# Patient Record
Sex: Female | Born: 1940
Health system: Southern US, Community
[De-identification: ages and names within clinical notes are randomized; demographics above are authoritative.]

## PROBLEM LIST (undated history)

## (undated) DIAGNOSIS — T4145XA Adverse effect of unspecified anesthetic, initial encounter: Secondary | ICD-10-CM

## (undated) DIAGNOSIS — E669 Obesity, unspecified: Secondary | ICD-10-CM

## (undated) DIAGNOSIS — G7000 Myasthenia gravis without (acute) exacerbation: Secondary | ICD-10-CM

## (undated) DIAGNOSIS — M179 Osteoarthritis of knee, unspecified: Secondary | ICD-10-CM

## (undated) DIAGNOSIS — Z8701 Personal history of pneumonia (recurrent): Secondary | ICD-10-CM

## (undated) DIAGNOSIS — K589 Irritable bowel syndrome without diarrhea: Secondary | ICD-10-CM

## (undated) DIAGNOSIS — C755 Malignant neoplasm of aortic body and other paraganglia: Secondary | ICD-10-CM

## (undated) DIAGNOSIS — E782 Mixed hyperlipidemia: Secondary | ICD-10-CM

## (undated) DIAGNOSIS — D447 Neoplasm of uncertain behavior of aortic body and other paraganglia: Secondary | ICD-10-CM

## (undated) DIAGNOSIS — K5790 Diverticulosis of intestine, part unspecified, without perforation or abscess without bleeding: Secondary | ICD-10-CM

## (undated) DIAGNOSIS — I10 Essential (primary) hypertension: Secondary | ICD-10-CM

## (undated) DIAGNOSIS — E039 Hypothyroidism, unspecified: Secondary | ICD-10-CM

## (undated) DIAGNOSIS — C4490 Unspecified malignant neoplasm of skin, unspecified: Secondary | ICD-10-CM

## (undated) DIAGNOSIS — I480 Paroxysmal atrial fibrillation: Secondary | ICD-10-CM

## (undated) DIAGNOSIS — G43909 Migraine, unspecified, not intractable, without status migrainosus: Secondary | ICD-10-CM

## (undated) DIAGNOSIS — M549 Dorsalgia, unspecified: Secondary | ICD-10-CM

## (undated) DIAGNOSIS — M1710 Unilateral primary osteoarthritis, unspecified knee: Secondary | ICD-10-CM

## (undated) DIAGNOSIS — N289 Disorder of kidney and ureter, unspecified: Secondary | ICD-10-CM

## (undated) DIAGNOSIS — Z950 Presence of cardiac pacemaker: Secondary | ICD-10-CM

## (undated) DIAGNOSIS — R0600 Dyspnea, unspecified: Secondary | ICD-10-CM

## (undated) DIAGNOSIS — F418 Other specified anxiety disorders: Secondary | ICD-10-CM

## (undated) DIAGNOSIS — G8929 Other chronic pain: Secondary | ICD-10-CM

## (undated) DIAGNOSIS — D649 Anemia, unspecified: Secondary | ICD-10-CM

## (undated) DIAGNOSIS — K219 Gastro-esophageal reflux disease without esophagitis: Secondary | ICD-10-CM

## (undated) DIAGNOSIS — T8859XA Other complications of anesthesia, initial encounter: Secondary | ICD-10-CM

## (undated) DIAGNOSIS — R110 Nausea: Secondary | ICD-10-CM

## (undated) DIAGNOSIS — I4891 Unspecified atrial fibrillation: Secondary | ICD-10-CM

## (undated) DIAGNOSIS — I517 Cardiomegaly: Secondary | ICD-10-CM

## (undated) HISTORY — DX: Myasthenia gravis without (acute) exacerbation: G70.00

## (undated) HISTORY — DX: Cardiomegaly: I51.7

## (undated) HISTORY — DX: Unspecified atrial fibrillation: I48.91

## (undated) HISTORY — PX: KNEE SURGERY: SHX244

## (undated) HISTORY — DX: Osteoarthritis of knee, unspecified: M17.9

## (undated) HISTORY — PX: CHOLECYSTECTOMY: SHX55

## (undated) HISTORY — PX: ABDOMINAL HYSTERECTOMY: SUR658

## (undated) HISTORY — PX: TOTAL KNEE ARTHROPLASTY: SHX125

## (undated) HISTORY — DX: Irritable bowel syndrome, unspecified: K58.9

## (undated) HISTORY — DX: Migraine, unspecified, not intractable, without status migrainosus: G43.909

## (undated) HISTORY — DX: Mixed hyperlipidemia: E78.2

## (undated) HISTORY — PX: NASAL SINUS SURGERY: SHX719

## (undated) HISTORY — PX: ABDOMINAL HYSTERECTOMY: SHX81

## (undated) HISTORY — DX: Malignant neoplasm of aortic body and other paraganglia: C75.5

## (undated) HISTORY — DX: Hypothyroidism, unspecified: E03.9

## (undated) HISTORY — DX: Presence of cardiac pacemaker: Z95.0

## (undated) HISTORY — PX: CARDIAC CATHETERIZATION: SHX172

## (undated) HISTORY — DX: Unilateral primary osteoarthritis, unspecified knee: M17.10

---

## 1998-12-24 ENCOUNTER — Inpatient Hospital Stay (HOSPITAL_COMMUNITY): Admission: EM | Admit: 1998-12-24 | Discharge: 1998-12-25 | Payer: Self-pay | Admitting: *Deleted

## 2000-08-23 ENCOUNTER — Ambulatory Visit (HOSPITAL_COMMUNITY): Admission: RE | Admit: 2000-08-23 | Discharge: 2000-08-23 | Payer: Self-pay | Admitting: Pediatrics

## 2000-08-23 ENCOUNTER — Encounter: Payer: Self-pay | Admitting: Pediatrics

## 2000-10-01 ENCOUNTER — Ambulatory Visit (HOSPITAL_COMMUNITY): Admission: RE | Admit: 2000-10-01 | Discharge: 2000-10-01 | Payer: Self-pay | Admitting: Pediatrics

## 2000-10-18 ENCOUNTER — Encounter: Payer: Self-pay | Admitting: Pediatrics

## 2000-10-18 ENCOUNTER — Ambulatory Visit (HOSPITAL_COMMUNITY): Admission: RE | Admit: 2000-10-18 | Discharge: 2000-10-18 | Payer: Self-pay | Admitting: Pediatrics

## 2000-11-02 ENCOUNTER — Ambulatory Visit (HOSPITAL_COMMUNITY): Admission: RE | Admit: 2000-11-02 | Discharge: 2000-11-02 | Payer: Self-pay | Admitting: Orthopedic Surgery

## 2000-11-13 ENCOUNTER — Encounter (HOSPITAL_COMMUNITY): Admission: RE | Admit: 2000-11-13 | Discharge: 2000-12-13 | Payer: Self-pay | Admitting: Orthopedic Surgery

## 2000-12-14 ENCOUNTER — Encounter (HOSPITAL_COMMUNITY): Admission: RE | Admit: 2000-12-14 | Discharge: 2001-01-13 | Payer: Self-pay | Admitting: Orthopedic Surgery

## 2001-02-13 ENCOUNTER — Encounter: Payer: Self-pay | Admitting: Otolaryngology

## 2001-02-13 ENCOUNTER — Ambulatory Visit (HOSPITAL_COMMUNITY): Admission: RE | Admit: 2001-02-13 | Discharge: 2001-02-13 | Payer: Self-pay | Admitting: Otolaryngology

## 2002-09-19 ENCOUNTER — Ambulatory Visit (HOSPITAL_COMMUNITY): Admission: RE | Admit: 2002-09-19 | Discharge: 2002-09-19 | Payer: Self-pay | Admitting: Internal Medicine

## 2002-12-17 ENCOUNTER — Encounter: Payer: Self-pay | Admitting: Pediatrics

## 2002-12-17 ENCOUNTER — Ambulatory Visit (HOSPITAL_COMMUNITY): Admission: RE | Admit: 2002-12-17 | Discharge: 2002-12-17 | Payer: Self-pay | Admitting: Pediatrics

## 2003-05-29 ENCOUNTER — Ambulatory Visit (HOSPITAL_COMMUNITY): Admission: RE | Admit: 2003-05-29 | Discharge: 2003-05-29 | Payer: Self-pay | Admitting: Pediatrics

## 2003-09-16 ENCOUNTER — Inpatient Hospital Stay (HOSPITAL_COMMUNITY): Admission: RE | Admit: 2003-09-16 | Discharge: 2003-09-18 | Payer: Self-pay | Admitting: Orthopedic Surgery

## 2003-09-18 ENCOUNTER — Inpatient Hospital Stay (HOSPITAL_COMMUNITY)
Admission: RE | Admit: 2003-09-18 | Discharge: 2003-09-26 | Payer: Self-pay | Admitting: Physical Medicine & Rehabilitation

## 2003-10-06 ENCOUNTER — Inpatient Hospital Stay (HOSPITAL_COMMUNITY): Admission: RE | Admit: 2003-10-06 | Discharge: 2003-10-07 | Payer: Self-pay | Admitting: Pediatrics

## 2003-10-30 ENCOUNTER — Ambulatory Visit (HOSPITAL_COMMUNITY): Admission: RE | Admit: 2003-10-30 | Discharge: 2003-10-30 | Payer: Self-pay | Admitting: Pediatrics

## 2004-02-16 ENCOUNTER — Ambulatory Visit (HOSPITAL_COMMUNITY): Admission: RE | Admit: 2004-02-16 | Discharge: 2004-02-16 | Payer: Self-pay | Admitting: Family Medicine

## 2004-07-21 ENCOUNTER — Ambulatory Visit (HOSPITAL_COMMUNITY): Admission: RE | Admit: 2004-07-21 | Discharge: 2004-07-21 | Payer: Self-pay | Admitting: Pediatrics

## 2005-01-10 ENCOUNTER — Ambulatory Visit (HOSPITAL_COMMUNITY): Admission: RE | Admit: 2005-01-10 | Discharge: 2005-01-10 | Payer: Self-pay | Admitting: Family Medicine

## 2007-11-20 ENCOUNTER — Ambulatory Visit (HOSPITAL_COMMUNITY): Admission: RE | Admit: 2007-11-20 | Discharge: 2007-11-20 | Payer: Self-pay | Admitting: Pediatrics

## 2009-07-15 ENCOUNTER — Ambulatory Visit (HOSPITAL_COMMUNITY): Admission: RE | Admit: 2009-07-15 | Discharge: 2009-07-15 | Payer: Self-pay | Admitting: Pediatrics

## 2010-01-17 ENCOUNTER — Inpatient Hospital Stay (HOSPITAL_COMMUNITY): Admission: RE | Admit: 2010-01-17 | Discharge: 2010-01-22 | Payer: Self-pay | Admitting: Orthopedic Surgery

## 2010-05-31 LAB — CBC
HCT: 27.6 % — ABNORMAL LOW (ref 36.0–46.0)
Hemoglobin: 8.7 g/dL — ABNORMAL LOW (ref 12.0–15.0)
Hemoglobin: 9.8 g/dL — ABNORMAL LOW (ref 12.0–15.0)
MCH: 33 pg (ref 26.0–34.0)
MCH: 33 pg (ref 26.0–34.0)
MCHC: 34.3 g/dL (ref 30.0–36.0)
MCHC: 34.4 g/dL (ref 30.0–36.0)
MCHC: 34.6 g/dL (ref 30.0–36.0)
Platelets: 138 10*3/uL — ABNORMAL LOW (ref 150–400)
Platelets: 151 10*3/uL (ref 150–400)
RDW: 13 % (ref 11.5–15.5)
WBC: 11.1 10*3/uL — ABNORMAL HIGH (ref 4.0–10.5)
WBC: 8.2 10*3/uL (ref 4.0–10.5)

## 2010-05-31 LAB — PROTIME-INR
INR: 1.12 (ref 0.00–1.49)
INR: 1.85 — ABNORMAL HIGH (ref 0.00–1.49)
INR: 2.28 — ABNORMAL HIGH (ref 0.00–1.49)
Prothrombin Time: 14.6 seconds (ref 11.6–15.2)
Prothrombin Time: 17.6 seconds — ABNORMAL HIGH (ref 11.6–15.2)
Prothrombin Time: 21.5 seconds — ABNORMAL HIGH (ref 11.6–15.2)
Prothrombin Time: 25.3 seconds — ABNORMAL HIGH (ref 11.6–15.2)
Prothrombin Time: 25.4 seconds — ABNORMAL HIGH (ref 11.6–15.2)

## 2010-05-31 LAB — BASIC METABOLIC PANEL
BUN: 13 mg/dL (ref 6–23)
CO2: 27 mEq/L (ref 19–32)
Chloride: 107 mEq/L (ref 96–112)
Creatinine, Ser: 1.03 mg/dL (ref 0.4–1.2)
GFR calc Af Amer: >60 mL/min (ref >60)
Glucose, Bld: 133 mg/dL — ABNORMAL HIGH (ref 70–99)
Potassium: 4.2 mEq/L (ref 3.5–5.1)
Potassium: 5 mEq/L (ref 3.5–5.1)
Sodium: 138 mEq/L (ref 135–145)

## 2010-06-01 LAB — CBC
MCH: 32.6 pg (ref 26.0–34.0)
Platelets: 169 10*3/uL (ref 150–400)
RBC: 3.73 MIL/uL — ABNORMAL LOW (ref 3.87–5.11)
WBC: 5.7 10*3/uL (ref 4.0–10.5)

## 2010-06-01 LAB — COMPREHENSIVE METABOLIC PANEL
Albumin: 3.5 g/dL (ref 3.5–5.2)
CO2: 25 mEq/L (ref 19–32)
GFR calc Af Amer: 54 mL/min — ABNORMAL LOW (ref >60)
Glucose, Bld: 95 mg/dL (ref 70–99)
Potassium: 5 mEq/L (ref 3.5–5.1)
Sodium: 138 mEq/L (ref 135–145)

## 2010-06-01 LAB — URINALYSIS, ROUTINE W REFLEX MICROSCOPIC
Glucose, UA: NEGATIVE mg/dL
Hgb urine dipstick: NEGATIVE
Ketones, ur: NEGATIVE mg/dL
Protein, ur: NEGATIVE mg/dL

## 2010-06-01 LAB — SURGICAL PCR SCREEN: MRSA, PCR: NEGATIVE

## 2010-06-01 LAB — TYPE AND SCREEN: Antibody Screen: NEGATIVE

## 2010-06-01 LAB — PROTIME-INR: INR: 0.99 (ref 0.00–1.49)

## 2010-07-26 ENCOUNTER — Other Ambulatory Visit: Payer: Self-pay | Admitting: Family Medicine

## 2010-08-05 NOTE — Op Note (Signed)
NAMEPICCOLA, ARICO                           ACCOUNT NO.:  [id number]   MEDICAL RECORD NO.:  [id number]                   PATIENT TYPE:  INP   LOCATION:  0007                                 FACILITY:  [facility]   PHYSICIAN:  Ollen Gross, M.D.                 DATE OF BIRTH:  Aug 01, 1940   DATE OF PROCEDURE:  09/16/2003  DATE OF DISCHARGE:                                 OPERATIVE REPORT   PREOPERATIVE DIAGNOSIS:  Osteoarthritis of left knee.   POSTOPERATIVE DIAGNOSIS:  Osteoarthritis of left knee.   PROCEDURE:  Left total knee arthroplasty.   SURGEON:  Gus Rankin. Aluisio, M.D.   ASSISTANT:  Avel Peace, PA-C   ANESTHESIA:  General with postop Marcaine pain pump.   ESTIMATED BLOOD LOSS:  300.   DRAIN:  Hemovac x 1.   TOURNIQUET TIME:  40 minutes at 350 mmHg.   COMPLICATIONS:  None.   CONDITION:  Stable to recovery.   BRIEF CLINICAL NOTE:  [name] is a 70 year old female with end-stage  arthritis of the left knee with pain refractory to nonoperative management.  She presents now for left total knee arthroplasty.   PROCEDURE IN DETAIL:  After the successful administration of general  anesthetic, a tourniquet is placed high on the left thigh and left lower  extremity prepped and draped in the usual sterile fashion.  Extremity is  wrapped in Esmarch, knee flexed, tourniquet inflated to 350 mmHg.  Standard  midline incision is made with a 10 blade through subcutaneous tissue to the  level of the extensor mechanism.  A fresh blade is used to make a medial  parapatellar arthrotomy.  Then the soft tissue over the proximal and medial  tibia is subperiosteally elevated to the joint line with a knife and to the  semimembranous bursa with a curved osteotome.  Soft tissue over the proximal  and lateral tibia is elevated with attention being paid to avoid the  patellar tendon on tibial tubercle.  Patella is everted, knee flexed to 90  degrees, and ACL and PCL are removed.  Drill is  used to create a starting  hole in the distal femur and canal is irrigated.  A 5-degree left valgus  alignment guide is placed, then referencing off the posterior condyles,  rotation is marked and a block pinned to remove 10 mm off the distal femur.  Distal femoral resection is made with an oscillating saw.  Sizing block is  placed, and size 3 is most appropriate.  Rotation is marked off the  epicondylar axis.  The size 3 cutting block is placed and the anterior and  posterior cuts made.   The tibia is then subluxed forward, and the menisci are removed.  Extramedullary tibial alignment guide is placed referencing proximally at  the medial aspect of the tibial tubercle and distally along the second  metatarsal axis and tibial crest.  The block  is pinned to remove  approximately 6 mm off the more deficient lateral side.  Tibial resection is  made with an oscillating saw.  Size 3 is the most appropriate tibial  component, and then the proximal tibia is prepared with the modular drill  and keel punch.  Femoral preparation is completed with the intercondylar and  chamfer cuts.   The trial size 3 posterior stabilized femur, size 3 mobile bearing tibial  tray, and a 10 mm posterior stabilized rotating platform insert trial are  placed.  With the 10, full extension is achieved with excellent varus and  valgus balance throughout full range of motion.  Patella is then everted,  thickness measured to be 23 mm, free hand resection taken to 13 mm.  The 38  template placed, lug holes drilled, trial patella is placed, and it tracks  normally.  Osteophytes are then removed off the posterior femur with the  trials in place.  The cut bone surface is then prepared with pulsatile  lavage and cement mixed.  Once ready for implantation, the size 3 mobile  bearing tibial tray, size 3 posterior stabilized femur, and 38 patella are  cemented into place.  Patella is held with a clamp.  Trial 10 mm insert is   placed and knee held in full extension.  Once the cement is fully hardened,  then the permanent 10 mm posterior stabilized rotating platform insert is  placed into the tibial tray.  Once again, there is excellent varus and  valgus balance throughout full range of motion.  The wound is copiously  irrigated with antibiotic solution and the tourniquet released for a total  time of 40 minutes.  The extensor mechanism is closed over a Hemovac drain  with interrupted #1 PDS.  Flexion against gravity is 120 degrees.  Subcu is  closed with interrupted 2-0 Vicryl, subcuticular running 4-0 Monocryl.  The  catheter for the Marcaine pain pump is then placed.  The pain pump is  initiated.  Steri-Strips and a bulky sterile dressing are then applied.  Drain is hooked to suction.  She is placed into a knee immobilizer,  awakened, and transported to recovery in stable condition.                                               Ollen Gross, M.D.    FA/MEDQ  D:  09/16/2003  T:  09/16/2003  Job:  [id number]

## 2010-08-05 NOTE — H&P (Signed)
[name], [name]                           ACCOUNT NO.:  [id number]   MEDICAL RECORD NO.:  [id number]                   PATIENT TYPE:  INP   LOCATION:  0455                                 FACILITY:  [facility]   PHYSICIAN:  Ollen Gross, M.D.                 DATE OF BIRTH:  10-Mar-1941   DATE OF ADMISSION:  09/16/2003  DATE OF DISCHARGE:  09/18/2003                                HISTORY & PHYSICAL   CHIEF COMPLAINT:  Left knee pain.   HISTORY OF PRESENT ILLNESS:  The patient is a 70 year old female seen for  bilateral knee pain.  The left is actually more symptomatic than the right.  She had a long history of bilateral knee pain.  It has been ongoing for  about three years now.  Her knees have reached a point where she is hurting  all the time.  She even has pain at night.  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  She  is seen in the office by Dr. Lequita Halt, and found severe end-stage  tricompartmental arthritis of the left knee with moderate to advanced  changes on the right knee.  She has reached a point now where she would like  to have something done about it.  Risks and benefits have been discussed  with the patient, it was felt like she would benefit from undergoing a knee  replacement.  She is subsequently admitted to the hospital.   ALLERGIES:  1. CODEINE causes a rash.  2. TYLOX causes nausea and vomiting.  3. TETRACYCLINE.  4. BIAXIN.  5. AMPICILLIN.  6. MACRODANTIN.  7. TOBRAMYCIN.   PAST MEDICAL HISTORY:  1. History of migraines.  2. Hypertension.  3. Reflux disease.  4. Gastritis.  5. Hypothyroidism.  6. History of skin cancer.  7. Degenerative joint disease with history of ruptured disk at L4-5.  8. Postmenopausal.  9. History of uterine fibroids.   PAST SURGICAL HISTORY:  1. Hysterectomy.  2. Skin cancer surgery at  [facility].  3. Cholecystectomy.  4. Left knee arthroscopy.   SOCIAL HISTORY:  Married, Designer, jewellery, nonsmoker, rare intake of wine,  has four children.   FAMILY HISTORY:  Father with a history of heart disease, hypertension, and  arthritis.  Mother with a history of hypertension and stroke.   REVIEW OF SYSTEMS:  GENERAL:  No fevers, chills, or night sweats.  NEUROLOGIC:  No seizures, syncope, or paralysis.  RESPIRATORY:  No shortness  of breath, productive cough, or hemoptysis.  CARDIOVASCULAR:  A little bit  of shortness of breath with exertion, no shortness of breath at rest, no  chest pain, angina.  GASTROINTESTINAL:  No nausea, vomiting, diarrhea, or  constipation.  GENITOURINARY:  No dysuria, hematuria, or discharge.  MUSCULOSKELETAL:  Pertinent  to that of the knees found in the history of  present illness.   PHYSICAL EXAMINATION:  VITAL SIGNS:  Pulse 60, respirations 14, blood  pressure 124/85.  GENERAL:  The patient is a 70 year old female, well-developed, well-  nourished, overweight.  She is alert and oriented and cooperative.  HEENT:  Normocephalic, atraumatic.  Pupils equal, round, reactive to light.  EOM's intact.  NECK:  Supple.  CHEST:  Clear to auscultation anterior and posterior chest walls.  No  wheezes, rhonchi, or rales.  HEART:  Regular rhythm, distant sounds, S1 and S2 noted.  ABDOMEN:  Round, protuberant abdomen, bowel sounds are present.  RECTAL:  Not done, not pertinent to present illness.  BREASTS:  Not done, not pertinent to present illness.  GENITALIA:  Not done, not pertinent to present illness.  EXTREMITIES:  Left knee shows a slight varus mal-alignment deformity, range  of motion of 5 to 100 degrees.  Marked crepitus on passive range of motion.  Right knee shows similar examination with range of motion of 5 to 120  degrees with marked crepitus noted.   IMPRESSION:  Osteoarthritis, left knee.   PLAN:  The patient will be admitted to [facility] to undergo a  left total knee replacement arthroplasty.  Surgery will be performed by Dr.  Ollen Gross.     Alexzandrew L. Julien Girt, P.A.              Ollen Gross, M.D.    ALP/MEDQ  D:  10/18/2003  T:  10/18/2003  Job:  [id number]

## 2010-08-05 NOTE — Discharge Summary (Signed)
NAMEIDOLINA, MANTELL                           ACCOUNT NO.:  [id number]   MEDICAL RECORD NO.:  [id number]                   PATIENT TYPE:  INP   LOCATION:  0455                                 FACILITY:  [facility]   PHYSICIAN:  Ollen Gross, M.D.                 DATE OF BIRTH:  January 22, 1941   DATE OF ADMISSION:  09/16/2003  DATE OF DISCHARGE:  09/18/2003                                 DISCHARGE SUMMARY   ADMISSION DIAGNOSES:  1. Osteoarthritis, left knee.  2. History of migraines.  3. Hypertension.  4. Reflux disease.  5. Gastritis.  6. Hypothyroidism.  7. History of skin cancer.  8. Degenerative disk disease with a history of ruptured disk at L4-5.  9. Postmenopausal.  10.      History of uterine fibroids.   DISCHARGE DIAGNOSES:  1. Osteoarthritis, left knee, status post left total knee arthroplasty.  2. History of migraines.  3. Hypertension.  4. Reflux disease.  5. Gastritis.  6. Hypothyroidism.  7. History of skin cancer.  8. Degenerative disk disease with a history of ruptured disk at L4-5.  9. Postmenopausal.  10.      History of uterine fibroids.   REASON FOR ADMISSION:  On the day of surgery, September 16, 2003, left total  knee.  Surgeon was Dr. Ollen Gross, assistant Avel Peace, P.A.-C.  Anesthesia general.  Postoperative Marcaine pain pump.  Estimated blood loss  300 cc.  Hemovac drain x1.  Tourniquet time 40 minutes at 350 mmHg.   HOSPITAL COURSE:  [name] is a 70 year old female with end-stage arthritis  of the left knee.  Pain has been refractory to non-operative management, and  she now presents for total knee arthroplasty.   LABORATORY DATA:  CBC on admission:  Hemoglobin of 14.4, hematocrit of 41.4,  white blood cells 5.7, red blood cell count 4.2, differential within normal  limits.  Postoperative H&H 11.7 and 34.5, last noted H&H 10.3 and 29.8.  PT  and PTT preoperatively were 11.7 and 27, respectively, with an INR of 0.8.  serial prothrombin time's were  followed.  Last noted PT and INR were 15.2,  and 1.3.  Chem panel on admission:  Sodium 134, low, otherwise chem panel  within normal limits.  Serial BMET's were followed.  Electrolytes remained  within normal limits.  Glucose went from 87 to 205, back down to 118.  Urinalysis preoperatively was negative.  Blood group type O positive.  Preoperative EKG dated September 09, 2003, normal sinus rhythm, with occasional  premature ectopic complexes, nonspecific T-wave abnormalities.  The anterior  ST-T abnormality is new since previous tracing.  The inferior ST and T  abnormality has improved, confirmed by Dr. Laqueta Carina.  Preoperative  chest x-ray dated September 09, 2003, no evidence of acute cardiopulmonary  disease.   HOSPITAL COURSE:  The patient was admitted to [facility], taken to  the  OR, underwent the above stated procedure without complications.  The  patient tolerated the procedure well, later transferred to the recovery room  then to the orthopaedic floor for continued postoperative care.  Vital signs  were followed.  Twenty-four postoperative IV antibiotics in the form of  Ancef.  Coumadin for three weeks.  Started on PCA and p.o. analgesic for  pain control following surgery.  Started back on home medications.  Placed  weightbearing as tolerated.  PT and OT were consulted to assist with gait  training, ambulation, and ADL's.  On day #1, had a fairly decent night after  surgery.  Hemovac drain was pulled without difficulty.  Fluids were reduced.  Rehab services were consulted.  The patient was seen in consultation by Dr.  Ellwood Dense and felt to be an appropriate candidate for inpatient stay.  As such, she would be transferred at which time a bed became available.  Continued to receive therapy.  By day #2, she was already starting to get up  a little bit with physical therapy.  Hemoglobin had stabilized.  Dressing  was changed.  Incision was healing well.  PCA and IV's and  Foley's were  discontinued.  It was noted that a bed became available on rehab later that  day, on postoperative day #2.  The patient was doing well, hemoglobin  stable, and she was transferred over to [facility] for continued therapy.   PLAN:  The patient is discharged to [facility].   DISCHARGE DIAGNOSES:  Please see above.   DISCHARGE MEDICATIONS:  1. Percocet for pain.  2. Robaxin for spasm.  3. Coumadin as per pharmacy protocol.  4. Continue her home medications.  MAR to be sent over.   DIET:  As tolerated.  Low sodium diet.   ACTIVITY:  Weightbearing as tolerated.  Continue with gait training,  ambulation, and ADL's as per PT and OT on rehab services for total knee  protocol.   WOUND CARE:  Daily dressing changes.  May start showering after four days  postoperatively.   FOLLOWUP:  Two weeks from the date of surgery or following discharge from  the rehab unit.   DISPOSITION:  [facility].   CONDITION ON DISCHARGE:  Improved.     Alexzandrew L. Julien Girt, P.A.              Ollen Gross, M.D.    ALP/MEDQ  D:  10/21/2003  T:  10/21/2003  Job:  [id number]

## 2010-08-05 NOTE — Discharge Summary (Signed)
[name], [name]                           ACCOUNT NO.:  [id number]   MEDICAL RECORD NO.:  [id number]                   PATIENT TYPE:  IPS   LOCATION:  4147                                 FACILITY:  [facility]   PHYSICIAN:  Ellwood Dense, M.D.                DATE OF BIRTH:  02/12/1941   DATE OF ADMISSION:  09/18/2003  DATE OF DISCHARGE:  09/26/2003                                 DISCHARGE SUMMARY   DISCHARGE DIAGNOSES:  1. Left total knee replacement secondary to degenerative joint disease.  2. Pain management.  3. Coumadin for deep venous thrombosis prophylaxis.  4. Hypertension.  5. History of migraine headaches.  6. Hypothyroidism.  7. Gastroesophageal reflux disease.   HISTORY OF PRESENT ILLNESS:  [age]-year-old white female admitted to  [facility], September 16, 2003, with advanced left knee pain, x-rays  with advanced degenerative joint disease, and no relief with conservative  care.  Underwent a left total knee replacement, September 16, 2003, per Dr. Ollen Gross.  Placed on Coumadin for deep venous thrombosis prophylaxis,  weightbearing as tolerated, hospital course uneventful, pain controlled with  Demerol.  She was admitted for a comprehensive rehab program.   PAST MEDICAL HISTORY:  See discharge diagnoses.   SOCIAL HISTORY:  She lives with husband in [place], independent prior to  admission.  She is a Designer, jewellery at [employer].  One-level  home.  Husband and local family work.  Occasional alcohol.  No tobacco.   MEDICATIONS:  Medications prior to admission were Synthroid, lisinopril,  Inderal and Os-Cal.   ALLERGIES:  PENICILLIN, CODEINE, BIAXIN, TETRACYCLINE, TYLOX and  MACRODANTIN.   HOSPITAL COURSE:  Patient with progressive gains while on rehab services  with therapies initiated on a b.i.d. basis.  The following issues were  followed during the patient's rehab course:  Pertaining to [name]'s left  total knee replacement, surgical  site healing nicely, Steri-Strips in place.  She was supervision-ambulation with a walker, range of motion 88 degrees.  Home health therapies per [facility]; home health services have been arranged.  Pain control with the use of OxyContin sustained release 10 mg, which was  tapered at time of discharge, as well as oxycodone for breakthrough pain.  She remained on Coumadin for deep venous thrombosis prophylaxis, venous  Doppler studies negative, latest INR of 1.9.  [facility] will  continue to check blood-thinning times with results to Dr. Francoise Schaumann. [place],  [phone], fax [phone]; message had been left with his nurse Hilda Lias at time  of discharge.  Her blood pressure remained controlled on home regimens of  lisinopril and Inderal.  She had no bowel or bladder disturbances.  She  continued on her hormone supplement for hypothyroidism.  At time of  discharge, she was close-supervision transfers, ambulating with supervision  75 feet, able to do 1 step at modified independence and a range of motion  on  September 24, 2003 was 102 degrees.  Home health therapies, as noted above, would  be arranged.   Latest labs showed an INR of 1.9, hemoglobin 9.3, hematocrit 26; sodium 135,  potassium 4.2, BUN 15, creatinine 1.0.   DISCHARGE MEDICATIONS:  At time of discharge, medications included:  1. Coumadin with latest dose of 7.5 mg, adjusted accordingly, to be     completed on October 16, 2003.  2. Lisinopril 40 mg daily.  3. Protonix 40 mg daily.  4. Inderal 120 mg daily.  5. Synthroid 125 mcg daily.  6. OxyContin sustained release 10 mg every 12 hours x1 week and discontinue.  7. Trinsicon 1 capsule twice daily.  8. Oxycodone as needed -- pain.   ACTIVITY:  Activity as tolerated.   DIET:  Regular.   WOUND CARE:  Cleanse incision daily with warm water and soap.   SPECIAL INSTRUCTIONS:  Home health nurse per [facility] to check INR  on Monday, September 28, 2003, results to Dr. Vivia Ewing,  [phone], fax [phone].      [name], P.A.                     Ellwood Dense, M.D.    DA/MEDQ  D:  09/24/2003  T:  09/25/2003  Job:  [id number]   cc:   Ellwood Dense, M.D.  [address] [place]  [place] [address]  Fax: [phone]   Ollen Gross, M.D.  [facility]  [address]  [address]  [place]  [place] [address]  Fax: [phone]   Francoise Schaumann. Halm, D.O.  [address], [address]  [place]  [place] [address]  Fax: [phone]

## 2010-08-05 NOTE — H&P (Signed)
[name], [name]                           ACCOUNT NO.:  [id number]   MEDICAL RECORD NO.:  [id number]                   PATIENT TYPE:  INP   LOCATION:  A216                                 FACILITY:  [facility]   PHYSICIAN:  Francoise Schaumann. Halm, D.O.                DATE OF BIRTH:  12/17/1940   DATE OF ADMISSION:  10/06/2003  DATE OF DISCHARGE:                                HISTORY & PHYSICAL   CHIEF COMPLAINT:  Shortness of breath.   BRIEF HISTORY:  The patient is a 70 year old female who is three weeks  status post knee replacement surgery that had been uncomplicated.  He  presents to my office with a one-day history of progressive shortness of  breath and dyspnea on exertion.  She was seen by her orthopedist earlier  today and was advised to see her primary care doctor within the next day.  We saw her early in the afternoon and she appeared to be in moderate  distress with tachypnea, tachycardia, and generalized malaise.  She denies  any significant fever, but has had a couple of episodes of chills and some  very mild nonproductive cough.   I was initially concerned about a pulmonary embolus given her postoperative  status and the immobility of her left knee.  We obtained this stat spiral CT  this afternoon, which did not show any evidence of a pulmonary embolus.  She  is admitted to the hospital for further workup of her tachypnea and  difficulties, as well as oxygen therapy and IV fluids.   PAST MEDICAL HISTORY:  Significant for:  1. Hypothyroidism.  2. Gastrointestinal reflux.  3. Migraine headaches.  4. Hypertension.  5. Fibromyalgia.   ALLERGIES:  1. AMPICILLIN.  2. TETRACYCLINE.  3. MACRODANTIN.  4. BIAXIN.   MEDICATIONS:  1. Inderal LA 120 mg (brand name only) for migraine prevention.  2. Monopril either 20 or 40 mg p.o. daily.  3. Prevacid 30 mg daily.  4. Synthroid 125 mcg daily.  5. Darvocet p.r.n.  6. Flexeril p.r.n.  7. Coumadin 7.5 mg alternating with 5 mg  over the last three weeks     postoperatively.   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  She denies any alcohol or tobacco  use.   FAMILY HISTORY:  Noncontributory.   REVIEW OF SYSTEMS:  The patient denies any significant joint pain.  She had  chronic muscle pains and aches.  She has had a good recovery from her knee  replacement surgery and making good progress in physical therapy.  She has  had no complications from her surgery.  She has a history of degenerative  arthritis, which has progressed especially in the knees.  She has been  fatigued over the last 24 hours and is very easily winded.  Her migraines  have been  well controlled with brand name Inderal.  Her GI reflux symptoms  have been stable.  She has been constipated while she was previously on  oxycodone, but this has improved since she has stopped.   PHYSICAL EXAMINATION:  VITAL SIGNS:  Vital signs were obtained.  The patient  is tachycardic and mildly tachypneic.  GENERAL APPEARANCE:  She appears to be a little bit more comfortable in the  bed at the hospital.  She has oxygen running via nasal cannula.  NECK:  Significant for a normal thyroid and no evidence of JVD.  HEART:  Regular with no ectopy and no murmur.  LUNGS:  Clear anteriorly.  She has bibasilar crackles, which are rather  fine, a little more on her right side.  ABDOMEN:  She has no flank pain.  Her abdomen is soft and nontender.  EXTREMITIES:  No significant edema.  Moderate tenderness to deep palpation  of the muscles and subcutaneous tissues.  She has a very long anterior scar  across her knee, which appears to be healing nicely with no signs of  infection.  GYNECOLOGICAL:  Exam is deferred at this time.  BREASTS:  Exam is deferred at this time.   IMPRESSION AND PLAN:  1. Postoperative dyspnea.  Concern initially was with pulmonary embolus, but     this has been ruled out.   Remaining in the differential includes     pneumonia, atelectasis, or myocardial infarction.  Our plan will be to     admit to the hospital, provide IV fluids, obtain a chest x-ray, start her     on empiric antibiotics, and follow serial cardiac enzymes.  2. History of arthritis, which is stable.  3. History of gastrointestinal reflux, which is stable.  4. History of hypertension, which is stable.   The overall care plan has been reviewed with [name] and she is in  agreement.     ___________________________________________                                         Francoise Schaumann. Halm, D.O.   [facility]/MEDQ  D:  10/06/2003  T:  10/06/2003  Job:  [id number]

## 2010-08-05 NOTE — Op Note (Signed)
[name], [name]                           ACCOUNT NO.:  [id number]   MEDICAL RECORD NO.:  [id number]                   PATIENT TYPE:  AMB   LOCATION:  DAY                                  FACILITY:  [facility]   PHYSICIAN:  Lionel December, M.D.                 DATE OF BIRTH:  02/19/1941   DATE OF PROCEDURE:  09/19/2002  DATE OF DISCHARGE:                                 OPERATIVE REPORT   PROCEDURE:  Total colonoscopy.   INDICATIONS:  [name] is a 70 year old Caucasian female who had rectal  bleeding about 12 days ago which lasted two days.  She had half cupful of  bleeding on at least two different occasions associated with cramps and self-  limiting diarrhea.  She was on Celebrex which was discontinued.  She is  undergoing diagnostic colonoscopy.  Her last colonoscopy was in 1996 and she  removal of a small polyp which is hyperplastic.  Procedure was reviewed and  informed consent was obtained.   PREOPERATIVE MEDICATIONS:  Demerol 50 mg IV, Versed 6 mg IV in divided  doses.   FINDINGS:  The procedure was performed in the endoscopy suite.  The  patient's vital signs and O2 saturation were monitored during the procedure  and remained normal.  The patient was placed in the left lateral recumbent  position.  Rectal examination was performed.  No abnormalities were noted on  the external or digital exam.  Scope was placed in the rectum and advanced  under vision into the sigmoid colon and beyond.  Sigmoid colon was somewhat  redundant.  Preparation was satisfactory.  The scope was passed into the  cecum which was identified by the ileocecal valve and appendiceal orifice.  As the scope was withdrawn, colonic mucosa was once again carefully  examined.  There were a few small diverticula at the left sigmoid colon.  The mucosa was normal throughout.  Rectal mucosa was normal.  Scope was  retroflexed and examined the anorectal junction which was unremarkable.  Endoscope was then withdrawn.   The patient tolerated the procedure well.   FINAL DIAGNOSES:  A few small diverticula at the sigmoid colon; otherwise  normal colonoscopy.  I suspect she could have mild self-limiting colitis to  explain her symptoms which have resolved by now.   RECOMMENDATIONS:  1. High fiber diet.  2. She can resume her Celebrex but at a lower dose if possible.  If she has     another episode of bleeding, will consider small bowel follow-through.                                               Lionel December, M.D.    NR/MEDQ  D:  09/19/2002  T:  09/19/2002  Job:  [id number]

## 2010-10-19 ENCOUNTER — Encounter (INDEPENDENT_AMBULATORY_CARE_PROVIDER_SITE_OTHER): Payer: Self-pay

## 2010-11-16 ENCOUNTER — Encounter (INDEPENDENT_AMBULATORY_CARE_PROVIDER_SITE_OTHER): Payer: Self-pay | Admitting: Internal Medicine

## 2010-11-16 ENCOUNTER — Ambulatory Visit (INDEPENDENT_AMBULATORY_CARE_PROVIDER_SITE_OTHER): Payer: Medicare Other | Admitting: Internal Medicine

## 2010-11-16 VITALS — BP 140/80 | HR 82 | Temp 98.2°F | Ht 65.5 in | Wt 221.3 lb

## 2010-11-16 DIAGNOSIS — R1310 Dysphagia, unspecified: Secondary | ICD-10-CM

## 2010-11-16 DIAGNOSIS — R1314 Dysphagia, pharyngoesophageal phase: Secondary | ICD-10-CM

## 2010-11-16 NOTE — Patient Instructions (Signed)
Soft foods. Will schedule a Modified Barium Swallow

## 2010-11-16 NOTE — Progress Notes (Signed)
Subjective:     Patient ID: [name], female   DOB: January 23, 1941, 70 y.o.   MRN: [id number]  HPI  [name] is a referral from Dr. Webb Laws for liquid dysphagia.  She is not having sold food dysphagia.  She is choking on liquids about twice a day.  She has to cough when this occurs.  Symptoms for 2-3 yrs. Appetite good.  She does not have any trouble eating chicken or meats.  Acid reflux is for the most part controlled with omeprazole.  Weight loss of 50 lbs intentionally. Lower abdominal pain/cramps when she has IBS.  She has a BM about one every other day.   She does tell me she has a hiatal hernia.  Her last colonoscopy was in July of 2004 which revealed a few small diverticula at the sigmoid colon; otherwise normal colonoscopy.  Knee replacement in 2005 and one in 2011 (Both knees)  Past Medical History  Diagnosis Date  . Unspecified essential hypertension   . Mixed hyperlipidemia   . Generalized anxiety disorder   . Obesity, unspecified   . Esophageal reflux   . Hypertension     over 10 yrs  . Hypothyroidism   . Migraines   . Osteoarthritis of knee     both knees  . IBS (irritable colon syndrome)    Past Surgical History  Procedure Date  . Knee surgery   . Total knee arthroplasty     both knee. 2005 1st, 2011 the last one  . Abdominal hysterectomy   . Cholecystectomy   . Nasal sinus surgery     30 yrs ago      Past Surgical History  Procedure Date  . Knee surgery   . Total knee arthroplasty     both knee. 2005 1st, 2011 the last one  . Abdominal hysterectomy   . Cholecystectomy   . Nasal sinus surgery     30 yrs ago   Allergies as of 11/16/2010 - Review Complete 11/16/2010  Allergen Reaction Noted  . Ampicillin  11/16/2010  . Biaxin  11/16/2010  . Clarithromycin  10/19/2010  . Nitrofurantoin  10/19/2010  . Tetracyclines & related  10/19/2010  . Penicillins Rash 10/19/2010   Allergies  Allergen Reactions  . Ampicillin   . Biaxin   . Clarithromycin   .  Nitrofurantoin   . Tetracyclines & Related   . Penicillins Rash    Family History  Problem Relation Age of Onset  . Inflammatory bowel disease Maternal Grandmother    History   Social History  . Marital Status: Divorced    Spouse Name: N/A    Number of Children: N/A  . Years of Education: N/A   Occupational History  . Not on file.   Social History Main Topics  . Smoking status: Never Smoker   . Smokeless tobacco: Not on file  . Alcohol Use: Yes     one glass wine two to three times a year  . Drug Use: No  . Sexually Active: Not on file   Other Topics Concern  . Not on file   Social History Narrative  . No narrative on file   History   Social History Narrative  . No narrative on file   Family Status  Relation Status Death Age  . Mother Deceased     CVA  . Father Deceased     MI  . Sister Alive     good health  . Child Alive  Two have DM, One good health, One has had 2 strokes and an MI.  One has had 3 strokes.     Review of Systems  See hpi.     Objective:   Physical ExamBlood pressure 140/80, pulse 82, temperature 98.2 F (36.8 C), height 5' 5.5" (1.664 m), weight 221 lb 4.8 oz (100.381 kg).  Alert and oriented. Skin warm and dry. Oral mucosa is moist. Natural teeth in good condition. Sclera anicteric, conjunctivae is pink. Thyroid not enlarged. No cervical lymphadenopathy. Lungs clear. Heart regular rate and rhythm.  Abdomen is soft. Bowel sounds are positive. No hepatomegaly. Abdomen obese. No abdominal masses felt. No tenderness.  No edema to lower extremities. Patient is alert and oriented.     Assessment:    Dysphagia to liquids.  Esophgeal motily disorder needs to be ruled out.    Plan:    Modified barium swallow. Further recommendations once we have the test back I talk with Dr. Karilyn Cota.

## 2010-12-20 ENCOUNTER — Other Ambulatory Visit (INDEPENDENT_AMBULATORY_CARE_PROVIDER_SITE_OTHER): Payer: Self-pay | Admitting: Internal Medicine

## 2010-12-22 ENCOUNTER — Other Ambulatory Visit (INDEPENDENT_AMBULATORY_CARE_PROVIDER_SITE_OTHER): Payer: Self-pay | Admitting: *Deleted

## 2010-12-22 ENCOUNTER — Ambulatory Visit (HOSPITAL_COMMUNITY): Payer: Medicare Other

## 2010-12-22 MED ORDER — OMEPRAZOLE 40 MG PO CPDR: 40.0000 mg | DELAYED_RELEASE_CAPSULE | Freq: Two times a day (BID) | ORAL | Status: DC

## 2010-12-22 NOTE — Telephone Encounter (Signed)
Raul Del called in reference to her Mom.  She had gotten a call earlier and her mom does not have voice mail.  You can try to call her at [phone] or [name]

## 2010-12-27 ENCOUNTER — Ambulatory Visit (HOSPITAL_COMMUNITY): Payer: Medicare Other

## 2010-12-27 ENCOUNTER — Ambulatory Visit (HOSPITAL_COMMUNITY): Payer: Medicare Other | Admitting: Speech Pathology

## 2011-01-03 ENCOUNTER — Other Ambulatory Visit (INDEPENDENT_AMBULATORY_CARE_PROVIDER_SITE_OTHER): Payer: Self-pay | Admitting: Internal Medicine

## 2011-01-09 ENCOUNTER — Telehealth (INDEPENDENT_AMBULATORY_CARE_PROVIDER_SITE_OTHER): Payer: Self-pay | Admitting: *Deleted

## 2011-01-09 NOTE — Telephone Encounter (Signed)
She called Friday at 3:06 pm  She has a question about the number on her bottle for the amount on her RX

## 2011-02-14 ENCOUNTER — Other Ambulatory Visit (HOSPITAL_COMMUNITY): Payer: Self-pay | Admitting: Pediatrics

## 2011-02-14 DIAGNOSIS — Z139 Encounter for screening, unspecified: Secondary | ICD-10-CM

## 2011-02-16 ENCOUNTER — Ambulatory Visit (HOSPITAL_COMMUNITY): Payer: Medicare Other

## 2011-02-17 ENCOUNTER — Ambulatory Visit (HOSPITAL_COMMUNITY)
Admission: RE | Admit: 2011-02-17 | Discharge: 2011-02-17 | Disposition: A | Discharge status: Home / Self Care | Payer: Medicare Other | Source: Ambulatory Visit | Attending: Pediatrics | Admitting: Pediatrics

## 2011-02-17 DIAGNOSIS — Z139 Encounter for screening, unspecified: Secondary | ICD-10-CM

## 2011-02-17 DIAGNOSIS — Z1231 Encounter for screening mammogram for malignant neoplasm of breast: Secondary | ICD-10-CM | POA: Insufficient documentation

## 2011-03-24 ENCOUNTER — Other Ambulatory Visit (HOSPITAL_COMMUNITY): Payer: Self-pay | Admitting: Pediatrics

## 2011-03-24 DIAGNOSIS — E559 Vitamin D deficiency, unspecified: Secondary | ICD-10-CM | POA: Diagnosis not present

## 2011-03-24 DIAGNOSIS — I10 Essential (primary) hypertension: Secondary | ICD-10-CM | POA: Diagnosis not present

## 2011-03-24 DIAGNOSIS — M810 Age-related osteoporosis without current pathological fracture: Secondary | ICD-10-CM | POA: Diagnosis not present

## 2011-03-24 DIAGNOSIS — E785 Hyperlipidemia, unspecified: Secondary | ICD-10-CM | POA: Diagnosis not present

## 2011-03-24 DIAGNOSIS — Z23 Encounter for immunization: Secondary | ICD-10-CM | POA: Diagnosis not present

## 2011-03-29 ENCOUNTER — Ambulatory Visit (HOSPITAL_COMMUNITY)
Admission: RE | Admit: 2011-03-29 | Discharge: 2011-03-29 | Disposition: A | Discharge status: Home / Self Care | Payer: Medicare Other | Source: Ambulatory Visit | Attending: Pediatrics | Admitting: Pediatrics

## 2011-03-29 DIAGNOSIS — Z780 Asymptomatic menopausal state: Secondary | ICD-10-CM | POA: Diagnosis not present

## 2011-03-29 DIAGNOSIS — M810 Age-related osteoporosis without current pathological fracture: Secondary | ICD-10-CM | POA: Diagnosis not present

## 2011-03-31 DIAGNOSIS — Z23 Encounter for immunization: Secondary | ICD-10-CM | POA: Diagnosis not present

## 2011-07-05 DIAGNOSIS — H538 Other visual disturbances: Secondary | ICD-10-CM | POA: Diagnosis not present

## 2011-07-05 DIAGNOSIS — H532 Diplopia: Secondary | ICD-10-CM | POA: Diagnosis not present

## 2011-07-05 DIAGNOSIS — H2510 Age-related nuclear cataract, unspecified eye: Secondary | ICD-10-CM | POA: Diagnosis not present

## 2011-07-21 DIAGNOSIS — R634 Abnormal weight loss: Secondary | ICD-10-CM | POA: Diagnosis not present

## 2011-07-21 DIAGNOSIS — G8929 Other chronic pain: Secondary | ICD-10-CM | POA: Diagnosis not present

## 2011-07-26 ENCOUNTER — Encounter (INDEPENDENT_AMBULATORY_CARE_PROVIDER_SITE_OTHER): Payer: Self-pay | Admitting: Internal Medicine

## 2011-07-26 ENCOUNTER — Telehealth (INDEPENDENT_AMBULATORY_CARE_PROVIDER_SITE_OTHER): Payer: Self-pay | Admitting: *Deleted

## 2011-07-26 ENCOUNTER — Ambulatory Visit (INDEPENDENT_AMBULATORY_CARE_PROVIDER_SITE_OTHER): Payer: Medicare Other | Admitting: Internal Medicine

## 2011-07-26 ENCOUNTER — Other Ambulatory Visit (INDEPENDENT_AMBULATORY_CARE_PROVIDER_SITE_OTHER): Payer: Self-pay | Admitting: *Deleted

## 2011-07-26 VITALS — BP 148/98 | HR 64 | Temp 98.4°F | Ht 64.0 in | Wt 206.9 lb

## 2011-07-26 DIAGNOSIS — K625 Hemorrhage of anus and rectum: Secondary | ICD-10-CM

## 2011-07-26 DIAGNOSIS — E7800 Pure hypercholesterolemia, unspecified: Secondary | ICD-10-CM

## 2011-07-26 DIAGNOSIS — M1990 Unspecified osteoarthritis, unspecified site: Secondary | ICD-10-CM

## 2011-07-26 DIAGNOSIS — M129 Arthropathy, unspecified: Secondary | ICD-10-CM

## 2011-07-26 DIAGNOSIS — K219 Gastro-esophageal reflux disease without esophagitis: Secondary | ICD-10-CM

## 2011-07-26 DIAGNOSIS — E039 Hypothyroidism, unspecified: Secondary | ICD-10-CM | POA: Diagnosis not present

## 2011-07-26 DIAGNOSIS — I10 Essential (primary) hypertension: Secondary | ICD-10-CM | POA: Diagnosis not present

## 2011-07-26 DIAGNOSIS — K589 Irritable bowel syndrome without diarrhea: Secondary | ICD-10-CM

## 2011-07-26 DIAGNOSIS — Z1211 Encounter for screening for malignant neoplasm of colon: Secondary | ICD-10-CM

## 2011-07-26 NOTE — Progress Notes (Signed)
Subjective:     Patient ID: [name], female   DOB: Oct 05, 1940, 71 y.o.   MRN: [id number]  HPI Louis is a 71 yr old female presenting today with c/o rectal bleeding. She tells me she has had abdominal pain for a while. She says she has had really bad abdominal cramps.  She started having diarrhea Saturday. She says the diarrhea was explosive.  She also said she passed mucous, gas and blood. She took Imodium and the diarrhea stopped. The diarrhea lasted about 12 hrs. She continues to have abdominal cramps. Her stools now are orange in color now and has seen red streaks on the toilet tissue. She has had intentional weight loss. Her appetite is good. She is trying to diet.  She has lower abdominal cramps. Now she is having 2 a day, formed. Stools are smaller than usual. She also c/o abdominal distention  Her colonoscopy was greater than 10 yrs ago.    Review of Systems see hpi Current Outpatient Prescriptions  Medication Sig Dispense Refill  . ALPRAZolam (XANAX) 1 MG tablet Take 1 mg by mouth as needed.        . beta carotene w/minerals (OCUVITE) tablet Take 1 tablet by mouth daily.        . bisacodyl (DULCOLAX) 5 MG EC tablet Take 5 mg by mouth daily as needed.        . calcium carbonate (OS-CAL) 600 MG TABS Take 600 mg by mouth 2 (two) times daily with a meal.        . cholecalciferol (VITAMIN D) 1000 UNITS tablet Take 1,000 Units by mouth daily.       . cyclobenzaprine (FLEXERIL) 10 MG tablet Take 10 mg by mouth as needed.       . diclofenac (VOLTAREN) 50 MG EC tablet Take 50 mg by mouth 2 (two) times daily.        Marland Kitchen dicyclomine (BENTYL) 10 MG capsule Take 10 mg by mouth as needed.        . fexofenadine (ALLEGRA) 180 MG tablet Take 180 mg by mouth as needed.       Marland Kitchen glucosamine-chondroitin 500-400 MG tablet Take 1 tablet by mouth 3 (three) times daily.        Marland Kitchen HYDROcodone-acetaminophen (NORCO) 10-325 MG per tablet Take 1 tablet by mouth every 6 (six) hours as needed.        .  lansoprazole (PREVACID) 30 MG capsule Take 30 mg by mouth 2 (two) times daily before a meal.      . levothyroxine (SYNTHROID, LEVOTHROID) 100 MCG tablet Take 100 mcg by mouth daily.        Marland Kitchen lubiprostone (AMITIZA) 24 MCG capsule Take 24 mcg by mouth 2 (two) times daily with a meal.      . Omega-3 Fatty Acids (FISH OIL) 1200 MG CAPS Take by mouth 2 (two) times daily before a meal.       . pravastatin (PRAVACHOL) 80 MG tablet Take 40 mg by mouth daily.       . propranolol (INDERAL) 40 MG tablet Take 40 mg by mouth 3 (three) times daily.        . SUMAtriptan (IMITREX) 50 MG tablet Take 50 mg by mouth every 2 (two) hours as needed.        . vitamin B-12 (CYANOCOBALAMIN) 500 MCG tablet Take 500 mcg by mouth daily.       Marland Kitchen aspirin 81 MG tablet Take 81 mg by mouth daily.        Marland Kitchen  diphenhydrAMINE (SOMINEX) 25 MG tablet Take 25 mg by mouth as needed.        . Ginkgo Biloba 120 MG CAPS Take by mouth.        Marland Kitchen omeprazole (PRILOSEC) 40 MG capsule TAKE 1 CAPSULE TWICE DAILY BEFORE MEALS  30 capsule  11   Past Medical History  Diagnosis Date  . Unspecified essential hypertension   . Mixed hyperlipidemia   . Generalized anxiety disorder   . Obesity, unspecified   . Esophageal reflux   . Hypertension     over 10 yrs  . Hypothyroidism   . Migraines   . Osteoarthritis of knee     both knees  . IBS (irritable colon syndrome)    Past Surgical History  Procedure Date  . Knee surgery   . Total knee arthroplasty     both knee. 2005 1st, 2011 the last one  . Abdominal hysterectomy   . Cholecystectomy   . Nasal sinus surgery     30 yrs ago   History   Social History  . Marital Status: Divorced    Spouse Name: N/A    Number of Children: N/A  . Years of Education: N/A   Occupational History  . Not on file.   Social History Main Topics  . Smoking status: Never Smoker   . Smokeless tobacco: Not on file  . Alcohol Use: Yes     one glass wine two to three times a year  . Drug Use: No  .  Sexually Active: Not on file   Other Topics Concern  . Not on file   Social History Narrative  . No narrative on file   Family Status  Relation Status Death Age  . Mother Deceased     CVA  . Father Deceased     MI  . Sister Alive     good health  . Child Alive     Two have DM, One good health, One has had 2 strokes and an MI.  One has had 3 strokes.   Allergies  Allergen Reactions  . Ampicillin   . Clarithromycin   . Clarithromycin   . Nitrofurantoin   . Tetracyclines & Related   . Penicillins Rash        Objective:   Physical Exam Filed Vitals:   07/26/11 1442  Height: 5\' 4"  (1.626 m)  Weight: 206 lb 14.4 oz (93.849 kg)   Alert and oriented. Skin warm and dry. Oral mucosa is moist.   . Sclera anicteric, conjunctivae is pink. Thyroid not enlarged. No cervical lymphadenopathy. Lungs clear. Heart regular rate and rhythm.  Abdomen is soft. Bowel sounds are positive. No hepatomegaly. No abdominal masses felt. No tenderness.  No edema to lower extremities. Patient is alert and oriented.      Assessment:    Rectal bleeding: which has now resolved. Colonic neoplasm needs to be ruled out as well as a colonic ulcer given her hx of daily ASA and Diclofenac.     Plan:    Colonoscopy with Dr Karilyn Cota.

## 2011-07-26 NOTE — Patient Instructions (Signed)
Reduce the Diclofenac to once a day. Colonoscopy with Dr Karilyn Cota.

## 2011-07-26 NOTE — Telephone Encounter (Signed)
Patient needs movi prep 

## 2011-07-28 MED ORDER — PEG-KCL-NACL-NASULF-NA ASC-C 100 G PO SOLR: 1.0000 | Freq: Once | ORAL | Status: DC

## 2011-08-01 ENCOUNTER — Encounter (INDEPENDENT_AMBULATORY_CARE_PROVIDER_SITE_OTHER): Payer: Self-pay | Admitting: *Deleted

## 2011-08-03 DIAGNOSIS — R109 Unspecified abdominal pain: Secondary | ICD-10-CM | POA: Diagnosis not present

## 2011-08-03 DIAGNOSIS — R197 Diarrhea, unspecified: Secondary | ICD-10-CM | POA: Diagnosis not present

## 2011-08-18 ENCOUNTER — Encounter (HOSPITAL_COMMUNITY): Payer: Self-pay | Admitting: Pharmacy Technician

## 2011-08-24 ENCOUNTER — Ambulatory Visit (HOSPITAL_COMMUNITY)
Admission: RE | Admit: 2011-08-24 | Discharge: 2011-08-24 | Disposition: A | Discharge status: Home / Self Care | Payer: Medicare Other | Source: Ambulatory Visit | Attending: Internal Medicine | Admitting: Internal Medicine

## 2011-08-24 ENCOUNTER — Encounter (HOSPITAL_COMMUNITY)
Admission: RE | Disposition: A | Discharge status: Home / Self Care | Payer: Self-pay | Source: Ambulatory Visit | Attending: Internal Medicine

## 2011-08-24 ENCOUNTER — Encounter (HOSPITAL_COMMUNITY): Payer: Self-pay | Admitting: *Deleted

## 2011-08-24 DIAGNOSIS — D126 Benign neoplasm of colon, unspecified: Secondary | ICD-10-CM | POA: Diagnosis not present

## 2011-08-24 DIAGNOSIS — E669 Obesity, unspecified: Secondary | ICD-10-CM | POA: Insufficient documentation

## 2011-08-24 DIAGNOSIS — K625 Hemorrhage of anus and rectum: Secondary | ICD-10-CM | POA: Insufficient documentation

## 2011-08-24 DIAGNOSIS — K5730 Diverticulosis of large intestine without perforation or abscess without bleeding: Secondary | ICD-10-CM

## 2011-08-24 DIAGNOSIS — E785 Hyperlipidemia, unspecified: Secondary | ICD-10-CM | POA: Diagnosis not present

## 2011-08-24 DIAGNOSIS — I10 Essential (primary) hypertension: Secondary | ICD-10-CM | POA: Insufficient documentation

## 2011-08-24 HISTORY — DX: Other complications of anesthesia, initial encounter: T88.59XA

## 2011-08-24 HISTORY — DX: Adverse effect of unspecified anesthetic, initial encounter: T41.45XA

## 2011-08-24 HISTORY — PX: COLONOSCOPY: SHX5424

## 2011-08-24 SURGERY — COLONOSCOPY
Anesthesia: Moderate Sedation

## 2011-08-24 MED ORDER — MIDAZOLAM HCL 5 MG/5ML IJ SOLN
INTRAMUSCULAR | Status: AC
  Filled 2011-08-24: qty 10

## 2011-08-24 MED ORDER — MEPERIDINE HCL 50 MG/ML IJ SOLN
INTRAMUSCULAR | Status: DC | PRN
Start: 2011-08-24 — End: 2011-08-24
  Administered 2011-08-24 (×3): 25 mg via INTRAVENOUS

## 2011-08-24 MED ORDER — MEPERIDINE HCL 50 MG/ML IJ SOLN
INTRAMUSCULAR | Status: AC
  Filled 2011-08-24: qty 1

## 2011-08-24 MED ORDER — SODIUM CHLORIDE 0.45 % IV SOLN
Freq: Once | INTRAVENOUS | Status: AC
  Administered 2011-08-24: 1000 mL via INTRAVENOUS

## 2011-08-24 MED ORDER — MIDAZOLAM HCL 5 MG/5ML IJ SOLN
INTRAMUSCULAR | Status: DC
Start: 2011-08-24 — End: 2011-08-24
  Filled 2011-08-24: qty 5

## 2011-08-24 MED ORDER — STERILE WATER FOR IRRIGATION IR SOLN
Status: DC | PRN
  Administered 2011-08-24: 14:00:00

## 2011-08-24 MED ORDER — MIDAZOLAM HCL 5 MG/5ML IJ SOLN
INTRAMUSCULAR | Status: DC | PRN
  Administered 2011-08-24 (×6): 2 mg via INTRAVENOUS

## 2011-08-24 NOTE — H&P (Signed)
[name] is an 71 y.o. female.   Chief Complaint: Patient is here for colonoscopy. HPI: Patient is 71 year old Caucasian female who had one day of rectal bleeding associated with diarrhea bloating and cramps. She has history of IBS. Lately she's been constipated and has good results with Amitiza. She she has cut back on diclofenac to once a day she cannot move around the morning and that she takes second dose in the evening. Patient's last colonoscopy was in July 2004 revealing few diverticula at sigmoid colon. Family  history is negative for colorectal carcinoma.  Past Medical History  Diagnosis Date  . Unspecified essential hypertension   . Mixed hyperlipidemia   . Obesity, unspecified   . Esophageal reflux   . Hypertension     over 10 yrs  . Hypothyroidism   . Migraines   . Osteoarthritis of knee     both knees  . IBS (irritable colon syndrome)   . Complication of anesthesia     Low blood pressure, heart rate, O2 sat    Past Surgical History  Procedure Date  . Knee surgery   . Total knee arthroplasty     both knee. 2005 1st, 2011 the last one  . Abdominal hysterectomy   . Cholecystectomy   . Nasal sinus surgery     30 yrs ago    Family History  Problem Relation Age of Onset  . Inflammatory bowel disease Maternal Grandmother    Social History:  reports that she has never smoked. She does not have any smokeless tobacco history on file. She reports that she drinks alcohol. She reports that she does not use illicit drugs.  Allergies:  Allergies  Allergen Reactions  . Clarithromycin Nausea And Vomiting  . Clarithromycin   . Cod (Fish Allergy)   . Codeine     Can not take Codeine unless in cough syrup  . Nitrofurantoin Nausea And Vomiting  . Tetracyclines & Related Nausea And Vomiting  . Ampicillin Rash  . Penicillins Rash    Medications Prior to Admission  Medication Sig Dispense Refill  . ALPRAZolam (XANAX) 1 MG tablet Take 1 mg by mouth 2 (two) times  daily.       Marland Kitchen aspirin 81 MG tablet Take 81 mg by mouth daily.        . beta carotene w/minerals (OCUVITE) tablet Take 1 tablet by mouth daily.        . calcium carbonate (OS-CAL) 600 MG TABS Take 600 mg by mouth 2 (two) times daily with a meal.        . Cholecalciferol (VITAMIN D3) 3000 UNITS TABS Take 3,000 Units by mouth daily.      . cyclobenzaprine (FLEXERIL) 10 MG tablet Take 10 mg by mouth at bedtime as needed. Muscle spasm      . diclofenac (VOLTAREN) 50 MG EC tablet Take 50 mg by mouth 2 (two) times daily.        Marland Kitchen dicyclomine (BENTYL) 10 MG capsule Take 10 mg by mouth as needed. IBS      . glucosamine-chondroitin 500-400 MG tablet Take 1 tablet by mouth 3 (three) times daily.        Marland Kitchen HYDROcodone-acetaminophen (NORCO) 10-325 MG per tablet Take 1 tablet by mouth every 6 (six) hours as needed. pain      . lansoprazole (PREVACID) 30 MG capsule Take 30 mg by mouth 2 (two) times daily before a meal.      . levothyroxine (SYNTHROID, LEVOTHROID) 100 MCG tablet  Take 100 mcg by mouth daily.        Marland Kitchen lubiprostone (AMITIZA) 8 MCG capsule Take 8 mcg by mouth 2 (two) times daily.      . peg 3350 powder (MOVIPREP) 100 G SOLR Take 1 kit (100 g total) by mouth once.  1 kit  0  . pravastatin (PRAVACHOL) 80 MG tablet Take 40 mg by mouth daily.       . propranolol (INDERAL) 40 MG tablet Take 40 mg by mouth 2 (two) times daily.       Marland Kitchen pyridostigmine (MESTINON) 60 MG tablet Take 60 mg by mouth 3 (three) times daily.      . SUMAtriptan (IMITREX) 50 MG tablet Take 50 mg by mouth every 2 (two) hours as needed. headaches      . vitamin B-12 (CYANOCOBALAMIN) 500 MCG tablet Take 500 mcg by mouth daily.       . bisacodyl (DULCOLAX) 5 MG EC tablet Take 5 mg by mouth daily as needed. constipation      . Ginkgo Biloba 120 MG CAPS Take 120 mg by mouth daily.       . Omega-3 Fatty Acids (FISH OIL) 1200 MG CAPS Take by mouth 2 (two) times daily before a meal.       . omeprazole (PRILOSEC) 40 MG capsule TAKE 1 CAPSULE  TWICE DAILY BEFORE MEALS  30 capsule  11    No results found for this or any previous visit (from the past 48 hour(s)). No results found.  ROS  Blood pressure 137/74, pulse 57, temperature 97.7 F (36.5 C), temperature source Oral, resp. rate 20, SpO2 100.00%. Physical Exam  Constitutional: She appears well-developed and well-nourished.  HENT:  Mouth/Throat: Oropharynx is clear and moist.  Eyes: Conjunctivae are normal. No scleral icterus.  Neck: No thyromegaly present.  Cardiovascular: Normal rate, regular rhythm and normal heart sounds.   No murmur heard. Respiratory: Effort normal and breath sounds normal.  GI: Soft. She exhibits no distension and no mass. There is no tenderness.  Musculoskeletal: She exhibits no edema.  Lymphadenopathy:    She has no cervical adenopathy.  Neurological: She is alert.  Skin: Skin is warm.     Assessment/Plan Rectal bleeding. Diagnostic colonoscopy.  Berkleigh Beckles U 08/24/2011, 1:50 PM

## 2011-08-24 NOTE — Discharge Instructions (Signed)
Resume usual medications. High fiber diet. No driving for 24 hours. Physician will contact you with biopsy results.   Colonoscopy Care After Read the instructions outlined below and refer to this sheet in the next few weeks. These discharge instructions provide you with general information on caring for yourself after you leave the hospital. Your doctor may also give you specific instructions. While your treatment has been planned according to the most current medical practices available, unavoidable complications occasionally occur. If you have any problems or questions after discharge, call your doctor. HOME CARE INSTRUCTIONS ACTIVITY:  You may resume your regular activity, but move at a slower pace for the next 24 hours.   Take frequent rest periods for the next 24 hours.   Walking will help get rid of the air and reduce the bloated feeling in your belly (abdomen).   No driving for 24 hours (because of the medicine (anesthesia) used during the test).   You may shower.   Do not sign any important legal documents or operate any machinery for 24 hours (because of the anesthesia used during the test).  NUTRITION:  Drink plenty of fluids.   You may resume your normal diet as instructed by your doctor.   Begin with a light meal and progress to your normal diet. Heavy or fried foods are harder to digest and may make you feel sick to your stomach (nauseated).   Avoid alcoholic beverages for 24 hours or as instructed.  MEDICATIONS:  You may resume your normal medications unless your doctor tells you otherwise.  WHAT TO EXPECT TODAY:  Some feelings of bloating in the abdomen.   Passage of more gas than usual.   Spotting of blood in your stool or on the toilet paper.  IF YOU HAD POLYPS REMOVED DURING THE COLONOSCOPY:  No aspirin products for 7 days or as instructed.   No alcohol for 7 days or as instructed.   Eat a soft diet for the next 24 hours.  FINDING OUT THE RESULTS OF  YOUR TEST Not all test results are available during your visit. If your test results are not back during the visit, make an appointment with your caregiver to find out the results. Do not assume everything is normal if you have not heard from your caregiver or the medical facility. It is important for you to follow up on all of your test results.  SEEK IMMEDIATE MEDICAL CARE IF:  You have more than a spotting of blood in your stool.   Your belly is swollen (abdominal distention).   You are nauseated or vomiting.   You have a fever.   You have abdominal pain or discomfort that is severe or gets worse throughout the day.  Document Released: 10/19/2003 Document Revised: 02/23/2011 Document Reviewed: 10/17/2007 [facility] Patient Information 2012 [place], [place].    PATIENT INSTRUCTIONS POST-ANESTHESIA  IMMEDIATELY FOLLOWING SURGERY:  Do not drive or operate machinery for the first twenty four hours after surgery.  Do not make any important decisions for twenty four hours after surgery or while taking narcotic pain medications or sedatives.  If you develop intractable nausea and vomiting or a severe headache please notify your doctor immediately.  FOLLOW-UP:  Please make an appointment with your surgeon as instructed. You do not need to follow up with anesthesia unless specifically instructed to do so.  WOUND CARE INSTRUCTIONS (if applicable):  Keep a dry clean dressing on the anesthesia/puncture wound site if there is drainage.  Once the wound has  quit draining you may leave it open to air.  Generally you should leave the bandage intact for twenty four hours unless there is drainage.  If the epidural site drains for more than 36-48 hours please call the anesthesia department.  QUESTIONS?:  Please feel free to call your physician or the hospital operator if you have any questions, and they will be happy to assist you.

## 2011-08-24 NOTE — Op Note (Signed)
COLONOSCOPY PROCEDURE REPORT  PATIENT:  [name]  MR#:  [id number] Birthdate:  [birth year], 71 y.o., female Endoscopist:  Dr. Malissa Hippo, MD Referred By:  Dr. Vivia Ewing, MD Procedure Date: 08/24/2011  Procedure:   Colonoscopy  Indications: Patient is 71 year old Caucasian female with multiple medical problems who is on chronic NSAID therapy experienced single day of rectal bleeding which has now resolved. Patient's last colonoscopy was in July 2004.  Informed Consent:  The procedure and risks were reviewed with the patient and informed consent was obtained.  Medications:  Demerol 75 mg IV Versed 12 mg IV  Description of procedure:  After a digital rectal exam was performed, that colonoscope was advanced from the anus through the rectum and colon to the area of the cecum, ileocecal valve and appendiceal orifice. The cecum was deeply intubated. These structures were well-seen and photographed for the record. From the level of the cecum and ileocecal valve, the scope was slowly and cautiously withdrawn. The mucosal surfaces were carefully surveyed utilizing scope tip to flexion to facilitate fold flattening as needed. The scope was pulled down into the rectum where a thorough exam including retroflexion was performed.  Findings:   Prep excellent. Moderate number of diverticula at sigmoid colon. Two small polyps ablated via cold biopsy. These are submitted together. One was located at splenic flexure and the second one at transverse colon. Normal rectal mucosa and anorectal junction  Therapeutic/Diagnostic Maneuvers Performed:  See  Complications:  None  Cecal Withdrawal Time:  8 minutes  Impression:  Examination performed to cecum. Sigmoid colon diverticulosis. Two small polyps ablated via cold biopsy and submitted together(splenic flexure and transverse colon).  Recommendations:  Standard instructions given. I will contact patient with biopsy results.  Debora Stockdale U   08/24/2011 2:43 PM  CC: Dr. Vivia Ewing, MD, MD & Dr. Bonnetta Barry ref. provider found

## 2011-08-29 ENCOUNTER — Ambulatory Visit (INDEPENDENT_AMBULATORY_CARE_PROVIDER_SITE_OTHER): Payer: Medicare Other | Admitting: Internal Medicine

## 2011-08-29 ENCOUNTER — Encounter (HOSPITAL_COMMUNITY): Payer: Self-pay | Admitting: Internal Medicine

## 2011-08-30 DIAGNOSIS — G7000 Myasthenia gravis without (acute) exacerbation: Secondary | ICD-10-CM | POA: Diagnosis not present

## 2011-08-30 DIAGNOSIS — H532 Diplopia: Secondary | ICD-10-CM | POA: Diagnosis not present

## 2011-09-04 ENCOUNTER — Encounter (INDEPENDENT_AMBULATORY_CARE_PROVIDER_SITE_OTHER): Payer: Self-pay | Admitting: *Deleted

## 2011-09-26 ENCOUNTER — Encounter (INDEPENDENT_AMBULATORY_CARE_PROVIDER_SITE_OTHER): Payer: Self-pay | Admitting: Internal Medicine

## 2011-09-26 ENCOUNTER — Ambulatory Visit (INDEPENDENT_AMBULATORY_CARE_PROVIDER_SITE_OTHER): Payer: Medicare Other | Admitting: Internal Medicine

## 2011-09-26 VITALS — BP 120/80 | HR 68 | Temp 97.7°F | Resp 20 | Ht 64.0 in | Wt 207.3 lb

## 2011-09-26 DIAGNOSIS — R141 Gas pain: Secondary | ICD-10-CM | POA: Diagnosis not present

## 2011-09-26 DIAGNOSIS — R1031 Right lower quadrant pain: Secondary | ICD-10-CM

## 2011-09-26 DIAGNOSIS — R140 Abdominal distension (gaseous): Secondary | ICD-10-CM

## 2011-09-26 DIAGNOSIS — R143 Flatulence: Secondary | ICD-10-CM

## 2011-09-26 DIAGNOSIS — Z0189 Encounter for other specified special examinations: Secondary | ICD-10-CM | POA: Diagnosis not present

## 2011-09-26 DIAGNOSIS — R1032 Left lower quadrant pain: Secondary | ICD-10-CM | POA: Insufficient documentation

## 2011-09-26 MED ORDER — ALIGN PO CAPS: 1.0000 | ORAL_CAPSULE | Freq: Every day | ORAL | Status: AC

## 2011-09-26 MED ORDER — DOCUSATE SODIUM 100 MG PO CAPS: 100.0000 mg | ORAL_CAPSULE | Freq: Two times a day (BID) | ORAL | Status: AC

## 2011-09-26 MED ORDER — DICYCLOMINE HCL 10 MG PO CAPS: 10.0000 mg | ORAL_CAPSULE | Freq: Two times a day (BID) | ORAL | Status: DC

## 2011-09-26 NOTE — Patient Instructions (Addendum)
Try to take 1 dose of diclofenac daily if possible. Discontinue Amitiza. Colace(stool softener) 1 capsule by mouth twice daily. Continue fiber supplement 3-4 g daily. Align 1 capsule by mouth daily Take dicyclomine 10 mg by mouth before breakfast and lunch. Can use glycerin or Dulcolax suppository on as-needed basis. Physician will contact you with results of CT when completed.

## 2011-09-26 NOTE — Progress Notes (Signed)
Presenting complaint;  Lower abdominal pain, bloating and urge to have a bowel movement.  Subjective:  Patient is 71 year old Caucasian female who was evaluated last month for a single episode of rectal bleeding. She underwent colonoscopy on 08/24/2011 and noted to have moderate number of diverticula and sigmoid colon. She had 2 small polyps ablated via cold biopsy and these were non-adenomatous. Now she presents with 4 week history of intermittent lower abdominal pain, bloating and constant urge to have a bowel movement. She usually has formed stool in the morning followed by loose stool associated with cramping. The rest of the day she has an urge to have a bowel movement. He also complains of flatulence intermittent cramping across the lower abdomen more on the left size. She denies nausea vomiting or anorexia. Her appetite is fair. She has lost 70 pounds over the last 2 years. She believes all of this weight loss is voluntary. She exercises in the pool for 2 hours 5 days each week and she also does stationary bike for 5 hours per week. She says she eats a lot of fresh fruits and vegetables and now also taking Metamucil. She has an appointment with her gynecologist in near future.  Current Medications: Current Outpatient Prescriptions  Medication Sig Dispense Refill  . ALPRAZolam (XANAX) 1 MG tablet Take 1 mg by mouth 2 (two) times daily.       Marland Kitchen aspirin 81 MG tablet Take 81 mg by mouth daily.        . beta carotene w/minerals (OCUVITE) tablet Take 1 tablet by mouth daily.        . calcium carbonate (OS-CAL) 600 MG TABS Take 600 mg by mouth 2 (two) times daily with a meal.        . Cholecalciferol (VITAMIN D3) 3000 UNITS TABS Take 3,000 Units by mouth daily.      . diclofenac (VOLTAREN) 50 MG EC tablet Take 50 mg by mouth 2 (two) times daily.        Marland Kitchen dicyclomine (BENTYL) 10 MG capsule Take 10 mg by mouth as needed. IBS      . glucosamine-chondroitin 500-400 MG tablet Take 1 tablet by mouth 2  (two) times daily.       Marland Kitchen HYDROcodone-acetaminophen (NORCO) 10-325 MG per tablet Take 1 tablet by mouth every 6 (six) hours as needed. pain      . lansoprazole (PREVACID) 30 MG capsule Take 30 mg by mouth 2 (two) times daily before a meal.      . levothyroxine (SYNTHROID, LEVOTHROID) 100 MCG tablet Take 100 mcg by mouth daily.        Marland Kitchen lubiprostone (AMITIZA) 8 MCG capsule Take 8 mcg by mouth 2 (two) times daily.      . Omega-3 Fatty Acids (FISH OIL) 1200 MG CAPS Take by mouth 2 (two) times daily before a meal.       . pravastatin (PRAVACHOL) 80 MG tablet Take 40 mg by mouth daily.       . propranolol (INDERAL) 40 MG tablet Take 40 mg by mouth 2 (two) times daily.       Marland Kitchen pyridostigmine (MESTINON) 60 MG tablet Take 60 mg by mouth 3 (three) times daily.      . SUMAtriptan (IMITREX) 50 MG tablet Take 50 mg by mouth every 2 (two) hours as needed. headaches      . vitamin B-12 (CYANOCOBALAMIN) 500 MCG tablet Take 500 mcg by mouth daily.  Objective: Blood pressure 120/80, pulse 68, temperature 97.7 F (36.5 C), temperature source Oral, resp. rate 20, height 5\' 4"  (1.626 m), weight 207 lb 4.8 oz (94.031 kg). Patient is alert and in no acute distress Conjunctiva is pink. Sclera is nonicteric Oropharyngeal mucosa is normal. No neck masses or thyromegaly noted. Cardiac exam with regular rhythm normal S1 and S2. No murmur or gallop noted. Lungs are clear to auscultation. Abdomen is full. Bowel sounds are normal. Abdomen is soft with mild tenderness in LLQ and hypogastric area. No organomegaly or masses noted. No LE edema or clubbing noted.   Assessment:  Patient has multiple GI symptoms including lower abdominal pain bloating constant urge to have a bowel movement. Her weight loss possibly is voluntary. Some of her abdominal pain may be related to her exercises. Constant urge to have a bowel movement maybe secondary  to Amitiza. She may also have low-grade diverticulitis secondary to  NSAID therapy. Pelvic exam needs to be done to make sure she does not have gynecologic problems and she does not have gynecologic disease she already has an appointment with her gynecologist.   Plan:  Discontinue Amitiza. Take dicyclomine 10 mg by mouth twice a day rather than on when necessary basis. Colace 100 mg by mouth twice a day. Continue fiber supplementation 4 g by mouth daily. Align and one capsule by mouth daily. Can use glycerin or Dulcolax suppository on when necessary basis. Take one dose of Diclofenac for the next two weeks or so. Abdomino-pelvic CT with contrast. Office visit in 2 months.

## 2011-09-27 LAB — CREATININE, SERUM: Creat: 1.18 mg/dL — ABNORMAL HIGH (ref 0.50–1.10)

## 2011-09-29 ENCOUNTER — Ambulatory Visit (HOSPITAL_COMMUNITY)
Admission: RE | Admit: 2011-09-29 | Discharge: 2011-09-29 | Disposition: A | Discharge status: Home / Self Care | Payer: Medicare Other | Source: Ambulatory Visit | Attending: Internal Medicine | Admitting: Internal Medicine

## 2011-09-29 DIAGNOSIS — R599 Enlarged lymph nodes, unspecified: Secondary | ICD-10-CM | POA: Diagnosis not present

## 2011-09-29 DIAGNOSIS — R140 Abdominal distension (gaseous): Secondary | ICD-10-CM

## 2011-09-29 DIAGNOSIS — R1032 Left lower quadrant pain: Secondary | ICD-10-CM | POA: Diagnosis not present

## 2011-09-29 DIAGNOSIS — K5730 Diverticulosis of large intestine without perforation or abscess without bleeding: Secondary | ICD-10-CM | POA: Insufficient documentation

## 2011-09-29 DIAGNOSIS — R1031 Right lower quadrant pain: Secondary | ICD-10-CM | POA: Insufficient documentation

## 2011-09-29 MED ORDER — IOHEXOL 300 MG/ML  SOLN
100.0000 mL | Freq: Once | INTRAMUSCULAR | Status: AC | PRN
  Administered 2011-09-29: 100 mL via INTRAVENOUS

## 2011-10-06 DIAGNOSIS — R143 Flatulence: Secondary | ICD-10-CM | POA: Diagnosis not present

## 2011-10-06 DIAGNOSIS — K668 Other specified disorders of peritoneum: Secondary | ICD-10-CM | POA: Diagnosis not present

## 2011-10-06 DIAGNOSIS — R109 Unspecified abdominal pain: Secondary | ICD-10-CM | POA: Diagnosis not present

## 2011-11-03 ENCOUNTER — Ambulatory Visit (HOSPITAL_COMMUNITY): Payer: Medicare Other

## 2011-11-14 DIAGNOSIS — M129 Arthropathy, unspecified: Secondary | ICD-10-CM | POA: Diagnosis not present

## 2011-11-14 DIAGNOSIS — G43909 Migraine, unspecified, not intractable, without status migrainosus: Secondary | ICD-10-CM | POA: Diagnosis not present

## 2011-11-27 ENCOUNTER — Ambulatory Visit (INDEPENDENT_AMBULATORY_CARE_PROVIDER_SITE_OTHER): Payer: Medicare Other | Admitting: Internal Medicine

## 2011-11-28 DIAGNOSIS — K589 Irritable bowel syndrome without diarrhea: Secondary | ICD-10-CM | POA: Diagnosis not present

## 2011-11-28 DIAGNOSIS — M810 Age-related osteoporosis without current pathological fracture: Secondary | ICD-10-CM | POA: Diagnosis not present

## 2011-11-28 DIAGNOSIS — Z23 Encounter for immunization: Secondary | ICD-10-CM | POA: Diagnosis not present

## 2011-11-28 DIAGNOSIS — K219 Gastro-esophageal reflux disease without esophagitis: Secondary | ICD-10-CM | POA: Diagnosis not present

## 2011-11-28 DIAGNOSIS — E785 Hyperlipidemia, unspecified: Secondary | ICD-10-CM | POA: Diagnosis not present

## 2011-11-28 DIAGNOSIS — E039 Hypothyroidism, unspecified: Secondary | ICD-10-CM | POA: Diagnosis not present

## 2011-12-05 ENCOUNTER — Ambulatory Visit (INDEPENDENT_AMBULATORY_CARE_PROVIDER_SITE_OTHER): Payer: Medicare Other | Admitting: Internal Medicine

## 2011-12-05 ENCOUNTER — Ambulatory Visit (HOSPITAL_COMMUNITY): Payer: Medicare Other

## 2011-12-06 ENCOUNTER — Encounter (HOSPITAL_COMMUNITY): Payer: Medicare Other | Attending: Oncology | Admitting: Oncology

## 2011-12-06 VITALS — BP 176/81 | HR 47 | Temp 97.4°F | Resp 18 | Ht 64.0 in | Wt 204.0 lb

## 2011-12-06 DIAGNOSIS — E7800 Pure hypercholesterolemia, unspecified: Secondary | ICD-10-CM | POA: Insufficient documentation

## 2011-12-06 DIAGNOSIS — G7000 Myasthenia gravis without (acute) exacerbation: Secondary | ICD-10-CM | POA: Diagnosis not present

## 2011-12-06 DIAGNOSIS — E039 Hypothyroidism, unspecified: Secondary | ICD-10-CM | POA: Insufficient documentation

## 2011-12-06 DIAGNOSIS — K589 Irritable bowel syndrome without diarrhea: Secondary | ICD-10-CM | POA: Insufficient documentation

## 2011-12-06 DIAGNOSIS — R599 Enlarged lymph nodes, unspecified: Secondary | ICD-10-CM | POA: Diagnosis not present

## 2011-12-06 LAB — CBC WITH DIFFERENTIAL/PLATELET
Basophils Absolute: 0 10*3/uL (ref 0.0–0.1)
Basophils Relative: 1 % (ref 0–1)
HCT: 38 % (ref 36.0–46.0)
Hemoglobin: 12.7 g/dL (ref 12.0–15.0)
Lymphocytes Relative: 43 % (ref 12–46)
Monocytes Absolute: 0.4 10*3/uL (ref 0.1–1.0)
Neutro Abs: 2.8 10*3/uL (ref 1.7–7.7)
Neutrophils Relative %: 47 % (ref 43–77)
RDW: 13.5 % (ref 11.5–15.5)
WBC: 5.8 10*3/uL (ref 4.0–10.5)

## 2011-12-06 LAB — COMPREHENSIVE METABOLIC PANEL
ALT: 13 U/L (ref 0–35)
AST: 19 U/L (ref 0–37)
Albumin: 3.8 g/dL (ref 3.5–5.2)
Alkaline Phosphatase: 66 U/L (ref 39–117)
CO2: 26 mEq/L (ref 19–32)
Chloride: 104 mEq/L (ref 96–112)
Creatinine, Ser: 0.94 mg/dL (ref 0.50–1.10)
GFR calc non Af Amer: 59 mL/min — ABNORMAL LOW (ref >90)
Potassium: 3.5 mEq/L (ref 3.5–5.1)
Total Bilirubin: 0.4 mg/dL (ref 0.3–1.2)

## 2011-12-06 LAB — LACTATE DEHYDROGENASE: LDH: 181 U/L (ref 94–250)

## 2011-12-06 NOTE — Progress Notes (Signed)
Problem #1 enlarged lymph node in the para-aortic area on the left 1.3 cm but appears to be hypervascularity. This is felt to be atypical lymph node. Problem #2 IBS with abdominal pain recently, bloating, and urge to move her bowels which prompted the CT scan Problem #3 obesity though she has lost 70 pounds in last 2-3 years 30 pounds which is been a last 6 months. She however does not have B. Symptomatology. 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 In the workup for for abdominal complaints she warrants did a CT scan her abdomen which showed a single enlarged lymph node 1.3 cm in size. No other nodes are noted in the pelvis or abdomen. She is not aware of lymph nodes either. She has not had fevers chills or night sweats. Appetite is excellent. She's been trying hard to lose weight for the last 3 years.  She is accompanied by her one daughter and one granddaughter. Both of her parents are deceased but not from cancer. She is divorced. She is a nonsmoker. She is not a drinker. She is not feel bad at this time. She does take Mestinon for her ocular myasthenia gravis per her neurologist.  Vital signs recorded she has no lymphadenopathy in cervical, supraclavicular, infraclavicular, axillary epitrochlear, or inguinal areas. She does not palpable hepatosplenomegaly. Her lungs are clear. Her heart shows a regular rhythm and rate without murmur rub or gallop. Her lungs are clear otherwise. Breast exam is negative for masses. She has no thyromegaly. Teeth are in good repair. Throat is clear. Facial symmetry appears intact. EOMs appear intact presently. Pupils are equally round and reactive to light. Bowel sounds are normal  at this time. She has no leg edema no arm edema. Dorsalis pedis pulses are 2+ symmetrical but posterior tibialis pulses I cannot appreciate. Nails are unremarkable.  She needs some blood work but if that is unremarkable I would wait 12 weeks from her prior CAT scan which was in July, repeat her CAT scan and see if this lymph node is still enlarged. It is still enlarged or getting larger then she would need a PET scan and a biopsy. I've discussed her case with Dr. Tyron Russell in radiology who feels that this lymph node is easily accessible by interventional radiology. I will see her after her CAT scan in mid October sooner if her laboratory studies are abnormal.

## 2011-12-06 NOTE — Patient Instructions (Addendum)
[facility]  Discharge Instructions  RECOMMENDATIONS MADE BY THE CONSULTANT AND ANY TEST RESULTS WILL BE SENT TO YOUR REFERRING DOCTOR.   EXAM FINDINGS BY MD TODAY AND SIGNS AND SYMPTOMS TO REPORT TO CLINIC OR PRIMARY MD: exam and discussion by Dr. Mariel Sleet.  We will do a CT scan of your abdomen in October to see if there are any changes before we do anything else.  Will check some labs today.  MEDICATIONS PRESCRIBED: none      SPECIAL INSTRUCTIONS/FOLLOW-UP: Lab work Needed today, Xray Studies Needed in October and Return to Clinic after the scans to see MD.   I acknowledge that I have been informed and understand all the instructions given to me and received a copy. I do not have any more questions at this time, but understand that I may call the Specialty Clinic at [facility] at [phone] during business hours should I have any further questions or need assistance in obtaining follow-up care.    __________________________________________  _____________  __________ Signature of Patient or Authorized Representative            Date                   Time    __________________________________________ Nurse's Signature

## 2011-12-11 ENCOUNTER — Ambulatory Visit (INDEPENDENT_AMBULATORY_CARE_PROVIDER_SITE_OTHER): Payer: Medicare Other | Admitting: Internal Medicine

## 2011-12-11 ENCOUNTER — Encounter (INDEPENDENT_AMBULATORY_CARE_PROVIDER_SITE_OTHER): Payer: Self-pay | Admitting: Internal Medicine

## 2011-12-11 VITALS — BP 124/70 | HR 74 | Temp 98.9°F | Resp 20 | Ht 64.0 in | Wt 207.3 lb

## 2011-12-11 DIAGNOSIS — K589 Irritable bowel syndrome without diarrhea: Secondary | ICD-10-CM | POA: Diagnosis not present

## 2011-12-11 NOTE — Patient Instructions (Addendum)
Stop Align when prescription runs out. Can decrease dicyclomine to 10 mg once a day if you  develop constipation

## 2011-12-11 NOTE — Progress Notes (Signed)
Presenting complaint;  Followup for abdominal pain and bowel urgency. Subjective:  Patient is 71 year old Caucasian female who was last seen on 09/26/2011 for lower abdominal pain and bowel urgency. She underwent abdominopelvic CT. She was found to have enlarged abnormal appearing data aortic lymph node. She was therefore referred to Dr. Mariel Sleet. After review of her options she decided to have a repeat CT in 3 months and go from there. She feels quite well. She is having formed stool daily. She denies urgency or abdominal pain. She is having no side effects with dicyclomine. She has cut back on NSAIDs therapy and can tell stiffness 2 small joints of both hands.  Current Medications: Current Outpatient Prescriptions  Medication Sig Dispense Refill  . ALPRAZolam (XANAX) 1 MG tablet Take 1 mg by mouth 2 (two) times daily.       Marland Kitchen aspirin 81 MG tablet Take 81 mg by mouth daily.        . beta carotene w/minerals (OCUVITE) tablet Take 1 tablet by mouth daily.        . bifidobacterium infantis (ALIGN) capsule Take 1 capsule by mouth daily.  14 capsule  0  . calcium carbonate (OS-CAL) 600 MG TABS Take 600 mg by mouth 2 (two) times daily with a meal.        . Cholecalciferol (VITAMIN D3) 3000 UNITS TABS Take 1,000 Units by mouth 2 (two) times daily.       . diclofenac (VOLTAREN) 50 MG EC tablet Take 50 mg by mouth 2 (two) times daily.        Marland Kitchen dicyclomine (BENTYL) 10 MG capsule Take 1 capsule (10 mg total) by mouth 2 (two) times daily. IBS  60 capsule  5  . docusate sodium (COLACE) 100 MG capsule Take 100 mg by mouth as needed.       Marland Kitchen glucosamine-chondroitin 500-400 MG tablet Take 1 tablet by mouth 2 (two) times daily.       Marland Kitchen HYDROcodone-acetaminophen (NORCO) 10-325 MG per tablet Take 1 tablet by mouth every 6 (six) hours as needed. pain      . lansoprazole (PREVACID) 30 MG capsule Take 30 mg by mouth 2 (two) times daily before a meal.      . levothyroxine (SYNTHROID, LEVOTHROID) 100 MCG tablet  Take 112 mcg by mouth daily.       Marland Kitchen LIDODERM 5 % Place 3 patches onto the skin every 12 (twelve) hours.       . Omega-3 Fatty Acids (FISH OIL) 1200 MG CAPS Take by mouth daily.       . pravastatin (PRAVACHOL) 80 MG tablet Take 80 mg by mouth daily.       . propranolol (INDERAL) 40 MG tablet Take 40 mg by mouth 2 (two) times daily.       . psyllium (METAMUCIL) 58.6 % powder Take 1 packet by mouth 2 (two) times daily.      Marland Kitchen pyridostigmine (MESTINON) 60 MG tablet Take 60 mg by mouth 2 (two) times daily. Patient states that she is currently taking 1/2 tablet twice a day      . SUMAtriptan (IMITREX) 50 MG tablet Take 50 mg by mouth every 2 (two) hours as needed. headaches      . triamcinolone ointment (KENALOG) 0.5 % Apply 1 application topically as needed.       . vitamin B-12 (CYANOCOBALAMIN) 500 MCG tablet Take 500 mcg by mouth daily.          Objective: Blood pressure 124/70,  pulse 74, temperature 98.9 F (37.2 C), temperature source Oral, resp. rate 20, height 5\' 4"  (1.626 m), weight 207 lb 4.8 oz (94.031 kg). Conjunctiva is pink. Sclera is nonicteric Oropharyngeal mucosa is normal. No neck masses or thyromegaly noted. Abdomen is symmetrical soft and nontender without organomegaly or masses. No LE edema or clubbing noted.  Assessment:  #1. Irritable bowel syndrome. Abdominal pain and urgency has resolved and she is having normal bowel movements and no side effects with anti-spasmodic. #2. Enlarged abnormal appearing para-aortic lymph nodes. Patient has been evaluated by Dr. Mariel Sleet. He plans to repeat CT in October in followup with a PET scan unless lymph node gets smaller.    Plan: Can discontinue Align  when she runs out of prescription. Can decrease dicyclomine to 10 mg once a day should she develop constipation.  Office visit in 6 months.

## 2012-01-01 ENCOUNTER — Ambulatory Visit (HOSPITAL_COMMUNITY)
Admission: RE | Admit: 2012-01-01 | Discharge: 2012-01-01 | Disposition: A | Discharge status: Home / Self Care | Payer: Medicare Other | Source: Ambulatory Visit | Attending: Oncology | Admitting: Oncology

## 2012-01-01 ENCOUNTER — Ambulatory Visit (HOSPITAL_COMMUNITY)
Admission: RE | Admit: 2012-01-01 | Discharge: 2012-01-01 | Disposition: A | Discharge status: Home / Self Care | Payer: Medicare Other | Source: Ambulatory Visit | Attending: Internal Medicine | Admitting: Internal Medicine

## 2012-01-01 ENCOUNTER — Other Ambulatory Visit (HOSPITAL_COMMUNITY): Payer: Self-pay | Admitting: Internal Medicine

## 2012-01-01 DIAGNOSIS — R599 Enlarged lymph nodes, unspecified: Secondary | ICD-10-CM | POA: Diagnosis not present

## 2012-01-01 DIAGNOSIS — J209 Acute bronchitis, unspecified: Secondary | ICD-10-CM | POA: Diagnosis not present

## 2012-01-01 DIAGNOSIS — R042 Hemoptysis: Secondary | ICD-10-CM | POA: Insufficient documentation

## 2012-01-01 DIAGNOSIS — R0602 Shortness of breath: Secondary | ICD-10-CM | POA: Diagnosis not present

## 2012-01-01 MED ORDER — IOHEXOL 300 MG/ML  SOLN
100.0000 mL | Freq: Once | INTRAMUSCULAR | Status: AC | PRN
  Administered 2012-01-01: 100 mL via INTRAVENOUS

## 2012-01-02 ENCOUNTER — Telehealth (INDEPENDENT_AMBULATORY_CARE_PROVIDER_SITE_OTHER): Payer: Self-pay | Admitting: *Deleted

## 2012-01-02 ENCOUNTER — Telehealth (HOSPITAL_COMMUNITY): Payer: Self-pay

## 2012-01-02 ENCOUNTER — Other Ambulatory Visit (HOSPITAL_COMMUNITY): Payer: Self-pay | Admitting: Oncology

## 2012-01-02 DIAGNOSIS — R591 Generalized enlarged lymph nodes: Secondary | ICD-10-CM

## 2012-01-02 NOTE — Telephone Encounter (Signed)
[name] wants to come in and talk with MD about CT results before doing PET and biopsy and will keep appointment tomorrow with Dr. Mariel Sleet.

## 2012-01-02 NOTE — Telephone Encounter (Signed)
[name] called to let Dr. Karilyn Cota know she had another CT of the abd and pelvis on 01/01/12 order by Dr. Mariel Sleet. She would like for Dr. Karilyn Cota to look at it. The patient's return phone number is [phone].

## 2012-01-03 ENCOUNTER — Encounter (HOSPITAL_COMMUNITY): Payer: Medicare Other | Attending: Oncology | Admitting: Oncology

## 2012-01-03 ENCOUNTER — Encounter (HOSPITAL_COMMUNITY): Payer: Self-pay | Admitting: Oncology

## 2012-01-03 VITALS — BP 115/65 | HR 50 | Temp 98.0°F | Resp 18 | Wt 208.4 lb

## 2012-01-03 DIAGNOSIS — R599 Enlarged lymph nodes, unspecified: Secondary | ICD-10-CM | POA: Insufficient documentation

## 2012-01-03 NOTE — Progress Notes (Signed)
Problem #1 enlarging lymph node in the left periaortic region is now 1.5 cm and appears to be hypervascular and atypical.  We are going to proceed with a PET scan for further evaluation of this lymph node and to see if there are other lymph nodes that appear hyperactive. She is willing to proceed with that. Based upon the PET scan we'll decide whether a biopsy is indicated. It's not hyperactive lymph node on the PET scan we may want to just repeat a CT scan in 3 months or 4 months. I personally think that this lymph node needs to be biopsied if it light up on PET scan. Interestingly her LDH CBC differential and metabolic panel are all quite good including sedimentation rate.

## 2012-01-03 NOTE — Telephone Encounter (Signed)
Forwarded to Dr.Rehman. 

## 2012-01-03 NOTE — Patient Instructions (Addendum)
[facility]  Discharge Instructions  RECOMMENDATIONS MADE BY THE CONSULTANT AND ANY TEST RESULTS WILL BE SENT TO YOUR REFERRING DOCTOR.   EXAM FINDINGS BY MD TODAY AND SIGNS AND SYMPTOMS TO REPORT TO CLINIC OR PRIMARY MD: Let us know if you want to go through with the biopsy.  SPECIAL INSTRUCTIONS/FOLLOW-UP: Return in 2-3 weeks   I acknowledge that I have been informed and understand all the instructions given to me and received a copy. I do not have any more questions at this time, but understand that I may call the Specialty Clinic at [facility] at [phone] during business hours should I have any further questions or need assistance in obtaining follow-up care.    __________________________________________  _____________  __________ Signature of Patient or Authorized Representative            Date                   Time    __________________________________________ Nurse's Signature

## 2012-01-10 ENCOUNTER — Encounter (HOSPITAL_COMMUNITY): Payer: Self-pay

## 2012-01-10 ENCOUNTER — Other Ambulatory Visit (HOSPITAL_COMMUNITY): Payer: Self-pay | Admitting: Oncology

## 2012-01-10 ENCOUNTER — Encounter (HOSPITAL_COMMUNITY)
Admission: RE | Admit: 2012-01-10 | Discharge: 2012-01-10 | Disposition: A | Discharge status: Home / Self Care | Payer: Medicare Other | Source: Ambulatory Visit | Attending: Oncology | Admitting: Oncology

## 2012-01-10 DIAGNOSIS — R591 Generalized enlarged lymph nodes: Secondary | ICD-10-CM

## 2012-01-10 DIAGNOSIS — R599 Enlarged lymph nodes, unspecified: Secondary | ICD-10-CM | POA: Insufficient documentation

## 2012-01-10 DIAGNOSIS — E049 Nontoxic goiter, unspecified: Secondary | ICD-10-CM | POA: Insufficient documentation

## 2012-01-10 DIAGNOSIS — C7A00 Malignant carcinoid tumor of unspecified site: Secondary | ICD-10-CM | POA: Diagnosis not present

## 2012-01-10 MED ORDER — FLUDEOXYGLUCOSE F - 18 (FDG) INJECTION
14.4000 | Freq: Once | INTRAVENOUS | Status: AC | PRN
  Administered 2012-01-10: 14.4 via INTRAVENOUS

## 2012-01-11 DIAGNOSIS — E039 Hypothyroidism, unspecified: Secondary | ICD-10-CM | POA: Diagnosis not present

## 2012-01-11 NOTE — Telephone Encounter (Signed)
Patient's call returned. She is scheduled to undergo lymph node biopsy as recommended by Dr. Mariel Sleet.

## 2012-01-15 ENCOUNTER — Other Ambulatory Visit: Payer: Self-pay | Admitting: Radiology

## 2012-01-16 ENCOUNTER — Telehealth (INDEPENDENT_AMBULATORY_CARE_PROVIDER_SITE_OTHER): Payer: Self-pay | Admitting: *Deleted

## 2012-01-16 ENCOUNTER — Encounter (HOSPITAL_COMMUNITY): Payer: Self-pay

## 2012-01-16 ENCOUNTER — Ambulatory Visit (HOSPITAL_COMMUNITY)
Admission: RE | Admit: 2012-01-16 | Discharge: 2012-01-16 | Disposition: A | Discharge status: Home / Self Care | Payer: Medicare Other | Source: Ambulatory Visit | Attending: Oncology | Admitting: Oncology

## 2012-01-16 DIAGNOSIS — D487 Neoplasm of uncertain behavior of other specified sites: Secondary | ICD-10-CM | POA: Diagnosis not present

## 2012-01-16 DIAGNOSIS — R599 Enlarged lymph nodes, unspecified: Secondary | ICD-10-CM | POA: Insufficient documentation

## 2012-01-16 DIAGNOSIS — R935 Abnormal findings on diagnostic imaging of other abdominal regions, including retroperitoneum: Secondary | ICD-10-CM | POA: Diagnosis not present

## 2012-01-16 DIAGNOSIS — R591 Generalized enlarged lymph nodes: Secondary | ICD-10-CM

## 2012-01-16 DIAGNOSIS — Z85820 Personal history of malignant melanoma of skin: Secondary | ICD-10-CM | POA: Diagnosis not present

## 2012-01-16 DIAGNOSIS — D3A098 Benign carcinoid tumors of other sites: Secondary | ICD-10-CM | POA: Diagnosis not present

## 2012-01-16 HISTORY — PX: OTHER SURGICAL HISTORY: SHX169

## 2012-01-16 LAB — CBC
HCT: 38.2 % (ref 36.0–46.0)
Hemoglobin: 12.7 g/dL (ref 12.0–15.0)
MCH: 32.6 pg (ref 26.0–34.0)
MCHC: 33.2 g/dL (ref 30.0–36.0)
MCV: 98.2 fL (ref 78.0–100.0)
RDW: 13.1 % (ref 11.5–15.5)

## 2012-01-16 LAB — APTT: aPTT: 30 seconds (ref 24–37)

## 2012-01-16 MED ORDER — FENTANYL CITRATE 0.05 MG/ML IJ SOLN
INTRAMUSCULAR | Status: AC | PRN
  Administered 2012-01-16 (×2): 25 ug via INTRAVENOUS
  Administered 2012-01-16: 50 ug via INTRAVENOUS
  Administered 2012-01-16: 25 ug via INTRAVENOUS

## 2012-01-16 MED ORDER — SODIUM CHLORIDE 0.9 % IV SOLN
INTRAVENOUS | Status: DC
  Administered 2012-01-16: 1000 mL via INTRAVENOUS

## 2012-01-16 MED ORDER — SODIUM CHLORIDE 0.9 % IV SOLN
INTRAVENOUS | Status: AC | PRN
  Administered 2012-01-16: 50 mL/h via INTRAVENOUS

## 2012-01-16 MED ORDER — FENTANYL CITRATE 0.05 MG/ML IJ SOLN
INTRAMUSCULAR | Status: AC
  Filled 2012-01-16: qty 2

## 2012-01-16 MED ORDER — HYDROCODONE-ACETAMINOPHEN 5-325 MG PO TABS
1.0000 | ORAL_TABLET | ORAL | Status: DC | PRN
  Administered 2012-01-16: 2 via ORAL
  Filled 2012-01-16: qty 2

## 2012-01-16 MED ORDER — MIDAZOLAM HCL 2 MG/2ML IJ SOLN
INTRAMUSCULAR | Status: AC
  Filled 2012-01-16: qty 4

## 2012-01-16 MED ORDER — MIDAZOLAM HCL 2 MG/2ML IJ SOLN
INTRAMUSCULAR | Status: AC | PRN
  Administered 2012-01-16: 1 mg via INTRAVENOUS
  Administered 2012-01-16 (×3): 0.5 mg via INTRAVENOUS

## 2012-01-16 NOTE — Progress Notes (Signed)
1245 biopsy discharge instructions and medication discharge instructions given to patient and family.  All voice understanding.

## 2012-01-16 NOTE — Telephone Encounter (Signed)
[name] called to let Dr. Karilyn Cota know she is having her needle biopsy today, 01/16/12, at [facility] by Dr. Linus Mako.

## 2012-01-16 NOTE — ED Notes (Signed)
O2 d/c 

## 2012-01-16 NOTE — ED Notes (Signed)
Dressing checked with short stay RN [place] and is clean dry and intact.

## 2012-01-16 NOTE — H&P (Signed)
[name] is an 71 y.o. female.   Chief Complaint: abd distension and pain x 3 months eval with GI MD: CT revealed periaortic LAN +PET Skin ca hx Scheduled for periaortic LN biopsy today HPI: HTN; HLD; hypothyroid; migraines; IBS; myasthenia gravis; nonsmoker  Past Medical History  Diagnosis Date  . Unspecified essential hypertension   . Mixed hyperlipidemia   . Obesity, unspecified   . Esophageal reflux   . Hypertension     over 10 yrs  . Hypothyroidism   . Migraines   . Osteoarthritis of knee     both knees  . IBS (irritable colon syndrome)   . Complication of anesthesia     Low blood pressure, heart rate, O2 sat  . Ocular myasthenia gravis     Past Surgical History  Procedure Date  . Knee surgery   . Total knee arthroplasty     both knee. 2005 1st, 2011 the last one  . Abdominal hysterectomy   . Cholecystectomy   . Nasal sinus surgery     30 yrs ago  . Colonoscopy 08/24/2011    Procedure: COLONOSCOPY;  Surgeon: Pamela Hippo, MD;  Location: [facility];  Service: Endoscopy;  Laterality: N/A;  1200    Family History  Problem Relation Age of Onset  . Inflammatory bowel disease Maternal Grandmother    Social History:  reports that she has never smoked. She has never used smokeless tobacco. She reports that she drinks alcohol. She reports that she does not use illicit drugs.  Allergies:  Allergies  Allergen Reactions  . Macrodantin (Nitrofurantoin Macrocrystal) Nausea And Vomiting  . Clarithromycin Nausea And Vomiting  . Clarithromycin   . Codeine     Can not take Codeine unless in cough syrup  . Nitrofurantoin Nausea And Vomiting  . Tetracyclines & Related Nausea And Vomiting  . Ampicillin Rash  . Penicillins Rash     (Not in a hospital admission)  No results found for this or any previous visit (from the past 48 hour(s)). No results found.  Review of Systems  Constitutional: Negative for fever.  Respiratory: Positive for cough.    Laryngeal spasm occassionally  Gastrointestinal: Positive for abdominal pain.       Distension  Neurological: Positive for headaches.  Psychiatric/Behavioral: The patient is nervous/anxious.     Blood pressure 134/73, pulse 46, temperature 98.3 F (36.8 C), temperature source Oral, resp. rate 18, height 5\' 4"  (1.626 m), weight 200 lb (90.719 kg), SpO2 99.00%. Physical Exam  Constitutional: She is oriented to person, place, and time.  Cardiovascular: Normal rate, regular rhythm and normal heart sounds.   No murmur heard. Respiratory: Effort normal and breath sounds normal. She has no wheezes.  GI: Soft. Bowel sounds are normal. There is no tenderness.  Musculoskeletal: Normal range of motion.  Neurological: She is alert and oriented to person, place, and time.  Psychiatric: She has a normal mood and affect. Judgment normal.     Assessment/Plan Remote hx skin ca Nonsmoker IBS; abd bloating CT shows mediastinal LAN +PET periaortic LN Scheduled for bx today Pt and family aware of procedure benefits and risks and agreeable to proceed Consent signed and in chart  [name] A 01/16/2012, 8:35 AM

## 2012-01-16 NOTE — H&P (Signed)
Agree with PA note.    Signed,  Heath K. McCullough, MD Vascular & Interventional Radiologist [facility]  

## 2012-01-16 NOTE — Telephone Encounter (Signed)
Dr.Rehman was made aware. 

## 2012-01-16 NOTE — Procedures (Signed)
Interventional Radiology Procedure Note  Procedure: CT guided biopsy of left para aortic lymph node Complications: None Recommendations: - Supine bed rest x 4 hrs - Advance diet as tolerated at noon - Pathology pending  Signed,  Sterling Big, MD Vascular & Interventional Radiologist [facility]

## 2012-01-17 ENCOUNTER — Telehealth (HOSPITAL_COMMUNITY): Payer: Self-pay | Admitting: *Deleted

## 2012-01-17 NOTE — Telephone Encounter (Signed)
Radiology post biopsy phone call.  Per pt doing well with no bleeding or unusual pain.  Encouraged to call for any problems or questions.

## 2012-01-22 ENCOUNTER — Other Ambulatory Visit (HOSPITAL_COMMUNITY): Payer: Self-pay | Admitting: Internal Medicine

## 2012-01-22 DIAGNOSIS — Z139 Encounter for screening, unspecified: Secondary | ICD-10-CM

## 2012-01-23 ENCOUNTER — Ambulatory Visit (HOSPITAL_COMMUNITY): Payer: Medicare Other | Admitting: Oncology

## 2012-01-24 ENCOUNTER — Encounter (HOSPITAL_COMMUNITY): Payer: Medicare Other | Attending: Oncology | Admitting: Oncology

## 2012-01-24 ENCOUNTER — Encounter (HOSPITAL_COMMUNITY): Payer: Self-pay | Admitting: Oncology

## 2012-01-24 VITALS — BP 136/49 | HR 42 | Temp 98.3°F | Resp 18 | Wt 207.8 lb

## 2012-01-24 DIAGNOSIS — D3A098 Benign carcinoid tumors of other sites: Secondary | ICD-10-CM

## 2012-01-24 DIAGNOSIS — D3A8 Other benign neuroendocrine tumors: Secondary | ICD-10-CM

## 2012-01-24 DIAGNOSIS — R946 Abnormal results of thyroid function studies: Secondary | ICD-10-CM | POA: Diagnosis not present

## 2012-01-24 DIAGNOSIS — D3A00 Benign carcinoid tumor of unspecified site: Secondary | ICD-10-CM | POA: Insufficient documentation

## 2012-01-24 NOTE — Patient Instructions (Addendum)
[facility]  Discharge Instructions  RECOMMENDATIONS MADE BY THE CONSULTANT AND ANY TEST RESULTS WILL BE SENT TO YOUR REFERRING DOCTOR.   EXAM FINDINGS BY MD TODAY AND SIGNS AND SYMPTOMS TO REPORT TO CLINIC OR PRIMARY MD: Discussion by Dr. Mariel Sleet.  You either have a Para-ganglioma or a Carcinoid.  Once we get more information we will get a plan in motion.  Dr. Patty Sermons office is suppose to call you to schedule a capsule study.  Need to know if there's anything going on in your bowel before we try to get the area removed.  We will also get you scheduled for a CT guided biopsy of your thyroid and a consult with Dr. Fransico Him. Thyroid glands usually light up on PET scans.  MD is more concerned about the lymph node.  MEDICATIONS PRESCRIBED: none      SPECIAL INSTRUCTIONS/FOLLOW-UP: Return to Clinic as scheduled and you will be contacted about the other appointments when they are scheduled.   I acknowledge that I have been informed and understand all the instructions given to me and received a copy. I do not have any more questions at this time, but understand that I may call the Specialty Clinic at [facility] at [phone] during business hours should I have any further questions or need assistance in obtaining follow-up care.    __________________________________________  _____________  __________ Signature of Patient or Authorized Representative            Date                   Time    __________________________________________ Nurse's Signature

## 2012-01-24 NOTE — Progress Notes (Signed)
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 It is suggested to be either a paraganglioma, carcinoid, or pheochromocytoma. She is not hypertensive does not have tachycardia and hopefully is unlikely to be the latter. She will however have 24-hour urine collection for VMAs/ catecholamines. I've discussed her case today extensively with Dr. Gerrit Friends who will see her in consultation. She will also have a biopsy of her thyroid gland which was overactive on her PET scan but I suspect it is a thyroiditis since she has diffuse uptake bilaterally. She is not hyperthyroid, she is actually hypothyroid and on replacement Synthroid. Presently she is not having diarrhea. I've also discussed her case with Dr. Karilyn Cota who plans on a capsule camera study and EGD if necessary. We will also get a consultation with Dr. Fransico Him.  I discussed her findings with her and her daughter [name] today extensively and I think they both have a better understanding of what we are exploring. If we find nothing in the GI tract I think this tumor which is clearly growing will need to be operated on for removal.

## 2012-01-24 NOTE — Addendum Note (Signed)
Addended by: Evelena Leyden on: 01/24/2012 06:22 PM   Modules accepted: Orders

## 2012-01-25 ENCOUNTER — Other Ambulatory Visit (INDEPENDENT_AMBULATORY_CARE_PROVIDER_SITE_OTHER): Payer: Self-pay | Admitting: *Deleted

## 2012-01-25 DIAGNOSIS — D3A8 Other benign neuroendocrine tumors: Secondary | ICD-10-CM

## 2012-01-26 ENCOUNTER — Encounter (HOSPITAL_COMMUNITY): Payer: Self-pay | Admitting: Pharmacy Technician

## 2012-01-26 ENCOUNTER — Other Ambulatory Visit (HOSPITAL_COMMUNITY): Payer: Self-pay | Admitting: *Deleted

## 2012-01-26 ENCOUNTER — Other Ambulatory Visit (HOSPITAL_COMMUNITY): Payer: Self-pay | Admitting: Oncology

## 2012-01-26 ENCOUNTER — Other Ambulatory Visit (HOSPITAL_COMMUNITY): Payer: Self-pay

## 2012-01-26 ENCOUNTER — Telehealth (INDEPENDENT_AMBULATORY_CARE_PROVIDER_SITE_OTHER): Payer: Self-pay

## 2012-01-26 DIAGNOSIS — R946 Abnormal results of thyroid function studies: Secondary | ICD-10-CM | POA: Diagnosis not present

## 2012-01-26 DIAGNOSIS — D3A8 Other benign neuroendocrine tumors: Secondary | ICD-10-CM

## 2012-01-26 DIAGNOSIS — D3A00 Benign carcinoid tumor of unspecified site: Secondary | ICD-10-CM | POA: Diagnosis not present

## 2012-01-26 DIAGNOSIS — E039 Hypothyroidism, unspecified: Secondary | ICD-10-CM

## 2012-01-26 NOTE — Telephone Encounter (Signed)
Per Dr Ardine Eng request pt given appt for 02-19-12. Pt aware.

## 2012-01-29 ENCOUNTER — Ambulatory Visit (HOSPITAL_COMMUNITY)
Admission: RE | Admit: 2012-01-29 | Discharge: 2012-01-29 | Disposition: A | Discharge status: Home / Self Care | Payer: Medicare Other | Source: Ambulatory Visit | Attending: Internal Medicine | Admitting: Internal Medicine

## 2012-01-29 ENCOUNTER — Encounter (HOSPITAL_COMMUNITY)
Admission: RE | Disposition: A | Discharge status: Home / Self Care | Payer: Self-pay | Source: Ambulatory Visit | Attending: Internal Medicine

## 2012-01-29 DIAGNOSIS — I10 Essential (primary) hypertension: Secondary | ICD-10-CM | POA: Diagnosis not present

## 2012-01-29 DIAGNOSIS — C7A00 Malignant carcinoid tumor of unspecified site: Secondary | ICD-10-CM | POA: Diagnosis not present

## 2012-01-29 HISTORY — PX: GIVENS CAPSULE STUDY: SHX5432

## 2012-01-29 SURGERY — IMAGING PROCEDURE, GI TRACT, INTRALUMINAL, VIA CAPSULE

## 2012-01-30 ENCOUNTER — Ambulatory Visit
Admit: 2012-01-30 | Discharge: 2012-01-30 | Disposition: A | Discharge status: Home / Self Care | Payer: Medicare Other | Attending: Oncology | Admitting: Oncology

## 2012-01-30 DIAGNOSIS — E039 Hypothyroidism, unspecified: Secondary | ICD-10-CM

## 2012-01-30 DIAGNOSIS — R946 Abnormal results of thyroid function studies: Secondary | ICD-10-CM

## 2012-01-30 DIAGNOSIS — E069 Thyroiditis, unspecified: Secondary | ICD-10-CM | POA: Diagnosis not present

## 2012-01-30 NOTE — H&P (Signed)
[name] is an 71 y.o. female.   Chief Complaint: Patient is here for small bowel given capsule study. HPI: Patient is 71 year old Caucasian female, retired [employer] fund[employer] who is evaluated by me 4 months ago for abdominal pain. She underwent abdominopelvic CT which reveals a large left aortic lymph node with abnormal texture. She was referred to Dr. Mariel Sleet for evaluation. She had followup CT 3 months later and this lesion has slightly increased in size. PET scan was also abnormal. She therefore had CT guided biopsy and pathology reveals neuroendocrine tumor and no lymph node tissue is identified. Therefore Dr. Mariel Sleet recommended evaluation of small bowel looking for primary.   Past Medical History  Diagnosis Date  . Unspecified essential hypertension   . Mixed hyperlipidemia   . Obesity, unspecified   . Esophageal reflux   . Hypertension     over 10 yrs  . Hypothyroidism   . Migraines   . Osteoarthritis of knee     both knees  . IBS (irritable colon syndrome)   . Complication of anesthesia     Low blood pressure, heart rate, O2 sat  . Ocular myasthenia gravis     Past Surgical History  Procedure Date  . Knee surgery   . Total knee arthroplasty     both knee. 2005 1st, 2011 the last one  . Abdominal hysterectomy   . Cholecystectomy   . Nasal sinus surgery     30 yrs ago  . Colonoscopy 08/24/2011    Procedure: COLONOSCOPY;  Surgeon: Malissa Hippo, MD;  Location: [facility];  Service: Endoscopy;  Laterality: N/A;  1200  . Ct guided biopsy 01/16/12    Family History  Problem Relation Age of Onset  . Inflammatory bowel disease Maternal Grandmother    Social History:  reports that she has never smoked. She has never used smokeless tobacco. She reports that she drinks alcohol. She reports that she does not use illicit drugs.  Allergies:  Allergies  Allergen Reactions  . Macrodantin (Nitrofurantoin Macrocrystal) Nausea And Vomiting  . Clarithromycin Nausea And Vomiting  .  Clarithromycin   . Codeine     Can not take Codeine unless in cough syrup  . Nitrofurantoin Nausea And Vomiting  . Tetracyclines & Related Nausea And Vomiting  . Ampicillin Rash  . Penicillins Rash    Medications Prior to Admission  Medication Sig Dispense Refill  . albuterol (PROVENTIL HFA;VENTOLIN HFA) 108 (90 BASE) MCG/ACT inhaler Inhale 2 puffs into the lungs every 6 (six) hours as needed. For wheezing      . ALPRAZolam (XANAX) 1 MG tablet Take 1 mg by mouth at bedtime as needed. HS as needed and can be repeated x 1 if needed.      Marland Kitchen aspirin 81 MG tablet Take 81 mg by mouth daily.        . beta carotene w/minerals (OCUVITE) tablet Take 1 tablet by mouth daily.        . bifidobacterium infantis (ALIGN) capsule Take 1 capsule by mouth daily.  14 capsule  0  . calcium carbonate (OS-CAL) 600 MG TABS Take 600 mg by mouth 2 (two) times daily with a meal.        . cholecalciferol (VITAMIN D) 1000 UNITS tablet Take 1,000 Units by mouth daily.      . diclofenac (VOLTAREN) 50 MG EC tablet Take 50 mg by mouth daily.      Marland Kitchen dicyclomine (BENTYL) 10 MG capsule Take 10 mg by mouth daily.      Marland Kitchen  docusate sodium (COLACE) 100 MG capsule Take 100 mg by mouth daily as needed. For constipation      . glucosamine-chondroitin 500-400 MG tablet Take 1 tablet by mouth 2 (two) times daily.       Marland Kitchen HYDROcodone-acetaminophen (NORCO) 10-325 MG per tablet Take 1 tablet by mouth every 6 (six) hours as needed. pain      . HYDROcodone-homatropine (HYCODAN) 5-1.5 MG/5ML syrup Take 5 mLs by mouth every 6 (six) hours as needed. For cough      . lansoprazole (PREVACID) 30 MG capsule Take 30 mg by mouth 2 (two) times daily before a meal.      . levothyroxine (SYNTHROID, LEVOTHROID) 112 MCG tablet Take 112 mcg by mouth daily.      Marland Kitchen lidocaine (LIDODERM) 5 % Place 1 patch onto the skin daily as needed. Remove & Discard patch within 12 hours or as directed by MD for pain      . pravastatin (PRAVACHOL) 80 MG tablet Take 80 mg  by mouth at bedtime.       . propranolol (INDERAL) 40 MG tablet Take 40 mg by mouth 2 (two) times daily.       Marland Kitchen pyridostigmine (MESTINON) 60 MG tablet Take 30 mg by mouth 2 (two) times daily.       . SUMAtriptan (IMITREX) 50 MG tablet Take 50 mg by mouth every 2 (two) hours as needed. For migraines      . triamcinolone ointment (KENALOG) 0.5 % Apply 1 application topically 2 (two) times daily as needed. To affected area      . vitamin B-12 (CYANOCOBALAMIN) 500 MCG tablet Take 500 mcg by mouth daily.         No results found for this or any previous visit (from the past 48 hour(s)). No results found.  ROS  Height 5\' 4"  (1.626 m), weight 207 lb (93.895 kg). Physical Exam   Assessment/Plan Metastatic neuroendocrine tumor involving left para-aortic lymph node. Small bowel given capsule study to look for primary.  PamelaNAJEEB Nolan 01/30/2012, 12:15 PM

## 2012-01-31 ENCOUNTER — Encounter (HOSPITAL_COMMUNITY): Payer: Self-pay | Admitting: Internal Medicine

## 2012-01-31 DIAGNOSIS — C7B8 Other secondary neuroendocrine tumors: Secondary | ICD-10-CM

## 2012-01-31 DIAGNOSIS — K2990 Gastroduodenitis, unspecified, without bleeding: Secondary | ICD-10-CM | POA: Diagnosis not present

## 2012-01-31 DIAGNOSIS — R933 Abnormal findings on diagnostic imaging of other parts of digestive tract: Secondary | ICD-10-CM

## 2012-01-31 DIAGNOSIS — K2970 Gastritis, unspecified, without bleeding: Secondary | ICD-10-CM | POA: Diagnosis not present

## 2012-01-31 DIAGNOSIS — K31811 Angiodysplasia of stomach and duodenum with bleeding: Secondary | ICD-10-CM | POA: Diagnosis not present

## 2012-01-31 NOTE — Op Note (Signed)
Small Bowel [place] Capsule Study Procedure date:  01/29/2012.  Referring Provider:  Dr. Glenford Peers MD PCP:  Dr. Dwana Melena, MD  Indication for procedure:  Patient is 71 year old Caucasian female who was found to have enlarged left para-aortic lymph node with abnormal texture. Followup CT 3 months later in October 2013 revealed increase in size of this lesion. PET scan was abnormal. She therefore underwent a CT guided biopsy of this lesion and it turns out to be neuroendocrine tumor. No lymph no tissue was seen on biopsy. Patient is being evaluated for excision of this lesion. Dr. Mariel Sleet commended evaluation of small bowel with given capsule to make sure she does not have primary and small bowel. Procedure and risks were reviewed with the patient and informed consent was obtained.   Findings:  Patient was able to swallow given capsule without any difficulty.  First Gastric image:  54 sec. First Duodenal image: 50 min; 10 sec. First Ileo-Cecal Valve image: 4 h; 24 min; 35 sec. First Cecal image: 4 hr; 24 min; 42 sec. Gastric Passage time:  49 min; 26 sec. Small Bowel Passage time:  3 hr; 35 min; 16 sec.  Summary & Recommendations: No abnormality noted to small bowel mucosa. Gastric mucosal changes suspicious for portal gastropathy. Few punctate antral telangiectasia noted. I have reviewed the patient's abdominal CT and she does not have any changes of cirrhosis. Findings reviewed with patient. Surgical consultation with Dr. Almond Lint as planned.   Copy to Dr. Glenford Peers and Dr. Dwana Melena.

## 2012-02-01 LAB — CATECHOLAMINES, FRACTIONATED, URINE, 24 HOUR
Catecholamines T: 35 mcg/24 h (ref 26–121)
Creatinine, Urine mg/day-CATEUR: 0.84 g/(24.h) (ref 0.63–2.50)
Dopamine 24 Hr Urine: 176 mcg/24 h (ref 52–480)
Norepinephrine 24 Hr Urine: 35 mcg/24 h (ref 15–100)

## 2012-02-06 LAB — VMA + CREATININE, URINE (TIMED COLLECTION): Creatinine 24h urine: 0.94 g/(24.h) (ref 0.63–2.50)

## 2012-02-07 ENCOUNTER — Telehealth (HOSPITAL_COMMUNITY): Payer: Self-pay

## 2012-02-07 ENCOUNTER — Encounter (HOSPITAL_COMMUNITY): Payer: Self-pay

## 2012-02-07 NOTE — Telephone Encounter (Signed)
Message copied by Evelena Leyden on Wed Feb 07, 2012  4:19 PM ------      Message from: Mariel Sleet, ERIC S      Created: Wed Feb 07, 2012  3:19 PM       Would she consider a neuro consult?

## 2012-02-07 NOTE — Telephone Encounter (Signed)
She is willing to see a neurologist and has been seen @ [facility] and she thinks the MD she has seen before was Dr. Anne Hahn.

## 2012-02-07 NOTE — Telephone Encounter (Signed)
Call from [place] with complaints of increased stumbling.  States "Dr. Mariel Sleet asked me if I was falling any and I didn't think about all my stumbling.  I actually wasn't falling because I was catching myself against the wall or on furniture.  Last week I was at the [facility] and just got out of the pool and the next thing I knew I was falling and didn't have anything to catch hold of.  It's happening more frequently.  Don't have any warning or any other symptoms except for occasional nausea."  Denies any dizziness, vomiting, blurred vision, headaches, etc.

## 2012-02-09 ENCOUNTER — Telehealth (HOSPITAL_COMMUNITY): Payer: Self-pay | Admitting: *Deleted

## 2012-02-09 NOTE — Telephone Encounter (Signed)
Dr. Stephanie Acre office called said they had an appt for [facility] on Nov 26th at 11:45AM and it will be with Dr. Anne Hahn and the patient has been informed.Marland KitchenMarland Kitchen

## 2012-02-13 ENCOUNTER — Other Ambulatory Visit: Payer: Self-pay | Admitting: Neurology

## 2012-02-13 DIAGNOSIS — G7000 Myasthenia gravis without (acute) exacerbation: Secondary | ICD-10-CM | POA: Diagnosis not present

## 2012-02-13 DIAGNOSIS — H532 Diplopia: Secondary | ICD-10-CM

## 2012-02-13 DIAGNOSIS — R269 Unspecified abnormalities of gait and mobility: Secondary | ICD-10-CM

## 2012-02-17 ENCOUNTER — Ambulatory Visit
Admission: RE | Admit: 2012-02-17 | Discharge: 2012-02-17 | Disposition: A | Discharge status: Home / Self Care | Payer: Medicare Other | Source: Ambulatory Visit | Attending: Neurology | Admitting: Neurology

## 2012-02-17 DIAGNOSIS — H532 Diplopia: Secondary | ICD-10-CM | POA: Diagnosis not present

## 2012-02-17 DIAGNOSIS — R269 Unspecified abnormalities of gait and mobility: Secondary | ICD-10-CM | POA: Diagnosis not present

## 2012-02-17 DIAGNOSIS — G7000 Myasthenia gravis without (acute) exacerbation: Secondary | ICD-10-CM | POA: Diagnosis not present

## 2012-02-19 ENCOUNTER — Ambulatory Visit (INDEPENDENT_AMBULATORY_CARE_PROVIDER_SITE_OTHER): Payer: Medicare Other | Admitting: Surgery

## 2012-02-19 ENCOUNTER — Encounter (INDEPENDENT_AMBULATORY_CARE_PROVIDER_SITE_OTHER): Payer: Self-pay | Admitting: Surgery

## 2012-02-19 ENCOUNTER — Telehealth (INDEPENDENT_AMBULATORY_CARE_PROVIDER_SITE_OTHER): Payer: Self-pay | Admitting: *Deleted

## 2012-02-19 VITALS — BP 138/82 | HR 42 | Temp 97.8°F | Resp 20 | Ht 64.0 in | Wt 209.0 lb

## 2012-02-19 DIAGNOSIS — D483 Neoplasm of uncertain behavior of retroperitoneum: Secondary | ICD-10-CM

## 2012-02-19 DIAGNOSIS — D484 Neoplasm of uncertain behavior of peritoneum: Secondary | ICD-10-CM

## 2012-02-19 NOTE — Telephone Encounter (Signed)
I will make Dr.Rehman aware. 

## 2012-02-19 NOTE — Telephone Encounter (Signed)
Charlcie LM to let Dr. Karilyn Cota know she is having surgery on 03/01/12 with Dr. Ileene Rubens.

## 2012-02-19 NOTE — Patient Instructions (Signed)
[facility]  OPEN ABDOMINAL SURGERY: POST OP INSTRUCTIONS  Always review your discharge instruction sheet given to you by the facility where your surgery was performed.  1. A prescription for pain medication may be given to you upon discharge.  Take your pain medication as prescribed.  If narcotic pain medicine is not needed, then you may take acetaminophen (Tylenol) or ibuprofen (Advil) as needed. 2. Take your usually prescribed medications unless otherwise directed. 3. If you need a refill on your pain medication, please contact your pharmacy. They will contact our office to request authorization.  Prescriptions will not be filled after 5 pm or on weekends. 4. You should follow a light diet the first few days after arrival home, such as soup and crackers, unless your doctor has advised otherwise. A high-fiber, low fat diet can be resumed as tolerated.  Be sure to include plenty of fluids daily.  5. Most patients will experience some swelling and bruising in the area of the incision. Ice packs will help. Swelling and bruising can take several days to resolve. 6. It is common to experience some constipation if taking pain medication after surgery.  Increasing fluid intake and taking a stool softener will usually help or prevent this problem from occurring.  A mild laxative (Milk of Magnesia or Miralax) should be taken according to package directions if there are no bowel movements after 48 hours. 7.  You may have steri-strips (small skin tapes) in place directly over the incision.  These strips should be left on the skin for 7-10 days.  If your surgeon used skin glue on the incision, you may shower in 24 hours.  The glue will flake off over the next 2-3 weeks.  Any sutures or staples will be removed at the office during your follow-up visit. You may find that a light gauze bandage over your incision may keep your staples from being rubbed or pulled. You may shower and replace the bandage  daily. 8. ACTIVITIES:  You may resume regular (light) daily activities beginning the next day-such as daily self-care, walking, climbing stairs-gradually increasing activities as tolerated.  You may have sexual intercourse when it is comfortable.  Refrain from any heavy lifting or straining until approved by your doctor.  You may drive when you no longer are taking prescription pain medication, you can comfortably wear a seatbelt, and you can safely maneuver your car and apply brakes. 9. You should see your doctor in the office for a follow-up appointment approximately two weeks after your surgery.  Make sure that you call for this appointment within a day or two after you arrive home to insure a convenient appointment time.  WHEN TO CALL YOUR DOCTOR: 1. Fever greater than 101.0 2. Inability to urinate 3. Persistent nausea and/or vomiting 4. Extreme swelling or bruising 5. Continued bleeding from incision 6. Increased pain, redness, or drainage from the incision 7. Difficulty swallowing or breathing 8. Muscle cramping or spasms 9. Numbness or tingling in hands or around lips  IF YOU HAVE DISABILITY OR FAMILY LEAVE FORMS, YOU MUST BRING THEM TO THE OFFICE FOR PROCESSING.  PLEASE DO NOT GIVE THEM TO YOUR DOCTOR.  The clinic staff is available to answer your questions during regular business hours.  Please don't hesitate to call and ask to speak to one of the nurses if you have concerns.  [facility] Office: [phone]  For further questions, please visit [email or web address]   

## 2012-02-19 NOTE — Progress Notes (Signed)
General Surgery - [facility]  Chief Complaint  Patient presents with  . New Evaluation    neuroendocrine tumor of retroperitoneum - referral by Dr. 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 Patient had developed some crampy type abdominal pain. She was evaluated by her gastroenterologist. She underwent a CT scan of the abdomen and pelvis which showed an enlarged mass in the left para-aortic location. Followup CT scan showed interval enlargement. Patient underwent a CT-guided core needle biopsy which showed a low-grade neoplasm with neuroendocrine differentiation. Extensive workup was undertaken including blood test and urine test to rule out pheochromocytoma. These were negative. Patient underwent colonoscopy and capsule endoscopy, both of which were unrevealing. PET scan demonstrated hypermetabolic activity in the mass. Also noted was increase activity in the thyroid but a thyroid ultrasound showed no mass lesions and was felt to be consistent with thyroiditis. Patient is now referred for consideration for laparotomy for excision of neuroendocrine tumor of the left retroperitoneum for definitive diagnosis.  Patient has previously undergone vaginal hysterectomy and open cholecystectomy. She has no prior history of endocrinopathy. There is no other history of endocrine neoplasm in the patient or in immediate family members.  Past Medical History  Diagnosis Date  . Unspecified essential hypertension   . Mixed hyperlipidemia   . Obesity, unspecified   . Esophageal reflux   . Hypertension     over 10 yrs  . Hypothyroidism   . Migraines   . Osteoarthritis of knee     both knees  . IBS (irritable colon syndrome)   . Complication of anesthesia     Low blood pressure, heart rate, O2 sat  . Ocular myasthenia gravis       Current Outpatient Prescriptions  Medication Sig Dispense Refill  . albuterol (PROVENTIL HFA;VENTOLIN HFA) 108 (90 BASE) MCG/ACT inhaler Inhale 2 puffs into the lungs every 6 (six) hours as needed. For wheezing      . ALPRAZolam (XANAX) 1 MG tablet Take 1 mg by mouth at bedtime as needed. HS as needed and can be repeated x 1 if needed.      . aspirin 81 MG tablet Take 81 mg by mouth daily.        . beta carotene w/minerals (OCUVITE) tablet Take 1 tablet by mouth daily.        . bifidobacterium infantis (ALIGN) capsule Take 1 capsule by mouth daily.  14 capsule  0  . calcium carbonate (OS-CAL) 600 MG TABS Take 600 mg by mouth 2 (two) times daily with a meal.        . cholecalciferol (VITAMIN D) 1000 UNITS tablet Take 1,000 Units by mouth daily.      . diclofenac (VOLTAREN) 50 MG EC tablet Take 50 mg by mouth daily.      . dicyclomine (BENTYL) 10 MG capsule Take 10 mg by mouth daily.      . docusate sodium (COLACE) 100 MG capsule Take 100 mg by mouth daily as needed. For constipation      . glucosamine-chondroitin 500-400 MG tablet Take 1 tablet by mouth 2 (two) times daily.       . HYDROcodone-acetaminophen (NORCO) 10-325 MG per tablet Take 1 tablet by mouth every 6 (six) hours as needed. pain      . HYDROcodone-homatropine (HYCODAN) 5-1.5 MG/5ML syrup Take 5 mLs by mouth every 6 (  six) hours as needed. For cough      . lansoprazole (PREVACID) 30 MG capsule Take 30 mg by mouth 2 (two) times daily before a meal.      . levothyroxine (SYNTHROID, LEVOTHROID) 112 MCG tablet Take 112 mcg by mouth daily.      . lidocaine (LIDODERM) 5 % Place 1 patch onto the skin daily as needed. Remove & Discard patch within 12 hours or as directed by MD for pain      . pravastatin (PRAVACHOL) 80 MG tablet Take 80 mg by mouth at bedtime.       . propranolol (INDERAL) 40 MG tablet Take 40 mg by mouth 2 (two) times daily.       . pyridostigmine (MESTINON) 60 MG tablet Take 30 mg by mouth 2 (two) times daily.        . SUMAtriptan (IMITREX) 50 MG tablet Take 50 mg by mouth every 2 (two) hours as needed. For migraines      . triamcinolone ointment (KENALOG) 0.5 % Apply 1 application topically 2 (two) times daily as needed. To affected area      . vitamin B-12 (CYANOCOBALAMIN) 500 MCG tablet Take 500 mcg by mouth daily.          Allergies  Allergen Reactions  . Macrodantin (Nitrofurantoin Macrocrystal) Nausea And Vomiting  . Clarithromycin Nausea And Vomiting  . Clarithromycin   . Codeine     Can not take Codeine unless in cough syrup  . Nitrofurantoin Nausea And Vomiting  . Tetracyclines & Related Nausea And Vomiting  . Ampicillin Rash  . Penicillins Rash     Family History  Problem Relation Age of Onset  . Inflammatory bowel disease Maternal Grandmother      History   Social History  . Marital Status: Divorced    Spouse Name: N/A    Number of Children: N/A  . Years of Education: N/A   Social History Main Topics  . Smoking status: Never Smoker   . Smokeless tobacco: Never Used  . Alcohol Use: Yes     Comment: one glass wine two to three times a year  . Drug Use: No  . Sexually Active: None   Other Topics Concern  . None   Social History Narrative  . None     REVIEW OF SYSTEMS - PERTINENT POSITIVES ONLY: Intermittent crampy abdominal pain, history of gastroesophageal reflux. No bleeding per rectum. No signs or symptoms of carcinoid syndrome.  EXAM: Filed Vitals:   02/19/12 0940  BP: 138/82  Pulse: 42  Temp: 97.8 F (36.6 C)  Resp: 20    HEENT: normocephalic; pupils equal and reactive; sclerae clear; dentition good; mucous membranes moist NECK:  symmetric on extension; no palpable anterior or posterior cervical lymphadenopathy; no supraclavicular masses; no tenderness CHEST: clear to auscultation bilaterally without rales, rhonchi, or wheezes CARDIAC: regular rate and rhythm without significant murmur; peripheral pulses are full ABDOMEN: soft without distension;  bowel sounds present; no mass; no hepatosplenomegaly; no hernia; well-healed right subcostal incision EXT:  non-tender without edema; no deformity NEURO: no gross focal deficits; no sign of tremor   LABORATORY RESULTS: See [facility] ([facility]) for most recent results   RADIOLOGY RESULTS: See [facility] ([facility]Epic) for most recent results   IMPRESSION: Neuro endocrine tumor of uncertain behavior, left retroperitoneum, interval enlargement on sequential CT scans  PLAN: I discussed the above findings at length with the patient and her daughter. We will review the pathology reports. I have recommended   surgical resection for definitive diagnosis. Given the location of the tumor she will require laparotomy. We have discussed the procedure and the hospital stay to be anticipated. We have discussed potential complications including bleeding and neurologic complications. Patient wishes to proceed in the near future. We will make arrangements for surgery at a time convenient for the patient and her family.  The risks and benefits of the procedure have been discussed at length with the patient.  The patient understands the proposed procedure, potential alternative treatments, and the course of recovery to be expected.  All of the patient's questions have been answered at this time.  The patient wishes to proceed with surgery.  Brandyce Dimario M. Samone Guhl, MD, FACS General & Endocrine Surgery [facility]   Visit Diagnoses: 1. Neoplasm of uncertain behavior of retroperitoneum, left para-aortic mass     Primary Care Physician: HALL,ZACK, MD   

## 2012-02-20 ENCOUNTER — Ambulatory Visit (HOSPITAL_COMMUNITY): Payer: Medicare Other

## 2012-02-20 NOTE — Telephone Encounter (Signed)
Dr.Rehman was made aware. 

## 2012-02-23 ENCOUNTER — Ambulatory Visit (HOSPITAL_COMMUNITY)
Admission: RE | Admit: 2012-02-23 | Discharge: 2012-02-23 | Disposition: A | Discharge status: Home / Self Care | Payer: Medicare Other | Source: Ambulatory Visit | Attending: Internal Medicine | Admitting: Internal Medicine

## 2012-02-23 ENCOUNTER — Encounter (HOSPITAL_COMMUNITY): Payer: Medicare Other | Attending: Oncology | Admitting: Oncology

## 2012-02-23 ENCOUNTER — Encounter (HOSPITAL_COMMUNITY): Payer: Self-pay | Admitting: Oncology

## 2012-02-23 ENCOUNTER — Encounter (HOSPITAL_COMMUNITY): Payer: Self-pay | Admitting: Pharmacy Technician

## 2012-02-23 VITALS — BP 137/72 | HR 48 | Temp 97.7°F | Resp 18 | Wt 210.8 lb

## 2012-02-23 DIAGNOSIS — D3A00 Benign carcinoid tumor of unspecified site: Secondary | ICD-10-CM | POA: Insufficient documentation

## 2012-02-23 DIAGNOSIS — D483 Neoplasm of uncertain behavior of retroperitoneum: Secondary | ICD-10-CM

## 2012-02-23 DIAGNOSIS — D3A098 Benign carcinoid tumors of other sites: Secondary | ICD-10-CM | POA: Diagnosis not present

## 2012-02-23 DIAGNOSIS — Z1231 Encounter for screening mammogram for malignant neoplasm of breast: Secondary | ICD-10-CM | POA: Diagnosis not present

## 2012-02-23 DIAGNOSIS — R946 Abnormal results of thyroid function studies: Secondary | ICD-10-CM | POA: Insufficient documentation

## 2012-02-23 DIAGNOSIS — Z139 Encounter for screening, unspecified: Secondary | ICD-10-CM

## 2012-02-23 NOTE — Progress Notes (Signed)
Problem #1 neuroendocrine tumor of the para-aortic/paraspinous area of the lumbar region which is either paraganglioma, carcinoid, or pheochromocytoma though she has no secretory evidence for catecholamines or VMAs. She brought her one daughter with her today and asked a few questions. We will see her after her surgery. I went over her scans with her and her daughter again. They wanted to see them again as well as asked again a few questions which I hope answered to their satisfaction. She is ready for surgery on the 13th.

## 2012-02-27 NOTE — Patient Instructions (Addendum)
[name]  02/27/2012   Your procedure is scheduled on: 03/01/12   Report to [facility] at 0900 AM.  Call this number if you have problems the morning of surgery: [phone]   Remember:   Do not eat food:After Midnight.  May have clear liquids:until Midnight .  Marland Kitchen  Take these medicines the morning of surgery with A SIP OF WATER:    Do not wear jewelry, make-up or nail polish.  Do not wear lotions, powders, or perfumes.   Do not shave 48 hours prior to surgery.   Do not bring valuables to the hospital.  Contacts, dentures or bridgework may not be worn into surgery.  Leave suitcase in the car. After surgery it may be brought to your room.  For patients admitted to the hospital, checkout time is 11:00 AM the day of discharge.              SEE CHG INSTRUCTION SHEET   Please read over the following fact sheets that you were given: MRSA Information, coughing and deep breathing exercises, leg exercises, Blood Transfusion Fact Sheet               FAILURE TO COMPLY WITH THESE INSTRUCTIONS MAY RESULTS IN CANCELLATION OF YOUR SURGERY.                PATIENT SIGNATURE _________________________________________________________________                         NURSE SIGNATURE __________________________________________________________________

## 2012-02-28 ENCOUNTER — Encounter (HOSPITAL_COMMUNITY): Payer: Self-pay

## 2012-02-28 ENCOUNTER — Encounter (HOSPITAL_COMMUNITY)
Admission: RE | Admit: 2012-02-28 | Discharge: 2012-02-28 | Disposition: A | Discharge status: Home / Self Care | Payer: Medicare Other | Source: Ambulatory Visit | Attending: Surgery | Admitting: Surgery

## 2012-02-28 DIAGNOSIS — D483 Neoplasm of uncertain behavior of retroperitoneum: Secondary | ICD-10-CM | POA: Diagnosis not present

## 2012-02-28 DIAGNOSIS — I10 Essential (primary) hypertension: Secondary | ICD-10-CM | POA: Diagnosis not present

## 2012-02-28 DIAGNOSIS — E785 Hyperlipidemia, unspecified: Secondary | ICD-10-CM | POA: Diagnosis not present

## 2012-02-28 DIAGNOSIS — E669 Obesity, unspecified: Secondary | ICD-10-CM | POA: Diagnosis not present

## 2012-02-28 HISTORY — DX: Anemia, unspecified: D64.9

## 2012-02-28 LAB — CBC
HCT: 39.4 % (ref 36.0–46.0)
Hemoglobin: 13.3 g/dL (ref 12.0–15.0)
MCH: 33.3 pg (ref 26.0–34.0)
MCHC: 33.8 g/dL (ref 30.0–36.0)
MCV: 98.5 fL (ref 78.0–100.0)
RBC: 4 MIL/uL (ref 3.87–5.11)

## 2012-02-28 LAB — BASIC METABOLIC PANEL
BUN: 19 mg/dL (ref 6–23)
CO2: 30 mEq/L (ref 19–32)
Calcium: 10.1 mg/dL (ref 8.4–10.5)
GFR calc non Af Amer: 48 mL/min — ABNORMAL LOW (ref >90)
Glucose, Bld: 93 mg/dL (ref 70–99)

## 2012-02-28 LAB — SURGICAL PCR SCREEN: MRSA, PCR: NEGATIVE

## 2012-02-28 NOTE — Progress Notes (Signed)
Quick Note:  These results are acceptable for scheduled surgery.  Keondria Siever M. Ashani Pumphrey, MD, FACS [facility] Office: [phone]   ______ 

## 2012-02-28 NOTE — Anesthesia Preprocedure Evaluation (Signed)
Anesthesia Evaluation    Airway Mallampati: II TM Distance: >3 FB Neck ROM: Full    Dental  (+) Teeth Intact and Dental Advisory Given   Pulmonary shortness of breath and with exertion, pneumonia -, resolved,  breath sounds clear to auscultation  Pulmonary exam normal       Cardiovascular hypertension, Pt. on medications and Pt. on home beta blockers - CAD and - Past MI Rhythm:Regular Rate:Normal  No hx of CAD. Workup negative in past and pt put on PPI. Symptoms improved. Beta blocker for migraines and are likely the reason for sinus brady.   Neuro/Psych  Headaches, PSYCHIATRIC DISORDERS Anxiety High dose beta blocker for migraine prophylaxis. Resulting in sinus bradycardia.  Ocular myasthenia gravis. No peripheral symptoms. Low dose pyridostigmine.  Neuromuscular disease    GI/Hepatic Neg liver ROS, GERD-  Medicated,  Endo/Other  Hypothyroidism   Renal/GU negative Renal ROS     Musculoskeletal negative musculoskeletal ROS (+)   Abdominal (+) + obese,   Peds  Hematology negative hematology ROS (+)   Anesthesia Other Findings   Reproductive/Obstetrics                           Anesthesia Physical Anesthesia Plan  ASA: III  Anesthesia Plan: General   Post-op Pain Management:    Induction: Intravenous  Airway Management Planned: Oral ETT  Additional Equipment:   Intra-op Plan:   Post-operative Plan:   Informed Consent: I have reviewed the patients History and Physical, chart, labs and discussed the procedure including the risks, benefits and alternatives for the proposed anesthesia with the patient or authorized representative who has indicated his/her understanding and acceptance.   Dental advisory given  Plan Discussed with: CRNA  Anesthesia Plan Comments: (Epinephrine bolus (low dose) and possibly infusion may be necessary if hypotension and bradycardia recur postop. Also, could  consider beta blocker reversal in emergency situation with glucagon.  )        Anesthesia Quick Evaluation

## 2012-02-28 NOTE — Progress Notes (Signed)
Dr Renold Don in to see patient for anesthesia consult.  Anesthesia instructed patient to take betablocker am of surgery and myasthenia gravis med. Patient voiced understanding.  Dr Renold Don aware of ekg and heart rate at time of preop appointment.

## 2012-03-01 ENCOUNTER — Inpatient Hospital Stay (HOSPITAL_COMMUNITY)
Admission: RE | Admit: 2012-03-01 | Discharge: 2012-03-04 | DRG: 358 | Disposition: A | Discharge status: Home / Self Care | Payer: Medicare Other | Source: Ambulatory Visit | Attending: Surgery | Admitting: Surgery

## 2012-03-01 ENCOUNTER — Encounter (HOSPITAL_COMMUNITY)
Admission: RE | Disposition: A | Discharge status: Home / Self Care | Payer: Self-pay | Source: Ambulatory Visit | Attending: Surgery

## 2012-03-01 ENCOUNTER — Ambulatory Visit (HOSPITAL_COMMUNITY): Payer: Medicare Other | Admitting: Anesthesiology

## 2012-03-01 ENCOUNTER — Encounter (HOSPITAL_COMMUNITY): Payer: Self-pay | Admitting: *Deleted

## 2012-03-01 ENCOUNTER — Encounter (HOSPITAL_COMMUNITY): Payer: Self-pay | Admitting: Anesthesiology

## 2012-03-01 DIAGNOSIS — Z6835 Body mass index (BMI) 35.0-35.9, adult: Secondary | ICD-10-CM | POA: Diagnosis not present

## 2012-03-01 DIAGNOSIS — K219 Gastro-esophageal reflux disease without esophagitis: Secondary | ICD-10-CM | POA: Diagnosis present

## 2012-03-01 DIAGNOSIS — Z01818 Encounter for other preprocedural examination: Secondary | ICD-10-CM | POA: Diagnosis not present

## 2012-03-01 DIAGNOSIS — Z01812 Encounter for preprocedural laboratory examination: Secondary | ICD-10-CM

## 2012-03-01 DIAGNOSIS — E785 Hyperlipidemia, unspecified: Secondary | ICD-10-CM | POA: Diagnosis present

## 2012-03-01 DIAGNOSIS — E669 Obesity, unspecified: Secondary | ICD-10-CM | POA: Diagnosis not present

## 2012-03-01 DIAGNOSIS — D200 Benign neoplasm of soft tissue of retroperitoneum: Secondary | ICD-10-CM | POA: Diagnosis not present

## 2012-03-01 DIAGNOSIS — Z880 Allergy status to penicillin: Secondary | ICD-10-CM

## 2012-03-01 DIAGNOSIS — G7000 Myasthenia gravis without (acute) exacerbation: Secondary | ICD-10-CM | POA: Diagnosis present

## 2012-03-01 DIAGNOSIS — R0602 Shortness of breath: Secondary | ICD-10-CM | POA: Diagnosis not present

## 2012-03-01 DIAGNOSIS — I10 Essential (primary) hypertension: Secondary | ICD-10-CM | POA: Diagnosis not present

## 2012-03-01 DIAGNOSIS — E039 Hypothyroidism, unspecified: Secondary | ICD-10-CM | POA: Diagnosis present

## 2012-03-01 DIAGNOSIS — D484 Neoplasm of uncertain behavior of peritoneum: Secondary | ICD-10-CM | POA: Diagnosis present

## 2012-03-01 DIAGNOSIS — M1710 Unilateral primary osteoarthritis, unspecified knee: Secondary | ICD-10-CM | POA: Diagnosis present

## 2012-03-01 DIAGNOSIS — R946 Abnormal results of thyroid function studies: Secondary | ICD-10-CM

## 2012-03-01 DIAGNOSIS — Z79899 Other long term (current) drug therapy: Secondary | ICD-10-CM | POA: Diagnosis not present

## 2012-03-01 DIAGNOSIS — D201 Benign neoplasm of soft tissue of peritoneum: Secondary | ICD-10-CM | POA: Diagnosis not present

## 2012-03-01 DIAGNOSIS — D3A00 Benign carcinoid tumor of unspecified site: Secondary | ICD-10-CM | POA: Diagnosis not present

## 2012-03-01 DIAGNOSIS — D483 Neoplasm of uncertain behavior of retroperitoneum: Secondary | ICD-10-CM | POA: Diagnosis not present

## 2012-03-01 DIAGNOSIS — D649 Anemia, unspecified: Secondary | ICD-10-CM | POA: Diagnosis not present

## 2012-03-01 DIAGNOSIS — D3A8 Other benign neuroendocrine tumors: Secondary | ICD-10-CM

## 2012-03-01 HISTORY — PX: LAPAROTOMY: SHX154

## 2012-03-01 LAB — CREATININE, SERUM
Creatinine, Ser: 0.99 mg/dL (ref 0.50–1.10)
GFR calc non Af Amer: 55 mL/min — ABNORMAL LOW (ref >90)

## 2012-03-01 LAB — CBC
HCT: 37.5 % (ref 36.0–46.0)
Hemoglobin: 12.6 g/dL (ref 12.0–15.0)
MCHC: 33.6 g/dL (ref 30.0–36.0)
RBC: 3.79 MIL/uL — ABNORMAL LOW (ref 3.87–5.11)

## 2012-03-01 SURGERY — LAPAROTOMY, EXPLORATORY
Anesthesia: General | Site: Abdomen | Wound class: Clean

## 2012-03-01 MED ORDER — CIPROFLOXACIN IN D5W 400 MG/200ML IV SOLN
400.0000 mg | INTRAVENOUS | Status: AC
  Administered 2012-03-01: 400 mg via INTRAVENOUS

## 2012-03-01 MED ORDER — PROMETHAZINE HCL 25 MG/ML IJ SOLN: 6.2500 mg | INTRAMUSCULAR | Status: DC | PRN

## 2012-03-01 MED ORDER — HYDROMORPHONE HCL PF 1 MG/ML IJ SOLN
INTRAMUSCULAR | Status: AC
  Filled 2012-03-01: qty 2

## 2012-03-01 MED ORDER — PANTOPRAZOLE SODIUM 20 MG PO TBEC
20.0000 mg | DELAYED_RELEASE_TABLET | Freq: Two times a day (BID) | ORAL | Status: DC
  Administered 2012-03-01 – 2012-03-02 (×3): 20 mg via ORAL
  Filled 2012-03-01 (×5): qty 1

## 2012-03-01 MED ORDER — ONDANSETRON HCL 4 MG/2ML IJ SOLN
INTRAMUSCULAR | Status: DC | PRN
  Administered 2012-03-01: 4 mg via INTRAVENOUS

## 2012-03-01 MED ORDER — ACETAMINOPHEN 10 MG/ML IV SOLN
INTRAVENOUS | Status: DC | PRN
  Administered 2012-03-01: 1000 mg via INTRAVENOUS

## 2012-03-01 MED ORDER — HYDROMORPHONE HCL PF 1 MG/ML IJ SOLN
0.2500 mg | INTRAMUSCULAR | Status: DC | PRN
  Administered 2012-03-01 (×4): 0.5 mg via INTRAVENOUS

## 2012-03-01 MED ORDER — MEPERIDINE HCL 50 MG/ML IJ SOLN: 6.2500 mg | INTRAMUSCULAR | Status: DC | PRN

## 2012-03-01 MED ORDER — PANTOPRAZOLE SODIUM 20 MG PO TBEC: 20.0000 mg | DELAYED_RELEASE_TABLET | Freq: Every day | ORAL | Status: DC

## 2012-03-01 MED ORDER — PROPRANOLOL HCL 40 MG PO TABS
40.0000 mg | ORAL_TABLET | Freq: Two times a day (BID) | ORAL | Status: DC
  Administered 2012-03-01 – 2012-03-02 (×3): 40 mg via ORAL
  Filled 2012-03-01 (×3): qty 1

## 2012-03-01 MED ORDER — ROCURONIUM BROMIDE 100 MG/10ML IV SOLN
INTRAVENOUS | Status: DC | PRN
  Administered 2012-03-01 (×2): 10 mg via INTRAVENOUS
  Administered 2012-03-01: 40 mg via INTRAVENOUS

## 2012-03-01 MED ORDER — PROPOFOL 10 MG/ML IV BOLUS
INTRAVENOUS | Status: DC | PRN
  Administered 2012-03-01: 120 mg via INTRAVENOUS

## 2012-03-01 MED ORDER — ACETAMINOPHEN 10 MG/ML IV SOLN
INTRAVENOUS | Status: AC
  Filled 2012-03-01: qty 100

## 2012-03-01 MED ORDER — GLYCOPYRROLATE 0.2 MG/ML IJ SOLN
INTRAMUSCULAR | Status: DC | PRN
  Administered 2012-03-01: .8 mg via INTRAVENOUS

## 2012-03-01 MED ORDER — HYDROCODONE-ACETAMINOPHEN 10-325 MG PO TABS
1.0000 | ORAL_TABLET | Freq: Four times a day (QID) | ORAL | Status: DC | PRN
  Administered 2012-03-02 – 2012-03-04 (×3): 1 via ORAL
  Filled 2012-03-01 (×3): qty 1

## 2012-03-01 MED ORDER — LACTATED RINGERS IV SOLN
INTRAVENOUS | Status: DC
Start: 2012-03-01 — End: 2012-03-04
  Administered 2012-03-01: 11:00:00 via INTRAVENOUS

## 2012-03-01 MED ORDER — LIDOCAINE HCL (CARDIAC) 20 MG/ML IV SOLN
INTRAVENOUS | Status: DC | PRN
  Administered 2012-03-01: 50 mg via INTRAVENOUS

## 2012-03-01 MED ORDER — CIPROFLOXACIN IN D5W 400 MG/200ML IV SOLN
INTRAVENOUS | Status: AC
  Filled 2012-03-01: qty 200

## 2012-03-01 MED ORDER — LACTATED RINGERS IV SOLN
INTRAVENOUS | Status: DC
  Administered 2012-03-01: 1000 mL via INTRAVENOUS

## 2012-03-01 MED ORDER — ALBUTEROL SULFATE HFA 108 (90 BASE) MCG/ACT IN AERS
2.0000 | INHALATION_SPRAY | Freq: Four times a day (QID) | RESPIRATORY_TRACT | Status: DC | PRN
  Filled 2012-03-01: qty 6.7

## 2012-03-01 MED ORDER — HYDROMORPHONE HCL PF 1 MG/ML IJ SOLN
1.0000 mg | INTRAMUSCULAR | Status: DC | PRN
  Administered 2012-03-01 – 2012-03-04 (×15): 1 mg via INTRAVENOUS
  Filled 2012-03-01 (×15): qty 1

## 2012-03-01 MED ORDER — SODIUM CHLORIDE 0.9 % IV SOLN: INTRAVENOUS | Status: DC

## 2012-03-01 MED ORDER — MIDAZOLAM HCL 5 MG/5ML IJ SOLN: INTRAMUSCULAR | Status: DC | PRN

## 2012-03-01 MED ORDER — ONDANSETRON HCL 4 MG/2ML IJ SOLN
4.0000 mg | Freq: Four times a day (QID) | INTRAMUSCULAR | Status: DC | PRN
  Administered 2012-03-02 – 2012-03-03 (×3): 4 mg via INTRAVENOUS
  Filled 2012-03-01 (×3): qty 2

## 2012-03-01 MED ORDER — SODIUM CHLORIDE 0.9 % IR SOLN
Status: DC | PRN
  Administered 2012-03-01: 2000 mL

## 2012-03-01 MED ORDER — ENOXAPARIN SODIUM 40 MG/0.4ML ~~LOC~~ SOLN
40.0000 mg | SUBCUTANEOUS | Status: DC
  Administered 2012-03-02 – 2012-03-04 (×3): 40 mg via SUBCUTANEOUS
  Filled 2012-03-01 (×3): qty 0.4

## 2012-03-01 MED ORDER — HYDROMORPHONE HCL PF 1 MG/ML IJ SOLN
INTRAMUSCULAR | Status: AC
  Filled 2012-03-01: qty 1

## 2012-03-01 MED ORDER — FENTANYL CITRATE 0.05 MG/ML IJ SOLN
INTRAMUSCULAR | Status: DC | PRN
  Administered 2012-03-01: 50 ug via INTRAVENOUS
  Administered 2012-03-01: 100 ug via INTRAVENOUS
  Administered 2012-03-01 (×2): 50 ug via INTRAVENOUS

## 2012-03-01 MED ORDER — KCL IN DEXTROSE-NACL 30-5-0.45 MEQ/L-%-% IV SOLN
INTRAVENOUS | Status: DC
  Administered 2012-03-01 – 2012-03-04 (×7): via INTRAVENOUS
  Filled 2012-03-01 (×8): qty 1000

## 2012-03-01 MED ORDER — ONDANSETRON HCL 4 MG PO TABS: 4.0000 mg | ORAL_TABLET | Freq: Four times a day (QID) | ORAL | Status: DC | PRN

## 2012-03-01 MED ORDER — PYRIDOSTIGMINE BROMIDE 60 MG PO TABS
30.0000 mg | ORAL_TABLET | Freq: Two times a day (BID) | ORAL | Status: DC
  Administered 2012-03-01 – 2012-03-04 (×6): 30 mg via ORAL
  Filled 2012-03-01 (×7): qty 0.5

## 2012-03-01 MED ORDER — ALPRAZOLAM 1 MG PO TABS
1.0000 mg | ORAL_TABLET | Freq: Every evening | ORAL | Status: DC | PRN
  Administered 2012-03-04: 1 mg via ORAL
  Filled 2012-03-01: qty 1

## 2012-03-01 MED ORDER — NEOSTIGMINE METHYLSULFATE 1 MG/ML IJ SOLN
INTRAMUSCULAR | Status: DC | PRN
  Administered 2012-03-01: 4 mg via INTRAVENOUS

## 2012-03-01 MED ORDER — HYDROMORPHONE HCL PF 1 MG/ML IJ SOLN
0.2500 mg | INTRAMUSCULAR | Status: DC | PRN
  Administered 2012-03-01 (×2): 0.5 mg via INTRAVENOUS

## 2012-03-01 SURGICAL SUPPLY — 42 items
APPLICATOR COTTON TIP 6IN STRL (MISCELLANEOUS) ×1 IMPLANT
BLADE EXTENDED COATED 6.5IN (ELECTRODE) ×1 IMPLANT
BLADE HEX COATED 2.75 (ELECTRODE) ×2 IMPLANT
CANISTER SUCTION 2500CC (MISCELLANEOUS) ×2 IMPLANT
CLIP TI MEDIUM 6 (CLIP) ×2 IMPLANT
CLOTH BEACON ORANGE TIMEOUT ST (SAFETY) ×2 IMPLANT
COVER MAYO STAND STRL (DRAPES) ×1 IMPLANT
DRAPE LAPAROSCOPIC ABDOMINAL (DRAPES) ×2 IMPLANT
DRAPE WARM FLUID 44X44 (DRAPE) ×1 IMPLANT
DRESSING SURGICEL FIBRLLR 1X2 (HEMOSTASIS) IMPLANT
DRSG SURGICEL FIBRILLAR 1X2 (HEMOSTASIS) ×2
ELECT REM PT RETURN 9FT ADLT (ELECTROSURGICAL) ×2
ELECTRODE REM PT RTRN 9FT ADLT (ELECTROSURGICAL) ×1 IMPLANT
GLOVE BIOGEL PI IND STRL 7.0 (GLOVE) ×1 IMPLANT
GLOVE BIOGEL PI INDICATOR 7.0 (GLOVE) ×1
GLOVE SURG ORTHO 8.0 STRL STRW (GLOVE) ×2 IMPLANT
GOWN STRL NON-REIN LRG LVL3 (GOWN DISPOSABLE) ×2 IMPLANT
GOWN STRL REIN XL XLG (GOWN DISPOSABLE) ×4 IMPLANT
KIT BASIN OR (CUSTOM PROCEDURE TRAY) ×2 IMPLANT
NS IRRIG 1000ML POUR BTL (IV SOLUTION) ×3 IMPLANT
PACK GENERAL/GYN (CUSTOM PROCEDURE TRAY) ×2 IMPLANT
SHEARS FOC LG CVD HARMONIC 17C (MISCELLANEOUS) ×1 IMPLANT
SPONGE GAUZE 4X4 12PLY (GAUZE/BANDAGES/DRESSINGS) ×1 IMPLANT
SPONGE LAP 18X18 X RAY DECT (DISPOSABLE) IMPLANT
STAPLER VISISTAT 35W (STAPLE) ×1 IMPLANT
SUCTION POOLE TIP (SUCTIONS) IMPLANT
SUT NOV 1 T60/GS (SUTURE) IMPLANT
SUT PDS AB 1 CT1 27 (SUTURE) ×2 IMPLANT
SUT SILK 2 0 (SUTURE)
SUT SILK 2 0 SH CR/8 (SUTURE) ×1 IMPLANT
SUT SILK 2-0 18XBRD TIE 12 (SUTURE) IMPLANT
SUT SILK 3 0 (SUTURE)
SUT SILK 3 0 SH CR/8 (SUTURE) ×1 IMPLANT
SUT SILK 3-0 18XBRD TIE 12 (SUTURE) IMPLANT
SUT VIC AB 2-0 CT2 27 (SUTURE) ×1 IMPLANT
SUT VICRYL 2 0 18  UND BR (SUTURE)
SUT VICRYL 2 0 18 UND BR (SUTURE) IMPLANT
TAPE CLOTH SURG 6X10 WHT LF (GAUZE/BANDAGES/DRESSINGS) ×1 IMPLANT
TOWEL OR 17X26 10 PK STRL BLUE (TOWEL DISPOSABLE) ×4 IMPLANT
TRAY FOLEY CATH 14FRSI W/METER (CATHETERS) IMPLANT
WATER STERILE IRR 1500ML POUR (IV SOLUTION) ×2 IMPLANT
YANKAUER SUCT BULB TIP NO VENT (SUCTIONS) IMPLANT

## 2012-03-01 NOTE — Brief Op Note (Signed)
03/01/2012  12:57 PM  PATIENT:  [name]  71 y.o. female  PRE-OPERATIVE DIAGNOSIS:  neuroendocrine tumor of retroperitoneum  POST-OPERATIVE DIAGNOSIS:  neuroendocrine tumor of retroperitoneum  PROCEDURE:  Procedure(s) (LRB) with comments: EXPLORATORY LAPAROTOMY (N/A) - Exploratory Laparotomy ,Resection Retroperitoneal Mass RESECTION OF RETROPERITONEAL MASS (N/A)  SURGEON:  Surgeon(s) and Role:    * Velora Heckler, MD - Primary  ANESTHESIA:   general  EBL:  Total I/O In: -  Out: 300 [Urine:300]  BLOOD ADMINISTERED:none  DRAINS: none   LOCAL MEDICATIONS USED:  NONE  SPECIMEN:  Excision  DISPOSITION OF SPECIMEN:  PATHOLOGY  COUNTS:  YES  TOURNIQUET:  * No tourniquets in log *  DICTATION: .Other Dictation: Dictation Number [id number]  PLAN OF CARE: Admit to inpatient   PATIENT DISPOSITION:  PACU - hemodynamically stable.   Delay start of Pharmacological VTE agent (>24hrs) due to surgical blood loss or risk of bleeding: yes  Velora Heckler, MD, FACS General & Endocrine Surgery [facility] Office: [phone]

## 2012-03-01 NOTE — H&P (View-Only) (Signed)
General Surgery [facility]  Chief Complaint  Patient presents with  . New Evaluation    neuroendocrine tumor of retroperitoneum - referral by Dr. 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 Patient had developed some crampy type abdominal pain. She was evaluated by her gastroenterologist. She underwent a CT scan of the abdomen and pelvis which showed an enlarged mass in the left para-aortic location. Followup CT scan showed interval enlargement. Patient underwent a CT-guided core needle biopsy which showed a low-grade neoplasm with neuroendocrine differentiation. Extensive workup was undertaken including blood test and urine test to rule out pheochromocytoma. These were negative. Patient underwent colonoscopy and capsule endoscopy, both of which were unrevealing. PET scan demonstrated hypermetabolic activity in the mass. Also noted was increase activity in the thyroid but a thyroid ultrasound showed no mass lesions and was felt to be consistent with thyroiditis. Patient is now referred for consideration for laparotomy for excision of neuroendocrine tumor of the left retroperitoneum for definitive diagnosis.  Patient has previously undergone vaginal hysterectomy and open cholecystectomy. She has no prior history of endocrinopathy. There is no other history of endocrine neoplasm in the patient or in immediate family members.  Past Medical History  Diagnosis Date  . Unspecified essential hypertension   . Mixed hyperlipidemia   . Obesity, unspecified   . Esophageal reflux   . Hypertension     over 10 yrs  . Hypothyroidism   . Migraines   . Osteoarthritis of knee     both knees  . IBS (irritable colon syndrome)   . Complication of anesthesia     Low blood pressure, heart rate, O2 sat  . Ocular myasthenia gravis       Current Outpatient Prescriptions  Medication Sig Dispense Refill  . albuterol (PROVENTIL HFA;VENTOLIN HFA) 108 (90 BASE) MCG/ACT inhaler Inhale 2 puffs into the lungs every 6 (six) hours as needed. For wheezing      . ALPRAZolam (XANAX) 1 MG tablet Take 1 mg by mouth at bedtime as needed. HS as needed and can be repeated x 1 if needed.      Marland Kitchen aspirin 81 MG tablet Take 81 mg by mouth daily.        . beta carotene w/minerals (OCUVITE) tablet Take 1 tablet by mouth daily.        . bifidobacterium infantis (ALIGN) capsule Take 1 capsule by mouth daily.  14 capsule  0  . calcium carbonate (OS-CAL) 600 MG TABS Take 600 mg by mouth 2 (two) times daily with a meal.        . cholecalciferol (VITAMIN D) 1000 UNITS tablet Take 1,000 Units by mouth daily.      . diclofenac (VOLTAREN) 50 MG EC tablet Take 50 mg by mouth daily.      Marland Kitchen dicyclomine (BENTYL) 10 MG capsule Take 10 mg by mouth daily.      Marland Kitchen docusate sodium (COLACE) 100 MG capsule Take 100 mg by mouth daily as needed. For constipation      . glucosamine-chondroitin 500-400 MG tablet Take 1 tablet by mouth 2 (two) times daily.       Marland Kitchen HYDROcodone-acetaminophen (NORCO) 10-325 MG per tablet Take 1 tablet by mouth every 6 (six) hours as needed. pain      . HYDROcodone-homatropine (HYCODAN) 5-1.5 MG/5ML syrup Take 5 mLs by mouth every 6 (  six) hours as needed. For cough      . lansoprazole (PREVACID) 30 MG capsule Take 30 mg by mouth 2 (two) times daily before a meal.      . levothyroxine (SYNTHROID, LEVOTHROID) 112 MCG tablet Take 112 mcg by mouth daily.      Marland Kitchen lidocaine (LIDODERM) 5 % Place 1 patch onto the skin daily as needed. Remove & Discard patch within 12 hours or as directed by MD for pain      . pravastatin (PRAVACHOL) 80 MG tablet Take 80 mg by mouth at bedtime.       . propranolol (INDERAL) 40 MG tablet Take 40 mg by mouth 2 (two) times daily.       Marland Kitchen pyridostigmine (MESTINON) 60 MG tablet Take 30 mg by mouth 2 (two) times daily.        . SUMAtriptan (IMITREX) 50 MG tablet Take 50 mg by mouth every 2 (two) hours as needed. For migraines      . triamcinolone ointment (KENALOG) 0.5 % Apply 1 application topically 2 (two) times daily as needed. To affected area      . vitamin B-12 (CYANOCOBALAMIN) 500 MCG tablet Take 500 mcg by mouth daily.          Allergies  Allergen Reactions  . Macrodantin (Nitrofurantoin Macrocrystal) Nausea And Vomiting  . Clarithromycin Nausea And Vomiting  . Clarithromycin   . Codeine     Can not take Codeine unless in cough syrup  . Nitrofurantoin Nausea And Vomiting  . Tetracyclines & Related Nausea And Vomiting  . Ampicillin Rash  . Penicillins Rash     Family History  Problem Relation Age of Onset  . Inflammatory bowel disease Maternal Grandmother      History   Social History  . Marital Status: Divorced    Spouse Name: N/A    Number of Children: N/A  . Years of Education: N/A   Social History Main Topics  . Smoking status: Never Smoker   . Smokeless tobacco: Never Used  . Alcohol Use: Yes     Comment: one glass wine two to three times a year  . Drug Use: No  . Sexually Active: None   Other Topics Concern  . None   Social History Narrative  . None     REVIEW OF SYSTEMS - PERTINENT POSITIVES ONLY: Intermittent crampy abdominal pain, history of gastroesophageal reflux. No bleeding per rectum. No signs or symptoms of carcinoid syndrome.  EXAM: Filed Vitals:   02/19/12 0940  BP: 138/82  Pulse: 42  Temp: 97.8 F (36.6 C)  Resp: 20    HEENT: normocephalic; pupils equal and reactive; sclerae clear; dentition good; mucous membranes moist NECK:  symmetric on extension; no palpable anterior or posterior cervical lymphadenopathy; no supraclavicular masses; no tenderness CHEST: clear to auscultation bilaterally without rales, rhonchi, or wheezes CARDIAC: regular rate and rhythm without significant murmur; peripheral pulses are full ABDOMEN: soft without distension;  bowel sounds present; no mass; no hepatosplenomegaly; no hernia; well-healed right subcostal incision EXT:  non-tender without edema; no deformity NEURO: no gross focal deficits; no sign of tremor   LABORATORY RESULTS: See [facility] (CHL-Epic) for most recent results   RADIOLOGY RESULTS: See [facility] (CHL-Epic) for most recent results   IMPRESSION: Neuro endocrine tumor of uncertain behavior, left retroperitoneum, interval enlargement on sequential CT scans  PLAN: I discussed the above findings at length with the patient and her daughter. We will review the pathology reports. I have recommended  surgical resection for definitive diagnosis. Given the location of the tumor she will require laparotomy. We have discussed the procedure and the hospital stay to be anticipated. We have discussed potential complications including bleeding and neurologic complications. Patient wishes to proceed in the near future. We will make arrangements for surgery at a time convenient for the patient and her family.  The risks and benefits of the procedure have been discussed at length with the patient.  The patient understands the proposed procedure, potential alternative treatments, and the course of recovery to be expected.  All of the patient's questions have been answered at this time.  The patient wishes to proceed with surgery.  Velora Heckler, MD, FACS General & Endocrine Surgery [facility]   Visit Diagnoses: 1. Neoplasm of uncertain behavior of retroperitoneum, left para-aortic mass     Primary Care Physician: Dwana Melena, MD

## 2012-03-01 NOTE — Anesthesia Postprocedure Evaluation (Signed)
  Anesthesia Post-op Note  Patient: [name]  Procedure(s) Performed: Procedure(s) (LRB): EXPLORATORY LAPAROTOMY (N/A) RESECTION OF RETROPERITONEAL MASS (N/A)  Patient Location: PACU  Anesthesia Type: General  Level of Consciousness: awake and alert   Airway and Oxygen Therapy: Patient Spontanous Breathing  Post-op Pain: mild  Post-op Assessment: Post-op Vital signs reviewed, Patient's Cardiovascular Status Stable, Respiratory Function Stable, Patent Airway and No signs of Nausea or vomiting  Last Vitals:  Filed Vitals:   03/01/12 1400  BP: 150/98  Pulse: 53  Temp:   Resp: 12    Post-op Vital Signs: stable   Complications: No apparent anesthesia complications

## 2012-03-01 NOTE — Transfer of Care (Signed)
Immediate Anesthesia Transfer of Care Note  Patient: [name]  Procedure(s) Performed: Procedure(s) (LRB): EXPLORATORY LAPAROTOMY (N/A) RESECTION OF RETROPERITONEAL MASS (N/A)  Patient Location: PACU  Anesthesia Type: General  Level of Consciousness: sedated, patient cooperative and responds to stimulaton  Airway & Oxygen Therapy: Patient Spontanous Breathing and Patient connected to face mask oxgen  Post-op Assessment: Report given to PACU RN and Post -op Vital signs reviewed and stable  Post vital signs: Reviewed and stable  Complications: No apparent anesthesia complications

## 2012-03-01 NOTE — Interval H&P Note (Signed)
History and Physical Interval Note:  03/01/2012 11:05 AM  [name]  has presented today for surgery, with the diagnosis of neuroendocrine tumor of retroperitoneum.  The various methods of treatment have been discussed with the patient and family. After consideration of risks, benefits and other options for treatment, the patient has consented to    Procedure(s) (LRB) with comments: EXPLORATORY LAPAROTOMY (N/A) - Exploratory Laparotomy ,Resection Retroperitoneal Mass RESECTION OF RETROPERITONEAL MASS (N/A) as a surgical intervention .    The patient's history has been reviewed, patient examined, no change in status, stable for surgery.  I have reviewed the patient's chart and labs.  Questions were answered to the patient's satisfaction.    Velora Heckler, MD, [facility] Office: [phone]    Fredrich Cory Judie Petit

## 2012-03-02 LAB — CBC
Hemoglobin: 12.8 g/dL (ref 12.0–15.0)
MCH: 32.6 pg (ref 26.0–34.0)
Platelets: 174 10*3/uL (ref 150–400)
RBC: 3.93 MIL/uL (ref 3.87–5.11)
WBC: 11.5 10*3/uL — ABNORMAL HIGH (ref 4.0–10.5)

## 2012-03-02 LAB — BASIC METABOLIC PANEL
CO2: 28 mEq/L (ref 19–32)
Calcium: 9.4 mg/dL (ref 8.4–10.5)
Chloride: 102 mEq/L (ref 96–112)
Glucose, Bld: 132 mg/dL — ABNORMAL HIGH (ref 70–99)
Potassium: 4.3 mEq/L (ref 3.5–5.1)
Sodium: 136 mEq/L (ref 135–145)

## 2012-03-02 MED ORDER — NON FORMULARY: 30.0000 mg | Freq: Two times a day (BID) | Status: DC

## 2012-03-02 MED ORDER — LEVOTHYROXINE SODIUM 112 MCG PO TABS
112.0000 ug | ORAL_TABLET | Freq: Every day | ORAL | Status: DC
  Administered 2012-03-02 – 2012-03-04 (×3): 112 ug via ORAL
  Filled 2012-03-02 (×4): qty 1

## 2012-03-02 MED ORDER — PROPRANOLOL HCL 40 MG PO TABS
40.0000 mg | ORAL_TABLET | Freq: Two times a day (BID) | ORAL | Status: DC
  Administered 2012-03-03 – 2012-03-04 (×3): 40 mg via ORAL
  Filled 2012-03-02 (×5): qty 1

## 2012-03-02 MED ORDER — LANSOPRAZOLE 30 MG PO CPDR
30.0000 mg | DELAYED_RELEASE_CAPSULE | Freq: Two times a day (BID) | ORAL | Status: DC
  Administered 2012-03-03 – 2012-03-04 (×3): 30 mg via ORAL
  Filled 2012-03-02 (×8): qty 1

## 2012-03-02 MED ORDER — ALUM & MAG HYDROXIDE-SIMETH 200-200-20 MG/5ML PO SUSP
30.0000 mL | ORAL | Status: DC | PRN
  Administered 2012-03-02: 30 mL via ORAL
  Filled 2012-03-02: qty 30

## 2012-03-02 MED ORDER — SUMATRIPTAN SUCCINATE 50 MG PO TABS
50.0000 mg | ORAL_TABLET | ORAL | Status: DC | PRN
  Administered 2012-03-02 – 2012-03-03 (×3): 50 mg via ORAL
  Filled 2012-03-02 (×3): qty 1

## 2012-03-02 NOTE — Progress Notes (Signed)
1 Day Post-Op  Subjective: Sore.  No N/V.  Objective: Vital signs in last 24 hours: Temp:  [97.5 F (36.4 C)-99.2 F (37.3 C)] 99.1 F (37.3 C) (12/14 0430) Pulse Rate:  [50-67] 62  (12/14 0430) Resp:  [10-19] 16  (12/14 0430) BP: (126-178)/(49-98) 147/60 mmHg (12/14 0430) SpO2:  [94 %-100 %] 99 % (12/14 0430) Weight:  [209 lb 11.2 oz (95.119 kg)] 209 lb 11.2 oz (95.119 kg) (12/13 1429)    Intake/Output from previous day: 12/13 0701 - 12/14 0700 In: 3141.7 [I.V.:3141.7] Out: 2190 [Urine:2190] Intake/Output this shift: Total I/O In: -  Out: 1150 [Urine:1150]  PE: General- In NAD Abdomen-soft, dressing dry, few bowel sounds.  Lab Results:   Basename 03/02/12 0419 03/01/12 1535  WBC 11.5* 16.2*  HGB 12.8 12.6  HCT 39.0 37.5  PLT 174 169   BMET  Basename 03/02/12 0419 03/01/12 1535 02/28/12 1055  NA 136 -- 140  K 4.3 -- 4.2  CL 102 -- 105  CO2 28 -- 30  GLUCOSE 132* -- 93  BUN 10 -- 19  CREATININE 0.81 0.99 --  CALCIUM 9.4 -- 10.1   PT/INR No results found for this basename: LABPROT:2,INR:2 in the last 72 hours Comprehensive Metabolic Panel:    Component Value Date/Time   NA 136 03/02/2012 0419   K 4.3 03/02/2012 0419   CL 102 03/02/2012 0419   CO2 28 03/02/2012 0419   BUN 10 03/02/2012 0419   CREATININE 0.81 03/02/2012 0419   CREATININE 1.18* 09/26/2011 1525   GLUCOSE 132* 03/02/2012 0419   CALCIUM 9.4 03/02/2012 0419   AST 19 12/06/2011 1735   ALT 13 12/06/2011 1735   ALKPHOS 66 12/06/2011 1735   BILITOT 0.4 12/06/2011 1735   PROT 7.5 12/06/2011 1735   ALBUMIN 3.8 12/06/2011 1735     Studies/Results: No results found.  Anti-infectives: Anti-infectives     Start     Dose/Rate Route Frequency Ordered Stop   03/01/12 0849   ciprofloxacin (CIPRO) IVPB 400 mg        400 mg 200 mL/hr over 60 Minutes Intravenous On call to O.R. 03/01/12 0849 03/01/12 1154          Assessment Principal Problem:  *Neoplasm of uncertain behavior of  retroperitoneum, left para-aortic mass-s/p excision 12/13:  Stable overnight.    LOS: 1 day   Plan: Clear liquids.  OOB.  Restart Synthroid.   [name] J 03/02/2012

## 2012-03-02 NOTE — Progress Notes (Signed)
Vitals obtained at 2115 heart rate at 52.  Dr Abbey Chatters paged and discussed patient receiving Propranolol at hs.  Order parameter set to hold with heart rate of 60 or below.  Patient does not want to miss this medication and adamant that the medication should be given. Discussed with patient and granddaughter at bedside that this med should not be given below 60.   She states she will get up and walk around for this nurse to recheck her heart rate.  When heart rate rechecked it was 67 and within parameters to give.  Med given at patient and granddaughter request.

## 2012-03-02 NOTE — Op Note (Signed)
NAMEDELAYLA, [name]                 ACCOUNT NO.:  [id number]  MEDICAL RECORD NO.:  [id number]  LOCATION:  1526                         FACILITY:  [facility]  PHYSICIAN:  Velora Heckler, MD      DATE OF BIRTH:  1940-12-18  DATE OF PROCEDURE:  03/01/2012                               OPERATIVE REPORT   PREOPERATIVE DIAGNOSIS:  Neuroendocrine tumor of retroperitoneum.  POSTOPERATIVE DIAGNOSIS:  Neuroendocrine tumor of retroperitoneum.  PROCEDURES: 1. Exploratory laparotomy. 2. Resection of retroperitoneal neuroendocrine tumor (3 x 2 x 2 cm).  SURGEON:  Velora Heckler, MD, FACS  ANESTHESIA:  General.  ESTIMATED BLOOD LOSS:  Minimal.  PREPARATION:  ChloraPrep.  COMPLICATIONS:  None.  INDICATIONS:  The patient is a 71 year old white female, retired Engineer, civil (consulting), referred by her primary care physician, and medical oncologist for evaluation of a retroperitoneal neuroendocrine tumor.  The patient had been evaluated for crampy-type abdominal pain by her gastroenterologist. She had undergone a CT scan of the abdomen and pelvis, which showed a mass in the left paraaortic location.  CT-guided core needle biopsy showed a low-grade neoplasm with neuroendocrine differentiation. Extensive workup included laboratory studies, urine studies, colonoscopy, and capsule endoscopy, and PET scanning.  The lesion was hypermetabolic by PET scan.  Other workup was negative for evidence of a hormonally active endocrine tumor.  The patient now comes to Surgery for resection for definitive diagnosis.  BODY OF REPORT:  Procedure was done in OR #1 at the [facility]  The patient was brought to the operating room and placed in supine position on the operating room table.  Following administration of general anesthesia, the patient was positioned and then prepped and draped in the usual strict aseptic fashion.  After ascertaining that an adequate level of anesthesia had been achieved, a midline  abdominal incision was made, centered around the umbilicus. Dissection was carried through the subcutaneous tissues.  Fascia was incised in the midline and the peritoneal cavity was entered cautiously. Abdomen was explored.  The liver was grossly normal.  There were few adhesions to her previous surgical wound in the right upper quadrant. Bowel appeared grossly normal.  Palpation of the retroperitoneum showed a normal-sized aorta without evidence of aneurysm.  To the left of midline at the level of the aortic bifurcation was a 3-cm mobile, firm mass.  This was exposed.  A Bookwalter retractor was placed to maintain exposure.  Using the electrocautery, the retroperitoneum was opened. The mass was mobilized and was largely resected using the Harmonic scalpel.  Vascular and lymphatic tributaries were divided between medium Ligaclips with the Harmonic scalpel.  The entire mass was excised and submitted fresh in saline to Pathology for review.  Good hemostasis was noted.  Fibrillar was placed in the operative field. Retroperitoneum was closed with a running 2-0 Vicryl suture.  Retractors were removed from viscera, returned to their normal anatomic locations. Good hemostasis was noted.  Midline surgical wound was closed with a running #1 PDS suture.  Subcutaneous tissues were irrigated.  Skin was closed with stainless steel staples.  Wound was washed and dried. Sterile dressings were applied.  The patient was awakened from anesthesia and  brought to the recovery room.  The patient tolerated the procedure well.   Velora Heckler, MD, [facility] Office: [phone]    TMG/MEDQ  D:  03/01/2012  T:  03/02/2012  Job:  [id number]  cc:   Ladona Horns. Mariel Sleet, MD Fax: [phone]  Catalina Pizza, M.D. Fax: [phone]  Lionel December, M.D. Fax: [phone]

## 2012-03-02 NOTE — Progress Notes (Signed)
Dr. Abbey Chatters aware via phone pt c/o of midsternal pain described as a dull ache. Pt stated " I need my Prevacid. It's the only thing that helps my reflux."  Pt's HOB elevated. See new orders received.

## 2012-03-03 LAB — BASIC METABOLIC PANEL
CO2: 30 mEq/L (ref 19–32)
Chloride: 99 mEq/L (ref 96–112)
Glucose, Bld: 133 mg/dL — ABNORMAL HIGH (ref 70–99)
Sodium: 136 mEq/L (ref 135–145)

## 2012-03-03 LAB — CBC
HCT: 37.5 % (ref 36.0–46.0)
Hemoglobin: 12.3 g/dL (ref 12.0–15.0)
MCH: 33 pg (ref 26.0–34.0)
MCV: 100.5 fL — ABNORMAL HIGH (ref 78.0–100.0)
Platelets: 148 10*3/uL — ABNORMAL LOW (ref 150–400)
RBC: 3.73 MIL/uL — ABNORMAL LOW (ref 3.87–5.11)
WBC: 10.3 10*3/uL (ref 4.0–10.5)

## 2012-03-03 NOTE — Progress Notes (Signed)
2 Days Post-Op  Subjective: Less sore.  Passing gas.  Heartburn better. Walking in hall.  Objective: Vital signs in last 24 hours: Temp:  [98 F (36.7 C)-99 F (37.2 C)] 98.5 F (36.9 C) (12/15 0540) Pulse Rate:  [52-76] 58  (12/15 0540) Resp:  [16-18] 18  (12/15 0540) BP: (138-167)/(72-82) 167/80 mmHg (12/15 0540) SpO2:  [94 %-98 %] 98 % (12/15 0540)    Intake/Output from previous day: 12/14 0701 - 12/15 0700 In: 3200 [P.O.:720; I.V.:2480] Out: 5950 [Urine:5950] Intake/Output this shift:    PE: General- In NAD Abdomen-soft, dressing dry  Lab Results:   [facility] 03/03/12 0436 03/02/12 0419  WBC 10.3 11.5*  HGB 12.3 12.8  HCT 37.5 39.0  PLT 148* 174   BMET  Basename 03/03/12 0436 03/02/12 0419  NA 136 136  K 4.2 4.3  CL 99 102  CO2 30 28  GLUCOSE 133* 132*  BUN 7 10  CREATININE 0.79 0.81  CALCIUM 9.3 9.4   PT/INR No results found for this basename: LABPROT:2,INR:2 in the last 72 hours Comprehensive Metabolic Panel:    Component Value Date/Time   NA 136 03/03/2012 0436   K 4.2 03/03/2012 0436   CL 99 03/03/2012 0436   CO2 30 03/03/2012 0436   BUN 7 03/03/2012 0436   CREATININE 0.79 03/03/2012 0436   CREATININE 1.18* 09/26/2011 1525   GLUCOSE 133* 03/03/2012 0436   CALCIUM 9.3 03/03/2012 0436   AST 19 12/06/2011 1735   ALT 13 12/06/2011 1735   ALKPHOS 66 12/06/2011 1735   BILITOT 0.4 12/06/2011 1735   PROT 7.5 12/06/2011 1735   ALBUMIN 3.8 12/06/2011 1735     Studies/Results: No results found.  Anti-infectives: Anti-infectives     Start     Dose/Rate Route Frequency Ordered Stop   03/01/12 0849   ciprofloxacin (CIPRO) IVPB 400 mg        400 mg 200 mL/hr over 60 Minutes Intravenous On call to O.R. 03/01/12 0849 03/01/12 1154          Assessment Principal Problem:  *Neoplasm of uncertain behavior of retroperitoneum, left para-aortic mass-s/p excision 12/13:  Bowel function starting to return.    LOS: 2 days   Plan: Full liquid diet.   Decrease IVF.   Loveta Dellis J 03/03/2012

## 2012-03-03 NOTE — Progress Notes (Signed)
Utilization Review Completed.[name] T12/15/2013   

## 2012-03-04 ENCOUNTER — Encounter (HOSPITAL_COMMUNITY): Payer: Self-pay | Admitting: Surgery

## 2012-03-04 ENCOUNTER — Telehealth (INDEPENDENT_AMBULATORY_CARE_PROVIDER_SITE_OTHER): Payer: Self-pay

## 2012-03-04 MED ORDER — HYDROCODONE-ACETAMINOPHEN 10-325 MG PO TABS: 1.0000 | ORAL_TABLET | ORAL | Status: DC | PRN

## 2012-03-04 NOTE — Progress Notes (Signed)
Pt to d/c home. AVS reviewed and "My Chart" discussed with pt. Pt capable of verbalizing medications and follow-up appointments. Remains hemodynamically stable. No signs and symptoms of distress. Educated pt to return to ER in the case of SOB, dizziness, or chest pain.  

## 2012-03-04 NOTE — Telephone Encounter (Signed)
LM at home # with date of Monday 12-23 10:00am  nurse only appt to ask for Lorie T.

## 2012-03-04 NOTE — Discharge Summary (Signed)
Physician Discharge Summary [facility]  Patient ID: [name] MRN: [id number] DOB/AGE: 1940-08-06 71 y.o.  Admit date: 03/01/2012 Discharge date: 03/04/2012  Admission Diagnoses:  Retroperitoneal mass  Discharge Diagnoses:  Principal Problem:  *Neoplasm of uncertain behavior of retroperitoneum, left para-aortic mass   Discharged Condition: good  Hospital Course: patient admitted following laparotomy and resection of retroperitoneal neuroendocrine tumor.  Post op course uncomplicated.  Ileus resolving and diet well tolerated with small BM this AM.  Prepared for discharge home POD#3.  Consults: None  Significant Diagnostic Studies: none  Treatments: surgery: laparotomy with resection of retroperitoneal tumor  Discharge Exam: Blood pressure 166/81, pulse 57, temperature 99 F (37.2 C), temperature source Oral, resp. rate 18, height 5\' 4"  (1.626 m), weight 209 lb 11.2 oz (95.119 kg), SpO2 95.00%. HEENT - clear Chest - clear bilat Cor - RRR Abd - soft without distension; BS present; wound clear and dry Ext - no edema  Disposition: Home with family   Discharge Orders    Future Appointments: Provider: Department: Dept Phone: Center:   03/27/2012 11:00 AM Velora Heckler, MD [facility], [place] [phone] None   04/08/2012 2:00 PM Randall An, MD [facility] [phone] None   06/10/2012 2:00 PM Malissa Hippo, MD [facility] [phone] None     Future Orders Please Complete By Expires   Diet - low sodium heart healthy      Increase activity slowly      No dressing needed      Discharge instructions      Comments:   [facility]  OPEN ABDOMINAL SURGERY: POST OP INSTRUCTIONS  Always review your discharge instruction sheet given to you by the facility where your surgery was performed.  A prescription for pain medication may be given to you upon discharge.  Take your pain medication  as prescribed.  If narcotic pain medicine is not needed, then you may take acetaminophen (Tylenol) or ibuprofen (Advil) as needed. Take your usually prescribed medications unless otherwise directed. If you need a refill on your pain medication, please contact your pharmacy. They will contact our office to request authorization.  Prescriptions will not be filled after 5 pm or on weekends. You should follow a light diet the first few days after arrival home, such as soup and crackers, unless your doctor has advised otherwise. A high-fiber, low fat diet can be resumed as tolerated.  Be sure to include plenty of fluids daily.  Most patients will experience some swelling and bruising in the area of the incision. Ice packs will help. Swelling and bruising can take several days to resolve. It is common to experience some constipation if taking pain medication after surgery.  Increasing fluid intake and taking a stool softener will usually help or prevent this problem from occurring.  A mild laxative (Milk of Magnesia or Miralax) should be taken according to package directions if there are no bowel movements after 48 hours.  You may have steri-strips (small skin tapes) in place directly over the incision.  These strips should be left on the skin for 7-10 days.  If your surgeon used skin glue on the incision, you may shower in 24 hours.  The glue will flake off over the next 2-3 weeks.  Any sutures or staples will be removed at the office during your follow-up visit. You may find that a light gauze bandage over your incision may keep your staples from  being rubbed or pulled. You may shower and replace the bandage daily. ACTIVITIES:  You may resume regular (light) daily activities beginning the next day-such as daily self-care, walking, climbing stairs-gradually increasing activities as tolerated.  You may have sexual intercourse when it is comfortable.  Refrain from any heavy lifting or straining until approved by  your doctor.  You may drive when you no longer are taking prescription pain medication, you can comfortably wear a seatbelt, and you can safely maneuver your car and apply brakes. You should see your doctor in the office for a follow-up appointment approximately two weeks after your surgery.  Make sure that you call for this appointment within a day or two after you arrive home to insure a convenient appointment time.  WHEN TO CALL YOUR DOCTOR: Fever greater than 101.0 Inability to urinate Persistent nausea and/or vomiting Extreme swelling or bruising Continued bleeding from incision Increased pain, redness, or drainage from the incision Difficulty swallowing or breathing Muscle cramping or spasms Numbness or tingling in hands or around lips  IF YOU HAVE DISABILITY OR FAMILY LEAVE FORMS, YOU MUST BRING THEM TO THE OFFICE FOR PROCESSING.  PLEASE DO NOT GIVE THEM TO YOUR DOCTOR.  The clinic staff is available to answer your questions during regular business hours.  Please don't hesitate to call and ask to speak to one of the nurses if you have concerns.  [facility] [place] Office: [phone]  For further questions, please visit [email or web address]       Medication List     As of 03/04/2012  1:11 PM    TAKE these medications         albuterol 108 (90 BASE) MCG/ACT inhaler   Commonly known as: PROVENTIL HFA;VENTOLIN HFA   Inhale 2 puffs into the lungs every 6 (six) hours as needed. For wheezing      ALPRAZolam 1 MG tablet   Commonly known as: XANAX   Take 1 mg by mouth at bedtime as needed. HS as needed and can be repeated x 1 if needed. insomnia      aspirin 81 MG tablet   Take 81 mg by mouth at bedtime.      bifidobacterium infantis capsule   Take 1 capsule by mouth daily.      CALTRATE 600+D PLUS MINERALS PO   Take 1 tablet by mouth 2 (two) times daily.      diclofenac 50 MG EC tablet   Commonly known as: VOLTAREN   Take 50 mg by mouth daily.       dicyclomine 10 MG capsule   Commonly known as: BENTYL   Take 10 mg by mouth daily.      Fish Oil 1200 MG Caps   Take 1,200 mg by mouth daily.      glucosamine-chondroitin 500-400 MG tablet   Take 1 tablet by mouth 2 (two) times daily.      HYDROcodone-acetaminophen 10-325 MG per tablet   Commonly known as: NORCO   Take 1 tablet by mouth every 6 (six) hours as needed. pain      HYDROcodone-acetaminophen 10-325 MG per tablet   Commonly known as: NORCO   Take 1 tablet by mouth every 4 (four) hours as needed for pain.      lansoprazole 30 MG capsule   Commonly known as: PREVACID   Take 30 mg by mouth 2 (two) times daily before a meal.      levothyroxine 112 MCG tablet   Commonly known as: SYNTHROID, LEVOTHROID  Take 112 mcg by mouth daily before breakfast.      lidocaine 5 %   Commonly known as: LIDODERM   Place 1 patch onto the skin daily as needed. Remove & Discard patch within 12 hours or as directed by MD for pain      multivitamin with minerals Tabs   Take 1 tablet by mouth daily.      pravastatin 80 MG tablet   Commonly known as: PRAVACHOL   Take 80 mg by mouth at bedtime.      propranolol 40 MG tablet   Commonly known as: INDERAL   Take 40 mg by mouth 2 (two) times daily.      psyllium 58.6 % powder   Commonly known as: METAMUCIL   Take 1 packet by mouth at bedtime. Takes one teaspoon      pyridostigmine 60 MG tablet   Commonly known as: MESTINON   Take 30 mg by mouth 2 (two) times daily.      sennosides-docusate sodium 8.6-50 MG tablet   Commonly known as: SENOKOT-S   Take 1 tablet by mouth daily as needed. constipation      SUMAtriptan 50 MG tablet   Commonly known as: IMITREX   Take 50 mg by mouth every 2 (two) hours as needed. For migraines      triamcinolone ointment 0.5 %   Commonly known as: KENALOG   Apply 1 application topically 2 (two) times daily as needed. Rash      vitamin B-12 500 MCG tablet   Commonly known as: CYANOCOBALAMIN    Take 500 mcg by mouth daily.      Vitamin D 2000 UNITS tablet   Take 2,000 Units by mouth daily.         Velora Heckler, MD, [facility] Office: [phone]   Signed: Velora Heckler 03/04/2012, 1:11 PM

## 2012-03-06 ENCOUNTER — Telehealth (INDEPENDENT_AMBULATORY_CARE_PROVIDER_SITE_OTHER): Payer: Self-pay

## 2012-03-06 NOTE — Telephone Encounter (Signed)
Pt notified of path result per Dr Gerkin's request. 

## 2012-03-11 ENCOUNTER — Encounter (INDEPENDENT_AMBULATORY_CARE_PROVIDER_SITE_OTHER): Payer: Self-pay | Admitting: General Surgery

## 2012-03-11 ENCOUNTER — Ambulatory Visit (INDEPENDENT_AMBULATORY_CARE_PROVIDER_SITE_OTHER): Payer: Medicare Other | Admitting: General Surgery

## 2012-03-11 VITALS — BP 138/82 | HR 48 | Temp 96.7°F | Ht 64.0 in | Wt 199.8 lb

## 2012-03-11 DIAGNOSIS — Z4802 Encounter for removal of sutures: Secondary | ICD-10-CM

## 2012-03-11 NOTE — Progress Notes (Signed)
Patient comes in today s/p exp lap by Dr.Gerkin on 03/01/12 for staple removal..patient's incision showed zero signs of redness, drainage or infection and staples were removed intact...benzoin and 1/2 steri-strips were then placed over the wound and patient handled very well...patient was told to wait until her appt with Dr.Gerkin on 03/27/12 @11 :00 to start working out with water aerobics and all other hard physical activity but increasing her walking was okay..patient was ok with all instructions and will call the office should she have any questions or concerns

## 2012-03-27 ENCOUNTER — Ambulatory Visit (INDEPENDENT_AMBULATORY_CARE_PROVIDER_SITE_OTHER): Payer: Medicare Other | Admitting: Surgery

## 2012-03-27 ENCOUNTER — Encounter (INDEPENDENT_AMBULATORY_CARE_PROVIDER_SITE_OTHER): Payer: Self-pay | Admitting: Surgery

## 2012-03-27 VITALS — BP 138/82 | HR 60 | Temp 98.8°F | Resp 18 | Ht 64.0 in | Wt 199.0 lb

## 2012-03-27 DIAGNOSIS — D483 Neoplasm of uncertain behavior of retroperitoneum: Secondary | ICD-10-CM

## 2012-03-27 NOTE — Progress Notes (Signed)
General Surgery [facility]  Visit Diagnoses: 1. Neoplasm of uncertain behavior of retroperitoneum, left para-aortic mass     HISTORY: Patient returns for her first postoperative visit having undergone exploratory laparotomy and resection of retroperitoneal mass on 03/01/2012. 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 There were no signs of malignancy.  EXAM: Abdomen is soft, nontender, without distention. Midline incision is healing nicely. No sign of infection. No sign of hernia. No palpable masses.  IMPRESSION: Status post resection of retroperitoneal paraganglioma  PLAN: Patient will begin applying topical creams to her incision. She will resume her physical activities. We have discussed restrictions over the next few weeks.  Patient will return as needed.  Velora Heckler, MD, FACS General & Endocrine Surgery [facility]

## 2012-03-27 NOTE — Patient Instructions (Signed)
  COCOA BUTTER & VITAMIN E CREAM  ([employer] or other brand)  Apply cocoa butter/vitamin E cream to your incision 2 - 3 times daily.  Massage cream into incision for one minute with each application.  Use sunscreen (50 SPF or higher) for first 6 months after surgery if area is exposed to sun.  You may substitute Mederma or other scar reducing creams as desired.   

## 2012-04-08 ENCOUNTER — Encounter (HOSPITAL_COMMUNITY): Payer: Self-pay | Admitting: Oncology

## 2012-04-08 ENCOUNTER — Encounter (HOSPITAL_COMMUNITY): Payer: Medicare Other | Attending: Oncology | Admitting: Oncology

## 2012-04-08 VITALS — BP 141/78 | HR 50 | Temp 98.8°F | Resp 18 | Wt 203.6 lb

## 2012-04-08 DIAGNOSIS — D447 Neoplasm of uncertain behavior of aortic body and other paraganglia: Secondary | ICD-10-CM | POA: Diagnosis not present

## 2012-04-08 DIAGNOSIS — R300 Dysuria: Secondary | ICD-10-CM | POA: Diagnosis not present

## 2012-04-08 LAB — URINALYSIS, ROUTINE W REFLEX MICROSCOPIC
Glucose, UA: NEGATIVE mg/dL
Leukocytes, UA: NEGATIVE
Specific Gravity, Urine: <1.005 — ABNORMAL LOW (ref 1.005–1.030)
pH: 6 (ref 5.0–8.0)

## 2012-04-08 LAB — URINE MICROSCOPIC-ADD ON

## 2012-04-08 NOTE — Patient Instructions (Addendum)
.  [facility] Discharge Instructions  RECOMMENDATIONS MADE BY THE CONSULTANT AND ANY TEST RESULTS WILL BE SENT TO YOUR REFERRING PHYSICIAN.  EXAM FINDINGS BY THE PHYSICIAN TODAY AND SIGNS OR SYMPTOMS TO REPORT TO CLINIC OR PRIMARY PHYSICIAN: urine check today   SPECIAL INSTRUCTIONS/FOLLOW-UP: To see [place] back in October after lab work and CT scans.  Thank you for choosing [facility] to provide your oncology and hematology care.  To afford each patient quality time with our providers, please arrive at least 15 minutes before your scheduled appointment time.  With your help, our goal is to use those 15 minutes to complete the necessary work-up to ensure our physicians have the information they need to help with your evaluation and healthcare recommendations.    Effective January 1st, 2014, we ask that you re-schedule your appointment with our physicians should you arrive 10 or more minutes late for your appointment.  We strive to give you quality time with our providers, and arriving late affects you and other patients whose appointments are after yours.    Again, thank you for choosing [facility].  Our hope is that these requests will decrease the amount of time that you wait before being seen by our physicians.       _____________________________________________________________  Should you have questions after your visit to [facility], please contact our office at [phone] between the hours of 8:30 a.m. and 5:00 p.m.  Voicemails left after 4:30 p.m. will not be returned until the following business day.  For prescription refill requests, have your pharmacy contact our office with your prescription refill request.

## 2012-04-08 NOTE — Progress Notes (Signed)
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 Gerrit Friends with negative margins. This was said to have low mitotic activity but interestingly was very active on PET scan.  She is doing very well but has urinary tract burning but no fever or chills. We'll check a urinalysis today.  I left a voicemail for her geneticist and we'll check to see if she needs any genetic testing. I do think we'll do a surveillance CT scans at the end of October 2014 seconds that will be one year from her PET scan.  Other than that I do not think we need to do more. If She is fine in October I think I will release her from this clinic.

## 2012-04-10 LAB — URINE CULTURE: Colony Count: 50000

## 2012-04-12 ENCOUNTER — Other Ambulatory Visit (HOSPITAL_COMMUNITY): Payer: Self-pay | Admitting: *Deleted

## 2012-04-23 ENCOUNTER — Other Ambulatory Visit (INDEPENDENT_AMBULATORY_CARE_PROVIDER_SITE_OTHER): Payer: Self-pay | Admitting: Internal Medicine

## 2012-05-04 ENCOUNTER — Other Ambulatory Visit: Payer: Self-pay

## 2012-05-15 DIAGNOSIS — G7000 Myasthenia gravis without (acute) exacerbation: Secondary | ICD-10-CM | POA: Diagnosis not present

## 2012-05-15 DIAGNOSIS — H532 Diplopia: Secondary | ICD-10-CM | POA: Diagnosis not present

## 2012-05-15 DIAGNOSIS — R269 Unspecified abnormalities of gait and mobility: Secondary | ICD-10-CM | POA: Diagnosis not present

## 2012-05-23 ENCOUNTER — Other Ambulatory Visit: Payer: Self-pay | Admitting: Neurology

## 2012-05-23 DIAGNOSIS — R269 Unspecified abnormalities of gait and mobility: Secondary | ICD-10-CM

## 2012-05-23 DIAGNOSIS — H9319 Tinnitus, unspecified ear: Secondary | ICD-10-CM

## 2012-05-28 DIAGNOSIS — E039 Hypothyroidism, unspecified: Secondary | ICD-10-CM | POA: Diagnosis not present

## 2012-05-28 DIAGNOSIS — E785 Hyperlipidemia, unspecified: Secondary | ICD-10-CM | POA: Diagnosis not present

## 2012-05-28 DIAGNOSIS — E782 Mixed hyperlipidemia: Secondary | ICD-10-CM | POA: Diagnosis not present

## 2012-05-28 DIAGNOSIS — K219 Gastro-esophageal reflux disease without esophagitis: Secondary | ICD-10-CM | POA: Diagnosis not present

## 2012-06-04 ENCOUNTER — Other Ambulatory Visit: Payer: Self-pay

## 2012-06-10 ENCOUNTER — Encounter (INDEPENDENT_AMBULATORY_CARE_PROVIDER_SITE_OTHER): Payer: Self-pay | Admitting: Internal Medicine

## 2012-06-10 ENCOUNTER — Ambulatory Visit (INDEPENDENT_AMBULATORY_CARE_PROVIDER_SITE_OTHER): Payer: Medicare Other | Admitting: Internal Medicine

## 2012-06-10 VITALS — BP 110/68 | HR 70 | Temp 98.2°F | Resp 18 | Ht 64.0 in | Wt 193.3 lb

## 2012-06-10 DIAGNOSIS — K219 Gastro-esophageal reflux disease without esophagitis: Secondary | ICD-10-CM | POA: Diagnosis not present

## 2012-06-10 DIAGNOSIS — R634 Abnormal weight loss: Secondary | ICD-10-CM | POA: Diagnosis not present

## 2012-06-10 DIAGNOSIS — K589 Irritable bowel syndrome without diarrhea: Secondary | ICD-10-CM | POA: Diagnosis not present

## 2012-06-10 MED ORDER — DICYCLOMINE HCL 10 MG PO CAPS: 10.0000 mg | ORAL_CAPSULE | Freq: Two times a day (BID) | ORAL | Status: DC | PRN

## 2012-06-10 NOTE — Progress Notes (Signed)
Presenting complaint;  Follow for IBS and GERD.  Subjective:  Patient is 73 year old Caucasian female who is here for scheduled visit accompanied by her daughter [name]. She was last seen 6 months ago. She hasn't had much abdominal pain since she had surgery with removal of retroperitoneal neoplasm which was paraganglioma. Lately she has not had a good appetite. She has lost 14 pounds since her last visit. She states she's been under a lot of stress. She's also getting home repairs done. She has not experienced nausea vomiting fever or chills. She's had more back pain since he's been doing some work at home. She has intermittent auscultation and urgency but she is not having spells of uncontrollable diarrhea that she used to. She is taking dicyclomine on a when necessary basis. Prevacid is controlling her heartburn but her co-pay has gone from $0 to $41 a month.  Current Medications: Current Outpatient Prescriptions  Medication Sig Dispense Refill  . ALPRAZolam (XANAX) 1 MG tablet Take 1 mg by mouth at bedtime as needed. HS as needed and can be repeated x 1 if needed. insomnia      . aspirin 81 MG tablet Take 81 mg by mouth at bedtime.       . bifidobacterium infantis (ALIGN) capsule Take 1 capsule by mouth daily.  14 capsule  0  . Calcium Carbonate-Vit D-Min (CALTRATE 600+D PLUS MINERALS PO) Take 1 tablet by mouth 2 (two) times daily.       . Cholecalciferol (VITAMIN D) 2000 UNITS tablet Take 2,000 Units by mouth daily.      . Coenzyme Q10 (COQ10) 30 MG CAPS Take by mouth daily.      . diclofenac (VOLTAREN) 50 MG EC tablet Take 50 mg by mouth daily.      Marland Kitchen dicyclomine (BENTYL) 10 MG capsule TAKE 1 CAPSULE TWICE DAILY  60 capsule  5  . HYDROcodone-acetaminophen (NORCO) 10-325 MG per tablet Take 1 tablet by mouth every 6 (six) hours as needed. pain      . lansoprazole (PREVACID) 30 MG capsule Take 30 mg by mouth at bedtime.       Marland Kitchen levothyroxine (SYNTHROID, LEVOTHROID) 112 MCG tablet Take 112 mcg  by mouth daily before breakfast.       . lidocaine (LIDODERM) 5 % Place 1 patch onto the skin daily as needed. Remove & Discard patch within 12 hours or as directed by MD for pain      . Multiple Vitamin (MULTIVITAMIN WITH MINERALS) TABS Take 1 tablet by mouth daily.      . Omega-3 Fatty Acids (FISH OIL) 1200 MG CAPS Take 1,200 mg by mouth 2 (two) times daily.       . pravastatin (PRAVACHOL) 80 MG tablet Take 40 mg by mouth at bedtime.       . propranolol (INDERAL) 40 MG tablet Take 40 mg by mouth 2 (two) times daily.       Marland Kitchen pyridostigmine (MESTINON) 60 MG tablet Take 30 mg by mouth 2 (two) times daily.       . sennosides-docusate sodium (SENOKOT-S) 8.6-50 MG tablet Take 1 tablet by mouth daily as needed. constipation      . SUMAtriptan (IMITREX) 50 MG tablet Take 50 mg by mouth every 2 (two) hours as needed. For migraines      . triamcinolone ointment (KENALOG) 0.5 % Apply 1 application topically 2 (two) times daily as needed. Rash      . vitamin B-12 (CYANOCOBALAMIN) 500 MCG tablet Place 500 mcg  under the tongue daily.        No current facility-administered medications for this visit.     Objective: Blood pressure 110/68, pulse 70, temperature 98.2 F (36.8 C), temperature source Oral, resp. rate 18, height 5\' 4"  (1.626 m), weight 193 lb 4.8 oz (87.68 kg). Patient is alert and in no acute distress. Conjunctiva is pink. Sclera is nonicteric Oropharyngeal mucosa is normal. No neck masses or thyromegaly noted. Cardiac exam with regular rhythm normal S1 and S2. No murmur or gallop noted. Lungs are clear to auscultation. Abdomen is full. No bruits noted. Abdomen is soft and nontender without organomegaly or masses the No LE edema or clubbing noted.   Assessment:  #1. Irritable bowel syndrome. Overall she is doing much better but she is having intermittent constipation and urgency. Poor oral intake and stress may have worsened her symptoms. She is up-to-date on her colonoscopies. Last  one was in June 2013. #2. GERD. Symptoms well-controlled with Prevacid but her co-pay is too high. Unless she can negotiate with her pharmacy we'll consider switching her to a different PPI. #3. Weight loss appears to be secondary to stressful situation relating to a family member. #4. History of retroperitoneal paraganglioma. Status post surgical resection in December 2013. She is scheduled to have surveillance CT in October 2014.   Plan:  New prescription given for dicyclomine 10 mg by mouth twice a day when necessary. Weight check in 8 weeks. Followup in 6 months.

## 2012-06-10 NOTE — Patient Instructions (Addendum)
Stop by at the office for weight check in 8 weeks. If co-pay on Prevacid or lansoprazole remains high will switch to pantoprazole; patient to call after checking with her pharmacy

## 2012-06-18 ENCOUNTER — Ambulatory Visit (INDEPENDENT_AMBULATORY_CARE_PROVIDER_SITE_OTHER): Payer: Medicare Other

## 2012-06-18 DIAGNOSIS — R269 Unspecified abnormalities of gait and mobility: Secondary | ICD-10-CM | POA: Diagnosis not present

## 2012-06-20 ENCOUNTER — Other Ambulatory Visit: Payer: Self-pay

## 2012-06-20 ENCOUNTER — Other Ambulatory Visit (HOSPITAL_COMMUNITY): Payer: Self-pay | Admitting: Internal Medicine

## 2012-06-20 DIAGNOSIS — M818 Other osteoporosis without current pathological fracture: Secondary | ICD-10-CM

## 2012-06-24 ENCOUNTER — Telehealth: Payer: Self-pay | Admitting: Neurology

## 2012-06-24 NOTE — Telephone Encounter (Signed)
I called patient. The carotid Doppler study was unremarkable. The patient has right-sided pulsatile tinnitus. The patient is to try to put some pressure on the right side of the neck to see if the sound goes away. This would indicate a venous source of the tinnitus.

## 2012-06-25 ENCOUNTER — Ambulatory Visit (HOSPITAL_COMMUNITY)
Admission: RE | Admit: 2012-06-25 | Discharge: 2012-06-25 | Disposition: A | Discharge status: Home / Self Care | Payer: Medicare Other | Source: Ambulatory Visit | Attending: Internal Medicine | Admitting: Internal Medicine

## 2012-06-25 DIAGNOSIS — M818 Other osteoporosis without current pathological fracture: Secondary | ICD-10-CM

## 2012-08-13 ENCOUNTER — Telehealth (INDEPENDENT_AMBULATORY_CARE_PROVIDER_SITE_OTHER): Payer: Self-pay | Admitting: *Deleted

## 2012-08-13 NOTE — Telephone Encounter (Signed)
Forwarded to Dr.Rehman. 

## 2012-08-13 NOTE — Telephone Encounter (Signed)
Patient on her office visit 06/10/12 weighed 193 lbs 4.8 oz Today at weigh in she weighed 188 lbs 8 oz

## 2012-08-13 NOTE — Telephone Encounter (Signed)
Weight check - 188.8 lbs

## 2012-08-14 NOTE — Telephone Encounter (Signed)
Weight check again in 2 months.

## 2012-08-14 NOTE — Telephone Encounter (Signed)
Patient called and made aware.

## 2012-09-18 ENCOUNTER — Telehealth (INDEPENDENT_AMBULATORY_CARE_PROVIDER_SITE_OTHER): Payer: Self-pay | Admitting: *Deleted

## 2012-09-18 NOTE — Telephone Encounter (Signed)
Patient came by for a weight check. Her weight was 189.6 lbs.

## 2012-09-19 ENCOUNTER — Other Ambulatory Visit: Payer: Self-pay | Admitting: Neurology

## 2012-09-19 ENCOUNTER — Telehealth (INDEPENDENT_AMBULATORY_CARE_PROVIDER_SITE_OTHER): Payer: Self-pay | Admitting: *Deleted

## 2012-09-19 NOTE — Telephone Encounter (Signed)
Will recheck weight in 3 months

## 2012-09-19 NOTE — Telephone Encounter (Signed)
Open in error

## 2012-09-19 NOTE — Telephone Encounter (Signed)
Patient presented to the office on 09/18/12 for a weight check, her weight was 189.6 lbs. At her last office visit in June she weighed 193 lbs 4.8 oz This will be forwarded to Dr.Rehman for review , if any further recommendations patient will be notified.

## 2012-09-19 NOTE — Telephone Encounter (Signed)
Being forwarded to Dr.Rehman

## 2012-09-19 NOTE — Telephone Encounter (Signed)
Patient called and made aware.

## 2012-10-23 ENCOUNTER — Other Ambulatory Visit: Payer: Self-pay

## 2012-11-18 ENCOUNTER — Other Ambulatory Visit (INDEPENDENT_AMBULATORY_CARE_PROVIDER_SITE_OTHER): Payer: Self-pay | Admitting: Internal Medicine

## 2012-11-27 DIAGNOSIS — Z96649 Presence of unspecified artificial hip joint: Secondary | ICD-10-CM | POA: Diagnosis not present

## 2012-11-27 DIAGNOSIS — Z96659 Presence of unspecified artificial knee joint: Secondary | ICD-10-CM | POA: Diagnosis not present

## 2012-11-27 DIAGNOSIS — Z471 Aftercare following joint replacement surgery: Secondary | ICD-10-CM | POA: Diagnosis not present

## 2012-11-27 DIAGNOSIS — M25559 Pain in unspecified hip: Secondary | ICD-10-CM | POA: Diagnosis not present

## 2012-11-28 DIAGNOSIS — I10 Essential (primary) hypertension: Secondary | ICD-10-CM | POA: Diagnosis not present

## 2012-11-28 DIAGNOSIS — E785 Hyperlipidemia, unspecified: Secondary | ICD-10-CM | POA: Diagnosis not present

## 2012-11-28 DIAGNOSIS — E039 Hypothyroidism, unspecified: Secondary | ICD-10-CM | POA: Diagnosis not present

## 2012-12-03 DIAGNOSIS — E782 Mixed hyperlipidemia: Secondary | ICD-10-CM | POA: Diagnosis not present

## 2012-12-03 DIAGNOSIS — K589 Irritable bowel syndrome without diarrhea: Secondary | ICD-10-CM | POA: Diagnosis not present

## 2012-12-03 DIAGNOSIS — E039 Hypothyroidism, unspecified: Secondary | ICD-10-CM | POA: Diagnosis not present

## 2012-12-03 DIAGNOSIS — K219 Gastro-esophageal reflux disease without esophagitis: Secondary | ICD-10-CM | POA: Diagnosis not present

## 2012-12-09 ENCOUNTER — Ambulatory Visit (INDEPENDENT_AMBULATORY_CARE_PROVIDER_SITE_OTHER): Payer: Medicare Other | Admitting: Internal Medicine

## 2012-12-09 ENCOUNTER — Encounter (INDEPENDENT_AMBULATORY_CARE_PROVIDER_SITE_OTHER): Payer: Self-pay | Admitting: Internal Medicine

## 2012-12-09 VITALS — BP 118/70 | HR 70 | Temp 98.6°F | Resp 18 | Ht 64.0 in | Wt 192.5 lb

## 2012-12-09 DIAGNOSIS — R0789 Other chest pain: Secondary | ICD-10-CM | POA: Diagnosis not present

## 2012-12-09 DIAGNOSIS — K589 Irritable bowel syndrome without diarrhea: Secondary | ICD-10-CM

## 2012-12-09 DIAGNOSIS — K219 Gastro-esophageal reflux disease without esophagitis: Secondary | ICD-10-CM | POA: Diagnosis not present

## 2012-12-09 DIAGNOSIS — R634 Abnormal weight loss: Secondary | ICD-10-CM | POA: Diagnosis not present

## 2012-12-09 NOTE — Progress Notes (Signed)
Presenting complaint;  Followup for IBS and GERD. History of weight loss.  Subjective:  Patient is 72 year old Caucasian female who is here for scheduled visit. She was last seen in March 2014. Last year she was evaluated for abdominal pain and weight loss and found to have retroperitoneal lesion and abnormal PET scan. This lesion was removed at laparotomy and turned out to be paraganglioma. She also has history of GERD and IBS. She states she is doing well. On most days she has 1-2 formed stools. One month ago she developed self-limiting bout of diarrhea and had single episode of hematochezia. Every now and then she has to take single pill of senna. Her appetite is fair. She denies heartburn nausea or vomiting but on most evenings she has chest pain which is relieved with anxiolytic and pain medication. She states she has no pain when she swims or does other exercises. She has no pain during the daytime. She is still having difficulty coping with having very little interaction with one of her daughters. She is scheduled to have abdominal CT scan of this year for followup of retroperitoneal paraganglioma. She has not lost any weight since her last visit. She is having to take one diclofenac daily for right knee pain. She is not having any side effects. On most days she takes one lansoprazole because of the cost. She feels she needs 2 doses on some days.  Current Medications: Current Outpatient Prescriptions  Medication Sig Dispense Refill  . ALPRAZolam (XANAX) 1 MG tablet Take 1 mg by mouth at bedtime as needed. HS as needed and can be repeated x 1 if needed. insomnia      . aspirin 81 MG tablet Take 81 mg by mouth. Patient states that she is taking this every other day      . Calcium Carbonate-Vit D-Min (CALTRATE 600+D PLUS MINERALS PO) Take 1 tablet by mouth 2 (two) times daily.       . diclofenac (VOLTAREN) 50 MG EC tablet Take 75 mg by mouth daily. Patient states that she is taking every  other day for now.      Marland Kitchen HYDROcodone-acetaminophen (NORCO) 10-325 MG per tablet Take 1 tablet by mouth every 6 (six) hours as needed. pain      . lansoprazole (PREVACID) 30 MG capsule TAKE 1 CAPSULE TWICE DAILY  60 capsule  5  . levothyroxine (SYNTHROID, LEVOTHROID) 112 MCG tablet Take 112 mcg by mouth daily before breakfast.       . lidocaine (LIDODERM) 5 % Place 1 patch onto the skin daily as needed. Remove & Discard patch within 12 hours or as directed by MD for pain      . Magnesium Hydroxide (MAGNESIA PO) Take by mouth.      . Multiple Vitamin (MULTIVITAMIN WITH MINERALS) TABS Take 1 tablet by mouth daily.      . Omega-3 Fatty Acids (FISH OIL) 1200 MG CAPS Take 1,200 mg by mouth daily.       . pravastatin (PRAVACHOL) 80 MG tablet Take 40 mg by mouth at bedtime.       . propranolol (INDERAL) 40 MG tablet Take 40 mg by mouth 2 (two) times daily.       . sennosides-docusate sodium (SENOKOT-S) 8.6-50 MG tablet Take 1 tablet by mouth daily as needed. constipation      . SUMAtriptan (IMITREX) 50 MG tablet Take 50 mg by mouth every 2 (two) hours as needed. For migraines      . triamcinolone ointment (  KENALOG) 0.5 % Apply 1 application topically 2 (two) times daily as needed. Rash      . pyridostigmine (MESTINON) 60 MG tablet Take 0.5 tablets (30 mg total) by mouth 3 (three) times daily.  45 tablet  6   No current facility-administered medications for this visit.    Objective: Blood pressure 118/70, pulse 70, temperature 98.6 F (37 C), temperature source Oral, resp. rate 18, height 5\' 4"  (1.626 m), weight 192 lb 8 oz (87.317 kg). Patient is alert and in no acute distress. Conjunctiva is pink. Sclera is nonicteric Oropharyngeal mucosa is normal. No neck masses or thyromegaly noted. Cardiac exam with regular rhythm normal S1 and S2. No murmur or gallop noted. Lungs are clear to auscultation. Abdomen symmetrical soft and nontender. No organomegaly or masses. No LE edema or clubbing  noted.   Assessment:  #1. Irritable bowel syndrome. She is doing well with High fiber diet. She is not taking dicyclomine. She is requiring senna occasionally for constipation. #2. GERD. She is currently on lansoprazole 30 mg twice a day takes less than that because of cost. #3. Recent hematochezia secondary to hemorrhoids. Doubt diverticular bleed. #4. History of retroperitoneal paraganglioma. Status post surgery in December 2013 and remains indeterminate. She is being followed at oncology clinic. #5. History of weight loss. Her weight has now leveled off. #6. Noncardiac chest pain most likely secondary to anxiety.    Plan:  Continue high fiber diet and lansoprazole as before. Office visit in 6 months.

## 2012-12-09 NOTE — Patient Instructions (Addendum)
Notify if Prevacid or lansoprazole stops working

## 2013-01-06 ENCOUNTER — Encounter (HOSPITAL_COMMUNITY): Payer: Medicare Other | Attending: Hematology and Oncology

## 2013-01-06 DIAGNOSIS — R300 Dysuria: Secondary | ICD-10-CM

## 2013-01-06 DIAGNOSIS — Z09 Encounter for follow-up examination after completed treatment for conditions other than malignant neoplasm: Secondary | ICD-10-CM | POA: Insufficient documentation

## 2013-01-06 DIAGNOSIS — D447 Neoplasm of uncertain behavior of aortic body and other paraganglia: Secondary | ICD-10-CM

## 2013-01-06 DIAGNOSIS — D483 Neoplasm of uncertain behavior of retroperitoneum: Secondary | ICD-10-CM

## 2013-01-06 DIAGNOSIS — Z23 Encounter for immunization: Secondary | ICD-10-CM | POA: Diagnosis not present

## 2013-01-06 LAB — LACTATE DEHYDROGENASE: LDH: 201 U/L (ref 94–250)

## 2013-01-06 LAB — CBC WITH DIFFERENTIAL/PLATELET
Eosinophils Relative: 3 % (ref 0–5)
HCT: 40.1 % (ref 36.0–46.0)
Hemoglobin: 13 g/dL (ref 12.0–15.0)
Lymphocytes Relative: 48 % — ABNORMAL HIGH (ref 12–46)
Lymphs Abs: 2.4 10*3/uL (ref 0.7–4.0)
MCV: 101.5 fL — ABNORMAL HIGH (ref 78.0–100.0)
Monocytes Absolute: 0.4 10*3/uL (ref 0.1–1.0)
Monocytes Relative: 8 % (ref 3–12)
Neutro Abs: 2.1 10*3/uL (ref 1.7–7.7)
RBC: 3.95 MIL/uL (ref 3.87–5.11)
RDW: 12.5 % (ref 11.5–15.5)
WBC: 5.1 10*3/uL (ref 4.0–10.5)

## 2013-01-06 LAB — COMPREHENSIVE METABOLIC PANEL
AST: 17 U/L (ref 0–37)
BUN: 20 mg/dL (ref 6–23)
CO2: 27 mEq/L (ref 19–32)
Calcium: 9.8 mg/dL (ref 8.4–10.5)
Chloride: 108 mEq/L (ref 96–112)
Creatinine, Ser: 1.15 mg/dL — ABNORMAL HIGH (ref 0.50–1.10)
GFR calc Af Amer: 53 mL/min — ABNORMAL LOW (ref >90)
GFR calc non Af Amer: 46 mL/min — ABNORMAL LOW (ref >90)
Glucose, Bld: 103 mg/dL — ABNORMAL HIGH (ref 70–99)
Total Bilirubin: 0.4 mg/dL (ref 0.3–1.2)

## 2013-01-06 NOTE — Progress Notes (Signed)
Labs drawn today for cbc/diff,cmp,ldh 

## 2013-01-13 ENCOUNTER — Ambulatory Visit (HOSPITAL_COMMUNITY)
Admission: RE | Admit: 2013-01-13 | Discharge: 2013-01-13 | Disposition: A | Discharge status: Home / Self Care | Payer: Medicare Other | Source: Ambulatory Visit | Attending: Oncology | Admitting: Oncology

## 2013-01-13 ENCOUNTER — Ambulatory Visit (HOSPITAL_COMMUNITY): Admission: RE | Admit: 2013-01-13 | Payer: Medicare Other | Source: Ambulatory Visit

## 2013-01-13 ENCOUNTER — Encounter (HOSPITAL_COMMUNITY): Payer: Self-pay

## 2013-01-13 ENCOUNTER — Ambulatory Visit (HOSPITAL_COMMUNITY): Payer: Medicare Other

## 2013-01-13 DIAGNOSIS — R300 Dysuria: Secondary | ICD-10-CM

## 2013-01-13 DIAGNOSIS — D483 Neoplasm of uncertain behavior of retroperitoneum: Secondary | ICD-10-CM | POA: Insufficient documentation

## 2013-01-13 DIAGNOSIS — D447 Neoplasm of uncertain behavior of aortic body and other paraganglia: Secondary | ICD-10-CM

## 2013-01-13 DIAGNOSIS — K5730 Diverticulosis of large intestine without perforation or abscess without bleeding: Secondary | ICD-10-CM | POA: Insufficient documentation

## 2013-01-13 DIAGNOSIS — N390 Urinary tract infection, site not specified: Secondary | ICD-10-CM | POA: Diagnosis not present

## 2013-01-13 DIAGNOSIS — R918 Other nonspecific abnormal finding of lung field: Secondary | ICD-10-CM | POA: Diagnosis not present

## 2013-01-13 DIAGNOSIS — Z09 Encounter for follow-up examination after completed treatment for conditions other than malignant neoplasm: Secondary | ICD-10-CM | POA: Insufficient documentation

## 2013-01-13 MED ORDER — IOHEXOL 300 MG/ML  SOLN
100.0000 mL | Freq: Once | INTRAMUSCULAR | Status: AC | PRN
  Administered 2013-01-13: 100 mL via INTRAVENOUS

## 2013-01-13 MED ORDER — SODIUM CHLORIDE 0.9 % IJ SOLN
INTRAMUSCULAR | Status: AC
  Filled 2013-01-13: qty 500

## 2013-01-14 ENCOUNTER — Encounter (HOSPITAL_BASED_OUTPATIENT_CLINIC_OR_DEPARTMENT_OTHER): Payer: Medicare Other

## 2013-01-14 VITALS — BP 131/57 | HR 44 | Temp 98.0°F | Resp 20 | Wt 195.9 lb

## 2013-01-14 DIAGNOSIS — M549 Dorsalgia, unspecified: Secondary | ICD-10-CM | POA: Diagnosis not present

## 2013-01-14 DIAGNOSIS — G7000 Myasthenia gravis without (acute) exacerbation: Secondary | ICD-10-CM

## 2013-01-14 DIAGNOSIS — D483 Neoplasm of uncertain behavior of retroperitoneum: Secondary | ICD-10-CM | POA: Diagnosis not present

## 2013-01-14 DIAGNOSIS — M545 Low back pain, unspecified: Secondary | ICD-10-CM | POA: Diagnosis not present

## 2013-01-14 DIAGNOSIS — M4150 Other secondary scoliosis, site unspecified: Secondary | ICD-10-CM | POA: Diagnosis not present

## 2013-01-14 DIAGNOSIS — R7301 Impaired fasting glucose: Secondary | ICD-10-CM | POA: Diagnosis not present

## 2013-01-14 DIAGNOSIS — E039 Hypothyroidism, unspecified: Secondary | ICD-10-CM | POA: Diagnosis not present

## 2013-01-14 NOTE — Patient Instructions (Signed)
.  [facility] Discharge Instructions  RECOMMENDATIONS MADE BY THE CONSULTANT AND ANY TEST RESULTS WILL BE SENT TO YOUR REFERRING PHYSICIAN.  EXAM FINDINGS BY THE PHYSICIAN TODAY AND SIGNS OR SYMPTOMS TO REPORT TO CLINIC OR PRIMARY PHYSICIAN: Exam and findings as discussed by Dr. Zigmund Daniel.   INSTRUCTIONS/FOLLOW-UP: Return in one year Call us a month before to set up your ct scans  Thank you for choosing [facility] to provide your oncology and hematology care.  To afford each patient quality time with our providers, please arrive at least 15 minutes before your scheduled appointment time.  With your help, our goal is to use those 15 minutes to complete the necessary work-up to ensure our physicians have the information they need to help with your evaluation and healthcare recommendations.    Effective January 1st, 2014, we ask that you re-schedule your appointment with our physicians should you arrive 10 or more minutes late for your appointment.  We strive to give you quality time with our providers, and arriving late affects you and other patients whose appointments are after yours.    Again, thank you for choosing [facility].  Our hope is that these requests will decrease the amount of time that you wait before being seen by our physicians.       _____________________________________________________________  Should you have questions after your visit to [facility], please contact our office at [phone] between the hours of 8:30 a.m. and 5:00 p.m.  Voicemails left after 4:30 p.m. will not be returned until the following business day.  For prescription refill requests, have your pharmacy contact our office with your prescription refill request.

## 2013-01-14 NOTE — Progress Notes (Signed)
[facility]  OFFICE PROGRESS NOTE  [place], [facility], MD  [address] [place] [address]  DIAGNOSIS: Neoplasm of uncertain behavior of retroperitoneum, paraganglioma - Plan: Lactate dehydrogenase, Comprehensive metabolic panel, Lactate dehydrogenase, CBC with Differential  Chief Complaint  Patient presents with  . paraganglioma (non functional)    CURRENT THERAPY: Watchful expectation and surveillance.  INTERVAL HISTORY: [name] 72 y.o. 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 She does have intermittent abdominal discomfort and back pain which is rather severe due to her previous job as a Administrator, arts. Her appetite has diminished to some degree. She does live alone and has had difficulty in sleeping using Xanax at bedtime. He denies any fever, night sweats, peripheral paresthesias, lower extremity swelling or redness, severe hot flashes, skin rash, headache, or seizures. She is recently seen bright red hematuria and is being treated for a urinary tract infection.  MEDICAL HISTORY: Past Medical History  Diagnosis Date  . Unspecified essential hypertension   . Mixed hyperlipidemia   . Obesity, unspecified   . Esophageal reflux   . Hypertension     over 10 yrs  . Hypothyroidism   . Migraines   . Osteoarthritis of knee     both knees  . IBS (irritable colon syndrome)   . Ocular myasthenia gravis   . Complication of anesthesia     Low blood pressure, heart rate, O2 sat  . Dysrhythmia     occ palpitations   . Heart murmur   . Anxiety   . Shortness of breath     with exertion   . Pneumonia     hx of   . Cancer     hx of skin cancer   . Anemia     INTERIM HISTORY: has Hypertension; Hypothyroidism; Arthritis; High cholesterol; GERD (gastroesophageal reflux disease); Rectal bleeding; IBS (irritable bowel  syndrome); Abdominal pain, bilateral lower quadrant; Neoplasm of uncertain behavior of retroperitoneum, paraganglioma; and Weight loss on her problem list.    ALLERGIES:  is allergic to macrodantin; clarithromycin; codeine; nitrofurantoin; tetracyclines & related; ampicillin; and biaxin.  MEDICATIONS: has a current medication list which includes the following prescription(s): alprazolam, aspirin, calcium carbonate-vit d-min, diclofenac, hydrocodone-acetaminophen, lansoprazole, levothyroxine, lidocaine, magnesium hydroxide, multivitamin with minerals, fish oil, pravastatin, propranolol, pyridostigmine, sennosides-docusate sodium, sumatriptan, and triamcinolone ointment.  SURGICAL HISTORY:  Past Surgical History  Procedure Laterality Date  . Knee surgery    . Total knee arthroplasty      both knee. 2005 1st, 2011 the last one  . Abdominal hysterectomy    . Cholecystectomy    . Nasal sinus surgery      30 yrs ago  . Colonoscopy  08/24/2011    Procedure: COLONOSCOPY;  Surgeon: Malissa Hippo, MD;  Location: [facility];  Service: Endoscopy;  Laterality: N/A;  1200  . Ct guided biopsy  01/16/12  . Givens capsule study  01/29/2012    Procedure: GIVENS CAPSULE STUDY;  Surgeon: Malissa Hippo, MD;  Location: [facility];  Service: Endoscopy;  Laterality: N/A;  730  . Cardiac catheterization    . Laparotomy  03/01/2012    Procedure: EXPLORATORY LAPAROTOMY;  Surgeon: Velora Heckler, MD;  Location: [facility] ORS;  Service: General;  Laterality: N/A;  Exploratory Laparotomy ,Resection Retroperitoneal Mass    FAMILY HISTORY: family history includes Inflammatory  bowel disease in her maternal grandmother.  SOCIAL HISTORY:  reports that she has never smoked. She has never used smokeless tobacco. She reports that she does not drink alcohol or use illicit drugs.  REVIEW OF SYSTEMS:  Other than that discussed above is noncontributory.  PHYSICAL EXAMINATION: ECOG PERFORMANCE STATUS: 1 - Symptomatic but  completely ambulatory  There were no vitals taken for this visit.  GENERAL:alert, no distress and comfortable SKIN: skin color, texture, turgor are normal, no rashes or significant lesions EYES: PERLA; Conjunctiva are pink and non-injected, sclera clear OROPHARYNX:no exudate, no erythema on lips, buccal mucosa, or tongue. NECK: supple, thyroid normal size, non-tender, without nodularity. No masses CHEST: Normal AP diameter with no breast masses. LYMPH:  no palpable lymphadenopathy in the cervical, axillary or inguinal LUNGS: clear to auscultation and percussion with normal breathing effort HEART: regular rate & rhythm and no murmurs and no lower extremity edema ABDOMEN:abdomen soft, non-tender and normal bowel sounds MUSCULOSKELETAL:no cyanosis of digits and no clubbing. Range of motion normal.  NEURO: alert & oriented x 3 with fluent speech, no focal motor/sensory deficits   LABORATORY DATA: Infusion on 01/06/2013  Component Date Value Range Status  . LDH 01/06/2013 201  94 - 250 U/L Final  . WBC 01/06/2013 5.1  4.0 - 10.5 K/uL Final  . RBC 01/06/2013 3.95  3.87 - 5.11 MIL/uL Final  . Hemoglobin 01/06/2013 13.0  12.0 - 15.0 g/dL Final  . HCT 40/98/1191 40.1  36.0 - 46.0 % Final  . MCV 01/06/2013 101.5* 78.0 - 100.0 fL Final  . MCH 01/06/2013 32.9  26.0 - 34.0 pg Final  . MCHC 01/06/2013 32.4  30.0 - 36.0 g/dL Final  . RDW 47/82/9562 12.5  11.5 - 15.5 % Final  . Platelets 01/06/2013 192  150 - 400 K/uL Final  . Neutrophils Relative % 01/06/2013 41* 43 - 77 % Final  . Neutro Abs 01/06/2013 2.1  1.7 - 7.7 K/uL Final  . Lymphocytes Relative 01/06/2013 48* 12 - 46 % Final  . Lymphs Abs 01/06/2013 2.4  0.7 - 4.0 K/uL Final  . Monocytes Relative 01/06/2013 8  3 - 12 % Final  . Monocytes Absolute 01/06/2013 0.4  0.1 - 1.0 K/uL Final  . Eosinophils Relative 01/06/2013 3  0 - 5 % Final  . Eosinophils Absolute 01/06/2013 0.2  0.0 - 0.7 K/uL Final  . Basophils Relative 01/06/2013 1  0 - 1  % Final  . Basophils Absolute 01/06/2013 0.0  0.0 - 0.1 K/uL Final  . Sodium 01/06/2013 142  135 - 145 mEq/L Final  . Potassium 01/06/2013 4.1  3.5 - 5.1 mEq/L Final  . Chloride 01/06/2013 108  96 - 112 mEq/L Final  . CO2 01/06/2013 27  19 - 32 mEq/L Final  . Glucose, Bld 01/06/2013 103* 70 - 99 mg/dL Final  . BUN 13/10/6576 20  6 - 23 mg/dL Final  . Creatinine, Ser 01/06/2013 1.15* 0.50 - 1.10 mg/dL Final  . Calcium 46/96/2952 9.8  8.4 - 10.5 mg/dL Final  . Total Protein 01/06/2013 7.0  6.0 - 8.3 g/dL Final  . Albumin 84/13/2440 3.5  3.5 - 5.2 g/dL Final  . AST 01/14/2535 17  0 - 37 U/L Final  . ALT 01/06/2013 11  0 - 35 U/L Final  . Alkaline Phosphatase 01/06/2013 54  39 - 117 U/L Final  . Total Bilirubin 01/06/2013 0.4  0.3 - 1.2 mg/dL Final  . GFR calc non Af Amer 01/06/2013 46* >90 mL/min Final  .  GFR calc Af Amer 01/06/2013 53* >90 mL/min Final   Comment: (NOTE)                          The eGFR has been calculated using the CKD EPI equation.                          This calculation has not been validated in all clinical situations.                          eGFR's persistently <90 mL/min signify possible Chronic Kidney                          Disease.    PATHOLOGY: 3805) Patient Name: [name], [name] Accession #: [id number] DOB: May 27, 1940 Age: 72 Gender: F Client Name [facility] Collected Date: 03/01/2012 Received Date: 03/01/2012 Physician: Darnell Level Chart #: MRN # : [id number] Physician cc: Race:W Visit #: [id number] REPORT OF SURGICAL PATHOLOGY FINAL DIAGNOSIS Diagnosis Retroperitoneal mass, resection for tumor, left paraaortic mass - PARAGANGLIOMA WITH ASSOCIATED FIBROSIS, 2.5 CM. - RESECTION MARGIN, NEGATIVE FOR NEOPLASM. PLEASE SEE COMMENT. Microscopic Comment There are nests and clusters of neoplastic cells with abundant cytoplasm and inconspicuous nuclei. There is no evidence of significant cytologic atypia, mitotic activity, or necrosis identified.  Given the radiographic findings, the overall histologic features are diagnostic for a paraganglioma. The resection margins are not involved by tumor. Dr. Laureen Ochs agrees. Please correlate with previous case [id number]. (HCL:eps 03/04/12) Gwendolyn Grant LI MD Pathologist, Electronic Signature (Case signed 03/04/2012) Specimen Gross and Clinical Information Specimen(s) Obtained: Retroperitoneal mass, resection for tumor, left paraaortic mass Specimen Clinical Information neuroendocrine tumor of retroperitoneum (je) Gross Received fresh is a 2.5 x 1.7 x 1.5 cm rubbery ovoid nodule with a small amount of fat attached to the external surface. The margins are inked prior to sectioning. The cut surface is solid, mottled tan pink to hemorrhagic. There is a separate 1.2 cm portion of fat. Touch preparations are made from the cut surface and a portion of the specimen is placed in RPMI for flow cytometry. Sections are submitted in three cassettes. (GRP:kh 03-01-12) Report signed out from the following location(s) [facility] [address], [place], [place] [address]. Urinalysis    Component Value Date/Time   COLORURINE YELLOW 04/08/2012 1505   APPEARANCEUR CLEAR 04/08/2012 1505   LABSPEC <1.005* 04/08/2012 1505   PHURINE 6.0 04/08/2012 1505   GLUCOSEU NEGATIVE 04/08/2012 1505   HGBUR TRACE* 04/08/2012 1505   BILIRUBINUR NEGATIVE 04/08/2012 1505   KETONESUR NEGATIVE 04/08/2012 1505   PROTEINUR NEGATIVE 04/08/2012 1505   UROBILINOGEN 0.2 04/08/2012 1505   NITRITE NEGATIVE 04/08/2012 1505   LEUKOCYTESUR NEGATIVE 04/08/2012 1505    RADIOGRAPHIC STUDIES: Ct Chest W Contrast  01/13/2013   CLINICAL DATA:  Followup abdominal retroperitoneal paraganglioma.  EXAM: CT CHEST, ABDOMEN, AND PELVIS WITH CONTRAST  TECHNIQUE: Multidetector CT imaging of the chest, abdomen and pelvis was performed following the standard protocol during bolus administration of intravenous contrast.  CONTRAST:   OMNIPAQUE IOHEXOL 300 MG/ML  SOLN  COMPARISON:  PET-CT on 01/10/2012  FINDINGS: CT CHEST FINDINGS  No evidence of mediastinal or hilar masses. No lymphadenopathy seen within the thorax. No evidence of pleural or pericardial effusion.  Right lower lobe scarring again noted, however no suspicious pulmonary nodules or masses are identified.  No evidence of pulmonary infiltrate or central endobronchial lesion. No evidence of chest wall mass or suspicious bone lesions.  CT ABDOMEN AND PELVIS FINDINGS  Previously seen left paraaortic retroperitoneal nodule is no longer visualized. No retroperitoneal masses or lymphadenopathy seen elsewhere within the abdomen or pelvis. No other sites of lymphadenopathy identified.  Surgical clips again seen from prior cholecystectomy. The liver, pancreas, spleen, adrenal glands, and kidneys are unremarkable in appearance. No evidence of hydronephrosis.  Severe diverticulosis is again seen involving the left colon, however there is no evidence of diverticulitis. No other inflammatory process or abnormal fluid collections are identified. Prior hysterectomy noted. Adnexal regions are unremarkable. No suspicious bone lesions identified.  IMPRESSION: No evidence of recurrent masses or lymphadenopathy within the chest, abdomen, or pelvis.  Severe diverticulosis. No radiographic evidence of diverticulitis.   Electronically Signed   By: Myles Rosenthal M.D.   On: 01/13/2013 15:19   Ct Abdomen Pelvis W Contrast  01/13/2013   CLINICAL DATA:  Followup abdominal retroperitoneal paraganglioma.  EXAM: CT CHEST, ABDOMEN, AND PELVIS WITH CONTRAST  TECHNIQUE: Multidetector CT imaging of the chest, abdomen and pelvis was performed following the standard protocol during bolus administration of intravenous contrast.  CONTRAST:  OMNIPAQUE IOHEXOL 300 MG/ML  SOLN  COMPARISON:  PET-CT on 01/10/2012  FINDINGS: CT CHEST FINDINGS  No evidence of mediastinal or hilar masses. No lymphadenopathy seen within the  thorax. No evidence of pleural or pericardial effusion.  Right lower lobe scarring again noted, however no suspicious pulmonary nodules or masses are identified. No evidence of pulmonary infiltrate or central endobronchial lesion. No evidence of chest wall mass or suspicious bone lesions.  CT ABDOMEN AND PELVIS FINDINGS  Previously seen left paraaortic retroperitoneal nodule is no longer visualized. No retroperitoneal masses or lymphadenopathy seen elsewhere within the abdomen or pelvis. No other sites of lymphadenopathy identified.  Surgical clips again seen from prior cholecystectomy. The liver, pancreas, spleen, adrenal glands, and kidneys are unremarkable in appearance. No evidence of hydronephrosis.  Severe diverticulosis is again seen involving the left colon, however there is no evidence of divert;iculitis. No other inflammatory process or abnormal fluid collections are identified. Prior hysterectomy noted. Adnexal regions are unremarkable. No suspicious bone lesions identified.  IMPRESSION: No evidence of recurrent masses or lymphadenopathy within the chest, abdomen, or pelvis.  Severe diverticulosis. No radiographic evidence of diverticulitis.   Electronically Signed   By: Myles Rosenthal M.D.   On: 01/13/2013 15:19    ASSESSMENT:  #1. Paraganglioma the retroperitoneum, status post resection, no evidence of disease. #2. Probable irritable bowel syndrome. #3. Ocular myasthenia gravis. #4. Hypothyroidism, on Synthroid replacement. #5. Scoliosis with chronic back pain. #6. Possible depression, reactive according to the patient's current social situation.  #7. Hematuria, currently being treated with antibiotics.    PLAN:  #1. Continue watchful expectation. #2. Patient does not wish to try any medication for depression. #3. Followup in one year with CT scan done just prior to that visit. #4. Patient was told to call should she develop any new signs or symptoms. #5. Suggested urological followup  for hematuria.   All questions were answered. The patient knows to call the clinic with any problems, questions or concerns. We can certainly see the patient much sooner if necessary.   I spent 25 minutes counseling the patient face to face. The total time spent in the appointment was 30 minutes.    Maurilio Lovely, MD 01/14/2013 1:58 PM

## 2013-01-15 ENCOUNTER — Other Ambulatory Visit (HOSPITAL_COMMUNITY): Payer: Medicare Other

## 2013-01-23 ENCOUNTER — Other Ambulatory Visit: Payer: Self-pay

## 2013-01-28 DIAGNOSIS — N905 Atrophy of vulva: Secondary | ICD-10-CM | POA: Diagnosis not present

## 2013-01-28 DIAGNOSIS — R319 Hematuria, unspecified: Secondary | ICD-10-CM | POA: Diagnosis not present

## 2013-01-28 DIAGNOSIS — G7000 Myasthenia gravis without (acute) exacerbation: Secondary | ICD-10-CM | POA: Diagnosis not present

## 2013-02-18 DIAGNOSIS — G7000 Myasthenia gravis without (acute) exacerbation: Secondary | ICD-10-CM | POA: Diagnosis not present

## 2013-02-18 DIAGNOSIS — N905 Atrophy of vulva: Secondary | ICD-10-CM | POA: Diagnosis not present

## 2013-02-18 DIAGNOSIS — R319 Hematuria, unspecified: Secondary | ICD-10-CM | POA: Diagnosis not present

## 2013-03-06 DIAGNOSIS — K589 Irritable bowel syndrome without diarrhea: Secondary | ICD-10-CM | POA: Diagnosis not present

## 2013-03-06 DIAGNOSIS — Z885 Allergy status to narcotic agent status: Secondary | ICD-10-CM | POA: Diagnosis not present

## 2013-03-06 DIAGNOSIS — M129 Arthropathy, unspecified: Secondary | ICD-10-CM | POA: Diagnosis not present

## 2013-03-06 DIAGNOSIS — Z7982 Long term (current) use of aspirin: Secondary | ICD-10-CM | POA: Diagnosis not present

## 2013-03-06 DIAGNOSIS — Z888 Allergy status to other drugs, medicaments and biological substances status: Secondary | ICD-10-CM | POA: Diagnosis not present

## 2013-03-06 DIAGNOSIS — E7800 Pure hypercholesterolemia, unspecified: Secondary | ICD-10-CM | POA: Diagnosis not present

## 2013-03-06 DIAGNOSIS — IMO0001 Reserved for inherently not codable concepts without codable children: Secondary | ICD-10-CM | POA: Diagnosis not present

## 2013-03-06 DIAGNOSIS — F329 Major depressive disorder, single episode, unspecified: Secondary | ICD-10-CM | POA: Diagnosis not present

## 2013-03-06 DIAGNOSIS — I10 Essential (primary) hypertension: Secondary | ICD-10-CM | POA: Diagnosis not present

## 2013-03-06 DIAGNOSIS — N3010 Interstitial cystitis (chronic) without hematuria: Secondary | ICD-10-CM | POA: Diagnosis not present

## 2013-03-06 DIAGNOSIS — Z79899 Other long term (current) drug therapy: Secondary | ICD-10-CM | POA: Diagnosis not present

## 2013-03-06 DIAGNOSIS — Z883 Allergy status to other anti-infective agents status: Secondary | ICD-10-CM | POA: Diagnosis not present

## 2013-03-06 DIAGNOSIS — E039 Hypothyroidism, unspecified: Secondary | ICD-10-CM | POA: Diagnosis not present

## 2013-03-06 DIAGNOSIS — IMO0002 Reserved for concepts with insufficient information to code with codable children: Secondary | ICD-10-CM | POA: Diagnosis not present

## 2013-03-06 DIAGNOSIS — G7000 Myasthenia gravis without (acute) exacerbation: Secondary | ICD-10-CM | POA: Diagnosis not present

## 2013-03-06 DIAGNOSIS — G43909 Migraine, unspecified, not intractable, without status migrainosus: Secondary | ICD-10-CM | POA: Diagnosis not present

## 2013-03-06 DIAGNOSIS — N905 Atrophy of vulva: Secondary | ICD-10-CM | POA: Diagnosis not present

## 2013-03-06 DIAGNOSIS — K219 Gastro-esophageal reflux disease without esophagitis: Secondary | ICD-10-CM | POA: Diagnosis not present

## 2013-03-06 DIAGNOSIS — R319 Hematuria, unspecified: Secondary | ICD-10-CM | POA: Diagnosis not present

## 2013-03-06 DIAGNOSIS — F411 Generalized anxiety disorder: Secondary | ICD-10-CM | POA: Diagnosis not present

## 2013-03-19 DIAGNOSIS — R319 Hematuria, unspecified: Secondary | ICD-10-CM | POA: Diagnosis not present

## 2013-03-25 DIAGNOSIS — R319 Hematuria, unspecified: Secondary | ICD-10-CM | POA: Diagnosis not present

## 2013-03-25 DIAGNOSIS — N3090 Cystitis, unspecified without hematuria: Secondary | ICD-10-CM | POA: Diagnosis not present

## 2013-03-25 DIAGNOSIS — G7000 Myasthenia gravis without (acute) exacerbation: Secondary | ICD-10-CM | POA: Diagnosis not present

## 2013-03-25 DIAGNOSIS — N905 Atrophy of vulva: Secondary | ICD-10-CM | POA: Diagnosis not present

## 2013-04-01 DIAGNOSIS — R7301 Impaired fasting glucose: Secondary | ICD-10-CM | POA: Diagnosis not present

## 2013-04-01 DIAGNOSIS — E782 Mixed hyperlipidemia: Secondary | ICD-10-CM | POA: Diagnosis not present

## 2013-04-01 DIAGNOSIS — E039 Hypothyroidism, unspecified: Secondary | ICD-10-CM | POA: Diagnosis not present

## 2013-04-01 DIAGNOSIS — R030 Elevated blood-pressure reading, without diagnosis of hypertension: Secondary | ICD-10-CM | POA: Diagnosis not present

## 2013-04-03 DIAGNOSIS — E039 Hypothyroidism, unspecified: Secondary | ICD-10-CM | POA: Diagnosis not present

## 2013-04-03 DIAGNOSIS — K219 Gastro-esophageal reflux disease without esophagitis: Secondary | ICD-10-CM | POA: Diagnosis not present

## 2013-04-03 DIAGNOSIS — N3090 Cystitis, unspecified without hematuria: Secondary | ICD-10-CM | POA: Diagnosis not present

## 2013-04-03 DIAGNOSIS — E782 Mixed hyperlipidemia: Secondary | ICD-10-CM | POA: Diagnosis not present

## 2013-04-04 DIAGNOSIS — K219 Gastro-esophageal reflux disease without esophagitis: Secondary | ICD-10-CM | POA: Diagnosis not present

## 2013-04-04 DIAGNOSIS — E782 Mixed hyperlipidemia: Secondary | ICD-10-CM | POA: Diagnosis not present

## 2013-04-04 DIAGNOSIS — E039 Hypothyroidism, unspecified: Secondary | ICD-10-CM | POA: Diagnosis not present

## 2013-04-04 DIAGNOSIS — N3090 Cystitis, unspecified without hematuria: Secondary | ICD-10-CM | POA: Diagnosis not present

## 2013-06-10 ENCOUNTER — Encounter (INDEPENDENT_AMBULATORY_CARE_PROVIDER_SITE_OTHER): Payer: Self-pay | Admitting: Internal Medicine

## 2013-06-10 ENCOUNTER — Ambulatory Visit (INDEPENDENT_AMBULATORY_CARE_PROVIDER_SITE_OTHER): Payer: Medicare Other | Admitting: Internal Medicine

## 2013-06-10 VITALS — BP 110/74 | HR 68 | Temp 98.3°F | Resp 18 | Ht 64.0 in | Wt 191.8 lb

## 2013-06-10 DIAGNOSIS — R634 Abnormal weight loss: Secondary | ICD-10-CM

## 2013-06-10 DIAGNOSIS — K219 Gastro-esophageal reflux disease without esophagitis: Secondary | ICD-10-CM | POA: Diagnosis not present

## 2013-06-10 DIAGNOSIS — K589 Irritable bowel syndrome without diarrhea: Secondary | ICD-10-CM

## 2013-06-10 NOTE — Patient Instructions (Signed)
Notify if lansoprazole quits working or you start losing weight again.

## 2013-06-10 NOTE — Progress Notes (Signed)
Presenting complaint;  Follow for weight loss GERD and IBS.  Subjective:  Patient is 73 year old Caucasian female was at her scheduled visit. She was last seen 6 months ago. She has not lost any weight since her last visit. She states her appetite is not good but she eats 3 meals a day. Heartburn is well controlled. She feels she needs lansoprazole twice a day but because of cost reasons she does not always take twice a day. She is having regular bowel movements now. She does not have urgency or abdominal pain. She is not taking dicyclomine. She denies melena or rectal bleeding. She remains that back pain as well as pain in her knee joints. She takes one to 2 Norco tablets a day. In January she experienced gross immature he had underwent cystoscopy and found to have cystitis. Hematuria has resolved. She was evaluated at [facility].  Current Medications: Outpatient Encounter Prescriptions as of 06/10/2013  Medication Sig  . ALPRAZolam (XANAX) 1 MG tablet Take 1 mg by mouth at bedtime as needed. HS as needed and can be repeated x 1 if needed. insomnia  . aspirin 81 MG tablet Take 81 mg by mouth daily. Patient states that she is taking this every other day  . Calcium Carbonate-Vit D-Min (CALTRATE 600+D PLUS MINERALS PO) Take 1 tablet by mouth 2 (two) times daily.   . diclofenac (VOLTAREN) 50 MG EC tablet Take 75 mg by mouth daily.   Marland Kitchen HYDROcodone-acetaminophen (NORCO) 10-325 MG per tablet Take 1 tablet by mouth every 6 (six) hours as needed. pain  . lansoprazole (PREVACID) 30 MG capsule TAKE 1 CAPSULE TWICE DAILY  . levothyroxine (SYNTHROID, LEVOTHROID) 112 MCG tablet Take 112 mcg by mouth daily before breakfast.   . lidocaine (LIDODERM) 5 % Place 1 patch onto the skin daily as needed. Remove & Discard patch within 12 hours or as directed by MD for pain  . Magnesium Hydroxide (MAGNESIA PO) Take 250 mg by mouth 2 (two) times daily.   . Multiple Vitamin (MULTIVITAMIN WITH MINERALS) TABS Take 1 tablet by mouth  daily.  . Omega-3 Fatty Acids (FISH OIL) 1200 MG CAPS Take 1,200 mg by mouth daily.   . pravastatin (PRAVACHOL) 80 MG tablet Take 40 mg by mouth at bedtime.   . propranolol (INDERAL) 40 MG tablet Take 40 mg by mouth 2 (two) times daily.   Marland Kitchen pyridostigmine (MESTINON) 60 MG tablet Take 0.5 tablets (30 mg total) by mouth 3 (three) times daily.  . SUMAtriptan (IMITREX) 50 MG tablet Take 50 mg by mouth every 2 (two) hours as needed. For migraines  . triamcinolone ointment (KENALOG) 0.5 % Apply 1 application topically 2 (two) times daily as needed. Rash  . [DISCONTINUED] ciprofloxacin (CIPRO) 500 MG tablet Take 500 mg by mouth 2 (two) times daily. X 7 days  . [DISCONTINUED] sennosides-docusate sodium (SENOKOT-S) 8.6-50 MG tablet Take 1 tablet by mouth daily as needed. constipation     Objective: Blood pressure 110/74, pulse 68, temperature 98.3 F (36.8 C), temperature source Oral, resp. rate 18, height 5\' 4"  (1.626 m), weight 191 lb 12.8 oz (87 kg). Patient is alert and in no acute distress. Conjunctiva is pink. Sclera is nonicteric Oropharyngeal mucosa is normal. No neck masses or thyromegaly noted. Abdomen. His full. Bowel sounds are normal. On palpation abdomen is soft without hepatosplenomegaly or masses.  No LE edema or clubbing noted.   Assessment:  #1. Weight loss. Weight loss has stopped. Not sure as to the etiology. #2. GERD. She  is doing well with therapy. #3. Irritable bowel syndrome. She is not having any symptoms. Last colonoscopy was in June 2013 with removal of 2 small polyps which are non-adenomatous.   Plan:  Continue lansoprazole as before. Patient to call if weight drops by more than 5 pounds otherwise return for office visit in one year.

## 2013-07-13 ENCOUNTER — Other Ambulatory Visit: Payer: Self-pay | Admitting: Neurology

## 2013-07-28 ENCOUNTER — Other Ambulatory Visit: Payer: Self-pay | Admitting: Neurology

## 2013-07-29 DIAGNOSIS — N3090 Cystitis, unspecified without hematuria: Secondary | ICD-10-CM | POA: Diagnosis not present

## 2013-07-29 DIAGNOSIS — G7000 Myasthenia gravis without (acute) exacerbation: Secondary | ICD-10-CM | POA: Diagnosis not present

## 2013-07-29 DIAGNOSIS — N905 Atrophy of vulva: Secondary | ICD-10-CM | POA: Diagnosis not present

## 2013-07-29 DIAGNOSIS — R319 Hematuria, unspecified: Secondary | ICD-10-CM | POA: Diagnosis not present

## 2013-07-30 DIAGNOSIS — R319 Hematuria, unspecified: Secondary | ICD-10-CM | POA: Diagnosis not present

## 2013-07-31 DIAGNOSIS — E039 Hypothyroidism, unspecified: Secondary | ICD-10-CM | POA: Diagnosis not present

## 2013-07-31 DIAGNOSIS — R030 Elevated blood-pressure reading, without diagnosis of hypertension: Secondary | ICD-10-CM | POA: Diagnosis not present

## 2013-07-31 DIAGNOSIS — E782 Mixed hyperlipidemia: Secondary | ICD-10-CM | POA: Diagnosis not present

## 2013-08-07 DIAGNOSIS — E782 Mixed hyperlipidemia: Secondary | ICD-10-CM | POA: Diagnosis not present

## 2013-08-07 DIAGNOSIS — E039 Hypothyroidism, unspecified: Secondary | ICD-10-CM | POA: Diagnosis not present

## 2013-08-07 DIAGNOSIS — G4700 Insomnia, unspecified: Secondary | ICD-10-CM | POA: Diagnosis not present

## 2013-08-07 DIAGNOSIS — K219 Gastro-esophageal reflux disease without esophagitis: Secondary | ICD-10-CM | POA: Diagnosis not present

## 2013-08-11 ENCOUNTER — Other Ambulatory Visit (INDEPENDENT_AMBULATORY_CARE_PROVIDER_SITE_OTHER): Payer: Self-pay | Admitting: Internal Medicine

## 2013-08-12 ENCOUNTER — Other Ambulatory Visit (INDEPENDENT_AMBULATORY_CARE_PROVIDER_SITE_OTHER): Payer: Self-pay | Admitting: Internal Medicine

## 2013-08-12 DIAGNOSIS — K219 Gastro-esophageal reflux disease without esophagitis: Secondary | ICD-10-CM

## 2013-08-12 MED ORDER — LANSOPRAZOLE 30 MG PO CPDR: DELAYED_RELEASE_CAPSULE | ORAL | Status: DC

## 2013-08-14 ENCOUNTER — Other Ambulatory Visit: Payer: Self-pay | Admitting: Neurology

## 2013-08-14 NOTE — Telephone Encounter (Signed)
Rx was already sent on 05/11.  I called the pharmacy who says they did get the Rx on 05/11.

## 2013-08-26 DIAGNOSIS — H25019 Cortical age-related cataract, unspecified eye: Secondary | ICD-10-CM | POA: Diagnosis not present

## 2013-08-26 DIAGNOSIS — H532 Diplopia: Secondary | ICD-10-CM | POA: Diagnosis not present

## 2013-08-26 DIAGNOSIS — H5000 Unspecified esotropia: Secondary | ICD-10-CM | POA: Diagnosis not present

## 2013-08-28 ENCOUNTER — Encounter: Payer: Self-pay | Admitting: Cardiology

## 2013-08-28 ENCOUNTER — Encounter: Payer: Self-pay | Admitting: Cardiovascular Disease

## 2013-08-28 DIAGNOSIS — G4700 Insomnia, unspecified: Secondary | ICD-10-CM | POA: Diagnosis not present

## 2013-08-28 DIAGNOSIS — R944 Abnormal results of kidney function studies: Secondary | ICD-10-CM | POA: Diagnosis not present

## 2013-09-25 ENCOUNTER — Other Ambulatory Visit (HOSPITAL_COMMUNITY): Payer: Self-pay | Admitting: Internal Medicine

## 2013-09-25 DIAGNOSIS — Z1231 Encounter for screening mammogram for malignant neoplasm of breast: Secondary | ICD-10-CM

## 2013-09-29 ENCOUNTER — Ambulatory Visit (HOSPITAL_COMMUNITY)
Admission: RE | Admit: 2013-09-29 | Discharge: 2013-09-29 | Disposition: A | Discharge status: Home / Self Care | Payer: Medicare Other | Source: Ambulatory Visit | Attending: Internal Medicine | Admitting: Internal Medicine

## 2013-09-29 DIAGNOSIS — Z1231 Encounter for screening mammogram for malignant neoplasm of breast: Secondary | ICD-10-CM | POA: Diagnosis not present

## 2013-11-05 DIAGNOSIS — B351 Tinea unguium: Secondary | ICD-10-CM | POA: Diagnosis not present

## 2013-11-05 DIAGNOSIS — L0100 Impetigo, unspecified: Secondary | ICD-10-CM | POA: Diagnosis not present

## 2013-11-05 DIAGNOSIS — L570 Actinic keratosis: Secondary | ICD-10-CM | POA: Diagnosis not present

## 2013-11-05 DIAGNOSIS — C44611 Basal cell carcinoma of skin of unspecified upper limb, including shoulder: Secondary | ICD-10-CM | POA: Diagnosis not present

## 2013-11-05 DIAGNOSIS — D485 Neoplasm of uncertain behavior of skin: Secondary | ICD-10-CM | POA: Diagnosis not present

## 2013-11-27 DIAGNOSIS — C44611 Basal cell carcinoma of skin of unspecified upper limb, including shoulder: Secondary | ICD-10-CM | POA: Diagnosis not present

## 2013-12-16 DIAGNOSIS — Z23 Encounter for immunization: Secondary | ICD-10-CM | POA: Diagnosis not present

## 2013-12-19 ENCOUNTER — Other Ambulatory Visit (HOSPITAL_COMMUNITY): Payer: Self-pay | Admitting: Internal Medicine

## 2013-12-19 ENCOUNTER — Ambulatory Visit (HOSPITAL_COMMUNITY)
Admission: RE | Admit: 2013-12-19 | Discharge: 2013-12-19 | Disposition: A | Discharge status: Home / Self Care | Payer: Medicare Other | Source: Ambulatory Visit | Attending: Internal Medicine | Admitting: Internal Medicine

## 2013-12-19 DIAGNOSIS — R0602 Shortness of breath: Secondary | ICD-10-CM | POA: Diagnosis not present

## 2013-12-19 DIAGNOSIS — W19XXXA Unspecified fall, initial encounter: Secondary | ICD-10-CM | POA: Diagnosis not present

## 2013-12-19 DIAGNOSIS — W19XXXD Unspecified fall, subsequent encounter: Secondary | ICD-10-CM

## 2013-12-19 DIAGNOSIS — R0781 Pleurodynia: Secondary | ICD-10-CM | POA: Diagnosis not present

## 2014-01-02 ENCOUNTER — Other Ambulatory Visit: Payer: Self-pay

## 2014-01-09 DIAGNOSIS — R079 Chest pain, unspecified: Secondary | ICD-10-CM | POA: Diagnosis not present

## 2014-01-12 ENCOUNTER — Other Ambulatory Visit (INDEPENDENT_AMBULATORY_CARE_PROVIDER_SITE_OTHER): Payer: Self-pay | Admitting: Internal Medicine

## 2014-01-13 ENCOUNTER — Encounter: Payer: Self-pay | Admitting: Cardiovascular Disease

## 2014-01-13 ENCOUNTER — Ambulatory Visit (INDEPENDENT_AMBULATORY_CARE_PROVIDER_SITE_OTHER): Payer: Medicare Other | Admitting: Cardiovascular Disease

## 2014-01-13 ENCOUNTER — Ambulatory Visit (HOSPITAL_COMMUNITY): Payer: Medicare Other

## 2014-01-13 VITALS — BP 128/81 | HR 51 | Ht 64.0 in | Wt 197.0 lb

## 2014-01-13 DIAGNOSIS — K219 Gastro-esophageal reflux disease without esophagitis: Secondary | ICD-10-CM | POA: Diagnosis not present

## 2014-01-13 DIAGNOSIS — R079 Chest pain, unspecified: Secondary | ICD-10-CM

## 2014-01-13 DIAGNOSIS — Z9181 History of falling: Secondary | ICD-10-CM

## 2014-01-13 DIAGNOSIS — F419 Anxiety disorder, unspecified: Secondary | ICD-10-CM | POA: Diagnosis not present

## 2014-01-13 DIAGNOSIS — I10 Essential (primary) hypertension: Secondary | ICD-10-CM | POA: Diagnosis not present

## 2014-01-13 DIAGNOSIS — E785 Hyperlipidemia, unspecified: Secondary | ICD-10-CM

## 2014-01-13 NOTE — Patient Instructions (Signed)
Continue all current medications. Follow up in  3-4 months.  

## 2014-01-13 NOTE — Progress Notes (Signed)
Patient ID: ITZAYANNA KASTER, female   DOB: Mar 15, 1941, 73 y.o.   MRN: [id number]       CARDIOLOGY CONSULT NOTE  Patient ID: SHANIK BROOKSHIRE MRN: [id number] DOB/AGE: [birth year] 73 y.o.  Admit date: (Not on file) Primary Physician Delphina Cahill, MD  Reason for Consultation: chest pain  HPI: The patient is a 73 year old woman with a history of hypertension and hyperlipidemia who has been referred for evaluation of chest pain by Dr. Nevada Crane. She tells me she had a cardiac catheterization approximately 15 years ago and had a "33% blockage in a small artery". She normally exercises by using a bicycle and swimming three times per week with no exertional chest pain or shortness of breath. She has had retrosternal chest pain only at night when she becomes more anxious for the past 2 years. She has a daughter with bipolar disorder and said that when she goes to bed, she begins to think about her interpersonal issues more, becomes anxious, and then experiences chest pain. 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 Within the past few weeks, she was walking up the stairs at her bank and fell and sustained trauma to her abdomen, rib cage, and back. She had chest x-rays which did not reveal any fractures. Ever since that fall, she had retrosternal chest pain which radiated to her back and caused a stabbing pain. It was constant and not made worse with exertion but made worse with changes in position and lifting her 23 pound granddaughter. She began taking her proton pump inhibitor twice daily and allowed her body to rest and she no longer has any chest pain since this past Friday.  ECG performed in the office today demonstrates sinus bradycardia, heart rate 54 bpm, with a left anterior fascicular block.  Allergies  Allergen Reactions  . Macrodantin [Nitrofurantoin Macrocrystal] Nausea And Vomiting  . Clarithromycin   . Codeine    Can not take Codeine unless in cough syrup  . Nitrofurantoin Nausea And Vomiting  . Tetracyclines & Related Nausea And Vomiting  . Ampicillin Rash  . Biaxin [Clarithromycin] Rash    Current Outpatient Prescriptions  Medication Sig Dispense Refill  . ALPRAZolam (XANAX) 1 MG tablet Take 1 mg by mouth at bedtime as needed. HS as needed and can be repeated x 1 if needed. insomnia      . aspirin 81 MG tablet Take 81 mg by mouth daily. Patient states that she is taking this every other day      . Calcium Carbonate-Vit D-Min (CALTRATE 600+D PLUS MINERALS PO) Take 1 tablet by mouth 2 (two) times daily.       . diclofenac (VOLTAREN) 50 MG EC tablet Take 75 mg by mouth daily.       Marland Kitchen HYDROcodone-acetaminophen (NORCO) 10-325 MG per tablet Take 1 tablet by mouth every 6 (six) hours as needed. pain      . lansoprazole (PREVACID) 30 MG capsule TAKE ONE CAPSULE BY MOUTH TWICE A DAY  60 capsule  5  . levothyroxine (SYNTHROID, LEVOTHROID) 112 MCG tablet Take 112 mcg by mouth daily before breakfast.       . lidocaine (LIDODERM) 5 % Place 1 patch onto the skin daily as needed. Remove & Discard patch within 12 hours or as directed by MD for pain      . Magnesium Hydroxide (MAGNESIA PO) Take 250 mg by mouth 2 (  two) times daily.       . Multiple Vitamin (MULTIVITAMIN WITH MINERALS) TABS Take 1 tablet by mouth daily.      . Omega-3 Fatty Acids (FISH OIL) 1200 MG CAPS Take 1,200 mg by mouth daily.       . pravastatin (PRAVACHOL) 80 MG tablet Take 40 mg by mouth at bedtime.       . propranolol (INDERAL) 40 MG tablet Take 40 mg by mouth 2 (two) times daily.       Marland Kitchen pyridostigmine (MESTINON) 60 MG tablet TAKE 0.5 TABLETS (30 MG TOTAL) BY MOUTH 3 (THREE) TIMES DAILY.  45 tablet  0  . SUMAtriptan (IMITREX) 50 MG tablet Take 50 mg by mouth every 2 (two) hours as needed. For migraines      . triamcinolone ointment (KENALOG) 0.5 % Apply 1 application topically 2 (two) times daily as needed. Rash       No current  facility-administered medications for this visit.    Past Medical History  Diagnosis Date  . Unspecified essential hypertension   . Mixed hyperlipidemia   . Obesity, unspecified   . Esophageal reflux   . Hypertension     over 10 yrs  . Hypothyroidism   . Migraines   . Osteoarthritis of knee     both knees  . IBS (irritable colon syndrome)   . Ocular myasthenia gravis   . Complication of anesthesia     Low blood pressure, heart rate, O2 sat  . Dysrhythmia     occ palpitations   . Heart murmur   . Anxiety   . Shortness of breath     with exertion   . Pneumonia     hx of   . Cancer     hx of skin cancer   . Anemia     Past Surgical History  Procedure Laterality Date  . Knee surgery    . Total knee arthroplasty      both knee. 2005 1st, 2011 the last one  . Abdominal hysterectomy    . Cholecystectomy    . Nasal sinus surgery      30 yrs ago  . Colonoscopy  08/24/2011    Procedure: COLONOSCOPY;  Surgeon: Rogene Houston, MD;  [facility];  Service: Endoscopy;  Laterality: N/A;  1200  . Ct guided biopsy  01/16/12  . Givens capsule study  01/29/2012    Procedure: GIVENS CAPSULE STUDY;  Surgeon: Rogene Houston, MD;  Location: [facility];  Service: Endoscopy;  Laterality: N/A;  730  . Cardiac catheterization    . Laparotomy  03/01/2012    Procedure: EXPLORATORY LAPAROTOMY;  Surgeon: Earnstine Regal, MD;  Location: [facility];  Service: General;  Laterality: N/A;  Exploratory Laparotomy ,Resection Retroperitoneal Mass    History   Social History  . Marital Status: Divorced    Spouse Name: N/A    Number of Children: N/A  . Years of Education: N/A   Occupational History  . Not on file.   Social History Main Topics  . Smoking status: Never Smoker   . Smokeless tobacco: Never Used  . Alcohol Use: No  . Drug Use: No  . Sexual Activity: Not on file   Other Topics Concern  . Not on file   Social History Narrative  . No narrative on file     No  family history of premature CAD in 1st degree relatives.  Prior to Admission medications   Medication Sig  Start Date End Date Taking? Authorizing Provider  ALPRAZolam Duanne Moron) 1 MG tablet Take 1 mg by mouth at bedtime as needed. HS as needed and can be repeated x 1 if needed. insomnia   Yes Historical Provider, MD  aspirin 81 MG tablet Take 81 mg by mouth daily. Patient states that she is taking this every other day   Yes Historical Provider, MD  Calcium Carbonate-Vit D-Min (CALTRATE 600+D PLUS MINERALS PO) Take 1 tablet by mouth 2 (two) times daily.    Yes Historical Provider, MD  diclofenac (VOLTAREN) 50 MG EC tablet Take 75 mg by mouth daily.    Yes Historical Provider, MD  HYDROcodone-acetaminophen (NORCO) 10-325 MG per tablet Take 1 tablet by mouth every 6 (six) hours as needed. pain   Yes Historical Provider, MD  lansoprazole (PREVACID) 30 MG capsule TAKE ONE CAPSULE BY MOUTH TWICE A DAY 01/12/14  Yes Butch Penny, NP  levothyroxine (SYNTHROID, LEVOTHROID) 112 MCG tablet Take 112 mcg by mouth daily before breakfast.    Yes Historical Provider, MD  lidocaine (LIDODERM) 5 % Place 1 patch onto the skin daily as needed. Remove & Discard patch within 12 hours or as directed by MD for pain   Yes Historical Provider, MD  Magnesium Hydroxide (MAGNESIA PO) Take 250 mg by mouth 2 (two) times daily.    Yes Historical Provider, MD  Multiple Vitamin (MULTIVITAMIN WITH MINERALS) TABS Take 1 tablet by mouth daily.   Yes Historical Provider, MD  Omega-3 Fatty Acids (FISH OIL) 1200 MG CAPS Take 1,200 mg by mouth daily.    Yes Historical Provider, MD  pravastatin (PRAVACHOL) 80 MG tablet Take 40 mg by mouth at bedtime.    Yes Historical Provider, MD  propranolol (INDERAL) 40 MG tablet Take 40 mg by mouth 2 (two) times daily.    Yes Historical Provider, MD  pyridostigmine (MESTINON) 60 MG tablet TAKE 0.5 TABLETS (30 MG TOTAL) BY MOUTH 3 (THREE) TIMES DAILY. 07/28/13  Yes Kathrynn Ducking, MD  SUMAtriptan  (IMITREX) 50 MG tablet Take 50 mg by mouth every 2 (two) hours as needed. For migraines   Yes Historical Provider, MD  triamcinolone ointment (KENALOG) 0.5 % Apply 1 application topically 2 (two) times daily as needed. Rash 10/11/11  Yes Historical Provider, MD     Review of systems complete and found to be negative unless listed above in HPI     Physical exam Height 5\' 4"  (1.626 m), weight 197 lb (89.359 kg).  BP 128/81  Pulse 51   General: NAD Neck: No JVD, no thyromegaly or thyroid nodule.  Lungs: Clear to auscultation bilaterally with normal respiratory effort. CV: Nondisplaced PMI. Regular rate and rhythm, normal S1/S2, no S3/S4, no murmur.  No peripheral edema.  No carotid bruit.  Normal pedal pulses.  Abdomen: Soft, tenderness to palpation over inferior aspect of left rib cage, no hepatosplenomegaly, no distention.  Skin: Intact without lesions or rashes.  Neurologic: Alert and oriented x 3.  Psych: Normal affect. Extremities: No clubbing or cyanosis.  HEENT: Normal.   ECG: Most recent ECG reviewed.  Labs:   Lab Results  Component Value Date   WBC 5.1 01/06/2013   HGB 13.0 01/06/2013   HCT 40.1 01/06/2013   MCV 101.5* 01/06/2013   PLT 192 01/06/2013   No results found for this basename: NA, K, CL, CO2, BUN, CREATININE, CALCIUM, LABALBU, PROT, BILITOT, ALKPHOS, ALT, AST, GLUCOSE,  in the last 168 hours No results found for this basename: CKTOTAL, CKMB,  CKMBINDEX, TROPONINI    No results found for this basename: CHOL   No results found for this basename: HDL   No results found for this basename: LDLCALC   No results found for this basename: TRIG   No results found for this basename: CHOLHDL   No results found for this basename: LDLDIRECT         Studies: No results found.  ASSESSMENT AND PLAN:  1. Chest pain in the context of GERD, anxiety, and recent fall: Atypical for a cardiac etiology. ECG without ischemic abnormalities. No exertional symptoms.  Symptoms are likely related to recent trauma due to her fall as well as other symptoms related to anxiety and gastroesophageal reflux disease. Many of her symptoms have resolved with rest and increasing the frequency of her proton pump inhibitor. We had a long discussion with regards to further management regarding stress testing or watchful waiting. She prefers to watch fully weight. I told her to inform me if symptoms recur, at which time I would proceed with a nuclear cardiac stress test. 2. Essential HTN: Well controlled on current therapy which includes propranolol. No changes. 3. Hyperlipidemia: On pravastatin 40 mg. No changes.  Dispo: f/u 3-4 months.  Signed: Kate Sable, M.D., F.A.C.C.  01/13/2014, 4:13 PM

## 2014-01-14 NOTE — Progress Notes (Signed)
This encounter was created in error - please disregard.

## 2014-01-26 ENCOUNTER — Encounter (HOSPITAL_COMMUNITY): Payer: Medicare Other

## 2014-01-26 ENCOUNTER — Encounter (HOSPITAL_COMMUNITY): Payer: Self-pay

## 2014-01-26 ENCOUNTER — Encounter (HOSPITAL_COMMUNITY): Payer: Medicare Other | Attending: Hematology and Oncology

## 2014-01-26 DIAGNOSIS — K219 Gastro-esophageal reflux disease without esophagitis: Secondary | ICD-10-CM

## 2014-01-26 DIAGNOSIS — D483 Neoplasm of uncertain behavior of retroperitoneum: Secondary | ICD-10-CM

## 2014-01-26 DIAGNOSIS — G7000 Myasthenia gravis without (acute) exacerbation: Secondary | ICD-10-CM | POA: Diagnosis not present

## 2014-01-26 DIAGNOSIS — E039 Hypothyroidism, unspecified: Secondary | ICD-10-CM

## 2014-01-26 DIAGNOSIS — D447 Neoplasm of uncertain behavior of aortic body and other paraganglia: Secondary | ICD-10-CM | POA: Insufficient documentation

## 2014-01-26 LAB — COMPREHENSIVE METABOLIC PANEL
ALT: 10 U/L (ref 0–35)
ANION GAP: 11 (ref 5–15)
AST: 18 U/L (ref 0–37)
Albumin: 3.7 g/dL (ref 3.5–5.2)
Alkaline Phosphatase: 61 U/L (ref 39–117)
BUN: 15 mg/dL (ref 6–23)
CO2: 25 mEq/L (ref 19–32)
CREATININE: 1.18 mg/dL — AB (ref 0.50–1.10)
Calcium: 9.6 mg/dL (ref 8.4–10.5)
Chloride: 103 mEq/L (ref 96–112)
GFR calc non Af Amer: 44 mL/min — ABNORMAL LOW (ref >90)
GFR, EST AFRICAN AMERICAN: 51 mL/min — AB (ref >90)
GLUCOSE: 116 mg/dL — AB (ref 70–99)
Potassium: 3.9 mEq/L (ref 3.7–5.3)
SODIUM: 139 meq/L (ref 137–147)
TOTAL PROTEIN: 7.4 g/dL (ref 6.0–8.3)
Total Bilirubin: 0.5 mg/dL (ref 0.3–1.2)

## 2014-01-26 LAB — CBC WITH DIFFERENTIAL/PLATELET
Basophils Absolute: 0 10*3/uL (ref 0.0–0.1)
Basophils Relative: 1 % (ref 0–1)
EOS PCT: 3 % (ref 0–5)
Eosinophils Absolute: 0.1 10*3/uL (ref 0.0–0.7)
HCT: 42.7 % (ref 36.0–46.0)
Hemoglobin: 14.2 g/dL (ref 12.0–15.0)
Lymphocytes Relative: 34 % (ref 12–46)
Lymphs Abs: 1.9 10*3/uL (ref 0.7–4.0)
MCH: 33 pg (ref 26.0–34.0)
MCHC: 33.3 g/dL (ref 30.0–36.0)
MCV: 99.3 fL (ref 78.0–100.0)
Monocytes Absolute: 0.5 10*3/uL (ref 0.1–1.0)
Monocytes Relative: 8 % (ref 3–12)
Neutro Abs: 3 10*3/uL (ref 1.7–7.7)
Neutrophils Relative %: 54 % (ref 43–77)
PLATELETS: 179 10*3/uL (ref 150–400)
RBC: 4.3 MIL/uL (ref 3.87–5.11)
RDW: 12.9 % (ref 11.5–15.5)
WBC: 5.6 10*3/uL (ref 4.0–10.5)

## 2014-01-26 LAB — SEDIMENTATION RATE: SED RATE: 2 mm/h (ref 0–22)

## 2014-01-26 LAB — LACTATE DEHYDROGENASE: LDH: 212 U/L (ref 94–250)

## 2014-01-26 NOTE — Progress Notes (Signed)
[facility]  OFFICE PROGRESS NOTE  [place], [facility], MD  [place] [address]  DIAGNOSIS: Paraganglioma - Plan: CBC with Differential, Comprehensive metabolic panel, Lactate dehydrogenase, Sedimentation rate, Chromogranin A, Serotonin serum, CBC with Differential, Comprehensive metabolic panel, Serotonin serum, Sedimentation rate, Lactate dehydrogenase, Chromogranin A  Chief Complaint  Patient presents with  . paraganglioma, retroperitoneal    CURRENT THERAPY: Watchful expectation and surveillance  INTERVAL HISTORY: [name] 73 y.o. 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 Patient fell about 2 weeks ago in the emergency room. X-rays were negative for fracture. She's also had some crampy abdominal pain over the weekend which was relieved by dicyclomine. She's also had increasing chest discomfort which was relieved by high doses of Prevacid. She denies any hot flashes, lower extremity swelling, palpitations, orthopnea, PND, abdominal distention, nausea, vomiting, diarrhea, constipation, skin rash, headache, or seizures. She also denies any flushing.  MEDICAL HISTORY: Past Medical History  Diagnosis Date  . Unspecified essential hypertension   . Mixed hyperlipidemia   . Obesity, unspecified   . Esophageal reflux   . Hypertension     over 10 yrs  . Hypothyroidism   . Migraines   . Osteoarthritis of knee     both knees  . IBS (irritable colon syndrome)   . Ocular myasthenia gravis   . Complication of anesthesia     Low blood pressure, heart rate, O2 sat  . Dysrhythmia     occ palpitations   . Heart murmur   . Anxiety   . Shortness of breath     with exertion   . Pneumonia     hx of   . Cancer     hx of skin cancer   . Anemia     INTERIM HISTORY: has Hypertension; Hypothyroidism; Arthritis; High  cholesterol; GERD (gastroesophageal reflux disease); Rectal bleeding; IBS (irritable bowel syndrome); Abdominal pain, bilateral lower quadrant; Neoplasm of uncertain behavior of retroperitoneum, paraganglioma; and Weight loss on her problem list.    ALLERGIES:  is allergic to macrodantin; clarithromycin; codeine; nitrofurantoin; tetracyclines & related; ampicillin; and biaxin.  MEDICATIONS: has a current medication list which includes the following prescription(s): alprazolam, aspirin, calcium carbonate-vit d-min, diclofenac, hydrocodone-acetaminophen, lansoprazole, levothyroxine, lidocaine, magnesium hydroxide, multivitamin with minerals, NON FORMULARY, fish oil, pravastatin, propranolol, pyridostigmine, sumatriptan, and triamcinolone ointment.  SURGICAL HISTORY:  Past Surgical History  Procedure Laterality Date  . Knee surgery    . Total knee arthroplasty      both knee. 2005 1st, 2011 the last one  . Abdominal hysterectomy    . Cholecystectomy    . Nasal sinus surgery      30 yrs ago  . Colonoscopy  08/24/2011    Procedure: COLONOSCOPY;  Surgeon: Rogene Houston, MD;  [facility];  Service: Endoscopy;  Laterality: N/A;  1200  . Ct guided biopsy  01/16/12  . Givens capsule study  01/29/2012    Procedure: GIVENS CAPSULE STUDY;  Surgeon: Rogene Houston, MD;  [facility];  Service: Endoscopy;  Laterality: N/A;  730  . Cardiac catheterization    . Laparotomy  03/01/2012    Procedure: EXPLORATORY LAPAROTOMY;  Surgeon: Earnstine Regal, MD;  Location: [facility];  Service: General;  Laterality: N/A;  Exploratory Laparotomy ,Resection Retroperitoneal Mass    FAMILY HISTORY: family history includes  Inflammatory bowel disease in her maternal grandmother.  SOCIAL HISTORY:  reports that she has never smoked. She has never used smokeless tobacco. She reports that she does not drink alcohol or use illicit drugs.  REVIEW OF SYSTEMS:  Other than that discussed above is  noncontributory.  PHYSICAL EXAMINATION: ECOG PERFORMANCE STATUS: 1 - Symptomatic but completely ambulatory  Blood pressure 114/75, pulse 52, temperature 97.8 F (36.6 C), temperature source Oral, resp. rate 18, weight 199 lb 3.2 oz (90.357 kg), SpO2 99 %.  GENERAL:alert, no distress and comfortable SKIN: skin color, texture, turgor are normal, no rashes or significant lesions EYES: PERLA; Conjunctiva are pink and non-injected, sclera clear SINUSES: No redness or tenderness over maxillary or ethmoid sinuses OROPHARYNX:no exudate, no erythema on lips, buccal mucosa, or tongue. NECK: supple, thyroid normal size, non-tender, without nodularity. No masses CHEST: normal AP diameter with no breast masses.tenderness of the left chest wall. LYMPH:  no palpable lymphadenopathy in the cervical, axillary or inguinal LUNGS: clear to auscultation and percussion with normal breathing effort HEART: regular rate & rhythm and no murmurs. ABDOMEN:abdomen soft, non-tender and normal bowel sounds. No hepatomegaly, ascites, or CVA tenderness. MUSCULOSKELETAL:no cyanosis of digits and no clubbing. Range of motion normal.  NEURO: alert & oriented x 3 with fluent speech, no focal motor/sensory deficits   LABORATORY DATA: Office Visit on 01/26/2014  Component Date Value Ref Range Status  . WBC 01/26/2014 5.6  4.0 - 10.5 K/uL Final  . RBC 01/26/2014 4.30  3.87 - 5.11 MIL/uL Final  . Hemoglobin 01/26/2014 14.2  12.0 - 15.0 g/dL Final  . HCT 01/26/2014 42.7  36.0 - 46.0 % Final  . MCV 01/26/2014 99.3  78.0 - 100.0 fL Final  . MCH 01/26/2014 33.0  26.0 - 34.0 pg Final  . MCHC 01/26/2014 33.3  30.0 - 36.0 g/dL Final  . RDW 01/26/2014 12.9  11.5 - 15.5 % Final  . Platelets 01/26/2014 179  150 - 400 K/uL Final  . Neutrophils Relative % 01/26/2014 54  43 - 77 % Final  . Neutro Abs 01/26/2014 3.0  1.7 - 7.7 K/uL Final  . Lymphocytes Relative 01/26/2014 34  12 - 46 % Final  . Lymphs Abs 01/26/2014 1.9  0.7 - 4.0  K/uL Final  . Monocytes Relative 01/26/2014 8  3 - 12 % Final  . Monocytes Absolute 01/26/2014 0.5  0.1 - 1.0 K/uL Final  . Eosinophils Relative 01/26/2014 3  0 - 5 % Final  . Eosinophils Absolute 01/26/2014 0.1  0.0 - 0.7 K/uL Final  . Basophils Relative 01/26/2014 1  0 - 1 % Final  . Basophils Absolute 01/26/2014 0.0  0.0 - 0.1 K/uL Final    PATHOLOGY:no new pathology.  Urinalysis    Component Value Date/Time   COLORURINE YELLOW 04/08/2012 1505   APPEARANCEUR CLEAR 04/08/2012 1505   LABSPEC <1.005* 04/08/2012 1505   PHURINE 6.0 04/08/2012 1505   GLUCOSEU NEGATIVE 04/08/2012 1505   HGBUR TRACE* 04/08/2012 1505   BILIRUBINUR NEGATIVE 04/08/2012 1505   KETONESUR NEGATIVE 04/08/2012 1505   PROTEINUR NEGATIVE 04/08/2012 1505   UROBILINOGEN 0.2 04/08/2012 1505   NITRITE NEGATIVE 04/08/2012 1505   LEUKOCYTESUR NEGATIVE 04/08/2012 1505    RADIOGRAPHIC STUDIES: No results found. MM DIGITAL SCREENING BILATERAL   Status: Final result       Study Result     CLINICAL DATA: Screening.  EXAM: DIGITAL SCREENING BILATERAL MAMMOGRAM WITH CAD  COMPARISON: Previous exam(s).  ACR Breast Density Category b: There are scattered areas  of fibroglandular density.  FINDINGS: There are no findings suspicious for malignancy. Images were processed with CAD.  IMPRESSION: No mammographic evidence of malignancy. A result letter of this screening mammogram will be mailed directly to the patient.  RECOMMENDATION: Screening mammogram in one year. (Code:SM-B-01Y)  BI-RADS CATEGORY 1: Negative.   Electronically Signed  By: Skipper Cliche M.D.  On: 09/30/2013    DG Ribs Unilateral W/Chest Left   Status: Final result       PACS Images     Show images for DG Ribs Unilateral W/Chest Left     Study Result     CLINICAL DATA: Left-sided anterior rib pain. Shortness of breath. Fell on sidewalk 1 week ago.  EXAM: LEFT RIBS AND CHEST - 3+ VIEW  COMPARISON:  CT chest 01/13/2013. Chest x-ray 01/01/2012.  FINDINGS: Mediastinum hilar structures are normal. Heart size stable. Pulmonary vascularity normal. No focal pulmonary infiltrates. No evidence of pneumothorax.  No evidence of displaced rib fracture or focal bony lesion. Surgical clips in the upper abdomen.  IMPRESSION: No evidence of displaced rib fracture or focal bony lesion . No pneumothorax.   Electronically Signed  By: Marcello Moores Register  On: 12/19/2013 12:46     ASSESSMENT:  #1. Paraganglioma of the retroperitoneum, status post resection, no evidence of disease. #2. Probable irritable bowel syndrome. #3. Ocular myasthenia gravis. #4. Hypothyroidism, on Synthroid replacement. #5. Scoliosis with chronic back pain. #6. Gastroesophageal reflux disease, controlled on Prevacid 30 mg twice a day.   PLAN:  #1. If, after review of lab tests, no evidence of recurrent paraganglioma is found, the patient will be seen again in one year. #2. Follow-up in one year with CBC, chem profile, serotonin level, chromogranin A level.   All questions were answered. The patient knows to call the clinic with any problems, questions or concerns. We can certainly see the patient much sooner if necessary.   I spent 25 minutes counseling the patient face to face. The total time spent in the appointment was 30 minutes.    Doroteo Bradford, MD 01/26/2014 3:07 PM  DISCLAIMER:  This note was dictated with voice recognition software.  Similar sounding words can inadvertently be transcribed inaccurately and may not be corrected upon review.

## 2014-01-26 NOTE — Patient Instructions (Signed)
[facility] Discharge Instructions  RECOMMENDATIONS MADE BY THE CONSULTANT AND ANY TEST RESULTS WILL BE SENT TO YOUR REFERRING PHYSICIAN.  We will see you in one year for follow up. Please call for any questions or concerns. We will call you if any of your lab work is abnormal or needs further attention.   Thank you for choosing [facility] to provide your oncology and hematology care.  To afford each patient quality time with our providers, please arrive at least 15 minutes before your scheduled appointment time.  With your help, our goal is to use those 15 minutes to complete the necessary work-up to ensure our physicians have the information they need to help with your evaluation and healthcare recommendations.    Effective January 1st, 2014, we ask that you re-schedule your appointment with our physicians should you arrive 10 or more minutes late for your appointment.  We strive to give you quality time with our providers, and arriving late affects you and other patients whose appointments are after yours.    Again, thank you for choosing [facility].  Our hope is that these requests will decrease the amount of time that you wait before being seen by our physicians.       _____________________________________________________________  Should you have questions after your visit to [facility], please contact our office at [phone] between the hours of 8:30 a.m. and 4:30 p.m.  Voicemails left after 4:30 p.m. will not be returned until the following business day.  For prescription refill requests, have your pharmacy contact our office with your prescription refill request.    _______________________________________________________________  We hope that we have given you very good care.  You may receive a patient satisfaction survey in the mail, please complete it and return it as soon as possible.  We value your  feedback!  _______________________________________________________________  Have you asked about our STAR program?  STAR stands for Survivorship Training and Rehabilitation, and this is a nationally recognized cancer care program that focuses on survivorship and rehabilitation.  Cancer and cancer treatments may cause problems, such as, pain, making you feel tired and keeping you from doing the things that you need or want to do. Cancer rehabilitation can help. Our goal is to reduce these troubling effects and help you have the best quality of life possible.  You may receive a survey from a nurse that asks questions about your current state of health.  Based on the survey results, all eligible patients will be referred to the [facility] program for an evaluation so we can better serve you!  A frequently asked questions sheet is available upon request.

## 2014-01-26 NOTE — Progress Notes (Signed)
[name]'s reason for visit today are for labs as scheduled per MD orders.  Venipuncture performed with a 23 gauge butterfly needle to L Antecubital.  [name] tolerated venipuncture well and without incident; questions were answered and patient was discharged.

## 2014-01-27 DIAGNOSIS — E785 Hyperlipidemia, unspecified: Secondary | ICD-10-CM | POA: Diagnosis not present

## 2014-01-29 DIAGNOSIS — N182 Chronic kidney disease, stage 2 (mild): Secondary | ICD-10-CM | POA: Diagnosis not present

## 2014-01-29 DIAGNOSIS — I10 Essential (primary) hypertension: Secondary | ICD-10-CM | POA: Diagnosis not present

## 2014-01-29 DIAGNOSIS — E782 Mixed hyperlipidemia: Secondary | ICD-10-CM | POA: Diagnosis not present

## 2014-01-30 LAB — SEROTONIN SERUM: SEROTONIN, SERUM: 64 ng/mL (ref 56–244)

## 2014-01-30 LAB — CHROMOGRANIN A: CHROMOGRANIN A: 35 ng/mL — AB (ref <=15)

## 2014-02-02 DIAGNOSIS — G7000 Myasthenia gravis without (acute) exacerbation: Secondary | ICD-10-CM | POA: Diagnosis not present

## 2014-02-02 DIAGNOSIS — N905 Atrophy of vulva: Secondary | ICD-10-CM | POA: Diagnosis not present

## 2014-02-02 DIAGNOSIS — R312 Other microscopic hematuria: Secondary | ICD-10-CM | POA: Diagnosis not present

## 2014-02-02 DIAGNOSIS — N3090 Cystitis, unspecified without hematuria: Secondary | ICD-10-CM | POA: Diagnosis not present

## 2014-03-11 ENCOUNTER — Encounter (INDEPENDENT_AMBULATORY_CARE_PROVIDER_SITE_OTHER): Payer: Self-pay | Admitting: *Deleted

## 2014-03-28 DIAGNOSIS — G7001 Myasthenia gravis with (acute) exacerbation: Secondary | ICD-10-CM | POA: Diagnosis not present

## 2014-03-28 DIAGNOSIS — N182 Chronic kidney disease, stage 2 (mild): Secondary | ICD-10-CM | POA: Diagnosis not present

## 2014-03-28 DIAGNOSIS — I10 Essential (primary) hypertension: Secondary | ICD-10-CM | POA: Diagnosis not present

## 2014-03-28 DIAGNOSIS — E782 Mixed hyperlipidemia: Secondary | ICD-10-CM | POA: Diagnosis not present

## 2014-03-28 DIAGNOSIS — R000 Tachycardia, unspecified: Secondary | ICD-10-CM | POA: Diagnosis not present

## 2014-03-30 ENCOUNTER — Ambulatory Visit (INDEPENDENT_AMBULATORY_CARE_PROVIDER_SITE_OTHER): Payer: Medicare Other | Admitting: *Deleted

## 2014-03-30 VITALS — BP 128/90 | HR 45

## 2014-03-30 DIAGNOSIS — I10 Essential (primary) hypertension: Secondary | ICD-10-CM

## 2014-03-30 NOTE — Progress Notes (Signed)
Dr. Nevada Crane increased [name] to TID from BID. Pt seen Dr. Nevada Crane on Saturday, pt c/o fatigue and BP problems. Pt also had labs drawn at [facility] on Saturday. Pt c/o of fatigue, and nausea. Updated pt medication list.

## 2014-03-31 ENCOUNTER — Telehealth: Payer: Self-pay | Admitting: *Deleted

## 2014-03-31 NOTE — Telephone Encounter (Signed)
-----   Message from Herminio Commons, MD sent at 03/31/2014 10:19 AM EST ----- Can have her see PA/NP later this week. I see no labs from [facility].  Dr. [name] ----- Message -----    From: Massie Maroon, CMA    Sent: 03/30/2014   4:33 PM      To: Herminio Commons, MD  Pt care taker is calling back regarding nurse visit feedback from this morning. Pt also noted HX of para ganglioma and that she has never had BP problems before. Please advise. Pt would like to be seen and would come to [facility] if necessary.  Thanks,  [employer]

## 2014-03-31 NOTE — Telephone Encounter (Signed)
Pt is scheduled to see Estella Husk, PA-C 1/13 [facility]. Pt had labs drawn Saturday, they have not been sent to Dr. Nevada Crane as of today. Pt made aware of appt.

## 2014-04-01 ENCOUNTER — Encounter: Payer: Self-pay | Admitting: Physician Assistant

## 2014-04-01 ENCOUNTER — Ambulatory Visit (INDEPENDENT_AMBULATORY_CARE_PROVIDER_SITE_OTHER): Payer: Medicare Other | Admitting: Physician Assistant

## 2014-04-01 ENCOUNTER — Encounter: Payer: Self-pay | Admitting: *Deleted

## 2014-04-01 VITALS — BP 118/62 | HR 93 | Ht 64.0 in | Wt 192.0 lb

## 2014-04-01 DIAGNOSIS — I10 Essential (primary) hypertension: Secondary | ICD-10-CM

## 2014-04-01 DIAGNOSIS — R079 Chest pain, unspecified: Secondary | ICD-10-CM

## 2014-04-01 DIAGNOSIS — D447 Neoplasm of uncertain behavior of aortic body and other paraganglia: Secondary | ICD-10-CM | POA: Diagnosis not present

## 2014-04-01 DIAGNOSIS — R001 Bradycardia, unspecified: Secondary | ICD-10-CM | POA: Diagnosis not present

## 2014-04-01 MED ORDER — PROPRANOLOL HCL 40 MG PO TABS
40.0000 mg | ORAL_TABLET | Freq: Two times a day (BID) | ORAL | Status: DC
Start: 2014-04-01 — End: 2014-04-05

## 2014-04-01 MED ORDER — LISINOPRIL 10 MG PO TABS: 10.0000 mg | ORAL_TABLET | Freq: Every day | ORAL | Status: DC

## 2014-04-01 NOTE — Progress Notes (Signed)
Primary physician Dr. Wende Neighbors Cardiologist: Dr. 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 She had a cardiac catheterization 15 years ago that showed "33% blockage in a small artery". Her chest pain was in the context of GERD, anxiety and a recent fall. They decided on watchful waiting. If she had recurrent symptoms he would proceed with a nuclear cardiac stress test. She also has hypertension and hyperlipidemia that were both treated and controlled.  Patient saw Dr. [place] Crane on Saturday complaining of fatigue and her blood pressure was elevated.Her Inderal was increased to 3 times a day. She had blood work done but results aren't in the computer yet.  Overall she feels terrible. She has extreme fatigue, no appetite, blood pressures been running between 983 and 382 systolic according to her daughter. She's also had some chest pressure over the weekend mostly at rest that has been off-and-on. 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 She is followed closely by oncology here and had full blood work in November 2015. She has not had recent scans.    Allergies  Allergen Reactions  . Macrodantin [Nitrofurantoin Macrocrystal] Nausea And Vomiting  . Clarithromycin   . Codeine     Can not take Codeine unless in cough syrup  . Nitrofurantoin Nausea And Vomiting  . Tetracyclines & Related Nausea And Vomiting  . Ampicillin Rash  . Biaxin [Clarithromycin] Rash     Current Outpatient Prescriptions  Medication Sig Dispense Refill  . ALPRAZolam (XANAX) 1 MG tablet Take 1 mg by mouth at bedtime as needed. HS as needed and can be repeated x 1 if needed. insomnia    . aspirin 81 MG tablet Take 81 mg by mouth daily. Patient states that she is taking this every other day    . Calcium Carbonate-Vit D-Min (CALTRATE 600+D PLUS MINERALS PO) Take 1 tablet by mouth 2 (two)  times daily.     . diclofenac (VOLTAREN) 50 MG EC tablet Take 75 mg by mouth daily.     Marland Kitchen HYDROcodone-acetaminophen (NORCO) 10-325 MG per tablet Take 1 tablet by mouth every 6 (six) hours as needed. pain    . lansoprazole (PREVACID) 30 MG capsule TAKE ONE CAPSULE BY MOUTH TWICE A DAY 60 capsule 5  . levothyroxine (SYNTHROID, LEVOTHROID) 112 MCG tablet Take 112 mcg by mouth daily before breakfast.     . lidocaine (LIDODERM) 5 % Place 1 patch onto the skin daily as needed. Remove & Discard patch within 12 hours or as directed by MD for pain    . Magnesium Hydroxide (MAGNESIA PO) Take 250 mg by mouth 2 (two) times daily.     . Multiple Vitamin (MULTIVITAMIN WITH MINERALS) TABS Take 1 tablet by mouth daily.    . NON FORMULARY Nail clear from dermatologist And probiotic    . Omega-3 Fatty Acids (FISH OIL) 1200 MG CAPS Take 1,200 mg by mouth daily.     . pravastatin (PRAVACHOL) 80 MG tablet Take 40 mg by mouth at bedtime.     . propranolol (INDERAL) 40 MG tablet Take 40 mg by mouth 3 (three) times daily.     Marland Kitchen pyridostigmine (MESTINON) 60 MG tablet TAKE 0.5 TABLETS (30 MG TOTAL) BY MOUTH 3 (THREE) TIMES DAILY. 45 tablet 0  . SUMAtriptan (IMITREX) 50 MG tablet Take 50 mg by mouth every 2 (two) hours as needed. For  migraines    . triamcinolone ointment (KENALOG) 0.5 % Apply 1 application topically 2 (two) times daily as needed. Rash     No current facility-administered medications for this visit.    Past Medical History  Diagnosis Date  . Unspecified essential hypertension   . Mixed hyperlipidemia   . Obesity, unspecified   . Esophageal reflux   . Hypertension     over 10 yrs  . Hypothyroidism   . Migraines   . Osteoarthritis of knee     both knees  . IBS (irritable colon syndrome)   . Ocular myasthenia gravis   . Complication of anesthesia     Low blood pressure, heart rate, O2 sat  . Dysrhythmia     occ palpitations   . Heart murmur   . Anxiety   . Shortness of breath     with  exertion   . Pneumonia     hx of   . Cancer     hx of skin cancer   . Anemia     Past Surgical History  Procedure Laterality Date  . Knee surgery    . Total knee arthroplasty      both knee. 2005 1st, 2011 the last one  . Abdominal hysterectomy    . Cholecystectomy    . Nasal sinus surgery      30 yrs ago  . Colonoscopy  08/24/2011    Procedure: COLONOSCOPY;  Surgeon: Rogene Houston, MD;  Location: [facility];  Service: Endoscopy;  Laterality: N/A;  1200  . Ct guided biopsy  01/16/12  . Givens capsule study  01/29/2012    Procedure: GIVENS CAPSULE STUDY;  Surgeon: Rogene Houston, MD;  Location: [facility];  Service: Endoscopy;  Laterality: N/A;  730  . Cardiac catheterization    . Laparotomy  03/01/2012    Procedure: EXPLORATORY LAPAROTOMY;  Surgeon: Earnstine Regal, MD;  Location: [facility];  Service: General;  Laterality: N/A;  Exploratory Laparotomy ,Resection Retroperitoneal Mass    Family History  Problem Relation Age of Onset  . Inflammatory bowel disease Maternal Grandmother     History   Social History  . Marital Status: Divorced    Spouse Name: N/A    Number of Children: N/A  . Years of Education: N/A   Occupational History  . Not on file.   Social History Main Topics  . Smoking status: Never Smoker   . Smokeless tobacco: Never Used  . Alcohol Use: No  . Drug Use: No  . Sexual Activity: Not on file   Other Topics Concern  . Not on file   Social History Narrative    ROS: See history of present illness otherwise negative  BP 118/62 mmHg  Pulse 93  Ht 5\' 4"  (1.626 m)  Wt 192 lb (87.091 kg)  BMI 32.94 kg/m2  PHYSICAL EXAM: Obese, in no acute distress. Neck: No JVD, HJR, Bruit, or thyroid enlargement  Lungs: No tachypnea, clear without wheezing, rales, or rhonchi  Cardiovascular: RRR, PMI not displaced, Normal S1 and S2, no murmurs, gallops, bruit, thrill, or heave.  Abdomen: BS normal. Soft without organomegaly, masses, lesions or  tenderness.  Extremities: without cyanosis, clubbing or edema. Good distal pulses bilateral  SKin: Warm, no lesions or rashes   Musculoskeletal: No deformities  Neuro: no focal signs   Wt Readings from Last 3 Encounters:  01/26/14 199 lb 3.2 oz (90.357 kg)  01/13/14 197 lb (89.359 kg)  06/10/13 191 lb 12.8 oz (  87 kg)    Lab Results  Component Value Date   WBC 5.6 01/26/2014   HGB 14.2 01/26/2014   HCT 42.7 01/26/2014   PLT 179 01/26/2014   GLUCOSE 116* 01/26/2014   ALT 10 01/26/2014   AST 18 01/26/2014   NA 139 01/26/2014   K 3.9 01/26/2014   CL 103 01/26/2014   CREATININE 1.18* 01/26/2014   BUN 15 01/26/2014   CO2 25 01/26/2014   INR 1.00 01/16/2012    EKG: Sinus bradycardia at 40 bpm left axis deviation, no acute change

## 2014-04-01 NOTE — Addendum Note (Signed)
Addended by: Levonne Hubert on: 04/01/2014 03:23 PM   Modules accepted: Orders

## 2014-04-01 NOTE — Assessment & Plan Note (Signed)
Patient's heart rate is 40 bpm since increasing her Inderal to 3 times a day. Decrease Inderal to 40 mg twice a day. Patient and daughter will monitor her heart rate at home. She may need to decrease Inderal further. She says this is a miracle drug for her migraines and is reluctant to stop.

## 2014-04-01 NOTE — Assessment & Plan Note (Signed)
Patient has had a history of chest pain. Had nonobstructive CAD on cath approximately 15 years ago. We'll proceed with Lexi scan Myoview.

## 2014-04-01 NOTE — Assessment & Plan Note (Signed)
Blood pressures been up and down. She is worried that this could be an indication of a recurrence of her paraganglioma. She takes Inderal for migraines. She hasn't had hypertension for many years. She used to take lisinopril 40 mg daily but was able stop it once she lost 80 pounds. I discussed this patient in detail with Dr.Koneswaran. We will start her on lisinopril 10 mg once daily. Decrease Inderal to 40 mg twice a day cousin of bradycardia and fatigue. Refer to oncology for further workup of paraganglioma. Trying to obtain results of her lab work on Saturday.

## 2014-04-01 NOTE — Patient Instructions (Addendum)
Your physician recommends that you schedule a follow-up appointment in: 1-2 months with Dr. [name]  Your physician has requested that you have a lexiscan myoview. For further information please visit [email or web address]. Please follow instruction sheet, as given.  Your physician has recommended you make the following change in your medication:   Decrease Inderal 40 mg to two times daily   Start Lisinopril 10 mg daily   Follow -up with Oncology   Thank you for choosing [facility]!

## 2014-04-02 NOTE — Addendum Note (Signed)
Addended by: Levonne Hubert on: 04/02/2014 08:52 AM   Modules accepted: Orders

## 2014-04-02 NOTE — Addendum Note (Signed)
Addended by: Levonne Hubert on: 04/02/2014 09:43 AM   Modules accepted: Orders

## 2014-04-03 ENCOUNTER — Encounter: Payer: Self-pay | Admitting: Physician Assistant

## 2014-04-04 ENCOUNTER — Encounter (HOSPITAL_COMMUNITY): Payer: Self-pay | Admitting: Nurse Practitioner

## 2014-04-04 ENCOUNTER — Emergency Department (HOSPITAL_COMMUNITY): Payer: Medicare Other

## 2014-04-04 ENCOUNTER — Telehealth: Payer: Self-pay | Admitting: Physician Assistant

## 2014-04-04 ENCOUNTER — Observation Stay (HOSPITAL_COMMUNITY): Payer: Medicare Other

## 2014-04-04 ENCOUNTER — Observation Stay (HOSPITAL_COMMUNITY)
Admission: EM | Admit: 2014-04-04 | Discharge: 2014-04-05 | Disposition: A | Discharge status: Home / Self Care | Payer: Medicare Other | Attending: Cardiology | Admitting: Cardiology

## 2014-04-04 DIAGNOSIS — R109 Unspecified abdominal pain: Secondary | ICD-10-CM

## 2014-04-04 DIAGNOSIS — I10 Essential (primary) hypertension: Secondary | ICD-10-CM | POA: Diagnosis not present

## 2014-04-04 DIAGNOSIS — Z888 Allergy status to other drugs, medicaments and biological substances status: Secondary | ICD-10-CM | POA: Diagnosis not present

## 2014-04-04 DIAGNOSIS — I480 Paroxysmal atrial fibrillation: Secondary | ICD-10-CM | POA: Diagnosis not present

## 2014-04-04 DIAGNOSIS — D447 Neoplasm of uncertain behavior of aortic body and other paraganglia: Secondary | ICD-10-CM | POA: Insufficient documentation

## 2014-04-04 DIAGNOSIS — E039 Hypothyroidism, unspecified: Secondary | ICD-10-CM | POA: Insufficient documentation

## 2014-04-04 DIAGNOSIS — R072 Precordial pain: Secondary | ICD-10-CM | POA: Diagnosis not present

## 2014-04-04 DIAGNOSIS — G7000 Myasthenia gravis without (acute) exacerbation: Secondary | ICD-10-CM | POA: Diagnosis not present

## 2014-04-04 DIAGNOSIS — K5900 Constipation, unspecified: Secondary | ICD-10-CM | POA: Diagnosis not present

## 2014-04-04 DIAGNOSIS — R079 Chest pain, unspecified: Secondary | ICD-10-CM

## 2014-04-04 DIAGNOSIS — M170 Bilateral primary osteoarthritis of knee: Secondary | ICD-10-CM | POA: Insufficient documentation

## 2014-04-04 DIAGNOSIS — I4891 Unspecified atrial fibrillation: Secondary | ICD-10-CM | POA: Diagnosis not present

## 2014-04-04 DIAGNOSIS — Z885 Allergy status to narcotic agent status: Secondary | ICD-10-CM | POA: Diagnosis not present

## 2014-04-04 DIAGNOSIS — Z85828 Personal history of other malignant neoplasm of skin: Secondary | ICD-10-CM | POA: Insufficient documentation

## 2014-04-04 DIAGNOSIS — G43909 Migraine, unspecified, not intractable, without status migrainosus: Secondary | ICD-10-CM | POA: Insufficient documentation

## 2014-04-04 DIAGNOSIS — N179 Acute kidney failure, unspecified: Secondary | ICD-10-CM | POA: Diagnosis not present

## 2014-04-04 DIAGNOSIS — E782 Mixed hyperlipidemia: Secondary | ICD-10-CM | POA: Insufficient documentation

## 2014-04-04 DIAGNOSIS — R002 Palpitations: Secondary | ICD-10-CM | POA: Insufficient documentation

## 2014-04-04 DIAGNOSIS — Z7982 Long term (current) use of aspirin: Secondary | ICD-10-CM | POA: Insufficient documentation

## 2014-04-04 DIAGNOSIS — E669 Obesity, unspecified: Secondary | ICD-10-CM | POA: Insufficient documentation

## 2014-04-04 DIAGNOSIS — Z881 Allergy status to other antibiotic agents status: Secondary | ICD-10-CM | POA: Diagnosis not present

## 2014-04-04 DIAGNOSIS — K589 Irritable bowel syndrome without diarrhea: Secondary | ICD-10-CM | POA: Diagnosis present

## 2014-04-04 DIAGNOSIS — R001 Bradycardia, unspecified: Secondary | ICD-10-CM | POA: Diagnosis not present

## 2014-04-04 DIAGNOSIS — R000 Tachycardia, unspecified: Secondary | ICD-10-CM | POA: Diagnosis not present

## 2014-04-04 DIAGNOSIS — R0602 Shortness of breath: Secondary | ICD-10-CM | POA: Diagnosis not present

## 2014-04-04 HISTORY — DX: Paroxysmal atrial fibrillation: I48.0

## 2014-04-04 HISTORY — DX: Personal history of pneumonia (recurrent): Z87.01

## 2014-04-04 LAB — CBC
HCT: 42.7 % (ref 36.0–46.0)
HEMOGLOBIN: 14.8 g/dL (ref 12.0–15.0)
MCH: 33.5 pg (ref 26.0–34.0)
MCHC: 34.7 g/dL (ref 30.0–36.0)
MCV: 96.6 fL (ref 78.0–100.0)
PLATELETS: 184 10*3/uL (ref 150–400)
RBC: 4.42 MIL/uL (ref 3.87–5.11)
RDW: 12.8 % (ref 11.5–15.5)
WBC: 12.1 10*3/uL — AB (ref 4.0–10.5)

## 2014-04-04 LAB — D-DIMER, QUANTITATIVE (NOT AT ARMC): D DIMER QUANT: 0.38 ug{FEU}/mL (ref 0.00–0.48)

## 2014-04-04 LAB — URINALYSIS, ROUTINE W REFLEX MICROSCOPIC
BILIRUBIN URINE: NEGATIVE
Glucose, UA: NEGATIVE mg/dL
HGB URINE DIPSTICK: NEGATIVE
KETONES UR: NEGATIVE mg/dL
NITRITE: NEGATIVE
PH: 7 (ref 5.0–8.0)
PROTEIN: NEGATIVE mg/dL
SPECIFIC GRAVITY, URINE: 1.006 (ref 1.005–1.030)
Urobilinogen, UA: 0.2 mg/dL (ref 0.0–1.0)

## 2014-04-04 LAB — TROPONIN I

## 2014-04-04 LAB — BRAIN NATRIURETIC PEPTIDE: B Natriuretic Peptide: 229.4 pg/mL — ABNORMAL HIGH (ref 0.0–100.0)

## 2014-04-04 LAB — BASIC METABOLIC PANEL
Anion gap: 10 (ref 5–15)
BUN: 13 mg/dL (ref 6–23)
CHLORIDE: 107 meq/L (ref 96–112)
CO2: 23 mmol/L (ref 19–32)
Calcium: 10.1 mg/dL (ref 8.4–10.5)
Creatinine, Ser: 1.12 mg/dL — ABNORMAL HIGH (ref 0.50–1.10)
GFR calc Af Amer: 54 mL/min — ABNORMAL LOW (ref >90)
GFR, EST NON AFRICAN AMERICAN: 47 mL/min — AB (ref >90)
GLUCOSE: 122 mg/dL — AB (ref 70–99)
Potassium: 4.7 mmol/L (ref 3.5–5.1)
Sodium: 140 mmol/L (ref 135–145)

## 2014-04-04 LAB — I-STAT TROPONIN, ED: Troponin i, poc: 0 ng/mL (ref 0.00–0.08)

## 2014-04-04 LAB — URINE MICROSCOPIC-ADD ON

## 2014-04-04 MED ORDER — DICLOFENAC SODIUM 75 MG PO TBEC
75.0000 mg | DELAYED_RELEASE_TABLET | Freq: Every day | ORAL | Status: DC
  Administered 2014-04-04: 75 mg via ORAL
  Filled 2014-04-04 (×2): qty 1

## 2014-04-04 MED ORDER — SUMATRIPTAN SUCCINATE 50 MG PO TABS
50.0000 mg | ORAL_TABLET | ORAL | Status: DC | PRN
  Filled 2014-04-04: qty 1

## 2014-04-04 MED ORDER — PRAVASTATIN SODIUM 40 MG PO TABS
40.0000 mg | ORAL_TABLET | Freq: Every day | ORAL | Status: DC
  Administered 2014-04-04: 40 mg via ORAL
  Filled 2014-04-04 (×2): qty 1

## 2014-04-04 MED ORDER — DICLOFENAC SODIUM 75 MG PO TBEC: 75.0000 mg | DELAYED_RELEASE_TABLET | Freq: Every day | ORAL | Status: DC

## 2014-04-04 MED ORDER — ONDANSETRON HCL 4 MG/2ML IJ SOLN: 4.0000 mg | Freq: Four times a day (QID) | INTRAMUSCULAR | Status: DC | PRN

## 2014-04-04 MED ORDER — ASPIRIN 81 MG PO CHEW
81.0000 mg | CHEWABLE_TABLET | Freq: Every day | ORAL | Status: DC
  Administered 2014-04-04 – 2014-04-05 (×2): 81 mg via ORAL
  Filled 2014-04-04 (×2): qty 1

## 2014-04-04 MED ORDER — LEVOTHYROXINE SODIUM 112 MCG PO TABS
112.0000 ug | ORAL_TABLET | Freq: Every day | ORAL | Status: DC
  Administered 2014-04-05: 112 ug via ORAL
  Filled 2014-04-04 (×2): qty 1

## 2014-04-04 MED ORDER — HEPARIN BOLUS VIA INFUSION
2000.0000 [IU] | Freq: Once | INTRAVENOUS | Status: AC
  Administered 2014-04-04: 2000 [IU] via INTRAVENOUS
  Filled 2014-04-04: qty 2000

## 2014-04-04 MED ORDER — PROPRANOLOL HCL 40 MG PO TABS: 40.0000 mg | ORAL_TABLET | Freq: Two times a day (BID) | ORAL | Status: DC

## 2014-04-04 MED ORDER — PROPRANOLOL HCL 40 MG PO TABS
40.0000 mg | ORAL_TABLET | Freq: Three times a day (TID) | ORAL | Status: DC
  Administered 2014-04-04 – 2014-04-05 (×2): 40 mg via ORAL
  Filled 2014-04-04 (×6): qty 1

## 2014-04-04 MED ORDER — HYDROCODONE-ACETAMINOPHEN 10-325 MG PO TABS
1.0000 | ORAL_TABLET | Freq: Four times a day (QID) | ORAL | Status: DC | PRN
  Administered 2014-04-04: 1 via ORAL
  Filled 2014-04-04: qty 1

## 2014-04-04 MED ORDER — ACETAMINOPHEN 325 MG PO TABS: 650.0000 mg | ORAL_TABLET | ORAL | Status: DC | PRN

## 2014-04-04 MED ORDER — ALPRAZOLAM 0.5 MG PO TABS
0.5000 mg | ORAL_TABLET | Freq: Every day | ORAL | Status: DC
  Administered 2014-04-04: 0.5 mg via ORAL
  Filled 2014-04-04: qty 1

## 2014-04-04 MED ORDER — PANTOPRAZOLE SODIUM 40 MG PO TBEC: 40.0000 mg | DELAYED_RELEASE_TABLET | Freq: Every day | ORAL | Status: DC

## 2014-04-04 MED ORDER — HEPARIN (PORCINE) IN NACL 100-0.45 UNIT/ML-% IJ SOLN
1100.0000 [IU]/h | INTRAMUSCULAR | Status: DC
  Administered 2014-04-04: 1300 [IU]/h via INTRAVENOUS
  Administered 2014-04-05: 1100 [IU]/h via INTRAVENOUS
  Filled 2014-04-04 (×2): qty 250

## 2014-04-04 MED ORDER — LISINOPRIL 10 MG PO TABS
10.0000 mg | ORAL_TABLET | Freq: Every day | ORAL | Status: DC
  Filled 2014-04-04: qty 1

## 2014-04-04 MED ORDER — PYRIDOSTIGMINE BROMIDE 60 MG PO TABS
30.0000 mg | ORAL_TABLET | Freq: Two times a day (BID) | ORAL | Status: DC
  Administered 2014-04-04 – 2014-04-05 (×2): 30 mg via ORAL
  Filled 2014-04-04 (×4): qty 0.5

## 2014-04-04 NOTE — H&P (Addendum)
Primary cardiologist: Dr Kate Sable Consulting cardiologist: Dr Carlyle Dolly  Clinical Summary [name] is a 74 y.o.female hx of HTN, HL, and chest pain with outpatient Lexican ordered by primary cardiologist admitted with chest pain and palpitations. She had checked her heart rate at home and noted to be 148. Palpitations started around 900AM, lasted aapprox 2.5 hours. Reports short milder episodes before but nothing this severe.  BNP 229, K 4.7, Cr 1.12, (at baseline), WBC 12.1, Hgb 14.8, trop neg, UA neg, D-dimer negative,  CXR no acute prcoess EKG afib LAD probable LAFB then SR with LAFB   Allergies  Allergen Reactions  . Macrodantin [Nitrofurantoin Macrocrystal] Nausea And Vomiting  . Clarithromycin   . Codeine     Can not take Codeine unless in cough syrup  . Nitrofurantoin Nausea And Vomiting  . Tetracyclines & Related Nausea And Vomiting  . Ampicillin Rash  . Biaxin [Clarithromycin] Rash    Medications Scheduled Medications:    Infusions:    PRN Medications:     Past Medical History  Diagnosis Date  . Unspecified essential hypertension   . Mixed hyperlipidemia   . Obesity, unspecified   . Esophageal reflux   . Hypertension     over 10 yrs  . Hypothyroidism   . Migraines   . Osteoarthritis of knee     both knees  . IBS (irritable colon syndrome)   . Ocular myasthenia gravis   . Complication of anesthesia     Low blood pressure, heart rate, O2 sat  . Dysrhythmia     occ palpitations   . Heart murmur   . Anxiety   . Shortness of breath     with exertion   . Pneumonia     hx of   . Cancer     hx of skin cancer   . Anemia     Past Surgical History  Procedure Laterality Date  . Knee surgery    . Total knee arthroplasty      both knee. 2005 1st, 2011 the last one  . Abdominal hysterectomy    . Cholecystectomy    . Nasal sinus surgery      30 yrs ago  . Colonoscopy  08/24/2011    Procedure: COLONOSCOPY;  Surgeon: Rogene Houston, MD;  [facility];  Service: Endoscopy;  Laterality: N/A;  1200  . Ct guided biopsy  01/16/12  . Givens capsule study  01/29/2012    Procedure: GIVENS CAPSULE STUDY;  Surgeon: Rogene Houston, MD;  [facility]: [facility];  Service: Endoscopy;  Laterality: N/A;  730  . Cardiac catheterization    . Laparotomy  03/01/2012    Procedure: EXPLORATORY LAPAROTOMY;  Surgeon: Earnstine Regal, MD;  Location: [facility];  Service: General;  Laterality: N/A;  Exploratory Laparotomy ,Resection Retroperitoneal Mass    Family History  Problem Relation Age of Onset  . Inflammatory bowel disease Maternal Grandmother     Social History Ms. Walter reports that she has never smoked. She has never used smokeless tobacco. Ms. Egelhoff reports that she does not drink alcohol.  Review of Systems CONSTITUTIONAL: No weight loss, fever, chills, weakness or fatigue.  HEENT: Eyes: No visual loss, blurred vision, double vision or yellow sclerae. No hearing loss, sneezing, congestion, runny nose or sore throat.  SKIN: No rash or itching.  CARDIOVASCULAR: per HPI RESPIRATORY: No shortness of breath, cough or sputum.  GASTROINTESTINAL: No anorexia, nausea, vomiting or diarrhea. No abdominal pain  or blood.  GENITOURINARY: no polyuria, no dysuria NEUROLOGICAL: No headache, dizziness, syncope, paralysis, ataxia, numbness or tingling in the extremities. No change in bowel or bladder control.  MUSCULOSKELETAL: No muscle, back pain, joint pain or stiffness.  HEMATOLOGIC: No anemia, bleeding or bruising.  LYMPHATICS: No enlarged nodes. No history of splenectomy.  PSYCHIATRIC: No history of depression or anxiety.      Physical Examination Blood pressure 111/55, pulse 47, temperature 98 F (36.7 C), temperature source Oral, resp. rate 18, SpO2 96 %. No intake or output data in the 24 hours ending 04/04/14 1547  HEENT: sclera clear  Cardiovascular: RRR, no m/r/g, no JVD, no carotid bruits  Respiratory:  CTAB  GI: abdomen soft, NT, ND  MSK: no LE edema  Neuro: no focal deficits  Psych: appropriate affect   Lab Results  Basic Metabolic Panel:  Recent Labs Lab 04/04/14 1155  NA 140  K 4.7  CL 107  CO2 23  GLUCOSE 122*  BUN 13  CREATININE 1.12*  CALCIUM 10.1    Liver Function Tests: No results for input(s): AST, ALT, ALKPHOS, BILITOT, PROT, ALBUMIN in the last 168 hours.  CBC:  Recent Labs Lab 04/04/14 1155  WBC 12.1*  HGB 14.8  HCT 42.7  MCV 96.6  PLT 184    Cardiac Enzymes: No results for input(s): CKTOTAL, CKMB, CKMBINDEX, TROPONINI in the last 168 hours.  BNP: Invalid input(s): POCBNP     Impression/Recommendations 1. Afib - new diagnosis for patient, short episode in ER captured by EKG - she has been on inderal for her migraine headaches with signficant benefits, recently decreased at last clinic visit for bradycardia with rates in 40s to propanolol 40mg  bid. She is hesitant to change. Will continue her inderal, but increase back to tid for now - CHADS2Vasc score is 60 (age, HTN, gender), she is hesitant for anticoagulation at this time so will not start. She is to discuss with family. Continue ASA  2. HTN - follow bp on inderal and lisionpril  3. Paraganglioma: followed by oncology Dr Barnet Glasgow. Previous resection 03/01/2012 - she reports episodes of palpitations and tachycardia, unclear if this could be related. Has close f/u with Dr Barnet Glasgow as outpatient.   4. Chest pain - atypical, she reports better with more aggressive antacid therapy - has outpatient stress ordered, don't see strong indication to change to inpatient - cycle enzymes and EKGs.   Carlyle Dolly, M.D.

## 2014-04-04 NOTE — Telephone Encounter (Signed)
Patient with hx of HTN.  Called today. This AM noted HR 62 BP 160/80 Took meds. Then, she had a sudden onset of palpitations.  HR feels irregular and fast 130-140s and BP 120/60. Symptoms: + SOB talking on the phone but she denies being short of breath. No CP. + off balance. No syncope.   No edema. Patient gave me permission to speak with daughter Charisse Klinefelter).    I advised the patient to go to the closest emergency room which is either [facility] hospital or [facility]. However, the patient is insistent upon going to [facility]. I advised the patient and her daughter that it is in her best interest to go the closest emergency room for further evaluation. Her daughter assured me that she will talk to the patient about going to the closest emergency room. She knows to call 911  if her situation deteriorates.  Richardson Dopp, PA-C   04/04/2014 11:04 AM

## 2014-04-04 NOTE — ED Notes (Signed)
She woke up this morning and felt normal, took her and meds, then she began to feel her heart race, some substernal CP and unable to catch her breath. 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 She is aA&ox4 now, she is mildly labred breathing while speaking sentences in triage

## 2014-04-04 NOTE — Progress Notes (Signed)
ANTICOAGULATION CONSULT NOTE - Initial Consult  Pharmacy Consult for Heparin Indication: chest pain/ACS  Allergies  Allergen Reactions  . Macrodantin [Nitrofurantoin Macrocrystal] Nausea And Vomiting  . Clarithromycin   . Codeine     Can not take Codeine unless in cough syrup  . Nitrofurantoin Nausea And Vomiting  . Tetracyclines & Related Nausea And Vomiting  . Ampicillin Rash  . Biaxin [Clarithromycin] Rash    Patient Measurements:   87 kg  Vital Signs: Temp: 98 F (36.7 C) (01/16 1150) Temp Source: Oral (01/16 1150) BP: 141/60 mmHg (01/16 1600) Pulse Rate: 46 (01/16 1600)  Labs:  Recent Labs  04/04/14 1155  HGB 14.8  HCT 42.7  PLT 184  CREATININE 1.12*    Estimated Creatinine Clearance: 46.4 mL/min (by C-G formula based on Cr of 1.12).   Medical History: Past Medical History  Diagnosis Date  . Unspecified essential hypertension   . Mixed hyperlipidemia   . Obesity, unspecified   . Esophageal reflux   . Hypertension     over 10 yrs  . Hypothyroidism   . Migraines   . Osteoarthritis of knee     both knees  . IBS (irritable colon syndrome)   . Ocular myasthenia gravis   . Complication of anesthesia     Low blood pressure, heart rate, O2 sat  . Dysrhythmia     occ palpitations   . Heart murmur   . Anxiety   . Shortness of breath     with exertion   . Pneumonia     hx of   . Cancer     hx of skin cancer   . Anemia     Medications:   (Not in a hospital admission)  Assessment: 74 yo F admitted 04/04/2014 with chest pain and palpitations in afib.  Pharmacy consulted to dose heparin.  PMH: HTN, HLP  CV: New onset afib, CHADS2VASC 4  Goal of Therapy:  Heparin level 0.3-0.7 units/ml Monitor platelets by anticoagulation protocol: Yes   Plan:  Give 2000 units bolus x 1 Start heparin infusion at 1300 units/hr Check anti-Xa level in 8 hours and daily while on heparin Continue to monitor H&H and platelets  Thank you for allowing pharmacy  to be a part of this patients care team.  Rowe Robert Pharm.D., BCPS, AQ-Cardiology Clinical Pharmacist 04/04/2014 5:11 PM Pager: [phone] Phone: [phone]

## 2014-04-04 NOTE — ED Provider Notes (Signed)
CSN: [id number]     Arrival date & time 04/04/14  1141 History   First MD Initiated Contact with Patient 04/04/14 1204     Chief Complaint  Patient presents with  . Chest Pain      The history is provided by the patient. No language interpreter was used.   Ms. Stefanik presents for evaluation of palpitations.  She has been increasingly fatigued lately.  She was started on blood pressure medications by her cardiologist for hypertension three days ago.  Today she developed palpitations at home with associated shortness of breath.  When she checked her heart rate at home it was 148.  Her palpitations resolved after ED arrival, but now she has some central chest tightness.  Sxs are moderate, intermittent, improving.    Past Medical History  Diagnosis Date  . Unspecified essential hypertension   . Mixed hyperlipidemia   . Obesity, unspecified   . Esophageal reflux   . Hypertension     over 10 yrs  . Hypothyroidism   . Migraines   . Osteoarthritis of knee     both knees  . IBS (irritable colon syndrome)   . Ocular myasthenia gravis   . Complication of anesthesia     Low blood pressure, heart rate, O2 sat  . Dysrhythmia     occ palpitations   . Heart murmur   . Anxiety   . Shortness of breath     with exertion   . Pneumonia     hx of   . Cancer     hx of skin cancer   . Anemia    Past Surgical History  Procedure Laterality Date  . Knee surgery    . Total knee arthroplasty      both knee. 2005 1st, 2011 the last one  . Abdominal hysterectomy    . Cholecystectomy    . Nasal sinus surgery      30 yrs ago  . Colonoscopy  08/24/2011    Procedure: COLONOSCOPY;  Surgeon: Rogene Houston, MD;  Location: [facility];  Service: Endoscopy;  Laterality: N/A;  1200  . Ct guided biopsy  01/16/12  . Givens capsule study  01/29/2012    Procedure: GIVENS CAPSULE STUDY;  Surgeon: Rogene Houston, MD;  Location: [facility];  Service: Endoscopy;  Laterality: N/A;  730  . Cardiac  catheterization    . Laparotomy  03/01/2012    Procedure: EXPLORATORY LAPAROTOMY;  Surgeon: Earnstine Regal, MD;  Location: [facility];  Service: General;  Laterality: N/A;  Exploratory Laparotomy ,Resection Retroperitoneal Mass   Family History  Problem Relation Age of Onset  . Inflammatory bowel disease Maternal Grandmother    History  Substance Use Topics  . Smoking status: Never Smoker   . Smokeless tobacco: Never Used  . Alcohol Use: No   OB History    No data available     Review of Systems  All other systems reviewed and are negative.     Allergies  Macrodantin; Clarithromycin; Codeine; Nitrofurantoin; Tetracyclines & related; Ampicillin; and Biaxin  Home Medications   Prior to Admission medications   Medication Sig Start Date End Date Taking? Authorizing Provider  ALPRAZolam Duanne Moron) 1 MG tablet Take 1 mg by mouth at bedtime as needed. HS as needed and can be repeated x 1 if needed. insomnia    Historical Provider, MD  aspirin 81 MG tablet Take 81 mg by mouth daily. Patient states that she is taking this every other  day    Historical Provider, MD  Calcium Carbonate-Vit D-Min (CALTRATE 600+D PLUS MINERALS PO) Take 1 tablet by mouth 2 (two) times daily.     Historical Provider, MD  diclofenac (VOLTAREN) 50 MG EC tablet Take 75 mg by mouth daily.     Historical Provider, MD  HYDROcodone-acetaminophen (NORCO) 10-325 MG per tablet Take 1 tablet by mouth every 6 (six) hours as needed. pain    Historical Provider, MD  lansoprazole (PREVACID) 30 MG capsule TAKE ONE CAPSULE BY MOUTH TWICE A DAY 01/12/14   Butch Penny, NP  levothyroxine (SYNTHROID, LEVOTHROID) 112 MCG tablet Take 112 mcg by mouth daily before breakfast.     Historical Provider, MD  lidocaine (LIDODERM) 5 % Place 1 patch onto the skin daily as needed. Remove & Discard patch within 12 hours or as directed by MD for pain    Historical Provider, MD  lisinopril (PRINIVIL,ZESTRIL) 10 MG tablet Take 1 tablet (10 mg total)  by mouth daily. 04/01/14   Imogene Burn, PA-C  Magnesium Hydroxide (MAGNESIA PO) Take 250 mg by mouth 2 (two) times daily.     Historical Provider, MD  Multiple Vitamin (MULTIVITAMIN WITH MINERALS) TABS Take 1 tablet by mouth daily.    Historical Provider, MD  NON FORMULARY Nail clear from dermatologist And probiotic    Historical Provider, MD  Omega-3 Fatty Acids (FISH OIL) 1200 MG CAPS Take 1,200 mg by mouth daily.     Historical Provider, MD  pravastatin (PRAVACHOL) 80 MG tablet Take 40 mg by mouth at bedtime.     Historical Provider, MD  propranolol (INDERAL) 40 MG tablet Take 1 tablet (40 mg total) by mouth 2 (two) times daily. 04/01/14   Imogene Burn, PA-C  pyridostigmine (MESTINON) 60 MG tablet TAKE 0.5 TABLETS (30 MG TOTAL) BY MOUTH 3 (THREE) TIMES DAILY. 07/28/13   Kathrynn Ducking, MD  SUMAtriptan (IMITREX) 50 MG tablet Take 50 mg by mouth every 2 (two) hours as needed. For migraines    Historical Provider, MD  triamcinolone ointment (KENALOG) 0.5 % Apply 1 application topically 2 (two) times daily as needed. Rash 10/11/11   Historical Provider, MD   BP 107/49 mmHg  Pulse 49  Temp(Src) 98 F (36.7 C) (Oral)  Resp 13  SpO2 98% Physical Exam  Constitutional: She is oriented to person, place, and time. She appears well-developed and well-nourished.  HENT:  Head: Normocephalic and atraumatic.  Cardiovascular: Normal rate and regular rhythm.   No murmur heard. Pulmonary/Chest: Effort normal and breath sounds normal. No respiratory distress.  Abdominal: Soft. There is no tenderness. There is no rebound and no guarding.  Musculoskeletal: She exhibits no edema or tenderness.  Neurological: She is alert and oriented to person, place, and time.  Skin: Skin is warm and dry.  Psychiatric: She has a normal mood and affect. Her behavior is normal.  Nursing note and vitals reviewed.   ED Course  Procedures (including critical care time) Labs Review Labs Reviewed  CBC - Abnormal;  Notable for the following:    WBC 12.1 (*)    All other components within normal limits  BASIC METABOLIC PANEL - Abnormal; Notable for the following:    Glucose, Bld 122 (*)    Creatinine, Ser 1.12 (*)    GFR calc non Af Amer 47 (*)    GFR calc Af Amer 54 (*)    All other components within normal limits  BRAIN NATRIURETIC PEPTIDE - Abnormal; Notable for the following:  B Natriuretic Peptide 229.4 (*)    All other components within normal limits  URINALYSIS, ROUTINE W REFLEX MICROSCOPIC - Abnormal; Notable for the following:    Leukocytes, UA TRACE (*)    All other components within normal limits  D-DIMER, QUANTITATIVE  URINE MICROSCOPIC-ADD ON  TROPONIN I  TROPONIN I  TROPONIN I  I-STAT TROPOININ, ED    Imaging Review Dg Chest 2 View  04/04/2014   CLINICAL DATA:  Acute onset of tachycardia and substernal chest pain radiating into the left neck associated with shortness of breath after taking her usual medications earlier today. Prior history of low-grade neuroendocrine neoplasm of the retroperitoneum.  EXAM: CHEST  2 VIEW  COMPARISON:  12/20/2011 dating back to 01/01/2012. CT chest 01/13/2013. PET-CT 01/10/2012. A loop in it  FINDINGS: Cardiomediastinal silhouette unremarkable, unchanged. Lungs clear. Bronchovascular markings normal. Pulmonary vascularity normal. No visible pleural effusions. No pneumothorax. Stable chronic elevation of the right hemidiaphragm. Degenerative changes involving the thoracic spine and visualized upper lumbar spine, unchanged.  IMPRESSION: No acute cardiopulmonary disease.  Stable examination.   Electronically Signed   By: Evangeline Dakin M.D.   On: 04/04/2014 13:33     EKG Interpretation   Date/Time:  Saturday April 04 2014 11:46:41 EST Ventricular Rate:  141 PR Interval:    QRS Duration: 110 QT Interval:  318 QTC Calculation: 487 R Axis:   -53 Text Interpretation:  Atrial fibrillation with rapid ventricular response  Left anterior fascicular  block ST \\T \ T wave abnormality, consider lateral  ischemia Abnormal ECG Confirmed by Hazle Coca [phone]) on 04/04/2014 1:18:16  PM      MDM   Final diagnoses:  Paroxysmal atrial fibrillation  Chest pain, unspecified chest pain type   Pt presented with palpitations, in afib with RVR, cardioverted spontaneously prior to evaluation by ED physician.   Discussed patient with Dr. branch with cardiology, he will come to see the patient. Clinical picture is not consistent with ACS, PE, acute symptomatic congestive heart failure.     Quintella Reichert, MD 04/04/14 [phone]

## 2014-04-05 ENCOUNTER — Other Ambulatory Visit: Payer: Self-pay | Admitting: Physician Assistant

## 2014-04-05 ENCOUNTER — Encounter (HOSPITAL_COMMUNITY): Payer: Self-pay | Admitting: Physician Assistant

## 2014-04-05 DIAGNOSIS — I480 Paroxysmal atrial fibrillation: Secondary | ICD-10-CM

## 2014-04-05 DIAGNOSIS — N179 Acute kidney failure, unspecified: Secondary | ICD-10-CM

## 2014-04-05 LAB — CBC
HCT: 36.5 % (ref 36.0–46.0)
Hemoglobin: 12.1 g/dL (ref 12.0–15.0)
MCH: 32.2 pg (ref 26.0–34.0)
MCHC: 33.2 g/dL (ref 30.0–36.0)
MCV: 97.1 fL (ref 78.0–100.0)
PLATELETS: 141 10*3/uL — AB (ref 150–400)
RBC: 3.76 MIL/uL — ABNORMAL LOW (ref 3.87–5.11)
RDW: 13.1 % (ref 11.5–15.5)
WBC: 7 10*3/uL (ref 4.0–10.5)

## 2014-04-05 LAB — BASIC METABOLIC PANEL
ANION GAP: 4 — AB (ref 5–15)
BUN: 13 mg/dL (ref 6–23)
CO2: 27 mmol/L (ref 19–32)
CREATININE: 1.4 mg/dL — AB (ref 0.50–1.10)
Calcium: 9.2 mg/dL (ref 8.4–10.5)
Chloride: 108 mEq/L (ref 96–112)
GFR calc non Af Amer: 36 mL/min — ABNORMAL LOW (ref >90)
GFR, EST AFRICAN AMERICAN: 41 mL/min — AB (ref >90)
Glucose, Bld: 102 mg/dL — ABNORMAL HIGH (ref 70–99)
Potassium: 3.8 mmol/L (ref 3.5–5.1)
SODIUM: 139 mmol/L (ref 135–145)

## 2014-04-05 LAB — TROPONIN I: Troponin I: <0.03 ng/mL (ref <0.031)

## 2014-04-05 LAB — HEPARIN LEVEL (UNFRACTIONATED)
Heparin Unfractionated: 0.92 IU/mL — ABNORMAL HIGH (ref 0.30–0.70)
Heparin Unfractionated: 0.99 IU/mL — ABNORMAL HIGH (ref 0.30–0.70)

## 2014-04-05 LAB — MAGNESIUM: Magnesium: 1.8 mg/dL (ref 1.5–2.5)

## 2014-04-05 LAB — TSH: TSH: 0.63 u[IU]/mL (ref 0.350–4.500)

## 2014-04-05 MED ORDER — PROPRANOLOL HCL 40 MG PO TABS: 40.0000 mg | ORAL_TABLET | Freq: Three times a day (TID) | ORAL | Status: DC

## 2014-04-05 MED ORDER — APIXABAN 5 MG PO TABS
5.0000 mg | ORAL_TABLET | Freq: Two times a day (BID) | ORAL | Status: DC
  Administered 2014-04-05: 5 mg via ORAL
  Filled 2014-04-05 (×2): qty 1

## 2014-04-05 MED ORDER — ACETAMINOPHEN 325 MG PO TABS: 650.0000 mg | ORAL_TABLET | ORAL | Status: DC | PRN

## 2014-04-05 MED ORDER — APIXABAN 5 MG PO TABS: 5.0000 mg | ORAL_TABLET | Freq: Two times a day (BID) | ORAL | Status: DC

## 2014-04-05 MED ORDER — TRAMADOL HCL 50 MG PO TABS
50.0000 mg | ORAL_TABLET | Freq: Two times a day (BID) | ORAL | Status: DC | PRN
Start: 2014-04-05 — End: 2014-04-05

## 2014-04-05 MED ORDER — TRAMADOL HCL 50 MG PO TABS: 50.0000 mg | ORAL_TABLET | Freq: Two times a day (BID) | ORAL | Status: DC | PRN

## 2014-04-05 NOTE — Addendum Note (Signed)
Addended byKathlen Mody, Nicki Reaper T on: 04/05/2014 12:59 PM   Modules accepted: Orders

## 2014-04-05 NOTE — Discharge Summary (Signed)
Discharge Summary   Patient ID: [name], MRN: [id number], DOB/AGE: 76-19-1942 76 y.o.  Admit date: 04/04/2014 Discharge date: 04/05/2014   Primary Care Physician:  [facility]   Primary Cardiologist:  Dr. Kate Sable    Reason for Admission:  Atrial fibrillation with RVR   Primary Discharge Diagnoses:  Principal Problem:   Paroxysmal atrial fibrillation Active Problems:   Hypertension   IBS (irritable bowel syndrome)   Chest pain   Sinus bradycardia   AKI (acute kidney injury)     Wt Readings from Last 3 Encounters:  04/05/14 191 lb (86.637 kg)  04/01/14 192 lb (87.091 kg)  01/26/14 199 lb 3.2 oz (90.357 kg)    Secondary Discharge Diagnoses:   Past Medical History  Diagnosis Date  . Unspecified essential hypertension   . Mixed hyperlipidemia   . Obesity, unspecified   . Esophageal reflux   . Hypertension     over 10 yrs  . Hypothyroidism   . Migraines   . Osteoarthritis of knee     both knees  . IBS (irritable colon syndrome)   . Ocular myasthenia gravis   . Complication of anesthesia     Low blood pressure, heart rate, O2 sat  . PAF (paroxysmal atrial fibrillation)   . Anxiety   . History of pneumonia     hx of   . Cancer     hx of skin cancer   . Anemia       Allergies:    Allergies  Allergen Reactions  . Avocado Diarrhea and Nausea And Vomiting  . Macrodantin [Nitrofurantoin Macrocrystal] Nausea And Vomiting  . Tetracyclines & Related Nausea And Vomiting  . Ampicillin Rash  . Biaxin [Clarithromycin] Rash  . Codeine Itching    Can not take Codeine unless in cough syrup      Procedures Performed This Admission:   None   Hospital Course:  [name] is a 74 y.o. female with a history of hypertension, hyperlipidemia, paraganglioma s/p resection 02/2012 and recent complaints of chest pain. She was seen in our office in [place] recently and placed on lisinopril for blood pressure and set up for an outpatient stress test. She  called the answering service yesterday with complaints of palpitations and chest discomfort. She was advised to come to the emergency room. When she arrived, she was found to be in atrial fibrillation with a heart rate of 148. She converted spontaneously to normal sinus rhythm. She was admitted and placed on heparin. Serial cardiac markers were obtained. These remained negative. She did complain of constipation as well as some blood-tinged mucus in her stool. She is followed closely by gastroenterology and has had a recent colonoscopy. She denies any history of significant GI bleeding. there was significant stool burden on abdominal xray.  She has a CHADS2-VASc=4.  She would benefit from anticoagulation therapy. Eliquis was initiated. Echocardiogram could not be done today and will be arranged as an outpatient. Creatinine increased from yesterday (1.12>>1.4).  Lisinopril will be held.  A BMET will be obtained early next week.  She will monitor her BP.  If her BP starts to increase, will need to consider resuming her ACEI vs initiating an alternative agent.  She will keep her outpatient stress test next week. She will follow-up with Dr. Kate Sable  as planned. She was seen by Dr. Harl Bowie this morning and felt to be stable for discharge to home.      Discharge Vitals:   Blood pressure  100/63, pulse 43, temperature 97.7 F (36.5 C), temperature source Oral, resp. rate 16, weight 191 lb (86.637 kg), SpO2 96 %.   Labs:   Recent Labs  04/04/14 1155 04/05/14 0431  WBC 12.1* 7.0  HGB 14.8 12.1  HCT 42.7 36.5  MCV 96.6 97.1  PLT 184 141*     Recent Labs  04/04/14 1155 04/05/14 0431  NA 140 139  K 4.7 3.8  CL 107 108  CO2 23 27  BUN 13 13  CREATININE 1.12* 1.40*  CALCIUM 10.1 9.2     Recent Labs  04/04/14 1922 04/04/14 2240 04/05/14 0431  TROPONINI <0.03 <0.03 <0.03    Lab Results  Component Value Date   DDIMER 0.38 04/04/2014    Lab Results  Component Value Date   TSH  0.630 04/05/2014    Diagnostic Procedures and Studies:  Dg Chest 2 View   04/04/2014    IMPRESSION: No acute cardiopulmonary disease.  Stable examination.   Electronically Signed   By: Evangeline Dakin M.D.   On: 04/04/2014 13:33   Dg Abd 1 View  04/04/2014    IMPRESSION: No acute abdominal abnormality.  Moderately large stool burden.   Electronically Signed   By: Evangeline Dakin M.D.   On: 04/04/2014 17:56    Disposition:   Pt is being discharged home today in good condition.  Follow-up Plans & Appointments      Follow-up Information    Follow up with [facility] On 04/08/2014.   Why:  Proceed with stress test as planned   Contact information:   [address] [phone]      Follow up with Herminio Commons, MD On 04/16/2014.   Specialty:  Cardiology   Why:  11:40 AM   Contact information:   [place] [address] [phone]       Follow up with [facility] In 3 days.   Specialty:  Cardiology   Why:  You need follow-up lab test (BMET). The office will call you. Keep an eye on your blood pressure and call us if it runs high.   Contact information:   [place] [phone]      Follow up with Delphina Cahill, MD.   Specialty:  Internal Medicine   Why:  As planned   Contact information:    [place] [address] [phone]       Discharge Medications    Medication List    STOP taking these medications        aspirin 81 MG tablet     diclofenac 75 MG EC tablet  Commonly known as:  VOLTAREN     lisinopril 10 MG tablet  Commonly known as:  PRINIVIL,ZESTRIL      TAKE these medications        acetaminophen 325 MG tablet  Commonly known as:  TYLENOL  Take 2 tablets (650 mg total) by mouth every 4 (four) hours as needed for headache or mild pain. Do not take with Norco (hydrocodone/APAP)     ALPRAZolam 1 MG tablet  Commonly  known as:  XANAX  Take 1 mg by mouth at bedtime as needed for sleep. HS as needed and can be repeated x 1 if needed. insomnia     amitriptyline 25 MG tablet  Commonly known as:  ELAVIL  Take 25 mg by mouth at bedtime.  apixaban 5 MG Tabs tablet  Commonly known as:  ELIQUIS  Take 1 tablet (5 mg total) by mouth 2 (two) times daily.     CALTRATE 600+D PLUS MINERALS PO  Take 1 tablet by mouth 2 (two) times daily.     Fish Oil 1200 MG Caps  Take 1,200 mg by mouth daily.     HYDROcodone-acetaminophen 10-325 MG per tablet  Commonly known as:  NORCO  Take 1 tablet by mouth every 6 (six) hours as needed. pain     lansoprazole 30 MG capsule  Commonly known as:  PREVACID  TAKE ONE CAPSULE BY MOUTH TWICE A DAY     levothyroxine 112 MCG tablet  Commonly known as:  SYNTHROID, LEVOTHROID  Take 112 mcg by mouth daily before breakfast.     lidocaine 5 %  Commonly known as:  LIDODERM  Place 1 patch onto the skin daily as needed. Remove & Discard patch within 12 hours or as directed by MD for pain     Magnesium 250 MG Tabs  Take 250 mg by mouth daily. Takes up to twice daily as needed for muscle cramps     multivitamin with minerals Tabs tablet  Take 1 tablet by mouth daily.     NON FORMULARY  Apply 1 application topically 2 (two) times daily. Nail clear from dermatologist     ondansetron 4 MG tablet  Commonly known as:  ZOFRAN  Take 4 mg by mouth daily as needed.     pravastatin 80 MG tablet  Commonly known as:  PRAVACHOL  Take 40 mg by mouth at bedtime.     propranolol 40 MG tablet  Commonly known as:  INDERAL  Take 1 tablet (40 mg total) by mouth 3 (three) times daily.     pyridostigmine 60 MG tablet  Commonly known as:  MESTINON  TAKE 0.5 TABLETS (30 MG TOTAL) BY MOUTH 3 (THREE) TIMES DAILY.     SUMAtriptan 50 MG tablet  Commonly known as:  IMITREX  Take 50 mg by mouth every 2 (two) hours as needed. For migraines     traMADol 50 MG tablet  Commonly known as:  ULTRAM   Take 1-2 tablets (50-100 mg total) by mouth every 12 (twelve) hours as needed for moderate pain.         Outstanding Labs/Studies  1. BMET on 1/18 or 1/19 in [place]. 2. Echocardiogram - will be done as an outpatient 3. Consider follow up BMET and CBC in 4 weeks (on Eliquis).   4. Nuclear Stress test is pending (04/08/14)   Duration of Discharge Encounter: Greater than 30 minutes including physician and PA time.  Signed, Richardson Dopp, PA-C   04/05/2014 1:47 PM

## 2014-04-05 NOTE — Progress Notes (Signed)
Utilization review completed.  

## 2014-04-05 NOTE — Progress Notes (Signed)
04/05/2014 9:15 AM Nursing note PT. BP 100/63 HR 43. Pt. Asymptomatic, ambulating in room independently and states that her HR is normally between 40-50 bpm. Denies dizziness.  Insisting on taking her scheduled propranolol and Lisinopril. Advised patient of given parameters for propranolol. Pt. Verbalized understanding. Richardson Dopp [facility] paged and made aware of findings. Verbal order received to hold both medications until further evaluated by rounding MD. Pt. Updated on plan of care. Verbalized understanding. Will continue to monitor patient.  Marvelle Caudill, [place] Lime

## 2014-04-05 NOTE — Progress Notes (Signed)
-  No show-  KEFALAS,THOMAS  

## 2014-04-05 NOTE — Progress Notes (Signed)
04/05/2014 4:22 PM D/c avs form, medications already taken today and those due this evening given and explained to patient. Follow up appointments and when to call MD reviewed. RX reviewed. D/c iv. D/c tele. D/c home per orders.

## 2014-04-05 NOTE — Assessment & Plan Note (Signed)
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  Secondary to clinical issues, I will set her up for repeat staging scans with CT CAP with contrast.  If negative work-up will consider seeing the patient annually per NCCN guidelines.  Return in 2-3 weeks following CT imaging.

## 2014-04-05 NOTE — Progress Notes (Signed)
ANTICOAGULATION CONSULT NOTE -Follow Up Consult  Pharmacy Consult for Heparin Indication: chest pain/ACS  Allergies  Allergen Reactions  . Avocado Diarrhea and Nausea And Vomiting  . Macrodantin [Nitrofurantoin Macrocrystal] Nausea And Vomiting  . Tetracyclines & Related Nausea And Vomiting  . Ampicillin Rash  . Biaxin [Clarithromycin] Rash  . Codeine Itching    Can not take Codeine unless in cough syrup    Patient Measurements: Weight: 191 lb (86.637 kg) 87 kg  Vital Signs: Temp: 97.7 F (36.5 C) (01/17 0441) Temp Source: Oral (01/17 0441) BP: 100/63 mmHg (01/17 0900) Pulse Rate: 43 (01/17 0900)  Labs:  Recent Labs  04/04/14 1155 04/04/14 1922 04/04/14 2240 04/05/14 0254 04/05/14 0431 04/05/14 1218  HGB 14.8  --   --   --  12.1  --   HCT 42.7  --   --   --  36.5  --   PLT 184  --   --   --  141*  --   HEPARINUNFRC  --   --   --  0.92*  --  0.99*  CREATININE 1.12*  --   --   --  1.40*  --   TROPONINI  --  <0.03 <0.03  --  <0.03  --     Estimated Creatinine Clearance: 37 mL/min (by C-G formula based on Cr of 1.4).   Medical History: Past Medical History  Diagnosis Date  . Unspecified essential hypertension   . Mixed hyperlipidemia   . Obesity, unspecified   . Esophageal reflux   . Hypertension     over 10 yrs  . Hypothyroidism   . Migraines   . Osteoarthritis of knee     both knees  . IBS (irritable colon syndrome)   . Ocular myasthenia gravis   . Complication of anesthesia     Low blood pressure, heart rate, O2 sat  . PAF (paroxysmal atrial fibrillation)   . Anxiety   . History of pneumonia     hx of   . Cancer     hx of skin cancer   . Anemia     Medications:  Prescriptions prior to admission  Medication Sig Dispense Refill Last Dose  . ALPRAZolam (XANAX) 1 MG tablet Take 1 mg by mouth at bedtime as needed for sleep. HS as needed and can be repeated x 1 if needed. insomnia   04/04/2014 at Unknown time  . amitriptyline (ELAVIL) 25 MG tablet  Take 25 mg by mouth at bedtime.   04/04/2014 at Unknown time  . aspirin 81 MG tablet Take 81 mg by mouth daily. Patient states that she is taking this every other day   Past Week at Unknown time  . Calcium Carbonate-Vit D-Min (CALTRATE 600+D PLUS MINERALS PO) Take 1 tablet by mouth 2 (two) times daily.    Past Week at Unknown time  . diclofenac (VOLTAREN) 75 MG EC tablet Take 75 mg by mouth 2 (two) times daily.   Past Week at Unknown time  . HYDROcodone-acetaminophen (NORCO) 10-325 MG per tablet Take 1 tablet by mouth every 6 (six) hours as needed. pain   04/04/2014 at Unknown time  . lansoprazole (PREVACID) 30 MG capsule TAKE ONE CAPSULE BY MOUTH TWICE A DAY 60 capsule 5 Past Week at Unknown time  . levothyroxine (SYNTHROID, LEVOTHROID) 112 MCG tablet Take 112 mcg by mouth daily before breakfast.    04/04/2014 at Unknown time  . lidocaine (LIDODERM) 5 % Place 1 patch onto the skin daily as needed.  Remove & Discard patch within 12 hours or as directed by MD for pain   04/04/2014 at Unknown time  . lisinopril (PRINIVIL,ZESTRIL) 10 MG tablet Take 1 tablet (10 mg total) by mouth daily. 90 tablet 3 04/04/2014 at Unknown time  . Magnesium 250 MG TABS Take 250 mg by mouth daily. Takes up to twice daily as needed for muscle cramps   Past Week at Unknown time  . Multiple Vitamin (MULTIVITAMIN WITH MINERALS) TABS Take 1 tablet by mouth daily.   Past Week at Unknown time  . NON FORMULARY Apply 1 application topically 2 (two) times daily. Nail clear from dermatologist   04/04/2014 at Unknown time  . Omega-3 Fatty Acids (FISH OIL) 1200 MG CAPS Take 1,200 mg by mouth daily.    Past Week at Unknown time  . ondansetron (ZOFRAN) 4 MG tablet Take 4 mg by mouth daily as needed.     . pravastatin (PRAVACHOL) 80 MG tablet Take 40 mg by mouth at bedtime.    Past Week at Unknown time  . propranolol (INDERAL) 40 MG tablet Take 1 tablet (40 mg total) by mouth 2 (two) times daily. 60 tablet 11 04/04/2014 at 0800  . pyridostigmine  (MESTINON) 60 MG tablet TAKE 0.5 TABLETS (30 MG TOTAL) BY MOUTH 3 (THREE) TIMES DAILY. 45 tablet 0 04/04/2014 at Unknown time  . SUMAtriptan (IMITREX) 50 MG tablet Take 50 mg by mouth every 2 (two) hours as needed. For migraines   not used    Assessment: 74 yo F admitted 04/04/2014 with chest pain and palpitations in afib.  Pharmacy consulted to dose heparin.  PMH: HTN, HLP  CV: New onset afib, CHADS2VASC 4, heparin level high on 1100 units/hr, now plan to switch to apixaban.  Will DC heparin and start apixaban 5 mg PO BID  Goal of Therapy:  Xa inhibition Monitor platelets by anticoagulation protocol: Yes   Plan:  1. Apixaban 5 mg PO BID 2. DC heparin 3. DC Voltaren, trial of tramadol for pain  Thank you for allowing pharmacy to be a part of this patients care team.  Rowe Robert Pharm.D., BCPS, AQ-Cardiology Clinical Pharmacist 04/05/2014 1:25 PM Pager: [phone] Phone: [phone]

## 2014-04-05 NOTE — Care Management Note (Signed)
    Page 1 of 1   04/05/2014     4:35:20 PM CARE MANAGEMENT NOTE 04/05/2014  Patient:  [name], [name]   Account Number:  [id number]  Date Initiated:  04/05/2014  Documentation initiated by:  [facility]  Subjective/Objective Assessment:   adm: Atrial fibrillation with RVR     Action/Plan:   discharge planning   Anticipated DC Date:  04/05/2014   Anticipated DC Plan:  [place]  CM consult  Medication Assistance      Choice offered to / List presented to:             Status of service:  Completed, signed off Medicare Important Message given?   (If response is "NO", the following Medicare IM given date fields will be blank) Date Medicare IM given:   Medicare IM given by:   Date Additional Medicare IM given:   Additional Medicare IM given by:    Discharge Disposition:  HOME/SELF CARE  Per UR Regulation:    If discussed at Long Length of Stay Meetings, dates discussed:    Comments:  04/05/14 16:00 CM met with pt and daughter in room and gave pt Free 30 day trial card for Eliquis.  daughter states she is Economist and will activate card for her mother. Pt verbalized understanding the 30 days will give her PCP office staff enough time to have the medication authorized by the insurance to cover refills.  No other Cm needs were communicated.

## 2014-04-05 NOTE — Progress Notes (Signed)
04/05/2014 3:24 PM Nursing note Spoke with sonographer in regards to need for 2 D echo prior to discharge. Earliest time this would be able to be completed would be tomorrow. Pt. States she does not wish to wait until tomorrow and would like this done as out patient. Richardson Dopp [facility] paged and made aware. Case management paged and made aware of Elaquis at discharge to confirm coverage.  Darcel Zick, Arville Lime

## 2014-04-05 NOTE — Progress Notes (Signed)
Primary cardiologist:  Dr. Kate Sable    Subjective:    [name] is a 74 y.o. female who was admitted yesterday with paroxysmal atrial fibrillation with a heart rate in the 140s. She spontaneously converted to NSR on her own. This morning she is feeling well without further chest discomfort or palpitations. She denies dizziness or near syncope. She denies significant dyspnea. She does note a history of recent blood-tinged mucus in her stools. Abdominal x-ray does demonstrate significant stool burden in her colon. She's had episodes like this in the past and tells me that she had a colonoscopy without significant findings in the recent past.  She sees Dr. Laural Golden in [place].    CHADS2-VASc=4 (age 56, female, HTN).   Objective:   Temp:  [97.6 F (36.4 C)-98 F (36.7 C)] 97.7 F (36.5 C) (01/17 0441) Pulse Rate:  [43-70] 43 (01/17 0900) Resp:  [12-18] 16 (01/17 0441) BP: (96-153)/(41-83) 100/63 mmHg (01/17 0900) SpO2:  [93 %-100 %] 96 % (01/17 0441) FiO2 (%):  [21 %] 21 % (01/16 1635) Weight:  [191 lb (86.637 kg)] 191 lb (86.637 kg) (01/17 0441) Last BM Date: 04/04/14  Filed Weights   04/05/14 0441  Weight: 191 lb (86.637 kg)   No intake or output data in the 24 hours ending 04/05/14 1140  Telemetry:  Sinus brady  Exam:  General:  NAD  HEENT:  unremarkable  Lungs:  CTA  Cardiac:  RRR, no JVD, no edema  Abdomen:  soft  MSK:  No deformity  Neuro:  No defecits  Psych:  Normal affect   Lab Results:  Basic Metabolic Panel:  Recent Labs Lab 04/04/14 1155 04/05/14 0431  NA 140 139  K 4.7 3.8  CL 107 108  CO2 23 27  GLUCOSE 122* 102*  BUN 13 13  CREATININE 1.12* 1.40*  CALCIUM 10.1 9.2  MG  --  1.8    Liver Function Tests: No results for input(s): AST, ALT, ALKPHOS, BILITOT, PROT, ALBUMIN in the last 168 hours.  CBC:  Recent Labs Lab 04/04/14 1155 04/05/14 0431  WBC 12.1* 7.0  HGB 14.8 12.1  HCT 42.7 36.5  MCV 96.6 97.1  PLT  184 141*    Cardiac Enzymes:  Recent Labs Lab 04/04/14 1922 04/04/14 2240 04/05/14 0431  TROPONINI <0.03 <0.03 <0.03   04/04/2014: D-Dimer 0.38   Lab Results  Component Value Date   TSH 0.630 04/05/2014     BNP: No results for input(s): PROBNP in the last 8760 hours.  Coagulation: No results for input(s): INR in the last 168 hours.   Medications:   Scheduled Medications: . ALPRAZolam  0.5 mg Oral QHS  . aspirin  81 mg Oral Daily  . diclofenac  75 mg Oral QHS  . levothyroxine  112 mcg Oral QAC breakfast  . lisinopril  10 mg Oral Daily  . pravastatin  40 mg Oral QHS  . propranolol  40 mg Oral TID  . pyridostigmine  30 mg Oral BID     Infusions: . heparin 1,100 Units/hr (04/05/14 0339)     PRN Medications:  acetaminophen, HYDROcodone-acetaminophen, ondansetron (ZOFRAN) IV, SUMAtriptan   Assessment:   Principal Problem:   Paroxysmal atrial fibrillation Active Problems:   Hypertension   IBS (irritable bowel syndrome)   Chest pain   Sinus bradycardia   AKI (acute kidney injury)     Plan/Discussion:    Cardiac enzymes have remained normal.  She has a stress test scheduled soon in our  [place] office. Echocardiogram is pending. This can be done as an outpatient. Creatinine has increased (1.12 >> 1.42).  Blood pressure tending to run low. Will hold ACE inhibitor for now.  Beta blocker was held secondary to bradycardia. However, she is asymptomatic. Will go ahead and continue with propranolol 40 mg 3 times a day. Will plan on follow-up BMET in the office Monday or Tuesday to recheck creatinine. She is a retired Marine scientist. She will continue to monitor her blood pressure and let us know if it tends to run high. She has significant thromboembolic risk factors. I had a long discussion with her and her daughter regarding anticoagulation including the risks and benefits. I have recommended Eliquis 5 mg twice a day.  I will ask the Pharmacy to consult.  She will need  follow-up BMET and CBC in the next 4 weeks in the [place] office. I will ask case management to see if Eliquis can be covered for her.  DC ASA.  Anticipate discharge after seen by attending cardiologist.   Richardson Dopp, PA-C   04/05/2014 11:40 AM Pager [phone]

## 2014-04-05 NOTE — Progress Notes (Addendum)
Patient seen and discussed with PA Kathlen Mody. New onset afib, no recurrence overnight. She has been on propanolol for migraines at home, recently decreased from 40mg  tid to bid. Presented with afib with RVR yesterday. We have increased her propanolol back to tid. She reports baseline heart rates in 40-50s for over 10 years that are asymptomatic, looking back at prior clinic notes this is confirmed. Did note some similar heart rates overnight. Given continued need for propanolol for her migraines, will try to control afib with current meds and increase back to tid. Instructed also ok if recurrent episode ok to take extra propanolol. If not able to be controlled on propanolol or worsening bradycardia may need consideration for antiarrhythmic therapy at follow ups, she has an echo pending which will help with selection of appropriate agent if needed. CHADS2Vasc score of 4, will start anticoag with eliquis 5mg  bid. . Low normal bp, we will stop her lisionpril. 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 She has outpt stress ordered, will keep that scheduled. Will start eliquis 5mg  bid. She will be discharged after echo today, does not need to stay for results.    Zandra Abts MD

## 2014-04-05 NOTE — Progress Notes (Signed)
ANTICOAGULATION CONSULT NOTE - Follow Up Consult  Pharmacy Consult for heparin Indication: chest pain/ACS and atrial fibrillation  Labs:  Recent Labs  04/04/14 1155 04/04/14 1922 04/04/14 2240 04/05/14 0254  HGB 14.8  --   --   --   HCT 42.7  --   --   --   PLT 184  --   --   --   HEPARINUNFRC  --   --   --  0.92*  CREATININE 1.12*  --   --   --   TROPONINI  --  <0.03 <0.03  --     Assessment: 73yo female supratherapeutic on heparin with initial dosing for new Afib and CP.  Goal of Therapy:  Heparin level 0.3-0.7 units/ml   Plan:  Will decrease heparin gtt by 2-3 units/kg/hr to 1100 units/hr and check level in [place], PharmD, BCPS  04/05/2014,3:32 AM

## 2014-04-05 NOTE — Discharge Instructions (Signed)
Keep track of your blood pressure. If it consistently runs >140/90, please call our office in [place] [phone]).  Information on my medicine - ELIQUIS (apixaban)  This medication education was reviewed with me or my healthcare representative as part of my discharge preparation.  The pharmacist that spoke with me during my hospital stay was:  [facility], [facility]  Why was Eliquis prescribed for you? Eliquis was prescribed for you to reduce the risk of a blood clot forming that can cause a stroke if you have a medical condition called atrial fibrillation (a type of irregular heartbeat).  What do You need to know about Eliquis ? Take your Eliquis TWICE DAILY - one tablet in the morning and one tablet in the evening with or without food. If you have difficulty swallowing the tablet whole please discuss with your pharmacist how to take the medication safely.  Take Eliquis exactly as prescribed by your doctor and DO NOT stop taking Eliquis without talking to the doctor who prescribed the medication.  Stopping may increase your risk of developing a stroke.  Refill your prescription before you run out.  After discharge, you should have regular check-up appointments with your healthcare provider that is prescribing your Eliquis.  In the future your dose may need to be changed if your kidney function or weight changes by a significant amount or as you get older.  What do you do if you miss a dose? If you miss a dose, take it as soon as you remember on the same day and resume taking twice daily.  Do not take more than one dose of ELIQUIS at the same time to make up a missed dose.  Important Safety Information A possible side effect of Eliquis is bleeding. You should call your healthcare provider right away if you experience any of the following: ? Bleeding from an injury or your nose that does not stop. ? Unusual colored urine (red or dark brown) or unusual colored stools (red  or black). ? Unusual bruising for unknown reasons. ? A serious fall or if you hit your head (even if there is no bleeding).  Some medicines may interact with Eliquis and might increase your risk of bleeding or clotting while on Eliquis. To help avoid this, consult your healthcare provider or pharmacist prior to using any new prescription or non-prescription medications, including herbals, vitamins, non-steroidal anti-inflammatory drugs (NSAIDs) and supplements.  This website has more information on Eliquis (apixaban): http://www.eliquis.com/eliquis/home  Trial of Tramadol for pain control off Voltaren (NSAID)  STOP Voltaren (Diclofenac)  Start Tramadol.  Take 1 tablet twice daily.  After one day if pain increasing take 2 tablets.  After 3 days if pain is limiting movement.  Stop tramadol and return to Voltaren 1 tablet at bedtime.

## 2014-04-06 ENCOUNTER — Other Ambulatory Visit: Payer: Self-pay

## 2014-04-06 ENCOUNTER — Telehealth (INDEPENDENT_AMBULATORY_CARE_PROVIDER_SITE_OTHER): Payer: Self-pay | Admitting: *Deleted

## 2014-04-06 ENCOUNTER — Telehealth: Payer: Self-pay | Admitting: *Deleted

## 2014-04-06 DIAGNOSIS — I4891 Unspecified atrial fibrillation: Secondary | ICD-10-CM

## 2014-04-06 NOTE — Telephone Encounter (Signed)
Pt given 3 boxes of eliquis  5 mgand she has not activated her 30 day free samples yet. i encouraged her to do so

## 2014-04-06 NOTE — Telephone Encounter (Signed)
Pt was told by Dr Harl Bowie to come to office and pick up eliquis samples

## 2014-04-06 NOTE — Telephone Encounter (Signed)
Patient called the office and she states that on this past Friday she noted  Pink mucous in stool. The following morning she awoke and was in A Fib, in the 140's,by the time she got to [facility] Heart Rate was 150. She Cadio Converted. She was on a Heparin Drip at hospital and d/c home on Eliquis. Patient is concerned as she states that she has had a history of being positive blood in stool. This was discussed with Dr.Rehman, he ask that an appointment me made for the patient in 2 weeks. Appointment made for 04/20/14 @ 11 am. Patient was made aware,also advised that if she had other problems during th is time to call our office.

## 2014-04-07 ENCOUNTER — Other Ambulatory Visit (HOSPITAL_COMMUNITY): Payer: Self-pay | Admitting: Oncology

## 2014-04-07 DIAGNOSIS — C755 Malignant neoplasm of aortic body and other paraganglia: Secondary | ICD-10-CM | POA: Diagnosis not present

## 2014-04-07 DIAGNOSIS — R531 Weakness: Secondary | ICD-10-CM | POA: Diagnosis not present

## 2014-04-07 DIAGNOSIS — I480 Paroxysmal atrial fibrillation: Secondary | ICD-10-CM | POA: Diagnosis not present

## 2014-04-08 ENCOUNTER — Ambulatory Visit (HOSPITAL_COMMUNITY)
Admission: RE | Admit: 2014-04-08 | Discharge: 2014-04-08 | Disposition: A | Discharge status: Home / Self Care | Payer: Medicare Other | Source: Ambulatory Visit | Attending: Cardiovascular Disease | Admitting: Cardiovascular Disease

## 2014-04-08 ENCOUNTER — Telehealth: Payer: Self-pay | Admitting: *Deleted

## 2014-04-08 ENCOUNTER — Encounter (HOSPITAL_COMMUNITY)
Admission: RE | Admit: 2014-04-08 | Discharge: 2014-04-08 | Disposition: A | Discharge status: Home / Self Care | Payer: Medicare Other | Source: Ambulatory Visit | Attending: Physician Assistant | Admitting: Physician Assistant

## 2014-04-08 ENCOUNTER — Ambulatory Visit (HOSPITAL_COMMUNITY)
Admission: RE | Admit: 2014-04-08 | Discharge: 2014-04-08 | Disposition: A | Discharge status: Home / Self Care | Payer: Medicare Other | Source: Ambulatory Visit | Attending: Physician Assistant | Admitting: Physician Assistant

## 2014-04-08 ENCOUNTER — Encounter (HOSPITAL_COMMUNITY): Payer: Self-pay

## 2014-04-08 DIAGNOSIS — I4891 Unspecified atrial fibrillation: Secondary | ICD-10-CM | POA: Diagnosis not present

## 2014-04-08 DIAGNOSIS — I480 Paroxysmal atrial fibrillation: Secondary | ICD-10-CM

## 2014-04-08 DIAGNOSIS — R079 Chest pain, unspecified: Secondary | ICD-10-CM | POA: Diagnosis not present

## 2014-04-08 DIAGNOSIS — I059 Rheumatic mitral valve disease, unspecified: Secondary | ICD-10-CM | POA: Diagnosis not present

## 2014-04-08 MED ORDER — SODIUM CHLORIDE 0.9 % IJ SOLN
INTRAMUSCULAR | Status: AC
  Administered 2014-04-08: 10 mL via INTRAVENOUS
  Filled 2014-04-08: qty 3

## 2014-04-08 MED ORDER — SODIUM CHLORIDE 0.9 % IJ SOLN
10.0000 mL | INTRAMUSCULAR | Status: DC | PRN
  Administered 2014-04-08: 10 mL via INTRAVENOUS
  Filled 2014-04-08: qty 10

## 2014-04-08 MED ORDER — TECHNETIUM TC 99M SESTAMIBI - CARDIOLITE
30.0000 | Freq: Once | INTRAVENOUS | Status: AC | PRN
  Administered 2014-04-08: 30 via INTRAVENOUS

## 2014-04-08 MED ORDER — TECHNETIUM TC 99M SESTAMIBI GENERIC - CARDIOLITE
10.0000 | Freq: Once | INTRAVENOUS | Status: AC | PRN
  Administered 2014-04-08: 10 via INTRAVENOUS

## 2014-04-08 MED ORDER — REGADENOSON 0.4 MG/5ML IV SOLN
INTRAVENOUS | Status: AC
  Administered 2014-04-08: 0.4 mg via INTRAVENOUS
  Filled 2014-04-08: qty 5

## 2014-04-08 MED ORDER — REGADENOSON 0.4 MG/5ML IV SOLN
0.4000 mg | Freq: Once | INTRAVENOUS | Status: AC | PRN
  Administered 2014-04-08: 0.4 mg via INTRAVENOUS

## 2014-04-08 NOTE — Progress Notes (Signed)
  Echocardiogram 2D Echocardiogram has been performed.  [place], [place] 04/08/2014, 11:32 AM

## 2014-04-08 NOTE — Telephone Encounter (Signed)
Pt notified of echo results and findings on echo. Pt verbalized understanding to results given today. I will mail out report to pt today. Confirmed 04/16/14 appt w/Dr. [name].

## 2014-04-08 NOTE — Progress Notes (Signed)
Stress Lab Nurses Notes - [name]  [name] 04/08/2014 Reason for doing test: Chest pain , AFib & bradycardia Type of test: Leane Call Nurse performing test: Gerrit Halls, RN Nuclear Medicine Tech: Redmond Baseman Echo Tech: Not Applicable MD performing test: S. McDowell/M.Bonnell Public PA Family MD: [place] Crane Test explained and consent signed: Yes.   IV started: Saline lock flushed, No redness or edema and Saline lock started in radiology Symptoms: SOB Treatment/Intervention: None Reason test stopped: protocol completed After recovery IV was: Discontinued via X-ray tech and No redness or edema Patient to return to Nuc. Med at :10:45 Patient discharged: Home Patient's Condition upon discharge was: stable Comments: During test BP 103/72 & HR 51.  Recovery BP 109/63 & HR 48.  Symptoms resolved in recovery.  Geanie Cooley T

## 2014-04-09 ENCOUNTER — Ambulatory Visit (HOSPITAL_COMMUNITY): Payer: Medicare Other | Admitting: Oncology

## 2014-04-16 ENCOUNTER — Ambulatory Visit (INDEPENDENT_AMBULATORY_CARE_PROVIDER_SITE_OTHER): Payer: Medicare Other | Admitting: Cardiovascular Disease

## 2014-04-16 ENCOUNTER — Encounter: Payer: Self-pay | Admitting: Cardiovascular Disease

## 2014-04-16 VITALS — BP 105/70 | HR 43 | Ht 64.0 in | Wt 192.0 lb

## 2014-04-16 DIAGNOSIS — Z5189 Encounter for other specified aftercare: Secondary | ICD-10-CM

## 2014-04-16 DIAGNOSIS — E785 Hyperlipidemia, unspecified: Secondary | ICD-10-CM | POA: Diagnosis not present

## 2014-04-16 DIAGNOSIS — R079 Chest pain, unspecified: Secondary | ICD-10-CM

## 2014-04-16 DIAGNOSIS — C755 Malignant neoplasm of aortic body and other paraganglia: Secondary | ICD-10-CM | POA: Diagnosis not present

## 2014-04-16 DIAGNOSIS — I10 Essential (primary) hypertension: Secondary | ICD-10-CM

## 2014-04-16 DIAGNOSIS — R001 Bradycardia, unspecified: Secondary | ICD-10-CM | POA: Diagnosis not present

## 2014-04-16 DIAGNOSIS — R52 Pain, unspecified: Secondary | ICD-10-CM

## 2014-04-16 DIAGNOSIS — I480 Paroxysmal atrial fibrillation: Secondary | ICD-10-CM

## 2014-04-16 DIAGNOSIS — R531 Weakness: Secondary | ICD-10-CM | POA: Diagnosis not present

## 2014-04-16 MED ORDER — PROPRANOLOL HCL 40 MG PO TABS
40.0000 mg | ORAL_TABLET | Freq: Two times a day (BID) | ORAL | Status: DC
Start: 2014-04-16 — End: 2014-04-28

## 2014-04-16 MED ORDER — TRAMADOL HCL 50 MG PO TABS: 50.0000 mg | ORAL_TABLET | Freq: Four times a day (QID) | ORAL | Status: DC | PRN

## 2014-04-16 NOTE — Progress Notes (Signed)
Patient ID: [name], female   DOB: September 24, 1940, 74 y.o.   MRN: [id number]      SUBJECTIVE: The patient returns for follow-up after being hospitalized for atrial fibrillation with a rapid ventricular response. She was started on Eliquis and propranolol was increased to 40 mg 3 times a day from her prior dose of twice daily. She underwent an outpatient echocardiogram on January 20 which demonstrated normal left ventricular systolic function, EF 69-45%, moderate LVH with grade 1 diastolic dysfunction, and mild mitral and tricuspid regurgitation. She underwent nuclear stress testing which showed a very small apical anterolateral defect which was deemed secondary to either variable soft tissue attenuation versus a very small region of ischemia. There were no large ischemic zones and it was deemed a low risk stress test overall with normal left ventricle systolic function and regional wall motion. She presently denies any palpitations nor bleeding complications. She has become more short of breath. Her resting heart rate is 44 bpm. She said she primarily experienced chest pain when she had palpitations. She was given a prescription for tramadol 50-100 mg twice daily as needed but she did not fill this prescription as she normally takes it every 6 hours as needed and wants to stop taking hydrocodone.     Review of Systems: As per "subjective", otherwise negative.  Allergies  Allergen Reactions  . Avocado Diarrhea and Nausea And Vomiting  . Macrodantin [Nitrofurantoin Macrocrystal] Nausea And Vomiting  . Tetracyclines & Related Nausea And Vomiting  . Ampicillin Rash  . Biaxin [Clarithromycin] Rash  . Codeine Itching    Can not take Codeine unless in cough syrup    Current Outpatient Prescriptions  Medication Sig Dispense Refill  . ALPRAZolam (XANAX) 1 MG tablet Take 1 mg by mouth at bedtime as needed for sleep. HS as needed and can be repeated x 1 if needed. insomnia    . amitriptyline  (ELAVIL) 25 MG tablet Take 25 mg by mouth at bedtime.    Marland Kitchen apixaban (ELIQUIS) 5 MG TABS tablet Take 1 tablet (5 mg total) by mouth 2 (two) times daily. 60 tablet 11  . Calcium Carbonate-Vit D-Min (CALTRATE 600+D PLUS MINERALS PO) Take 1 tablet by mouth 2 (two) times daily.     Marland Kitchen HYDROcodone-acetaminophen (NORCO) 10-325 MG per tablet Take 1 tablet by mouth every 6 (six) hours as needed. pain    . lansoprazole (PREVACID) 30 MG capsule TAKE ONE CAPSULE BY MOUTH TWICE A DAY 60 capsule 5  . levothyroxine (SYNTHROID, LEVOTHROID) 112 MCG tablet Take 112 mcg by mouth daily before breakfast.     . lidocaine (LIDODERM) 5 % Place 1-3 patches onto the skin daily as needed. Remove & Discard patch within 12 hours or as directed by MD for pain    . Magnesium 250 MG TABS Take 250 mg by mouth daily. Takes up to twice daily as needed for muscle cramps    . Multiple Vitamin (MULTIVITAMIN WITH MINERALS) TABS Take 1 tablet by mouth daily.    . NON FORMULARY Apply 1 application topically 2 (two) times daily. Nail clear from dermatologist    . Omega-3 Fatty Acids (FISH OIL) 1200 MG CAPS Take 1,200 mg by mouth daily.     . ondansetron (ZOFRAN) 4 MG tablet Take 4 mg by mouth daily as needed.    . pravastatin (PRAVACHOL) 80 MG tablet Take 40 mg by mouth at bedtime.     . propranolol (INDERAL) 40 MG tablet Take 1 tablet (40 mg total)  by mouth 3 (three) times daily. 90 tablet 6  . pyridostigmine (MESTINON) 60 MG tablet TAKE 0.5 TABLETS (30 MG TOTAL) BY MOUTH 3 (THREE) TIMES DAILY. 45 tablet 0  . SUMAtriptan (IMITREX) 50 MG tablet Take 50 mg by mouth every 2 (two) hours as needed. For migraines    . traMADol (ULTRAM) 50 MG tablet Take 1-2 tablets (50-100 mg total) by mouth every 12 (twelve) hours as needed for moderate pain. (Patient not taking: Reported on 04/16/2014) 30 tablet 1   No current facility-administered medications for this visit.    Past Medical History  Diagnosis Date  . Unspecified essential hypertension     . Mixed hyperlipidemia   . Obesity, unspecified   . Esophageal reflux   . Hypertension     over 10 yrs  . Hypothyroidism   . Migraines   . Osteoarthritis of knee     both knees  . IBS (irritable colon syndrome)   . Ocular myasthenia gravis   . Complication of anesthesia     Low blood pressure, heart rate, O2 sat  . PAF (paroxysmal atrial fibrillation)   . Anxiety   . History of pneumonia     hx of   . Cancer     hx of skin cancer   . Anemia   . Hx of echocardiogram     a. Echo (03/2014): Moderate LVH, EF 55-60%, normal wall motion, grade 1 diastolic dysfunction, mild MR, moderate LAE, mild RAE, mild TR, PASP 31 mmHg    Past Surgical History  Procedure Laterality Date  . Knee surgery    . Total knee arthroplasty      both knee. 2005 1st, 2011 the last one  . Abdominal hysterectomy    . Cholecystectomy    . Nasal sinus surgery      30 yrs ago  . Colonoscopy  08/24/2011    Procedure: COLONOSCOPY;  Surgeon: Rogene Houston, MD;  Location: [facility];  Service: Endoscopy;  Laterality: N/A;  1200  . Ct guided biopsy  01/16/12  . Givens capsule study  01/29/2012    Procedure: GIVENS CAPSULE STUDY;  Surgeon: Rogene Houston, MD;  Location: [facility];  Service: Endoscopy;  Laterality: N/A;  730  . Cardiac catheterization    . Laparotomy  03/01/2012    Procedure: EXPLORATORY LAPAROTOMY;  Surgeon: Earnstine Regal, MD;  Location: [facility];  Service: General;  Laterality: N/A;  Exploratory Laparotomy ,Resection Retroperitoneal Mass    History   Social History  . Marital Status: Divorced    Spouse Name: N/A    Number of Children: N/A  . Years of Education: N/A   Occupational History  . Not on file.   Social History Main Topics  . Smoking status: Never Smoker   . Smokeless tobacco: Never Used  . Alcohol Use: No  . Drug Use: No  . Sexual Activity: Not on file   Other Topics Concern  . Not on file   Social History Narrative     Filed Vitals:   04/16/14 1134  BP:  105/70  Pulse: 43  Height: 5\' 4"  (1.626 m)  Weight: 192 lb (87.091 kg)    PHYSICAL EXAM General: NAD HEENT: Normal. Neck: No JVD, no thyromegaly. Lungs: Clear to auscultation bilaterally with normal respiratory effort. CV: Nondisplaced PMI.  Bradycardic, regular rhythm, normal S1/S2, no S3/S4, no murmur. No pretibial or periankle edema.    Abdomen: Soft, no distention.  Neurologic: Alert and oriented x 3.  Psych: Normal affect. Skin: Normal. Musculoskeletal: No gross deformities. Extremities: No clubbing or cyanosis.   ECG: Most recent ECG reviewed.      ASSESSMENT AND PLAN: 1. Chest pain: Low risk stress test. No further cardiac testing is indicated. Normal LV systolic function and regional wall motion. Episodes were likely correspondent with rapid atrial fibrillation. 2. Essential HTN: Well controlled on current therapy which includes propranolol. Will monitor as I am reducing propranolol due to bradycardia and associated exertional dyspnea. 3. Hyperlipidemia: On pravastatin 40 mg. No changes. 4. Atrial fibrillation: Anticoagulated with Eliquis. Bradycardic and short of breath with current dose of propranolol. Will reduce to 40 mg bid. I mentioned other strategies such as ablation and antiarrhythmic therapy. She prefers propranolol as it helps with migraine prophylaxis. 5. Pain management: I will prescribe Tramadol 50 mg q 6 hrs prn and give a one month supply with no refills. I will d/c hydrocodone-acetaminophen.  Dispo: f/u 6 months.   Kate Sable, M.D., F.A.C.C.

## 2014-04-16 NOTE — Patient Instructions (Signed)
   Stop Hydrocodone  Tramadol 50mg  - one tab every 6 hours as needed - one month supply printed script given today.  Future refills from primary physician.  Decrease Propranolol to 40mg  twice a day  Continue all other medications.   Your physician wants you to follow up in: 6 months.  You will receive a reminder letter in the mail one-two months in advance.  If you don't receive a letter, please call our office to schedule the follow up appointment

## 2014-04-17 ENCOUNTER — Ambulatory Visit (HOSPITAL_COMMUNITY)
Admission: RE | Admit: 2014-04-17 | Discharge: 2014-04-17 | Disposition: A | Discharge status: Home / Self Care | Payer: Medicare Other | Source: Ambulatory Visit | Attending: Oncology | Admitting: Oncology

## 2014-04-17 DIAGNOSIS — C755 Malignant neoplasm of aortic body and other paraganglia: Secondary | ICD-10-CM | POA: Diagnosis not present

## 2014-04-17 DIAGNOSIS — R599 Enlarged lymph nodes, unspecified: Secondary | ICD-10-CM | POA: Diagnosis not present

## 2014-04-17 LAB — GLUCOSE, CAPILLARY: Glucose-Capillary: 100 mg/dL — ABNORMAL HIGH (ref 70–99)

## 2014-04-17 MED ORDER — FLUDEOXYGLUCOSE F - 18 (FDG) INJECTION: 9.9000 | Freq: Once | INTRAVENOUS | Status: AC | PRN

## 2014-04-20 ENCOUNTER — Encounter (INDEPENDENT_AMBULATORY_CARE_PROVIDER_SITE_OTHER): Payer: Self-pay | Admitting: Internal Medicine

## 2014-04-20 ENCOUNTER — Ambulatory Visit (INDEPENDENT_AMBULATORY_CARE_PROVIDER_SITE_OTHER): Payer: Medicare Other | Admitting: Internal Medicine

## 2014-04-20 VITALS — BP 114/60 | HR 56 | Temp 97.4°F | Resp 16 | Ht 64.0 in | Wt 189.4 lb

## 2014-04-20 DIAGNOSIS — C755 Malignant neoplasm of aortic body and other paraganglia: Secondary | ICD-10-CM | POA: Diagnosis not present

## 2014-04-20 DIAGNOSIS — K219 Gastro-esophageal reflux disease without esophagitis: Secondary | ICD-10-CM

## 2014-04-20 DIAGNOSIS — E065 Other chronic thyroiditis: Secondary | ICD-10-CM | POA: Diagnosis not present

## 2014-04-20 DIAGNOSIS — Z8739 Personal history of other diseases of the musculoskeletal system and connective tissue: Secondary | ICD-10-CM | POA: Diagnosis not present

## 2014-04-20 DIAGNOSIS — E559 Vitamin D deficiency, unspecified: Secondary | ICD-10-CM | POA: Insufficient documentation

## 2014-04-20 DIAGNOSIS — D696 Thrombocytopenia, unspecified: Secondary | ICD-10-CM | POA: Diagnosis not present

## 2014-04-20 DIAGNOSIS — R718 Other abnormality of red blood cells: Secondary | ICD-10-CM | POA: Diagnosis not present

## 2014-04-20 DIAGNOSIS — R110 Nausea: Secondary | ICD-10-CM

## 2014-04-20 DIAGNOSIS — Z8349 Family history of other endocrine, nutritional and metabolic diseases: Secondary | ICD-10-CM | POA: Diagnosis not present

## 2014-04-20 DIAGNOSIS — K589 Irritable bowel syndrome without diarrhea: Secondary | ICD-10-CM

## 2014-04-20 NOTE — Progress Notes (Signed)
Presenting complaint;   Follow-up for GERD and IBS.  Patient complains of nausea.  Subjective:   patient is 74 year old Caucasian female who presents for scheduled visit. She was last seen on 06/10/2013.  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 She's been followed closely by Dr. Tressie Stalker and remains in remission. She had PET scan last week which was negative for recurrent disease.  She also has history of GERD. She says her symptoms are well controlled with double dose lansoprazole. She she also complains of intermittent dysphagia. She feels food goes down slowly but she has not had an episode of food impaction. She also complains of nausea and anorexia. She said she eats anyway and nausea does not get her better or worse. Nausea is most pronounced when she wakes up in the morning. Nausea gets better as the day progresses.  She states she was admitted to [facility] for tachyarrhythmia and diagnosed with paroxysmal atrial fibrillation and now she is on Eliquis. She reports she noted that blood-tinged mucus with her bowel movement a few weeks ago she has not seen any blood  since she has been on anticoagulant. She still has intermittent lower abdominal pain which is always relieved with defecation. Her weight has dropped by 2 pounds in the last 10 months.  Last colonoscopy was in June 2013 with removal of 2 small polyps and these are hyperplastic.  patient states she is having more back and joint pain. She does not like to take narcotic. When she was at [facility], diclofenac was discontinued and she wants to know if she could go back on this medication.   Current Medications: Outpatient Encounter Prescriptions as of 04/20/2014  Medication Sig  . ALPRAZolam (XANAX) 1 MG tablet Take 1 mg by mouth at bedtime as needed for sleep. HS as needed  and can be repeated x 1 if needed. insomnia  . amitriptyline (ELAVIL) 25 MG tablet Take 25 mg by mouth at bedtime.  Marland Kitchen apixaban (ELIQUIS) 5 MG TABS tablet Take 1 tablet (5 mg total) by mouth 2 (two) times daily.  . Calcium Carbonate-Vit D-Min (CALTRATE 600+D PLUS MINERALS PO) Take 1 tablet by mouth 2 (two) times daily.   Marland Kitchen HYDROcodone-acetaminophen (NORCO) 10-325 MG per tablet Take 1 tablet by mouth every 6 (six) hours as needed.  . lansoprazole (PREVACID) 30 MG capsule TAKE ONE CAPSULE BY MOUTH TWICE A DAY  . levothyroxine (SYNTHROID, LEVOTHROID) 112 MCG tablet Take 112 mcg by mouth daily before breakfast.   . lidocaine (LIDODERM) 5 % Place 1-3 patches onto the skin daily as needed. Remove & Discard patch within 12 hours or as directed by MD for pain  . lisinopril (PRINIVIL,ZESTRIL) 10 MG tablet Take 10 mg by mouth daily.   . Magnesium 250 MG TABS Take 250 mg by mouth daily. Takes up to twice daily as needed for muscle cramps  . Multiple Vitamin (MULTIVITAMIN WITH MINERALS) TABS Take 1 tablet by mouth daily.  . NON FORMULARY Apply 1 application topically 2 (two) times daily. Nail clear from dermatologist  . Omega-3 Fatty Acids (FISH OIL) 1200 MG CAPS Take 1,200 mg by mouth daily.   . ondansetron (ZOFRAN) 4 MG tablet Take 4 mg by mouth daily as needed.  . pravastatin (PRAVACHOL) 80 MG tablet Take 40 mg by mouth at bedtime.   . propranolol (INDERAL) 40 MG tablet  Take 1 tablet (40 mg total) by mouth 2 (two) times daily.  Marland Kitchen pyridostigmine (MESTINON) 60 MG tablet TAKE 0.5 TABLETS (30 MG TOTAL) BY MOUTH 3 (THREE) TIMES DAILY.  . SUMAtriptan (IMITREX) 50 MG tablet Take 50 mg by mouth every 2 (two) hours as needed. For migraines  . [DISCONTINUED] traMADol (ULTRAM) 50 MG tablet Take 1 tablet (50 mg total) by mouth every 6 (six) hours as needed for moderate pain. (Patient not taking: Reported on 04/20/2014)     Objective: Blood pressure 114/60, pulse 56, temperature 97.4 F (36.3 C), temperature source  Oral, resp. rate 16, height 5\' 4"  (1.626 m), weight 189 lb 6.4 oz (85.911 kg).  patient is alert and in no acute distress. Conjunctiva is pink. Sclera is nonicteric Oropharyngeal mucosa is normal. No neck masses or thyromegaly noted. Cardiac exam with regular rhythm normal S1 and S2. No murmur or gallop noted. Lungs are clear to auscultation. Abdomen is symmetrical. Bowel sounds are normal. On palpation abdomen is soft and nontender without organomegaly or masses. No LE edema or clubbing noted.  She has atrophy of both thenar muscles with flexion at first metacarpophalangeal joint bilaterally.    Assessment:  #1.  Chronic GERD. Heartburn is well controlled with double dose PPI. #2.  Sporadic dysphagia not bad enough to pursue with further evaluation. She possibly has esophageal motility disorder. However if dysphagia progresses will consider EGD. #3.  Chronic nausea and anorexia. Nausea does not appear to be of GI source. Both of these symptoms could be secondary to her medications. #4. Intermittent lower abdominal pain relieved with defecation felt to be due to IBS. She previously has been on dicyclomine. Pain is sporadic and therefore will hold off therapy.   Plan:   Patient advised to check with Dr. [name] if she could go back on NSAID. No contraindication from GI standpoint at this time. Continue lansoprazole 30 mg by mouth twice a day.  patient will try taking ondansetron every morning for a week and see if nausea could be ameliorated at least temporarily. Call if nausea or dysphagia gets worse. Office visit in 6 months.

## 2014-04-20 NOTE — Patient Instructions (Addendum)
Please check with Dr. [name] if you could take diclofenac on as-needed basis. Notify if nausea or swallowing difficulty gets worse. Try using ondansetron daily for a week and see if it helps ameliorate nausea.

## 2014-04-27 ENCOUNTER — Emergency Department (HOSPITAL_COMMUNITY): Payer: Medicare Other

## 2014-04-27 ENCOUNTER — Observation Stay (HOSPITAL_COMMUNITY)
Admission: EM | Admit: 2014-04-27 | Discharge: 2014-04-28 | Disposition: A | Discharge status: Home / Self Care | Payer: Medicare Other | Attending: Internal Medicine | Admitting: Internal Medicine

## 2014-04-27 ENCOUNTER — Encounter (HOSPITAL_COMMUNITY): Payer: Self-pay | Admitting: *Deleted

## 2014-04-27 ENCOUNTER — Telehealth: Payer: Self-pay | Admitting: *Deleted

## 2014-04-27 DIAGNOSIS — I951 Orthostatic hypotension: Principal | ICD-10-CM | POA: Insufficient documentation

## 2014-04-27 DIAGNOSIS — E669 Obesity, unspecified: Secondary | ICD-10-CM | POA: Diagnosis not present

## 2014-04-27 DIAGNOSIS — Z8701 Personal history of pneumonia (recurrent): Secondary | ICD-10-CM | POA: Insufficient documentation

## 2014-04-27 DIAGNOSIS — I10 Essential (primary) hypertension: Secondary | ICD-10-CM | POA: Diagnosis present

## 2014-04-27 DIAGNOSIS — R079 Chest pain, unspecified: Secondary | ICD-10-CM | POA: Diagnosis not present

## 2014-04-27 DIAGNOSIS — E7800 Pure hypercholesterolemia, unspecified: Secondary | ICD-10-CM | POA: Diagnosis present

## 2014-04-27 DIAGNOSIS — K589 Irritable bowel syndrome without diarrhea: Secondary | ICD-10-CM | POA: Diagnosis not present

## 2014-04-27 DIAGNOSIS — Z9181 History of falling: Secondary | ICD-10-CM | POA: Diagnosis not present

## 2014-04-27 DIAGNOSIS — R0600 Dyspnea, unspecified: Secondary | ICD-10-CM | POA: Insufficient documentation

## 2014-04-27 DIAGNOSIS — Z79899 Other long term (current) drug therapy: Secondary | ICD-10-CM | POA: Insufficient documentation

## 2014-04-27 DIAGNOSIS — E782 Mixed hyperlipidemia: Secondary | ICD-10-CM | POA: Insufficient documentation

## 2014-04-27 DIAGNOSIS — R5381 Other malaise: Secondary | ICD-10-CM | POA: Diagnosis not present

## 2014-04-27 DIAGNOSIS — I5032 Chronic diastolic (congestive) heart failure: Secondary | ICD-10-CM | POA: Diagnosis present

## 2014-04-27 DIAGNOSIS — I480 Paroxysmal atrial fibrillation: Secondary | ICD-10-CM | POA: Diagnosis present

## 2014-04-27 DIAGNOSIS — K219 Gastro-esophageal reflux disease without esophagitis: Secondary | ICD-10-CM | POA: Diagnosis not present

## 2014-04-27 DIAGNOSIS — R001 Bradycardia, unspecified: Secondary | ICD-10-CM | POA: Diagnosis not present

## 2014-04-27 DIAGNOSIS — Z85828 Personal history of other malignant neoplasm of skin: Secondary | ICD-10-CM | POA: Diagnosis not present

## 2014-04-27 DIAGNOSIS — R918 Other nonspecific abnormal finding of lung field: Secondary | ICD-10-CM | POA: Diagnosis not present

## 2014-04-27 DIAGNOSIS — D649 Anemia, unspecified: Secondary | ICD-10-CM | POA: Diagnosis not present

## 2014-04-27 DIAGNOSIS — F418 Other specified anxiety disorders: Secondary | ICD-10-CM | POA: Diagnosis present

## 2014-04-27 DIAGNOSIS — E440 Moderate protein-calorie malnutrition: Secondary | ICD-10-CM | POA: Diagnosis present

## 2014-04-27 DIAGNOSIS — G8929 Other chronic pain: Secondary | ICD-10-CM | POA: Insufficient documentation

## 2014-04-27 DIAGNOSIS — E039 Hypothyroidism, unspecified: Secondary | ICD-10-CM | POA: Diagnosis not present

## 2014-04-27 DIAGNOSIS — J449 Chronic obstructive pulmonary disease, unspecified: Secondary | ICD-10-CM | POA: Diagnosis not present

## 2014-04-27 DIAGNOSIS — G43909 Migraine, unspecified, not intractable, without status migrainosus: Secondary | ICD-10-CM | POA: Diagnosis not present

## 2014-04-27 DIAGNOSIS — Z9889 Other specified postprocedural states: Secondary | ICD-10-CM | POA: Insufficient documentation

## 2014-04-27 DIAGNOSIS — K210 Gastro-esophageal reflux disease with esophagitis: Secondary | ICD-10-CM | POA: Diagnosis not present

## 2014-04-27 DIAGNOSIS — G7000 Myasthenia gravis without (acute) exacerbation: Secondary | ICD-10-CM | POA: Diagnosis not present

## 2014-04-27 DIAGNOSIS — N390 Urinary tract infection, site not specified: Secondary | ICD-10-CM | POA: Diagnosis present

## 2014-04-27 DIAGNOSIS — M179 Osteoarthritis of knee, unspecified: Secondary | ICD-10-CM | POA: Diagnosis not present

## 2014-04-27 DIAGNOSIS — I5030 Unspecified diastolic (congestive) heart failure: Secondary | ICD-10-CM | POA: Diagnosis present

## 2014-04-27 DIAGNOSIS — I4891 Unspecified atrial fibrillation: Secondary | ICD-10-CM | POA: Diagnosis not present

## 2014-04-27 DIAGNOSIS — R0602 Shortness of breath: Secondary | ICD-10-CM | POA: Diagnosis not present

## 2014-04-27 DIAGNOSIS — D696 Thrombocytopenia, unspecified: Secondary | ICD-10-CM | POA: Diagnosis present

## 2014-04-27 HISTORY — DX: Diverticulosis of intestine, part unspecified, without perforation or abscess without bleeding: K57.90

## 2014-04-27 HISTORY — DX: Essential (primary) hypertension: I10

## 2014-04-27 HISTORY — DX: Neoplasm of uncertain behavior of aortic body and other paraganglia: D44.7

## 2014-04-27 HISTORY — DX: Nausea: R11.0

## 2014-04-27 HISTORY — DX: Obesity, unspecified: E66.9

## 2014-04-27 HISTORY — DX: Gastro-esophageal reflux disease without esophagitis: K21.9

## 2014-04-27 HISTORY — DX: Other specified anxiety disorders: F41.8

## 2014-04-27 HISTORY — DX: Other chronic pain: G89.29

## 2014-04-27 HISTORY — DX: Unspecified malignant neoplasm of skin, unspecified: C44.90

## 2014-04-27 HISTORY — DX: Dorsalgia, unspecified: M54.9

## 2014-04-27 LAB — BASIC METABOLIC PANEL
ANION GAP: 8 (ref 5–15)
BUN: 12 mg/dL (ref 6–23)
CO2: 23 mmol/L (ref 19–32)
Calcium: 10 mg/dL (ref 8.4–10.5)
Chloride: 110 mmol/L (ref 96–112)
Creatinine, Ser: 0.95 mg/dL (ref 0.50–1.10)
GFR calc non Af Amer: 57 mL/min — ABNORMAL LOW (ref >90)
GFR, EST AFRICAN AMERICAN: 66 mL/min — AB (ref >90)
Glucose, Bld: 95 mg/dL (ref 70–99)
Potassium: 3.9 mmol/L (ref 3.5–5.1)
SODIUM: 141 mmol/L (ref 135–145)

## 2014-04-27 LAB — TROPONIN I
Troponin I: <0.03 ng/mL (ref <0.031)
Troponin I: <0.03 ng/mL (ref <0.031)

## 2014-04-27 LAB — CBC WITH DIFFERENTIAL/PLATELET
BASOS ABS: 0 10*3/uL (ref 0.0–0.1)
BASOS PCT: 1 % (ref 0–1)
Eosinophils Absolute: 0.1 10*3/uL (ref 0.0–0.7)
Eosinophils Relative: 2 % (ref 0–5)
HEMATOCRIT: 38 % (ref 36.0–46.0)
Hemoglobin: 12.6 g/dL (ref 12.0–15.0)
LYMPHS ABS: 1.9 10*3/uL (ref 0.7–4.0)
LYMPHS PCT: 37 % (ref 12–46)
MCH: 32.6 pg (ref 26.0–34.0)
MCHC: 33.2 g/dL (ref 30.0–36.0)
MCV: 98.2 fL (ref 78.0–100.0)
MONOS PCT: 10 % (ref 3–12)
Monocytes Absolute: 0.5 10*3/uL (ref 0.1–1.0)
Neutro Abs: 2.6 10*3/uL (ref 1.7–7.7)
Neutrophils Relative %: 51 % (ref 43–77)
Platelets: 161 10*3/uL (ref 150–400)
RBC: 3.87 MIL/uL (ref 3.87–5.11)
RDW: 12.3 % (ref 11.5–15.5)
WBC: 5.1 10*3/uL (ref 4.0–10.5)

## 2014-04-27 LAB — URINALYSIS, ROUTINE W REFLEX MICROSCOPIC
Bilirubin Urine: NEGATIVE
Glucose, UA: NEGATIVE mg/dL
Ketones, ur: NEGATIVE mg/dL
Nitrite: NEGATIVE
PH: 5.5 (ref 5.0–8.0)
PROTEIN: NEGATIVE mg/dL
Specific Gravity, Urine: <1.005 — ABNORMAL LOW (ref 1.005–1.030)
Urobilinogen, UA: 0.2 mg/dL (ref 0.0–1.0)

## 2014-04-27 LAB — URINE MICROSCOPIC-ADD ON

## 2014-04-27 LAB — BRAIN NATRIURETIC PEPTIDE: B NATRIURETIC PEPTIDE 5: 83 pg/mL (ref 0.0–100.0)

## 2014-04-27 MED ORDER — ONDANSETRON HCL 4 MG PO TABS: 4.0000 mg | ORAL_TABLET | Freq: Every day | ORAL | Status: DC | PRN

## 2014-04-27 MED ORDER — MORPHINE SULFATE 2 MG/ML IJ SOLN: 2.0000 mg | INTRAMUSCULAR | Status: DC | PRN

## 2014-04-27 MED ORDER — APIXABAN 5 MG PO TABS
5.0000 mg | ORAL_TABLET | Freq: Two times a day (BID) | ORAL | Status: DC
  Administered 2014-04-27 – 2014-04-28 (×2): 5 mg via ORAL
  Filled 2014-04-27 (×2): qty 1

## 2014-04-27 MED ORDER — LIDOCAINE 5 % EX PTCH
1.0000 | MEDICATED_PATCH | Freq: Every day | CUTANEOUS | Status: DC | PRN
  Filled 2014-04-27: qty 3

## 2014-04-27 MED ORDER — IPRATROPIUM-ALBUTEROL 0.5-2.5 (3) MG/3ML IN SOLN
3.0000 mL | Freq: Once | RESPIRATORY_TRACT | Status: AC
  Administered 2014-04-27: 3 mL via RESPIRATORY_TRACT
  Filled 2014-04-27: qty 3

## 2014-04-27 MED ORDER — SODIUM CHLORIDE 0.9 % IV SOLN
INTRAVENOUS | Status: DC
  Administered 2014-04-28: 07:00:00 via INTRAVENOUS

## 2014-04-27 MED ORDER — IPRATROPIUM-ALBUTEROL 0.5-2.5 (3) MG/3ML IN SOLN: 3.0000 mL | RESPIRATORY_TRACT | Status: DC | PRN

## 2014-04-27 MED ORDER — ONDANSETRON HCL 4 MG PO TABS: 4.0000 mg | ORAL_TABLET | Freq: Four times a day (QID) | ORAL | Status: DC | PRN

## 2014-04-27 MED ORDER — AMITRIPTYLINE HCL 25 MG PO TABS
25.0000 mg | ORAL_TABLET | Freq: Every day | ORAL | Status: DC
  Administered 2014-04-27: 25 mg via ORAL
  Filled 2014-04-27: qty 1

## 2014-04-27 MED ORDER — PYRIDOSTIGMINE BROMIDE 60 MG PO TABS
30.0000 mg | ORAL_TABLET | Freq: Three times a day (TID) | ORAL | Status: DC
  Administered 2014-04-27: 30 mg via ORAL
  Filled 2014-04-27 (×10): qty 0.5

## 2014-04-27 MED ORDER — HYDROCODONE-ACETAMINOPHEN 5-325 MG PO TABS: 2.0000 | ORAL_TABLET | Freq: Once | ORAL | Status: DC

## 2014-04-27 MED ORDER — PROPRANOLOL HCL 20 MG PO TABS
40.0000 mg | ORAL_TABLET | Freq: Two times a day (BID) | ORAL | Status: DC
  Filled 2014-04-27: qty 2

## 2014-04-27 MED ORDER — PANTOPRAZOLE SODIUM 40 MG PO TBEC: 40.0000 mg | DELAYED_RELEASE_TABLET | Freq: Every day | ORAL | Status: DC

## 2014-04-27 MED ORDER — ACETAMINOPHEN 325 MG PO TABS: 650.0000 mg | ORAL_TABLET | ORAL | Status: DC | PRN

## 2014-04-27 MED ORDER — SODIUM CHLORIDE 0.9 % IV SOLN: INTRAVENOUS | Status: DC

## 2014-04-27 MED ORDER — ENSURE PUDDING PO PUDG
1.0000 | Freq: Three times a day (TID) | ORAL | Status: DC
  Administered 2014-04-27 – 2014-04-28 (×2): 1 via ORAL

## 2014-04-27 MED ORDER — BOOST / RESOURCE BREEZE PO LIQD
1.0000 | Freq: Three times a day (TID) | ORAL | Status: DC
  Administered 2014-04-27 – 2014-04-28 (×2): 1 via ORAL

## 2014-04-27 MED ORDER — ALPRAZOLAM 1 MG PO TABS
1.0000 mg | ORAL_TABLET | Freq: Every evening | ORAL | Status: DC | PRN
  Administered 2014-04-27: 1 mg via ORAL
  Filled 2014-04-27: qty 1

## 2014-04-27 MED ORDER — ONDANSETRON HCL 4 MG/2ML IJ SOLN: 4.0000 mg | Freq: Four times a day (QID) | INTRAMUSCULAR | Status: DC | PRN

## 2014-04-27 MED ORDER — SODIUM CHLORIDE 0.9 % IJ SOLN
3.0000 mL | Freq: Two times a day (BID) | INTRAMUSCULAR | Status: DC
  Administered 2014-04-27: 3 mL via INTRAVENOUS

## 2014-04-27 MED ORDER — LEVOTHYROXINE SODIUM 112 MCG PO TABS
112.0000 ug | ORAL_TABLET | Freq: Every day | ORAL | Status: DC
  Administered 2014-04-28: 112 ug via ORAL
  Filled 2014-04-27: qty 1

## 2014-04-27 MED ORDER — PANTOPRAZOLE SODIUM 40 MG PO TBEC
40.0000 mg | DELAYED_RELEASE_TABLET | Freq: Every day | ORAL | Status: DC
  Administered 2014-04-28: 40 mg via ORAL
  Filled 2014-04-27: qty 1

## 2014-04-27 MED ORDER — ALBUTEROL SULFATE (2.5 MG/3ML) 0.083% IN NEBU: 2.5000 mg | INHALATION_SOLUTION | RESPIRATORY_TRACT | Status: DC | PRN

## 2014-04-27 MED ORDER — ENSURE COMPLETE PO LIQD
237.0000 mL | Freq: Two times a day (BID) | ORAL | Status: DC
  Administered 2014-04-28 (×2): 237 mL via ORAL

## 2014-04-27 MED ORDER — LISINOPRIL 10 MG PO TABS
10.0000 mg | ORAL_TABLET | Freq: Every day | ORAL | Status: DC
  Filled 2014-04-27: qty 1

## 2014-04-27 MED ORDER — PRAVASTATIN SODIUM 40 MG PO TABS
40.0000 mg | ORAL_TABLET | Freq: Every day | ORAL | Status: DC
  Administered 2014-04-27: 40 mg via ORAL
  Filled 2014-04-27: qty 1

## 2014-04-27 MED ORDER — SODIUM CHLORIDE 0.9 % IV SOLN
INTRAVENOUS | Status: DC
  Administered 2014-04-27: 15:00:00 via INTRAVENOUS

## 2014-04-27 MED ORDER — SODIUM CHLORIDE 0.9 % IV BOLUS (SEPSIS)
250.0000 mL | Freq: Once | INTRAVENOUS | Status: AC
  Administered 2014-04-27: 250 mL via INTRAVENOUS

## 2014-04-27 MED ORDER — GI COCKTAIL ~~LOC~~: 30.0000 mL | Freq: Four times a day (QID) | ORAL | Status: DC | PRN

## 2014-04-27 MED ORDER — HYDROCODONE-ACETAMINOPHEN 5-325 MG PO TABS
2.0000 | ORAL_TABLET | Freq: Once | ORAL | Status: AC
  Administered 2014-04-27: 2 via ORAL
  Filled 2014-04-27: qty 2

## 2014-04-27 NOTE — ED Notes (Signed)
Pt with CP and Sob since yesterday, recent admission at [facility] for Afib

## 2014-04-27 NOTE — Telephone Encounter (Signed)
Pt is SOB, weak and dizzy. O2 states are 88.

## 2014-04-27 NOTE — ED Provider Notes (Signed)
CSN: [id number]     Arrival date & time 04/27/14  1200 History   First MD Initiated Contact with Patient 04/27/14 1315     Chief Complaint  Patient presents with  . Shortness of Breath     HPI Pt was seen at 1315.  Per pt and her family, c/o gradual onset and worsening of persistent SOB, palpitations and generalized fatigue/weakness for the past 3 weeks. Pt states her symptoms have worsened since her hospital discharge last month for dx new onset afib. Pt states her symptoms have been worsening when she ambulates, but yesterday occurred at rest. Pt's daughter states pt's Sats were "in the 80's" at home. Pt denies CP, no cough, no abd pain, no N/V/D, no syncope, no focal motor weakness, no tingling/numbness in extremities.    Past Medical History  Diagnosis Date  . Unspecified essential hypertension   . Mixed hyperlipidemia   . Obesity, unspecified   . Esophageal reflux   . Hypertension     over 10 yrs  . Hypothyroidism   . Migraines   . Osteoarthritis of knee     both knees  . IBS (irritable colon syndrome)   . Ocular myasthenia gravis   . Complication of anesthesia     Low blood pressure, heart rate, O2 sat  . PAF (paroxysmal atrial fibrillation)   . History of pneumonia     hx of   . Cancer     hx of skin cancer   . Anemia   . Hx of echocardiogram     a. Echo (03/2014): Moderate LVH, EF 55-60%, normal wall motion, grade 1 diastolic dysfunction, mild MR, moderate LAE, mild RAE, mild TR, PASP 31 mmHg  . Diverticulosis   . Chronic back pain   . Chronic nausea   . 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 Chronic joint pain    Past Surgical History  Procedure Laterality Date  . Knee surgery    . Total knee arthroplasty      both knee. 2005 1st, 2011 the last one  . Abdominal hysterectomy    . Cholecystectomy    . Nasal sinus surgery      30 yrs ago  . Colonoscopy  08/24/2011    Procedure: COLONOSCOPY;  Surgeon: Rogene Houston, MD;  Location: [facility]  SUITE;  Service: Endoscopy;  Laterality: N/A;  1200  . Ct guided biopsy  01/16/12  . Givens capsule study  01/29/2012    Procedure: GIVENS CAPSULE STUDY;  Surgeon: Rogene Houston, MD;  Location: [facility] END[facility] SUITE;  Service: Endoscopy;  Laterality: N/A;  730  . Laparotomy  03/01/2012    Procedure: EXPLORATORY LAPAROTOMY;  Surgeon: Earnstine Regal, MD;  Location: [facility] ORS;  Service: General;  Laterality: N/A;  Exploratory Laparotomy ,Resection Retroperitoneal Mass  . Cardiac catheterization     Family History  Problem Relation Age of Onset  . Inflammatory bowel disease Maternal Grandmother    History  Substance Use Topics  . Smoking status: Never Smoker   . Smokeless tobacco: Never Used  . Alcohol Use: No    Review of Systems ROS: Statement: All systems negative except as marked or noted in the HPI; Constitutional: Negative for fever and chills. +generalized weakness/fatigue. ; ; Eyes: Negative for eye pain, redness and discharge. ; ; ENMT: Negative for ear pain, hoarseness, nasal congestion, sinus pressure and sore throat. ; ; Cardiovascular: +SOB, palpitations. Negative for chest pain, diaphoresis, and peripheral edema. ; ;  Respiratory: Negative for cough, wheezing and stridor. ; ; Gastrointestinal: Negative for nausea, vomiting, diarrhea, abdominal pain, blood in stool, hematemesis, jaundice and rectal bleeding. . ; ; Genitourinary: Negative for dysuria, flank pain and hematuria. ; ; Musculoskeletal: Negative for back pain and neck pain. Negative for swelling and trauma.; ; Skin: Negative for pruritus, rash, abrasions, blisters, bruising and skin lesion.; ; Neuro: Negative for headache, lightheadedness and neck stiffness. Negative for altered level of consciousness , altered mental status, extremity weakness, paresthesias, involuntary movement, seizure and syncope.      Allergies  Avocado; Macrodantin; Tetracyclines & related; Ampicillin; Biaxin; and Codeine  Home Medications   Prior to  Admission medications   Medication Sig Start Date End Date Taking? Authorizing Provider  ALPRAZolam Duanne Moron) 1 MG tablet Take 1 mg by mouth at bedtime as needed for sleep. HS as needed and can be repeated x 1 if needed. insomnia   Yes Historical Provider, MD  amitriptyline (ELAVIL) 25 MG tablet Take 25 mg by mouth at bedtime.   Yes Historical Provider, MD  apixaban (ELIQUIS) 5 MG TABS tablet Take 1 tablet (5 mg total) by mouth 2 (two) times daily. 04/05/14  Yes Liliane Shi, PA-C  aspirin 81 MG tablet Take 81 mg by mouth daily.   Yes Historical Provider, MD  Calcium Carbonate-Vit D-Min (CALTRATE 600+D PLUS MINERALS PO) Take 1 tablet by mouth 2 (two) times daily.    Yes Historical Provider, MD  HYDROcodone-acetaminophen (NORCO) 10-325 MG per tablet Take 1 tablet by mouth every 6 (six) hours as needed.   Yes Historical Provider, MD  lansoprazole (PREVACID) 30 MG capsule TAKE ONE CAPSULE BY MOUTH TWICE A DAY 01/12/14  Yes Butch Penny, NP  levothyroxine (SYNTHROID, LEVOTHROID) 112 MCG tablet Take 112 mcg by mouth daily before breakfast.    Yes Historical Provider, MD  lidocaine (LIDODERM) 5 % Place 1-3 patches onto the skin daily as needed. Remove & Discard patch within 12 hours or as directed by MD for pain   Yes Historical Provider, MD  lisinopril (PRINIVIL,ZESTRIL) 10 MG tablet Take 10 mg by mouth daily.  04/01/14  Yes Historical Provider, MD  Magnesium 250 MG TABS Take 250 mg by mouth daily. Takes up to twice daily as needed for muscle cramps   Yes Historical Provider, MD  Multiple Vitamin (MULTIVITAMIN WITH MINERALS) TABS Take 1 tablet by mouth daily.   Yes Historical Provider, MD  NON FORMULARY Apply 1 application topically 2 (two) times daily. Nail clear from dermatologist   Yes Historical Provider, MD  Omega-3 Fatty Acids (FISH OIL) 1200 MG CAPS Take 1,200 mg by mouth daily.    Yes Historical Provider, MD  ondansetron (ZOFRAN) 4 MG tablet Take 4 mg by mouth daily as needed for nausea or  vomiting.  03/28/14  Yes Historical Provider, MD  pravastatin (PRAVACHOL) 80 MG tablet Take 40 mg by mouth at bedtime.    Yes Historical Provider, MD  propranolol (INDERAL) 40 MG tablet Take 1 tablet (40 mg total) by mouth 2 (two) times daily. 04/16/14  Yes Herminio Commons, MD  pyridostigmine (MESTINON) 60 MG tablet TAKE 0.5 TABLETS (30 MG TOTAL) BY MOUTH 3 (THREE) TIMES DAILY. 07/28/13  Yes Kathrynn Ducking, MD  SUMAtriptan (IMITREX) 50 MG tablet Take 50 mg by mouth every 2 (two) hours as needed. For migraines   Yes Historical Provider, MD   BP 123/57 mmHg  Pulse 49  Temp(Src) 98.2 F (36.8 C) (Rectal)  Resp 20  Ht 5'  4" (1.626 m)  Wt 189 lb (85.73 kg)  BMI 32.43 kg/m2  SpO2 93%   14:05 Orthostatic Vital Signs JM  Orthostatic Lying  - BP- Lying: 116/47 mmHg ; Pulse- Lying: 45  Orthostatic Sitting - BP- Sitting: 107/68 mmHg ; Pulse- Sitting: 52  Orthostatic Standing at 0 minutes - BP- Standing at 0 minutes: 91/57 mmHg ; Pulse- Standing at 0 minutes: 61    Physical Exam  1320: Physical examination:  Nursing notes reviewed; Vital signs and O2 SAT reviewed;  Constitutional: Well developed, Well nourished, Well hydrated, In no acute distress; Head:  Normocephalic, atraumatic; Eyes: EOMI, PERRL, No scleral icterus; ENMT: Mouth and pharynx normal, Mucous membranes moist; Neck: Supple, Full range of motion, No lymphadenopathy; Cardiovascular: Bradycardic rate and rhythm, No gallop; Respiratory: Breath sounds clear & equal bilaterally, No wheezes.  Speaking full sentences with ease, Normal respiratory effort/excursion; Chest: Nontender, Movement normal; Abdomen: Soft, Nontender, Nondistended, Normal bowel sounds; Genitourinary: No CVA tenderness; Spine:  No midline CS, TS, LS tenderness. No rash.;; Extremities: Pulses normal, No tenderness, No edema, No calf edema or asymmetry.; Neuro: AA&Ox3, Major CN grossly intact.  Speech clear. No gross focal motor or sensory deficits in extremities.; Skin:  Color normal, Warm, Dry.   ED Course  Procedures     EKG Interpretation   Date/Time:  Monday April 27 2014 12:15:44 EST Ventricular Rate:  48 PR Interval:  169 QRS Duration: 116 QT Interval:  441 QTC Calculation: 394 R Axis:   -41 Text Interpretation:  Sinus bradycardia Nonspecific IVCD with LAD Left  ventricular hypertrophy Artifact When compared with ECG of 04/04/2014 Rate  slower Confirmed by [facility]  MD, Krystalyn Kubota [id number]) on 04/27/2014 3:31:09 PM      EKG Interpretation  Date/Time:  Monday April 27 2014 12:15:44 EST Ventricular Rate:  48 PR Interval:  169 QRS Duration: 116 QT Interval:  441 QTC Calculation: 394 R Axis:   -41 Text Interpretation:  Sinus bradycardia Nonspecific IVCD with LAD Left ventricular hypertrophy Artifact When compared with ECG of 04/04/2014 Rate slower Confirmed by [facility]  MD, Nunzio Cory [phone]) on 04/27/2014 3:31:09 PM         MDM  MDM Reviewed: previous chart, nursing note and vitals Reviewed previous: labs, ECG and CT scan Interpretation: labs, ECG and x-ray   Results for orders placed or performed during the hospital encounter of 04/27/14  CBC with Differential  Result Value Ref Range   WBC 5.1 4.0 - 10.5 K/uL   RBC 3.87 3.87 - 5.11 MIL/uL   Hemoglobin 12.6 12.0 - 15.0 g/dL   HCT 38.0 36.0 - 46.0 %   MCV 98.2 78.0 - 100.0 fL   MCH 32.6 26.0 - 34.0 pg   MCHC 33.2 30.0 - 36.0 g/dL   RDW 12.3 11.5 - 15.5 %   Platelets 161 150 - 400 K/uL   Neutrophils Relative % 51 43 - 77 %   Neutro Abs 2.6 1.7 - 7.7 K/uL   Lymphocytes Relative 37 12 - 46 %   Lymphs Abs 1.9 0.7 - 4.0 K/uL   Monocytes Relative 10 3 - 12 %   Monocytes Absolute 0.5 0.1 - 1.0 K/uL   Eosinophils Relative 2 0 - 5 %   Eosinophils Absolute 0.1 0.0 - 0.7 K/uL   Basophils Relative 1 0 - 1 %   Basophils Absolute 0.0 0.0 - 0.1 K/uL  Troponin I  Result Value Ref Range   Troponin I <0.03 <0.031 ng/mL  Basic metabolic panel  Result Value  Ref Range   Sodium 141 135 -  145 mmol/L   Potassium 3.9 3.5 - 5.1 mmol/L   Chloride 110 96 - 112 mmol/L   CO2 23 19 - 32 mmol/L   Glucose, Bld 95 70 - 99 mg/dL   BUN 12 6 - 23 mg/dL   Creatinine, Ser 0.95 0.50 - 1.10 mg/dL   Calcium 10.0 8.4 - 10.5 mg/dL   GFR calc non Af Amer 57 (L) >90 mL/min   GFR calc Af Amer 66 (L) >90 mL/min   Anion gap 8 5 - 15  Brain natriuretic peptide  Result Value Ref Range   B Natriuretic Peptide 83.0 0.0 - 100.0 pg/mL   Dg Chest Portable 1 View 04/27/2014   CLINICAL DATA:  Shortness of breath last night, worse with exertion, COPD, atrial fibrillation  EXAM: PORTABLE CHEST - 1 VIEW  COMPARISON:  04/17/2014, 04/04/2014  FINDINGS: Stable mild hyperinflation. Borderline heart size without superimposed edema or CHF. No focal pneumonia, collapse or consolidation. No effusion or pneumothorax. Trachea midline.  IMPRESSION: Stable hyperinflation without superimposed acute process   Electronically Signed   By: Daryll Brod M.D.   On: 04/27/2014 13:01    1525:  Pt orthostatic on VS; will dose judicious IVF. Pt ambulated with HR 47-58, Sats 98-100% R/A.  Monitor with sustained HR in 40's. D/W pt and family regarding risk/benefit CT-A chest r/o PE, as she is already on anticoagulant; pt prefers to hold CT scan at this time "because I get a lot of them with Oncology." Pt agreeable to try neb treatment for c/o SOB after long d/w her and her daughters. Pt's symptoms multifactorial at this time: bradycardic, orthostatic, possible need for regular neb tx. T/C to [facility] Dr. Domenic Polite, case discussed, including:  HPI, pertinent PM/SHx, VS/PE, dx testing, ED course and treatment:  Agrees with ED workup and pt's SOB likely multifactorial, OK to decrease her inderal by 1/2 again, he can consult prn.  T/C to [facility] Dr. Marily Memos, case discussed, including:  HPI, pertinent PM/SHx, VS/PE, dx testing, ED course and treatment:  Agreeable to admit, requests to write temporary orders, obtain tele bed to team  APAdmits.    Francine Graven, DO 04/29/14 2035

## 2014-04-27 NOTE — H&P (Addendum)
[facility] History and Physical  [name] MEQ:[id number] DOB: 09-22-1940 DOA: 04/27/2014  Referring physician: Dr Thurnell Garbe - [facility] PCP: Delphina Cahill, MD   Chief Complaint: SOB  HPI: [name] is a 74 y.o. female  SOB started yesterday. Associated w/ irregular heart beat, dizziness and weakness. Typically very active, but has felt very tired since discharge from [facility] after admission for Afib. Family reports buying an O2 sat machine and O2 sats as low as 50% and HR 80s last night. Slept well. SOB continued this morning w/ activity and talking only. Resting in bed w/ improvement. Home O2 sat in the 88-89 range. This morning also developed heaviness in chest w/ radiation to the shoulder. Associated w/ nausea. Deneis LE swelling.   Significantnly decreased po over last several weeks.   Started on Eliquis at last admission  Dx w/ COPD 5 years ago based on PFTs in PCPs office but has never been treated  Improved w/ neb  Review of Systems:  Constitutional:  No night sweats, Fevers, chills HEENT:  No headaches, Difficulty swallowing,Tooth/dental problems,Sore throat,  No sneezing, itching, ear ache, nasal congestion, post nasal drip,  Cardio-vascular:  Per HPI GI:  No abdominal pain, nausea, vomiting, diarrhea, change in bowel habits.  Resp:  Per HPI Skin:  no rash or lesions.  GU:  no dysuria, change in color of urine, no urgency or frequency. No flank pain.  Musculoskeletal:   No joint swelling. No decreased range of motion. No back pain.  Psych:  No change in mood or affect. No depression or anxiety. No memory loss.   Past Medical History  Diagnosis Date  . Unspecified essential hypertension   . Mixed hyperlipidemia   . Obesity, unspecified   . Esophageal reflux   . Hypertension     over 10 yrs  . Hypothyroidism   . Migraines   . Osteoarthritis of knee     both knees  . IBS (irritable colon syndrome)   . Ocular myasthenia gravis   . Complication of anesthesia      Low blood pressure, heart rate, O2 sat  . PAF (paroxysmal atrial fibrillation)   . History of pneumonia     hx of   . Cancer     hx of skin cancer   . Anemia   . Hx of echocardiogram     a. Echo (03/2014): Moderate LVH, EF 55-60%, normal wall motion, grade 1 diastolic dysfunction, mild MR, moderate LAE, mild RAE, mild TR, PASP 31 mmHg  . Diverticulosis   . Chronic back pain   . Chronic nausea   . 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 Chronic joint pain   . Depression with anxiety 04/27/2014   Past Surgical History  Procedure Laterality Date  . Knee surgery    . Total knee arthroplasty      both knee. 2005 1st, 2011 the last one  . Abdominal hysterectomy    . Cholecystectomy    . Nasal sinus surgery      30 yrs ago  . Colonoscopy  08/24/2011    Procedure: COLONOSCOPY;  Surgeon: Rogene Houston, MD;  [facility];  Service: Endoscopy;  Laterality: N/A;  1200  . Ct guided biopsy  01/16/12  . Givens capsule study  01/29/2012    Procedure: GIVENS CAPSULE STUDY;  Surgeon: Rogene Houston, MD;  [facility]: [facility];  Service: Endoscopy;  Laterality: N/A;  730  . Laparotomy  03/01/2012  Procedure: EXPLORATORY LAPAROTOMY;  Surgeon: Earnstine Regal, MD;  Location: [facility];  Service: General;  Laterality: N/A;  Exploratory Laparotomy ,Resection Retroperitoneal Mass  . Cardiac catheterization     Social History:  reports that she has never smoked. She has never used smokeless tobacco. She reports that she does not drink alcohol or use illicit drugs.  Allergies  Allergen Reactions  . Avocado Diarrhea and Nausea And Vomiting  . Macrodantin [Nitrofurantoin Macrocrystal] Nausea And Vomiting  . Tetracyclines & Related Nausea And Vomiting  . Ampicillin Rash  . Biaxin [Clarithromycin] Rash  . Codeine Itching    Can not take Codeine unless in cough syrup    Family History  Problem Relation Age of Onset  . Inflammatory bowel disease Maternal  Grandmother      Prior to Admission medications   Medication Sig Start Date End Date Taking? Authorizing Provider  ALPRAZolam Duanne Moron) 1 MG tablet Take 1 mg by mouth at bedtime as needed for sleep. HS as needed and can be repeated x 1 if needed. insomnia   Yes Historical Provider, MD  amitriptyline (ELAVIL) 25 MG tablet Take 25 mg by mouth at bedtime.   Yes Historical Provider, MD  apixaban (ELIQUIS) 5 MG TABS tablet Take 1 tablet (5 mg total) by mouth 2 (two) times daily. 04/05/14  Yes Liliane Shi, PA-C  aspirin 81 MG tablet Take 81 mg by mouth daily.   Yes Historical Provider, MD  Calcium Carbonate-Vit D-Min (CALTRATE 600+D PLUS MINERALS PO) Take 1 tablet by mouth 2 (two) times daily.    Yes Historical Provider, MD  HYDROcodone-acetaminophen (NORCO) 10-325 MG per tablet Take 1 tablet by mouth every 6 (six) hours as needed.   Yes Historical Provider, MD  lansoprazole (PREVACID) 30 MG capsule TAKE ONE CAPSULE BY MOUTH TWICE A DAY 01/12/14  Yes Butch Penny, NP  levothyroxine (SYNTHROID, LEVOTHROID) 112 MCG tablet Take 112 mcg by mouth daily before breakfast.    Yes Historical Provider, MD  lidocaine (LIDODERM) 5 % Place 1-3 patches onto the skin daily as needed. Remove & Discard patch within 12 hours or as directed by MD for pain   Yes Historical Provider, MD  lisinopril (PRINIVIL,ZESTRIL) 10 MG tablet Take 10 mg by mouth daily.  04/01/14  Yes Historical Provider, MD  Magnesium 250 MG TABS Take 250 mg by mouth daily. Takes up to twice daily as needed for muscle cramps   Yes Historical Provider, MD  Multiple Vitamin (MULTIVITAMIN WITH MINERALS) TABS Take 1 tablet by mouth daily.   Yes Historical Provider, MD  NON FORMULARY Apply 1 application topically 2 (two) times daily. Nail clear from dermatologist   Yes Historical Provider, MD  Omega-3 Fatty Acids (FISH OIL) 1200 MG CAPS Take 1,200 mg by mouth daily.    Yes Historical Provider, MD  ondansetron (ZOFRAN) 4 MG tablet Take 4 mg by mouth daily as  needed for nausea or vomiting.  03/28/14  Yes Historical Provider, MD  pravastatin (PRAVACHOL) 80 MG tablet Take 40 mg by mouth at bedtime.    Yes Historical Provider, MD  propranolol (INDERAL) 40 MG tablet Take 1 tablet (40 mg total) by mouth 2 (two) times daily. 04/16/14  Yes Herminio Commons, MD  pyridostigmine (MESTINON) 60 MG tablet TAKE 0.5 TABLETS (30 MG TOTAL) BY MOUTH 3 (THREE) TIMES DAILY. 07/28/13  Yes Kathrynn Ducking, MD  SUMAtriptan (IMITREX) 50 MG tablet Take 50 mg by mouth every 2 (two) hours as needed. For migraines  Yes Historical Provider, MD   Physical Exam: Filed Vitals:   04/27/14 1530 04/27/14 1655 04/27/14 1711 04/27/14 1712  BP: 118/56  122/60 104/80  Pulse: 52  58 58  Temp:      TempSrc:      Resp: 17  16 20   Height:      Weight:      SpO2: 100% 100% 100% 100%    Wt Readings from Last 3 Encounters:  04/27/14 85.73 kg (189 lb)  04/20/14 85.911 kg (189 lb 6.4 oz)  04/16/14 87.091 kg (192 lb)    General:  Appears calm and comfortable Eyes:  PERRL, normal lids, irises & conjunctiva ENT: dry mm,  Neck:  no LAD, masses or thyromegaly Cardiovascular:  Faint heart sounds, RRR, no m/r/g. Trace LE edema Telemetry:  SR, no arrhythmias  Respiratory:  CTA bilaterally, no w/r/r. Normal respiratory effort. Abdomen:  soft, ntnd Skin:  no rash or induration seen on limited exam Musculoskeletal:  grossly normal tone BUE/BLE Psychiatric:  grossly normal mood and affect, speech fluent and appropriate Neurologic:  grossly non-focal.          Labs on Admission:  Basic Metabolic Panel:  Recent Labs Lab 04/27/14 1253  NA 141  K 3.9  CL 110  CO2 23  GLUCOSE 95  BUN 12  CREATININE 0.95  CALCIUM 10.0   Liver Function Tests: No results for input(s): AST, ALT, ALKPHOS, BILITOT, PROT, ALBUMIN in the last 168 hours. No results for input(s): LIPASE, AMYLASE in the last 168 hours. No results for input(s): AMMONIA in the last 168 hours. CBC:  Recent Labs Lab  04/27/14 1253  WBC 5.1  NEUTROABS 2.6  HGB 12.6  HCT 38.0  MCV 98.2  PLT 161   Cardiac Enzymes:  Recent Labs Lab 04/27/14 1253  TROPONINI <0.03    BNP (last 3 results)  Recent Labs  04/04/14 1154 04/27/14 1253  BNP 229.4* 83.0    ProBNP (last 3 results) No results for input(s): PROBNP in the last 8760 hours.  CBG: No results for input(s): GLUCAP in the last 168 hours.  Radiological Exams on Admission: Dg Chest Portable 1 View  04/27/2014   CLINICAL DATA:  Shortness of breath last night, worse with exertion, COPD, atrial fibrillation  EXAM: PORTABLE CHEST - 1 VIEW  COMPARISON:  04/17/2014, 04/04/2014  FINDINGS: Stable mild hyperinflation. Borderline heart size without superimposed edema or CHF. No focal pneumonia, collapse or consolidation. No effusion or pneumothorax. Trachea midline.  IMPRESSION: Stable hyperinflation without superimposed acute process   Electronically Signed   By: Daryll Brod M.D.   On: 04/27/2014 13:01    EKG: Independently reviewed. Sinus. Loletha Grayer, no sign of ACS or Afib. L axis deviation.   Assessment/Plan Principal Problem:   Chest pain Active Problems:   Hypertension   Hypothyroidism   High cholesterol   GERD (gastroesophageal reflux disease)   Neoplasm of uncertain behavior of retroperitoneum, paraganglioma   Sinus bradycardia   Paroxysmal atrial fibrillation   Physical deconditioning   Ocular myasthenia   Depression with anxiety   Chronic diastolic CHF (congestive heart failure)  Chest pain: Cardiac vs GI vs Pleuritic. Pt w/ significant cardiac risk factors including age, HTN, HLD, but w/ recent Myoview on 04/08/14 showing EF 64% and low-risk stress findings. Troponin neg. EKG w/o ACS. Recent Dx of Afib. Ed consulted Dr. Domenic Polite who agrees w/ Admission and will follow. - Admit to tele - cycle troponin - EKg in am - GI cocktail  Paroxysmal atrial  fibrillation: Normal sinus at time of presentation. Majority of patient's symptoms  likely coming from intermittent A. Fib.  - Continue Eliquis and bblocker - Tele  SOB: likely from Afib as pt reports irregular beats during episodes. Complicated w/ underlying component of untreated COPD (based on one time PFTs from several years ago). No sign of acute failure, PE (on Eliquis), PNA, CHF. No sign of hypoxia at time of presentation to ED.  - Monitor O2 - Duonebs Q4 PRN - outpt PFTs for confirmation  Physical deconditioning: pt physical activity and strength worsening since last admission. Poor PO during this time. 3LB wt loss over past 3 wks. Orthostatisis on presentation likely in part due to poor nutrition and fluid intake - Nutrition cosult - Ensure TID BM - PT/OT  Malignant paraganglioma: in remission. Followe by Dr. Tressie Stalker. Diagnosed in December 2013. PET scan on 04/17/14 w/o recurrent disease. - f/u Outpt  UA: Sample concerning for possible UTI but contaminated sample and asymptomatic.  - UCX  GERD: - PPI  Chronic diastolic heart failure: EF 55-60% and Grade 1 diastolic dyfunction (EKG 9163). No sign of acute exacerbation - continue bblocker and ACE. - Monitor  Bradycardia: Patient baseline heart rate around 60. Hold evening dose of propranolol with continuation twice a day starting on 04/28/2014  HTN: Normotensive on presentation. -Continue home lisinopril, and propranolol. - Hold home propranolol secondary to bradycardia - NS 152ml/hr   Chronic pain:  - continue home Norco  Hypothyroid: - continue home synthroid - recommend outpt TSH and free T4  Occular Myasthenia gravis: - continue home Mestinon  Depession/Anxiety: -Continue home amitriptyline - continue Xanax     Code Status: FULL DVT Prophylaxis: Eliquis Family Communication: Daughter and grandaughter Disposition Plan: pending improvoement  MERRELL, DAVID Lenna Sciara, MD Family Medicine [facility] [email or web address] Password [email or web address]

## 2014-04-27 NOTE — ED Notes (Signed)
Pt ambulated well to restroom, HR 47-58 O2 98-100%

## 2014-04-27 NOTE — Telephone Encounter (Signed)
Daughter called to say mother had sudden increase in SOB last night.they went to [facility] and bought a pulse oximeter and said readings have been 88-89 % SaO2.Denies weight gain, daughter thinks her mom went back into Afib except pulse ox reading HR 50.I encouraged her to take her to [facility] ED for evaluation   Will Valarie Merino

## 2014-04-28 ENCOUNTER — Encounter (HOSPITAL_COMMUNITY): Payer: Self-pay | Admitting: Internal Medicine

## 2014-04-28 DIAGNOSIS — I10 Essential (primary) hypertension: Secondary | ICD-10-CM | POA: Diagnosis not present

## 2014-04-28 DIAGNOSIS — N390 Urinary tract infection, site not specified: Secondary | ICD-10-CM | POA: Diagnosis present

## 2014-04-28 DIAGNOSIS — R001 Bradycardia, unspecified: Secondary | ICD-10-CM

## 2014-04-28 DIAGNOSIS — R0602 Shortness of breath: Secondary | ICD-10-CM | POA: Diagnosis not present

## 2014-04-28 DIAGNOSIS — I951 Orthostatic hypotension: Secondary | ICD-10-CM

## 2014-04-28 DIAGNOSIS — R0789 Other chest pain: Secondary | ICD-10-CM | POA: Diagnosis not present

## 2014-04-28 DIAGNOSIS — I480 Paroxysmal atrial fibrillation: Secondary | ICD-10-CM | POA: Diagnosis not present

## 2014-04-28 DIAGNOSIS — D696 Thrombocytopenia, unspecified: Secondary | ICD-10-CM | POA: Diagnosis present

## 2014-04-28 DIAGNOSIS — N3000 Acute cystitis without hematuria: Secondary | ICD-10-CM | POA: Diagnosis not present

## 2014-04-28 DIAGNOSIS — E440 Moderate protein-calorie malnutrition: Secondary | ICD-10-CM | POA: Diagnosis present

## 2014-04-28 LAB — COMPREHENSIVE METABOLIC PANEL
ALT: 12 U/L (ref 0–35)
AST: 16 U/L (ref 0–37)
Albumin: 3 g/dL — ABNORMAL LOW (ref 3.5–5.2)
Alkaline Phosphatase: 46 U/L (ref 39–117)
Anion gap: 3 — ABNORMAL LOW (ref 5–15)
BUN: 10 mg/dL (ref 6–23)
CO2: 26 mmol/L (ref 19–32)
Calcium: 8.8 mg/dL (ref 8.4–10.5)
Chloride: 113 mmol/L — ABNORMAL HIGH (ref 96–112)
Creatinine, Ser: 0.96 mg/dL (ref 0.50–1.10)
GFR calc Af Amer: 65 mL/min — ABNORMAL LOW (ref >90)
GFR calc non Af Amer: 56 mL/min — ABNORMAL LOW (ref >90)
Glucose, Bld: 94 mg/dL (ref 70–99)
Potassium: 4 mmol/L (ref 3.5–5.1)
SODIUM: 142 mmol/L (ref 135–145)
TOTAL PROTEIN: 5.7 g/dL — AB (ref 6.0–8.3)
Total Bilirubin: 0.8 mg/dL (ref 0.3–1.2)

## 2014-04-28 LAB — CBC
HCT: 34.8 % — ABNORMAL LOW (ref 36.0–46.0)
Hemoglobin: 11.5 g/dL — ABNORMAL LOW (ref 12.0–15.0)
MCH: 32.9 pg (ref 26.0–34.0)
MCHC: 33 g/dL (ref 30.0–36.0)
MCV: 99.4 fL (ref 78.0–100.0)
PLATELETS: 123 10*3/uL — AB (ref 150–400)
RBC: 3.5 MIL/uL — ABNORMAL LOW (ref 3.87–5.11)
RDW: 13 % (ref 11.5–15.5)
WBC: 3.9 10*3/uL — ABNORMAL LOW (ref 4.0–10.5)

## 2014-04-28 LAB — VITAMIN B12: Vitamin B-12: 915 pg/mL — ABNORMAL HIGH (ref 211–911)

## 2014-04-28 LAB — T4, FREE: Free T4: 1.64 ng/dL (ref 0.80–1.80)

## 2014-04-28 LAB — TSH: TSH: 0.09 u[IU]/mL — AB (ref 0.350–4.500)

## 2014-04-28 MED ORDER — LISINOPRIL 5 MG PO TABS: 5.0000 mg | ORAL_TABLET | Freq: Every day | ORAL | Status: DC

## 2014-04-28 MED ORDER — CIPROFLOXACIN HCL 250 MG PO TABS
500.0000 mg | ORAL_TABLET | Freq: Two times a day (BID) | ORAL | Status: DC
  Administered 2014-04-28: 500 mg via ORAL
  Filled 2014-04-28: qty 2

## 2014-04-28 MED ORDER — PROPRANOLOL HCL 20 MG PO TABS: 20.0000 mg | ORAL_TABLET | Freq: Two times a day (BID) | ORAL | Status: DC

## 2014-04-28 MED ORDER — PROPRANOLOL HCL 20 MG PO TABS
20.0000 mg | ORAL_TABLET | Freq: Two times a day (BID) | ORAL | Status: DC
  Administered 2014-04-28: 20 mg via ORAL

## 2014-04-28 MED ORDER — CIPROFLOXACIN HCL 500 MG PO TABS: 500.0000 mg | ORAL_TABLET | Freq: Two times a day (BID) | ORAL | Status: DC

## 2014-04-28 MED ORDER — ENSURE COMPLETE PO LIQD: 237.0000 mL | Freq: Two times a day (BID) | ORAL | Status: DC

## 2014-04-28 NOTE — Progress Notes (Signed)
INITIAL NUTRITION ASSESSMENT Pt meets criteria for Moderate MALNUTRITION in the context of Acute as evidenced by loss of 1-2% bw in 1 week and <75% intake compared to estimated needs for > 7 days.  DOCUMENTATION CODES Per approved criteria  -Non-severe (moderate) malnutrition in the context of acute illness or injury   INTERVENTION: - Encourage PO intake  - Ensure BID  NUTRITION DIAGNOSIS: Inadequate oral intake related to loss of appetite and nausea as evidenced by a reported loss of 3 pounds over the last 3 weeks  Goal: Pt to meet >/= 90% of their estimated nutrition needs   Monitor:  Oral Intake, gi distress, weight   Reason for Assessment: Assessment of nutritional status  74 y.o. female  Admitting Dx: Chest pain  ASSESSMENT: 74 y.o. female with SOB that started yesterday. Associated w/ irregular heart beat, dizziness and weakness. Reports Significantnly decreased po over last several weeks.  Spoke with pt who stated that she has not had an appetite. She has had to force herself to eat. She has had nausea and some pain in her chest pain for years now, but it has been worse over the past couple weeks and because of this she has had no appetite. She says her weight has been around 190 for a while (2 years) and she has tried to lose weight in the past.   Pt declined supplements right now and was excited about what she was going to have for lunch which would indicate her appetite has somewhat returned.   Nutrition Focused Physical Exam:  Subcutaneous Fat:  Orbital Region: none Upper Arm Region:none Thoracic and Lumbar Region: none  Muscle:  Temple Region: mild Clavicle Bone Region: mild Clavicle and Acromion Bone Region: mild Scapular Bone Region:n/a Dorsal Hand: none-mild Patellar Region: none Anterior Thigh Region: none Posterior Calf Region: none  Edema: none   Height: Ht Readings from Last 1 Encounters:  04/27/14 5\' 4"  (1.626 m)    Weight: Wt Readings  from Last 1 Encounters:  04/27/14 190 lb (86.183 kg)    Ideal Body Weight: 120  % Ideal Body Weight: 158%  Wt Readings from Last 10 Encounters:  04/27/14 190 lb (86.183 kg)  04/20/14 189 lb 6.4 oz (85.911 kg)  04/16/14 192 lb (87.091 kg)  04/05/14 191 lb (86.637 kg)  04/01/14 192 lb (87.091 kg)  01/26/14 199 lb 3.2 oz (90.357 kg)  01/13/14 197 lb (89.359 kg)  06/10/13 191 lb 12.8 oz (87 kg)  01/14/13 195 lb 14.4 oz (88.86 kg)  12/09/12 192 lb 8 oz (87.317 kg)    Usual Body Weight: 190  % Usual Body Weight: 100%  BMI:  Body mass index is 32.6 kg/(m^2).  Estimated Nutritional Needs: Kcal: 1450-1700 (17-20 kcals/kg) Protein: >65 g (1.2 ibw) Fluid: 1450-1700  Skin: Intact, few scars/moles  Diet Order: Diet regular  EDUCATION NEEDS: -No education needs identified at this time   Intake/Output Summary (Last 24 hours) at 04/28/14 1025 Last data filed at 04/28/14 0900  Gross per 24 hour  Intake   1165 ml  Output   1900 ml  Net   -735 ml    Last BM: 2/7   Labs:   Recent Labs Lab 04/27/14 1253 04/28/14 0652  NA 141 142  K 3.9 4.0  CL 110 113*  CO2 23 26  BUN 12 10  CREATININE 0.95 0.96  CALCIUM 10.0 8.8  GLUCOSE 95 94    CBG (last 3)  No results for input(s): GLUCAP in the last  72 hours.  Scheduled Meds: . amitriptyline  25 mg Oral QHS  . apixaban  5 mg Oral BID  . feeding supplement (ENSURE COMPLETE)  237 mL Oral BID BM  . feeding supplement (ENSURE)  1 Container Oral TID BM  . feeding supplement (RESOURCE BREEZE)  1 Container Oral TID BM  . levothyroxine  112 mcg Oral QAC breakfast  . lisinopril  5 mg Oral Daily  . pantoprazole  40 mg Oral Daily  . pravastatin  40 mg Oral QHS  . propranolol  20 mg Oral BID  . pyridostigmine  30 mg Oral 3 times per day  . sodium chloride  3 mL Intravenous Q12H    Continuous Infusions: . sodium chloride 75 mL/hr at 04/27/14 1520  . sodium chloride 100 mL/hr at 04/28/14 2409    Past Medical History   Diagnosis Date  . Essential hypertension   . Mixed hyperlipidemia   . Obesity   . GERD (gastroesophageal reflux disease)   . Hypothyroidism   . Migraines   . Osteoarthritis of knee   . IBS (irritable colon syndrome)   . Ocular myasthenia gravis   . Complication of anesthesia     Low blood pressure, heart rate, O2 sat  . PAF (paroxysmal atrial fibrillation)   . History of pneumonia   . Skin cancer   . Anemia   . Diverticulosis   . Chronic back pain   . Chronic nausea   . Paraganglioma     Resection 02/2012 (retroperitoneal)  . Depression with anxiety     Past Surgical History  Procedure Laterality Date  . Knee surgery    . Total knee arthroplasty Bilateral     2005 and 2011  . Abdominal hysterectomy    . Cholecystectomy    . Nasal sinus surgery      30 yrs ago  . Colonoscopy  08/24/2011    Procedure: COLONOSCOPY;  Surgeon: Rogene Houston, MD;  [facility];  Service: Endoscopy;  Laterality: N/A;  1200  . Ct guided biopsy  01/16/12  . Givens capsule study  01/29/2012    Procedure: GIVENS CAPSULE STUDY;  Surgeon: Rogene Houston, MD;  [facility];  Service: Endoscopy;  Laterality: N/A;  730  . Laparotomy  03/01/2012    Procedure: EXPLORATORY LAPAROTOMY;  Surgeon: Earnstine Regal, MD;  Location: [facility];  Service: General;  Laterality: N/A;  Exploratory Laparotomy ,Resection Retroperitoneal Mass  . Cardiac catheterization      Burtis Junes RD, LDN Nutrition Pager: [phone] 04/28/2014 10:25 AM

## 2014-04-28 NOTE — Progress Notes (Signed)
UR completed 

## 2014-04-28 NOTE — Evaluation (Signed)
Physical Therapy Evaluation Patient Details Name: [name] MRN: [id number] DOB: 1940-06-07 Today's Date: 04/28/2014   History of Present Illness  Pt is admitted with chest pain and is here for observation.  She reports a gradual decline in general deconditioning over the past 5 weeks and is now concerned about her O2 requirements.  Pt is a retired Therapist, sports.  Clinical Impression   Pt was seen for evaluation.  She reports feeling stronger but is worried about her O2 sats on room air. She has had bilateral TKR and uses a walker for carrying towels when going to the [facility] but otherwise is independent at home.  Her strength and balance are WNL.  O2 sat on room air at rest=98%.  She was able to ambulate 300' on level and slope with a stable gait pattern, no assistive device.  O2 sat with gait=98% on room air.  She had no symptoms of chest pain or dizziness.  HR increased from mid 60s at rest to 72 with gait.  No further PT should be needed.    Follow Up Recommendations No PT follow up    Equipment Recommendations  None recommended by PT    Recommendations for Other Services   none    Precautions / Restrictions Precautions Precautions: None Precaution Comments: Hx of falls Restrictions Weight Bearing Restrictions: No      Mobility  Bed Mobility Overal bed mobility: Independent                Transfers Overall transfer level: Independent                  Ambulation/Gait Ambulation/Gait assistance: Independent Ambulation Distance (Feet): 300 Feet Assistive device: None Gait Pattern/deviations: WFL(Within Functional Limits)   Gait velocity interpretation: at or above normal speed for age/gender General Gait Details: gait was initially slower than that to be expected but she was able to pick up the pace with no dyspnea.Marland KitchenMarland KitchenO2 sat on room air=98%...gait also on slope with no instability  Stairs            Wheelchair Mobility    Modified Rankin (Stroke Patients  Only)       Balance Overall balance assessment: Modified Independent                                           Pertinent Vitals/Pain Pain Assessment: No/denies pain    Home Living Family/patient expects to be discharged to:: Private residence Living Arrangements: Other relatives Available Help at Discharge: Family;Available PRN/intermittently Type of Home: House Home Access: Stairs to enter   Entrance Stairs-Number of Steps: 1 Home Layout: One level Home Equipment: Grab bars - tub/shower;Walker - 4 wheels;Shower seat - built in Additional Comments: uses 4 wheeled walker for long distances [facility]    Prior Function Level of Independence: Independent               Hand Dominance   Dominant Hand: Right    Extremity/Trunk Assessment   Upper Extremity Assessment: Defer to OT evaluation           Lower Extremity Assessment: Overall WFL for tasks assessed         Communication   Communication: No difficulties  Cognition Arousal/Alertness: Awake/alert Behavior During Therapy: WFL for tasks assessed/performed Overall Cognitive Status: Within Functional Limits for tasks assessed  General Comments      Exercises        Assessment/Plan    PT Assessment Patent does not need any further PT services  PT Diagnosis     PT Problem List    PT Treatment Interventions     PT Goals (Current goals can be found in the Care Plan section) Acute Rehab PT Goals PT Goal Formulation: All assessment and education complete, DC therapy    Frequency     Barriers to discharge  none      Co-evaluation               End of Session Equipment Utilized During Treatment: Gait belt Activity Tolerance: Patient tolerated treatment well Patient left: in bed;with call bell/phone within reach;with family/visitor present      Functional Assessment Tool Used: clinical judgement Functional Limitation: Mobility:  Walking and moving around Mobility: Walking and Moving Around Current Status [phone]): 0 percent impaired, limited or restricted Mobility: Walking and Moving Around Goal Status [phone]): 0 percent impaired, limited or restricted Mobility: Walking and Moving Around Discharge Status [phone]): 0 percent impaired, limited or restricted    Time: 1005-1028 PT Time Calculation (min) (ACUTE ONLY): 23 min   Charges:   PT Evaluation $Initial PT Evaluation Tier I: 1 Procedure     PT G Codes:   PT G-Codes **NOT FOR INPATIENT CLASS** Functional Assessment Tool Used: clinical judgement Functional Limitation: Mobility: Walking and moving around Mobility: Walking and Moving Around Current Status ([phone]): 0 percent impaired, limited or restricted Mobility: Walking and Moving Around Goal Status ([phone]): 0 percent impaired, limited or restricted Mobility: Walking and Moving Around Discharge Status ([phone]): 0 percent impaired, limited or restricted    Demetrios Isaacs L 04/28/2014, 10:35 AM

## 2014-04-28 NOTE — Consult Note (Signed)
CARDIOLOGY CONSULT NOTE   Patient ID: [name] MRN: [id number] DOB/AGE: 06-04-1940 74 y.o.  Admit Date: 04/27/2014 Referring Physician: PTH Primary Physician: Delphina Cahill, MD Consulting Cardiologist: Rozann Lesches MD Primary Cardiologist: Kate Sable MD Reason for Consultation: Chest Pain  Clinical Summary [name] is a 74 y.o.female with known history of paroxysmal atrial fibrillation, recently discharged from [facility] in 04/05/2014 after admission for PAF and hypertension. She had OP stress myoview that was low risk. Eliquis was initiated with a CHADS VASC 4 and she was continued on inderal (also for migraines).     On follow up office visit with Dr. [name] 04/16/2014,  she was found to be bradycardic with resting HR of 44 bpm. Propanolol was decreased to 40 mg BID from TID. She had a follow up appointment with Dr. Laural Golden on 04/20/2014 with HR of 56 bpm at rest. Daughter called our office on 04/27/2014 due to the patients complaints of sudden shortness of breath and decreased O2 Sat of 88-89% and HR of 50 bpm. She was advised to go to ER by Wilber Oliphant, RN.          On arrival to ER, BP 113/93, HR 52 bpm. O2 Sat 99% on RA. Troponin negative X1 Labs essentially unremarkable. CXR demonstrated stable hyperinflation without superimposed acute process. EKG sinus bradycardia with mild LVH,. Rate of 48 bpm. She was also mildly orthostatic. Dr. Raina Mina spoke with Dr. Domenic Polite by phone who advised that she receive IV fluids and to decrease propanolol dose again. Evening dose was held. She was subsequently restarted on propanolol 40 mg BID on admission.       She states that her O2 sats drop when she gets up and walks around, and that it was not sudden. She states that she has had chronic nausea and wishes to be progressed to normal diet from clear liquids. She had her family bring home medications that were not prescribed on admission to include hydrocodone, ASA 81, pyridostigmine, and PPI.  She  is currently asymptomatic and wants to go home.   Allergies  Allergen Reactions  . Avocado Diarrhea and Nausea And Vomiting  . Macrodantin [Nitrofurantoin Macrocrystal] Nausea And Vomiting  . Tetracyclines & Related Nausea And Vomiting  . Ampicillin Rash  . Biaxin [Clarithromycin] Rash  . Codeine Itching    Can not take Codeine unless in cough syrup    Medications Scheduled Medications: . sodium chloride   Intravenous STAT  . amitriptyline  25 mg Oral QHS  . apixaban  5 mg Oral BID  . feeding supplement (ENSURE COMPLETE)  237 mL Oral BID BM  . feeding supplement (ENSURE)  1 Container Oral TID BM  . feeding supplement (RESOURCE BREEZE)  1 Container Oral TID BM  . levothyroxine  112 mcg Oral QAC breakfast  . lisinopril  10 mg Oral Daily  . pantoprazole  40 mg Oral Daily  . pravastatin  40 mg Oral QHS  . propranolol  20 mg Oral BID  . pyridostigmine  30 mg Oral 3 times per day  . sodium chloride  3 mL Intravenous Q12H    Infusions: . sodium chloride 75 mL/hr at 04/27/14 1520  . sodium chloride 100 mL/hr at 04/28/14 0647    PRN Medications: acetaminophen, ALPRAZolam, gi cocktail, ipratropium-albuterol, lidocaine, morphine injection, ondansetron **OR** ondansetron (ZOFRAN) IV   Past Medical History  Diagnosis Date  . Essential hypertension   . Mixed hyperlipidemia   . Obesity   . GERD (gastroesophageal reflux disease)   .  Hypothyroidism   . Migraines   . Osteoarthritis of knee   . IBS (irritable colon syndrome)   . Ocular myasthenia gravis   . Complication of anesthesia     Low blood pressure, heart rate, O2 sat  . PAF (paroxysmal atrial fibrillation)   . History of pneumonia   . Skin cancer   . Anemia   . Diverticulosis   . Chronic back pain   . Chronic nausea   . Paraganglioma     Resection 02/2012 (retroperitoneal)  . Depression with anxiety     Past Surgical History  Procedure Laterality Date  . Knee surgery    . Total knee arthroplasty Bilateral      2005 and 2011  . Abdominal hysterectomy    . Cholecystectomy    . Nasal sinus surgery      30 yrs ago  . Colonoscopy  08/24/2011    Procedure: COLONOSCOPY;  Surgeon: Rogene Houston, MD;  Location: [facility];  Service: Endoscopy;  Laterality: N/A;  1200  . Ct guided biopsy  01/16/12  . Givens capsule study  01/29/2012    Procedure: GIVENS CAPSULE STUDY;  Surgeon: Rogene Houston, MD;  Location: [facility];  Service: Endoscopy;  Laterality: N/A;  730  . Laparotomy  03/01/2012    Procedure: EXPLORATORY LAPAROTOMY;  Surgeon: Earnstine Regal, MD;  Location: [facility];  Service: General;  Laterality: N/A;  Exploratory Laparotomy ,Resection Retroperitoneal Mass  . Cardiac catheterization      Family History  Problem Relation Age of Onset  . Inflammatory bowel disease Maternal Grandmother     Social History [name] reports that she has never smoked. She has never used smokeless tobacco. [name] reports that she does not drink alcohol.  Review of Systems Complete review of systems are found to be negative unless outlined in H&P above.  Physical Examination Blood pressure 114/56, pulse 50, temperature 98 F (36.7 C), temperature source Oral, resp. rate 20, height 5\' 4"  (1.626 m), weight 190 lb (86.183 kg), SpO2 98 %.  Intake/Output Summary (Last 24 hours) at 04/28/14 0942 Last data filed at 04/28/14 0650  Gross per 24 hour  Intake    805 ml  Output   1550 ml  Net   -745 ml    Telemetry: Sinus bradycardia rate of 47 bpm.  GEN: No acute distress HEENT: Conjunctiva and lids normal, oropharynx clear with moist mucosa. Neck: Supple, no elevated JVP or carotid bruits, no thyromegaly. Lungs: Clear to auscultation, nonlabored breathing at rest. Cardiac: Regular rate and rhythm, bradycardic no significant systolic murmur, no pericardial rub. Abdomen: Soft, nontender, no hepatomegaly, bowel sounds present, no guarding or rebound. Extremities: No pitting edema, distal pulses 2+. Skin:  Warm and dry. Musculoskeletal: Scoliosis noted.  Neuropsychiatric: Alert and oriented x3, affect grossly appropriate.  Prior Cardiac Testing/Procedures  Lab Results  Basic Metabolic Panel:  Recent Labs Lab 04/27/14 1253 04/28/14 0652  NA 141 142  K 3.9 4.0  CL 110 113*  CO2 23 26  GLUCOSE 95 94  BUN 12 10  CREATININE 0.95 0.96  CALCIUM 10.0 8.8    Liver Function Tests:  Recent Labs Lab 04/28/14 0652  AST 16  ALT 12  ALKPHOS 46  BILITOT 0.8  PROT 5.7*  ALBUMIN 3.0*    CBC:  Recent Labs Lab 04/27/14 1253 04/28/14 0652  WBC 5.1 3.9*  NEUTROABS 2.6  --   HGB 12.6 11.5*  HCT 38.0 34.8*  MCV 98.2 99.4  PLT 161 123*    Cardiac Enzymes:  Recent Labs Lab 04/27/14 1253 04/27/14 1759 04/27/14 2001 04/27/14 2300  TROPONINI <0.03 <0.03 <0.03 <0.03    Radiology: Dg Chest Portable 1 View  04/27/2014   CLINICAL DATA:  Shortness of breath last night, worse with exertion, COPD, atrial fibrillation  EXAM: PORTABLE CHEST - 1 VIEW  COMPARISON:  04/17/2014, 04/04/2014  FINDINGS: Stable mild hyperinflation. Borderline heart size without superimposed edema or CHF. No focal pneumonia, collapse or consolidation. No effusion or pneumothorax. Trachea midline.  IMPRESSION: Stable hyperinflation without superimposed acute process   Electronically Signed   By: Daryll Brod M.D.   On: 04/27/2014 13:01    ECG:  Sinus bradycardia 47 bpm. LVH.   Impression and Recommendations  1. Bradycardia: Heart rate likely related to her propanolol. Will decrease again to 20 mg BID. Start this tonight and skip this am dose due to HR of 47 bpm on telemetry.   2. Hypertension: Low normal. On lisinopril 10 mg daily.  3. PAF: No evidence of this currently. Had some PVC's only. CHADS VASC Score of 4. Continue Eliquis.   4. Dyspnea; Usually occurs with exertion. Plans for PFTs as OP   5. Chronic Nausea: States that she is used to this. Would like to have her diet advanced.   6. Chronic  back pain: On hydrocodone.    Signed: Phill Myron. Lawrence NP Spivey  04/28/2014, 9:42 AM Co-Sign MD   Attending note:  Patient seen and examined. Reviewed records and modified above note by Ms. Lawrence NP. Patient has history of known PAF, on Eliquis and Inderal as an outpatient. She presented to the ER for further evaluation of shortness of breath and reportedly exertional hypoxia as well. Heart rate noted to be in the 40s, chest x-ray without acute process but described stable hyperinflation. Cardiac markers normal arguing against ACS. ECG showed sinus rhythm with leftward axis, no acute ST segment changes. On examination this morning she appears comfortable, lungs exhibit decreased breath sounds but no wheezing, cardiac exam with RRR and no gallop. Follow-up telemetry shows sinus rhythm and sinus bradycardia, no pauses or PAF. She has received a gentle fluids with documentation of mild orthostasis in the ER. Recommendation is to reduce Inderal to 20 mg twice daily. Keep an eye on blood pressure, as lisinopril could also be further reduced to 5 mg daily if needed. Recommend outpatient PFTs for further evaluation, particularly with hyperinflation by chest x-ray and reportedly some documented exertional hypoxia. She already has a follow-up visit scheduled with Dr. [name] on February 23. No further inpatient cardiac testing at this time.  Satira Sark, M.D., F.A.C.C.

## 2014-04-28 NOTE — Evaluation (Signed)
Occupational Therapy Evaluation Patient Details Name: [name] MRN: [id number] DOB: 1940/07/22 Today's Date: 04/28/2014    History of Present Illness SOB started yesterday. Associated w/ irregular heart beat, dizziness and weakness. Typically very active, but has felt very tired since discharge from [facility] after admission for Afib.   Clinical Impression   Patient reports that she has had a poor appetite and has not eaten much lately which I believe is the primary cause of her increased weakness. Patient reports no difficulty completing ADL tasks. Patient does display generalized weakness in BUE but I believe that when she receives the appropriate calorie intake she will have the energy to be mobile and in turn will regain her lost strength. Recommend PT follow up as patient reports a hx of falls.     Follow Up Recommendations  No OT follow up    Equipment Recommendations  None recommended by OT    Recommendations for Other Services PT consult     Precautions / Restrictions Precautions Precautions: Fall Precaution Comments: Hx of falls Restrictions Weight Bearing Restrictions: No              ADL Overall ADL's : At baseline                                                       Pertinent Vitals/Pain Pain Assessment: No/denies pain     Hand Dominance Right   Extremity/Trunk Assessment Upper Extremity Assessment Upper Extremity Assessment: Generalized weakness   Lower Extremity Assessment Lower Extremity Assessment: Defer to PT evaluation       Communication Communication Communication: No difficulties   Cognition Arousal/Alertness: Awake/alert Behavior During Therapy: WFL for tasks assessed/performed Overall Cognitive Status: Within Functional Limits for tasks assessed                                Home Living Family/patient expects to be discharged to:: Private residence Living Arrangements: Other relatives  (granddaughter) Available Help at Discharge: Family;Available PRN/intermittently Type of Home: House Home Access: Stairs to enter [employer] of Steps: 1   Home Layout: One level     Bathroom Shower/Tub: Walk-in shower         Home Equipment: Grab bars - tub/shower;Walker - 4 wheels;Shower seat - built in   Additional Comments: uses 4 wheeled walker for long distances [facility]      Prior Functioning/Environment Level of Independence: Independent with assistive device(s)                                       End of Session Nurse Communication: Other (comment) (to follow up on reason for liquid diet)  Activity Tolerance: Patient tolerated treatment well;Patient limited by fatigue Patient left: in bed;with call bell/phone within reach   Time: 0815-0830 OT Time Calculation (min): 15 min Charges:  OT General Charges $OT Visit: 1 Procedure OT Evaluation $Initial OT Evaluation Tier I: 1 Procedure G-Codes: OT G-codes **NOT FOR INPATIENT CLASS** Functional Assessment Tool Used: clinical judgement Functional Limitation: Self care Self Care Current Status (B5597): At least 1 percent but less than 20 percent impaired, limited or restricted Self Care Goal Status (C1638): At  least 1 percent but less than 20 percent impaired, limited or restricted Self Care Discharge Status [phone]): At least 1 percent but less than 20 percent impaired, limited or restricted  Ailene Ravel, OTR/L,CBIS  [phone]  04/28/2014, 8:58 AM

## 2014-04-28 NOTE — Progress Notes (Signed)
Patient discharged home.   No distress noted.

## 2014-04-28 NOTE — Discharge Summary (Signed)
Physician Discharge Summary  [name]:[id number] DOB: 05-22-1940 DOA: 04/27/2014  PCP: Delphina Cahill, MD  Admit date: 04/27/2014 Discharge date: 04/28/2014  Time spent: 40 minutes  Recommendations for Outpatient Follow-up:  1. Has follow up appointment with Dr Jacinta Shoe 05/12/14. Medications adjusted for HR and BP. Recommend cbc to track platelet count since being on Eliquis.  2. PCP 1-2 weeks to track Hg, platelets and WBC and evaluate need for PFT, monitor nutritional status, evaluate for resolution of UTI  Discharge Diagnoses:  Principal Problem:   Chest pain Active Problems:   Hypertension   Hypothyroidism   High cholesterol   GERD (gastroesophageal reflux disease)   Neoplasm of uncertain behavior of retroperitoneum, paraganglioma   Sinus bradycardia   Paroxysmal atrial fibrillation   Physical deconditioning   Ocular myasthenia   Depression with anxiety   Chronic diastolic CHF (congestive heart failure)   Malnutrition of moderate degree   Thrombocytopenia   UTI (urinary tract infection)   Discharge Condition: stable  Diet recommendation: heart healthy  Filed Weights   04/27/14 1214 04/27/14 1752 04/27/14 1826  Weight: 85.73 kg (189 lb) 85.73 kg (189 lb) 86.183 kg (190 lb)    History of present illness:  [name] is a 74 y.o. female who presented to ED on 04/27/14 with ccSOB x1 day. Associated w/ irregular heart beat, dizziness and weakness. Typically very active, but reported feeling very tired since discharge from [facility] after admission for Afib with RVR discharged 04/05/14. Started on Eliquis at that time.  Family reported buying an O2 sat machine and O2 sats as low as 50% and HR 80s the night before. Slept well. SOB continued in morning w/ activity and talking only. Resting in bed w/ improvement. Home O2 sat in the 88-89 range. In addition she reported a heaviness in chest w/ radiation to the shoulder. Associated w/ nausea. Deneid LE swelling. Reported Decreased po  over last several weeks. Dx w/ COPD 5 years ago based on PFTs in PCPs office but has never been treated. provided with nebs in ED and Improved.   Hospital Course:  Chest pain: Pt w/ significant cardiac risk factors including age, HTN, HLD, but w/ recent Myoview on 04/08/14 showing EF 64% and low-risk stress findings. Troponin neg x3. EKG w/o acute changes. Recent Dx of Afib. Tele with bradycardia in am. Pain resolved. Evaluated by cardiology who ruled out ACS. No further cardiac testing required. Has follow up appointment cardilogy 05/12/14  Paroxismal atrial fibrillation: Recent diagnosis. NSR on presentation. Evaluated by cardiology. Continue Eliquis. EKG with sinus bradycardia at 51.  Sinus bradycardia: TSH and free T4 within limits of normal.  Evaluated by cards who opine likely related to propanolol and  recommended decreasing home propanolol to 20mg  BID. No pauses noted.   Dyspnea: reported desaturation at home. None documented during this hospitalization. May be related to afib as patient reports irregular beats during episodes. No indication of PE or of acute HF.  Chest xray with some hyperinflation suggesting COPD otherwise unremarkable. Provided with nebs in ED and improved.  Pt reports remote PFT's never any therapy. At discharge oxygen saturation level 98% on room air with ambulation. No sob noted. Recommend OP PFT's. Follow up with PCP 1 week for referral.  Thrombocytopenia: more than likely related to Eliquis. Chart review indicates platelets trending downward since starting Eliquis. Patient reports Dr Tressie Stalker has informed her of low platelets.  No s/sx active bleeding. No indication for transfusion.  Will discontinue aspirin. Follow up with  cards 05/12/14. Patient aware and understands need to follow.   UTI: culture pending at discharge. She is afebrile and non-toxic. Reports intermittent symptoms. No leukocytosis. Given multiple medical issues will treat with cipro for 5 days. Follow up  with PCP to evaluate for resolution of UTI  Moderate malnutrition: decreased po intake, mild unintentional weight loss. Evaluated by dietician who recommended complete ensure BID. OP follow up   Physical deconditioning: pt physical activity and strength worsening since last admission. Evaluated by PT who recommends no OP PT or OT needed.   Malignant paraganglioma: in remission. Followe by Dr. Tressie Stalker. Diagnosed in December 2013. PET scan on 04/17/14 w/o recurrent disease.    GERD:stable at baseline  Chronic diastolic heart failure: EF 55-60% and Grade 1 diastolic dyfunction. Compensated.   HTN: controlled. Continue home lisinopril and propanolol with noted adjustments    Chronic pain: stable at baseline  Hypothyroid: TSH and free T4 within limits of normal. Continue home meds  Occular Myasthenia gravis: continue home Mestinon  Depession/Anxiety: stable at baseline.    Procedures:  none  Consultations:  Dr Domenic Polite cardiology  Discharge Exam: Filed Vitals:   04/28/14 1251  BP:   Pulse: 50  Temp: 97.9 F (36.6 C)  Resp: 18    General: well nourished calm cooperative Cardiovascular: RRR no m/g/r no LE edema PPP Respiratory: normal effort BS slightly diminished but clear to auscultation. I hear no wheeze or rhonchi  Discharge Instructions   Discharge Instructions    Diet - low sodium heart healthy    Complete by:  As directed      Increase activity slowly    Complete by:  As directed           Current Discharge Medication List    START taking these medications   Details  ciprofloxacin (CIPRO) 500 MG tablet Take 1 tablet (500 mg total) by mouth 2 (two) times daily. Qty: 9 tablet, Refills: 0    feeding supplement, ENSURE COMPLETE, (ENSURE COMPLETE) LIQD Take 237 mLs by mouth 2 (two) times daily between meals.      CONTINUE these medications which have CHANGED   Details  lisinopril (PRINIVIL,ZESTRIL) 5 MG tablet Take 1 tablet (5 mg total) by mouth  daily. Qty: 30 tablet, Refills: 0    propranolol (INDERAL) 20 MG tablet Take 1 tablet (20 mg total) by mouth 2 (two) times daily. Qty: 60 tablet, Refills: 0      CONTINUE these medications which have NOT CHANGED   Details  ALPRAZolam (XANAX) 1 MG tablet Take 1 mg by mouth at bedtime as needed for sleep. HS as needed and can be repeated x 1 if needed. insomnia   Associated Diagnoses: Abnormal thyroid scan    amitriptyline (ELAVIL) 25 MG tablet Take 25 mg by mouth at bedtime.    apixaban (ELIQUIS) 5 MG TABS tablet Take 1 tablet (5 mg total) by mouth 2 (two) times daily. Qty: 60 tablet, Refills: 11    Calcium Carbonate-Vit D-Min (CALTRATE 600+D PLUS MINERALS PO) Take 1 tablet by mouth 2 (two) times daily.     HYDROcodone-acetaminophen (NORCO) 10-325 MG per tablet Take 1 tablet by mouth every 6 (six) hours as needed.    lansoprazole (PREVACID) 30 MG capsule TAKE ONE CAPSULE BY MOUTH TWICE A DAY Qty: 60 capsule, Refills: 5    levothyroxine (SYNTHROID, LEVOTHROID) 112 MCG tablet Take 112 mcg by mouth daily before breakfast.     lidocaine (LIDODERM) 5 % Place 1-3 patches onto the skin daily  as needed. Remove & Discard patch within 12 hours or as directed by MD for pain    Magnesium 250 MG TABS Take 250 mg by mouth daily. Takes up to twice daily as needed for muscle cramps    Multiple Vitamin (MULTIVITAMIN WITH MINERALS) TABS Take 1 tablet by mouth daily.    NON FORMULARY Apply 1 application topically 2 (two) times daily. Nail clear from dermatologist    Omega-3 Fatty Acids (FISH OIL) 1200 MG CAPS Take 1,200 mg by mouth daily.     ondansetron (ZOFRAN) 4 MG tablet Take 4 mg by mouth daily as needed for nausea or vomiting.     pravastatin (PRAVACHOL) 80 MG tablet Take 40 mg by mouth at bedtime.     pyridostigmine (MESTINON) 60 MG tablet TAKE 0.5 TABLETS (30 MG TOTAL) BY MOUTH 3 (THREE) TIMES DAILY. Qty: 45 tablet, Refills: 0    SUMAtriptan (IMITREX) 50 MG tablet Take 50 mg by mouth  every 2 (two) hours as needed. For migraines      STOP taking these medications     aspirin 81 MG tablet        Allergies  Allergen Reactions  . Avocado Diarrhea and Nausea And Vomiting  . Macrodantin [Nitrofurantoin Macrocrystal] Nausea And Vomiting  . Tetracyclines & Related Nausea And Vomiting  . Ampicillin Rash  . Biaxin [Clarithromycin] Rash  . Codeine Itching    Can not take Codeine unless in cough syrup   Follow-up Information    Follow up with Delphina Cahill, MD On 05/05/2014.   Specialty:  Internal Medicine   Why:  follow need for PFT, follow CBC track WBC and platelets at 11:40 am   Contact information:    [place] [address] [phone]        The results of significant diagnostics from this hospitalization (including imaging, microbiology, ancillary and laboratory) are listed below for reference.    Significant Diagnostic Studies: Dg Chest 2 View  04/04/2014   CLINICAL DATA:  Acute onset of tachycardia and substernal chest pain radiating into the left neck associated with shortness of breath after taking her usual medications earlier today. Prior history of low-grade neuroendocrine neoplasm of the retroperitoneum.  EXAM: CHEST  2 VIEW  COMPARISON:  12/20/2011 dating back to 01/01/2012. CT chest 01/13/2013. PET-CT 01/10/2012. A loop in it  FINDINGS: Cardiomediastinal silhouette unremarkable, unchanged. Lungs clear. Bronchovascular markings normal. Pulmonary vascularity normal. No visible pleural effusions. No pneumothorax. Stable chronic elevation of the right hemidiaphragm. Degenerative changes involving the thoracic spine and visualized upper lumbar spine, unchanged.  IMPRESSION: No acute cardiopulmonary disease.  Stable examination.   Electronically Signed   By: Evangeline Dakin M.D.   On: 04/04/2014 13:33   Dg Abd 1 View  04/04/2014   CLINICAL DATA:  One week history of abdominal pain and constipation. Bloody, mucous stool yesterday. Current history  of irritable bowel syndrome. Prior history of retroperitoneal paraganglioma.  EXAM: ABDOMEN - 1 VIEW  COMPARISON:  CT abdomen and pelvis 01/13/2013, 01/01/2012.  FINDINGS: Bowel gas pattern unremarkable without evidence of obstruction or significant ileus. Moderately large stool burden in the colon. Surgical clips in the left side of the abdomen from prior paraganglioma resection. Surgical clip low in the pelvis shown on the prior CT to be located adjacent to the distal sigmoid colon. No opaque urinary tract calculi. Degenerative changes involving the lower thoracic and lumbar spine with lumbar scoliosis convex left.  IMPRESSION: No acute abdominal abnormality.  Moderately large  stool burden.   Electronically Signed   By: Evangeline Dakin M.D.   On: 04/04/2014 17:56   Nm Myocar Multi W/spect W/wall Motion / Ef  04/08/2014   CLINICAL DATA:  74 year old woman with history of hypertension, hyperlipidemia, previously documented nonobstructive CAD, now presenting with chest pain. This study is requested to evaluate for the presence and extent of ischemia.  EXAM: MYOCARDIAL IMAGING WITH SPECT (REST AND PHARMACOLOGIC-STRESS)  GATED LEFT VENTRICULAR WALL MOTION STUDY  LEFT VENTRICULAR EJECTION FRACTION  TECHNIQUE: Standard myocardial SPECT imaging was performed after resting intravenous injection of 10 mCi Tc-68m sestamibi. Subsequently, intravenous infusion of Lexiscan was performed under the supervision of the Cardiology staff. At peak effect of the drug, 30 mCi Tc-48m sestamibi was injected intravenously and standard myocardial SPECT imaging was performed. Quantitative gated imaging was also performed to evaluate left ventricular wall motion, and estimate left ventricular ejection fraction.  FINDINGS: Baseline tracing shows sinus bradycardia at 41 beats per min with nonspecific T-wave changes. Lexiscan bolus was given in standard fashion. Heart rate increased from 41 beats per min up to 52 beats per min, and blood  pressure increased from 94/61 up to 118/68. No chest pain was reported. There were no diagnostic ST segment changes. Rare PVC noted.  Analysis of the raw perfusion data finds adequate myocardial radiotracer uptake, however with increased gut uptake adjacent to the inferior wall and some degree of breast attenuation.  Perfusion: There is a small, mild intensity, apical anterolateral defect that exhibits partial reversibility. Region suggests small area of ischemia although may also be due to variable breast attenuation.  Wall Motion: Normal left ventricular wall motion. No left ventricular dilation.  Left Ventricular Ejection Fraction: 64 %  End diastolic volume 88 ml  End systolic volume 32 ml  IMPRESSION: 1. Small apical anterolateral defect that represents either mild region of ischemia or variable breast attenuation. No large ischemic burden noted however.  2. Normal left ventricular wall motion.  3. Left ventricular ejection fraction 64%  4. Low-risk stress test findings*.  *2012 Appropriate Use Criteria for Coronary Revascularization Focused Update: J Am Coll Cardiol. 6644;03(4):742-595. http://content.airportbarriers.com.aspx?articleid=1201161   Electronically Signed   By: Rozann Lesches M.D.   On: 04/08/2014 14:52   Nm Pet Image Restag (ps) Skull Base To Thigh  04/17/2014   CLINICAL DATA:  Restaging treatment strategy for periaortic lymphadenopathy.  EXAM: NUCLEAR MEDICINE PET SKULL BASE TO THIGH  TECHNIQUE: 9.9 mCi F-18 FDG was injected intravenously. Full-ring PET imaging was performed from the skull base to thigh after the radiotracer. CT data was obtained and used for attenuation correction and anatomic localization.  FASTING BLOOD GLUCOSE:  Value: 100 mg/dl  COMPARISON:  01/10/2012  FINDINGS: NECK  No hypermetabolic lymph nodes in the neck. Diffuse increased uptake throughout both lobes of the thyroid gland identified.  CHEST  No hypermetabolic mediastinal or hilar nodes. No suspicious pulmonary  nodules on the CT scan.  ABDOMEN/PELVIS  No abnormal hypermetabolic activity within the liver, pancreas, adrenal glands, or spleen. Previous cholecystectomy. Surgical clips identified within the left periaortic region of the retroperitoneum. No hypermetabolic lymph nodes in the abdomen or pelvis.  SKELETON  No focal hypermetabolic activity to suggest skeletal metastasis.  IMPRESSION: 1. There is no evidence for residual or recurrent hypermetabolic tumor or adenopathy. 2. Diffusely increased radiotracer uptake throughout both lobes of the thyroid gland. Correlation with patient's TSH is recommended.   Electronically Signed   By: Kerby Moors M.D.   On: 04/17/2014 11:04  Dg Chest Portable 1 View  04/27/2014   CLINICAL DATA:  Shortness of breath last night, worse with exertion, COPD, atrial fibrillation  EXAM: PORTABLE CHEST - 1 VIEW  COMPARISON:  04/17/2014, 04/04/2014  FINDINGS: Stable mild hyperinflation. Borderline heart size without superimposed edema or CHF. No focal pneumonia, collapse or consolidation. No effusion or pneumothorax. Trachea midline.  IMPRESSION: Stable hyperinflation without superimposed acute process   Electronically Signed   By: Daryll Brod M.D.   On: 04/27/2014 13:01    Microbiology: No results found for this or any previous visit (from the past 240 hour(s)).   Labs: Basic Metabolic Panel:  Recent Labs Lab 04/27/14 1253 04/28/14 0652  NA 141 142  K 3.9 4.0  CL 110 113*  CO2 23 26  GLUCOSE 95 94  BUN 12 10  CREATININE 0.95 0.96  CALCIUM 10.0 8.8   Liver Function Tests:  Recent Labs Lab 04/28/14 0652  AST 16  ALT 12  ALKPHOS 46  BILITOT 0.8  PROT 5.7*  ALBUMIN 3.0*   No results for input(s): LIPASE, AMYLASE in the last 168 hours. No results for input(s): AMMONIA in the last 168 hours. CBC:  Recent Labs Lab 04/27/14 1253 04/28/14 0652  WBC 5.1 3.9*  NEUTROABS 2.6  --   HGB 12.6 11.5*  HCT 38.0 34.8*  MCV 98.2 99.4  PLT 161 123*   Cardiac  Enzymes:  Recent Labs Lab 04/27/14 1253 04/27/14 1759 04/27/14 2001 04/27/14 2300  TROPONINI <0.03 <0.03 <0.03 <0.03   BNP: BNP (last 3 results)  Recent Labs  04/04/14 1154 04/27/14 1253  BNP 229.4* 83.0    ProBNP (last 3 results) No results for input(s): PROBNP in the last 8760 hours.  CBG: No results for input(s): GLUCAP in the last 168 hours.     SignedRadene Gunning  [facility] 04/28/2014, 3:02 PM

## 2014-04-30 LAB — URINE CULTURE: Colony Count: >=100000

## 2014-05-05 DIAGNOSIS — I10 Essential (primary) hypertension: Secondary | ICD-10-CM | POA: Diagnosis not present

## 2014-05-05 DIAGNOSIS — R079 Chest pain, unspecified: Secondary | ICD-10-CM | POA: Diagnosis not present

## 2014-05-05 DIAGNOSIS — N390 Urinary tract infection, site not specified: Secondary | ICD-10-CM | POA: Diagnosis not present

## 2014-05-05 DIAGNOSIS — G7001 Myasthenia gravis with (acute) exacerbation: Secondary | ICD-10-CM | POA: Diagnosis not present

## 2014-05-05 DIAGNOSIS — N182 Chronic kidney disease, stage 2 (mild): Secondary | ICD-10-CM | POA: Diagnosis not present

## 2014-05-05 DIAGNOSIS — R0602 Shortness of breath: Secondary | ICD-10-CM | POA: Diagnosis not present

## 2014-05-08 ENCOUNTER — Other Ambulatory Visit (HOSPITAL_COMMUNITY): Payer: Self-pay | Admitting: Radiology

## 2014-05-08 DIAGNOSIS — R0602 Shortness of breath: Secondary | ICD-10-CM

## 2014-05-11 DIAGNOSIS — Z029 Encounter for administrative examinations, unspecified: Secondary | ICD-10-CM | POA: Diagnosis not present

## 2014-05-11 DIAGNOSIS — D696 Thrombocytopenia, unspecified: Secondary | ICD-10-CM | POA: Diagnosis not present

## 2014-05-11 DIAGNOSIS — R718 Other abnormality of red blood cells: Secondary | ICD-10-CM | POA: Diagnosis not present

## 2014-05-11 DIAGNOSIS — Z8349 Family history of other endocrine, nutritional and metabolic diseases: Secondary | ICD-10-CM | POA: Diagnosis not present

## 2014-05-11 DIAGNOSIS — C755 Malignant neoplasm of aortic body and other paraganglia: Secondary | ICD-10-CM | POA: Diagnosis not present

## 2014-05-12 ENCOUNTER — Encounter: Payer: Self-pay | Admitting: Cardiovascular Disease

## 2014-05-12 ENCOUNTER — Ambulatory Visit (INDEPENDENT_AMBULATORY_CARE_PROVIDER_SITE_OTHER): Payer: Medicare Other | Admitting: Cardiovascular Disease

## 2014-05-12 VITALS — BP 98/56 | HR 53 | Ht 64.0 in | Wt 192.0 lb

## 2014-05-12 DIAGNOSIS — I10 Essential (primary) hypertension: Secondary | ICD-10-CM | POA: Diagnosis not present

## 2014-05-12 DIAGNOSIS — R0602 Shortness of breath: Secondary | ICD-10-CM | POA: Diagnosis not present

## 2014-05-12 DIAGNOSIS — R079 Chest pain, unspecified: Secondary | ICD-10-CM

## 2014-05-12 DIAGNOSIS — E785 Hyperlipidemia, unspecified: Secondary | ICD-10-CM

## 2014-05-12 DIAGNOSIS — R001 Bradycardia, unspecified: Secondary | ICD-10-CM | POA: Diagnosis not present

## 2014-05-12 DIAGNOSIS — Z9289 Personal history of other medical treatment: Secondary | ICD-10-CM

## 2014-05-12 DIAGNOSIS — I493 Ventricular premature depolarization: Secondary | ICD-10-CM

## 2014-05-12 DIAGNOSIS — I480 Paroxysmal atrial fibrillation: Secondary | ICD-10-CM

## 2014-05-12 DIAGNOSIS — Z87898 Personal history of other specified conditions: Secondary | ICD-10-CM | POA: Diagnosis not present

## 2014-05-12 NOTE — Progress Notes (Signed)
Patient ID: [name], female   DOB: 1940-05-27, 74 y.o.   MRN: [id number]      SUBJECTIVE: The patient returns for follow-up of paroxysmal atrial fibrillation. She was recently hospitalized for shortness of breath. She ruled out for an acute coronary syndrome with normal troponins. She was bradycardic and propranolol was reduced from 40 mg twice daily to 20 mg twice daily.  She continues to complain of shortness of breath and is scheduled to undergo pulmonary function testing tomorrow. She has blood pressure fluctuations with systolic readings up to the 180 range but today's blood pressure is 98/56. She only takes 5 mg of lisinopril 1 her blood pressure is high. She remains bradycardic with a heart rate of 53 bpm. She denies chest pain and dizziness.  She says she has frequent PVCs, up to 5 in 30 seconds and requests Holter monitoring.  In summary, echocardiogram on 04/08/14 demonstrated normal left ventricular systolic function, EF 63-14%, moderate LVH with grade 1 diastolic dysfunction, and mild mitral and tricuspid regurgitation. She underwent nuclear stress testing which showed a very small apical anterolateral defect which was deemed secondary to either variable soft tissue attenuation versus a very small region of ischemia. There were no large ischemic zones and it was deemed a low risk stress test overall with normal left ventricle systolic function and regional wall motion.   Review of Systems: As per "subjective", otherwise negative.  Allergies  Allergen Reactions  . Avocado Diarrhea and Nausea And Vomiting  . Macrodantin [Nitrofurantoin Macrocrystal] Nausea And Vomiting  . Tetracyclines & Related Nausea And Vomiting  . Ampicillin Rash  . Biaxin [Clarithromycin] Rash  . Codeine Itching    Can not take Codeine unless in cough syrup    Current Outpatient Prescriptions  Medication Sig Dispense Refill  . ALPRAZolam (XANAX) 1 MG tablet Take 1 mg by mouth at bedtime as needed for  sleep. HS as needed and can be repeated x 1 if needed. insomnia    . amitriptyline (ELAVIL) 25 MG tablet Take 25 mg by mouth at bedtime.    Marland Kitchen apixaban (ELIQUIS) 5 MG TABS tablet Take 1 tablet (5 mg total) by mouth 2 (two) times daily. 60 tablet 11  . Calcium Carbonate-Vit D-Min (CALTRATE 600+D PLUS MINERALS PO) Take 1 tablet by mouth 2 (two) times daily.     . feeding supplement, ENSURE COMPLETE, (ENSURE COMPLETE) LIQD Take 237 mLs by mouth 2 (two) times daily between meals.    Marland Kitchen HYDROcodone-acetaminophen (NORCO) 10-325 MG per tablet Take 1 tablet by mouth every 6 (six) hours as needed.    . lansoprazole (PREVACID) 30 MG capsule TAKE ONE CAPSULE BY MOUTH TWICE A DAY 60 capsule 5  . levothyroxine (SYNTHROID, LEVOTHROID) 112 MCG tablet Take 112 mcg by mouth daily before breakfast.     . lidocaine (LIDODERM) 5 % Place 1-3 patches onto the skin daily as needed. Remove & Discard patch within 12 hours or as directed by MD for pain    . lisinopril (PRINIVIL,ZESTRIL) 5 MG tablet Take 1 tablet (5 mg total) by mouth daily. 30 tablet 0  . Magnesium 250 MG TABS Take 250 mg by mouth daily. Takes up to twice daily as needed for muscle cramps    . Multiple Vitamin (MULTIVITAMIN WITH MINERALS) TABS Take 1 tablet by mouth daily.    . NON FORMULARY Apply 1 application topically 2 (two) times daily. Nail clear from dermatologist    . Omega-3 Fatty Acids (FISH OIL) 1200 MG CAPS Take  1,200 mg by mouth daily.     . ondansetron (ZOFRAN) 4 MG tablet Take 4 mg by mouth daily as needed for nausea or vomiting.     . pravastatin (PRAVACHOL) 80 MG tablet Take 40 mg by mouth at bedtime.     . propranolol (INDERAL) 20 MG tablet Take 1 tablet (20 mg total) by mouth 2 (two) times daily. 60 tablet 0  . pyridostigmine (MESTINON) 60 MG tablet TAKE 0.5 TABLETS (30 MG TOTAL) BY MOUTH 3 (THREE) TIMES DAILY. 45 tablet 0  . SUMAtriptan (IMITREX) 50 MG tablet Take 50 mg by mouth every 2 (two) hours as needed. For migraines     No current  facility-administered medications for this visit.    Past Medical History  Diagnosis Date  . Essential hypertension   . Mixed hyperlipidemia   . Obesity   . GERD (gastroesophageal reflux disease)   . Hypothyroidism   . Migraines   . Osteoarthritis of knee   . IBS (irritable colon syndrome)   . Ocular myasthenia gravis   . Complication of anesthesia     Low blood pressure, heart rate, O2 sat  . PAF (paroxysmal atrial fibrillation)   . History of pneumonia   . Skin cancer   . Anemia   . Diverticulosis   . Chronic back pain   . Chronic nausea   . Paraganglioma     Resection 02/2012 (retroperitoneal)  . Depression with anxiety     Past Surgical History  Procedure Laterality Date  . Knee surgery    . Total knee arthroplasty Bilateral     2005 and 2011  . Abdominal hysterectomy    . Cholecystectomy    . Nasal sinus surgery      30 yrs ago  . Colonoscopy  08/24/2011    Procedure: COLONOSCOPY;  Surgeon: Rogene Houston, MD;  Location: [facility];  Service: Endoscopy;  Laterality: N/A;  1200  . Ct guided biopsy  01/16/12  . Givens capsule study  01/29/2012    Procedure: GIVENS CAPSULE STUDY;  Surgeon: Rogene Houston, MD;  Location: [facility];  Service: Endoscopy;  Laterality: N/A;  730  . Laparotomy  03/01/2012    Procedure: EXPLORATORY LAPAROTOMY;  Surgeon: Earnstine Regal, MD;  Location: [facility];  Service: General;  Laterality: N/A;  Exploratory Laparotomy ,Resection Retroperitoneal Mass  . Cardiac catheterization      History   Social History  . Marital Status: Divorced    Spouse Name: N/A  . Number of Children: N/A  . Years of Education: N/A   Occupational History  . Not on file.   Social History Main Topics  . Smoking status: Never Smoker   . Smokeless tobacco: Never Used  . Alcohol Use: No  . Drug Use: No  . Sexual Activity: Not on file   Other Topics Concern  . Not on file   Social History Narrative     Filed Vitals:   05/12/14 1459  BP:  98/56  Pulse: 53  Height: 5\' 4"  (1.626 m)  Weight: 192 lb (87.091 kg)  SpO2: 99%    PHYSICAL EXAM General: NAD HEENT: Normal. Neck: No JVD, no thyromegaly. Lungs: Clear to auscultation bilaterally with normal respiratory effort. CV: Nondisplaced PMI.  Regular rate and rhythm with brief pauses no more than 1.5 seconds, normal S1/S2, no S3/S4, no murmur. No pretibial or periankle edema. Abdomen: Soft, nontender, obese, no distention.  Neurologic: Alert and oriented x 3.  Psych: Somewhat flat  affect. Skin: Normal. Musculoskeletal: No gross deformities. Extremities: No clubbing or cyanosis.   ECG: Most recent ECG reviewed.      ASSESSMENT AND PLAN: 1. Chest pain: No recurrences. Low risk stress test. No further cardiac testing is indicated. Normal LV systolic function and regional wall motion. Episodes were likely correspondent with rapid atrial fibrillation. 2. Essential HTN: Low normal today but high at home on low dose propranolol. She has been using a manual cuff and lying down on her left side when taking it. I informed that this is not the correct way to take a BP. She will purchase an automated cuff and sit in a chair while recording it. 3. Hyperlipidemia: On pravastatin 40 mg. No changes. 4. Atrial fibrillation: Anticoagulated with Eliquis. Bradycardic and short of breath with current dose of propranolol, and now complaining of frequent PVC's. I will obtain a one week event monitor. I mentioned other strategies such as ablation and antiarrhythmic therapy. I will have her see Dr. Rayann Heman, one of our electrophysiologists, to be evaluated for additional options.  Dispo: f/u with me in 6 months. Evaluation by Dr. Rayann Heman in next one month.  Time spent: 40 minutes, of which greater than 50% was spent reviewing symptoms, relevant blood tests and studies, and discussing management plan with the patient.   Kate Sable, M.D., F.A.C.C.

## 2014-05-12 NOTE — Patient Instructions (Addendum)
Your physician wants you to follow-up in: 6 months with Dr Virgina Jock will receive a reminder letter in the mail two months in advance. If you don't receive a letter, please call our office to schedule the follow-up appointment.  Your physician recommends that you continue on your current medications as directed. Please refer to the Current Medication list given to you today.   Your physician has recommended that you wear an event monitor for 7 days. Event monitors are medical devices that record the heart's electrical activity. Doctors most often [place] these monitors to diagnose arrhythmias. Arrhythmias are problems with the speed or rhythm of the heartbeat. The monitor is a small, portable device. You can wear one while you do your normal daily activities. This is usually used to diagnose what is causing palpitations/syncope (passing out).   Please see Dr.Allred AFTER your event monitor   I have given you 5 weeks samples of Eliquis  Thank you for choosing [facility] !

## 2014-05-13 ENCOUNTER — Ambulatory Visit (HOSPITAL_COMMUNITY)
Admission: RE | Admit: 2014-05-13 | Discharge: 2014-05-13 | Disposition: A | Discharge status: Home / Self Care | Payer: Medicare Other | Source: Ambulatory Visit | Attending: Internal Medicine | Admitting: Internal Medicine

## 2014-05-13 DIAGNOSIS — R0602 Shortness of breath: Secondary | ICD-10-CM | POA: Insufficient documentation

## 2014-05-13 LAB — PULMONARY FUNCTION TEST
DL/VA % pred: 64 %
DL/VA: 3.08 ml/min/mmHg/L
DLCO COR % PRED: 69 %
DLCO UNC: 16.98 ml/min/mmHg
DLCO cor: 16.98 ml/min/mmHg
DLCO unc % pred: 69 %
FEF 25-75 POST: 1.09 L/s
FEF 25-75 Pre: 1.69 L/sec
FEF2575-%CHANGE-POST: -35 %
FEF2575-%PRED-PRE: 103 %
FEF2575-%Pred-Post: 66 %
FEV1-%Change-Post: -14 %
FEV1-%Pred-Post: 90 %
FEV1-%Pred-Pre: 105 %
FEV1-Post: 1.89 L
FEV1-Pre: 2.2 L
FEV1FVC-%Change-Post: -11 %
FEV1FVC-%Pred-Pre: 99 %
FEV6-%Change-Post: -4 %
FEV6-%Pred-Post: 105 %
FEV6-%Pred-Pre: 111 %
FEV6-POST: 2.81 L
FEV6-PRE: 2.95 L
FEV6FVC-%Change-Post: 0 %
FEV6FVC-%PRED-PRE: 105 %
FEV6FVC-%Pred-Post: 104 %
FVC-%Change-Post: -3 %
FVC-%PRED-PRE: 105 %
FVC-%Pred-Post: 102 %
FVC-POST: 2.85 L
FVC-Pre: 2.95 L
POST FEV6/FVC RATIO: 99 %
Post FEV1/FVC ratio: 66 %
Pre FEV1/FVC ratio: 75 %
Pre FEV6/FVC Ratio: 100 %
RV % pred: 63 %
RV: 1.46 L
TLC % pred: 84 %
TLC: 4.26 L

## 2014-05-13 MED ORDER — ALBUTEROL SULFATE (2.5 MG/3ML) 0.083% IN NEBU
2.5000 mg | INHALATION_SOLUTION | Freq: Once | RESPIRATORY_TRACT | Status: AC
  Administered 2014-05-13: 2.5 mg via RESPIRATORY_TRACT

## 2014-05-25 ENCOUNTER — Ambulatory Visit (INDEPENDENT_AMBULATORY_CARE_PROVIDER_SITE_OTHER): Payer: Medicare Other | Admitting: Internal Medicine

## 2014-05-25 ENCOUNTER — Other Ambulatory Visit: Payer: Self-pay

## 2014-05-25 ENCOUNTER — Encounter: Payer: Self-pay | Admitting: Internal Medicine

## 2014-05-25 VITALS — BP 120/70 | HR 56 | Ht 64.0 in | Wt 189.8 lb

## 2014-05-25 DIAGNOSIS — R001 Bradycardia, unspecified: Secondary | ICD-10-CM

## 2014-05-25 DIAGNOSIS — I119 Hypertensive heart disease without heart failure: Secondary | ICD-10-CM | POA: Diagnosis not present

## 2014-05-25 DIAGNOSIS — I480 Paroxysmal atrial fibrillation: Secondary | ICD-10-CM

## 2014-05-25 NOTE — Progress Notes (Signed)
Electrophysiology Office Note   Date:  05/25/2014   ID:  [name], DOB [birth year], MRN [id number]  PCP:  Delphina Cahill, MD  Cardiologist:  Dr [name] Primary Electrophysiologist: Thompson Grayer, MD    Chief Complaint  Patient presents with  . Appointment    PAF & PVCs     History of Present Illness: [name] is a 74 y.o. female who presents today for electrophysiology evaluation.   She presents for EP evaluation of afib.  She reports being in good health until 04/04/14 when she developed tachypalpitations.  She presented to [facility] at that time and was found on ekg to have afib with RVR.She spontaneously converted to sinus rhythm.  She had OP stress myoview that was low risk. Eliquis was initiated with a CHADS VASC 4 and she was continued on inderal (also for migraines).  On follow up office visit with Dr. [name] 04/16/2014, she was found to be bradycardic with resting HR of 44 bpm. Propanolol was decreased to 40 mg BID from TID. She had a follow up appointment with Dr. Laural Golden on 04/20/2014 with HR of 56 bpm at rest. Daughter called our office on 04/27/2014 due to the patients complaints of sudden shortness of breath and decreased O2 Sat of 88-89% and HR of 50 bpm. She was advised to go to ER by Wilber Oliphant, RN.   On arrival to ER, BP 113/93, HR 52 bpm. O2 Sat 99% on RA. Her workup was uneventful.  She was in sinus rhythm but had pvcs.  A holter monitor was placed and she was referred for EP consultation. She has had no symptoms of afib since her discharge.  She snores.  She has fatigue and does not feel well rested in the am.  Today, she denies symptoms of chest pain, shortness of breath, orthopnea, PND, lower extremity edema, claudication, dizziness, presyncope, syncope, bleeding, or neurologic sequela.  She has visual disturbance with myesthenia.  She has chronic muscle and back pain.The patient is tolerating medications without difficulties and is otherwise  without complaint today.    Past Medical History  Diagnosis Date  . Essential hypertension   . Mixed hyperlipidemia   . Obesity   . GERD (gastroesophageal reflux disease)   . Hypothyroidism   . Migraines   . Osteoarthritis of knee   . IBS (irritable colon syndrome)   . Ocular myasthenia gravis   . Complication of anesthesia     Low blood pressure, heart rate, O2 sat  . PAF (paroxysmal atrial fibrillation)     chads2vasc score is at least 4  . History of pneumonia   . Skin cancer   . Anemia   . Diverticulosis   . Chronic back pain   . Chronic nausea   . Paraganglioma     Resection 02/2012 (retroperitoneal)  . Depression with anxiety   . LVH (left ventricular hypertrophy)    Past Surgical History  Procedure Laterality Date  . Knee surgery    . Total knee arthroplasty Bilateral     2005 and 2011  . Abdominal hysterectomy    . Cholecystectomy    . Nasal sinus surgery      30 yrs ago  . Colonoscopy  08/24/2011    Procedure: COLONOSCOPY;  Surgeon: Rogene Houston, MD;  Location: [facility];  Service: Endoscopy;  Laterality: N/A;  1200  . Ct guided biopsy  01/16/12  . Givens capsule study  01/29/2012    Procedure: GIVENS CAPSULE STUDY;  Surgeon: Rogene Houston, MD;  Location: [facility];  Service: Endoscopy;  Laterality: N/A;  730  . Laparotomy  03/01/2012    Procedure: EXPLORATORY LAPAROTOMY;  Surgeon: Earnstine Regal, MD;  Location: [facility] ORS;  Service: General;  Laterality: N/A;  Exploratory Laparotomy ,Resection Retroperitoneal Mass  . Cardiac catheterization       Current Outpatient Prescriptions  Medication Sig Dispense Refill  . ALPRAZolam (XANAX) 1 MG tablet Take 1 mg by mouth at bedtime as needed for sleep. HS as needed and can be repeated x 1 if needed. insomnia    . amitriptyline (ELAVIL) 25 MG tablet Take 25 mg by mouth at bedtime.    Marland Kitchen apixaban (ELIQUIS) 5 MG TABS tablet Take 1 tablet (5 mg total) by mouth 2 (two) times daily. 60 tablet 11  . Calcium  Carbonate-Vit D-Min (CALTRATE 600+D PLUS MINERALS PO) Take 1 tablet by mouth 2 (two) times daily.     . diclofenac (VOLTAREN) 75 MG EC tablet Take 75 mg by mouth daily.    Marland Kitchen HYDROcodone-acetaminophen (NORCO) 10-325 MG per tablet Take 1 tablet by mouth every 6 (six) hours as needed (pain).     Marland Kitchen lansoprazole (PREVACID) 30 MG capsule TAKE ONE CAPSULE BY MOUTH TWICE A DAY 60 capsule 5  . levothyroxine (SYNTHROID, LEVOTHROID) 112 MCG tablet Take 112 mcg by mouth daily before breakfast.     . lidocaine (LIDODERM) 5 % Place 1-3 patches onto the skin daily as needed. Remove & Discard patch within 12 hours or as directed by MD for pain    . lisinopril (PRINIVIL,ZESTRIL) 5 MG tablet Take 2.5 mg by mouth daily.    . Magnesium 250 MG TABS Take 250 mg by mouth daily. Takes up to twice daily as needed for muscle cramps    . Multiple Vitamin (MULTIVITAMIN WITH MINERALS) TABS Take 1 tablet by mouth daily.    . NON FORMULARY Apply 1 application topically 2 (two) times daily. Nail clear from dermatologist    . Omega-3 Fatty Acids (FISH OIL) 1200 MG CAPS Take 1,200 mg by mouth daily.     . ondansetron (ZOFRAN) 4 MG tablet Take 4 mg by mouth daily as needed for nausea or vomiting.     . pravastatin (PRAVACHOL) 80 MG tablet Take 40 mg by mouth at bedtime.     . propranolol (INDERAL) 40 MG tablet Take 20 mg by mouth 2 (two) times daily.    Marland Kitchen pyridostigmine (MESTINON) 60 MG tablet Take 60 mg by mouth in the AM, take 30 mg by mouth midday and take 30 mg by mouth at bedtime    . SUMAtriptan (IMITREX) 50 MG tablet Take 50 mg by mouth every 2 (two) hours as needed. For migraines    . methocarbamol (ROBAXIN) 500 MG tablet Take 500 mg by mouth daily as needed. Muscle spasms     No current facility-administered medications for this visit.    Allergies:   Tylox; Avocado; Macrodantin; Other; Tetracyclines & related; Ampicillin; Biaxin; and Codeine   Social History:  The patient  reports that she has never smoked. She has  never used smokeless tobacco. She reports that she does not drink alcohol or use illicit drugs.   Family History:  The patient's  family history includes Inflammatory bowel disease in her maternal grandmother.    ROS:  Please see the history of present illness.   All other systems are reviewed and negative.    PHYSICAL EXAM: VS:  BP 120/70 mmHg  Pulse 56  Ht 5\' 4"  (1.626 m)  Wt 189 lb 12.8 oz (86.093 kg)  BMI 32.56 kg/m2 , BMI Body mass index is 32.56 kg/(m^2). GEN: overweight, in no acute distress HEENT: normal Neck: no JVD, carotid bruits, or masses Cardiac: RRR; no murmurs, rubs, or gallops,no edema  Respiratory:  clear to auscultation bilaterally, normal work of breathing GI: soft, nontender, nondistended, + BS MS: no deformity or atrophy Skin: warm and dry  Neuro:  Strength and sensation are intact Psych: euthymic mood, full affect  EKG:  EKG is ordered today. The ekg ordered today shows sinus rhythm 56 bpm, PR 142, QRS 98, Qtc 387, LAHB, otherwise normal ekg   Recent Labs: 04/05/2014: Magnesium 1.8 04/27/2014: B Natriuretic Peptide 83.0 04/28/2014: ALT 12; BUN 10; Creatinine 0.96; Hemoglobin 11.5*; Platelets 123*; Potassium 4.0; Sodium 142; TSH 0.090*    Lipid Panel  No results found for: CHOL, TRIG, HDL, CHOLHDL, VLDL, LDLCALC, LDLDIRECT   Wt Readings from Last 3 Encounters:  05/25/14 189 lb 12.8 oz (86.093 kg)  05/12/14 192 lb (87.091 kg)  04/27/14 190 lb (86.183 kg)      Other studies Reviewed: Additional studies/ records that were reviewed today include: Hospital records, outpatient ekgs, echo, and Dr Court Joy notes   Event monitor is reviewed and reveals sinus rhythm with rare PVCs (29 pages are reviewed in detail today)  ASSESSMENT AND PLAN:  1.  Paroxysmal atrial fibrillation She is aware of only 1 episode of afib (04/04/14).  She has frequent PVCs but does not feel overly bothered by these.  She is appropriately anticoagulated for chads2vasc score of  4.  At this time, I think that we should continue her current rate control strategy.  Should her afib progress, I think that our options would be multaq, sotalol, or tikosyn (in that order).  Given that she has had only a single episode of afib thus far, I think that we should defer AAD therapy.  2. PVCs Occasional pvcs, continue current therapy as above  3. Hypertensive cardiovascular disease lVH by echo BP is better controlled at his time  4. Sinus bradycardia Clinically stable at this time Presently, she does not have an indication for pacing    Current medicines are reviewed at length with the patient today.   The patient does not have concerns regarding her medicines.  The following changes were made today:  none  Follow-up with Dr [name] as scheduled I will see as needed going forward   Signed, Thompson Grayer, MD  05/25/2014 2:46 PM     [facility] [address] [place] [address] [phone] (office) [phone] (fax)

## 2014-05-25 NOTE — Patient Instructions (Signed)
Your physician recommends that you schedule a follow-up appointment as needed  Talk to Dr Nevada Crane about sleep study

## 2014-05-29 ENCOUNTER — Telehealth: Payer: Self-pay | Admitting: *Deleted

## 2014-05-29 NOTE — Telephone Encounter (Signed)
EOS report received and placed in Dr. [name] folder for review.

## 2014-06-02 DIAGNOSIS — R000 Tachycardia, unspecified: Secondary | ICD-10-CM | POA: Diagnosis not present

## 2014-06-02 DIAGNOSIS — I10 Essential (primary) hypertension: Secondary | ICD-10-CM | POA: Diagnosis not present

## 2014-06-02 DIAGNOSIS — N182 Chronic kidney disease, stage 2 (mild): Secondary | ICD-10-CM | POA: Diagnosis not present

## 2014-06-02 DIAGNOSIS — E782 Mixed hyperlipidemia: Secondary | ICD-10-CM | POA: Diagnosis not present

## 2014-06-15 ENCOUNTER — Ambulatory Visit (INDEPENDENT_AMBULATORY_CARE_PROVIDER_SITE_OTHER): Payer: Medicare Other | Admitting: Internal Medicine

## 2014-06-16 ENCOUNTER — Other Ambulatory Visit: Payer: Self-pay

## 2014-06-16 DIAGNOSIS — I493 Ventricular premature depolarization: Secondary | ICD-10-CM

## 2014-06-16 DIAGNOSIS — I480 Paroxysmal atrial fibrillation: Secondary | ICD-10-CM

## 2014-06-22 ENCOUNTER — Other Ambulatory Visit (HOSPITAL_COMMUNITY): Payer: Self-pay | Admitting: Radiology

## 2014-06-22 DIAGNOSIS — G4700 Insomnia, unspecified: Secondary | ICD-10-CM

## 2014-06-30 DIAGNOSIS — I10 Essential (primary) hypertension: Secondary | ICD-10-CM | POA: Diagnosis not present

## 2014-06-30 DIAGNOSIS — E039 Hypothyroidism, unspecified: Secondary | ICD-10-CM | POA: Diagnosis not present

## 2014-06-30 DIAGNOSIS — E782 Mixed hyperlipidemia: Secondary | ICD-10-CM | POA: Diagnosis not present

## 2014-07-02 DIAGNOSIS — E782 Mixed hyperlipidemia: Secondary | ICD-10-CM | POA: Diagnosis not present

## 2014-07-02 DIAGNOSIS — I480 Paroxysmal atrial fibrillation: Secondary | ICD-10-CM | POA: Diagnosis not present

## 2014-07-02 DIAGNOSIS — G7000 Myasthenia gravis without (acute) exacerbation: Secondary | ICD-10-CM | POA: Diagnosis not present

## 2014-07-02 DIAGNOSIS — M25511 Pain in right shoulder: Secondary | ICD-10-CM | POA: Diagnosis not present

## 2014-07-12 ENCOUNTER — Ambulatory Visit: Payer: Medicare Other | Attending: Internal Medicine | Admitting: Sleep Medicine

## 2014-07-12 DIAGNOSIS — G4700 Insomnia, unspecified: Secondary | ICD-10-CM | POA: Diagnosis not present

## 2014-07-12 DIAGNOSIS — G4730 Sleep apnea, unspecified: Secondary | ICD-10-CM | POA: Diagnosis not present

## 2014-07-17 NOTE — Sleep Study (Signed)
[place] A. Merlene Laughter, MD     [email or web address]        NOCTURNAL POLYSOMNOGRAM    LOCATION: SLEEP LAB FACILITY: [facility]   PHYSICIAN: Kingst[place] Deruiter A. Merlene Laughter, M.D.   DATE OF STUDY: 07/12/2014   REFERRING PHYSICIAN: Wende Neighbors.   INDICATIONS: The patient is 74 year old who presents with snoring, fatigue and difficulty sleeping.  MEDICATIONS:  Prior to Admission medications   Medication Sig Start Date End Date Taking? Authorizing Provider  ALPRAZolam Duanne Moron) 1 MG tablet Take 1 mg by mouth at bedtime as needed for sleep. HS as needed and can be repeated x 1 if needed. insomnia    Historical Provider, MD  amitriptyline (ELAVIL) 25 MG tablet Take 25 mg by mouth at bedtime.    Historical Provider, MD  apixaban (ELIQUIS) 5 MG TABS tablet Take 1 tablet (5 mg total) by mouth 2 (two) times daily. 04/05/14   Liliane Shi, PA-C  Calcium Carbonate-Vit D-Min (CALTRATE 600+D PLUS MINERALS PO) Take 1 tablet by mouth 2 (two) times daily.     Historical Provider, MD  diclofenac (VOLTAREN) 75 MG EC tablet Take 75 mg by mouth daily.    Historical Provider, MD  HYDROcodone-acetaminophen (NORCO) 10-325 MG per tablet Take 1 tablet by mouth every 6 (six) hours as needed (pain).     Historical Provider, MD  lansoprazole (PREVACID) 30 MG capsule TAKE ONE CAPSULE BY MOUTH TWICE A DAY 01/12/14   Butch Penny, NP  levothyroxine (SYNTHROID, LEVOTHROID) 112 MCG tablet Take 112 mcg by mouth daily before breakfast.     Historical Provider, MD  lidocaine (LIDODERM) 5 % Place 1-3 patches onto the skin daily as needed. Remove & Discard patch within 12 hours or as directed by MD for pain    Historical Provider, MD  lisinopril (PRINIVIL,ZESTRIL) 5 MG tablet Take 2.5 mg by mouth daily.    Historical Provider, MD  Magnesium 250 MG TABS Take 250 mg by mouth daily. Takes up to twice daily as needed for muscle cramps    Historical Provider, MD  methocarbamol (ROBAXIN) 500 MG tablet Take 500 mg by mouth daily  as needed. Muscle spasms    Historical Provider, MD  Multiple Vitamin (MULTIVITAMIN WITH MINERALS) TABS Take 1 tablet by mouth daily.    Historical Provider, MD  NON FORMULARY Apply 1 application topically 2 (two) times daily. Nail clear from dermatologist    Historical Provider, MD  Omega-3 Fatty Acids (FISH OIL) 1200 MG CAPS Take 1,200 mg by mouth daily.     Historical Provider, MD  ondansetron (ZOFRAN) 4 MG tablet Take 4 mg by mouth daily as needed for nausea or vomiting.  03/28/14   Historical Provider, MD  pravastatin (PRAVACHOL) 80 MG tablet Take 40 mg by mouth at bedtime.     Historical Provider, MD  propranolol (INDERAL) 40 MG tablet Take 20 mg by mouth 2 (two) times daily.    Historical Provider, MD  pyridostigmine (MESTINON) 60 MG tablet Take 60 mg by mouth in the AM, take 30 mg by mouth midday and take 30 mg by mouth at bedtime    Historical Provider, MD  SUMAtriptan (IMITREX) 50 MG tablet Take 50 mg by mouth every 2 (two) hours as needed. For migraines    Historical Provider, MD      EPWORTH SLEEPINESS SCALE: 3.   BMI: 32.   ARCHITECTURAL SUMMARY: Total recording time was 425 minutes. Sleep efficiency 85 %. Sleep latency 11 minutes. REM latency 322 minutes. Stage NI  11 %, N2 83 % and N3 0 % and REM sleep 6 %.    RESPIRATORY DATA:  Baseline oxygen saturation is 95 %. The lowest saturation is 79 %. The diagnostic AHI is 5. The RDI is 6. The REM AHI is 40.  LIMB MOVEMENT SUMMARY: PLM index 0.   ELECTROCARDIOGRAM SUMMARY: Average heart rate is 50 with no significant dysrhythmias observed.   IMPRESSION:  1. Mild mostly REM related obstructive sleep apnea syndrome not requiring positive pressure treatment.  Thanks for this referral.  Labrittany Wechter A. Merlene Laughter, M.D. Diplomat, Tax adviser of Sleep Medicine.

## 2014-07-30 DIAGNOSIS — N182 Chronic kidney disease, stage 2 (mild): Secondary | ICD-10-CM | POA: Diagnosis not present

## 2014-07-30 DIAGNOSIS — R000 Tachycardia, unspecified: Secondary | ICD-10-CM | POA: Diagnosis not present

## 2014-07-30 DIAGNOSIS — E782 Mixed hyperlipidemia: Secondary | ICD-10-CM | POA: Diagnosis not present

## 2014-07-30 DIAGNOSIS — E039 Hypothyroidism, unspecified: Secondary | ICD-10-CM | POA: Diagnosis not present

## 2014-07-30 DIAGNOSIS — I10 Essential (primary) hypertension: Secondary | ICD-10-CM | POA: Diagnosis not present

## 2014-08-03 ENCOUNTER — Ambulatory Visit (INDEPENDENT_AMBULATORY_CARE_PROVIDER_SITE_OTHER): Payer: Medicare Other | Admitting: Internal Medicine

## 2014-08-03 DIAGNOSIS — G7000 Myasthenia gravis without (acute) exacerbation: Secondary | ICD-10-CM | POA: Diagnosis not present

## 2014-08-04 DIAGNOSIS — N3020 Other chronic cystitis without hematuria: Secondary | ICD-10-CM | POA: Diagnosis not present

## 2014-08-04 DIAGNOSIS — R8290 Unspecified abnormal findings in urine: Secondary | ICD-10-CM | POA: Diagnosis not present

## 2014-08-04 DIAGNOSIS — R312 Other microscopic hematuria: Secondary | ICD-10-CM | POA: Diagnosis not present

## 2014-08-04 DIAGNOSIS — N905 Atrophy of vulva: Secondary | ICD-10-CM | POA: Diagnosis not present

## 2014-08-04 DIAGNOSIS — N907 Vulvar cyst: Secondary | ICD-10-CM | POA: Diagnosis not present

## 2014-08-10 DIAGNOSIS — R319 Hematuria, unspecified: Secondary | ICD-10-CM | POA: Diagnosis not present

## 2014-08-18 ENCOUNTER — Ambulatory Visit (INDEPENDENT_AMBULATORY_CARE_PROVIDER_SITE_OTHER): Payer: Medicare Other | Admitting: Internal Medicine

## 2014-08-18 ENCOUNTER — Encounter (INDEPENDENT_AMBULATORY_CARE_PROVIDER_SITE_OTHER): Payer: Self-pay | Admitting: Internal Medicine

## 2014-08-18 VITALS — BP 108/68 | HR 56 | Temp 97.9°F | Resp 18 | Ht 64.0 in | Wt 193.2 lb

## 2014-08-18 DIAGNOSIS — K219 Gastro-esophageal reflux disease without esophagitis: Secondary | ICD-10-CM | POA: Diagnosis not present

## 2014-08-18 DIAGNOSIS — K589 Irritable bowel syndrome without diarrhea: Secondary | ICD-10-CM | POA: Diagnosis not present

## 2014-08-18 NOTE — Patient Instructions (Signed)
Can take diclofenac on as-needed basis. Notify if he have rectal bleeding or melena.

## 2014-08-18 NOTE — Progress Notes (Signed)
Presenting complaint;  Follow-up for IBS and GERD.  Subjective:  Patient is 74 year old Caucasian female was here for scheduled visit. She was last seen in [place] 2016. She states she is doing well from GI standpoint. She says heartburn is well controlled with lansoprazole twice daily. When she dropped the dose to once a day. This year she developed chest pain and therefore went back on twice a day schedule. She denies dysphagia nausea vomiting or melena. She also denies abdominal pain rectal bleeding or urgency. She is not having diarrhea anymore. She is not prone to constipation which she's able to controlled by dietary measures. For her arthritis and back pain she was given prescription for diclofenac but she has not used this medication yet. She is worried about potential GI complications. She is doing pool therapy at least 3 times a week and has helped with her arthritis and back pain. Her appetite is fair. Her weight is up by 4 pounds in the last 4 months.   Current Medications: Outpatient Encounter Prescriptions as of 08/18/2014  Medication Sig  . ALPRAZolam (XANAX) 1 MG tablet Take 1 mg by mouth at bedtime as needed for sleep. HS as needed and can be repeated x 1 if needed. insomnia  . amitriptyline (ELAVIL) 25 MG tablet Take 25 mg by mouth at bedtime.  Marland Kitchen apixaban (ELIQUIS) 5 MG TABS tablet Take 1 tablet (5 mg total) by mouth 2 (two) times daily.  . Calcium Carbonate-Vit D-Min (CALTRATE 600+D PLUS MINERALS PO) Take 1 tablet by mouth 2 (two) times daily.   Marland Kitchen HYDROcodone-acetaminophen (NORCO) 10-325 MG per tablet Take 1 tablet by mouth every 6 (six) hours as needed (pain).   . hydrOXYzine (VISTARIL) 25 MG capsule Take 25 mg by mouth at bedtime. For bladder  . lansoprazole (PREVACID) 30 MG capsule TAKE ONE CAPSULE BY MOUTH TWICE A DAY  . levothyroxine (SYNTHROID, LEVOTHROID) 112 MCG tablet Take 100 mcg by mouth daily before breakfast.   . lidocaine (LIDODERM) 5 % Place 1-3 patches onto the  skin daily as needed. Remove & Discard patch within 12 hours or as directed by MD for pain  . lisinopril (PRINIVIL,ZESTRIL) 5 MG tablet Take 5 mg by mouth daily.   . Magnesium 250 MG TABS Take 250 mg by mouth daily. Takes up to twice daily as needed for muscle cramps  . methocarbamol (ROBAXIN) 500 MG tablet Take 500 mg by mouth daily as needed. Muscle spasms  . Multiple Vitamin (MULTIVITAMIN WITH MINERALS) TABS Take 1 tablet by mouth daily.  . NON FORMULARY Apply 1 application topically 2 (two) times daily. Nail clear from dermatologist  . Omega-3 Fatty Acids (FISH OIL) 1200 MG CAPS Take 1,200 mg by mouth daily.   . ondansetron (ZOFRAN) 4 MG tablet Take 4 mg by mouth daily as needed for nausea or vomiting.   . pravastatin (PRAVACHOL) 80 MG tablet Take 40 mg by mouth at bedtime.   . propranolol (INDERAL) 40 MG tablet Take 20 mg by mouth 2 (two) times daily.  Marland Kitchen pyridostigmine (MESTINON) 60 MG tablet Take 30 mg by mouth 3 (three) times daily. Take 60 mg by mouth in the AM, take 30 mg by mouth midday and take 30 mg by mouth at bedtime  . SUMAtriptan (IMITREX) 50 MG tablet Take 50 mg by mouth every 2 (two) hours as needed. For migraines  . diclofenac (VOLTAREN) 75 MG EC tablet Take 75 mg by mouth daily.   No facility-administered encounter medications on file as of 08/18/2014.  Objective: Blood pressure 108/68, pulse 56, temperature 97.9 F (36.6 C), temperature source Oral, resp. rate 18, height 5\' 4"  (1.626 m), weight 193 lb 3.2 oz (87.635 kg). Patient is alert and in no acute distress. Conjunctiva is pink. Sclera is nonicteric Oropharyngeal mucosa is normal. No neck masses or thyromegaly noted. Cardiac exam with regular rhythm normal S1 and S2. No murmur or gallop noted. Lungs are clear to auscultation. Abdomen is symmetrical and soft. Sigmoid colon is palpable but not tender. No organomegaly or masses.  No LE edema or clubbing noted.  Labs/studies Results:  PET results from January  2016 study noted.  Assessment:  #1. Irritable bowel syndrome. She used to have diarrhea with urgency and pain but now she is prone to be constipated. She is not even requiring dicyclomine. Amitriptyline may be helping. #2. GERD. Patient is requiring double dose PPI for symptom control #3. NSAID use. She is on low-dose aspirin and apixaban. Therefore taking NSAID on daily basis to decrease the risk of GI bleed. However when necessary use would pose less risk. She is somewhat protected because she is on PPI therapy. #4. 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   Plan:  Patient instructed to use diclofenac on an as-needed basis. Patient advised to watch for melena and rectal bleeding. Continue lansoprazole at 30 mg by mouth twice a day schedule. Office visit in one year.

## 2014-09-14 ENCOUNTER — Other Ambulatory Visit: Payer: Self-pay

## 2014-09-28 ENCOUNTER — Other Ambulatory Visit (HOSPITAL_COMMUNITY): Payer: Self-pay | Admitting: Internal Medicine

## 2014-09-28 DIAGNOSIS — Z1231 Encounter for screening mammogram for malignant neoplasm of breast: Secondary | ICD-10-CM

## 2014-09-29 ENCOUNTER — Other Ambulatory Visit (INDEPENDENT_AMBULATORY_CARE_PROVIDER_SITE_OTHER): Payer: Self-pay | Admitting: Internal Medicine

## 2014-10-14 ENCOUNTER — Ambulatory Visit (HOSPITAL_COMMUNITY)
Admission: RE | Admit: 2014-10-14 | Discharge: 2014-10-14 | Disposition: A | Discharge status: Home / Self Care | Payer: Medicare Other | Source: Ambulatory Visit | Attending: Internal Medicine | Admitting: Internal Medicine

## 2014-10-14 DIAGNOSIS — Z1231 Encounter for screening mammogram for malignant neoplasm of breast: Secondary | ICD-10-CM | POA: Diagnosis not present

## 2014-11-03 DIAGNOSIS — B351 Tinea unguium: Secondary | ICD-10-CM | POA: Diagnosis not present

## 2014-11-03 DIAGNOSIS — Z85828 Personal history of other malignant neoplasm of skin: Secondary | ICD-10-CM | POA: Diagnosis not present

## 2014-11-03 DIAGNOSIS — L570 Actinic keratosis: Secondary | ICD-10-CM | POA: Diagnosis not present

## 2014-11-11 DIAGNOSIS — E782 Mixed hyperlipidemia: Secondary | ICD-10-CM | POA: Diagnosis not present

## 2014-11-11 DIAGNOSIS — I10 Essential (primary) hypertension: Secondary | ICD-10-CM | POA: Diagnosis not present

## 2014-11-18 ENCOUNTER — Ambulatory Visit: Payer: Medicare Other | Admitting: Cardiovascular Disease

## 2014-11-18 DIAGNOSIS — Z23 Encounter for immunization: Secondary | ICD-10-CM | POA: Diagnosis not present

## 2014-11-18 DIAGNOSIS — G894 Chronic pain syndrome: Secondary | ICD-10-CM | POA: Diagnosis not present

## 2014-11-18 DIAGNOSIS — N182 Chronic kidney disease, stage 2 (mild): Secondary | ICD-10-CM | POA: Diagnosis not present

## 2014-11-18 DIAGNOSIS — I10 Essential (primary) hypertension: Secondary | ICD-10-CM | POA: Diagnosis not present

## 2014-11-18 DIAGNOSIS — E782 Mixed hyperlipidemia: Secondary | ICD-10-CM | POA: Diagnosis not present

## 2014-12-18 ENCOUNTER — Ambulatory Visit (INDEPENDENT_AMBULATORY_CARE_PROVIDER_SITE_OTHER): Payer: Medicare Other | Admitting: Cardiovascular Disease

## 2014-12-18 ENCOUNTER — Encounter: Payer: Self-pay | Admitting: Cardiovascular Disease

## 2014-12-18 VITALS — BP 116/78 | HR 50 | Ht 64.0 in | Wt 193.0 lb

## 2014-12-18 DIAGNOSIS — I119 Hypertensive heart disease without heart failure: Secondary | ICD-10-CM

## 2014-12-18 DIAGNOSIS — R001 Bradycardia, unspecified: Secondary | ICD-10-CM

## 2014-12-18 DIAGNOSIS — I480 Paroxysmal atrial fibrillation: Secondary | ICD-10-CM

## 2014-12-18 DIAGNOSIS — E785 Hyperlipidemia, unspecified: Secondary | ICD-10-CM

## 2014-12-18 DIAGNOSIS — I493 Ventricular premature depolarization: Secondary | ICD-10-CM | POA: Diagnosis not present

## 2014-12-18 DIAGNOSIS — I10 Essential (primary) hypertension: Secondary | ICD-10-CM

## 2014-12-18 NOTE — Patient Instructions (Signed)
Your physician wants you to follow-up in: 1 year with Dr Koneswaran You will receive a reminder letter in the mail two months in advance. If you don't receive a letter, please call our office to schedule the follow-up appointment.    Your physician recommends that you continue on your current medications as directed. Please refer to the Current Medication list given to you today.     Thank you for choosing [facility] !        

## 2014-12-18 NOTE — Progress Notes (Signed)
Patient ID: [name], female   DOB: December 18, 1940, 74 y.o.   MRN: [id number]      SUBJECTIVE: The patient returns for follow-up of paroxysmal atrial fibrillation.  Echocardiogram on 04/08/14 demonstrated normal left ventricular systolic function, EF 78-29%, moderate LVH with grade 1 diastolic dysfunction, and mild mitral and tricuspid regurgitation. She underwent nuclear stress testing which showed a very small apical anterolateral defect which was deemed secondary to either variable soft tissue attenuation versus a very small region of ischemia. There were no large ischemic zones and it was deemed a low risk stress test overall with normal left ventricle systolic function and regional wall motion.  She was evaluated by EP (Dr. Rayann Heman) last March who did not recommend antiarrhythmic drug therapy given her isolated instance of atrial fibrillation.  She is doing well and denies chest pain, shortness of breath, palpitations, and leg swelling. She exercises regularly by swimming in the pool at the [facility] and also exercises on her recumbent bicycle at home and does yoga and Pilates.   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  Review of Systems: As per "subjective", otherwise negative.  Allergies  Allergen Reactions  . Tylox [Oxycodone-Acetaminophen]     Severe nausea  . Avocado Diarrhea and Nausea And Vomiting  . Macrodantin [Nitrofurantoin Macrocrystal] Nausea And Vomiting  . Other Rash  . Tetracyclines & Related Nausea And Vomiting  . Ampicillin Rash  . Biaxin [Clarithromycin] Rash  . Codeine Itching    Can not take Codeine unless in cough syrup    Current Outpatient Prescriptions  Medication Sig Dispense Refill  . ALPRAZolam (XANAX) 1 MG tablet Take 1 mg by mouth at bedtime as needed for sleep. HS as needed and can be repeated x 1 if needed. insomnia    . amitriptyline (ELAVIL) 25 MG tablet Take 25 mg by mouth at bedtime.    Marland Kitchen apixaban  (ELIQUIS) 5 MG TABS tablet Take 1 tablet (5 mg total) by mouth 2 (two) times daily. 60 tablet 11  . aspirin EC 81 MG tablet Take 81 mg by mouth daily.    . Calcium Carbonate-Vit D-Min (CALTRATE 600+D PLUS MINERALS PO) Take 1 tablet by mouth 2 (two) times daily.     . Coenzyme Q10 (CO Q 10) 10 MG CAPS Take 10 mg by mouth daily.    Marland Kitchen docusate sodium (COLACE) 100 MG capsule Take 100 mg by mouth 2 (two) times daily.    . Glucosamine 500 MG CAPS Take 1,000 mg by mouth daily.    Marland Kitchen HYDROcodone-acetaminophen (NORCO) 10-325 MG per tablet Take 1 tablet by mouth every 6 (six) hours as needed (pain).     . Iron-Vit C-Vit B12-Folic Acid (IRON 562 PLUS PO) Take 65 mg by mouth.    . lansoprazole (PREVACID) 30 MG capsule TAKE (1) CAPSULE BY MOUTH TWICE DAILY. 60 capsule 6  . levothyroxine (SYNTHROID, LEVOTHROID) 112 MCG tablet Take 100 mcg by mouth daily before breakfast.     . lidocaine (LIDODERM) 5 % Place 1-3 patches onto the skin daily as needed. Remove & Discard patch within 12 hours or as directed by MD for pain    . lisinopril (PRINIVIL,ZESTRIL) 5 MG tablet Take 5 mg by mouth daily.     . Magnesium 250 MG TABS Take 250 mg by mouth daily. Takes up to twice daily as needed for muscle cramps    . methocarbamol (ROBAXIN) 500 MG tablet Take 500 mg  by mouth daily as needed. Muscle spasms    . Multiple Vitamin (MULTIVITAMIN WITH MINERALS) TABS Take 1 tablet by mouth daily.    . NON FORMULARY Apply 1 application topically 2 (two) times daily. Nail clear from dermatologist    . Omega-3 Fatty Acids (FISH OIL) 1200 MG CAPS Take 1,200 mg by mouth daily.     . ondansetron (ZOFRAN) 4 MG tablet Take 4 mg by mouth daily as needed for nausea or vomiting.     . pravastatin (PRAVACHOL) 80 MG tablet Take 80 mg by mouth daily.    . Probiotic Product (ALIGN PO) Take by mouth daily.    . propranolol (INDERAL) 40 MG tablet Take 20 mg by mouth 2 (two) times daily.    Marland Kitchen pyridostigmine (MESTINON) 60 MG tablet Take 30 mg by mouth 3  (three) times daily. Take 60 mg by mouth in the AM, take 30 mg by mouth midday and take 30 mg by mouth at bedtime    . SUMAtriptan (IMITREX) 50 MG tablet Take 50 mg by mouth every 2 (two) hours as needed. For migraines     No current facility-administered medications for this visit.    Past Medical History  Diagnosis Date  . Essential hypertension   . Mixed hyperlipidemia   . Obesity   . GERD (gastroesophageal reflux disease)   . Hypothyroidism   . Migraines   . Osteoarthritis of knee   . IBS (irritable colon syndrome)   . Ocular myasthenia gravis   . Complication of anesthesia     Low blood pressure, heart rate, O2 sat  . PAF (paroxysmal atrial fibrillation)     chads2vasc score is at least 4  . History of pneumonia   . Skin cancer   . Anemia   . Diverticulosis   . Chronic back pain   . Chronic nausea   . Paraganglioma     Resection 02/2012 (retroperitoneal)  . Depression with anxiety   . LVH (left ventricular hypertrophy)     Past Surgical History  Procedure Laterality Date  . Knee surgery    . Total knee arthroplasty Bilateral     2005 and 2011  . Abdominal hysterectomy    . Cholecystectomy    . Nasal sinus surgery      30 yrs ago  . Colonoscopy  08/24/2011    Procedure: COLONOSCOPY;  Surgeon: Rogene Houston, MD;  Location: [facility];  Service: Endoscopy;  Laterality: N/A;  1200  . Ct guided biopsy  01/16/12  . Givens capsule study  01/29/2012    Procedure: GIVENS CAPSULE STUDY;  Surgeon: Rogene Houston, MD;  Location: [facility];  Service: Endoscopy;  Laterality: N/A;  730  . Laparotomy  03/01/2012    Procedure: EXPLORATORY LAPAROTOMY;  Surgeon: Earnstine Regal, MD;  Location: [facility];  Service: General;  Laterality: N/A;  Exploratory Laparotomy ,Resection Retroperitoneal Mass  . Cardiac catheterization      Social History   Social History  . Marital Status: Divorced    Spouse Name: N/A  . Number of Children: N/A  . Years of Education: N/A    Occupational History  . Not on file.   Social History Main Topics  . Smoking status: Never Smoker   . Smokeless tobacco: Never Used  . Alcohol Use: No  . Drug Use: No  . Sexual Activity: Not on file   Other Topics Concern  . Not on file   Social History Narrative   Lives  in [place] alone.  Retired Quarry manager.     Filed Vitals:   12/18/14 1021  BP: 116/78  Pulse: 50  Height: 5\' 4"  (1.626 m)  Weight: 193 lb (87.544 kg)  SpO2: 98%    PHYSICAL EXAM General: NAD HEENT: Normal. Neck: No JVD, no thyromegaly. Lungs: Clear to auscultation bilaterally with normal respiratory effort. CV: Nondisplaced PMI.  Regular rate and rhythm, normal S1/S2, no S3/S4, no murmur. No pretibial or periankle edema.  No carotid bruit.  Normal pedal pulses.  Abdomen: Soft, nontender, no distention.  Neurologic: Alert and oriented x 3.  Psych: Normal affect. Skin: Normal. Musculoskeletal: Normal range of motion, no gross deformities. Extremities: No clubbing or cyanosis.   ECG: Most recent ECG reviewed.      ASSESSMENT AND PLAN: 1. Chest pain: No recurrences. Low risk stress test. No further cardiac testing is indicated. Normal LV systolic function and regional wall motion. Episodes were likely correspondent with rapid atrial fibrillation.  2. Essential HTN: Controlled. No changes.  3. Hyperlipidemia: On pravastatin 40 mg. No changes.  4. Paroxysmal trial fibrillation: Anticoagulated with Eliquis. No antiarrhythmic drug therapy at this time (evaluated by EP 05/2014).  Dispo: f/u 1 year.  Kate Sable, M.D., F.A.C.C.

## 2015-01-27 ENCOUNTER — Other Ambulatory Visit (HOSPITAL_COMMUNITY): Payer: Medicare Other

## 2015-01-27 ENCOUNTER — Ambulatory Visit (HOSPITAL_COMMUNITY): Payer: Medicare Other | Admitting: Hematology & Oncology

## 2015-02-09 ENCOUNTER — Emergency Department (HOSPITAL_COMMUNITY)
Admission: EM | Admit: 2015-02-09 | Discharge: 2015-02-09 | Disposition: A | Discharge status: Home / Self Care | Payer: Medicare Other | Attending: Emergency Medicine | Admitting: Emergency Medicine

## 2015-02-09 ENCOUNTER — Emergency Department (HOSPITAL_COMMUNITY): Payer: Medicare Other

## 2015-02-09 ENCOUNTER — Encounter (HOSPITAL_COMMUNITY): Payer: Self-pay | Admitting: Emergency Medicine

## 2015-02-09 DIAGNOSIS — G8929 Other chronic pain: Secondary | ICD-10-CM | POA: Insufficient documentation

## 2015-02-09 DIAGNOSIS — Y998 Other external cause status: Secondary | ICD-10-CM | POA: Insufficient documentation

## 2015-02-09 DIAGNOSIS — S0990XA Unspecified injury of head, initial encounter: Secondary | ICD-10-CM | POA: Insufficient documentation

## 2015-02-09 DIAGNOSIS — T07XXXA Unspecified multiple injuries, initial encounter: Secondary | ICD-10-CM

## 2015-02-09 DIAGNOSIS — Y9389 Activity, other specified: Secondary | ICD-10-CM | POA: Insufficient documentation

## 2015-02-09 DIAGNOSIS — S199XXA Unspecified injury of neck, initial encounter: Secondary | ICD-10-CM | POA: Diagnosis not present

## 2015-02-09 DIAGNOSIS — M25531 Pain in right wrist: Secondary | ICD-10-CM | POA: Diagnosis not present

## 2015-02-09 DIAGNOSIS — S8992XA Unspecified injury of left lower leg, initial encounter: Secondary | ICD-10-CM | POA: Insufficient documentation

## 2015-02-09 DIAGNOSIS — S0590XA Unspecified injury of unspecified eye and orbit, initial encounter: Secondary | ICD-10-CM | POA: Diagnosis not present

## 2015-02-09 DIAGNOSIS — F419 Anxiety disorder, unspecified: Secondary | ICD-10-CM | POA: Insufficient documentation

## 2015-02-09 DIAGNOSIS — M6281 Muscle weakness (generalized): Secondary | ICD-10-CM | POA: Diagnosis not present

## 2015-02-09 DIAGNOSIS — S7002XA Contusion of left hip, initial encounter: Secondary | ICD-10-CM | POA: Diagnosis not present

## 2015-02-09 DIAGNOSIS — T148 Other injury of unspecified body region: Secondary | ICD-10-CM | POA: Diagnosis not present

## 2015-02-09 DIAGNOSIS — G43909 Migraine, unspecified, not intractable, without status migrainosus: Secondary | ICD-10-CM | POA: Diagnosis not present

## 2015-02-09 DIAGNOSIS — Z8719 Personal history of other diseases of the digestive system: Secondary | ICD-10-CM | POA: Diagnosis not present

## 2015-02-09 DIAGNOSIS — E039 Hypothyroidism, unspecified: Secondary | ICD-10-CM | POA: Diagnosis not present

## 2015-02-09 DIAGNOSIS — E782 Mixed hyperlipidemia: Secondary | ICD-10-CM | POA: Insufficient documentation

## 2015-02-09 DIAGNOSIS — I10 Essential (primary) hypertension: Secondary | ICD-10-CM | POA: Diagnosis not present

## 2015-02-09 DIAGNOSIS — F5101 Primary insomnia: Secondary | ICD-10-CM | POA: Diagnosis not present

## 2015-02-09 DIAGNOSIS — W19XXXA Unspecified fall, initial encounter: Secondary | ICD-10-CM

## 2015-02-09 DIAGNOSIS — F329 Major depressive disorder, single episode, unspecified: Secondary | ICD-10-CM | POA: Insufficient documentation

## 2015-02-09 DIAGNOSIS — Z85828 Personal history of other malignant neoplasm of skin: Secondary | ICD-10-CM | POA: Insufficient documentation

## 2015-02-09 DIAGNOSIS — W010XXA Fall on same level from slipping, tripping and stumbling without subsequent striking against object, initial encounter: Secondary | ICD-10-CM | POA: Insufficient documentation

## 2015-02-09 DIAGNOSIS — Z7901 Long term (current) use of anticoagulants: Secondary | ICD-10-CM | POA: Insufficient documentation

## 2015-02-09 DIAGNOSIS — R531 Weakness: Secondary | ICD-10-CM

## 2015-02-09 DIAGNOSIS — S79912A Unspecified injury of left hip, initial encounter: Secondary | ICD-10-CM | POA: Insufficient documentation

## 2015-02-09 DIAGNOSIS — R0781 Pleurodynia: Secondary | ICD-10-CM | POA: Diagnosis not present

## 2015-02-09 DIAGNOSIS — J029 Acute pharyngitis, unspecified: Secondary | ICD-10-CM | POA: Diagnosis not present

## 2015-02-09 DIAGNOSIS — S0081XA Abrasion of other part of head, initial encounter: Secondary | ICD-10-CM | POA: Diagnosis not present

## 2015-02-09 DIAGNOSIS — Z7982 Long term (current) use of aspirin: Secondary | ICD-10-CM | POA: Diagnosis not present

## 2015-02-09 DIAGNOSIS — H5711 Ocular pain, right eye: Secondary | ICD-10-CM | POA: Diagnosis not present

## 2015-02-09 DIAGNOSIS — Y92009 Unspecified place in unspecified non-institutional (private) residence as the place of occurrence of the external cause: Secondary | ICD-10-CM | POA: Insufficient documentation

## 2015-02-09 DIAGNOSIS — Z79899 Other long term (current) drug therapy: Secondary | ICD-10-CM | POA: Diagnosis not present

## 2015-02-09 DIAGNOSIS — R05 Cough: Secondary | ICD-10-CM | POA: Insufficient documentation

## 2015-02-09 DIAGNOSIS — S60221A Contusion of right hand, initial encounter: Secondary | ICD-10-CM | POA: Diagnosis not present

## 2015-02-09 DIAGNOSIS — S299XXA Unspecified injury of thorax, initial encounter: Secondary | ICD-10-CM | POA: Diagnosis not present

## 2015-02-09 DIAGNOSIS — S8002XA Contusion of left knee, initial encounter: Secondary | ICD-10-CM | POA: Diagnosis not present

## 2015-02-09 DIAGNOSIS — E669 Obesity, unspecified: Secondary | ICD-10-CM | POA: Diagnosis not present

## 2015-02-09 DIAGNOSIS — Z8701 Personal history of pneumonia (recurrent): Secondary | ICD-10-CM | POA: Insufficient documentation

## 2015-02-09 DIAGNOSIS — S60811A Abrasion of right wrist, initial encounter: Secondary | ICD-10-CM | POA: Insufficient documentation

## 2015-02-09 DIAGNOSIS — M25552 Pain in left hip: Secondary | ICD-10-CM | POA: Diagnosis not present

## 2015-02-09 DIAGNOSIS — R5383 Other fatigue: Secondary | ICD-10-CM | POA: Diagnosis present

## 2015-02-09 DIAGNOSIS — J00 Acute nasopharyngitis [common cold]: Secondary | ICD-10-CM | POA: Diagnosis not present

## 2015-02-09 DIAGNOSIS — D649 Anemia, unspecified: Secondary | ICD-10-CM | POA: Insufficient documentation

## 2015-02-09 DIAGNOSIS — M79641 Pain in right hand: Secondary | ICD-10-CM | POA: Diagnosis not present

## 2015-02-09 LAB — CBC WITH DIFFERENTIAL/PLATELET
BASOS ABS: 0 10*3/uL (ref 0.0–0.1)
Basophils Relative: 0 %
Eosinophils Absolute: 0.1 10*3/uL (ref 0.0–0.7)
Eosinophils Relative: 2 %
HEMATOCRIT: 40 % (ref 36.0–46.0)
Hemoglobin: 13.6 g/dL (ref 12.0–15.0)
LYMPHS PCT: 26 %
Lymphs Abs: 2.1 10*3/uL (ref 0.7–4.0)
MCH: 33.8 pg (ref 26.0–34.0)
MCHC: 34 g/dL (ref 30.0–36.0)
MCV: 99.5 fL (ref 78.0–100.0)
MONO ABS: 1.1 10*3/uL — AB (ref 0.1–1.0)
MONOS PCT: 14 %
NEUTROS ABS: 4.7 10*3/uL (ref 1.7–7.7)
Neutrophils Relative %: 58 %
Platelets: 114 10*3/uL — ABNORMAL LOW (ref 150–400)
RBC: 4.02 MIL/uL (ref 3.87–5.11)
RDW: 12.6 % (ref 11.5–15.5)
WBC: 8 10*3/uL (ref 4.0–10.5)

## 2015-02-09 LAB — COMPREHENSIVE METABOLIC PANEL
ALK PHOS: 65 U/L (ref 38–126)
ALT: 13 U/L — AB (ref 14–54)
AST: 19 U/L (ref 15–41)
Albumin: 3.5 g/dL (ref 3.5–5.0)
Anion gap: 7 (ref 5–15)
BILIRUBIN TOTAL: 0.5 mg/dL (ref 0.3–1.2)
BUN: 15 mg/dL (ref 6–20)
CHLORIDE: 102 mmol/L (ref 101–111)
CO2: 29 mmol/L (ref 22–32)
Calcium: 9.2 mg/dL (ref 8.9–10.3)
Creatinine, Ser: 0.96 mg/dL (ref 0.44–1.00)
GFR, EST NON AFRICAN AMERICAN: 56 mL/min — AB (ref >60)
GLUCOSE: 86 mg/dL (ref 65–99)
POTASSIUM: 4 mmol/L (ref 3.5–5.1)
Sodium: 138 mmol/L (ref 135–145)
Total Protein: 6.8 g/dL (ref 6.5–8.1)

## 2015-02-09 LAB — URINALYSIS, ROUTINE W REFLEX MICROSCOPIC
Bilirubin Urine: NEGATIVE
GLUCOSE, UA: NEGATIVE mg/dL
Ketones, ur: NEGATIVE mg/dL
Leukocytes, UA: NEGATIVE
Nitrite: NEGATIVE
PH: 7.5 (ref 5.0–8.0)
PROTEIN: NEGATIVE mg/dL
Specific Gravity, Urine: 1.01 (ref 1.005–1.030)

## 2015-02-09 LAB — RAPID STREP SCREEN (MED CTR MEBANE ONLY): STREPTOCOCCUS, GROUP A SCREEN (DIRECT): NEGATIVE

## 2015-02-09 LAB — URINE MICROSCOPIC-ADD ON

## 2015-02-09 MED ORDER — SODIUM CHLORIDE 0.9 % IV BOLUS (SEPSIS)
500.0000 mL | Freq: Once | INTRAVENOUS | Status: AC
  Administered 2015-02-09: 500 mL via INTRAVENOUS

## 2015-02-09 MED ORDER — ACETAMINOPHEN 325 MG PO TABS: 650.0000 mg | ORAL_TABLET | Freq: Once | ORAL | Status: DC

## 2015-02-09 NOTE — ED Provider Notes (Signed)
CSN: [id number]     Arrival date & time 02/09/15  1647 History   First MD Initiated Contact with Patient 02/09/15 1714     Chief Complaint  Patient presents with  . Fall  . Fatigue  . Sore Throat  . Cough      HPI  Pt was seen at 1740. Per pt, c/o sudden onset and resolution of one episode of fall that occurred today PTA. Pt states for the past 1 week she has had sore throat, cough, generalized weakness/fatigue. Pt was evaluated by her PMD today for these symptoms, dx viral illness and was told "it wasn't the flu." Pt states she was laying down at home, got up to walk down the hallway and fell down. Pt states she "just felt weak" before she fell. Pt c/o head injury, right hand pain, left hip pain, left knee pain, left ribs pain. Denies syncope/LOC, no AMS, no change in her chronic back pain, no CP/palpitations, no SOB, no abd pain, no N/V/D, no fevers, no rash, no focal motor weakness, no tingling/numbness in extremities.    Past Medical History  Diagnosis Date  . Essential hypertension   . Mixed hyperlipidemia   . Obesity   . GERD (gastroesophageal reflux disease)   . Hypothyroidism   . Migraines   . Osteoarthritis of knee   . IBS (irritable colon syndrome)   . Ocular myasthenia gravis ([place])   . Complication of anesthesia     Low blood pressure, heart rate, O2 sat  . PAF (paroxysmal atrial fibrillation) (H[facility])     chads2vasc score is at least 4  . History of pneumonia   . Skin cancer   . Anemia   . Diverticulosis   . Chronic back pain   . Chronic nausea   . Paraganglioma ([place])     Resection 02/2012 (retroperitoneal)  . Depression with anxiety   . LVH (left ventricular hypertrophy)    Past Surgical History  Procedure Laterality Date  . Knee surgery    . Total knee arthroplasty Bilateral     2005 and 2011  . Abdominal hysterectomy    . Cholecystectomy    . Nasal sinus surgery      30 yrs ago  . Colonoscopy  08/24/2011    Procedure: COLONOSCOPY;  Surgeon: Rogene Houston, MD;  Location: [facility];  Service: Endoscopy;  Laterality: N/A;  1200  . Ct guided biopsy  01/16/12  . Givens capsule study  01/29/2012    Procedure: GIVENS CAPSULE STUDY;  Surgeon: Rogene Houston, MD;  Location: [facility];  Service: Endoscopy;  Laterality: N/A;  730  . Laparotomy  03/01/2012    Procedure: EXPLORATORY LAPAROTOMY;  Surgeon: Earnstine Regal, MD;  Location: [facility];  Service: General;  Laterality: N/A;  Exploratory Laparotomy ,Resection Retroperitoneal Mass  . Cardiac catheterization    . Abdominal hysterectomy     Family History  Problem Relation Age of Onset  . Inflammatory bowel disease Maternal Grandmother    Social History  Substance Use Topics  . Smoking status: Never Smoker   . Smokeless tobacco: Never Used  . Alcohol Use: No    Review of Systems ROS: Statement: All systems negative except as marked or noted in the HPI; Constitutional: Negative for fever and chills. +generalized weakness/fatigue.; ; Eyes: Negative for eye pain, redness and discharge. ; ; ENMT: Negative for ear pain, hoarseness, nasal congestion, sinus pressure and +sore throat. ; ; Cardiovascular: Negative for chest pain, palpitations,  diaphoresis, dyspnea and peripheral edema. ; ; Respiratory: +cough. Negative for wheezing and stridor. ; ; Gastrointestinal: Negative for nausea, vomiting, diarrhea, abdominal pain, blood in stool, hematemesis, jaundice and rectal bleeding. . ; ; Genitourinary: Negative for dysuria, flank pain and hematuria. ; ; Musculoskeletal: +right hand pain, left hip pain, left knee pain, left ribs pain, head injury. Negative for back pain and neck pain. Negative for swelling and deformity.; ; Skin: +bruising, abrasions. Negative for pruritus, rash, blisters and skin lesion.; ; Neuro: Negative for headache, lightheadedness and neck stiffness. Negative for weakness, altered level of consciousness , altered mental status, extremity weakness, paresthesias, involuntary  movement, seizure and syncope.      Allergies  Tylox; Avocado; Macrodantin; Other; Tetracyclines & related; Ampicillin; Biaxin; and Codeine  Home Medications   Prior to Admission medications   Medication Sig Start Date End Date Taking? Authorizing Provider  ALPRAZolam Duanne Moron) 1 MG tablet Take 1 mg by mouth at bedtime as needed for sleep. HS as needed and can be repeated x 1 if needed. insomnia    Historical Provider, MD  amitriptyline (ELAVIL) 25 MG tablet Take 25 mg by mouth at bedtime.    Historical Provider, MD  apixaban (ELIQUIS) 5 MG TABS tablet Take 1 tablet (5 mg total) by mouth 2 (two) times daily. 04/05/14   Liliane Shi, PA-C  aspirin EC 81 MG tablet Take 81 mg by mouth daily.    Historical Provider, MD  Calcium Carbonate-Vit D-Min (CALTRATE 600+D PLUS MINERALS PO) Take 1 tablet by mouth 2 (two) times daily.     Historical Provider, MD  Coenzyme Q10 (CO Q 10) 10 MG CAPS Take 10 mg by mouth daily.    Historical Provider, MD  docusate sodium (COLACE) 100 MG capsule Take 100 mg by mouth 2 (two) times daily.    Historical Provider, MD  Glucosamine 500 MG CAPS Take 1,000 mg by mouth daily.    Historical Provider, MD  HYDROcodone-acetaminophen (NORCO) 10-325 MG per tablet Take 1 tablet by mouth every 6 (six) hours as needed (pain).     Historical Provider, MD  Iron-Vit C-Vit B12-Folic Acid (IRON 123XX123 PLUS PO) Take 65 mg by mouth.    Historical Provider, MD  lansoprazole (PREVACID) 30 MG capsule TAKE (1) CAPSULE BY MOUTH TWICE DAILY. 09/30/14   Butch Penny, NP  levothyroxine (SYNTHROID, LEVOTHROID) 112 MCG tablet Take 100 mcg by mouth daily before breakfast.     Historical Provider, MD  lidocaine (LIDODERM) 5 % Place 1-3 patches onto the skin daily as needed. Remove & Discard patch within 12 hours or as directed by MD for pain    Historical Provider, MD  lisinopril (PRINIVIL,ZESTRIL) 5 MG tablet Take 5 mg by mouth daily.     Historical Provider, MD  Magnesium 250 MG TABS Take 250 mg by  mouth daily. Takes up to twice daily as needed for muscle cramps    Historical Provider, MD  methocarbamol (ROBAXIN) 500 MG tablet Take 500 mg by mouth daily as needed. Muscle spasms    Historical Provider, MD  Multiple Vitamin (MULTIVITAMIN WITH MINERALS) TABS Take 1 tablet by mouth daily.    Historical Provider, MD  NON FORMULARY Apply 1 application topically 2 (two) times daily. Nail clear from dermatologist    Historical Provider, MD  Omega-3 Fatty Acids (FISH OIL) 1200 MG CAPS Take 1,200 mg by mouth daily.     Historical Provider, MD  ondansetron (ZOFRAN) 4 MG tablet Take 4 mg by mouth daily as  needed for nausea or vomiting.  03/28/14   Historical Provider, MD  pravastatin (PRAVACHOL) 80 MG tablet Take 80 mg by mouth daily.    Historical Provider, MD  Probiotic Product (ALIGN PO) Take by mouth daily.    Historical Provider, MD  propranolol (INDERAL) 40 MG tablet Take 20 mg by mouth 2 (two) times daily.    Historical Provider, MD  pyridostigmine (MESTINON) 60 MG tablet Take 30 mg by mouth 3 (three) times daily. Take 60 mg by mouth in the AM, take 30 mg by mouth midday and take 30 mg by mouth at bedtime    Historical Provider, MD  SUMAtriptan (IMITREX) 50 MG tablet Take 50 mg by mouth every 2 (two) hours as needed. For migraines    Historical Provider, MD   BP 132/58 mmHg  Pulse 57  Temp(Src) 100.4 F (38 C) (Oral)  Resp 16  Ht 5\' 4"  (1.626 m)  Wt 193 lb (87.544 kg)  BMI 33.11 kg/m2  SpO2 96% Physical Exam  1745: Physical examination: Vital signs and O2 SAT: Reviewed; Constitutional: Well developed, Well nourished, Well hydrated, In no acute distress; Head and Face: Normocephalic, +very superficial abrasion right forehead.; Eyes: EOMI, PERRL, No scleral icterus; ENMT: Mouth and pharynx normal, Mucous membranes moist; Neck: Supple, Trachea midline; Spine: No midline CS, TS, LS tenderness.; Cardiovascular: Regular rate and rhythm, No gallop; Respiratory: Breath sounds clear & equal bilaterally,  No wheezes, Normal respiratory effort/excursion; Chest: +TTP left lower anterior-lateral chest wall. No deformity, Movement normal, No crepitus, No abrasions or ecchymosis.; Abdomen: Soft, Nontender, Nondistended, Normal bowel sounds, No abrasions or ecchymosis.; Genitourinary: No CVA tenderness;; Extremities: +mild TTP left hip, no deformity, NMS intact distal LLE. +FROM left knee, including able to lift extended LLE off stretcher, and extend left lower leg against resistance.  No ligamentous laxity.  No patellar or quad tendon step-offs.  NMS intact left foot, strong pedal pp. +plantarflexion of left foot w/calf squeeze.  No palpable gap left Achilles's tendon.  No proximal fibular head tenderness.  No edema, erythema, warmth, ecchymosis or deformity. NT left ankle/foot. +mild TTP ecchymosis right thenar eminence with superficial abrasion to volar right wrist. No snuffbox tenderness.  No pain to axial thumb or 3rd MCP loading.  Forearm compartments soft, strong radial pp, brisk cap refill in fingers. Hand NMS intact. NT right fingers/elbow/shoulder.  No deformity, Full range of motion major/large joints of bilat UE's and LE's without pain or tenderness to palp, Neurovascularly intact, Pulses normal, No edema, Pelvis stable; Neuro: AA&Ox3, GCS 15.  Major CN grossly intact. Speech clear. No gross focal motor or sensory deficits in extremities.; Skin: Color normal, Warm, Dry    ED Course  Procedures (including critical care time) Labs Review   Imaging Review  I have personally reviewed and evaluated these images and lab results as part of my medical decision-making.   EKG Interpretation None      MDM  MDM Reviewed: previous chart, nursing note and vitals Reviewed previous: labs Interpretation: labs and x-ray     Results for orders placed or performed during the hospital encounter of 02/09/15  Rapid strep screen  Result Value Ref Range   Streptococcus, Group A Screen (Direct) NEGATIVE  NEGATIVE  Urinalysis, Routine w reflex microscopic  Result Value Ref Range   Color, Urine YELLOW YELLOW   APPearance CLEAR CLEAR   Specific Gravity, Urine 1.010 1.005 - 1.030   pH 7.5 5.0 - 8.0   Glucose, UA NEGATIVE NEGATIVE mg/dL   Hgb  urine dipstick TRACE (A) NEGATIVE   Bilirubin Urine NEGATIVE NEGATIVE   Ketones, ur NEGATIVE NEGATIVE mg/dL   Protein, ur NEGATIVE NEGATIVE mg/dL   Nitrite NEGATIVE NEGATIVE   Leukocytes, UA NEGATIVE NEGATIVE  Comprehensive metabolic panel  Result Value Ref Range   Sodium 138 135 - 145 mmol/L   Potassium 4.0 3.5 - 5.1 mmol/L   Chloride 102 101 - 111 mmol/L   CO2 29 22 - 32 mmol/L   Glucose, Bld 86 65 - 99 mg/dL   BUN 15 6 - 20 mg/dL   Creatinine, Ser 0.96 0.44 - 1.00 mg/dL   Calcium 9.2 8.9 - 10.3 mg/dL   Total Protein 6.8 6.5 - 8.1 g/dL   Albumin 3.5 3.5 - 5.0 g/dL   AST 19 15 - 41 U/L   ALT 13 (L) 14 - 54 U/L   Alkaline Phosphatase 65 38 - 126 U/L   Total Bilirubin 0.5 0.3 - 1.2 mg/dL   GFR calc non Af Amer 56 (L) >60 mL/min   GFR calc Af Amer >60 >60 mL/min   Anion gap 7 5 - 15  CBC with Differential  Result Value Ref Range   WBC 8.0 4.0 - 10.5 K/uL   RBC 4.02 3.87 - 5.11 MIL/uL   Hemoglobin 13.6 12.0 - 15.0 g/dL   HCT 40.0 36.0 - 46.0 %   MCV 99.5 78.0 - 100.0 fL   MCH 33.8 26.0 - 34.0 pg   MCHC 34.0 30.0 - 36.0 g/dL   RDW 12.6 11.5 - 15.5 %   Platelets 114 (L) 150 - 400 K/uL   Neutrophils Relative % 58 %   Neutro Abs 4.7 1.7 - 7.7 K/uL   Lymphocytes Relative 26 %   Lymphs Abs 2.1 0.7 - 4.0 K/uL   Monocytes Relative 14 %   Monocytes Absolute 1.1 (H) 0.1 - 1.0 K/uL   Eosinophils Relative 2 %   Eosinophils Absolute 0.1 0.0 - 0.7 K/uL   Basophils Relative 0 %   Basophils Absolute 0.0 0.0 - 0.1 K/uL  Urine microscopic-add on  Result Value Ref Range   Squamous Epithelial / LPF 0-5 (A) NONE SEEN   WBC, UA 0-5 0 - 5 WBC/hpf   RBC / HPF 0-5 0 - 5 RBC/hpf   Bacteria, UA RARE (A) NONE SEEN   Dg Ribs Unilateral W/chest  Left 02/09/2015  CLINICAL DATA:  Slipped and fell at home EXAM: LEFT RIBS AND CHEST - 3+ VIEW COMPARISON:  04/27/2014 FINDINGS: No fracture or other bone lesions are seen involving the ribs. There is no evidence of pneumothorax or pleural effusion. Both lungs are clear. Heart size and mediastinal contours are within normal limits. IMPRESSION: Negative. Electronically Signed   By: Kerby Moors M.D.   On: 02/09/2015 18:55   Dg Wrist Complete Right 02/09/2015  CLINICAL DATA:  Pt slipped and fell in her home while trying to answer her door. Pt c/o Lt anterior rib pain under Lt breast, Lt hip pain, Lt knee pain, Rt hand pain, and Rt wrist pain. Hx arthritis. Pt states ring can only be removed if cut off. EXAM: RIGHT WRIST - COMPLETE 3+ VIEW COMPARISON:  None. FINDINGS: Negative for fracture. Carpal rows intact. Advanced erosive degenerative change at the first carpometacarpal articulation with small marginal spurs and subchondral sclerosis. Mild osteoarthritic changes at the first MCP joint. Mild diffuse osteopenia. IMPRESSION: 1. Negative for fracture or other acute bone abnormality. 2. Degenerative changes as above. Electronically Signed   By: Lucrezia Europe  M.D.   On: 02/09/2015 18:52   Ct Head Wo Contrast 02/09/2015  CLINICAL DATA:  [place], cough EXAM: CT HEAD WITHOUT CONTRAST CT CERVICAL SPINE WITHOUT CONTRAST TECHNIQUE: Multidetector CT imaging of the head and cervical spine was performed following the standard protocol without intravenous contrast. Multiplanar CT image reconstructions of the cervical spine were also generated. COMPARISON:  04/17/2014 and previous FINDINGS: CT HEAD FINDINGS Mucoperiosteal thickening in the right maxillary sinus. Previous maxillary antrectomy. Patchy opacification of right ethmoid air cells. Mild atrophy. There is no evidence of acute intracranial hemorrhage, brain edema, mass lesion, acute infarction, mass effect, or midline shift. Acute infarct may be inapparent on noncontrast  CT. No other intra-axial abnormalities are seen, and the ventricles and sulci are within normal limits in size and symmetry. No abnormal extra-axial fluid collections or masses are identified. No significant calvarial abnormality. CT CERVICAL SPINE FINDINGS Normal alignment. Moderate narrowing of C3-4, C5-6, and C6-7 interspaces with anterior and posterior endplate spurs. Facets are seated. No prevertebral soft tissue swelling. Negative for fracture. Visualized lung apices clear. Heavily calcified left thyroid lesion, incompletely visualized. Bilateral calcified carotid bifurcation plaque. IMPRESSION: 1. Negative for bleed or other acute intracranial process. 2. No cervical fracture or other acute bone abnormality. 3. Multilevel cervical spondylitic changes as enumerated above. 4. Right maxillary and ethmoid sinus disease. Electronically Signed   By: Lucrezia Europe M.D.   On: 02/09/2015 18:31   Ct Cervical Spine Wo Contrast 02/09/2015  CLINICAL DATA:  [place], cough EXAM: CT HEAD WITHOUT CONTRAST CT CERVICAL SPINE WITHOUT CONTRAST TECHNIQUE: Multidetector CT imaging of the head and cervical spine was performed following the standard protocol without intravenous contrast. Multiplanar CT image reconstructions of the cervical spine were also generated. COMPARISON:  04/17/2014 and previous FINDINGS: CT HEAD FINDINGS Mucoperiosteal thickening in the right maxillary sinus. Previous maxillary antrectomy. Patchy opacification of right ethmoid air cells. Mild atrophy. There is no evidence of acute intracranial hemorrhage, brain edema, mass lesion, acute infarction, mass effect, or midline shift. Acute infarct may be inapparent on noncontrast CT. No other intra-axial abnormalities are seen, and the ventricles and sulci are within normal limits in size and symmetry. No abnormal extra-axial fluid collections or masses are identified. No significant calvarial abnormality. CT CERVICAL SPINE FINDINGS Normal alignment. Moderate  narrowing of C3-4, C5-6, and C6-7 interspaces with anterior and posterior endplate spurs. Facets are seated. No prevertebral soft tissue swelling. Negative for fracture. Visualized lung apices clear. Heavily calcified left thyroid lesion, incompletely visualized. Bilateral calcified carotid bifurcation plaque. IMPRESSION: 1. Negative for bleed or other acute intracranial process. 2. No cervical fracture or other acute bone abnormality. 3. Multilevel cervical spondylitic changes as enumerated above. 4. Right maxillary and ethmoid sinus disease. Electronically Signed   By: Lucrezia Europe M.D.   On: 02/09/2015 18:31   Dg Knee Complete 4 Views Left 02/09/2015  CLINICAL DATA:  Slipped and fell. EXAM: LEFT KNEE - COMPLETE 4+ VIEW COMPARISON:  None. FINDINGS: Previous left knee arthroplasty. The hardware components are in anatomic alignment and no complications identified. No periprosthetic fracture or subluxation. IMPRESSION: 1. No acute findings. 2. Previous left knee arthroplasty. Electronically Signed   By: Kerby Moors M.D.   On: 02/09/2015 18:50   Dg Hand Complete Right 02/09/2015  CLINICAL DATA:  Pt slipped and fell in her home while trying to answer her door. Pt c/o Lt anterior rib pain under Lt breast, Lt hip pain, Lt knee pain, Rt hand pain, and Rt wrist pain. Hx arthritis. Pt states  ring can only be removed if cut off. EXAM: RIGHT HAND - COMPLETE 3+ VIEW COMPARISON:  None. FINDINGS: Mild diffuse osteopenia. Negative for fracture. No dislocation. Advanced DJD at the first carpometacarpal articulation, with subluxation. Advanced degenerative change at the little finger DIP joint, held in partial flexion. Carpal rows intact. Mild osteoarthritic change at the first MCP joint. IMPRESSION: 1. Negative for fracture or other acute bone abnormality. 2. Degenerative changes as above. Electronically Signed   By: Lucrezia Europe M.D.   On: 02/09/2015 18:51   Dg Hip Unilat With Pelvis 2-3 Views Left 02/09/2015  CLINICAL  DATA:  Pt slipped and fell in her home while trying to answer her door. Pt c/o Lt anterior rib pain under Lt breast, Lt hip pain, Lt knee pain, Rt hand pain, and Rt wrist pain. Hx arthritis. Pt states ring can only be removed if cut off. EXAM: DG HIP (WITH OR WITHOUT PELVIS) 2-3V LEFT COMPARISON:  None. FINDINGS: There is no evidence of hip fracture or dislocation. There is no evidence of arthropathy or other focal bone abnormality. Mild lumbar levoscoliosis is suspected. Surgical clips in the left central abdomen. Mild osteitis pubis. IMPRESSION: 1. Negative left hip. Electronically Signed   By: Lucrezia Europe M.D.   On: 02/09/2015 18:50    2040:  Pt was initially orthostatic on VS. IVF given. Pt states she "feels much better now." Pt has tol PO well without N/V. Pt has ambulated with steady gait, easy resps, NAD. VSS, abd remains benign. Pt states she wants to go home now. Workup otherwise reassuring; tx symptomatically. Dx and testing d/w pt and family.  Questions answered.  Verb understanding, agreeable to d/c home with outpt f/u.    Francine Graven, DO 02/11/15 2054

## 2015-02-09 NOTE — ED Notes (Signed)
Pt refused Tylenol at this time.

## 2015-02-09 NOTE — ED Notes (Signed)
Pt ambulated hall at this time with ease, pt denies dizziness or weakness, gait was steady. MD notified.

## 2015-02-09 NOTE — Discharge Instructions (Signed)
°Emergency Department Resource Guide °1) Find a Doctor and Pay Out of Pocket °Although you won't have to find out who is covered by your insurance plan, it is a good idea to ask around and get recommendations. You will then need to call the office and see if the doctor you have chosen will accept you as a new patient and what types of options they offer for patients who are self-pay. Some doctors offer discounts or will set up payment plans for their patients who do not have insurance, but you will need to ask so you aren't surprised when you get to your appointment. ° °2) Contact Your Local Health Department °Not all health departments have doctors that can see patients for sick visits, but many do, so it is worth a call to see if yours does. If you don't know where your local health department is, you can check in your phone book. The CDC also has a tool to help you locate your state's health department, and many state websites also have listings of all of their local health departments. ° °3) Find a Walk-in Clinic °If your illness is not likely to be very severe or complicated, you may want to try a walk in clinic. These are popping up all over the country in pharmacies, drugstores, and shopping centers. They're usually staffed by nurse practitioners or physician assistants that have been trained to treat common illnesses and complaints. They're usually fairly quick and inexpensive. However, if you have serious medical issues or chronic medical problems, these are probably not your best option. ° °No Primary Care Doctor: °- Call [facility] at  832-8000 - they can help you locate a primary care doctor that  accepts your insurance, provides certain services, etc. °- Physician Referral Service- 1-800-533-3463 ° °Chronic Pain Problems: °Organization         Address  Phone   Notes  °[facility]  (336) 297-2271 Patients need to be referred by their primary care doctor.  ° °Medication  Assistance: °Organization         Address  Phone   Notes  °[place] [facility] [address] °[place], [place] [address] (336) 641-8030 --Must be a resident of [place] °-- Must have NO insurance coverage whatsoever (no Medicaid/ Medicare, etc.) °-- The pt. MUST have a primary care doctor that directs their care regularly and follows them in the community °  °[facility]  (866) 331-1348   °[employer]  (888) 892-1162   ° °Agencies that provide inexpensive medical care: °Organization         Address  Phone   Notes  °[facility]  (336) 832-8035   °[facility]    (336) 832-7272   °[facility] [address] °[place], [place] [address] (336) 832-4777   °[facility] [address], °[place] (336) 271-4999   °[facility]    (336) 373-0678   °[facility]    (336) 272-1050   °[facility] ° [address], [place] Phone:  (336) 832-4444, Fax:  (336) 832-4440 Hours of Operation:  9 am - 6 pm, M-F.  Also accepts Medicaid/Medicare and self-pay.  °[facility] ° [address], [place] Phone: (336) 832-3150, Fax: (336) [phone]. Hours of Operation:  8:30 am - 5:30 pm, M-F.  Also accepts Medicaid and self-pay.  °[facility] [place] [address], [place] Phone: (336) 878-6027   °[facility] [address], [place], [place] (336)723-1848, Ext. 123 Mondays & Thursdays: 7-9 AM.  First 15 patients are seen on a first come, first serve basis. °  ° °Medicaid-accepting [place] Providers: ° °Organization         Address  Phone   Notes  °[facility] [address], [place] (336) 641-2100 Also accepts self-pay patients.  °[facility] [address], [place] ° (336) 856-9996   °[facility] [address], [place]  (336) 288-8857   °[facility] [address] [place] [address], [place] (336) 299-7000   °[facility] [address], [place]  ° (336) 373-1557 Only accepts [place] Access Medicaid patients after they have their name applied to their card.  ° °Self-Pay (no insurance) in [place]: ° °Organization         Address  Phone   Notes  °Sickle Cell Patients, [facility] [address], [place] (336) 832-1970   °[facility] [address], [place] (336) 832-4400   °[facility] ° [address], [place] (336) 992-4800   °[facility]/Dr. Osei-Bonsu ° [address] [place] [address], [place] or [address], [place] (336) [phone] Phone number for both [place] and [place] locations is the same.  °[facility] [address], [place] (336) 299-0000   °[facility] [address] [place] [address], [place] or [address] (336) 852-7530 °(336) 878-2260   °[facility] [address], [place] (336) 350-1642, phone; (336) 294-5005, fax Sees patients 1st and 3rd Saturday of every month.  Must not qualify for public or private insurance (i.e. Medicaid, Medicare, [place] Health Choice, Veterans' Benefits) • Household income should be no more than 200% of the poverty level •The clinic cannot treat you if you are pregnant or think you are pregnant • Sexually transmitted diseases are not treated at the clinic.  ° ° °Dental Care: °Organization         Address  Phone  Notes  °[place] [facility] [address], [place] (336) 641-6152 Accepts children up to age 21 who are enrolled in Medicaid or [place] Health Choice; pregnant women with a Medicaid card; and children who have applied for Medicaid or [place] Health Choice, but were declined, whose parents can pay a reduced fee at time of service.  °[place]  [facility] [place]  [address], [place] (336) 641-7733 Accepts children up to age 21 who are enrolled in Medicaid or [place] Health Choice; pregnant women with a Medicaid card; and children who have applied for Medicaid or  Health Choice, but were declined, whose parents can pay a reduced fee at time of service.  °[facility] ° [address], [place] (336) 641-4533 Patients are seen by appointment only. Walk-ins are not accepted. [facility] will see patients 18 years of age and older. °Monday - Tuesday (8am-5pm) °Most Wednesdays (8:30-5pm) °$30 per visit, cash only  °[facility] ° [address], [place] (336) 641-4533 Patients are seen by appointment only. Walk-ins are not accepted. [facility] will see patients 18 years of age and older. °One   Wednesday Evening (Monthly: Volunteer Based).  $30 per visit, cash only  °[facility]  (919) 537-3737 for adults; Children under age 4, call Graduate Pediatric Dentistry at ([phone]) [phone]. Children aged 4-14, please call (919) [phone] to request a pediatric application. ° Dental services are provided in all areas of dental care including fillings, crowns and bridges, complete and partial dentures, implants, gum treatment, root canals, and extractions. Preventive care is also provided. Treatment is provided to both adults and children. °Patients are selected via a lottery and there is often a waiting list. °  °[facility] [address], °[place] ° (336) 763-8833 www.drcivils.[email or web address] °  °[facility] [address], [place], [place] (336)723-1848, Ext. 123 Second and Fourth Thursday of each month, opens at 6:30 AM; Clinic ends at 9 AM.  Patients are seen on a first-come first-served basis, and a limited number are seen during each clinic.  ° °[facility] ° [address], [place], [place] (336) 723-7904    Eligibility Requirements °You must have lived in [place], [place], or [place] counties for at least the last three months. °  You cannot be eligible for state or federal sponsored healthcare insurance, including Veterans Administration, Medicaid, or Medicare. °  You generally cannot be eligible for healthcare insurance through your employer.  °  How to apply: °Eligibility screenings are held every Tuesday and Wednesday afternoon from 1:00 pm until 4:00 pm. You do not need an appointment for the interview!  °[facility] [address], [place], [place] 336-631-2330   °[facility]  336-342-8273   °[facility]  336-703-3100   °[facility]  336-570-6415   ° °Behavioral Health Resources in the Community: °Intensive Outpatient Programs °Organization         Address  Phone  Notes  °[facility] [address], [place], [place] 336-878-6098   °[facility] [address], [place], [place] 336-832-9800   °[facility] [address], [place], [place] ° 336-882-2125   °[facility] [address],  °[place], [place] 1-800-853-5163 or 336-641-4981   °Substance Abuse Resources °Organization         Address  Phone  Notes  °[facility]  336-882-2125   °[facility]  336-784-9470   °[facility]  336-285-9073   °[facility]  336-845-3988   °Residential & Outpatient Substance Abuse Program  1-800-659-3381   °Psychological Services °Organization         Address  Phone  Notes  °[facility]  336- 832-9600   °[facility]  336- 378-7881   °[facility] [address], [place] 1-800-853-5163 or 336-641-4981   ° °Mobile Crisis Teams °Organization         Address  Phone  Notes  °[facility], Mobile Crisis Care Unit  1-877-626-1772   °Assertive °[facility] ° [address]  [place], [place] 336-834-9664   °Sharon DeEsch [address] °[place]  336-554-5454   ° °Self-Help/Support Groups °Organization         Address  Phone             Notes  °[facility]  - variety of support groups  336- 373-1402 Call for more information  °Narcotics Anonymous (NA), [facility] [address], °[place]   2 meetings at this location  ° °  Residential Treatment Programs Organization         Address  Phone  Notes  [facility] [address]    [place]  1-212-317-6617   [facility]  [address] [place] [id number], [place], [place]   [place], [place] [phone] Admissions: 8am-3pm M-F  [facility] [place] [address]    [place], [place] [phone]   [facility] [address] [place], [place], [place]   [facility] [address],  [place], [place]   [facility] Intensive Outpatient [address], [place], [place]   [facility]  [place], [place] 1-6130763890 or 613-159-9346   [facility] ([facility] [address] [place], [place] Accepts Medicaid  [facility] [address],  [place] 1-226-287-2946 Substance Abuse/Addiction Treatment   [facility]         Address  Phone  Notes  [facility]  803-456-8142   Domenic Schwab, PhD [address] [place], [place]   (561)271-0478 or (878) 158-3398   [place], [place] 424-439-5602   [facility] [address] [place], [place] (229)328-7053 Insurance/Medicaid/sponsorship through [facility] [address], [place]                                    [place], [place] (580)610-2389 [place] [address], [place] [phone]    Dr. Adele Schilder  702-720-7140   [facility] Dept. 1) [address] [place] [address], [place] 2) [place] 3)  [place] [address], [place] ([phone] 6707729958  ([phone]   [place] (816)790-4296 or [phone] (After Hours)     Take your usual prescriptions as previously directed.  Increase your fluid intake (ie:  Gatoraide) for the next few days, as discussed.  Eat a bland diet and advance to your regular diet slowly as you can tolerate it. Apply moist heat or ice to the area(s) of discomfort, for 15 minutes at a time, several times per day for the next few days.  Do not fall asleep on a heating or ice pack. Call your regular medical doctor tomorrow to schedule a follow up appointment in the next 2 days.  Return to the Emergency Department immediately sooner if worsening.

## 2015-02-09 NOTE — ED Notes (Addendum)
Pt tripped and fell at home. Pt c/o pain to left hip, left hip, left rib cage, right forehead and right hand. Small abrasion to above right eyebrow noted. Pt denies loc.  Pt is on blood thinner-eliquis.

## 2015-02-09 NOTE — ED Notes (Signed)
Pt tolerating PO fluids at this time.

## 2015-02-10 LAB — URINE CULTURE

## 2015-02-11 LAB — CULTURE, GROUP A STREP: STREP A CULTURE: NEGATIVE

## 2015-03-02 DIAGNOSIS — M25531 Pain in right wrist: Secondary | ICD-10-CM | POA: Diagnosis not present

## 2015-03-16 ENCOUNTER — Other Ambulatory Visit (HOSPITAL_COMMUNITY): Payer: Self-pay | Admitting: Internal Medicine

## 2015-03-16 ENCOUNTER — Telehealth: Payer: Self-pay | Admitting: *Deleted

## 2015-03-16 ENCOUNTER — Ambulatory Visit (HOSPITAL_COMMUNITY)
Admission: RE | Admit: 2015-03-16 | Discharge: 2015-03-16 | Disposition: A | Discharge status: Home / Self Care | Payer: Medicare Other | Source: Ambulatory Visit | Attending: Internal Medicine | Admitting: Internal Medicine

## 2015-03-16 DIAGNOSIS — R531 Weakness: Secondary | ICD-10-CM | POA: Diagnosis not present

## 2015-03-16 DIAGNOSIS — M79642 Pain in left hand: Secondary | ICD-10-CM | POA: Diagnosis not present

## 2015-03-16 DIAGNOSIS — M25531 Pain in right wrist: Secondary | ICD-10-CM | POA: Diagnosis not present

## 2015-03-16 DIAGNOSIS — Z23 Encounter for immunization: Secondary | ICD-10-CM | POA: Diagnosis not present

## 2015-03-16 DIAGNOSIS — R0602 Shortness of breath: Secondary | ICD-10-CM | POA: Diagnosis not present

## 2015-03-16 DIAGNOSIS — F5101 Primary insomnia: Secondary | ICD-10-CM | POA: Diagnosis not present

## 2015-03-16 DIAGNOSIS — M25532 Pain in left wrist: Secondary | ICD-10-CM | POA: Diagnosis not present

## 2015-03-16 DIAGNOSIS — M1990 Unspecified osteoarthritis, unspecified site: Secondary | ICD-10-CM | POA: Diagnosis not present

## 2015-03-16 DIAGNOSIS — Z9181 History of falling: Secondary | ICD-10-CM | POA: Diagnosis not present

## 2015-03-16 DIAGNOSIS — M069 Rheumatoid arthritis, unspecified: Secondary | ICD-10-CM | POA: Diagnosis not present

## 2015-03-16 DIAGNOSIS — M19042 Primary osteoarthritis, left hand: Secondary | ICD-10-CM | POA: Diagnosis not present

## 2015-03-16 DIAGNOSIS — E039 Hypothyroidism, unspecified: Secondary | ICD-10-CM | POA: Diagnosis not present

## 2015-03-16 NOTE — Telephone Encounter (Signed)
Patient given 3 sample boxes of 5 mg Eliquis. Lot # [id number] EXP: 07/2017

## 2015-04-01 ENCOUNTER — Telehealth: Payer: Self-pay | Admitting: Cardiovascular Disease

## 2015-04-01 NOTE — Telephone Encounter (Signed)
Dentist Dr. Delcie Roch called stating the pt is having a tooth extracted on Monday. Dr. Delcie Roch would like to know when the pt needs to go off her Eliquis or what she needs to do in order to have this done. Dr. Delcie Roch would like someone to call his office back at [phone] and give instructions and also call The pt and inform her of what to do

## 2015-04-01 NOTE — Telephone Encounter (Signed)
Voice mail only at dentist office,left detailed MD message.I also spoke with patient and gave her instructions

## 2015-04-01 NOTE — Telephone Encounter (Signed)
Can hold Eliquis 48 hours beforehand. Resume as soon as feasible.

## 2015-04-01 NOTE — Telephone Encounter (Signed)
Will forward to Dr. Koneswaran  

## 2015-04-15 DIAGNOSIS — Z96652 Presence of left artificial knee joint: Secondary | ICD-10-CM | POA: Diagnosis not present

## 2015-04-15 DIAGNOSIS — Z96653 Presence of artificial knee joint, bilateral: Secondary | ICD-10-CM | POA: Diagnosis not present

## 2015-04-15 DIAGNOSIS — Z471 Aftercare following joint replacement surgery: Secondary | ICD-10-CM | POA: Diagnosis not present

## 2015-04-15 DIAGNOSIS — Z96651 Presence of right artificial knee joint: Secondary | ICD-10-CM | POA: Diagnosis not present

## 2015-04-22 DIAGNOSIS — M25432 Effusion, left wrist: Secondary | ICD-10-CM | POA: Diagnosis not present

## 2015-04-22 DIAGNOSIS — M545 Low back pain: Secondary | ICD-10-CM | POA: Diagnosis not present

## 2015-04-22 DIAGNOSIS — R760 Raised antibody titer: Secondary | ICD-10-CM | POA: Diagnosis not present

## 2015-04-22 DIAGNOSIS — M25431 Effusion, right wrist: Secondary | ICD-10-CM | POA: Diagnosis not present

## 2015-04-22 DIAGNOSIS — M19042 Primary osteoarthritis, left hand: Secondary | ICD-10-CM | POA: Diagnosis not present

## 2015-04-22 DIAGNOSIS — M19041 Primary osteoarthritis, right hand: Secondary | ICD-10-CM | POA: Diagnosis not present

## 2015-04-26 ENCOUNTER — Encounter (INDEPENDENT_AMBULATORY_CARE_PROVIDER_SITE_OTHER): Payer: Self-pay | Admitting: Internal Medicine

## 2015-05-03 ENCOUNTER — Other Ambulatory Visit (INDEPENDENT_AMBULATORY_CARE_PROVIDER_SITE_OTHER): Payer: Self-pay | Admitting: Internal Medicine

## 2015-05-04 ENCOUNTER — Ambulatory Visit (INDEPENDENT_AMBULATORY_CARE_PROVIDER_SITE_OTHER): Payer: Medicare Other | Admitting: Adult Health

## 2015-05-04 ENCOUNTER — Encounter: Payer: Self-pay | Admitting: Adult Health

## 2015-05-04 VITALS — BP 116/74 | HR 60 | Ht 63.0 in | Wt 196.0 lb

## 2015-05-04 DIAGNOSIS — Z79899 Other long term (current) drug therapy: Secondary | ICD-10-CM

## 2015-05-04 DIAGNOSIS — I480 Paroxysmal atrial fibrillation: Secondary | ICD-10-CM

## 2015-05-04 DIAGNOSIS — R001 Bradycardia, unspecified: Secondary | ICD-10-CM

## 2015-05-04 DIAGNOSIS — I498 Other specified cardiac arrhythmias: Secondary | ICD-10-CM | POA: Diagnosis not present

## 2015-05-04 NOTE — Progress Notes (Signed)
Cardiology Office Note   Date:  05/04/2015   ID:  MAILANI STORMES, DOB 1940-05-19, MRN [id number]  PCP:  Wende Neighbors, MD  Cardiologist: [name], NP   Chief Complaint  Patient presents with  . Atrial Fibrillation      History of Present Illness: [name] is a 75 y.o. female who presents for ongoing assessment and management of paroxysmal atrial fibrillation, history of grade 1 diastolic dysfunction, with preserved LV function,hypertension, mixed hyperlipidemia, with history of GERD, and obesity.    Today with complaints of atrial fibrillation.  The patient is retired Therapist, sports, she was sitting up reading late, at around 2 AM she turned off the light turned her side and her heart began to race.she states she got out of her stethoscope and counted her heart rate as over 100 beats per minute and irregular.  She normally takes propanolol 20 mg twice a day, but took an extra propanolol, due to racing heart rate.  She waited about an hour and her heart rate remained elevated, although it did become slightly lower, but because it was still tachycardic.  She took an additional propanolol.  About an hour later, her heart rate normalized.  She states he went back into normal rhythm.  The patient states, that she has had no further episodes, but was concerned enough to be checked out.  She denied any chest pain, dyspnea, nausea, or diaphoresis associated with this.  She has gone back to taking her propanolol 20 mg twice a day as directed.  She continues on ELIQUIS, without any evidence of bleeding or bruising.  She has not had any lab work completed since November of 2016.  Past Medical History  Diagnosis Date  . Essential hypertension   . Mixed hyperlipidemia   . Obesity   . GERD (gastroesophageal reflux disease)   . Hypothyroidism   . Migraines   . Osteoarthritis of knee   . IBS (irritable colon syndrome)   . Ocular myasthenia gravis ([place])   . Complication of anesthesia     Low  blood pressure, heart rate, O2 sat  . PAF (paroxysmal atrial fibrillation) (H[facility])     chads2vasc score is at least 4  . History of pneumonia   . Skin cancer   . Anemia   . Diverticulosis   . Chronic back pain   . Chronic nausea   . Paraganglioma ([place])     Resection 02/2012 (retroperitoneal)  . Depression with anxiety   . LVH (left ventricular hypertrophy)     Past Surgical History  Procedure Laterality Date  . Knee surgery    . Total knee arthroplasty Bilateral     2005 and 2011  . Abdominal hysterectomy    . Cholecystectomy    . Nasal sinus surgery      30 yrs ago  . Colonoscopy  08/24/2011    Procedure: COLONOSCOPY;  Surgeon: Rogene Houston, MD;  [facility];  Service: Endoscopy;  Laterality: N/A;  1200  . Ct guided biopsy  01/16/12  . Givens capsule study  01/29/2012    Procedure: GIVENS CAPSULE STUDY;  Surgeon: Rogene Houston, MD;  Location[facility];  Service: Endoscopy;  Laterality: N/A;  730  . Laparotomy  03/01/2012    Procedure: EXPLORATORY LAPAROTOMY;  Surgeon: Earnstine Regal, MD;  Location: [facility];  Service: General;  Laterality: N/A;  Exploratory Laparotomy ,Resection Retroperitoneal Mass  . Cardiac catheterization    . Abdominal hysterectomy  Current Outpatient Prescriptions  Medication Sig Dispense Refill  . ALPRAZolam (XANAX) 1 MG tablet Take 1 mg by mouth at bedtime as needed for sleep. can be repeated x 1 if needed. insomnia    . amitriptyline (ELAVIL) 25 MG tablet Take 25 mg by mouth at bedtime.    [place] Kitchen apixaban (ELIQUIS) 5 MG TABS tablet Take 1 tablet (5 mg total) by mouth 2 (two) times daily. 60 tablet 11  . Calcium Carbonate-Vit D-Min (CALTRATE 600+D PLUS MINERALS PO) Take 1 tablet by mouth 2 (two) times daily.     . Coenzyme Q10 (CO Q 10) 10 MG CAPS Take 10 mg by mouth every evening.     . Cyanocobalamin (B-12) 3000 MCG SUBL Place 1 tablet under the tongue every evening.    . diclofenac sodium (VOLTAREN) 1 % GEL Apply 4 g topically 4  (four) times daily as needed (for pain).     [place] docusate sodium (COLACE) 100 MG capsule Take 100 mg by mouth every evening.     . ferrous sulfate 325 (65 FE) MG EC tablet Take 325 mg by mouth every other day. **takes in the evening    . Glucosamine 500 MG CAPS Take 500 mg by mouth every evening.     [place] HYDROcodone-acetaminophen (NORCO) 10-325 MG per tablet Take 1 tablet by mouth every 6 (six) hours as needed (pain).     [place] Kitchen lansoprazole (PREVACID) 30 MG capsule TAKE (1) CAPSULE BY MOUTH TWICE DAILY. 60 capsule 0  . levothyroxine (SYNTHROID, LEVOTHROID) 100 MCG tablet Take 100 mcg by mouth daily before breakfast.     . lidocaine (LIDODERM) 5 % Place 1-3 patches onto the skin daily as needed (for pain). Remove & Discard patch within 12 hours or as directed by MD for pain    . lisinopril (PRINIVIL,ZESTRIL) 5 MG tablet Take 5 mg by mouth daily.     . Magnesium 250 MG TABS Take 250 mg by mouth every evening.     . methocarbamol (ROBAXIN) 500 MG tablet Take 500 mg by mouth daily as needed. Muscle spasms    . Multiple Vitamin (MULTIVITAMIN WITH MINERALS) TABS Take 1 tablet by mouth every evening.     . NON FORMULARY Apply 1 application topically 2 (two) times daily. Nail clear from dermatologist    . Omega-3 Fatty Acids (FISH OIL) 1200 MG CAPS Take 1,200 mg by mouth every evening.     . pravastatin (PRAVACHOL) 80 MG tablet Take 80 mg by mouth every evening.     . Probiotic Product (ALIGN PO) Take 1 capsule by mouth every evening.     . propranolol (INDERAL) 40 MG tablet Take 20 mg by mouth 2 (two) times daily.    [place] Kitchen pyridostigmine (MESTINON) 60 MG tablet Take 30 mg by mouth 3 (three) times daily. Take 60 mg by mouth in the AM, take 30 mg by mouth midday and take 30 mg by mouth at bedtime    . SUMAtriptan (IMITREX) 50 MG tablet Take 50 mg by mouth every 2 (two) hours as needed. For migraines     No current facility-administered medications for this visit.    Allergies:   Tylox; Avocado; Doxycycline  monohydrate; Macrodantin; Other; Tetracyclines & related; Ampicillin; Biaxin; and Codeine    Social History:  The patient  reports that she has never smoked. She has never used smokeless tobacco. She reports that she does not drink alcohol or use illicit drugs.   Family History:  The patient's family history includes  Inflammatory bowel disease in her maternal grandmother.    ROS: All other systems are reviewed and negative. Unless otherwise mentioned in H&P    PHYSICAL EXAM: VS:  BP 116/74 mmHg  Pulse 60  Ht 5\' 3"  (1.6 m)  Wt 196 lb (88.905 kg)  BMI 34.73 kg/m2  SpO2 94% , BMI Body mass index is 34.73 kg/(m^2). GEN: Well nourished, well developed, in no acute distress HEENT: normal Neck: no JVD, carotid bruits, or masses Cardiac: RRR; bradycardic, soft systolic murmurs, rubs, or gallops,no edema  Respiratory:  clear to auscultation bilaterally, normal work of breathing GI: soft, nontender, nondistended, + BS MS: no deformity or atrophy Skin: warm and dry, no rash Neuro:  Strength and sensation are intact Psych: euthymic mood, full affect   EKG:  The ekg ordered today demonstrates sinus bradycardia, left anterior fascicular block, rate of 52 beats per minute.   Recent Labs: 02/09/2015: ALT 13*; BUN 15; Creatinine, Ser 0.96; Hemoglobin 13.6; Platelets 114*; Potassium 4.0; Sodium 138    Lipid Panel No results found for: CHOL, TRIG, HDL, CHOLHDL, VLDL, LDLCALC, LDLDIRECT    Wt Readings from Last 3 Encounters:  05/04/15 196 lb (88.905 kg)  02/09/15 193 lb (87.544 kg)  12/18/14 193 lb (87.544 kg)      Other studies Reviewed: Additional studies/ records that were reviewed today include: echocardiogram, nuclear medicine stress test, 2016. Review of the above records demonstrates: normal stress test, echocardiogram, with grade 1 diastolic dysfunction, and normal LVEF.   ASSESSMENT AND PLAN:  1. Paroxysmal atrial fibrillation: the patient as she normally any rate control.   Normal sinus rhythm on propanolol 20 mg by mouth twice a day.  She had one episode early Saturday morning, around 2 AM after sitting up reading.  She took 2 extra doses of propanolol, approximately one hour apart data resting heart rate.  Repeat EKG reveals normal sinus rhythm, sinus bradycardia, on propanolol 20 mg twice a day.  I will check a CBC, BMET, and magnesium to evaluate her current electrolyte and status for anemia.  She denies obstructive sleep apnea, excessive caffeine use, excessive stress, or infection.  Will not check a new stress test or echocardiogram at this time as both were essentially normal on last check, less than 6 months ago. I have given her copies of all these tests.  She will follow-up with Dr. [name] in approximately one month.  Further discussion will be had concerning any new testing or medication changes.  2. Hypertension: The patient's blood pressure is well-controlled.  I will continue her on lisinopril, will not make any changes in her medication regimen at this time.  3. History of anemia: she is on iron replacement.  CBC will be checked as discussed as she is also on double anticoagulation therapy with ELIQUIS 5 mg twice a day.  Current medicines are reviewed at length with the patient today.    Labs/ tests ordered today include: CBC, EKG, BMET, magnesium, uric acid. Orders Placed This Encounter  Procedures  . Uric acid  . Basic Metabolic Panel (BMET)  . CBC with Differential  . Magnesium  . EKG 12-Lead     Disposition:   FU with 1 month with Dr. [name]. Signed, Jory Sims, NP  05/04/2015 3:43 PM    [facility] [address] [place], [place] [address] Phone: [phone]; Fax: [phone]

## 2015-05-04 NOTE — Patient Instructions (Signed)
Your physician recommends that you schedule a follow-up appointment in: 1 Month with Dr. [name]  Your physician recommends that you continue on your current medications as directed. Please refer to the Current Medication list given to you today.  Your physician recommends that you have lab work done today.   If you need a refill on your cardiac medications before your next appointment, please call your pharmacy.  Thank you for choosing [facility]!

## 2015-05-04 NOTE — Progress Notes (Deleted)
Name: [name]    DOB: February 03, 1941  Age: 75 y.o.  MR#: [id number]       PCP:  Wende Neighbors, MD      Insurance: Payor: MEDICARE / Plan: MEDICARE PART A AND B / Product Type: *No Product type* /   CC:   No chief complaint on file.   VS Filed Vitals:   05/04/15 1456  BP: 116/74  Pulse: 60  Height: 5\' 3"  (1.6 m)  Weight: 196 lb (88.905 kg)  SpO2: 94%    Weights Current Weight  05/04/15 196 lb (88.905 kg)  02/09/15 193 lb (87.544 kg)  12/18/14 193 lb (87.544 kg)    Blood Pressure  BP Readings from Last 3 Encounters:  05/04/15 116/74  02/09/15 134/58  12/18/14 116/78     Admit date:  (Not on file) Last encounter with RMR:  Visit date not found   Allergy Tylox; Avocado; Doxycycline monohydrate; Macrodantin; Other; Tetracyclines & related; Ampicillin; Biaxin; and Codeine  Current Outpatient Prescriptions  Medication Sig Dispense Refill  . ALPRAZolam (XANAX) 1 MG tablet Take 1 mg by mouth at bedtime as needed for sleep. can be repeated x 1 if needed. insomnia    . amitriptyline (ELAVIL) 25 MG tablet Take 25 mg by mouth at bedtime.    Marland Kitchen apixaban (ELIQUIS) 5 MG TABS tablet Take 1 tablet (5 mg total) by mouth 2 (two) times daily. 60 tablet 11  . Calcium Carbonate-Vit D-Min (CALTRATE 600+D PLUS MINERALS PO) Take 1 tablet by mouth 2 (two) times daily.     . Coenzyme Q10 (CO Q 10) 10 MG CAPS Take 10 mg by mouth every evening.     . Cyanocobalamin (B-12) 3000 MCG SUBL Place 1 tablet under the tongue every evening.    . diclofenac sodium (VOLTAREN) 1 % GEL Apply 4 g topically 4 (four) times daily as needed (for pain).     Marland Kitchen docusate sodium (COLACE) 100 MG capsule Take 100 mg by mouth every evening.     . ferrous sulfate 325 (65 FE) MG EC tablet Take 325 mg by mouth every other day. **takes in the evening    . Glucosamine 500 MG CAPS Take 500 mg by mouth every evening.     Marland Kitchen HYDROcodone-acetaminophen (NORCO) 10-325 MG per tablet Take 1 tablet by mouth every 6 (six) hours as needed (pain).      Marland Kitchen lansoprazole (PREVACID) 30 MG capsule TAKE (1) CAPSULE BY MOUTH TWICE DAILY. 60 capsule 0  . levothyroxine (SYNTHROID, LEVOTHROID) 100 MCG tablet Take 100 mcg by mouth daily before breakfast.     . lidocaine (LIDODERM) 5 % Place 1-3 patches onto the skin daily as needed (for pain). Remove & Discard patch within 12 hours or as directed by MD for pain    . lisinopril (PRINIVIL,ZESTRIL) 5 MG tablet Take 5 mg by mouth daily.     . Magnesium 250 MG TABS Take 250 mg by mouth every evening.     . methocarbamol (ROBAXIN) 500 MG tablet Take 500 mg by mouth daily as needed. Muscle spasms    . Multiple Vitamin (MULTIVITAMIN WITH MINERALS) TABS Take 1 tablet by mouth every evening.     . NON FORMULARY Apply 1 application topically 2 (two) times daily. Nail clear from dermatologist    . Omega-3 Fatty Acids (FISH OIL) 1200 MG CAPS Take 1,200 mg by mouth every evening.     . pravastatin (PRAVACHOL) 80 MG tablet Take 80 mg by mouth every evening.     Marland Kitchen  Probiotic Product (ALIGN PO) Take 1 capsule by mouth every evening.     . propranolol (INDERAL) 40 MG tablet Take 20 mg by mouth 2 (two) times daily.    Marland Kitchen pyridostigmine (MESTINON) 60 MG tablet Take 30 mg by mouth 3 (three) times daily. Take 60 mg by mouth in the AM, take 30 mg by mouth midday and take 30 mg by mouth at bedtime    . SUMAtriptan (IMITREX) 50 MG tablet Take 50 mg by mouth every 2 (two) hours as needed. For migraines     No current facility-administered medications for this visit.    Discontinued Meds:   There are no discontinued medications.  Patient Active Problem List   Diagnosis Date Noted  . Hypertensive cardiovascular disease 05/25/2014  . Malnutrition of moderate degree ([place]) 04/28/2014  . Thrombocytopenia ([place]) 04/28/2014  . UTI (urinary tract infection) 04/28/2014  . Physical deconditioning 04/27/2014  . Ocular myasthenia ([place]) 04/27/2014  . Depression with anxiety 04/27/2014  . Chronic diastolic CHF (congestive heart  failure) ([place]) 04/27/2014  . Bradycardia   . Orthostatic hypotension   . AKI (acute kidney injury) ([place]) 04/05/2014  . Palpitations 04/04/2014  . Paroxysmal atrial fibrillation ([place]) 04/04/2014  . Chest pain 04/01/2014  . Sinus bradycardia 04/01/2014  . Weight loss 06/10/2012  . Neoplasm of uncertain behavior of retroperitoneum, paraganglioma 02/19/2012  . Abdominal pain, bilateral lower quadrant 09/26/2011  . Hypertension 07/26/2011  . Hypothyroidism 07/26/2011  . Arthritis 07/26/2011  . High cholesterol 07/26/2011  . GERD (gastroesophageal reflux disease) 07/26/2011  . Rectal bleeding 07/26/2011  . IBS (irritable bowel syndrome) 07/26/2011    LABS    Component Value Date/Time   NA 138 02/09/2015 1755   NA 142 04/28/2014 0652   NA 141 04/27/2014 1253   K 4.0 02/09/2015 1755   K 4.0 04/28/2014 0652   K 3.9 04/27/2014 1253   CL 102 02/09/2015 1755   CL 113* 04/28/2014 0652   CL 110 04/27/2014 1253   CO2 29 02/09/2015 1755   CO2 26 04/28/2014 0652   CO2 23 04/27/2014 1253   GLUCOSE 86 02/09/2015 1755   GLUCOSE 94 04/28/2014 0652   GLUCOSE 95 04/27/2014 1253   BUN 15 02/09/2015 1755   BUN 10 04/28/2014 0652   BUN 12 04/27/2014 1253   CREATININE 0.96 02/09/2015 1755   CREATININE 0.96 04/28/2014 0652   CREATININE 0.95 04/27/2014 1253   CREATININE 1.18* 09/26/2011 1525   CALCIUM 9.2 02/09/2015 1755   CALCIUM 8.8 04/28/2014 0652   CALCIUM 10.0 04/27/2014 1253   GFRNONAA 56* 02/09/2015 1755   GFRNONAA 56* 04/28/2014 0652   GFRNONAA 57* 04/27/2014 1253   GFRAA >60 02/09/2015 1755   GFRAA 65* 04/28/2014 0652   GFRAA 66* 04/27/2014 1253   CMP     Component Value Date/Time   NA 138 02/09/2015 1755   K 4.0 02/09/2015 1755   CL 102 02/09/2015 1755   CO2 29 02/09/2015 1755   GLUCOSE 86 02/09/2015 1755   BUN 15 02/09/2015 1755   CREATININE 0.96 02/09/2015 1755   CREATININE 1.18* 09/26/2011 1525   CALCIUM 9.2 02/09/2015 1755   PROT 6.8 02/09/2015 1755   ALBUMIN 3.5  02/09/2015 1755   AST 19 02/09/2015 1755   ALT 13* 02/09/2015 1755   ALKPHOS 65 02/09/2015 1755   BILITOT 0.5 02/09/2015 1755   GFRNONAA 56* 02/09/2015 1755   GFRAA >60 02/09/2015 1755       Component Value Date/Time   WBC 8.0 02/09/2015 1755  WBC 3.9* 04/28/2014 0652   WBC 5.1 04/27/2014 1253   HGB 13.6 02/09/2015 1755   HGB 11.5* 04/28/2014 0652   HGB 12.6 04/27/2014 1253   HCT 40.0 02/09/2015 1755   HCT 34.8* 04/28/2014 0652   HCT 38.0 04/27/2014 1253   MCV 99.5 02/09/2015 1755   MCV 99.4 04/28/2014 0652   MCV 98.2 04/27/2014 1253    Lipid Panel  No results found for: CHOL, TRIG, HDL, CHOLHDL, VLDL, LDLCALC, LDLDIRECT  ABG No results found for: PHART, PCO2ART, PO2ART, HCO3, TCO2, ACIDBASEDEF, O2SAT   Lab Results  Component Value Date   TSH 0.090* 04/28/2014   BNP (last 3 results) No results for input(s): BNP in the last 8760 hours.  ProBNP (last 3 results) No results for input(s): PROBNP in the last 8760 hours.  Cardiac Panel (last 3 results) No results for input(s): CKTOTAL, CKMB, TROPONINI, RELINDX in the last 72 hours.  Iron/TIBC/Ferritin/ %Sat No results found for: IRON, TIBC, FERRITIN, IRONPCTSAT   EKG Orders placed or performed in visit on 06/16/14  . Cardiac event monitor     Prior Assessment and Plan Problem List as of 05/04/2015      Cardiovascular and Mediastinum   Hypertension   Last Assessment & Plan 04/01/2014 Office Visit Written 04/01/2014  3:03 PM by Imogene Burn, PA-C    Blood pressures been up and down. She is worried that this could be an indication of a recurrence of her paraganglioma. She takes Inderal for migraines. She hasn't had hypertension for many years. She used to take lisinopril 40 mg daily but was able stop it once she lost 80 pounds. I discussed this patient in detail with Dr.Koneswaran. We will start her on lisinopril 10 mg once daily. Decrease Inderal to 40 mg twice a day cousin of bradycardia and fatigue. Refer to oncology  for further workup of paraganglioma. Trying to obtain results of her lab work on Saturday.      Sinus bradycardia   Last Assessment & Plan 04/01/2014 Office Visit Written 04/01/2014  3:11 PM by Imogene Burn, PA-C    Patient's heart rate is 40 bpm since increasing her Inderal to 3 times a day. Decrease Inderal to 40 mg twice a day. Patient and daughter will monitor her heart rate at home. She may need to decrease Inderal further. She says this is a miracle drug for her migraines and is reluctant to stop.      Paroxysmal atrial fibrillation (HCC)   Chronic diastolic CHF (congestive heart failure) (HCC)   Orthostatic hypotension   Hypertensive cardiovascular disease     Digestive   GERD (gastroesophageal reflux disease)   Rectal bleeding   IBS (irritable bowel syndrome)     Endocrine   Hypothyroidism     Musculoskeletal and Integument   Arthritis   Ocular myasthenia ([place])     Genitourinary   AKI (acute kidney injury) ([place])   UTI (urinary tract infection)     Other   High cholesterol   Abdominal pain, bilateral lower quadrant   Neoplasm of uncertain behavior of retroperitoneum, paraganglioma   Last Assessment & Plan 04/09/2014 Office Visit Written 04/05/2014  9:46 PM by Baird Cancer, PA-C    Paraganglioma of the retroperitoneum, 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  Secondary to clinical issues, I will set her up for repeat staging scans with CT CAP with contrast.  If negative work-up will consider seeing the patient annually per NCCN guidelines.  Return  in 2-3 weeks following CT imaging.      Weight loss   Chest pain   Last Assessment & Plan 04/01/2014 Office Visit Written 04/01/2014  3:04 PM by Imogene Burn, PA-C    Patient has had a history of chest pain. Had nonobstructive CAD on cath approximately 15 years ago. We'll proceed with Lexi scan Myoview.      Palpitations   Physical deconditioning   Depression with anxiety   Bradycardia   Malnutrition of  moderate degree (HCC)   Thrombocytopenia (HCC)       Imaging: No results found.

## 2015-05-05 LAB — BASIC METABOLIC PANEL
BUN: 17 mg/dL (ref 7–25)
CHLORIDE: 103 mmol/L (ref 98–110)
CO2: 24 mmol/L (ref 20–31)
Calcium: 9.6 mg/dL (ref 8.6–10.4)
Creat: 1.05 mg/dL — ABNORMAL HIGH (ref 0.60–0.93)
GLUCOSE: 99 mg/dL (ref 65–99)
Potassium: 4.2 mmol/L (ref 3.5–5.3)
Sodium: 137 mmol/L (ref 135–146)

## 2015-05-05 LAB — CBC WITH DIFFERENTIAL/PLATELET
BASOS ABS: 0.1 10*3/uL (ref 0.0–0.1)
Basophils Relative: 1 % (ref 0–1)
EOS ABS: 0.1 10*3/uL (ref 0.0–0.7)
EOS PCT: 1 % (ref 0–5)
HEMATOCRIT: 46 % (ref 36.0–46.0)
Hemoglobin: 14.9 g/dL (ref 12.0–15.0)
LYMPHS ABS: 2.2 10*3/uL (ref 0.7–4.0)
LYMPHS PCT: 25 % (ref 12–46)
MCH: 32.4 pg (ref 26.0–34.0)
MCHC: 32.4 g/dL (ref 30.0–36.0)
MCV: 100 fL (ref 78.0–100.0)
MPV: 10.1 fL (ref 8.6–12.4)
Monocytes Absolute: 0.7 10*3/uL (ref 0.1–1.0)
Monocytes Relative: 8 % (ref 3–12)
NEUTROS PCT: 65 % (ref 43–77)
Neutro Abs: 5.8 10*3/uL (ref 1.7–7.7)
PLATELETS: 225 10*3/uL (ref 150–400)
RBC: 4.6 MIL/uL (ref 3.87–5.11)
RDW: 13.2 % (ref 11.5–15.5)
WBC: 8.9 10*3/uL (ref 4.0–10.5)

## 2015-05-05 LAB — MAGNESIUM: Magnesium: 2 mg/dL (ref 1.5–2.5)

## 2015-05-05 LAB — URIC ACID: Uric Acid, Serum: 5.2 mg/dL (ref 2.4–7.0)

## 2015-05-11 DIAGNOSIS — D696 Thrombocytopenia, unspecified: Secondary | ICD-10-CM | POA: Diagnosis not present

## 2015-05-11 DIAGNOSIS — R718 Other abnormality of red blood cells: Secondary | ICD-10-CM | POA: Diagnosis not present

## 2015-05-11 DIAGNOSIS — C755 Malignant neoplasm of aortic body and other paraganglia: Secondary | ICD-10-CM | POA: Diagnosis not present

## 2015-05-17 DIAGNOSIS — E039 Hypothyroidism, unspecified: Secondary | ICD-10-CM | POA: Diagnosis not present

## 2015-05-17 DIAGNOSIS — I10 Essential (primary) hypertension: Secondary | ICD-10-CM | POA: Diagnosis not present

## 2015-05-19 DIAGNOSIS — N182 Chronic kidney disease, stage 2 (mild): Secondary | ICD-10-CM | POA: Diagnosis not present

## 2015-05-19 DIAGNOSIS — I480 Paroxysmal atrial fibrillation: Secondary | ICD-10-CM | POA: Diagnosis not present

## 2015-05-19 DIAGNOSIS — E039 Hypothyroidism, unspecified: Secondary | ICD-10-CM | POA: Diagnosis not present

## 2015-05-19 DIAGNOSIS — F5101 Primary insomnia: Secondary | ICD-10-CM | POA: Diagnosis not present

## 2015-05-19 DIAGNOSIS — Z9181 History of falling: Secondary | ICD-10-CM | POA: Diagnosis not present

## 2015-05-19 DIAGNOSIS — I10 Essential (primary) hypertension: Secondary | ICD-10-CM | POA: Diagnosis not present

## 2015-05-19 DIAGNOSIS — E782 Mixed hyperlipidemia: Secondary | ICD-10-CM | POA: Diagnosis not present

## 2015-05-19 DIAGNOSIS — G894 Chronic pain syndrome: Secondary | ICD-10-CM | POA: Diagnosis not present

## 2015-05-26 DIAGNOSIS — R531 Weakness: Secondary | ICD-10-CM | POA: Diagnosis not present

## 2015-05-26 DIAGNOSIS — R296 Repeated falls: Secondary | ICD-10-CM | POA: Diagnosis not present

## 2015-05-28 DIAGNOSIS — R531 Weakness: Secondary | ICD-10-CM | POA: Diagnosis not present

## 2015-05-28 DIAGNOSIS — R296 Repeated falls: Secondary | ICD-10-CM | POA: Diagnosis not present

## 2015-06-01 DIAGNOSIS — R296 Repeated falls: Secondary | ICD-10-CM | POA: Diagnosis not present

## 2015-06-01 DIAGNOSIS — R531 Weakness: Secondary | ICD-10-CM | POA: Diagnosis not present

## 2015-06-03 DIAGNOSIS — R531 Weakness: Secondary | ICD-10-CM | POA: Diagnosis not present

## 2015-06-03 DIAGNOSIS — R296 Repeated falls: Secondary | ICD-10-CM | POA: Diagnosis not present

## 2015-06-08 DIAGNOSIS — R296 Repeated falls: Secondary | ICD-10-CM | POA: Diagnosis not present

## 2015-06-08 DIAGNOSIS — R531 Weakness: Secondary | ICD-10-CM | POA: Diagnosis not present

## 2015-06-09 ENCOUNTER — Other Ambulatory Visit (INDEPENDENT_AMBULATORY_CARE_PROVIDER_SITE_OTHER): Payer: Self-pay | Admitting: Internal Medicine

## 2015-06-10 DIAGNOSIS — R296 Repeated falls: Secondary | ICD-10-CM | POA: Diagnosis not present

## 2015-06-10 DIAGNOSIS — R531 Weakness: Secondary | ICD-10-CM | POA: Diagnosis not present

## 2015-06-15 ENCOUNTER — Ambulatory Visit (INDEPENDENT_AMBULATORY_CARE_PROVIDER_SITE_OTHER): Payer: Medicare Other | Admitting: Cardiovascular Disease

## 2015-06-15 VITALS — BP 110/64 | HR 50 | Wt 200.0 lb

## 2015-06-15 DIAGNOSIS — R001 Bradycardia, unspecified: Secondary | ICD-10-CM | POA: Diagnosis not present

## 2015-06-15 DIAGNOSIS — I119 Hypertensive heart disease without heart failure: Secondary | ICD-10-CM

## 2015-06-15 DIAGNOSIS — R0602 Shortness of breath: Secondary | ICD-10-CM

## 2015-06-15 DIAGNOSIS — Z79899 Other long term (current) drug therapy: Secondary | ICD-10-CM | POA: Diagnosis not present

## 2015-06-15 DIAGNOSIS — I480 Paroxysmal atrial fibrillation: Secondary | ICD-10-CM

## 2015-06-15 DIAGNOSIS — I10 Essential (primary) hypertension: Secondary | ICD-10-CM

## 2015-06-15 DIAGNOSIS — E785 Hyperlipidemia, unspecified: Secondary | ICD-10-CM

## 2015-06-15 MED ORDER — PROPRANOLOL HCL 20 MG PO TABS: 20.0000 mg | ORAL_TABLET | Freq: Two times a day (BID) | ORAL | Status: DC

## 2015-06-15 MED ORDER — APIXABAN 5 MG PO TABS: 5.0000 mg | ORAL_TABLET | Freq: Two times a day (BID) | ORAL | Status: DC

## 2015-06-15 NOTE — Progress Notes (Signed)
Patient ID: [name], female   DOB: 1940/09/24, 75 y.o.   MRN: [id number]      SUBJECTIVE: The patient returns for follow up of paroxysmal atrial fibrillation. When I evaluated her in 11/2014, she was doing well. She saw K. Lawrence NP on 05/04/15 for palpitations and labs were checked, which were normal including BMET, CBC, and magnesium. She took propranolol on her own. She was evaluated by EP (Dr. Rayann Heman) in March 2016 who did not recommend antiarrhythmic drug therapy given her isolated instance of atrial fibrillation. Has some exertional dyspnea which she blames on cardiovascular deconditioning. Has had some falls and walks with a cane and a walker at times. Denies bleeding problems.   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   Review of Systems: As per "subjective", otherwise negative.  Allergies  Allergen Reactions  . Tylox [Oxycodone-Acetaminophen]     Severe nausea  . Avocado Diarrhea and Nausea And Vomiting  . Doxycycline Monohydrate Nausea And Vomiting  . Macrodantin [Nitrofurantoin Macrocrystal] Nausea And Vomiting  . Other Rash  . Tetracyclines & Related Nausea And Vomiting  . Ampicillin Rash  . Biaxin [Clarithromycin] Rash  . Codeine Itching    Can not take Codeine unless in cough syrup    Current Outpatient Prescriptions  Medication Sig Dispense Refill  . ALPRAZolam (XANAX) 1 MG tablet Take 1 mg by mouth at bedtime as needed for sleep. can be repeated x 1 if needed. insomnia    . amitriptyline (ELAVIL) 25 MG tablet Take 25 mg by mouth at bedtime.    Marland Kitchen apixaban (ELIQUIS) 5 MG TABS tablet Take 1 tablet (5 mg total) by mouth 2 (two) times daily. 60 tablet 11  . Calcium Carbonate-Vit D-Min (CALTRATE 600+D PLUS MINERALS PO) Take 1 tablet by mouth 2 (two) times daily.     . Coenzyme Q10 (CO Q 10) 10 MG CAPS Take 10 mg by mouth every evening.     . Cyanocobalamin (B-12) 3000 MCG SUBL Place 1 tablet under the tongue every  evening.    . diclofenac sodium (VOLTAREN) 1 % GEL Apply 4 g topically 4 (four) times daily as needed (for pain).     Marland Kitchen docusate sodium (COLACE) 100 MG capsule Take 100 mg by mouth every evening.     . ferrous sulfate 325 (65 FE) MG EC tablet Take 325 mg by mouth every other day. **takes in the evening    . Glucosamine 500 MG CAPS Take 500 mg by mouth every evening.     Marland Kitchen HYDROcodone-acetaminophen (NORCO) 10-325 MG per tablet Take 1 tablet by mouth every 6 (six) hours as needed (pain).     Marland Kitchen lansoprazole (PREVACID) 30 MG capsule TAKE (1) CAPSULE BY MOUTH TWICE DAILY. 60 capsule 0  . levothyroxine (SYNTHROID, LEVOTHROID) 100 MCG tablet Take 100 mcg by mouth daily before breakfast.     . lidocaine (LIDODERM) 5 % Place 1-3 patches onto the skin daily as needed (for pain). Remove & Discard patch within 12 hours or as directed by MD for pain    . lisinopril (PRINIVIL,ZESTRIL) 5 MG tablet Take 5 mg by mouth daily.     . Magnesium 250 MG TABS Take 250 mg by mouth every evening.     . methocarbamol (ROBAXIN) 500 MG tablet Take 500 mg by mouth daily as needed. Muscle spasms    . Multiple Vitamin (MULTIVITAMIN WITH MINERALS) TABS Take 1 tablet by mouth every evening.     Marland Kitchen  NON FORMULARY Apply 1 application topically 2 (two) times daily. Nail clear from dermatologist    . Omega-3 Fatty Acids (FISH OIL) 1200 MG CAPS Take 1,200 mg by mouth every evening.     . pravastatin (PRAVACHOL) 80 MG tablet Take 80 mg by mouth every evening.     . Probiotic Product (ALIGN PO) Take 1 capsule by mouth every evening.     . propranolol (INDERAL) 40 MG tablet Take 20 mg by mouth 2 (two) times daily.    Marland Kitchen pyridostigmine (MESTINON) 60 MG tablet Take 30 mg by mouth 3 (three) times daily. Take 60 mg by mouth in the AM, take 30 mg by mouth midday and take 30 mg by mouth at bedtime    . SUMAtriptan (IMITREX) 50 MG tablet Take 50 mg by mouth every 2 (two) hours as needed. For migraines     No current facility-administered  medications for this visit.    Past Medical History  Diagnosis Date  . Essential hypertension   . Mixed hyperlipidemia   . Obesity   . GERD (gastroesophageal reflux disease)   . Hypothyroidism   . Migraines   . Osteoarthritis of knee   . IBS (irritable colon syndrome)   . Ocular myasthenia gravis ([place])   . Complication of anesthesia     Low blood pressure, heart rate, O2 sat  . PAF (paroxysmal atrial fibrillation) (HCC)     chads2vasc score is at least 4  . History of pneumonia   . Skin cancer   . Anemia   . Diverticulosis   . Chronic back pain   . Chronic nausea   . Paraganglioma ([place])     Resection 02/2012 (retroperitoneal)  . Depression with anxiety   . LVH (left ventricular hypertrophy)     Past Surgical History  Procedure Laterality Date  . Knee surgery    . Total knee arthroplasty Bilateral     2005 and 2011  . Abdominal hysterectomy    . Cholecystectomy    . Nasal sinus surgery      30 yrs ago  . Colonoscopy  08/24/2011    Procedure: COLONOSCOPY;  Surgeon: Rogene Houston, MD;  [facility];  Service: Endoscopy;  Laterality: N/A;  1200  . Ct guided biopsy  01/16/12  . Givens capsule study  01/29/2012    Procedure: GIVENS CAPSULE STUDY;  Surgeon: Rogene Houston, MD;  Location: [facility];  Service: Endoscopy;  Laterality: N/A;  730  . Laparotomy  03/01/2012    Procedure: EXPLORATORY LAPAROTOMY;  Surgeon: Earnstine Regal, MD;  Location: [facility];  Service: General;  Laterality: N/A;  Exploratory Laparotomy ,Resection Retroperitoneal Mass  . Cardiac catheterization    . Abdominal hysterectomy      Social History   Social History  . Marital Status: Divorced    Spouse Name: N/A  . Number of Children: N/A  . Years of Education: N/A   Occupational History  . Not on file.   Social History Main Topics  . Smoking status: Never Smoker   . Smokeless tobacco: Never Used  . Alcohol Use: No  . Drug Use: No  . Sexual Activity: Not on file   Other  Topics Concern  . Not on file   Social History Narrative   Lives in [place] alone.  Retired Quarry manager.     Filed Vitals:   06/15/15 1304  BP: 110/64  Pulse: 50  Weight: 200 lb (90.719 kg)  PHYSICAL EXAM General: NAD HEENT: Normal. Neck: No JVD, no thyromegaly. Lungs: Clear to auscultation bilaterally with normal respiratory effort. CV: Nondisplaced PMI.  Bradycardic, regular rhythm, normal S1/S2, no S3/S4, no murmur.  Abdomen: Soft, nontender, no distention.  Neurologic: Alert and oriented.  Psych: Normal affect. Skin: Normal. Musculoskeletal: No gross deformities.  ECG: Most recent ECG reviewed.      ASSESSMENT AND PLAN: 1. Paroxysmal atrial fibrillation: Takes propranolol and Eliquis. No changes.  2. Essential HTN: Controlled. No changes.  3. Hyperlipidemia: Continue pravastatin.  4. SOB: Appears to be related to cardiovascular deconditioning.  Dispo: f/u 1 year.   Kate Sable, M.D., F.A.C.C.

## 2015-06-15 NOTE — Addendum Note (Signed)
Addended byDebbora Lacrosse R on: 06/15/2015 01:20 PM   Modules accepted: Orders

## 2015-06-15 NOTE — Patient Instructions (Signed)

## 2015-07-06 DIAGNOSIS — E039 Hypothyroidism, unspecified: Secondary | ICD-10-CM | POA: Diagnosis not present

## 2015-07-06 DIAGNOSIS — I10 Essential (primary) hypertension: Secondary | ICD-10-CM | POA: Diagnosis not present

## 2015-07-06 DIAGNOSIS — G7001 Myasthenia gravis with (acute) exacerbation: Secondary | ICD-10-CM | POA: Diagnosis not present

## 2015-07-06 DIAGNOSIS — E782 Mixed hyperlipidemia: Secondary | ICD-10-CM | POA: Diagnosis not present

## 2015-07-07 ENCOUNTER — Other Ambulatory Visit (INDEPENDENT_AMBULATORY_CARE_PROVIDER_SITE_OTHER): Payer: Self-pay | Admitting: Internal Medicine

## 2015-07-19 DIAGNOSIS — M542 Cervicalgia: Secondary | ICD-10-CM | POA: Diagnosis not present

## 2015-07-19 DIAGNOSIS — M545 Low back pain: Secondary | ICD-10-CM | POA: Diagnosis not present

## 2015-07-19 DIAGNOSIS — R262 Difficulty in walking, not elsewhere classified: Secondary | ICD-10-CM | POA: Diagnosis not present

## 2015-07-19 DIAGNOSIS — R2681 Unsteadiness on feet: Secondary | ICD-10-CM | POA: Diagnosis not present

## 2015-07-21 DIAGNOSIS — M542 Cervicalgia: Secondary | ICD-10-CM | POA: Diagnosis not present

## 2015-07-21 DIAGNOSIS — M545 Low back pain: Secondary | ICD-10-CM | POA: Diagnosis not present

## 2015-07-21 DIAGNOSIS — R2681 Unsteadiness on feet: Secondary | ICD-10-CM | POA: Diagnosis not present

## 2015-07-21 DIAGNOSIS — R262 Difficulty in walking, not elsewhere classified: Secondary | ICD-10-CM | POA: Diagnosis not present

## 2015-07-26 DIAGNOSIS — M542 Cervicalgia: Secondary | ICD-10-CM | POA: Diagnosis not present

## 2015-07-26 DIAGNOSIS — R262 Difficulty in walking, not elsewhere classified: Secondary | ICD-10-CM | POA: Diagnosis not present

## 2015-07-26 DIAGNOSIS — M545 Low back pain: Secondary | ICD-10-CM | POA: Diagnosis not present

## 2015-07-26 DIAGNOSIS — R2681 Unsteadiness on feet: Secondary | ICD-10-CM | POA: Diagnosis not present

## 2015-07-27 DIAGNOSIS — Z315 Encounter for genetic counseling: Secondary | ICD-10-CM | POA: Diagnosis not present

## 2015-07-27 DIAGNOSIS — Z803 Family history of malignant neoplasm of breast: Secondary | ICD-10-CM | POA: Diagnosis not present

## 2015-07-27 DIAGNOSIS — Z1379 Encounter for other screening for genetic and chromosomal anomalies: Secondary | ICD-10-CM | POA: Diagnosis not present

## 2015-07-27 DIAGNOSIS — Z801 Family history of malignant neoplasm of trachea, bronchus and lung: Secondary | ICD-10-CM | POA: Diagnosis not present

## 2015-07-27 DIAGNOSIS — C755 Malignant neoplasm of aortic body and other paraganglia: Secondary | ICD-10-CM | POA: Diagnosis not present

## 2015-07-27 DIAGNOSIS — Z800 Family history of malignant neoplasm of digestive organs: Secondary | ICD-10-CM | POA: Diagnosis not present

## 2015-07-30 DIAGNOSIS — R2681 Unsteadiness on feet: Secondary | ICD-10-CM | POA: Diagnosis not present

## 2015-07-30 DIAGNOSIS — M545 Low back pain: Secondary | ICD-10-CM | POA: Diagnosis not present

## 2015-07-30 DIAGNOSIS — M542 Cervicalgia: Secondary | ICD-10-CM | POA: Diagnosis not present

## 2015-07-30 DIAGNOSIS — R262 Difficulty in walking, not elsewhere classified: Secondary | ICD-10-CM | POA: Diagnosis not present

## 2015-08-02 DIAGNOSIS — M545 Low back pain: Secondary | ICD-10-CM | POA: Diagnosis not present

## 2015-08-02 DIAGNOSIS — R2681 Unsteadiness on feet: Secondary | ICD-10-CM | POA: Diagnosis not present

## 2015-08-02 DIAGNOSIS — M542 Cervicalgia: Secondary | ICD-10-CM | POA: Diagnosis not present

## 2015-08-02 DIAGNOSIS — R262 Difficulty in walking, not elsewhere classified: Secondary | ICD-10-CM | POA: Diagnosis not present

## 2015-08-03 ENCOUNTER — Other Ambulatory Visit (INDEPENDENT_AMBULATORY_CARE_PROVIDER_SITE_OTHER): Payer: Self-pay | Admitting: Internal Medicine

## 2015-08-03 DIAGNOSIS — I10 Essential (primary) hypertension: Secondary | ICD-10-CM | POA: Diagnosis not present

## 2015-08-03 DIAGNOSIS — R531 Weakness: Secondary | ICD-10-CM | POA: Diagnosis not present

## 2015-08-03 DIAGNOSIS — M542 Cervicalgia: Secondary | ICD-10-CM | POA: Diagnosis not present

## 2015-08-03 DIAGNOSIS — G894 Chronic pain syndrome: Secondary | ICD-10-CM | POA: Diagnosis not present

## 2015-08-03 DIAGNOSIS — I480 Paroxysmal atrial fibrillation: Secondary | ICD-10-CM | POA: Diagnosis not present

## 2015-08-04 DIAGNOSIS — R262 Difficulty in walking, not elsewhere classified: Secondary | ICD-10-CM | POA: Diagnosis not present

## 2015-08-04 DIAGNOSIS — M542 Cervicalgia: Secondary | ICD-10-CM | POA: Diagnosis not present

## 2015-08-04 DIAGNOSIS — M545 Low back pain: Secondary | ICD-10-CM | POA: Diagnosis not present

## 2015-08-04 DIAGNOSIS — R2681 Unsteadiness on feet: Secondary | ICD-10-CM | POA: Diagnosis not present

## 2015-08-09 DIAGNOSIS — M542 Cervicalgia: Secondary | ICD-10-CM | POA: Diagnosis not present

## 2015-08-09 DIAGNOSIS — R2681 Unsteadiness on feet: Secondary | ICD-10-CM | POA: Diagnosis not present

## 2015-08-09 DIAGNOSIS — R262 Difficulty in walking, not elsewhere classified: Secondary | ICD-10-CM | POA: Diagnosis not present

## 2015-08-09 DIAGNOSIS — M545 Low back pain: Secondary | ICD-10-CM | POA: Diagnosis not present

## 2015-08-11 DIAGNOSIS — R262 Difficulty in walking, not elsewhere classified: Secondary | ICD-10-CM | POA: Diagnosis not present

## 2015-08-11 DIAGNOSIS — M545 Low back pain: Secondary | ICD-10-CM | POA: Diagnosis not present

## 2015-08-11 DIAGNOSIS — R2681 Unsteadiness on feet: Secondary | ICD-10-CM | POA: Diagnosis not present

## 2015-08-11 DIAGNOSIS — M542 Cervicalgia: Secondary | ICD-10-CM | POA: Diagnosis not present

## 2015-08-18 ENCOUNTER — Ambulatory Visit (INDEPENDENT_AMBULATORY_CARE_PROVIDER_SITE_OTHER): Payer: Medicare Other | Admitting: Internal Medicine

## 2015-08-18 ENCOUNTER — Encounter (INDEPENDENT_AMBULATORY_CARE_PROVIDER_SITE_OTHER): Payer: Self-pay | Admitting: Internal Medicine

## 2015-08-18 VITALS — BP 132/72 | HR 66 | Temp 98.0°F | Ht 63.0 in | Wt 201.9 lb

## 2015-08-18 DIAGNOSIS — K219 Gastro-esophageal reflux disease without esophagitis: Secondary | ICD-10-CM | POA: Diagnosis not present

## 2015-08-18 NOTE — Progress Notes (Addendum)
Subjective:    Patient ID: [name], female    DOB: [birth year], 75 y.o.   MRN: [id number]  HPI Hx today for f/u of her GERD. She tells me she is doing good.  She tells me she has been falling since Thanksgiving. She tells me she looses her balance. She has a BM  x 1-2 a day. No melena or BRRB.  She denies any abdominal pain  Maintained on Eliquis for hx of atrial fib.  Review of Systems Past Medical History  Diagnosis Date  . Essential hypertension   . Mixed hyperlipidemia   . Obesity   . GERD (gastroesophageal reflux disease)   . Hypothyroidism   . Migraines   . Osteoarthritis of knee   . IBS (irritable colon syndrome)   . Ocular myasthenia gravis ([place])   . Complication of anesthesia     Low blood pressure, heart rate, O2 sat  . PAF (paroxysmal atrial fibrillation) (HCC)     chads2vasc score is at least 4  . History of pneumonia   . Skin cancer   . Anemia   . Diverticulosis   . Chronic back pain   . Chronic nausea   . Paraganglioma ([place])     Resection 02/2012 (retroperitoneal)  . Depression with anxiety   . LVH (left ventricular hypertrophy)     Past Surgical History  Procedure Laterality Date  . Knee surgery    . Total knee arthroplasty Bilateral     2005 and 2011  . Abdominal hysterectomy    . Cholecystectomy    . Nasal sinus surgery      30 yrs ago  . Colonoscopy  08/24/2011    Procedure: COLONOSCOPY;  Surgeon: Rogene Houston, MD;  [facility];  Service: Endoscopy;  Laterality: N/A;  1200  . Ct guided biopsy  01/16/12  . Givens capsule study  01/29/2012    Procedure: GIVENS CAPSULE STUDY;  Surgeon: Rogene Houston, MD;  [facility];  Service: Endoscopy;  Laterality: N/A;  730  . Laparotomy  03/01/2012    Procedure: EXPLORATORY LAPAROTOMY;  Surgeon: Earnstine Regal, MD;  Location: [facility];  Service: General;  Laterality: N/A;  Exploratory Laparotomy ,Resection Retroperitoneal Mass  . Cardiac catheterization    . Abdominal  hysterectomy      Allergies  Allergen Reactions  . Tylox [Oxycodone-Acetaminophen]     Severe nausea  . Avocado Diarrhea and Nausea And Vomiting  . Doxycycline Monohydrate Nausea And Vomiting  . Macrodantin [Nitrofurantoin Macrocrystal] Nausea And Vomiting  . Other Rash  . Tetracyclines & Related Nausea And Vomiting  . Ampicillin Rash  . Biaxin [Clarithromycin] Rash  . Codeine Itching    Can not take Codeine unless in cough syrup    Current Outpatient Prescriptions on File Prior to Visit  Medication Sig Dispense Refill  . ALPRAZolam (XANAX) 1 MG tablet Take 1 mg by mouth at bedtime as needed for sleep. can be repeated x 1 if needed. insomnia    . amitriptyline (ELAVIL) 25 MG tablet Take 25 mg by mouth at bedtime.    Marland Kitchen apixaban (ELIQUIS) 5 MG TABS tablet Take 1 tablet (5 mg total) by mouth 2 (two) times daily. 60 tablet 12  . Calcium Carbonate-Vit D-Min (CALTRATE 600+D PLUS MINERALS PO) Take 1 tablet by mouth 2 (two) times daily.     . Coenzyme Q10 (CO Q 10) 10 MG CAPS Take 10 mg by mouth every evening.     Marland Kitchen  Cyanocobalamin (B-12) 3000 MCG SUBL Place 1 tablet under the tongue every evening.    . diclofenac sodium (VOLTAREN) 1 % GEL Apply 4 g topically 4 (four) times daily as needed (for pain).     Marland Kitchen docusate sodium (COLACE) 100 MG capsule Take 100 mg by mouth every evening.     . ferrous sulfate 325 (65 FE) MG EC tablet Take 325 mg by mouth every other day. **takes in the evening    . Glucosamine 500 MG CAPS Take 500 mg by mouth every evening.     Marland Kitchen HYDROcodone-acetaminophen (NORCO) 10-325 MG per tablet Take 1 tablet by mouth every 6 (six) hours as needed (pain).     Marland Kitchen lansoprazole (PREVACID) 30 MG capsule TAKE (1) CAPSULE BY MOUTH TWICE DAILY. 60 capsule 6  . levothyroxine (SYNTHROID, LEVOTHROID) 100 MCG tablet Take 100 mcg by mouth daily before breakfast.     . lidocaine (LIDODERM) 5 % Place 1-3 patches onto the skin daily as needed (for pain). Remove & Discard patch within 12 hours  or as directed by MD for pain    . Magnesium 250 MG TABS Take 250 mg by mouth every evening.     . methocarbamol (ROBAXIN) 500 MG tablet Take 500 mg by mouth daily as needed. Muscle spasms    . Multiple Vitamin (MULTIVITAMIN WITH MINERALS) TABS Take 1 tablet by mouth every evening.     . NON FORMULARY Apply 1 application topically 2 (two) times daily. Nail clear from dermatologist    . Omega-3 Fatty Acids (FISH OIL) 1200 MG CAPS Take 1,200 mg by mouth every evening.     . pravastatin (PRAVACHOL) 80 MG tablet Take 80 mg by mouth every evening.     . Probiotic Product (ALIGN PO) Take 1 capsule by mouth every evening.     . propranolol (INDERAL) 20 MG tablet Take 1 tablet (20 mg total) by mouth 2 (two) times daily. 60 tablet 11  . pyridostigmine (MESTINON) 60 MG tablet Take 30 mg by mouth 3 (three) times daily. Take 60 mg by mouth in the AM, take 30 mg by mouth midday and take 30 mg by mouth at bedtime    . SUMAtriptan (IMITREX) 50 MG tablet Take 50 mg by mouth every 2 (two) hours as needed. For migraines     No current facility-administered medications on file prior to visit.        Objective:   Physical Exam Blood pressure 132/72, pulse 66, temperature 98 F (36.7 C), height 5\' 3"  (1.6 m), weight 201 lb 14.4 oz (91.581 kg).   Alert and oriented. Skin warm and dry. Oral mucosa is moist.   . Sclera anicteric, conjunctivae is pink. Thyroid not enlarged. No cervical lymphadenopathy. Lungs clear. Heart regular rate and rhythm.  Abdomen is soft. Bowel sounds are positive. No hepatomegaly. No abdominal masses felt. No tenderness.  No edema to lower extremities        Assessment & Plan:  GERD: controlled with Prevacid. OV 1 year

## 2015-08-18 NOTE — Patient Instructions (Signed)
OV in 1 year.  

## 2015-08-20 DIAGNOSIS — Z1589 Genetic susceptibility to other disease: Secondary | ICD-10-CM | POA: Insufficient documentation

## 2015-09-08 DIAGNOSIS — R911 Solitary pulmonary nodule: Secondary | ICD-10-CM | POA: Diagnosis not present

## 2015-09-08 DIAGNOSIS — S299XXA Unspecified injury of thorax, initial encounter: Secondary | ICD-10-CM | POA: Diagnosis not present

## 2015-09-08 DIAGNOSIS — C755 Malignant neoplasm of aortic body and other paraganglia: Secondary | ICD-10-CM | POA: Diagnosis not present

## 2015-09-08 DIAGNOSIS — R0789 Other chest pain: Secondary | ICD-10-CM | POA: Diagnosis not present

## 2015-09-08 DIAGNOSIS — C768 Malignant neoplasm of other specified ill-defined sites: Secondary | ICD-10-CM | POA: Diagnosis not present

## 2015-09-10 DIAGNOSIS — C760 Malignant neoplasm of head, face and neck: Secondary | ICD-10-CM | POA: Diagnosis not present

## 2015-09-10 DIAGNOSIS — R6884 Jaw pain: Secondary | ICD-10-CM | POA: Diagnosis not present

## 2015-09-13 DIAGNOSIS — R911 Solitary pulmonary nodule: Secondary | ICD-10-CM | POA: Diagnosis not present

## 2015-09-13 DIAGNOSIS — Z1589 Genetic susceptibility to other disease: Secondary | ICD-10-CM | POA: Diagnosis not present

## 2015-09-13 DIAGNOSIS — E041 Nontoxic single thyroid nodule: Secondary | ICD-10-CM | POA: Diagnosis not present

## 2015-09-13 DIAGNOSIS — C755 Malignant neoplasm of aortic body and other paraganglia: Secondary | ICD-10-CM | POA: Diagnosis not present

## 2015-09-15 DIAGNOSIS — C755 Malignant neoplasm of aortic body and other paraganglia: Secondary | ICD-10-CM | POA: Diagnosis not present

## 2015-09-27 ENCOUNTER — Other Ambulatory Visit (HOSPITAL_COMMUNITY): Payer: Self-pay | Admitting: Internal Medicine

## 2015-09-27 DIAGNOSIS — I480 Paroxysmal atrial fibrillation: Secondary | ICD-10-CM | POA: Diagnosis not present

## 2015-09-27 DIAGNOSIS — R918 Other nonspecific abnormal finding of lung field: Secondary | ICD-10-CM | POA: Diagnosis not present

## 2015-09-27 DIAGNOSIS — R911 Solitary pulmonary nodule: Secondary | ICD-10-CM

## 2015-09-27 DIAGNOSIS — I10 Essential (primary) hypertension: Secondary | ICD-10-CM | POA: Diagnosis not present

## 2015-09-27 DIAGNOSIS — C755 Malignant neoplasm of aortic body and other paraganglia: Secondary | ICD-10-CM

## 2015-09-27 DIAGNOSIS — M6281 Muscle weakness (generalized): Secondary | ICD-10-CM | POA: Diagnosis not present

## 2015-10-07 DIAGNOSIS — H538 Other visual disturbances: Secondary | ICD-10-CM | POA: Diagnosis not present

## 2015-10-07 DIAGNOSIS — H2513 Age-related nuclear cataract, bilateral: Secondary | ICD-10-CM | POA: Diagnosis not present

## 2015-10-08 ENCOUNTER — Ambulatory Visit (HOSPITAL_COMMUNITY)
Admission: RE | Admit: 2015-10-08 | Discharge: 2015-10-08 | Disposition: A | Discharge status: Home / Self Care | Payer: Medicare Other | Source: Ambulatory Visit | Attending: Internal Medicine | Admitting: Internal Medicine

## 2015-10-08 DIAGNOSIS — C480 Malignant neoplasm of retroperitoneum: Secondary | ICD-10-CM | POA: Diagnosis not present

## 2015-10-08 DIAGNOSIS — C755 Malignant neoplasm of aortic body and other paraganglia: Secondary | ICD-10-CM | POA: Diagnosis not present

## 2015-10-08 DIAGNOSIS — R911 Solitary pulmonary nodule: Secondary | ICD-10-CM | POA: Insufficient documentation

## 2015-10-08 DIAGNOSIS — K5730 Diverticulosis of large intestine without perforation or abscess without bleeding: Secondary | ICD-10-CM | POA: Insufficient documentation

## 2015-10-08 DIAGNOSIS — Z9049 Acquired absence of other specified parts of digestive tract: Secondary | ICD-10-CM | POA: Insufficient documentation

## 2015-10-08 LAB — POCT I-STAT CREATININE: Creatinine, Ser: 1.2 mg/dL — ABNORMAL HIGH (ref 0.44–1.00)

## 2015-10-08 MED ORDER — IOPAMIDOL (ISOVUE-300) INJECTION 61%
100.0000 mL | Freq: Once | INTRAVENOUS | Status: AC | PRN
  Administered 2015-10-08: 80 mL via INTRAVENOUS

## 2015-10-13 NOTE — Progress Notes (Signed)
[place]  CONSULT NOTE  Patient Care Team: [name], MD as PCP - General (Internal Medicine) Butch Penny, NP as Nurse Practitioner (Internal Medicine)  CHIEF COMPLAINTS/PURPOSE OF CONSULTATION:  Oncology History   [facility] Gene Abnormality Annual 24 hr urine collection for VMA's and catecholamines     Paraganglioma, malignant ([place])   01/01/2012 Imaging    CT abdomen/pelvis no acute findings identified within the abdomen/pelvis. Slight increase in size of enlarge and enhancing periaortic LN      01/10/2012 PET scan    Intensely hypermetabolic solitary L periaortic LN is concerning for a lymphoproliferative process vs a solitary metastasis. Diffuse intensely hypermetabolic thyroid activity is most consistent with a benign thyroiditis       01/16/2012 Initial Biopsy    IR CT guided biopsy of L para aortic lymph node      01/16/2012 Pathology Results    Low grade neoplasm with neuroendocrine differentiation. DDX includes paraganglioma, pheochromocytoma, and carcinoid      01/30/2012 Imaging    US soft tissue head/neck diffusely enlarged hypervascular nodular thyroid gland compatible with thyroiditis      03/01/2012 Surgery    Exploratory laparotomy, resection of retroperitoneal neuroendocrine tumor 3 x 2 x 2 cm with Dr. Armandina Gemma      03/01/2012 Pathology Results    Paraganglioma with associated fibrosis 2.5 cm, negative margins      04/17/2014 PET scan    There is no evidence for residual or recurrent hypermetabolic tumor or adenopathy. 2. Diffusely increased radiotracer uptake throughout both lobes of the thyroid gland. Correlation with patient's TSH is recommended.        HISTORY OF PRESENTING ILLNESS:  [name] 75 y.o. female is here to establish ongoing care for a history of retroperitoneal paraganglioma. It was nonfunctional. She has tested positive for the [facility] gene abnormality per Dr. Jaclyn Prime last note.  She has ocular MG and  has recently given up her driver's license this is difficult for her.  She presents today with concern over some recent imaging studies and rib pain. She notes that she has fallen multiple times since November. Apparently had a rib detail at [facility] that did not show any broken ribs ( I do not have this report) but suggested a lung nodule over approximately the fifth rib on the right. She had a CT of the chest here at [facility] on 7/21 that showed no evidence for residual or recurrent tumor or adenopathy. She is very concerned about ongoing L rib pain today. She notes that it is consistent and limiting her. It hurts with palpation over this area. She notes no rash. She states she can ride an exercise bike for 30 min but now because of the pain it is more difficult to. In addition it is difficult to lie on that side.   She uses her walker outside of her home. She goes to the [facility] and participates in water activities. She does do some weight lifting. She has had both knees replaced.  Currently, she denies change in appetite or energy level. No abdominal pain.  Her initial disease was found when she presented to Dr. Laural Golden with ongoing abdominal discomfort, CT abdomen/pelvis noted a periaortic enlarged LN. Surgery was with Dr. Harlow Asa. Imaging to date has not shown recurrence.   MEDICAL HISTORY:  Past Medical History:  Diagnosis Date  . Anemia   . Chronic back pain   . Chronic nausea   . Complication of anesthesia  Low blood pressure, heart rate, O2 sat  . Depression with anxiety   . Diverticulosis   . Essential hypertension   . GERD (gastroesophageal reflux disease)   . History of pneumonia   . Hypothyroidism   . IBS (irritable colon syndrome)   . LVH (left ventricular hypertrophy)   . Migraines   . Mixed hyperlipidemia   . Obesity   . Ocular myasthenia gravis ([place])   . Osteoarthritis of knee   . PAF (paroxysmal atrial fibrillation) (HCC)    chads2vasc score is at least 4  . Paraganglioma  ([place])    Resection 02/2012 (retroperitoneal)  . Skin cancer     SURGICAL HISTORY: Past Surgical History:  Procedure Laterality Date  . ABDOMINAL HYSTERECTOMY    . ABDOMINAL HYSTERECTOMY    . CARDIAC CATHETERIZATION    . CHOLECYSTECTOMY    . COLONOSCOPY  08/24/2011   Procedure: COLONOSCOPY;  Surgeon: Rogene Houston, MD;  Location: [facility] ENDO SUITE;  Service: Endoscopy;  Laterality: N/A;  1200  . CT guided biopsy  01/16/12  . GIVENS CAPSULE STUDY  01/29/2012   Procedure: GIVENS CAPSULE STUDY;  Surgeon: Rogene Houston, MD;  Location: [facility] ENDO SUITE;  Service: Endoscopy;  Laterality: N/A;  730  . KNEE SURGERY    . LAPAROTOMY  03/01/2012   Procedure: EXPLORATORY LAPAROTOMY;  Surgeon: Earnstine Regal, MD;  Location: [facility] ORS;  Service: General;  Laterality: N/A;  Exploratory Laparotomy ,Resection Retroperitoneal Mass  . NASAL SINUS SURGERY     30 yrs ago  . TOTAL KNEE ARTHROPLASTY Bilateral    2005 and 2011    SOCIAL HISTORY: Social History   Social History  . Marital status: Widowed    Spouse name: N/A  . Number of children: N/A  . Years of education: N/A   Occupational History  . Not on file.   Social History Main Topics  . Smoking status: Never Smoker  . Smokeless tobacco: Never Used  . Alcohol use No  . Drug use: No  . Sexual activity: Not on file   Other Topics Concern  . Not on file   Social History Narrative   Lives in [place] alone.  Retired Quarry manager.   Goes to [employer], water activities. Just gave up her Driver's License secondary to ocular MG.  4 children, 3 times widowed. Her last husband passed in April. 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 8 grandchildren, 8 great grandchildren  FAMILY HISTORY: Family History  Problem Relation Age of Onset  . Inflammatory bowel disease Maternal Grandmother    Father died at 61 from an MI, he had emphysema Mother died at 60  from CVA One sister deceased from alzheimers One sister alive and healthy  ALLERGIES:  is allergic to tylox [oxycodone-acetaminophen]; avocado; doxycycline monohydrate; macrodantin [nitrofurantoin macrocrystal]; other; tetracyclines & related; ampicillin; biaxin [clarithromycin]; and codeine.  MEDICATIONS:  Current Outpatient Prescriptions  Medication Sig Dispense Refill  . ALPRAZolam (XANAX) 1 MG tablet Take 1 mg by mouth at bedtime as needed for sleep. can be repeated x 1 if needed. insomnia    . amitriptyline (ELAVIL) 25 MG tablet Take 25 mg by mouth at bedtime.    Marland Kitchen apixaban (ELIQUIS) 5 MG TABS tablet Take 1 tablet (5 mg total) by mouth 2 (two) times daily. 60 tablet 12  . Calcium Carbonate-Vit D-Min (CALTRATE 600+D PLUS MINERALS PO) Take 1 tablet  by mouth 2 (two) times daily.     . Coenzyme Q10 (CO Q 10) 10 MG CAPS Take 10 mg by mouth every evening.     . Cyanocobalamin (B-12) 3000 MCG SUBL Place 1 tablet under the tongue every evening.    . diclofenac sodium (VOLTAREN) 1 % GEL Apply 4 g topically 4 (four) times daily as needed (for pain).     Marland Kitchen docusate sodium (COLACE) 100 MG capsule Take 100 mg by mouth every evening.     . ferrous sulfate 325 (65 FE) MG EC tablet Take 325 mg by mouth every other day. **takes in the evening    . Glucosamine 500 MG CAPS Take 500 mg by mouth every evening.     Marland Kitchen HYDROcodone-acetaminophen (NORCO) 10-325 MG per tablet Take 1 tablet by mouth every 6 (six) hours as needed (pain).     Marland Kitchen lansoprazole (PREVACID) 30 MG capsule TAKE (1) CAPSULE BY MOUTH TWICE DAILY. 60 capsule 6  . levothyroxine (SYNTHROID, LEVOTHROID) 100 MCG tablet Take 100 mcg by mouth daily before breakfast.     . lidocaine (LIDODERM) 5 % Place 1-3 patches onto the skin daily as needed (for pain). Remove & Discard patch within 12 hours or as directed by MD for pain    . losartan (COZAAR) 50 MG tablet Take 50 mg by mouth daily.    . Magnesium 250 MG TABS Take 250 mg by mouth every evening.       . methocarbamol (ROBAXIN) 500 MG tablet Take 500 mg by mouth daily as needed. Muscle spasms    . Multiple Vitamin (MULTIVITAMIN WITH MINERALS) TABS Take 1 tablet by mouth every evening.     . NON FORMULARY Apply 1 application topically 2 (two) times daily. Nail clear from dermatologist    . Omega-3 Fatty Acids (FISH OIL) 1200 MG CAPS Take 1,200 mg by mouth every evening.     . pravastatin (PRAVACHOL) 80 MG tablet Take 80 mg by mouth every evening.     . Probiotic Product (ALIGN PO) Take 1 capsule by mouth every evening.     . propranolol (INDERAL) 20 MG tablet Take 1 tablet (20 mg total) by mouth 2 (two) times daily. 60 tablet 11  . pyridostigmine (MESTINON) 60 MG tablet Take 30 mg by mouth 3 (three) times daily. Take 60 mg by mouth in the AM, take 30 mg by mouth midday and take 30 mg by mouth at bedtime    . SUMAtriptan (IMITREX) 50 MG tablet Take 50 mg by mouth every 2 (two) hours as needed. For migraines     No current facility-administered medications for this visit.     Review of Systems  Constitutional: Negative for chills, diaphoresis, fever, malaise/fatigue and weight loss.  HENT: Negative for congestion, ear discharge, ear pain, hearing loss, nosebleeds, sore throat and tinnitus.   Eyes: Positive for blurred vision and double vision. Negative for pain, discharge and redness.  Respiratory: Positive for shortness of breath. Negative for cough, hemoptysis, sputum production, wheezing and stridor.   Cardiovascular: Negative for palpitations, orthopnea, claudication, leg swelling and PND.       L sided chest wall/rib pain  Gastrointestinal: Negative for abdominal pain, blood in stool, constipation, diarrhea, heartburn, melena, nausea and vomiting.  Genitourinary: Negative for dysuria, flank pain, frequency, hematuria and urgency.  Musculoskeletal: Positive for falls. Negative for joint pain, myalgias and neck pain.  Skin: Negative for itching and rash.  Neurological: Negative for  dizziness, tingling, tremors, sensory change, speech  change, focal weakness, seizures, loss of consciousness and headaches.  Endo/Heme/Allergies: Negative for environmental allergies and polydipsia. Does not bruise/bleed easily.  Psychiatric/Behavioral: Negative for depression, hallucinations, memory loss, substance abuse and suicidal ideas. The patient is not nervous/anxious and does not have insomnia.    14 point ROS was done and is otherwise as detailed above or in HPI   PHYSICAL EXAMINATION: ECOG PERFORMANCE STATUS: 1 - Symptomatic but completely ambulatory  Vitals:   10/14/15 1427  BP: (!) 159/71  Pulse: (!) 53  Resp: 18  Temp: 98 F (36.7 C)   Filed Weights   10/14/15 1427  Weight: 203 lb 4.8 oz (92.2 kg)     Physical Exam  Constitutional: She is oriented to person, place, and time and well-developed, well-nourished, and in no distress.  HENT:  Head: Normocephalic and atraumatic.  Nose: Nose normal.  Mouth/Throat: Oropharynx is clear and moist. No oropharyngeal exudate.  Eyes: Conjunctivae and EOM are normal. Pupils are equal, round, and reactive to light. Right eye exhibits no discharge. Left eye exhibits no discharge. No scleral icterus.  Neck: Normal range of motion. Neck supple. No tracheal deviation present. No thyromegaly present.  Cardiovascular: Normal rate, regular rhythm and normal heart sounds.  Exam reveals no gallop and no friction rub.   No murmur heard. Pulmonary/Chest: Effort normal and breath sounds normal. She has no wheezes. She has no rales.  Discomfort with palpation over left anterior/lateral chest wall, no palpable abnormality noted  Abdominal: Soft. Bowel sounds are normal. She exhibits no distension and no mass. There is no tenderness. There is no rebound and no guarding.  Musculoskeletal: Normal range of motion. She exhibits no edema.  Lymphadenopathy:    She has no cervical adenopathy.  Neurological: She is alert and oriented to person, place,  and time. No cranial nerve deficit. Coordination normal.  Uses walker  Skin: Skin is warm and dry. No rash noted.  Psychiatric: Mood, memory, affect and judgment normal.  Nursing note and vitals reviewed.   LABORATORY DATA:  I have reviewed the data as listed Lab Results  Component Value Date   WBC 8.9 05/04/2015   HGB 14.9 05/04/2015   HCT 46.0 05/04/2015   MCV 100.0 05/04/2015   PLT 225 05/04/2015   CMP     Component Value Date/Time   NA 137 05/04/2015 1559   K 4.2 05/04/2015 1559   CL 103 05/04/2015 1559   CO2 24 05/04/2015 1559   GLUCOSE 99 05/04/2015 1559   BUN 17 05/04/2015 1559   CREATININE 1.20 (H) 10/08/2015 0950   CREATININE 1.05 (H) 05/04/2015 1559   CALCIUM 9.6 05/04/2015 1559   PROT 6.8 02/09/2015 1755   ALBUMIN 3.5 02/09/2015 1755   AST 19 02/09/2015 1755   ALT 13 (L) 02/09/2015 1755   ALKPHOS 65 02/09/2015 1755   BILITOT 0.5 02/09/2015 1755   GFRNONAA 56 (L) 02/09/2015 1755   GFRAA >60 02/09/2015 1755         RADIOGRAPHIC STUDIES: I have personally reviewed the radiological images as listed and agreed with the findings in the report. Study Result   CLINICAL DATA:  Patient with history of pulmonary nodule. History of malignancy and retroperitoneal adenopathy. History of retroperitoneal paraganglioma.  EXAM: CT CHEST, ABDOMEN AND PELVIS WITHOUT CONTRAST  TECHNIQUE: Multidetector CT imaging of the chest, abdomen and pelvis was performed following the standard protocol without IV contrast.  COMPARISON:  PET-CT 04/17/2014  FINDINGS: CT CHEST FINDINGS  Mediastinum/Lymph Nodes: Unchanged calcification within the left thyroid  lobe. No enlarged axillary, mediastinal or hilar lymphadenopathy. No pericardial effusion. Aorta and main pulmonary artery are normal in caliber. Esophagus is normal in appearance.  Lungs/Pleura: Central airways are patent. Grossly unchanged scarring within the right lower lobe. No suspicious pulmonary nodules  or masses identified. No pleural effusion or pneumothorax.  Musculoskeletal: Thoracic spine degenerative changes. No aggressive or acute appearing osseous lesions.  CT ABDOMEN PELVIS FINDINGS  Hepatobiliary: Liver is normal in size and contour. No focal hepatic lesion identified. Patient status post cholecystectomy. Stable prominence of the common bile duct status post cholecystectomy, likely physiologic.  Pancreas: Unremarkable  Spleen: Unremarkable  Adrenals/Urinary Tract: Normal adrenal glands. Kidneys enhance symmetrically with contrast. Stable too small to characterize low-attenuation lesions within the right kidney. Extrarenal pelvis bilaterally. Urinary bladder is unremarkable.  Stomach/Bowel: Sigmoid colonic diverticulosis without evidence for acute diverticulitis. No abnormal bowel wall thickening or evidence for bowel obstruction. No free fluid or free intraperitoneal air. Normal morphology of the stomach.  Vascular/Lymphatic: Normal caliber abdominal aorta. Calcified atherosclerotic plaque. Left periaortic surgical clips. No evidence for recurrent mass. No retroperitoneal adenopathy.  Reproductive: Status post hysterectomy.  Other: None.  Musculoskeletal: No aggressive or acute appearing osseous lesions. Lumbar spine degenerative changes. No aggressive or acute appearing osseous lesions.  IMPRESSION: No evidence for residual or recurrent tumor or adenopathy.   Electronically Signed   By: Lovey Newcomer M.D.   On: 10/08/2015 14:00     ASSESSMENT & PLAN:  Retroperitoneal Malignant paraganglioma R rib pain Ocular MG L thyroid nodule, calcified Thyroidit Macrocytosis Stage II CKD Monoallelic mutation of SDHC gene -- increased lifetime risk of paraganglioma/pheochromocytoma (GIST/RCC) CHEK 2 varian of unknown significance also noted on genetic testing  Urine studies are available from Dr. Wende Neighbors. He did a 24 hour urine for catecholamines  and VMA, all studies were WNL.  I will send results to be scanned into EPIC. Collection date was 6//28/2017.   CT of the chest was reviewed with the patient in addition to her Physical exam from today. I cannot explain her L chest discomfort. Because it hurts more with movement and palpation it seems to be musculoskeletal.  We can obtain a L rib series. She is agreeable. No evidence of recurrent malignancy.   Per records from [facility], she has had a B12 and folate to evaluate her macrocytosis. For now would continue to follow.   RTC 2 months, at which time if she is back to her prior baseline will move her back out to annual visits.  She follows with Dr. Nevada Crane for her primary care needs.   Orders Placed This Encounter  Procedures  . DG Ribs Unilateral Left    Standing Status:   Future    Number of Occurrences:   1    Standing Expiration Date:   12/14/2016    Order Specific Question:   Reason for Exam (SYMPTOM  OR DIAGNOSIS REQUIRED)    Answer:   Left sided chest pain    Order Specific Question:   Preferred imaging location?    Answer:   [facility]    All questions were answered. The patient knows to call the clinic with any problems, questions or concerns.  This document serves as a record of services personally performed by Ancil Linsey, MD. It was created on her behalf by Arlyce Harman, a trained medical scribe. The creation of this record is based on the scribe's personal observations and the provider's statements to them. This document has been checked and  approved by the attending provider.  I have reviewed the above documentation for accuracy and completeness, and I agree with the above.  This note was electronically signed.    Molli Hazard, MD  10/14/2015

## 2015-10-14 ENCOUNTER — Encounter (HOSPITAL_COMMUNITY): Payer: Self-pay | Admitting: Hematology & Oncology

## 2015-10-14 ENCOUNTER — Other Ambulatory Visit (HOSPITAL_COMMUNITY): Payer: Self-pay | Admitting: Internal Medicine

## 2015-10-14 ENCOUNTER — Ambulatory Visit (HOSPITAL_COMMUNITY)
Admission: RE | Admit: 2015-10-14 | Discharge: 2015-10-14 | Disposition: A | Discharge status: Home / Self Care | Payer: Medicare Other | Source: Ambulatory Visit | Attending: Oncology | Admitting: Oncology

## 2015-10-14 ENCOUNTER — Encounter (HOSPITAL_COMMUNITY): Payer: Medicare Other | Attending: Hematology & Oncology | Admitting: Hematology & Oncology

## 2015-10-14 VITALS — BP 159/71 | HR 53 | Temp 98.0°F | Resp 18 | Ht 63.0 in | Wt 203.3 lb

## 2015-10-14 DIAGNOSIS — D7589 Other specified diseases of blood and blood-forming organs: Secondary | ICD-10-CM | POA: Diagnosis not present

## 2015-10-14 DIAGNOSIS — R079 Chest pain, unspecified: Secondary | ICD-10-CM

## 2015-10-14 DIAGNOSIS — Z1589 Genetic susceptibility to other disease: Secondary | ICD-10-CM | POA: Diagnosis not present

## 2015-10-14 DIAGNOSIS — R0781 Pleurodynia: Secondary | ICD-10-CM | POA: Diagnosis not present

## 2015-10-14 DIAGNOSIS — G7000 Myasthenia gravis without (acute) exacerbation: Secondary | ICD-10-CM

## 2015-10-14 DIAGNOSIS — N182 Chronic kidney disease, stage 2 (mild): Secondary | ICD-10-CM

## 2015-10-14 DIAGNOSIS — C755 Malignant neoplasm of aortic body and other paraganglia: Secondary | ICD-10-CM

## 2015-10-14 DIAGNOSIS — Z1231 Encounter for screening mammogram for malignant neoplasm of breast: Secondary | ICD-10-CM

## 2015-10-14 NOTE — Patient Instructions (Signed)
[place] at [facility] Discharge Instructions  RECOMMENDATIONS MADE BY THE CONSULTANT AND ANY TEST RESULTS WILL BE SENT TO YOUR REFERRING PHYSICIAN.  You saw Dr. Whitney Muse today. Return to clinic in 2 months. X-rays today.  Thank you for choosing [place] at [facility] to provide your oncology and hematology care.  To afford each patient quality time with our provider, please arrive at least 15 minutes before your scheduled appointment time.   Beginning January 23rd 2017 lab work for the [facility] will be done in the  Main lab at [facility] on 1st floor. If you have a lab appointment with the [facility] please come in thru the  Main Entrance and check in at the main information desk  You need to re-schedule your appointment should you arrive 10 or more minutes late.  We strive to give you quality time with our providers, and arriving late affects you and other patients whose appointments are after yours.  Also, if you no show three or more times for appointments you may be dismissed from the clinic at the providers discretion.     Again, thank you for choosing [facility].  Our hope is that these requests will decrease the amount of time that you wait before being seen by our physicians.       _____________________________________________________________  Should you have questions after your visit to [facility], please contact our office at [phone] between the hours of 8:30 a.m. and 4:30 p.m.  Voicemails left after 4:30 p.m. will not be returned until the following business day.  For prescription refill requests, have your pharmacy contact our office.         Resources For Cancer Patients and their Caregivers ? American Cancer Society: Can assist with transportation, wigs, general needs, runs Look Good Feel Better.        346-836-9071 ? Cancer Care: Provides financial assistance, online  support groups, medication/co-pay assistance.  1-800-813-HOPE 984-314-4102) ? [place] Assists [place] cancer patients and their families through emotional , educational and financial support.  2545802063 ? [place] DSS Where to apply for food stamps, Medicaid and utility assistance. 7038566493 ? RCATS: Transportation to medical appointments. 901-274-5702 ? Social Security Administration: May apply for disability if have a Stage IV cancer. 856 208 2328 (401)801-1585 ? [employer], Disability and Transit Services: Assists with nutrition, care and transit needs. [place] Support Programs: @10RELATIVEDAYS @ > Cancer Support Group  2nd Tuesday of the month 1pm-2pm, Journey Room  > Creative Journey  3rd Tuesday of the month 1130am-1pm, Journey Room  > Look Good Feel Better  1st Wednesday of the month 10am-12 noon, Journey Room (Call [place] to register [phone])

## 2015-10-21 ENCOUNTER — Ambulatory Visit (HOSPITAL_COMMUNITY)
Admission: RE | Admit: 2015-10-21 | Discharge: 2015-10-21 | Disposition: A | Discharge status: Home / Self Care | Payer: Medicare Other | Source: Ambulatory Visit | Attending: Internal Medicine | Admitting: Internal Medicine

## 2015-10-21 DIAGNOSIS — Z1231 Encounter for screening mammogram for malignant neoplasm of breast: Secondary | ICD-10-CM | POA: Diagnosis not present

## 2015-10-25 ENCOUNTER — Other Ambulatory Visit (HOSPITAL_COMMUNITY): Payer: Self-pay | Admitting: Oncology

## 2015-10-25 DIAGNOSIS — D483 Neoplasm of uncertain behavior of retroperitoneum: Secondary | ICD-10-CM

## 2015-11-01 DIAGNOSIS — L570 Actinic keratosis: Secondary | ICD-10-CM | POA: Diagnosis not present

## 2015-11-01 DIAGNOSIS — Z85828 Personal history of other malignant neoplasm of skin: Secondary | ICD-10-CM | POA: Diagnosis not present

## 2015-11-05 ENCOUNTER — Ambulatory Visit (HOSPITAL_COMMUNITY)
Admission: RE | Admit: 2015-11-05 | Discharge: 2015-11-05 | Disposition: A | Discharge status: Home / Self Care | Payer: Medicare Other | Source: Ambulatory Visit | Attending: Oncology | Admitting: Oncology

## 2015-11-05 DIAGNOSIS — D483 Neoplasm of uncertain behavior of retroperitoneum: Secondary | ICD-10-CM | POA: Diagnosis not present

## 2015-11-05 DIAGNOSIS — M4804 Spinal stenosis, thoracic region: Secondary | ICD-10-CM | POA: Diagnosis not present

## 2015-11-05 DIAGNOSIS — M5134 Other intervertebral disc degeneration, thoracic region: Secondary | ICD-10-CM | POA: Diagnosis not present

## 2015-11-05 DIAGNOSIS — M4802 Spinal stenosis, cervical region: Secondary | ICD-10-CM | POA: Insufficient documentation

## 2015-11-05 MED ORDER — GADOBENATE DIMEGLUMINE 529 MG/ML IV SOLN
10.0000 mL | Freq: Once | INTRAVENOUS | Status: AC | PRN
  Administered 2015-11-05: 10 mL via INTRAVENOUS

## 2015-11-06 ENCOUNTER — Encounter (HOSPITAL_COMMUNITY): Payer: Self-pay | Admitting: Hematology & Oncology

## 2015-11-06 DIAGNOSIS — C755 Malignant neoplasm of aortic body and other paraganglia: Secondary | ICD-10-CM | POA: Insufficient documentation

## 2015-11-06 HISTORY — DX: Malignant neoplasm of aortic body and other paraganglia: C75.5

## 2015-11-29 DIAGNOSIS — E039 Hypothyroidism, unspecified: Secondary | ICD-10-CM | POA: Diagnosis not present

## 2015-11-29 DIAGNOSIS — E782 Mixed hyperlipidemia: Secondary | ICD-10-CM | POA: Diagnosis not present

## 2015-12-01 DIAGNOSIS — G4709 Other insomnia: Secondary | ICD-10-CM | POA: Diagnosis not present

## 2015-12-01 DIAGNOSIS — N182 Chronic kidney disease, stage 2 (mild): Secondary | ICD-10-CM | POA: Diagnosis not present

## 2015-12-01 DIAGNOSIS — E782 Mixed hyperlipidemia: Secondary | ICD-10-CM | POA: Diagnosis not present

## 2015-12-01 DIAGNOSIS — E039 Hypothyroidism, unspecified: Secondary | ICD-10-CM | POA: Diagnosis not present

## 2015-12-01 DIAGNOSIS — G894 Chronic pain syndrome: Secondary | ICD-10-CM | POA: Diagnosis not present

## 2015-12-01 DIAGNOSIS — I480 Paroxysmal atrial fibrillation: Secondary | ICD-10-CM | POA: Diagnosis not present

## 2015-12-01 DIAGNOSIS — R296 Repeated falls: Secondary | ICD-10-CM | POA: Diagnosis not present

## 2015-12-01 DIAGNOSIS — I10 Essential (primary) hypertension: Secondary | ICD-10-CM | POA: Diagnosis not present

## 2015-12-15 ENCOUNTER — Ambulatory Visit (HOSPITAL_COMMUNITY): Payer: Medicare Other | Admitting: Hematology & Oncology

## 2015-12-16 ENCOUNTER — Telehealth (INDEPENDENT_AMBULATORY_CARE_PROVIDER_SITE_OTHER): Payer: Self-pay | Admitting: Internal Medicine

## 2015-12-16 ENCOUNTER — Other Ambulatory Visit: Payer: Self-pay

## 2015-12-16 DIAGNOSIS — K219 Gastro-esophageal reflux disease without esophagitis: Secondary | ICD-10-CM

## 2015-12-16 MED ORDER — PROPRANOLOL HCL 20 MG PO TABS: 20.0000 mg | ORAL_TABLET | Freq: Two times a day (BID) | ORAL | 3 refills | Status: DC

## 2015-12-16 MED ORDER — LANSOPRAZOLE 30 MG PO CPDR: 30.0000 mg | DELAYED_RELEASE_CAPSULE | Freq: Two times a day (BID) | ORAL | 3 refills | Status: DC

## 2015-12-16 NOTE — Telephone Encounter (Signed)
Rx for Lansoproazole 30mg  [facility]

## 2015-12-16 NOTE — Telephone Encounter (Signed)
Refilled propraolol 90 day rfx3

## 2015-12-21 ENCOUNTER — Encounter (HOSPITAL_COMMUNITY): Payer: Medicare Other | Attending: Hematology & Oncology | Admitting: Hematology & Oncology

## 2015-12-21 VITALS — BP 127/54 | HR 56 | Temp 98.1°F | Resp 18 | Wt 208.3 lb

## 2015-12-21 DIAGNOSIS — Z1589 Genetic susceptibility to other disease: Secondary | ICD-10-CM | POA: Diagnosis not present

## 2015-12-21 DIAGNOSIS — Z0000 Encounter for general adult medical examination without abnormal findings: Secondary | ICD-10-CM

## 2015-12-21 DIAGNOSIS — C755 Malignant neoplasm of aortic body and other paraganglia: Secondary | ICD-10-CM

## 2015-12-21 DIAGNOSIS — Z23 Encounter for immunization: Secondary | ICD-10-CM | POA: Diagnosis not present

## 2015-12-21 DIAGNOSIS — G7000 Myasthenia gravis without (acute) exacerbation: Secondary | ICD-10-CM | POA: Diagnosis not present

## 2015-12-21 DIAGNOSIS — D7589 Other specified diseases of blood and blood-forming organs: Secondary | ICD-10-CM

## 2015-12-21 MED ORDER — INFLUENZA VAC SPLIT QUAD 0.5 ML IM SUSY
0.5000 mL | PREFILLED_SYRINGE | Freq: Once | INTRAMUSCULAR | Status: AC
  Administered 2015-12-21: 0.5 mL via INTRAMUSCULAR
  Filled 2015-12-21: qty 0.5

## 2015-12-21 NOTE — Patient Instructions (Signed)
[place] at [facility] Discharge Instructions  RECOMMENDATIONS MADE BY THE CONSULTANT AND ANY TEST RESULTS WILL BE SENT TO YOUR REFERRING PHYSICIAN.  You saw Dr. Whitney Muse today. Follow up in 6 months. 24 hour urine prior to follow up.  Thank you for choosing [place] at [facility] to provide your oncology and hematology care.  To afford each patient quality time with our provider, please arrive at least 15 minutes before your scheduled appointment time.   Beginning January 23rd 2017 lab work for the [facility] will be done in the  Main lab at [facility] on 1st floor. If you have a lab appointment with the [facility] please come in thru the  Main Entrance and check in at the main information desk  You need to re-schedule your appointment should you arrive 10 or more minutes late.  We strive to give you quality time with our providers, and arriving late affects you and other patients whose appointments are after yours.  Also, if you no show three or more times for appointments you may be dismissed from the clinic at the providers discretion.     Again, thank you for choosing [facility].  Our hope is that these requests will decrease the amount of time that you wait before being seen by our physicians.       _____________________________________________________________  Should you have questions after your visit to [facility], please contact our office at [phone] between the hours of 8:30 a.m. and 4:30 p.m.  Voicemails left after 4:30 p.m. will not be returned until the following business day.  For prescription refill requests, have your pharmacy contact our office.         Resources For Cancer Patients and their Caregivers ? American Cancer Society: Can assist with transportation, wigs, general needs, runs Look Good Feel Better.        ([phone] ? Cancer Care: Provides financial assistance,  online support groups, medication/co-pay assistance.  1-800-813-HOPE 639-731-5060) ? [place] Assists [place] cancer patients and their families through emotional , educational and financial support.  435-835-5208 ? [place] DSS Where to apply for food stamps, Medicaid and utility assistance. 838-233-9697 ? RCATS: Transportation to medical appointments. 224-074-4313 ? Social Security Administration: May apply for disability if have a Stage IV cancer. 210-735-2936 (606) 369-6055 ? [employer], Disability and Transit Services: Assists with nutrition, care and transit needs. [place] Support Programs: @10RELATIVEDAYS @ > Cancer Support Group  2nd Tuesday of the month 1pm-2pm, Journey Room  > Creative Journey  3rd Tuesday of the month 1130am-1pm, Journey Room  > Look Good Feel Better  1st Wednesday of the month 10am-12 noon, Journey Room (Call [place] to register [phone])

## 2015-12-21 NOTE — Progress Notes (Signed)
[name]  CONSULT NOTE  Patient Care Team: [name], MD as PCP - General (Internal Medicine) Butch Penny, NP as Nurse Practitioner (Internal Medicine)  CHIEF COMPLAINTS/PURPOSE OF CONSULTATION:  Oncology History   [facility] Gene Abnormality Annual 24 hr urine collection for VMA's and catecholamines     Paraganglioma, malignant ([place])   01/01/2012 Imaging    CT abdomen/pelvis no acute findings identified within the abdomen/pelvis. Slight increase in size of enlarge and enhancing periaortic LN      01/10/2012 PET scan    Intensely hypermetabolic solitary L periaortic LN is concerning for a lymphoproliferative process vs a solitary metastasis. Diffuse intensely hypermetabolic thyroid activity is most consistent with a benign thyroiditis       01/16/2012 Initial Biopsy    IR CT guided biopsy of L para aortic lymph node      01/16/2012 Pathology Results    Low grade neoplasm with neuroendocrine differentiation. DDX includes paraganglioma, pheochromocytoma, and carcinoid      01/30/2012 Imaging    US soft tissue head/neck diffusely enlarged hypervascular nodular thyroid gland compatible with thyroiditis      03/01/2012 Surgery    Exploratory laparotomy, resection of retroperitoneal neuroendocrine tumor 3 x 2 x 2 cm with Dr. Armandina Gemma      03/01/2012 Pathology Results    Paraganglioma with associated fibrosis 2.5 cm, negative margins      04/17/2014 PET scan    There is no evidence for residual or recurrent hypermetabolic tumor or adenopathy. 2. Diffusely increased radiotracer uptake throughout both lobes of the thyroid gland. Correlation with patient's TSH is recommended.       09/10/2015 Imaging    MRI neck with/without contrast at [facility]. No neck mass or LAD. Likely mildly asymmetric lingual tonsil tissue in the L vallecular. 1.3 cm likely calcified L thyroid nodule.       10/08/2015 Imaging    CT C/A/P No evidence for residual or recurrent  tumor or adenopathy.       HISTORY OF PRESENTING ILLNESS:  [name] 75 y.o. female is here for a follow up of retroperitoneal paraganglioma. She tested positive for the [facility] gene abnormality.   [name] is accompanied by her daughter. MRI imaging previously performed of her thoracic and cervical vertebrae. [name] says she used to go the [employer] daily, but now she cannot drive. She still does yoga and piliates at home as well as some gardening. [facility] She tries to stretch her back in the bed.  If she lays on her side she notices the pain radiates down her leg.   Patient says she has been managing her pain for the last 20 years. She notices her balance has gotten a little unsteady now. She also experiences shortness of breath and palpitations, this is chronic and unchanged.   She is up to date on her mammogram. 24 hour urine studies were performed earlier this year. She continues to follow with Dr. Nevada Crane for her well care. No other complaints today. Appetite is unchanged.   MEDICAL HISTORY:  Past Medical History:  Diagnosis Date  . Anemia   . Chronic back pain   . Chronic nausea   . Complication of anesthesia    Low blood pressure, heart rate, O2 sat  . Depression with anxiety   . Diverticulosis   . Essential hypertension   . GERD (gastroesophageal reflux disease)   . History of pneumonia   .  Hypothyroidism   . IBS (irritable colon syndrome)   . LVH (left ventricular hypertrophy)   . Migraines   . Mixed hyperlipidemia   . Obesity   . Ocular myasthenia gravis ([place])   . Osteoarthritis of knee   . PAF (paroxysmal atrial fibrillation) (H[facility])    chads2vasc score is at least 4  . Paraganglioma ([place])    Resection 02/2012 (retroperitoneal)  . Skin cancer     SURGICAL HISTORY: Past Surgical History:  Procedure Laterality Date  . ABDOMINAL HYSTERECTOMY    . ABDOMINAL HYSTERECTOMY    . CARDIAC CATHETERIZATION    . CHOLECYSTECTOMY    . COLONOSCOPY   08/24/2011   Procedure: COLONOSCOPY;  Surgeon: Rogene Houston, MD;  Location: [facility] ENDO SUITE;  Service: Endoscopy;  Laterality: N/A;  1200  . CT guided biopsy  01/16/12  . GIVENS CAPSULE STUDY  01/29/2012   Procedure: GIVENS CAPSULE STUDY;  Surgeon: Rogene Houston, MD;  Location: [facility] ENDO SUITE;  Service: Endoscopy;  Laterality: N/A;  730  . KNEE SURGERY    . LAPAROTOMY  03/01/2012   Procedure: EXPLORATORY LAPAROTOMY;  Surgeon: Earnstine Regal, MD;  Location: [facility] ORS;  Service: General;  Laterality: N/A;  Exploratory Laparotomy ,Resection Retroperitoneal Mass  . NASAL SINUS SURGERY     30 yrs ago  . TOTAL KNEE ARTHROPLASTY Bilateral    2005 and 2011    SOCIAL HISTORY: Social History   Social History  . Marital status: Widowed    Spouse name: N/A  . Number of children: N/A  . Years of education: N/A   Occupational History  . Not on file.   Social History Main Topics  . Smoking status: Never Smoker  . Smokeless tobacco: Never Used  . Alcohol use No  . Drug use: No  . Sexual activity: Not on file   Other Topics Concern  . Not on file   Social History Narrative   Lives in [place] alone.  Retired Quarry manager.   Goes to [employer], water activities. Just gave up her Driver's License secondary to ocular MG.  4 children, 3 times widowed. Her last husband passed in April. 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 8 grandchildren, 8 great grandchildren  FAMILY HISTORY: Family History  Problem Relation Age of Onset  . Inflammatory bowel disease Maternal Grandmother    Father died at 63 from an MI, he had emphysema Mother died at 30 from CVA One sister deceased from alzheimers One sister alive and healthy  ALLERGIES:  is allergic to tylox [oxycodone-acetaminophen]; avocado; doxycycline monohydrate; macrodantin [nitrofurantoin macrocrystal]; other; tetracyclines & related; ampicillin;  biaxin [clarithromycin]; and codeine.  MEDICATIONS:  Current Outpatient Prescriptions  Medication Sig Dispense Refill  . ALPRAZolam (XANAX) 1 MG tablet Take 1 mg by mouth at bedtime as needed for sleep. can be repeated x 1 if needed. insomnia    . amitriptyline (ELAVIL) 25 MG tablet Take 25 mg by mouth at bedtime.    Marland Kitchen apixaban (ELIQUIS) 5 MG TABS tablet Take 1 tablet (5 mg total) by mouth 2 (two) times daily. 60 tablet 12  . Calcium Carbonate-Vit D-Min (CALTRATE 600+D PLUS MINERALS PO) Take 1 tablet by mouth 2 (two) times daily.     . Coenzyme Q10 (CO Q 10) 10 MG CAPS Take 10 mg by mouth every evening.     . Cyanocobalamin (B-12) 3000 MCG SUBL Place  1 tablet under the tongue every evening.    . diclofenac sodium (VOLTAREN) 1 % GEL Apply 4 g topically 4 (four) times daily as needed (for pain).     Marland Kitchen docusate sodium (COLACE) 100 MG capsule Take 100 mg by mouth every evening.     . ferrous sulfate 325 (65 FE) MG EC tablet Take 325 mg by mouth every other day. **takes in the evening    . Glucosamine 500 MG CAPS Take 500 mg by mouth every evening.     Marland Kitchen HYDROcodone-acetaminophen (NORCO) 10-325 MG per tablet Take 1 tablet by mouth every 6 (six) hours as needed (pain).     Marland Kitchen lansoprazole (PREVACID) 30 MG capsule Take 1 capsule (30 mg total) by mouth 2 (two) times daily before a meal. 180 capsule 3  . levothyroxine (SYNTHROID, LEVOTHROID) 100 MCG tablet Take 100 mcg by mouth daily before breakfast.     . lidocaine (LIDODERM) 5 % Place 1-3 patches onto the skin daily as needed (for pain). Remove & Discard patch within 12 hours or as directed by MD for pain    . losartan (COZAAR) 50 MG tablet Take 50 mg by mouth daily.    . Magnesium 250 MG TABS Take 250 mg by mouth every evening.     . methocarbamol (ROBAXIN) 500 MG tablet Take 500 mg by mouth daily as needed. Muscle spasms    . Multiple Vitamin (MULTIVITAMIN WITH MINERALS) TABS Take 1 tablet by mouth every evening.     . NON FORMULARY Apply 1  application topically 2 (two) times daily. Nail clear from dermatologist    . Omega-3 Fatty Acids (FISH OIL) 1200 MG CAPS Take 1,200 mg by mouth every evening.     . pravastatin (PRAVACHOL) 80 MG tablet Take 80 mg by mouth every evening.     . Probiotic Product (ALIGN PO) Take 1 capsule by mouth every evening.     . propranolol (INDERAL) 20 MG tablet Take 1 tablet (20 mg total) by mouth 2 (two) times daily. 180 tablet 3  . pyridostigmine (MESTINON) 60 MG tablet Take 30 mg by mouth 3 (three) times daily. Take 60 mg by mouth in the AM, take 30 mg by mouth midday and take 30 mg by mouth at bedtime    . SUMAtriptan (IMITREX) 50 MG tablet Take 50 mg by mouth every 2 (two) hours as needed. For migraines     No current facility-administered medications for this visit.     Review of Systems  Constitutional: Negative for chills, diaphoresis, fever, malaise/fatigue and weight loss.  HENT: Negative for congestion, ear discharge, ear pain, hearing loss, nosebleeds, sore throat and tinnitus.   Eyes: Positive for blurred vision and double vision. Negative for pain, discharge and redness.  Respiratory: Positive for shortness of breath. Negative for cough, hemoptysis, sputum production, wheezing and stridor.   Cardiovascular: Negative for palpitations, orthopnea, claudication, leg swelling and PND.  Gastrointestinal: Negative for abdominal pain, blood in stool, constipation, diarrhea, heartburn, melena, nausea and vomiting.  Genitourinary: Negative for dysuria, flank pain, frequency, hematuria and urgency.  Musculoskeletal: Positive for back pain, falls and joint pain. Negative for myalgias and neck pain.  Skin: Negative for itching and rash.  Neurological: Negative for dizziness, tingling, tremors, sensory change, speech change, focal weakness, seizures, loss of consciousness and headaches.  Endo/Heme/Allergies: Negative for environmental allergies and polydipsia. Does not bruise/bleed easily.    Psychiatric/Behavioral: Negative for depression, hallucinations, memory loss, substance abuse and suicidal ideas. The patient is  not nervous/anxious and does not have insomnia.   14 point ROS was done and is otherwise as detailed above or in HPI   PHYSICAL EXAMINATION: ECOG PERFORMANCE STATUS: 1 - Symptomatic but completely ambulatory  Vitals:   12/21/15 1700  BP: (!) 127/54  Pulse: (!) 56  Resp: 18  Temp: 98.1 F (36.7 C)   Filed Weights   12/21/15 1700  Weight: 208 lb 4.8 oz (94.5 kg)    Physical Exam  Constitutional: She is oriented to person, place, and time and well-developed, well-nourished, and in no distress.  HENT:  Head: Normocephalic and atraumatic.  Nose: Nose normal.  Mouth/Throat: Oropharynx is clear and moist. No oropharyngeal exudate.  Eyes: Conjunctivae and EOM are normal. Pupils are equal, round, and reactive to light. Right eye exhibits no discharge. Left eye exhibits no discharge. No scleral icterus.  Neck: Normal range of motion. Neck supple. No tracheal deviation present. No thyromegaly present.  Cardiovascular: Normal rate, regular rhythm and normal heart sounds.  Exam reveals no gallop and no friction rub.   No murmur heard. Pulmonary/Chest: Effort normal and breath sounds normal. She has no wheezes. She has no rales.  Abdominal: Soft. Bowel sounds are normal. She exhibits no distension and no mass. There is no tenderness. There is no rebound and no guarding.  Musculoskeletal: Normal range of motion. She exhibits no edema.  Lymphadenopathy:    She has no cervical adenopathy.  Neurological: She is alert and oriented to person, place, and time. No cranial nerve deficit. Coordination normal.  Uses walker  Skin: Skin is warm and dry. No rash noted.  Psychiatric: Mood, memory, affect and judgment normal.  Nursing note and vitals reviewed.   LABORATORY DATA:  I have reviewed the data as listed Lab Results  Component Value Date   WBC 8.9 05/04/2015    HGB 14.9 05/04/2015   HCT 46.0 05/04/2015   MCV 100.0 05/04/2015   PLT 225 05/04/2015   CMP     Component Value Date/Time   NA 137 05/04/2015 1559   K 4.2 05/04/2015 1559   CL 103 05/04/2015 1559   CO2 24 05/04/2015 1559   GLUCOSE 99 05/04/2015 1559   BUN 17 05/04/2015 1559   CREATININE 1.20 (H) 10/08/2015 0950   CREATININE 1.05 (H) 05/04/2015 1559   CALCIUM 9.6 05/04/2015 1559   PROT 6.8 02/09/2015 1755   ALBUMIN 3.5 02/09/2015 1755   AST 19 02/09/2015 1755   ALT 13 (L) 02/09/2015 1755   ALKPHOS 65 02/09/2015 1755   BILITOT 0.5 02/09/2015 1755   GFRNONAA 56 (L) 02/09/2015 1755   GFRAA >60 02/09/2015 1755         RADIOGRAPHIC STUDIES: I have personally reviewed the radiological images as listed and agreed with the findings in the report. Study Result   CLINICAL DATA:  75 year old female with severe spine pain radiating to the left side with no known injury. Left thoracic pain. Initial encounter. Paraganglioma.  EXAM: MRI THORACIC SPINE WITHOUT AND WITH CONTRAST  TECHNIQUE: Multiplanar and multiecho pulse sequences of the thoracic spine were obtained without and with intravenous contrast.  CONTRAST:  10 mL MultiHance in conjunction with contrast enhanced imaging of the cervical spine reported separately.  COMPARISON:  Chest CT 10/08/2015 and earlier. Cervical spine MRI from today reported separately.  FINDINGS: Cervical spine findings today are reported separately.  Slight dextro convex curvature of the thoracic spine. Relatively normal thoracic kyphosis. Normal thoracic bone marrow signal. No suspicious osseous lesion.  No marrow edema  or acute osseous abnormality identified in the thoracic spine. Widespread thoracic posterior element degeneration, detailed below.  No thoracic spinal cord signal abnormality identified. No abnormal intradural enhancement. The conus medullaris appears normal at T12-L1.  Negative visualized thoracic and upper  abdominal viscera. Posterior paraspinal soft tissues are within normal limits.  The following degenerative changes are noted:  T1-T2: Mild to moderate facet hypertrophy greater on the right. No stenosis.  T2-T3: Trace anterolisthesis. Moderate to severe facet hypertrophy. Moderate to severe ligament flavum hypertrophy. Moderate to severe bilateral T2 foraminal stenosis. No significant spinal stenosis.  T3-T4: Trace anterolisthesis. Moderate to severe facet hypertrophy. Moderate ligament flavum hypertrophy. Mild to moderate left and severe right T3 foraminal stenosis. No significant spinal stenosis.  T4-T5: Trace anterolisthesis. Moderate to severe facet and moderate ligament flavum hypertrophy. Mild left and severe right T4 foraminal stenosis. No significant spinal stenosis.  T5-T6: Moderate to severe facet and ligament flavum hypertrophy greater on the left. Moderate to severe right T5 foraminal stenosis. Mild spinal stenosis, no definite spinal cord mass effect (series 9, image 13).  T6-T7: Moderate to severe facet and ligament flavum hypertrophy. Bulky hypertrophy on the left (series 12, image 11). Spinal stenosis with up to mild spinal cord mass effect. Only mild T6 foraminal stenosis results.  T7-T8: Moderate to severe facet and ligament flavum hypertrophy greater on the right. Mild right T7 foraminal stenosis. No spinal stenosis.  T8-T9: Moderate to severe facet and ligament flavum hypertrophy greater on the right. Moderate left and moderate to severe right T8 foraminal stenosis. Mild spinal stenosis with up to mild spinal cord mass effect (series 9, image 24).  T9-T10: Moderate to severe facet and ligament flavum hypertrophy. Anterior disc osteophyte complex. No foraminal stenosis. Borderline to mild spinal stenosis (series 9, image 28).  T10-T11: Moderate to severe facet and ligament flavum hypertrophy greater on the left. Moderate to severe bilateral  T10 foraminal stenosis greater on the right. Mild spinal stenosis with mild spinal cord mass effect (series 9, image 32).  T11-T12: Mild circumferential disc bulge. Moderate facet and ligament flavum hypertrophy. Moderate bilateral T11 foraminal stenosis. No significant spinal stenosis.  IMPRESSION: 1. No metastatic disease or acute osseous abnormality identified in the thoracic spine. 2. Widespread moderate and severe thoracic posterior element degeneration. Subsequent spinal stenosis with up to mild spinal cord mass effect at the T5-T6, T6-T7, T8-T9, T9-T10, and T10-T11 levels. No associated spinal cord signal abnormality. 3. Associated widespread moderate to severe bilateral thoracic foraminal stenosis. 4. Cervical spine findings today reported separately.   Electronically Signed   By: Genevie Ann M.D.   On: 11/05/2015 16:52   CLINICAL DATA:  Anterior left rib pain extending into the back. EXAM: LEFT RIBS - 2 VIEW COMPARISON:  Bone window images from CT chest 10/08/2015. Left rib x-rays 02/09/2015. FINDINGS: No evidence of acute, subacute or healed fractures. No intrinsic osseous abnormality. Prominent costal cartilage calcification. IMPRESSION: Normal examination. Electronically Signed   By: Evangeline Dakin M.D.   On: 10/15/2015 07:45  ASSESSMENT & PLAN:  Retroperitoneal Malignant paraganglioma R rib pain Ocular MG L thyroid nodule, calcified Thyroidit Macrocytosis Stage II CKD Monoallelic mutation of SDHC gene -- increased lifetime risk of paraganglioma/pheochromocytoma (GIST/RCC) CHEK 2 varian of unknown significance also noted on genetic testing  Urine studies are available from Dr. Wende Neighbors. He did a 24 hour urine for catecholamines and VMA, all studies were WNL.  I will send results to be scanned into EPIC. Collection date was 6//28/2017.   At  her last visit she has a lot of left sided rib pain this has resolved. Radiographic studies are reviewed with the  patient and include MRI and rib series. The results are noted above.  Per records from [facility], she has had a B12 and folate to evaluate her macrocytosis. For now would continue to follow.   RTC 6 months.  She follows with Dr. Nevada Crane for her primary care needs.   Orders Placed This Encounter  Procedures  . Catecholamine+VMA, 24-Hr Urine    Standing Status:   Future    Standing Expiration Date:   12/20/2016    Order Specific Question:   Total urine volume?    Answer:   0   All questions were answered. The patient knows to call the clinic with any problems, questions or concerns.  This document serves as a record of services personally performed by Ancil Linsey, MD. It was created on her behalf by Elmyra Ricks, a trained medical scribe. The creation of this record is based on the scribe's personal observations and the provider's statements to them. This document has been checked and approved by the attending provider.  I have reviewed the above documentation for accuracy and completeness, and I agree with the above.  This note was electronically signed.    Molli Hazard, MD  10/14/2015

## 2015-12-21 NOTE — Progress Notes (Signed)
[name] presents today for injection per MD orders. Flu Vaccine administered IM in left Upper Arm. Administration without incident. Patient tolerated well.

## 2015-12-22 ENCOUNTER — Encounter (HOSPITAL_COMMUNITY): Payer: Self-pay | Admitting: Hematology & Oncology

## 2016-01-12 IMAGING — CR DG RIBS W/ CHEST 3+V*L*
5 series · 5 of 5 positions shown · non-contrast
Comparison: CT chest 01/13/2013.  Chest x-ray 01/01/2012.

CLINICAL DATA: Left-sided anterior rib pain. Shortness of breath.
Fell on sidewalk 1 week ago.

EXAM:
LEFT RIBS AND CHEST - 3+ VIEW

[view not recorded (1 of 5)]
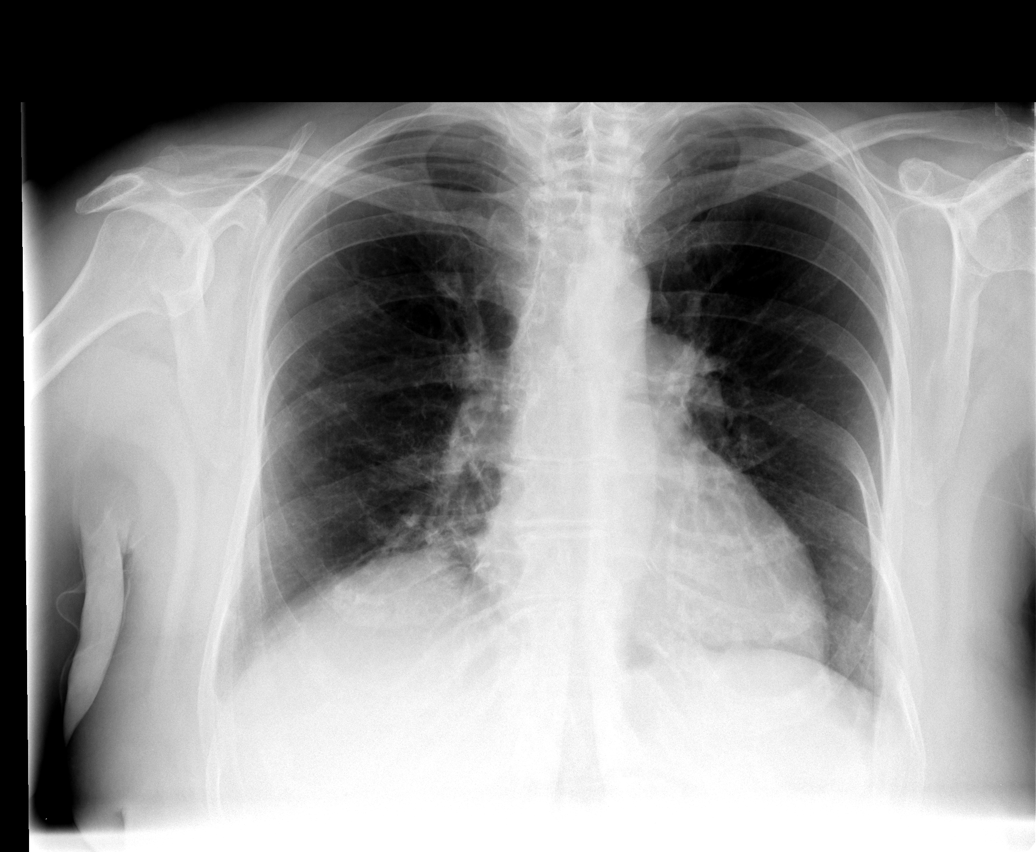

[view not recorded (2 of 5)]
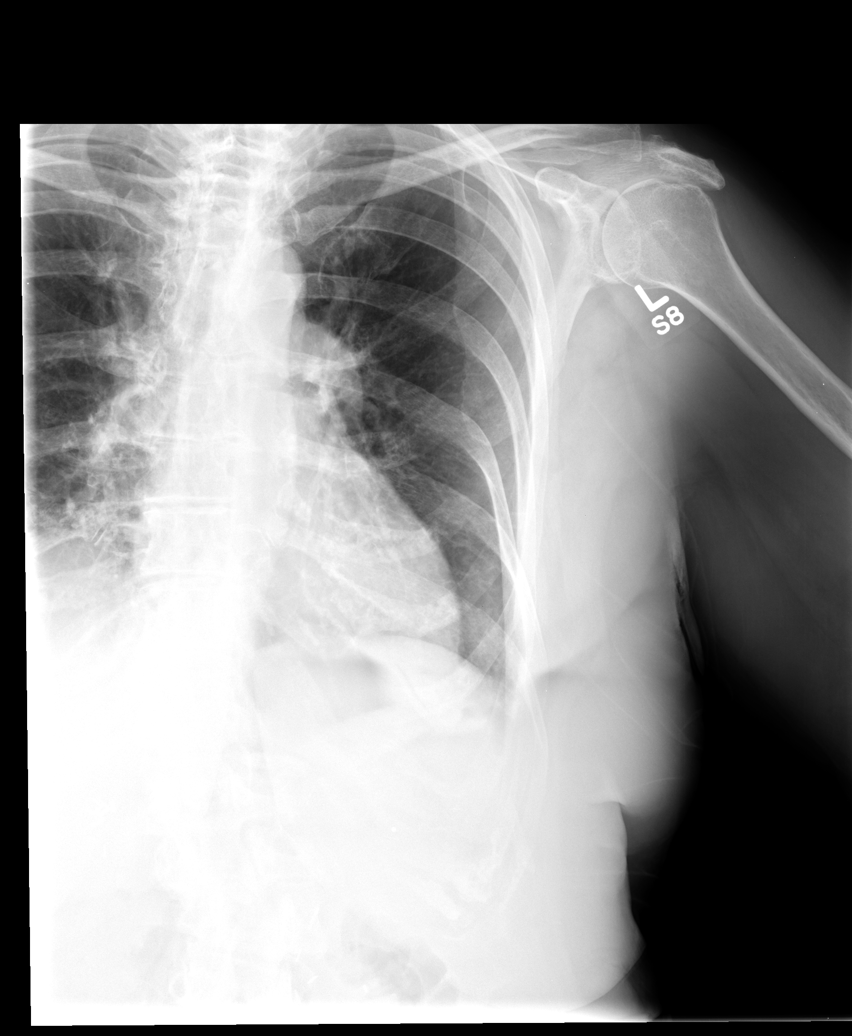

[view not recorded (3 of 5)]
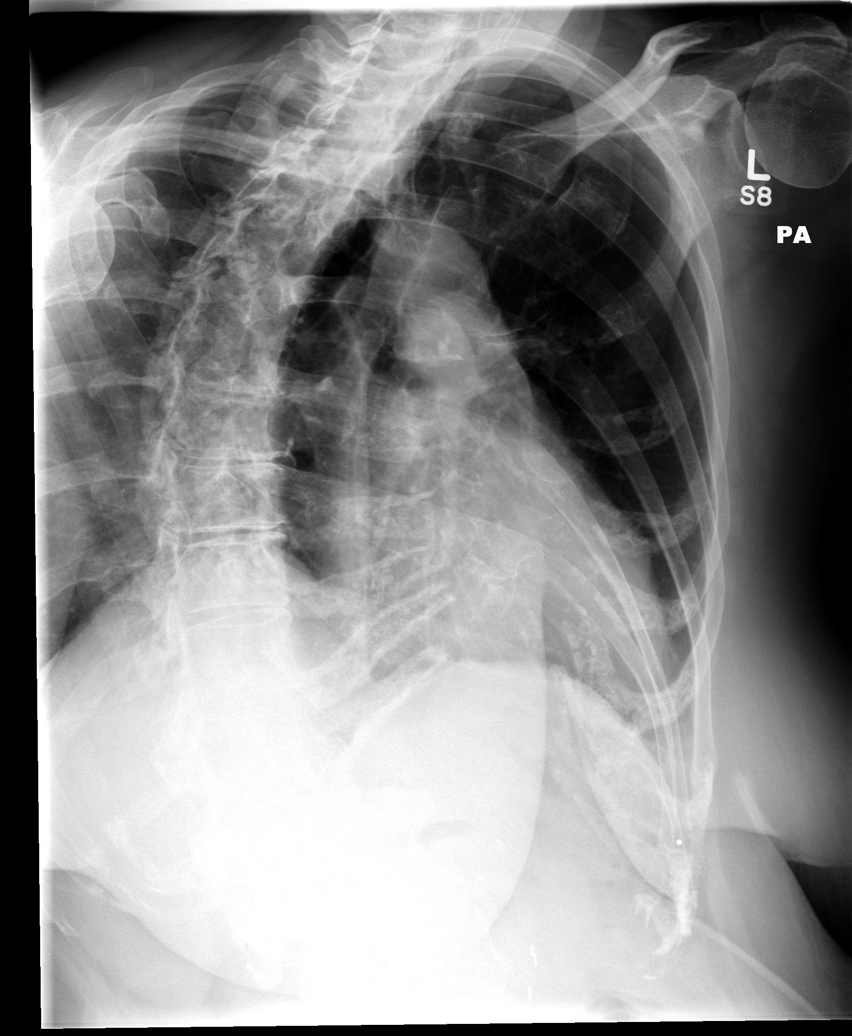

[view not recorded (4 of 5)]
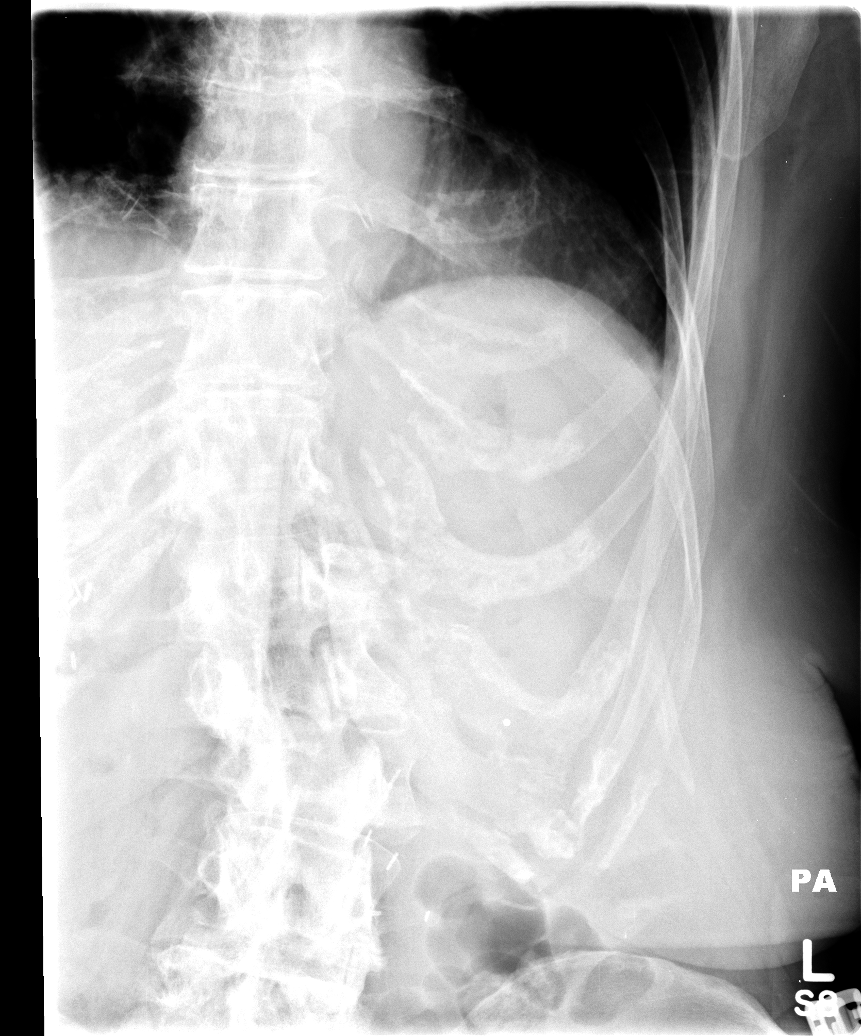

[view not recorded (5 of 5)]
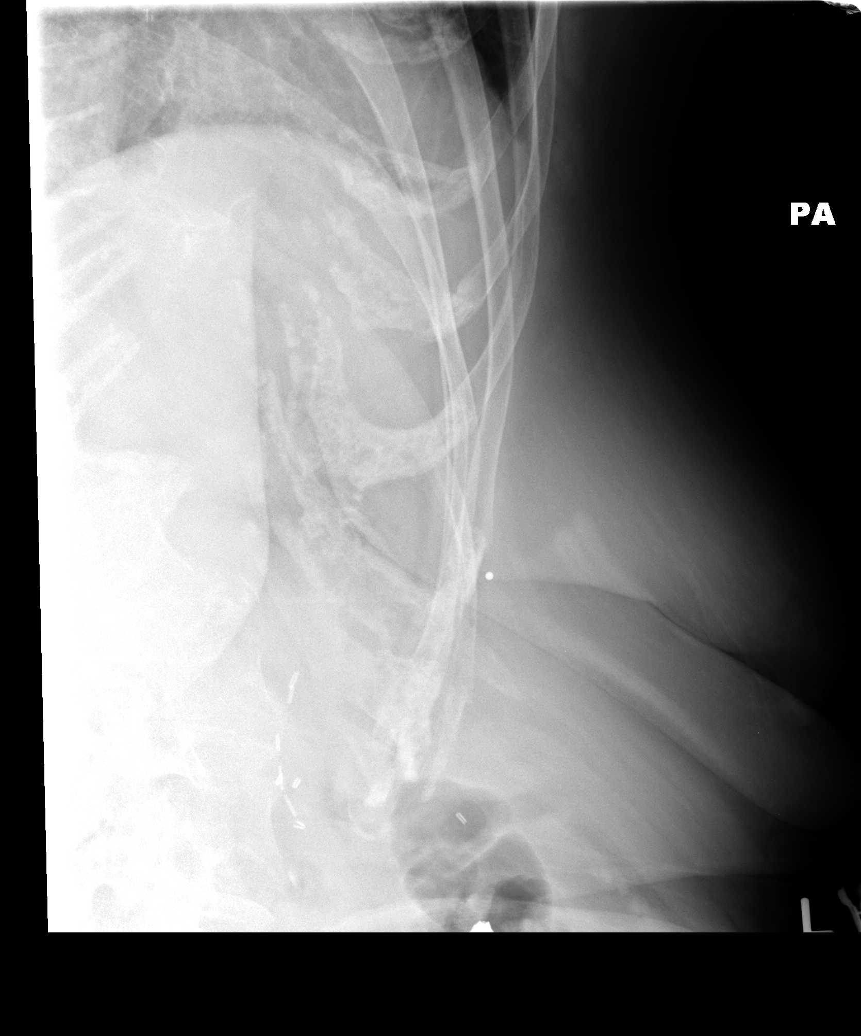

[5 of 5 positions shown; findings below may reference images not displayed]

FINDINGS: Mediastinum hilar structures are normal. Heart size stable.
Pulmonary vascularity normal. No focal pulmonary infiltrates. No
evidence of pneumothorax.

No evidence of displaced rib fracture or focal bony lesion. Surgical
clips in the upper abdomen.
IMPRESSION: No evidence of displaced rib fracture or focal bony lesion . No
pneumothorax.

## 2016-01-25 DIAGNOSIS — Z9181 History of falling: Secondary | ICD-10-CM | POA: Insufficient documentation

## 2016-01-25 DIAGNOSIS — R5383 Other fatigue: Secondary | ICD-10-CM | POA: Diagnosis not present

## 2016-01-25 DIAGNOSIS — G7000 Myasthenia gravis without (acute) exacerbation: Secondary | ICD-10-CM | POA: Diagnosis not present

## 2016-02-17 ENCOUNTER — Telehealth: Payer: Self-pay | Admitting: Cardiovascular Disease

## 2016-02-17 MED ORDER — PROPRANOLOL HCL 20 MG PO TABS
20.0000 mg | ORAL_TABLET | Freq: Three times a day (TID) | ORAL | 3 refills | Status: DC
Start: 2016-02-17 — End: 2016-07-28

## 2016-02-17 NOTE — Addendum Note (Signed)
Addended by: Barbarann Ehlers A on: 02/17/2016 11:23 AM   Modules accepted: Orders

## 2016-02-17 NOTE — Telephone Encounter (Signed)
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

## 2016-02-17 NOTE — Telephone Encounter (Signed)
Pt notified, escribed to [place]

## 2016-02-17 NOTE — Telephone Encounter (Signed)
Pt lvm stating she's been in and out of a-fib and has been treating it at home by herself, she would like to know if she needs to adjust any of her medications or if she needs to be seen [phone]

## 2016-02-17 NOTE — Telephone Encounter (Signed)
That would be fine 

## 2016-02-22 DIAGNOSIS — G7000 Myasthenia gravis without (acute) exacerbation: Secondary | ICD-10-CM | POA: Diagnosis not present

## 2016-03-03 DIAGNOSIS — N390 Urinary tract infection, site not specified: Secondary | ICD-10-CM | POA: Diagnosis not present

## 2016-03-03 DIAGNOSIS — G894 Chronic pain syndrome: Secondary | ICD-10-CM | POA: Diagnosis not present

## 2016-05-01 ENCOUNTER — Other Ambulatory Visit (INDEPENDENT_AMBULATORY_CARE_PROVIDER_SITE_OTHER): Payer: Self-pay | Admitting: Internal Medicine

## 2016-05-01 DIAGNOSIS — K219 Gastro-esophageal reflux disease without esophagitis: Secondary | ICD-10-CM

## 2016-05-09 ENCOUNTER — Telehealth: Payer: Self-pay | Admitting: Cardiovascular Disease

## 2016-05-09 NOTE — Telephone Encounter (Addendum)
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

## 2016-05-09 NOTE — Telephone Encounter (Signed)
Patient states that she is on Eliquis and is bleeding from vagina or bladder. Patient has not taken two doses of Eliquis and it still bleeding. / tg

## 2016-05-11 ENCOUNTER — Telehealth (HOSPITAL_COMMUNITY): Payer: Self-pay

## 2016-05-11 ENCOUNTER — Other Ambulatory Visit: Payer: Self-pay | Admitting: Obstetrics & Gynecology

## 2016-05-11 DIAGNOSIS — Z1272 Encounter for screening for malignant neoplasm of vagina: Secondary | ICD-10-CM | POA: Diagnosis not present

## 2016-05-11 DIAGNOSIS — R319 Hematuria, unspecified: Secondary | ICD-10-CM | POA: Diagnosis not present

## 2016-05-11 NOTE — Telephone Encounter (Signed)
Patient called stating she has had some bleeding this week. She saw her OB/gyn (Dr. Deatra Ina). He told her he could not find a reason for the bleeding in her vagina, rectum or urine. She states she had a hysterectomy in her 36's and has had no bleeding since then. She has a history of malignant paraganglioma. She is not scheduled for follow up until 06/20/16 with labs on 06/12/16. She wants to know if she should be seen earlier than her scheduled appointment

## 2016-05-11 NOTE — Telephone Encounter (Signed)
Absolutely!  She can come in mid-March when there is open slots.  She may feel most comfortable with a female.  Doy Mince 05/11/2016 4:36 PM

## 2016-05-11 NOTE — Telephone Encounter (Signed)
Notified patients daughter that we moved her appointments up to 06/02/16 at 1010 for labs and 11 to see the NP. She verbalized understanding.

## 2016-05-15 DIAGNOSIS — R319 Hematuria, unspecified: Secondary | ICD-10-CM | POA: Diagnosis not present

## 2016-05-15 DIAGNOSIS — R531 Weakness: Secondary | ICD-10-CM | POA: Diagnosis not present

## 2016-05-15 DIAGNOSIS — D509 Iron deficiency anemia, unspecified: Secondary | ICD-10-CM | POA: Diagnosis not present

## 2016-05-15 DIAGNOSIS — R5383 Other fatigue: Secondary | ICD-10-CM | POA: Diagnosis not present

## 2016-05-15 DIAGNOSIS — E039 Hypothyroidism, unspecified: Secondary | ICD-10-CM | POA: Diagnosis not present

## 2016-05-15 DIAGNOSIS — I482 Chronic atrial fibrillation: Secondary | ICD-10-CM | POA: Diagnosis not present

## 2016-05-15 LAB — CYTOLOGY - PAP

## 2016-05-26 ENCOUNTER — Other Ambulatory Visit (HOSPITAL_COMMUNITY): Payer: Self-pay | Admitting: *Deleted

## 2016-05-26 ENCOUNTER — Other Ambulatory Visit (HOSPITAL_COMMUNITY)
Admission: RE | Admit: 2016-05-26 | Discharge: 2016-05-26 | Disposition: A | Discharge status: Home / Self Care | Payer: Medicare Other | Source: Ambulatory Visit | Attending: Oncology | Admitting: Oncology

## 2016-05-26 DIAGNOSIS — C755 Malignant neoplasm of aortic body and other paraganglia: Secondary | ICD-10-CM

## 2016-05-26 DIAGNOSIS — D483 Neoplasm of uncertain behavior of retroperitoneum: Secondary | ICD-10-CM

## 2016-05-30 ENCOUNTER — Telehealth (INDEPENDENT_AMBULATORY_CARE_PROVIDER_SITE_OTHER): Payer: Self-pay | Admitting: *Deleted

## 2016-05-30 NOTE — Telephone Encounter (Signed)
Patient leaves voice mail asking for you.  She has some rectal bleeding   Wants to know when last TCS was and thinks she has an appt coming up.  I have answered her questions and moved her appt up from 3/27 to 3/20 (had cancellation)   She isn't seeing any bleeding now but had GYN check And was no blood noted from vagina or rectum or urine.  You do not need to call her back unless you want or need to or Dr. Laural Golden wants anything done per her before her visit.

## 2016-05-30 NOTE — Telephone Encounter (Signed)
Per Dr. Patient will be evaluated at the time of her appointment.

## 2016-05-31 DIAGNOSIS — E039 Hypothyroidism, unspecified: Secondary | ICD-10-CM | POA: Diagnosis not present

## 2016-05-31 DIAGNOSIS — I10 Essential (primary) hypertension: Secondary | ICD-10-CM | POA: Diagnosis not present

## 2016-06-01 ENCOUNTER — Other Ambulatory Visit (HOSPITAL_COMMUNITY): Payer: Self-pay | Admitting: *Deleted

## 2016-06-01 DIAGNOSIS — D696 Thrombocytopenia, unspecified: Secondary | ICD-10-CM

## 2016-06-02 ENCOUNTER — Encounter (HOSPITAL_COMMUNITY): Payer: Self-pay | Admitting: Adult Health

## 2016-06-02 ENCOUNTER — Encounter (HOSPITAL_COMMUNITY): Payer: Medicare Other | Attending: Adult Health | Admitting: Adult Health

## 2016-06-02 ENCOUNTER — Encounter (HOSPITAL_COMMUNITY): Payer: Medicare Other

## 2016-06-02 VITALS — BP 100/43 | HR 42 | Temp 97.7°F | Resp 16 | Ht 63.0 in | Wt 200.6 lb

## 2016-06-02 DIAGNOSIS — I480 Paroxysmal atrial fibrillation: Secondary | ICD-10-CM | POA: Diagnosis not present

## 2016-06-02 DIAGNOSIS — R51 Headache: Secondary | ICD-10-CM

## 2016-06-02 DIAGNOSIS — Z1589 Genetic susceptibility to other disease: Secondary | ICD-10-CM

## 2016-06-02 DIAGNOSIS — E669 Obesity, unspecified: Secondary | ICD-10-CM | POA: Insufficient documentation

## 2016-06-02 DIAGNOSIS — K589 Irritable bowel syndrome without diarrhea: Secondary | ICD-10-CM | POA: Diagnosis not present

## 2016-06-02 DIAGNOSIS — R519 Headache, unspecified: Secondary | ICD-10-CM

## 2016-06-02 DIAGNOSIS — R0602 Shortness of breath: Secondary | ICD-10-CM

## 2016-06-02 DIAGNOSIS — G8929 Other chronic pain: Secondary | ICD-10-CM | POA: Diagnosis not present

## 2016-06-02 DIAGNOSIS — F418 Other specified anxiety disorders: Secondary | ICD-10-CM | POA: Diagnosis not present

## 2016-06-02 DIAGNOSIS — C755 Malignant neoplasm of aortic body and other paraganglia: Secondary | ICD-10-CM

## 2016-06-02 DIAGNOSIS — K219 Gastro-esophageal reflux disease without esophagitis: Secondary | ICD-10-CM | POA: Insufficient documentation

## 2016-06-02 DIAGNOSIS — Z7901 Long term (current) use of anticoagulants: Secondary | ICD-10-CM | POA: Diagnosis not present

## 2016-06-02 DIAGNOSIS — I517 Cardiomegaly: Secondary | ICD-10-CM | POA: Diagnosis not present

## 2016-06-02 DIAGNOSIS — D3502 Benign neoplasm of left adrenal gland: Secondary | ICD-10-CM | POA: Diagnosis not present

## 2016-06-02 DIAGNOSIS — K625 Hemorrhage of anus and rectum: Secondary | ICD-10-CM

## 2016-06-02 DIAGNOSIS — E782 Mixed hyperlipidemia: Secondary | ICD-10-CM | POA: Diagnosis not present

## 2016-06-02 DIAGNOSIS — D483 Neoplasm of uncertain behavior of retroperitoneum: Secondary | ICD-10-CM | POA: Diagnosis not present

## 2016-06-02 DIAGNOSIS — M549 Dorsalgia, unspecified: Secondary | ICD-10-CM | POA: Insufficient documentation

## 2016-06-02 DIAGNOSIS — I119 Hypertensive heart disease without heart failure: Secondary | ICD-10-CM | POA: Diagnosis not present

## 2016-06-02 DIAGNOSIS — Z85828 Personal history of other malignant neoplasm of skin: Secondary | ICD-10-CM | POA: Diagnosis not present

## 2016-06-02 DIAGNOSIS — E039 Hypothyroidism, unspecified: Secondary | ICD-10-CM | POA: Insufficient documentation

## 2016-06-02 DIAGNOSIS — R079 Chest pain, unspecified: Secondary | ICD-10-CM | POA: Diagnosis not present

## 2016-06-02 DIAGNOSIS — D696 Thrombocytopenia, unspecified: Secondary | ICD-10-CM

## 2016-06-02 DIAGNOSIS — G7000 Myasthenia gravis without (acute) exacerbation: Secondary | ICD-10-CM | POA: Diagnosis not present

## 2016-06-02 DIAGNOSIS — R109 Unspecified abdominal pain: Secondary | ICD-10-CM

## 2016-06-02 LAB — VITAMIN B12: VITAMIN B 12: 830 pg/mL (ref 180–914)

## 2016-06-02 LAB — COMPREHENSIVE METABOLIC PANEL
ALK PHOS: 50 U/L (ref 38–126)
ALT: 11 U/L — ABNORMAL LOW (ref 14–54)
ANION GAP: 7 (ref 5–15)
AST: 18 U/L (ref 15–41)
Albumin: 3.8 g/dL (ref 3.5–5.0)
BILIRUBIN TOTAL: 0.9 mg/dL (ref 0.3–1.2)
BUN: 22 mg/dL — ABNORMAL HIGH (ref 6–20)
CALCIUM: 9.4 mg/dL (ref 8.9–10.3)
CO2: 23 mmol/L (ref 22–32)
Chloride: 102 mmol/L (ref 101–111)
Creatinine, Ser: 1.21 mg/dL — ABNORMAL HIGH (ref 0.44–1.00)
GFR, EST AFRICAN AMERICAN: 49 mL/min — AB (ref >60)
GFR, EST NON AFRICAN AMERICAN: 42 mL/min — AB (ref >60)
Glucose, Bld: 94 mg/dL (ref 65–99)
POTASSIUM: 4.1 mmol/L (ref 3.5–5.1)
Sodium: 132 mmol/L — ABNORMAL LOW (ref 135–145)
TOTAL PROTEIN: 7.1 g/dL (ref 6.5–8.1)

## 2016-06-02 LAB — CBC WITH DIFFERENTIAL/PLATELET
BASOS PCT: 1 %
Basophils Absolute: 0 10*3/uL (ref 0.0–0.1)
Eosinophils Absolute: 0.1 10*3/uL (ref 0.0–0.7)
Eosinophils Relative: 2 %
HCT: 41.8 % (ref 36.0–46.0)
HEMOGLOBIN: 13.9 g/dL (ref 12.0–15.0)
LYMPHS ABS: 2.7 10*3/uL (ref 0.7–4.0)
LYMPHS PCT: 42 %
MCH: 33.1 pg (ref 26.0–34.0)
MCHC: 33.3 g/dL (ref 30.0–36.0)
MCV: 99.5 fL (ref 78.0–100.0)
MONO ABS: 0.6 10*3/uL (ref 0.1–1.0)
MONOS PCT: 10 %
NEUTROS ABS: 2.9 10*3/uL (ref 1.7–7.7)
Neutrophils Relative %: 45 %
Platelets: 177 10*3/uL (ref 150–400)
RBC: 4.2 MIL/uL (ref 3.87–5.11)
RDW: 13.5 % (ref 11.5–15.5)
WBC: 6.4 10*3/uL (ref 4.0–10.5)

## 2016-06-02 LAB — FOLATE: Folate: 20.6 ng/mL (ref >5.9)

## 2016-06-02 NOTE — Patient Instructions (Addendum)
[name] at [facility] Discharge Instructions  RECOMMENDATIONS MADE BY THE CONSULTANT AND ANY TEST RESULTS WILL BE SENT TO YOUR REFERRING PHYSICIAN.  MRI brain and CT Chest, abdomen, pelvis within next 2 weeks  (We will call with results)  Return to see doctor and labs (including 24 hour urine) in 6 months  We are giving you 3 stool cards, please return to clinic.  We are going to call Dr. Jacinta Shoe and see if we can get you in next week to be seen.      Thank you for choosing [place] at [facility] to provide your oncology and hematology care.  To afford each patient quality time with our provider, please arrive at least 15 minutes before your scheduled appointment time.    If you have a lab appointment with the [facility] please come in thru the  Main Entrance and check in at the main information desk  You need to re-schedule your appointment should you arrive 10 or more minutes late.  We strive to give you quality time with our providers, and arriving late affects you and other patients whose appointments are after yours.  Also, if you no show three or more times for appointments you may be dismissed from the clinic at the providers discretion.     Again, thank you for choosing [facility].  Our hope is that these requests will decrease the amount of time that you wait before being seen by our physicians.       _____________________________________________________________  Should you have questions after your visit to [facility], please contact our office at [phone] between the hours of 8:30 a.m. and 4:30 p.m.  Voicemails left after 4:30 p.m. will not be returned until the following business day.  For prescription refill requests, have your pharmacy contact our office.       Resources For Cancer Patients and their Caregivers ? American Cancer Society: Can assist with transportation, wigs,  general needs, runs Look Good Feel Better.        623-345-7284 ? Cancer Care: Provides financial assistance, online support groups, medication/co-pay assistance.  1-800-813-HOPE 551-429-1313) ? [facility] Assists [place] cancer patients and their families through emotional , educational and financial support.  701-151-2567 ? [place] DSS Where to apply for food stamps, Medicaid and utility assistance. 610-722-2002 ? [employer]: Transportation to medical appointments. (916)133-3992 ? Social Security Administration: May apply for disability if have a Stage IV cancer. (406)522-2464 501-245-7546 ? [employer], Disability and Transit Services: Assists with nutrition, care and transit needs. [place] Support Programs: @10RELATIVEDAYS @ > Cancer Support Group  2nd Tuesday of the month 1pm-2pm, Journey Room  > Creative Journey  3rd Tuesday of the month 1130am-1pm, Journey Room  > Look Good Feel Better  1st Wednesday of the month 10am-12 noon, Journey Room (Call [place] to register [phone])

## 2016-06-04 NOTE — Progress Notes (Signed)
[name], [place] [address]   CLINIC:  Medical Oncology/Hematology  PCP:  Wende Neighbors, MD [address] [place] [address] [phone]   REASON FOR VISIT:  Follow-up for retroperitoneal paraganglioma   CURRENT THERAPY: Observation    BRIEF ONCOLOGIC HISTORY:  Oncology History   [place] Gene Abnormality Annual 24 hr urine collection for VMA's and catecholamines     Paraganglioma, malignant ([place])   01/01/2012 Imaging    CT abdomen/pelvis no acute findings identified within the abdomen/pelvis. Slight increase in size of enlarge and enhancing periaortic LN      01/10/2012 PET scan    Intensely hypermetabolic solitary L periaortic LN is concerning for a lymphoproliferative process vs a solitary metastasis. Diffuse intensely hypermetabolic thyroid activity is most consistent with a benign thyroiditis       01/16/2012 Initial Biopsy    IR CT guided biopsy of L para aortic lymph node      01/16/2012 Pathology Results    Low grade neoplasm with neuroendocrine differentiation. DDX includes paraganglioma, pheochromocytoma, and carcinoid      01/30/2012 Imaging    US soft tissue head/neck diffusely enlarged hypervascular nodular thyroid gland compatible with thyroiditis      03/01/2012 Surgery    Exploratory laparotomy, resection of retroperitoneal neuroendocrine tumor 3 x 2 x 2 cm with Dr. Armandina Gemma      03/01/2012 Pathology Results    Paraganglioma with associated fibrosis 2.5 cm, negative margins      04/17/2014 PET scan    There is no evidence for residual or recurrent hypermetabolic tumor or adenopathy. 2. Diffusely increased radiotracer uptake throughout both lobes of the thyroid gland. Correlation with patient's TSH is recommended.       09/10/2015 Imaging    MRI neck with/without contrast at [facility]. No neck mass or LAD. Likely mildly asymmetric lingual tonsil tissue in the L vallecular. 1.3 cm likely calcified L  thyroid nodule.       10/08/2015 Imaging    CT C/A/P No evidence for residual or recurrent tumor or adenopathy.        HISTORY OF PRESENT ILLNESS:  (From Dr. Donald Pore last note on 12/21/15)     INTERVAL HISTORY:  [name] 76 y.o. female presents to cancer center for continued surveillance and follow-up for her history of retroperitoneal paraganglioma; s/p resection in 2013.   [name] has several comorbid conditions, which are followed by other specialists.  She reports that her appetite is poor; she is trying the Atkins Diet to try to better control her cholesterol.  She feels like her energy levels are low, but it does not interfere with her ability to perform ADLs/perform chores at home.    Her newest complaints are stabbing/sharp pain to the left "top of my head." Headaches are different than her normal migraines (managed by Inderal). These headaches have worsened in the past 2 months.  She also has noticed worsening substernal chest pain that radiates to left scapula; she has also noticed increased dyspnea on exertion with the pain.  Denies any chest pain with exercise. Denies diaphoresis.  She is scheduled to see her cardiologist, Dr. [name], in 2 weeks. She has noticed some abdominal pain, mostly cramping with bowel movements at times.  She is scheduled to see her GI specialist next week.  Denies N&V.  She was recently treated for a UTI with Cipro; finished course of antibiotics last week. No dysuria/hematuria since completing antibiotics.  Reports episode of perineal bleeding; it started on 05/08/16 and lasted for 2 days.  She thought she was having vaginal bleeding, so she saw her OB-GYN; there was no evidence of bleeding on that pelvic exam. (of note, she has had hysterectomy). She also noticed "a smear" of blood on the tissue after wiping after a bowel movement.  She holds her Eliquis when these episodes happen and they resolve. She is concerned about why they are happening.    With regards to her paraganglioma, we have been monitoring VMAs and catecholamines with 24-hour urine collection.  There were issues with lab collecting her urine, so she'll have to recollect.   REVIEW OF SYSTEMS:  Review of Systems  Constitutional: Positive for appetite change and fatigue. Negative for chills and fever.  HENT:  Negative.   Eyes:       Ocular myasthenia gravis (followed by neurology)  Respiratory: Positive for shortness of breath.   Cardiovascular: Positive for chest pain.  Gastrointestinal: Positive for abdominal pain (abd cramping). Negative for nausea and vomiting.       ? Rectal bleeding  Endocrine: Negative.   Genitourinary: Positive for vaginal bleeding. Negative for dysuria and hematuria.        Recent UTI; completed Cipro last week. No recurrent UTI symptoms since.   Musculoskeletal: Positive for back pain (chronic back pain ).       Uses cane/walker to ambulate.   Skin: Negative.  Negative for rash.  Neurological: Positive for dizziness (Dizziness at times when going from seated to standing position, "but not too often." ).  Hematological: Negative for adenopathy. Bruises/bleeds easily.  Psychiatric/Behavioral: Negative.      PAST MEDICAL/SURGICAL HISTORY:  Past Medical History:  Diagnosis Date  . Anemia   . Chronic back pain   . Chronic nausea   . Complication of anesthesia    Low blood pressure, heart rate, O2 sat  . Depression with anxiety   . Diverticulosis   . Essential hypertension   . GERD (gastroesophageal reflux disease)   . History of pneumonia   . Hypothyroidism   . IBS (irritable colon syndrome)   . LVH (left ventricular hypertrophy)   . Migraines   . Mixed hyperlipidemia   . Obesity   . Ocular myasthenia gravis ([place])   . Osteoarthritis of knee   . PAF (paroxysmal atrial fibrillation) (HCC)    chads2vasc score is at least 4  . Paraganglioma ([place])    Resection 02/2012 (retroperitoneal)  . Skin cancer    Past Surgical History:   Procedure Laterality Date  . ABDOMINAL HYSTERECTOMY    . ABDOMINAL HYSTERECTOMY    . CARDIAC CATHETERIZATION    . CHOLECYSTECTOMY    . COLONOSCOPY  08/24/2011   Procedure: COLONOSCOPY;  Surgeon: Rogene Houston, MD;  Location: [facility];  Service: Endoscopy;  Laterality: N/A;  1200  . CT guided biopsy  01/16/12  . GIVENS CAPSULE STUDY  01/29/2012   Procedure: GIVENS CAPSULE STUDY;  Surgeon: Rogene Houston, MD;  Location: [facility];  Service: Endoscopy;  Laterality: N/A;  730  . KNEE SURGERY    . LAPAROTOMY  03/01/2012   Procedure: EXPLORATORY LAPAROTOMY;  Surgeon: Earnstine Regal, MD;  Location: [facility] ORS;  Service: General;  Laterality: N/A;  Exploratory Laparotomy ,Resection Retroperitoneal Mass  . NASAL SINUS SURGERY     30 yrs ago  . TOTAL KNEE ARTHROPLASTY Bilateral    2005 and 2011     SOCIAL HISTORY:  Social History  Social History  . Marital status: Widowed    Spouse name: N/A  . Number of children: N/A  . Years of education: N/A   Occupational History  . Not on file.   Social History Main Topics  . Smoking status: Never Smoker  . Smokeless tobacco: Never Used  . Alcohol use No  . Drug use: No  . Sexual activity: Not on file   Other Topics Concern  . Not on file   Social History Narrative   Lives in [place] alone.  Retired Quarry manager.    FAMILY HISTORY:  Family History  Problem Relation Age of Onset  . Inflammatory bowel disease Maternal Grandmother     CURRENT MEDICATIONS:  Outpatient Encounter Prescriptions as of 06/02/2016  Medication Sig Note  . ALPRAZolam (XANAX) 1 MG tablet Take 1 mg by mouth at bedtime as needed for sleep. can be repeated x 1 if needed. insomnia   . amitriptyline (ELAVIL) 25 MG tablet Take 25 mg by mouth at bedtime.   Marland Kitchen apixaban (ELIQUIS) 5 MG TABS tablet Take 1 tablet (5 mg total) by mouth 2 (two) times daily.   . Calcium Carbonate-Vit D-Min (CALTRATE 600+D PLUS MINERALS PO) Take 0.5 tablets by mouth daily.    .  Coenzyme Q10 (CO Q 10) 10 MG CAPS Take 10 mg by mouth every evening.    . Cyanocobalamin (B-12) 3000 MCG SUBL Place 1 tablet under the tongue every evening.   . diclofenac sodium (VOLTAREN) 1 % GEL Apply 4 g topically 4 (four) times daily as needed (for pain).  02/09/2015: Received from: External Pharmacy  . docusate sodium (COLACE) 100 MG capsule Take 100 mg by mouth every evening.    . Glucosamine 500 MG CAPS Take 500 mg by mouth every evening.    Marland Kitchen HYDROcodone-acetaminophen (NORCO) 10-325 MG per tablet Take 1 tablet by mouth every 6 (six) hours as needed (pain).    Marland Kitchen lansoprazole (PREVACID) 30 MG capsule TAKE (1) CAPSULE BY MOUTH TWICE DAILY.   Marland Kitchen levothyroxine (SYNTHROID, LEVOTHROID) 100 MCG tablet Take 100 mcg by mouth daily before breakfast.  02/09/2015: Received from: External Pharmacy  . lidocaine (LIDODERM) 5 % Place 1-3 patches onto the skin daily as needed (for pain). Remove & Discard patch within 12 hours or as directed by MD for pain   . losartan (COZAAR) 50 MG tablet Take 50 mg by mouth daily.   . Magnesium 250 MG TABS Take 250 mg by mouth every evening.    . Multiple Vitamin (MULTIVITAMIN WITH MINERALS) TABS Take 1 tablet by mouth every evening.    . Omega-3 Fatty Acids (FISH OIL) 1200 MG CAPS Take 1,200 mg by mouth every evening.    . pravastatin (PRAVACHOL) 80 MG tablet Take 40 mg by mouth every evening.    . Probiotic Product (ALIGN PO) Take 1 capsule by mouth every evening.    . propranolol (INDERAL) 20 MG tablet Take 1 tablet (20 mg total) by mouth 3 (three) times daily.   Marland Kitchen pyridostigmine (MESTINON) 60 MG tablet Take 30 mg by mouth 3 (three) times daily. Take 60 mg by mouth in the AM, take 30 mg by mouth midday and take 30 mg by mouth at bedtime   . SUMAtriptan (IMITREX) 50 MG tablet Take 50 mg by mouth every 2 (two) hours as needed. For migraines 09/26/2011: [facility] for migranes  . methocarbamol (ROBAXIN) 500 MG tablet Take 500 mg by mouth daily as needed. Muscle spasms  05/25/2014: Received from: [facility] Received Sig:   . [DISCONTINUED] ferrous sulfate 325 (65 FE) MG EC tablet Take 325 mg by mouth every other day. **takes in the evening   . [DISCONTINUED] NON FORMULARY Apply 1 application topically 2 (two) times daily. Nail clear from dermatologist    No facility-administered encounter medications on file as of 06/02/2016.     ALLERGIES:  Allergies  Allergen Reactions  . Codeine Itching    Can not take Codeine unless in cough syrup Can not take Codeine unless in cough syrup  . Oxycodone-Acetaminophen     Severe nausea Severe nausea  . Tylox [Oxycodone-Acetaminophen]     Severe nausea  . Ampicillin Rash  . Avocado Diarrhea and Nausea And Vomiting  . Biaxin [Clarithromycin] Rash  . Demeclocycline Nausea And Vomiting  . Doxycycline Monohydrate Nausea And Vomiting  . Macrodantin [Nitrofurantoin Macrocrystal] Nausea And Vomiting  . Other Rash  . Petrolatum-Zinc Oxide Rash  . Tetracyclines & Related Nausea And Vomiting     PHYSICAL EXAM:  ECOG Performance status: 1-2 - Symptomatic; requires occasional assistance.   Vitals:   06/02/16 1104  BP: (!) 100/43  Pulse: (!) 42  Resp: 16  Temp: 97.7 F (36.5 C)   Filed Weights   06/02/16 1104  Weight: 200 lb 9.6 oz (91 kg)    Physical Exam  Constitutional: She is oriented to person, place, and time and well-developed, well-nourished, and in no distress.  HENT:  Head: Normocephalic.  Mouth/Throat: Oropharynx is clear and moist. No oropharyngeal exudate.  Eyes: Conjunctivae are normal. Pupils are equal, round, and reactive to light. No scleral icterus.  Neck: Normal range of motion. Neck supple.  Cardiovascular: Regular rhythm and normal heart sounds.  Bradycardia present.   Pulmonary/Chest: Effort normal and breath sounds normal. No respiratory distress. She has no wheezes.  Abdominal: Soft. Bowel sounds are normal. There is no tenderness.  Musculoskeletal: She exhibits edema (1+ BLE/ankle  edema ).  Lymphadenopathy:    She has no cervical adenopathy.       Right: No supraclavicular adenopathy present.       Left: No supraclavicular adenopathy present.  Neurological: She is alert and oriented to person, place, and time. No cranial nerve deficit.  Skin: Skin is warm and dry. No rash noted.  Psychiatric: Mood, memory, affect and judgment normal.  Nursing note and vitals reviewed.    LABORATORY DATA:  I have reviewed the labs as listed.  CBC    Component Value Date/Time   WBC 6.4 06/02/2016 1023   RBC 4.20 06/02/2016 1023   HGB 13.9 06/02/2016 1023   HCT 41.8 06/02/2016 1023   PLT 177 06/02/2016 1023   MCV 99.5 06/02/2016 1023   MCH 33.1 06/02/2016 1023   MCHC 33.3 06/02/2016 1023   RDW 13.5 06/02/2016 1023   LYMPHSABS 2.7 06/02/2016 1023   MONOABS 0.6 06/02/2016 1023   EOSABS 0.1 06/02/2016 1023   BASOSABS 0.0 06/02/2016 1023   CMP Latest Ref Rng & Units 06/02/2016 10/08/2015 05/04/2015  Glucose 65 - 99 mg/dL 94 - 99  BUN 6 - 20 mg/dL 22(H) - 17  Creatinine 0.44 - 1.00 mg/dL 1.21(H) 1.20(H) 1.05(H)  Sodium 135 - 145 mmol/L 132(L) - 137  Potassium 3.5 - 5.1 mmol/L 4.1 - 4.2  Chloride 101 - 111 mmol/L 102 - 103  CO2 22 - 32 mmol/L 23 - 24  Calcium 8.9 - 10.3 mg/dL 9.4 - 9.6  Total Protein 6.5 - 8.1 g/dL 7.1 - -  Total Bilirubin 0.3 -  1.2 mg/dL 0.9 - -  Alkaline Phos 38 - 126 U/L 50 - -  AST 15 - 41 U/L 18 - -  ALT 14 - 54 U/L 11(L) - -    PENDING LABS:    DIAGNOSTIC IMAGING:  CT chest/abd/pelvis: 10/08/15      PATHOLOGY:  Retroperitoneal mass resection: 03/01/12     ASSESSMENT & PLAN:   Left retroperitoneal paraganglioma/pheochromocytoma:  -SDHC gene abnormality.  -s/p surgical resection in 02/2012.  Last CT chest/abd/pelvis in 09/2015 was negative for residual or recurrent tumor or adenopathy.  -24-hour urine to assess VMAs and catecholamines pending.  -Return to cancer center in 6 months for continued surveillance.   Headaches:  -New,  sharp/stabbing pain headaches x 2 months. I have low clinical suspicion that her headaches are secondary to paraganglioma, but I will obtain MRI brain for further evaluation.   Ocular myasthenia gravis:  -Continue follow-up with neurology.    Recent perineal bleeding (vaginal vs rectal bleeding) & abdominal pain:  -Unclear etiology. She was evaluated by her OB-GYN in 04/2016 and there was no evidence of bleeding on that exam.  -She is on Eliquis. Hemoglobin normal at 13.9 g/dL.  -Will send patient home with occult stool cards x 3 to assess for blood in stool.  -CT abd/pelvis ordered for further evaluation as well, given history of paraganglioma.   Substernal chest pain & increased shortness of breath:  -Chest pain radiates to left chest and left scapula. -Concerned about ischemic chest pain with possible cardiac etiology. She has follow-up with cardiologist in 2 weeks. I will have our nurses call Dr. [name] with cardiology to see if she can be seen mid-next week for earlier evaluation.   -Will obtain CT chest for evaluation given history of paraganglioma.      Dispo:  -CT chest/abd/pelvis and MRI brain within the next 2 weeks.  -Follow-up in 6 months for continued follow-up.    All questions were answered to patient's stated satisfaction. Encouraged patient to call with any new concerns or questions before her next visit to the cancer center and we can certain see her sooner, if needed.    Plan of care discussed with Dr. Irene Limbo, who agrees with the above aforementioned.    Orders placed this encounter:  Orders Placed This Encounter  Procedures  . CT ABDOMEN PELVIS W CONTRAST  . CT CHEST W CONTRAST  . MR Brain W Wo Contrast  . Occult blood card to lab, stool  . Occult blood card to lab, stool  . Occult blood card to lab, stool      Mike Craze, NP [place] [phone]

## 2016-06-05 ENCOUNTER — Other Ambulatory Visit (HOSPITAL_COMMUNITY): Payer: Self-pay

## 2016-06-05 DIAGNOSIS — K625 Hemorrhage of anus and rectum: Secondary | ICD-10-CM

## 2016-06-05 DIAGNOSIS — N182 Chronic kidney disease, stage 2 (mild): Secondary | ICD-10-CM | POA: Diagnosis not present

## 2016-06-05 DIAGNOSIS — D483 Neoplasm of uncertain behavior of retroperitoneum: Secondary | ICD-10-CM | POA: Diagnosis not present

## 2016-06-05 DIAGNOSIS — I119 Hypertensive heart disease without heart failure: Secondary | ICD-10-CM | POA: Diagnosis not present

## 2016-06-05 DIAGNOSIS — Z6832 Body mass index (BMI) 32.0-32.9, adult: Secondary | ICD-10-CM | POA: Diagnosis not present

## 2016-06-05 DIAGNOSIS — G8929 Other chronic pain: Secondary | ICD-10-CM | POA: Diagnosis not present

## 2016-06-05 DIAGNOSIS — C755 Malignant neoplasm of aortic body and other paraganglia: Secondary | ICD-10-CM

## 2016-06-05 DIAGNOSIS — G7000 Myasthenia gravis without (acute) exacerbation: Secondary | ICD-10-CM | POA: Diagnosis not present

## 2016-06-05 DIAGNOSIS — I482 Chronic atrial fibrillation: Secondary | ICD-10-CM | POA: Diagnosis not present

## 2016-06-05 DIAGNOSIS — E039 Hypothyroidism, unspecified: Secondary | ICD-10-CM | POA: Diagnosis not present

## 2016-06-05 DIAGNOSIS — G894 Chronic pain syndrome: Secondary | ICD-10-CM | POA: Diagnosis not present

## 2016-06-05 DIAGNOSIS — F418 Other specified anxiety disorders: Secondary | ICD-10-CM | POA: Diagnosis not present

## 2016-06-05 DIAGNOSIS — Z0000 Encounter for general adult medical examination without abnormal findings: Secondary | ICD-10-CM | POA: Diagnosis not present

## 2016-06-05 DIAGNOSIS — E782 Mixed hyperlipidemia: Secondary | ICD-10-CM | POA: Diagnosis not present

## 2016-06-05 DIAGNOSIS — M549 Dorsalgia, unspecified: Secondary | ICD-10-CM | POA: Diagnosis not present

## 2016-06-05 DIAGNOSIS — G7001 Myasthenia gravis with (acute) exacerbation: Secondary | ICD-10-CM | POA: Diagnosis not present

## 2016-06-05 DIAGNOSIS — I10 Essential (primary) hypertension: Secondary | ICD-10-CM | POA: Diagnosis not present

## 2016-06-05 LAB — OCCULT BLOOD X 1 CARD TO LAB, STOOL
FECAL OCCULT BLD: NEGATIVE
Fecal Occult Bld: NEGATIVE
Fecal Occult Bld: NEGATIVE

## 2016-06-06 ENCOUNTER — Ambulatory Visit (INDEPENDENT_AMBULATORY_CARE_PROVIDER_SITE_OTHER): Payer: Medicare Other | Admitting: Internal Medicine

## 2016-06-06 ENCOUNTER — Encounter (INDEPENDENT_AMBULATORY_CARE_PROVIDER_SITE_OTHER): Payer: Self-pay | Admitting: Internal Medicine

## 2016-06-06 VITALS — BP 130/80 | HR 60 | Temp 97.6°F | Resp 18 | Ht 63.0 in | Wt 198.0 lb

## 2016-06-06 DIAGNOSIS — K625 Hemorrhage of anus and rectum: Secondary | ICD-10-CM

## 2016-06-06 DIAGNOSIS — K219 Gastro-esophageal reflux disease without esophagitis: Secondary | ICD-10-CM | POA: Diagnosis not present

## 2016-06-06 NOTE — Progress Notes (Signed)
Presenting complaint;  Recent bout of rectal bleeding.  Subjective:  Patient is 76 year old Caucasian female was a for scheduled visit. She was last seen on 08/18/2015. She presents with history of rectal bleeding which occurred on 05/08/2016. She noted bright red blood with normal bowel movement and then she had multiple episodes during the day and night. She did not experience abdominal pain nausea vomiting or lightheadedness. She was seen by her gynecologist 3 days later and had pelvic and rectal exam and no blood was found. She has not had any more rectal bleeding. She had blood work by Dr. Edwyna Ready call and her hemoglobin was 14.3. He has history of IBS and occasionally notices abdominal cramping with bowel movement but it is transient. She is prone to constipation. She controlled this problem by eating prunes and fruit and she also takes Colace and takes MiraLAX when necessary. Her appetite is good and she has not lost any weight recently. She has chronic back pain secondary to spinal stenosis. She takes pain pills usually 3 times a day. She also complains of chest pain and shortness of breath with weightbearing exercises. She has an appointment to see Dr. [name]. She has history of paroxysmal atrial fibrillation and is on apixaban. She has not had spells of A. fib since she has been on higher dose of Inderal. She also gives history of ocular myasthenia gravis. She goes to the gym 3-4 times a week.  Last colonoscopy was in June 2013 revealing sigmoid colon diverticulosis. She had 2 small polyps removed and these are hyperplastic.   Current Medications: Outpatient Encounter Prescriptions as of 06/06/2016  Medication Sig  . ALPRAZolam (XANAX) 1 MG tablet Take 1 mg by mouth at bedtime as needed for sleep. can be repeated x 1 if needed. insomnia  . amitriptyline (ELAVIL) 25 MG tablet Take 25 mg by mouth at bedtime.  Marland Kitchen apixaban (ELIQUIS) 5 MG TABS tablet Take 1 tablet (5 mg total) by mouth 2  (two) times daily.  . Calcium Carbonate-Vit D-Min (CALTRATE 600+D PLUS MINERALS PO) Take 600 tablets by mouth daily.   . Coenzyme Q10 (CO Q 10) 10 MG CAPS Take 10 mg by mouth every evening.   . Cyanocobalamin (B-12) 3000 MCG SUBL Place 1 tablet under the tongue every evening.  . diclofenac sodium (VOLTAREN) 1 % GEL Apply 4 g topically 4 (four) times daily as needed (for pain).   Marland Kitchen docusate sodium (COLACE) 100 MG capsule Take 100 mg by mouth every evening.   . Glucosamine 500 MG CAPS Take 500 mg by mouth every evening.   Marland Kitchen HYDROcodone-acetaminophen (NORCO) 10-325 MG per tablet Take 1 tablet by mouth every 6 (six) hours as needed (pain).   Marland Kitchen lansoprazole (PREVACID) 30 MG capsule TAKE (1) CAPSULE BY MOUTH TWICE DAILY.  Marland Kitchen levothyroxine (SYNTHROID, LEVOTHROID) 100 MCG tablet Take 100 mcg by mouth daily before breakfast.   . lidocaine (LIDODERM) 5 % Place 1-3 patches onto the skin daily as needed (for pain). Remove & Discard patch within 12 hours or as directed by MD for pain  . losartan (COZAAR) 50 MG tablet Take 50 mg by mouth daily.  . Magnesium 250 MG TABS Take 250 mg by mouth every evening.   . methocarbamol (ROBAXIN) 500 MG tablet Take 500 mg by mouth daily as needed. Muscle spasms  . Multiple Vitamin (MULTIVITAMIN WITH MINERALS) TABS Take 1 tablet by mouth every evening.   . Omega-3 Fatty Acids (FISH OIL) 1200 MG CAPS Take 1,200 mg by mouth  every evening.   . pravastatin (PRAVACHOL) 80 MG tablet Take 40 mg by mouth every evening.   . Probiotic Product (ALIGN PO) Take 1 capsule by mouth every evening.   . propranolol (INDERAL) 20 MG tablet Take 1 tablet (20 mg total) by mouth 3 (three) times daily.  Marland Kitchen pyridostigmine (MESTINON) 60 MG tablet Take 30 mg by mouth 3 (three) times daily. Take 60 mg by mouth in the AM, take 30 mg by mouth midday and take 30 mg by mouth at bedtime  . SUMAtriptan (IMITREX) 50 MG tablet Take 50 mg by mouth every 2 (two) hours as needed. For migraines   No  facility-administered encounter medications on file as of 06/06/2016.      Objective: Blood pressure 130/80, pulse 60, temperature 97.6 F (36.4 C), temperature source Oral, resp. rate 18, height 5\' 3"  (1.6 m), weight 198 lb (89.8 kg). Patient is alert and in no acute distress. Conjunctiva is pink. Sclera is nonicteric Oropharyngeal mucosa is normal. No neck masses or thyromegaly noted. Cardiac exam with regular rhythm normal S1 and S2. No murmur or gallop noted. Lungs are clear to auscultation. Abdomen is full. Bowel sounds are normal. On palpation abdomen is soft. Mild tenderness noted in right low quadrant but no guarding rebound or masses noted. No organomegaly either. No LE edema or clubbing noted.  Labs/studies Results:  Lab data from 05/15/2016 WBC 8.3, H&H 14.3 and 44.1 and platelet count 180 K. Serum iron 113 TIBC 284 and saturation 40% Serum ferritin 112.  Assessment:  #1. Recent bout of rectal bleeding in the setting of chronic anticoagulation would appear to be either hemorrhoidal or diverticular bleed. Last colonoscopy was in June 2013 revealing sigmoid diverticulosis and 2 hyperplastic polyps. It is reassuring to know that her hemoglobin is normal. Will delay evaluation while she I undergoig studies for follow-up of paraganglioma and she also has upcoming appointment with Dr. [name] for evaluation of chest pain and dyspnea induced by weightbearing exercise #2. Chronic GERD. She is doing well with double dose PPI. #3. 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 She is scheduled to undergo multiple studies to make sure she is in remission.   Plan:  Patient will call when imaging studiies and cardiac evaluation completed admission time will consider diagnostic colonoscopy. Patient device to report to ER if she has another episode of gross rectal bleeding since she is anticoagulated. She will try decreasing lansoprazole  dose to once daily. If she tolerates dose reduction will change prescription at next refill. Office visit in one year.

## 2016-06-06 NOTE — Patient Instructions (Signed)
Call if you have another episode of bleeding. Please call office when you've had all the planned tests and cardiac evaluation. Decrease Prevacid dose to once daily. Take it 30 minutes before breakfast. If you experience breakthrough  symptoms in the evening you can go back to taking second dose.

## 2016-06-09 LAB — CATECHOLAMINE+VMA, 24-HR URINE
Dopamine, Rand Ur: 96 ug/L
Epinephrine, Rand Ur: 2 ug/L
Norepinephrine, Rand Ur: 31 ug/L
VMA, URINE: 1.9 mg/L

## 2016-06-12 ENCOUNTER — Other Ambulatory Visit (HOSPITAL_COMMUNITY): Payer: Medicare Other

## 2016-06-13 ENCOUNTER — Ambulatory Visit (INDEPENDENT_AMBULATORY_CARE_PROVIDER_SITE_OTHER): Payer: Medicare Other | Admitting: Internal Medicine

## 2016-06-15 ENCOUNTER — Encounter (HOSPITAL_COMMUNITY): Payer: Self-pay

## 2016-06-15 ENCOUNTER — Ambulatory Visit (HOSPITAL_COMMUNITY)
Admission: RE | Admit: 2016-06-15 | Discharge: 2016-06-15 | Disposition: A | Discharge status: Home / Self Care | Payer: Medicare Other | Source: Ambulatory Visit | Attending: Adult Health | Admitting: Adult Health

## 2016-06-15 DIAGNOSIS — R079 Chest pain, unspecified: Secondary | ICD-10-CM | POA: Diagnosis not present

## 2016-06-15 DIAGNOSIS — C480 Malignant neoplasm of retroperitoneum: Secondary | ICD-10-CM | POA: Diagnosis not present

## 2016-06-15 DIAGNOSIS — R0602 Shortness of breath: Secondary | ICD-10-CM | POA: Diagnosis not present

## 2016-06-15 DIAGNOSIS — Z1589 Genetic susceptibility to other disease: Secondary | ICD-10-CM | POA: Insufficient documentation

## 2016-06-15 DIAGNOSIS — R519 Headache, unspecified: Secondary | ICD-10-CM

## 2016-06-15 DIAGNOSIS — R51 Headache: Secondary | ICD-10-CM | POA: Insufficient documentation

## 2016-06-15 DIAGNOSIS — Z90710 Acquired absence of both cervix and uterus: Secondary | ICD-10-CM | POA: Insufficient documentation

## 2016-06-15 DIAGNOSIS — C755 Malignant neoplasm of aortic body and other paraganglia: Secondary | ICD-10-CM | POA: Insufficient documentation

## 2016-06-15 DIAGNOSIS — R109 Unspecified abdominal pain: Secondary | ICD-10-CM | POA: Insufficient documentation

## 2016-06-15 DIAGNOSIS — Z9049 Acquired absence of other specified parts of digestive tract: Secondary | ICD-10-CM | POA: Diagnosis not present

## 2016-06-15 DIAGNOSIS — R918 Other nonspecific abnormal finding of lung field: Secondary | ICD-10-CM | POA: Diagnosis not present

## 2016-06-15 DIAGNOSIS — K5730 Diverticulosis of large intestine without perforation or abscess without bleeding: Secondary | ICD-10-CM | POA: Diagnosis not present

## 2016-06-15 HISTORY — DX: Disorder of kidney and ureter, unspecified: N28.9

## 2016-06-15 MED ORDER — GADOBENATE DIMEGLUMINE 529 MG/ML IV SOLN
9.0000 mL | Freq: Once | INTRAVENOUS | Status: AC | PRN
  Administered 2016-06-15: 9 mL via INTRAVENOUS

## 2016-06-15 MED ORDER — IOPAMIDOL (ISOVUE-300) INJECTION 61%
80.0000 mL | Freq: Once | INTRAVENOUS | Status: AC | PRN
Start: 2016-06-15 — End: 2016-06-15
  Administered 2016-06-15: 80 mL via INTRAVENOUS

## 2016-06-16 ENCOUNTER — Ambulatory Visit (INDEPENDENT_AMBULATORY_CARE_PROVIDER_SITE_OTHER): Payer: Medicare Other | Admitting: Cardiovascular Disease

## 2016-06-16 ENCOUNTER — Encounter: Payer: Self-pay | Admitting: Cardiovascular Disease

## 2016-06-16 VITALS — BP 136/74 | HR 55 | Ht 63.0 in | Wt 202.0 lb

## 2016-06-16 DIAGNOSIS — I10 Essential (primary) hypertension: Secondary | ICD-10-CM

## 2016-06-16 DIAGNOSIS — E782 Mixed hyperlipidemia: Secondary | ICD-10-CM | POA: Diagnosis not present

## 2016-06-16 DIAGNOSIS — R5383 Other fatigue: Secondary | ICD-10-CM

## 2016-06-16 DIAGNOSIS — R0609 Other forms of dyspnea: Secondary | ICD-10-CM

## 2016-06-16 DIAGNOSIS — I480 Paroxysmal atrial fibrillation: Secondary | ICD-10-CM | POA: Diagnosis not present

## 2016-06-16 DIAGNOSIS — R072 Precordial pain: Secondary | ICD-10-CM

## 2016-06-16 NOTE — Patient Instructions (Signed)
Your physician recommends that you schedule a follow-up appointment in: 6 weeks   Your physician has requested that you have an echocardiogram. Echocardiography is a painless test that uses sound waves to create images of your heart. It provides your doctor with information about the size and shape of your heart and how well your heart's chambers and valves are working. This procedure takes approximately one hour. There are no restrictions for this procedure.   Your physician has requested that you have a lexiscan myoview. For further information please visit [email or web address]. Please follow instruction sheet, as given.    Your physician recommends that you continue on your current medications as directed. Please refer to the Current Medication list given to you today.    If you need a refill on your cardiac medications before your next appointment, please call your pharmacy.     Thank you for choosing [facility] !

## 2016-06-16 NOTE — Progress Notes (Signed)
SUBJECTIVE: The patient presents for follow-up of paroxysmal atrial fibrillation. She was previously evaluated by EP in March 2016 and antiarrhythmic therapy was not recommended given her isolated instance of atrial fibrillation.  ECG performed in the office today which I ordered and personally interpreted demonstrates sinus bradycardia with a left anterior fascicular block and nonspecific T-wave abnormalities, 50 bpm.  She is here with her daughter. The patient complains of chest pain, exertional dyspnea, and "severe fatigue" for the past 3 weeks. She denies palpitations. She said she can ride a bicycle for 30 minutes without shortness of breath but if she is weightbearing and walking from room to room, she experiences exertional dyspnea. She has chest pains even lying down in bed.  She underwent CT of the chest, abdomen, and pelvis yesterday and I reviewed these studies. There is no evidence of metastatic disease. Head CT showed chronic microvascular ischemia. She has a history of retroperitoneal paraganglioma.  Nuclear stress test 04/08/14: Low risk study with soft tissue attenuation artifact versus a small region of ischemia.   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   Review of Systems: As per "subjective", otherwise negative.  Allergies  Allergen Reactions  . Codeine Itching    Can not take Codeine unless in cough syrup Can not take Codeine unless in cough syrup  . Oxycodone-Acetaminophen     Severe nausea Severe nausea  . Tylox [Oxycodone-Acetaminophen]     Severe nausea  . Ampicillin Rash  . Avocado Diarrhea and Nausea And Vomiting  . Biaxin [Clarithromycin] Rash  . Demeclocycline Nausea And Vomiting  . Doxycycline Monohydrate Nausea And Vomiting  . Macrodantin [Nitrofurantoin Macrocrystal] Nausea And Vomiting  . Other Rash  . Petrolatum-Zinc Oxide Rash  . Tetracyclines & Related Nausea And  Vomiting    Current Outpatient Prescriptions  Medication Sig Dispense Refill  . ALPRAZolam (XANAX) 1 MG tablet Take 1 mg by mouth at bedtime as needed for sleep. can be repeated x 1 if needed. insomnia    . amitriptyline (ELAVIL) 25 MG tablet Take 25 mg by mouth at bedtime.    Marland Kitchen apixaban (ELIQUIS) 5 MG TABS tablet Take 1 tablet (5 mg total) by mouth 2 (two) times daily. 60 tablet 12  . Calcium Carbonate-Vit D-Min (CALTRATE 600+D PLUS MINERALS PO) Take 600 tablets by mouth daily.     . Coenzyme Q10 (CO Q 10) 10 MG CAPS Take 10 mg by mouth every evening.     . Cyanocobalamin (B-12) 3000 MCG SUBL Place 1 tablet under the tongue every evening.    . diclofenac sodium (VOLTAREN) 1 % GEL Apply 4 g topically 4 (four) times daily as needed (for pain).     Marland Kitchen docusate sodium (COLACE) 100 MG capsule Take 100 mg by mouth every evening.     . Glucosamine 500 MG CAPS Take 500 mg by mouth every evening.     Marland Kitchen HYDROcodone-acetaminophen (NORCO) 10-325 MG per tablet Take 1 tablet by mouth every 6 (six) hours as needed (pain).     Marland Kitchen lansoprazole (PREVACID) 30 MG capsule TAKE (1) CAPSULE BY MOUTH TWICE DAILY. 60 capsule 11  . levothyroxine (SYNTHROID, LEVOTHROID) 100 MCG tablet Take 100 mcg by mouth daily before breakfast.     . lidocaine (LIDODERM) 5 % Place 1-3 patches onto the skin daily as needed (for pain). Remove & Discard patch within 12 hours or as directed  by MD for pain    . losartan (COZAAR) 50 MG tablet Take 50 mg by mouth daily.    . Magnesium 250 MG TABS Take 250 mg by mouth every evening.     . methocarbamol (ROBAXIN) 500 MG tablet Take 500 mg by mouth daily as needed. Muscle spasms    . Multiple Vitamin (MULTIVITAMIN WITH MINERALS) TABS Take 1 tablet by mouth every evening.     . Omega-3 Fatty Acids (FISH OIL) 1200 MG CAPS Take 1,200 mg by mouth every evening.     . pravastatin (PRAVACHOL) 80 MG tablet Take 40 mg by mouth every evening.     . Probiotic Product (ALIGN PO) Take 1 capsule by mouth  every evening.     . propranolol (INDERAL) 20 MG tablet Take 1 tablet (20 mg total) by mouth 3 (three) times daily. 270 tablet 3  . pyridostigmine (MESTINON) 60 MG tablet Take 30 mg by mouth 3 (three) times daily. Take 60 mg by mouth in the AM, take 30 mg by mouth midday and take 30 mg by mouth at bedtime    . SUMAtriptan (IMITREX) 50 MG tablet Take 50 mg by mouth every 2 (two) hours as needed. For migraines     No current facility-administered medications for this visit.     Past Medical History:  Diagnosis Date  . Anemia   . Chronic back pain   . Chronic nausea   . Complication of anesthesia    Low blood pressure, heart rate, O2 sat  . Depression with anxiety   . Diverticulosis   . Essential hypertension   . GERD (gastroesophageal reflux disease)   . History of pneumonia   . Hypothyroidism   . IBS (irritable colon syndrome)   . LVH (left ventricular hypertrophy)   . Migraines   . Mixed hyperlipidemia   . Obesity   . Ocular myasthenia gravis ([place])   . Osteoarthritis of knee   . PAF (paroxysmal atrial fibrillation) (HCC)    chads2vasc score is at least 4  . Paraganglioma ([place])    Resection 02/2012 (retroperitoneal)  . Renal insufficiency   . Skin cancer     Past Surgical History:  Procedure Laterality Date  . ABDOMINAL HYSTERECTOMY    . ABDOMINAL HYSTERECTOMY    . CARDIAC CATHETERIZATION    . CHOLECYSTECTOMY    . COLONOSCOPY  08/24/2011   Procedure: COLONOSCOPY;  Surgeon: Rogene Houston, MD;  Location: [facility];  Service: Endoscopy;  Laterality: N/A;  1200  . CT guided biopsy  01/16/12  . GIVENS CAPSULE STUDY  01/29/2012   Procedure: GIVENS CAPSULE STUDY;  Surgeon: Rogene Houston, MD;  Location: [facility];  Service: Endoscopy;  Laterality: N/A;  730  . KNEE SURGERY    . LAPAROTOMY  03/01/2012   Procedure: EXPLORATORY LAPAROTOMY;  Surgeon: Earnstine Regal, MD;  Location: [facility];  Service: General;  Laterality: N/A;  Exploratory Laparotomy ,Resection  Retroperitoneal Mass  . NASAL SINUS SURGERY     30 yrs ago  . TOTAL KNEE ARTHROPLASTY Bilateral    2005 and 2011    Social History   Social History  . Marital status: Widowed    Spouse name: N/A  . Number of children: N/A  . Years of education: N/A   Occupational History  . Not on file.   Social History Main Topics  . Smoking status: Never Smoker  . Smokeless tobacco: Never Used  . Alcohol use No  . Drug use:  No  . Sexual activity: Not on file   Other Topics Concern  . Not on file   Social History Narrative   Lives in [place] alone.  Retired Quarry manager.     Vitals:   06/16/16 1102  BP: 136/74  Pulse: (!) 55  SpO2: 98%  Weight: 202 lb (91.6 kg)  Height: 5\' 3"  (1.6 m)    PHYSICAL EXAM General: NAD HEENT: Normal. Neck: No JVD, no thyromegaly. Lungs: Clear to auscultation bilaterally with normal respiratory effort. CV: Nondisplaced PMI.  Bradycardic, regular rhythm, normal S1/S2, no S3/S4, no murmur. No pretibial or periankle edema.  No carotid bruit.   Abdomen: Soft, nontender, no distention.  Neurologic: Alert and oriented.  Psych: Normal affect. Skin: Normal. Musculoskeletal: No gross deformities.    ECG: Most recent ECG reviewed.      ASSESSMENT AND PLAN: 1. Chest pain and shortness of breath with fatigue: She underwent a low risk nuclear stress test in January 2016 with either soft tissue attenuation artifact versus a small region of ischemia. I will repeat a nuclear stress test to see if there has been interval progression. I will also obtain an echocardiogram to evaluate cardiac structure and function.  2. Paroxysmal atrial fibrillation: Symptomatically stable on propranolol and Eliquis. No changes to therapy.  3. Essential hypertension: Controlled on present therapy. No changes.  4. Hyperlipidemia: Continue statin therapy.   Disposition: Follow-up in 6 weeks.   Kate Sable, M.D., F.A.C.C.

## 2016-06-20 ENCOUNTER — Encounter (HOSPITAL_COMMUNITY): Payer: Medicare Other | Attending: Adult Health | Admitting: Oncology

## 2016-06-20 ENCOUNTER — Encounter (HOSPITAL_COMMUNITY): Payer: Self-pay | Admitting: Oncology

## 2016-06-20 VITALS — BP 137/43 | HR 49 | Temp 98.2°F | Resp 16 | Ht 63.0 in | Wt 203.0 lb

## 2016-06-20 DIAGNOSIS — D3502 Benign neoplasm of left adrenal gland: Secondary | ICD-10-CM | POA: Insufficient documentation

## 2016-06-20 DIAGNOSIS — R51 Headache: Secondary | ICD-10-CM | POA: Diagnosis not present

## 2016-06-20 DIAGNOSIS — E039 Hypothyroidism, unspecified: Secondary | ICD-10-CM | POA: Insufficient documentation

## 2016-06-20 DIAGNOSIS — C755 Malignant neoplasm of aortic body and other paraganglia: Secondary | ICD-10-CM | POA: Diagnosis not present

## 2016-06-20 DIAGNOSIS — I480 Paroxysmal atrial fibrillation: Secondary | ICD-10-CM | POA: Insufficient documentation

## 2016-06-20 DIAGNOSIS — F418 Other specified anxiety disorders: Secondary | ICD-10-CM | POA: Insufficient documentation

## 2016-06-20 DIAGNOSIS — Z7901 Long term (current) use of anticoagulants: Secondary | ICD-10-CM | POA: Insufficient documentation

## 2016-06-20 DIAGNOSIS — K589 Irritable bowel syndrome without diarrhea: Secondary | ICD-10-CM | POA: Insufficient documentation

## 2016-06-20 DIAGNOSIS — M549 Dorsalgia, unspecified: Secondary | ICD-10-CM | POA: Insufficient documentation

## 2016-06-20 DIAGNOSIS — Z85828 Personal history of other malignant neoplasm of skin: Secondary | ICD-10-CM | POA: Insufficient documentation

## 2016-06-20 DIAGNOSIS — G7000 Myasthenia gravis without (acute) exacerbation: Secondary | ICD-10-CM | POA: Insufficient documentation

## 2016-06-20 DIAGNOSIS — G8929 Other chronic pain: Secondary | ICD-10-CM | POA: Insufficient documentation

## 2016-06-20 DIAGNOSIS — I517 Cardiomegaly: Secondary | ICD-10-CM | POA: Insufficient documentation

## 2016-06-20 DIAGNOSIS — D483 Neoplasm of uncertain behavior of retroperitoneum: Secondary | ICD-10-CM | POA: Insufficient documentation

## 2016-06-20 DIAGNOSIS — R0602 Shortness of breath: Secondary | ICD-10-CM | POA: Diagnosis not present

## 2016-06-20 DIAGNOSIS — I119 Hypertensive heart disease without heart failure: Secondary | ICD-10-CM | POA: Insufficient documentation

## 2016-06-20 DIAGNOSIS — K219 Gastro-esophageal reflux disease without esophagitis: Secondary | ICD-10-CM | POA: Insufficient documentation

## 2016-06-20 DIAGNOSIS — E669 Obesity, unspecified: Secondary | ICD-10-CM | POA: Insufficient documentation

## 2016-06-20 DIAGNOSIS — E782 Mixed hyperlipidemia: Secondary | ICD-10-CM | POA: Insufficient documentation

## 2016-06-20 NOTE — Assessment & Plan Note (Addendum)
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  Oncology history is updated.  She is here to review most recent imaging: CT CAP and MRI brain.  I personally reviewed and went over radiographic studies with the patient.  The results are noted within this dictation.   Imaging is negative for any recurrence of disease.  Labs from March 2018: CBC diff, CMET, anemia panel, urine catecholamines, and stool cards.  I personally reviewed and went over laboratory results with the patient.  The results are noted within this dictation.  Labs were unimpressive for any significant abnormality.  No role for labs in 6 months.  She has seen cardiology for her chest pain and ongoing testing [place] scheduled with follow-up.  She will follow-up with neurologist regarding her headaches and microvascular ischemia noted on MRI of brain.  She is on anticoagulation for her A-fib and reports compliance with this medication.  Problem list reviewed with patient and edited accordingly.  Medications are reviewed with the patient and edited accordingly.  Return in 6 months for follow-up.  More than 50% of the time spent with the patient was utilized for counseling and coordination of care.

## 2016-06-20 NOTE — Patient Instructions (Addendum)
[name] at [facility] Discharge Instructions  RECOMMENDATIONS MADE BY THE CONSULTANT AND ANY TEST RESULTS WILL BE SENT TO YOUR REFERRING PHYSICIAN.  You were seen today by Kirby Crigler PA-C. Return in 6 months for follow up.   Thank you for choosing [facility] at [facility] to provide your oncology and hematology care.  To afford each patient quality time with our provider, please arrive at least 15 minutes before your scheduled appointment time.    If you have a lab appointment with the [facility] please come in thru the  Main Entrance and check in at the main information desk  You need to re-schedule your appointment should you arrive 10 or more minutes late.  We strive to give you quality time with our providers, and arriving late affects you and other patients whose appointments are after yours.  Also, if you no show three or more times for appointments you may be dismissed from the clinic at the providers discretion.     Again, thank you for choosing [facility].  Our hope is that these requests will decrease the amount of time that you wait before being seen by our physicians.       _____________________________________________________________  Should you have questions after your visit to [facility], please contact our office at [phone] between the hours of 8:30 a.m. and 4:30 p.m.  Voicemails left after 4:30 p.m. will not be returned until the following business day.  For prescription refill requests, have your pharmacy contact our office.       Resources For Cancer Patients and their Caregivers ? American Cancer Society: Can assist with transportation, wigs, general needs, runs Look Good Feel Better.        (437) 401-2300 ? Cancer Care: Provides financial assistance, online support groups, medication/co-pay assistance.  1-800-813-HOPE (941) 288-5019) ? [place] Assists  [place] cancer patients and their families through emotional , educational and financial support.  360-271-1161 ? [place] DSS Where to apply for food stamps, Medicaid and utility assistance. 302-414-2566 ? [facility]: Transportation to medical appointments. 571-886-5453 ? Social Security Administration: May apply for disability if have a Stage IV cancer. 631-676-7306 919-702-0307 ? [employer], Disability and Transit Services: Assists with nutrition, care and transit needs. [place] Support Programs: @10RELATIVEDAYS @ > Cancer Support Group  2nd Tuesday of the month 1pm-2pm, Journey Room  > Creative Journey  3rd Tuesday of the month 1130am-1pm, Journey Room  > Look Good Feel Better  1st Wednesday of the month 10am-12 noon, Journey Room (Call [facility] to register [phone])

## 2016-06-20 NOTE — Progress Notes (Signed)
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   Oncology History   [place] Gene Abnormality Annual 24 hr urine collection for VMA's and catecholamines     Paraganglioma, malignant ([place])   01/01/2012 Imaging    CT abdomen/pelvis no acute findings identified within the abdomen/pelvis. Slight increase in size of enlarge and enhancing periaortic LN      01/10/2012 PET scan    Intensely hypermetabolic solitary L periaortic LN is concerning for a lymphoproliferative process vs a solitary metastasis. Diffuse intensely hypermetabolic thyroid activity is most consistent with a benign thyroiditis       01/16/2012 Initial Biopsy    IR CT guided biopsy of L para aortic lymph node      01/16/2012 Pathology Results    Low grade neoplasm with neuroendocrine differentiation. DDX includes paraganglioma, pheochromocytoma, and carcinoid      01/30/2012 Imaging    US soft tissue head/neck diffusely enlarged hypervascular nodular thyroid gland compatible with thyroiditis      03/01/2012 Surgery    Exploratory laparotomy, resection of retroperitoneal neuroendocrine tumor 3 x 2 x 2 cm with Dr. Armandina Gemma      03/01/2012 Pathology Results    Paraganglioma with associated fibrosis 2.5 cm, negative margins      04/17/2014 PET scan    There is no evidence for residual or recurrent hypermetabolic tumor or adenopathy. 2. Diffusely increased radiotracer uptake throughout both lobes of the thyroid gland. Correlation with patient's TSH is recommended.       09/10/2015 Imaging    MRI neck with/without contrast at [facility]. No neck mass or LAD. Likely mildly asymmetric lingual tonsil tissue in the L vallecular. 1.3 cm likely calcified L thyroid nodule.       10/08/2015 Imaging    CT  C/A/P No evidence for residual or recurrent tumor or adenopathy.      06/15/2016 Imaging    MRI brain- No acute abnormality.  Negative for metastatic disease  Several small subcortical white matter hyperintensities have developed since 2013. This is most likely related to chronic microvascular ischemia.      06/15/2016 Imaging    CT CAP-   1. No evidence of thoracic metastasis. 1. No evidence of metastatic disease in the abdomen pelvis. 2. No periaortic adenopathy. 3. Sigmoid diverticulosis.  No diverticulitis.       She reports a right parietal headache that is stabbing in nature.  She notes that it is different from her typical headaches.  It comes and goes.  Is not currently present.  She complained about this on her last encounter which led to MRI of her brain.  MRI of her brain is negative except for some chronic ischemic microvascular changes.  She started established with a neurologist I have encouraged her to follow-up with her neurologist about this.  No evidence of metastatic disease.  She also notes chest pain that exacerbated with exertion.  It is associated with shortness of breath.  She notes that the pain is substernal and radiates to the apical region into her left shoulder blade.  She notes that rest improves the pain.  She has been seen by cardiology and she is set up for additional testing moving forward.  She is here to review recent CT and MRI imaging.  All imaging is negative  for any recurrence of disease.  Review of Systems  Constitutional: Negative.  Negative for chills, fever and weight loss.  HENT: Negative.   Eyes: Negative.   Respiratory: Negative.  Negative for cough.   Cardiovascular: Positive for chest pain.  Gastrointestinal: Negative.  Negative for blood in stool, constipation, diarrhea, melena, nausea and vomiting.  Genitourinary: Negative.   Musculoskeletal: Negative.   Skin: Negative.   Neurological: Positive for headaches. Negative for weakness.   Endo/Heme/Allergies: Negative.   Psychiatric/Behavioral: Negative.     Past Medical History:  Diagnosis Date  . Anemia   . Chronic back pain   . Chronic nausea   . Complication of anesthesia    Low blood pressure, heart rate, O2 sat  . Depression with anxiety   . Diverticulosis   . Essential hypertension   . GERD (gastroesophageal reflux disease)   . History of pneumonia   . Hypothyroidism   . IBS (irritable colon syndrome)   . LVH (left ventricular hypertrophy)   . Migraines   . Mixed hyperlipidemia   . Obesity   . Ocular myasthenia gravis ([place])   . Osteoarthritis of knee   . PAF (paroxysmal atrial fibrillation) (HCC)    chads2vasc score is at least 4  . Paraganglioma ([place])    Resection 02/2012 (retroperitoneal)  . Paraganglioma, malignant ([place]) 11/06/2015  . Renal insufficiency   . Skin cancer     Past Surgical History:  Procedure Laterality Date  . ABDOMINAL HYSTERECTOMY    . ABDOMINAL HYSTERECTOMY    . CARDIAC CATHETERIZATION    . CHOLECYSTECTOMY    . COLONOSCOPY  08/24/2011   Procedure: COLONOSCOPY;  Surgeon: Rogene Houston, MD;  [facility];  Service: Endoscopy;  Laterality: N/A;  1200  . CT guided biopsy  01/16/12  . GIVENS CAPSULE STUDY  01/29/2012   Procedure: GIVENS CAPSULE STUDY;  Surgeon: Rogene Houston, MD;  Location: [facility];  Service: Endoscopy;  Laterality: N/A;  730  . KNEE SURGERY    . LAPAROTOMY  03/01/2012   Procedure: EXPLORATORY LAPAROTOMY;  Surgeon: Earnstine Regal, MD;  Location: [facility] OR[facility];  Service: General;  Laterality: N/A;  Exploratory Laparotomy ,Resection Retroperitoneal Mass  . NASAL SINUS SURGERY     30 yrs ago  . TOTAL KNEE ARTHROPLASTY Bilateral    2005 and 2011    Family History  Problem Relation Age of Onset  . Inflammatory bowel disease Maternal Grandmother     Social History   Social History  . Marital status: Widowed    Spouse name: N/A  . Number of children: N/A  . Years of education: N/A   Social  History Main Topics  . Smoking status: Never Smoker  . Smokeless tobacco: Never Used  . Alcohol use No  . Drug use: No  . Sexual activity: Not Asked   Other Topics Concern  . None   Social History Narrative   Lives in [place] alone.  Retired Quarry manager.     PHYSICAL EXAMINATION  ECOG PERFORMANCE STATUS: 1 - Symptomatic but completely ambulatory  Vitals:   06/20/16 1348  BP: (!) 137/43  Pulse: (!) 49  Resp: 16  Temp: 98.2 F (36.8 C)    GENERAL:alert, no distress, well nourished, well developed, comfortable, cooperative, obese, smiling and accompanied by daughter who is also the patient's HCPOA, patient on cell phone upon my entrance into the exam room x 2 minutes. SKIN: skin color, texture, turgor are normal, no rashes or significant lesions  HEAD: Normocephalic, No masses, lesions, tenderness or abnormalities EYES: normal, EOMI, Conjunctiva are pink and non-injected EARS: External ears normal OROPHARYNX:lips, buccal mucosa, and tongue normal and mucous membranes are moist  NECK: supple, trachea midline LYMPH:  not examined BREAST:not examined LUNGS: clear to auscultation  HEART: irregularly irregular ABDOMEN:abdomen soft and obese BACK: Back symmetric, no curvature. EXTREMITIES:less then 2 second capillary refill, no joint deformities, effusion, or inflammation, no skin discoloration, no cyanosis  NEURO: alert & oriented x 3 with fluent speech, no focal motor/sensory deficits, gait normal   LABORATORY DATA: CBC    Component Value Date/Time   WBC 6.4 06/02/2016 1023   RBC 4.20 06/02/2016 1023   HGB 13.9 06/02/2016 1023   HCT 41.8 06/02/2016 1023   PLT 177 06/02/2016 1023   MCV 99.5 06/02/2016 1023   MCH 33.1 06/02/2016 1023   MCHC 33.3 06/02/2016 1023   RDW 13.5 06/02/2016 1023   LYMPHSABS 2.7 06/02/2016 1023   MONOABS 0.6 06/02/2016 1023   EOSABS 0.1 06/02/2016 1023   BASOSABS 0.0 06/02/2016 1023      Chemistry      Component Value Date/Time   NA  132 (L) 06/02/2016 1023   K 4.1 06/02/2016 1023   CL 102 06/02/2016 1023   CO2 23 06/02/2016 1023   BUN 22 (H) 06/02/2016 1023   CREATININE 1.21 (H) 06/02/2016 1023   CREATININE 1.05 (H) 05/04/2015 1559      Component Value Date/Time   CALCIUM 9.4 06/02/2016 1023   ALKPHOS 50 06/02/2016 1023   AST 18 06/02/2016 1023   ALT 11 (L) 06/02/2016 1023   BILITOT 0.9 06/02/2016 1023        PENDING LABS:   RADIOGRAPHIC STUDIES:  Ct Chest W Contrast  Result Date: 06/15/2016 CLINICAL DATA:  Back and neck pain. New headaches. LEFT scapular pain. Paraganglioma resection 2013. Hysterectomy and cholecystectomy. EXAM: CT CHEST, ABDOMEN, AND PELVIS WITH CONTRAST TECHNIQUE: Multidetector CT imaging of the chest, abdomen and pelvis was performed following the standard protocol during bolus administration of intravenous contrast. CONTRAST:  13m ISOVUE-300 IOPAMIDOL (ISOVUE-300) INJECTION 61% COMPARISON:  Chest CT 10/08/2015, PET-CT 04/17/2014, chest MRI 11/05/2015 FINDINGS: CT CHEST FINDINGS Cardiovascular: No significant vascular findings. Normal heart size. No pericardial effusion. Mediastinum/Nodes: No axillary or supraclavicular lymphadenopathy. No mediastinal hilar lymphadenopathy. Esophagus normal. Stable calcification within the LEFT lobe of thyroid gland. Lungs/Pleura: Thickening along the RIGHT oblique fissure (image 68, series 4) similar comparison exam. Mild bronchiectasis in the lung bases. No suspicious nodularity. Musculoskeletal: No aggressive osseous lesion. CT ABDOMEN AND PELVIS FINDINGS Hepatobiliary: No focal hepatic lesion. Postcholecystectomy. No biliary dilatation. Pancreas: Pancreas is normal. No ductal dilatation. No pancreatic inflammation. Periampullary duodenum diverticulum again noted measuring 2.4 cm. Spleen: Normal spleen Adrenals/urinary tract: Adrenal glands and kidneys are normal. The ureters and bladder normal. Stomach/Bowel: Stomach, small-bowel cecum normal. There is  diverticular of the sigmoid colon descending colon without acute inflammation. Rectum appears normal Vascular/Lymphatic: Abdominal aorta is normal caliber. There is no retroperitoneal or periportal lymphadenopathy. No pelvic lymphadenopathy. Surgical clips along the retroperitoneum LEFT of the aorta. No evidence of local recurrence. Reproductive: Post hysterectomy Other: No free fluid. Musculoskeletal: No aggressive osseous lesion. IMPRESSION: Chest Impression: 1. No evidence of thoracic metastasis. Abdomen / Pelvis Impression: 1. No evidence of metastatic disease in the abdomen pelvis. 2. No periaortic adenopathy. 3. Sigmoid diverticulosis.  No diverticulitis. Electronically Signed   By: SSuzy BouchardM.D.   On: 06/15/2016 10:16   Mr BJeri CosWGNContrast  Result  Date: 06/15/2016 CLINICAL DATA:  Malignant paraganglioma retroperitoneum. Worsening headaches. EXAM: MRI HEAD WITHOUT AND WITH CONTRAST TECHNIQUE: Multiplanar, multiecho pulse sequences of the brain and surrounding structures were obtained without and with intravenous contrast. CONTRAST:  62m MULTIHANCE GADOBENATE DIMEGLUMINE 529 MG/ML IV SOLN COMPARISON:  MRI head 02/17/2012 FINDINGS: Brain: Ventricle size normal.  Cerebral volume normal for age. Negative for acute infarct. Several small hyperintensities in the deep white matter bilaterally have developed since the prior MRI 2013. Brainstem and cerebellum normal. Negative for hemorrhage mass or edema Normal enhancement postcontrast administration. No enhancing mass lesion. Vascular: Normal arterial flow voids.  Normal venous enhancement. Skull and upper cervical spine: Negative Sinuses/Orbits: Negative Other: None IMPRESSION: No acute abnormality.  Negative for metastatic disease Several small subcortical white matter hyperintensities have developed since 2013. This is most likely related to chronic microvascular ischemia. Electronically Signed   By: CFranchot GalloM.D.   On: 06/15/2016 10:02   Ct  Abdomen Pelvis W Contrast  Result Date: 06/15/2016 CLINICAL DATA:  Back and neck pain. New headaches. LEFT scapular pain. Paraganglioma resection 2013. Hysterectomy and cholecystectomy. EXAM: CT CHEST, ABDOMEN, AND PELVIS WITH CONTRAST TECHNIQUE: Multidetector CT imaging of the chest, abdomen and pelvis was performed following the standard protocol during bolus administration of intravenous contrast. CONTRAST:  834mISOVUE-300 IOPAMIDOL (ISOVUE-300) INJECTION 61% COMPARISON:  Chest CT 10/08/2015, PET-CT 04/17/2014, chest MRI 11/05/2015 FINDINGS: CT CHEST FINDINGS Cardiovascular: No significant vascular findings. Normal heart size. No pericardial effusion. Mediastinum/Nodes: No axillary or supraclavicular lymphadenopathy. No mediastinal hilar lymphadenopathy. Esophagus normal. Stable calcification within the LEFT lobe of thyroid gland. Lungs/Pleura: Thickening along the RIGHT oblique fissure (image 68, series 4) similar comparison exam. Mild bronchiectasis in the lung bases. No suspicious nodularity. Musculoskeletal: No aggressive osseous lesion. CT ABDOMEN AND PELVIS FINDINGS Hepatobiliary: No focal hepatic lesion. Postcholecystectomy. No biliary dilatation. Pancreas: Pancreas is normal. No ductal dilatation. No pancreatic inflammation. Periampullary duodenum diverticulum again noted measuring 2.4 cm. Spleen: Normal spleen Adrenals/urinary tract: Adrenal glands and kidneys are normal. The ureters and bladder normal. Stomach/Bowel: Stomach, small-bowel cecum normal. There is diverticular of the sigmoid colon descending colon without acute inflammation. Rectum appears normal Vascular/Lymphatic: Abdominal aorta is normal caliber. There is no retroperitoneal or periportal lymphadenopathy. No pelvic lymphadenopathy. Surgical clips along the retroperitoneum LEFT of the aorta. No evidence of local recurrence. Reproductive: Post hysterectomy Other: No free fluid. Musculoskeletal: No aggressive osseous lesion. IMPRESSION:  Chest Impression: 1. No evidence of thoracic metastasis. Abdomen / Pelvis Impression: 1. No evidence of metastatic disease in the abdomen pelvis. 2. No periaortic adenopathy. 3. Sigmoid diverticulosis.  No diverticulitis. Electronically Signed   By: StSuzy Bouchard.D.   On: 06/15/2016 10:16     PATHOLOGY:    ASSESSMENT AND PLAN:  Paraganglioma, malignant ([place]elRetroperitoneal malignant paraganglioma in the setting of monoallelic mutation of SDHC gene (which increases the lifetime risk of paraganglioma/pheochromocytoma (GIST/RCC)) and CHEK2 variant of unknown significance.  Oncology history is updated.  She is here to review most recent imaging: CT CAP and MRI brain.  I personally reviewed and went over radiographic studies with the patient.  The results are noted within this dictation.   Imaging is negative for any recurrence of disease.  Labs from March 2018: CBC diff, CMET, anemia panel, urine catecholamines, and stool cards.  I personally reviewed and went over laboratory results with the patient.  The results are noted within this dictation.  Labs were unimpressive for any significant abnormality.  No role for labs in 6  months.  She has seen cardiology for her chest pain and ongoing testing [place] scheduled with follow-up.  She will follow-up with neurologist regarding her headaches and microvascular ischemia noted on MRI of brain.  She is on anticoagulation for her A-fib and reports compliance with this medication.  Problem list reviewed with patient and edited accordingly.  Medications are reviewed with the patient and edited accordingly.  Return in 6 months for follow-up.  More than 50% of the time spent with the patient was utilized for counseling and coordination of care.   ORDERS PLACED FOR THIS ENCOUNTER: No orders of the defined types were placed in this encounter.   MEDICATIONS PRESCRIBED THIS ENCOUNTER: No orders of the defined types were placed in this  encounter.   THERAPY PLAN:  Annual 24 hour urine collection for VMA and catecholamine.  All questions were answered. The patient knows to call the clinic with any problems, questions or concerns. We can certainly see the patient much sooner if necessary.  Patient and plan discussed with Dr. Twana First and she is in agreement with the aforementioned.   This note is electronically signed by: Robynn Pane, PA-C 06/20/2016 2:10 PM

## 2016-06-23 ENCOUNTER — Encounter (HOSPITAL_COMMUNITY)
Admission: RE | Admit: 2016-06-23 | Discharge: 2016-06-23 | Disposition: A | Discharge status: Home / Self Care | Payer: Medicare Other | Source: Ambulatory Visit | Attending: Cardiovascular Disease | Admitting: Cardiovascular Disease

## 2016-06-23 ENCOUNTER — Ambulatory Visit (HOSPITAL_COMMUNITY): Payer: Medicare Other

## 2016-06-23 ENCOUNTER — Ambulatory Visit (HOSPITAL_BASED_OUTPATIENT_CLINIC_OR_DEPARTMENT_OTHER)
Admission: RE | Admit: 2016-06-23 | Discharge: 2016-06-23 | Disposition: A | Discharge status: Home / Self Care | Payer: Medicare Other | Source: Ambulatory Visit | Attending: Cardiovascular Disease | Admitting: Cardiovascular Disease

## 2016-06-23 ENCOUNTER — Encounter (HOSPITAL_COMMUNITY): Payer: Self-pay

## 2016-06-23 ENCOUNTER — Inpatient Hospital Stay (HOSPITAL_COMMUNITY): Admission: RE | Admit: 2016-06-23 | Payer: Medicare Other | Source: Ambulatory Visit

## 2016-06-23 DIAGNOSIS — R002 Palpitations: Secondary | ICD-10-CM | POA: Insufficient documentation

## 2016-06-23 DIAGNOSIS — E7800 Pure hypercholesterolemia, unspecified: Secondary | ICD-10-CM | POA: Insufficient documentation

## 2016-06-23 DIAGNOSIS — I10 Essential (primary) hypertension: Secondary | ICD-10-CM

## 2016-06-23 DIAGNOSIS — K219 Gastro-esophageal reflux disease without esophagitis: Secondary | ICD-10-CM

## 2016-06-23 DIAGNOSIS — R0609 Other forms of dyspnea: Secondary | ICD-10-CM | POA: Diagnosis not present

## 2016-06-23 DIAGNOSIS — I071 Rheumatic tricuspid insufficiency: Secondary | ICD-10-CM

## 2016-06-23 DIAGNOSIS — R072 Precordial pain: Secondary | ICD-10-CM | POA: Diagnosis not present

## 2016-06-23 DIAGNOSIS — R001 Bradycardia, unspecified: Secondary | ICD-10-CM | POA: Insufficient documentation

## 2016-06-23 LAB — ECHOCARDIOGRAM COMPLETE
CHL CUP MV DEC (S): 401
E decel time: 401 msec
E/e' ratio: 8.31
FS: 42 % (ref 28–44)
IV/PV OW: 1.19
LA diam end sys: 41 mm
LA vol: 72.9 mL
LADIAMINDEX: 1.99 cm/m2
LASIZE: 41 mm
LAVOLA4C: 62 mL
LAVOLIN: 35.3 mL/m2
LV E/e' medial: 8.31
LV E/e'average: 8.31
LV SIMPSON'S DISK: 70
LV dias vol index: 35 mL/m2
LV dias vol: 73 mL (ref 46–106)
LV e' LATERAL: 6.74 cm/s
LV sys vol index: 11 mL/m2
LVOT SV: 65 mL
LVOT VTI: 28.5 cm
LVOT area: 2.27 cm2
LVOT diameter: 17 mm
LVOT peak grad rest: 5 mmHg
LVOTPV: 115 cm/s
LVSYSVOL: 22 mL
Lateral S' vel: 17.6 cm/s
MVPKAVEL: 97.5 m/s
MVPKEVEL: 56 m/s
PW: 10 mm — AB (ref 0.6–1.1)
RV sys press: 32 mmHg
Reg peak vel: 267 cm/s
Stroke v: 51 ml
TAPSE: 26.5 mm
TDI e' lateral: 6.74
TDI e' medial: 6.53
TR max vel: 267 cm/s

## 2016-06-23 LAB — NM MYOCAR MULTI W/SPECT W/WALL MOTION / EF
CHL CUP RESTING HR STRESS: 50 {beats}/min
LV sys vol: 35 mL
LVDIAVOL: 86 mL (ref 46–106)
Peak HR: 64 {beats}/min
RATE: 0.26
SDS: 7
SRS: 9
SSS: 16
TID: 0.65

## 2016-06-23 MED ORDER — SODIUM CHLORIDE 0.9% FLUSH
INTRAVENOUS | Status: AC
  Administered 2016-06-23: 10 mL via INTRAVENOUS
  Filled 2016-06-23: qty 10

## 2016-06-23 MED ORDER — TECHNETIUM TC 99M TETROFOSMIN IV KIT
10.0000 | PACK | Freq: Once | INTRAVENOUS | Status: AC | PRN
  Administered 2016-06-23: 10.6 via INTRAVENOUS

## 2016-06-23 MED ORDER — REGADENOSON 0.4 MG/5ML IV SOLN
INTRAVENOUS | Status: AC
  Administered 2016-06-23: 0.4 mg via INTRAVENOUS
  Filled 2016-06-23: qty 5

## 2016-06-23 MED ORDER — TECHNETIUM TC 99M TETROFOSMIN IV KIT
30.0000 | PACK | Freq: Once | INTRAVENOUS | Status: AC | PRN
  Administered 2016-06-23: 32 via INTRAVENOUS

## 2016-06-23 NOTE — Progress Notes (Signed)
*  PRELIMINARY RESULTS* Echocardiogram 2D Echocardiogram has been performed.  [name] 06/23/2016, 1:28 PM

## 2016-07-11 DIAGNOSIS — I679 Cerebrovascular disease, unspecified: Secondary | ICD-10-CM | POA: Diagnosis not present

## 2016-07-11 DIAGNOSIS — G7000 Myasthenia gravis without (acute) exacerbation: Secondary | ICD-10-CM | POA: Diagnosis not present

## 2016-07-28 ENCOUNTER — Encounter: Payer: Self-pay | Admitting: Adult Health

## 2016-07-28 ENCOUNTER — Ambulatory Visit (INDEPENDENT_AMBULATORY_CARE_PROVIDER_SITE_OTHER): Payer: Medicare Other | Admitting: Adult Health

## 2016-07-28 VITALS — BP 132/84 | HR 53 | Ht 63.0 in | Wt 202.0 lb

## 2016-07-28 DIAGNOSIS — K219 Gastro-esophageal reflux disease without esophagitis: Secondary | ICD-10-CM

## 2016-07-28 DIAGNOSIS — R079 Chest pain, unspecified: Secondary | ICD-10-CM | POA: Diagnosis not present

## 2016-07-28 DIAGNOSIS — I10 Essential (primary) hypertension: Secondary | ICD-10-CM | POA: Diagnosis not present

## 2016-07-28 DIAGNOSIS — I480 Paroxysmal atrial fibrillation: Secondary | ICD-10-CM

## 2016-07-28 MED ORDER — PROPRANOLOL HCL 20 MG PO TABS: 20.0000 mg | ORAL_TABLET | Freq: Three times a day (TID) | ORAL | 3 refills | Status: DC

## 2016-07-28 MED ORDER — APIXABAN 5 MG PO TABS: 5.0000 mg | ORAL_TABLET | Freq: Two times a day (BID) | ORAL | 3 refills | Status: DC

## 2016-07-28 NOTE — Progress Notes (Signed)
Cardiology Office Note   Date:  07/28/2016   ID:  [name], DOB 11/10/1940, MRN [id number]  PCP:  [name], MD  Cardiologist: [name], NP   Chief Complaint  Patient presents with  . Chest Pain  . Shortness of Breath      History of Present Illness: [name] is a 76 y.o. female who presents for ongoing assessment and management of PAF. She was last seen by Dr. [name] on [phone] and was complaining of chest pain and DOE. Most recent MPI was in 1\20\2016 and was found to be normal. Stress test was ordered for evaluation of new symptoms.   06/23/2016  Diffuse nonspecific T wave abnormalities with rest and stress.  Defect 1: There is a medium defect of moderate severity present in the mid inferoseptal and mid inferior location.This appears to be due to soft tissue attenuation artifact.  This is a low risk study.  Nuclear stress EF: 59%.  States she feels much better today. No further complaints. Has gone back up on pravastatin to 80 mg at the advisement of her neurologist.   Past Medical History:  Diagnosis Date  . Anemia   . Chronic back pain   . Chronic nausea   . Complication of anesthesia    Low blood pressure, heart rate, O2 sat  . Depression with anxiety   . Diverticulosis   . Essential hypertension   . GERD (gastroesophageal reflux disease)   . History of pneumonia   . Hypothyroidism   . IBS (irritable colon syndrome)   . LVH (left ventricular hypertrophy)   . Migraines   . Mixed hyperlipidemia   . Obesity   . Ocular myasthenia gravis ([place])   . Osteoarthritis of knee   . PAF (paroxysmal atrial fibrillation) ([facility])    chads2vasc score is at least 4  . Paraganglioma ([place])    Resection 02/2012 (retroperitoneal)  . Paraganglioma, malignant ([place]) 11/06/2015  . Renal insufficiency   . Skin cancer     Past Surgical History:  Procedure Laterality Date  . ABDOMINAL HYSTERECTOMY    . ABDOMINAL HYSTERECTOMY    . CARDIAC  CATHETERIZATION    . CHOLECYSTECTOMY    . COLONOSCOPY  08/24/2011   Procedure: COLONOSCOPY;  Surgeon: Rogene Houston, MD;  [facility];  Service: Endoscopy;  Laterality: N/A;  1200  . CT guided biopsy  01/16/12  . GIVENS CAPSULE STUDY  01/29/2012   Procedure: GIVENS CAPSULE STUDY;  Surgeon: Rogene Houston, MD;  [facility];  Service: Endoscopy;  Laterality: N/A;  730  . KNEE SURGERY    . LAPAROTOMY  03/01/2012   Procedure: EXPLORATORY LAPAROTOMY;  Surgeon: Earnstine Regal, MD;  Location: [facility];  Service: General;  Laterality: N/A;  Exploratory Laparotomy ,Resection Retroperitoneal Mass  . NASAL SINUS SURGERY     30 yrs ago  . TOTAL KNEE ARTHROPLASTY Bilateral    2005 and 2011     Current Outpatient Prescriptions  Medication Sig Dispense Refill  . ALPRAZolam (XANAX) 1 MG tablet Take 1 mg by mouth at bedtime as needed for sleep. can be repeated x 1 if needed. insomnia    . amitriptyline (ELAVIL) 25 MG tablet Take 25 mg by mouth at bedtime.    Marland Kitchen apixaban (ELIQUIS) 5 MG TABS tablet Take 1 tablet (5 mg total) by mouth 2 (two) times daily. 60 tablet 12  . Calcium Carbonate-Vit D-Min (CALTRATE 600+D PLUS MINERALS PO) Take 600 tablets  by mouth daily.     . Coenzyme Q10 (CO Q 10) 10 MG CAPS Take 10 mg by mouth every evening.     . Cyanocobalamin (B-12) 3000 MCG SUBL Place 1 tablet under the tongue every evening.    . diclofenac sodium (VOLTAREN) 1 % GEL Apply 4 g topically 4 (four) times daily as needed (for pain).     Marland Kitchen docusate sodium (COLACE) 100 MG capsule Take 100 mg by mouth every evening.     . Glucosamine 500 MG CAPS Take 500 mg by mouth every evening.     Marland Kitchen HYDROcodone-acetaminophen (NORCO) 10-325 MG per tablet Take 1 tablet by mouth every 6 (six) hours as needed (pain).     Marland Kitchen lansoprazole (PREVACID) 30 MG capsule TAKE (1) CAPSULE BY MOUTH TWICE DAILY. 60 capsule 11  . levothyroxine (SYNTHROID, LEVOTHROID) 100 MCG tablet Take 100 mcg by mouth daily before  breakfast.     . lidocaine (LIDODERM) 5 % Place 1-3 patches onto the skin daily as needed (for pain). Remove & Discard patch within 12 hours or as directed by MD for pain    . losartan (COZAAR) 50 MG tablet Take 50 mg by mouth daily.    . Magnesium 250 MG TABS Take 250 mg by mouth every evening.     . methocarbamol (ROBAXIN) 500 MG tablet Take 500 mg by mouth daily as needed. Muscle spasms    . Multiple Vitamin (MULTIVITAMIN WITH MINERALS) TABS Take 1 tablet by mouth every evening.     . Omega-3 Fatty Acids (FISH OIL) 1200 MG CAPS Take 1,200 mg by mouth every evening.     . pravastatin (PRAVACHOL) 80 MG tablet Take 40 mg by mouth every evening.     . Probiotic Product (ALIGN PO) Take 1 capsule by mouth every evening.     . propranolol (INDERAL) 20 MG tablet Take 1 tablet (20 mg total) by mouth 3 (three) times daily. 270 tablet 3  . pyridostigmine (MESTINON) 60 MG tablet Take 30 mg by mouth every 4 (four) hours. Take 60 mg by mouth in the AM, take 30 mg by mouth midday and take 30 mg by mouth at bedtime     . SUMAtriptan (IMITREX) 50 MG tablet Take 50 mg by mouth every 2 (two) hours as needed. For migraines     No current facility-administered medications for this visit.     Allergies:   Codeine; Oxycodone-acetaminophen; Tylox [oxycodone-acetaminophen]; Ampicillin; Avocado; Biaxin [clarithromycin]; Doxycycline monohydrate; Macrodantin [nitrofurantoin macrocrystal]; and Tetracyclines & related    Social History:  The patient  reports that she has never smoked. She has never used smokeless tobacco. She reports that she does not drink alcohol or use drugs.   Family History:  The patient's family history includes Inflammatory bowel disease in her maternal grandmother.    ROS: All other systems are reviewed and negative. Unless otherwise mentioned in H&P    PHYSICAL EXAM: VS:  There were no vitals taken for this visit. , BMI There is no height or weight on file to calculate BMI. GEN: Well  nourished, well developed, in no acute distress  HEENT: normal  Neck: no JVD, carotid bruits, or masses Cardiac: RRR; no murmurs, rubs, or gallops,no edema  Respiratory:  clear to auscultation bilaterally, normal work of breathing GI: soft, nontender, nondistended, + BS MS: no deformity or atrophy  Skin: warm and dry, no rash Neuro:  Strength and sensation are intact Psych: euthymic mood, full affect   Recent Labs: 06/02/2016:  ALT 11; BUN 22; Creatinine, Ser 1.21; Hemoglobin 13.9; Platelets 177; Potassium 4.1; Sodium 132    Lipid Panel No results found for: CHOL, TRIG, HDL, CHOLHDL, VLDL, LDLCALC, LDLDIRECT    Wt Readings from Last 3 Encounters:  06/20/16 203 lb (92.1 kg)  06/16/16 202 lb (91.6 kg)  06/06/16 198 lb (89.8 kg)      ASSESSMENT AND PLAN:  1. Chest Pain: Stress test is reassuring. She will continue secondary prevention with statin therapy, and exercise.   2. PAF: Heart rate is controlled. Continue Eliquis.   3. Hypertension: Well controlled on ARB. No changes    Current medicines are reviewed at length with the patient today.    Labs/ tests ordered today include:  No orders of the defined types were placed in this encounter.    Disposition:   FU with 6 months  Signed, Jory Sims, DNP, ANP-C AACC 07/28/2016 1:06 PM     [facility] [address] [place], [place] [address] Phone: [phone]; Fax: [phone]

## 2016-07-28 NOTE — Patient Instructions (Addendum)
Your physician wants you to follow-up in: 6 Month with Dr. [name]. You will receive a reminder letter in the mail two months in advance. If you don't receive a letter, please call our office to schedule the follow-up appointment.  Your physician recommends that you continue on your current medications as directed. Please refer to the Current Medication list given to you today.  If you need a refill on your cardiac medications before your next appointment, please call your pharmacy.  I have refilled your Eliquis and Propranolol   Thank you for choosing [facility]!

## 2016-08-02 ENCOUNTER — Telehealth (INDEPENDENT_AMBULATORY_CARE_PROVIDER_SITE_OTHER): Payer: Self-pay | Admitting: *Deleted

## 2016-08-02 NOTE — Telephone Encounter (Signed)
Patient called left message stating her lansoprazole needs a prior authorization. [phone]

## 2016-08-02 NOTE — Telephone Encounter (Signed)
This will be addressed. Patient will be called when completed.

## 2016-08-08 ENCOUNTER — Other Ambulatory Visit (INDEPENDENT_AMBULATORY_CARE_PROVIDER_SITE_OTHER): Payer: Self-pay | Admitting: Internal Medicine

## 2016-08-08 ENCOUNTER — Telehealth (INDEPENDENT_AMBULATORY_CARE_PROVIDER_SITE_OTHER): Payer: Self-pay | Admitting: Internal Medicine

## 2016-08-08 DIAGNOSIS — K219 Gastro-esophageal reflux disease without esophagitis: Secondary | ICD-10-CM

## 2016-08-08 NOTE — Telephone Encounter (Signed)
I called the patient to get insurance phone numbers for prior approval.  She didn't see any other phone numbers on her card.  She stated that the pharmacy stated that the first script for 3 months needed to come from Dr. Laural Golden with refills so they don't have to keep calling each month.  Customer Service # [phone]  Patient's # - [phone]

## 2016-08-09 NOTE — Telephone Encounter (Signed)
Per Clyde Canterbury, her script for the member and group #'s are:  Member # - [id number] Group # - [id number]

## 2016-08-15 DIAGNOSIS — N390 Urinary tract infection, site not specified: Secondary | ICD-10-CM | POA: Diagnosis not present

## 2016-08-15 DIAGNOSIS — Z6832 Body mass index (BMI) 32.0-32.9, adult: Secondary | ICD-10-CM | POA: Diagnosis not present

## 2016-08-15 DIAGNOSIS — R300 Dysuria: Secondary | ICD-10-CM | POA: Diagnosis not present

## 2016-08-17 NOTE — Telephone Encounter (Signed)
A PA has been completed. Patient was approved form 08/17/2016-08/17/2017. Both the patient and Pharmacy have been made aware.

## 2016-11-29 DIAGNOSIS — E039 Hypothyroidism, unspecified: Secondary | ICD-10-CM | POA: Diagnosis not present

## 2016-11-29 DIAGNOSIS — I10 Essential (primary) hypertension: Secondary | ICD-10-CM | POA: Diagnosis not present

## 2016-11-29 DIAGNOSIS — R300 Dysuria: Secondary | ICD-10-CM | POA: Diagnosis not present

## 2016-12-04 ENCOUNTER — Ambulatory Visit (HOSPITAL_COMMUNITY): Payer: Medicare Other

## 2016-12-04 ENCOUNTER — Other Ambulatory Visit (HOSPITAL_COMMUNITY): Payer: Medicare Other

## 2016-12-05 DIAGNOSIS — N390 Urinary tract infection, site not specified: Secondary | ICD-10-CM | POA: Diagnosis not present

## 2016-12-05 DIAGNOSIS — E782 Mixed hyperlipidemia: Secondary | ICD-10-CM | POA: Diagnosis not present

## 2016-12-05 DIAGNOSIS — Z6832 Body mass index (BMI) 32.0-32.9, adult: Secondary | ICD-10-CM | POA: Diagnosis not present

## 2016-12-05 DIAGNOSIS — E039 Hypothyroidism, unspecified: Secondary | ICD-10-CM | POA: Diagnosis not present

## 2016-12-05 DIAGNOSIS — I10 Essential (primary) hypertension: Secondary | ICD-10-CM | POA: Diagnosis not present

## 2016-12-05 DIAGNOSIS — G894 Chronic pain syndrome: Secondary | ICD-10-CM | POA: Diagnosis not present

## 2016-12-05 DIAGNOSIS — I482 Chronic atrial fibrillation: Secondary | ICD-10-CM | POA: Diagnosis not present

## 2016-12-05 DIAGNOSIS — N182 Chronic kidney disease, stage 2 (mild): Secondary | ICD-10-CM | POA: Diagnosis not present

## 2016-12-05 DIAGNOSIS — F5101 Primary insomnia: Secondary | ICD-10-CM | POA: Diagnosis not present

## 2016-12-07 ENCOUNTER — Other Ambulatory Visit (HOSPITAL_COMMUNITY): Payer: Self-pay | Admitting: Internal Medicine

## 2016-12-07 DIAGNOSIS — Z1231 Encounter for screening mammogram for malignant neoplasm of breast: Secondary | ICD-10-CM

## 2016-12-11 ENCOUNTER — Telehealth: Payer: Self-pay | Admitting: Cardiovascular Disease

## 2016-12-11 NOTE — Telephone Encounter (Signed)
Please call patient regarding instructions about Endarol due to being back in A-Fib. Please call with any questions and/or instructions. / tg

## 2016-12-11 NOTE — Telephone Encounter (Signed)
Attempted to reach, got VM, left message to call back

## 2016-12-12 NOTE — Telephone Encounter (Signed)
Patient has apt with Dr.Koneswaran tomorrow and will discuss with him then, HR is controlled now

## 2016-12-13 ENCOUNTER — Ambulatory Visit (INDEPENDENT_AMBULATORY_CARE_PROVIDER_SITE_OTHER): Payer: Medicare Other | Admitting: Cardiovascular Disease

## 2016-12-13 VITALS — BP 122/62 | HR 46 | Ht 64.0 in | Wt 199.0 lb

## 2016-12-13 DIAGNOSIS — I10 Essential (primary) hypertension: Secondary | ICD-10-CM | POA: Diagnosis not present

## 2016-12-13 DIAGNOSIS — E782 Mixed hyperlipidemia: Secondary | ICD-10-CM

## 2016-12-13 DIAGNOSIS — I4891 Unspecified atrial fibrillation: Secondary | ICD-10-CM | POA: Diagnosis not present

## 2016-12-13 DIAGNOSIS — I480 Paroxysmal atrial fibrillation: Secondary | ICD-10-CM

## 2016-12-13 NOTE — Progress Notes (Signed)
SUBJECTIVE: The patient presents for follow-up of paroxysmal atrial fibrillation.  She underwent a low risk nuclear stress test on 06/23/16, LVEF 59%.  She also has a history of retroperitoneal paraganglioma.  She has palpitations occasionally. She said she has gone into atrial fibrillation twice since her last visit with me. She takes propranolol 20 mg three times daily. She also takes this for migraine prophylaxis.   She denies dizziness, lightheadedness, and syncope.  ECG performed in the office today which I ordered and personally interpreted demonstrated marked sinus bradycardia, heart rate 40 bpm, with diffuse nonspecific T wave abnormalities.  She does water aerobics 6 days per week and also does yoga and Pilates on other days. She primarily consumes a Mediterranean diet.   There is a strong family history of strokes including 3 of her children who have had them.    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   Review of Systems: As per "subjective", otherwise negative.  Allergies  Allergen Reactions  . Codeine Itching    Can not take Codeine unless in cough syrup Can not take Codeine unless in cough syrup  . Oxycodone-Acetaminophen     Severe nausea Severe nausea  . Tylox [Oxycodone-Acetaminophen]     Severe nausea  . Ampicillin Rash  . Avocado Diarrhea and Nausea And Vomiting  . Biaxin [Clarithromycin] Rash  . Doxycycline Monohydrate Nausea And Vomiting  . Macrodantin [Nitrofurantoin Macrocrystal] Nausea And Vomiting  . Tetracyclines & Related Nausea And Vomiting    Current Outpatient Prescriptions  Medication Sig Dispense Refill  . apixaban (ELIQUIS) 5 MG TABS tablet Take 1 tablet (5 mg total) by mouth 2 (two) times daily. 180 tablet 3  . Calcium Carbonate-Vit D-Min (CALTRATE 600+D PLUS MINERALS PO) Take 600 tablets by mouth daily.     . Coenzyme Q10 (CO Q 10) 10 MG CAPS Take 10 mg by  mouth every evening.     . Cyanocobalamin (B-12) 3000 MCG SUBL Place 1 tablet under the tongue every evening.    . diclofenac sodium (VOLTAREN) 1 % GEL Apply 4 g topically 4 (four) times daily as needed (for pain).     Marland Kitchen docusate sodium (COLACE) 100 MG capsule Take 200 mg by mouth 2 (two) times daily.    Marland Kitchen HYDROcodone-acetaminophen (NORCO) 10-325 MG per tablet Take 1 tablet by mouth every 6 (six) hours as needed (pain).     Marland Kitchen lansoprazole (PREVACID) 30 MG capsule TAKE (1) CAPSULE BY MOUTH TWICE DAILY. 180 capsule 3  . levothyroxine (SYNTHROID, LEVOTHROID) 100 MCG tablet Take 100 mcg by mouth daily before breakfast.     . lidocaine (LIDODERM) 5 % Place 1-3 patches onto the skin daily as needed (for pain). Remove & Discard patch within 12 hours or as directed by MD for pain    . losartan (COZAAR) 50 MG tablet Take 50 mg by mouth daily.    . Magnesium 250 MG TABS Take 250 mg by mouth every evening.     . methocarbamol (ROBAXIN) 500 MG tablet Take 500 mg by mouth daily as needed. Muscle spasms    . Multiple Vitamin (MULTIVITAMIN WITH MINERALS) TABS Take 1 tablet by mouth every evening.     . Omega-3 Fatty Acids (FISH OIL) 1200 MG CAPS Take 1,200 mg by mouth every evening.     . pravastatin (PRAVACHOL) 80 MG tablet Take 80 mg by mouth every  evening.     . Probiotic Product (ALIGN PO) Take 1 capsule by mouth every evening.     . propranolol (INDERAL) 20 MG tablet Take 1 tablet (20 mg total) by mouth 3 (three) times daily. 270 tablet 3  . pyridostigmine (MESTINON) 60 MG tablet Take 30 mg by mouth every 4 (four) hours. Take 60 mg by mouth in the AM, take 30 mg by mouth midday and take 30 mg by mouth at bedtime     . SUMAtriptan (IMITREX) 50 MG tablet Take 50 mg by mouth every 2 (two) hours as needed. For migraines     No current facility-administered medications for this visit.     Past Medical History:  Diagnosis Date  . Anemia   . Chronic back pain   . Chronic nausea   . Complication of  anesthesia    Low blood pressure, heart rate, O2 sat  . Depression with anxiety   . Diverticulosis   . Essential hypertension   . GERD (gastroesophageal reflux disease)   . History of pneumonia   . Hypothyroidism   . IBS (irritable colon syndrome)   . LVH (left ventricular hypertrophy)   . Migraines   . Mixed hyperlipidemia   . Obesity   . Ocular myasthenia gravis ([place])   . Osteoarthritis of knee   . PAF (paroxysmal atrial fibrillation) (HCC)    chads2vasc score is at least 4  . Paraganglioma ([place])    Resection 02/2012 (retroperitoneal)  . Paraganglioma, malignant ([place]) 11/06/2015  . Renal insufficiency   . Skin cancer     Past Surgical History:  Procedure Laterality Date  . ABDOMINAL HYSTERECTOMY    . ABDOMINAL HYSTERECTOMY    . CARDIAC CATHETERIZATION    . CHOLECYSTECTOMY    . COLONOSCOPY  08/24/2011   Procedure: COLONOSCOPY;  Surgeon: Rogene Houston, MD;  [facility];  Service: Endoscopy;  Laterality: N/A;  1200  . CT guided biopsy  01/16/12  . GIVENS CAPSULE STUDY  01/29/2012   Procedure: GIVENS CAPSULE STUDY;  Surgeon: Rogene Houston, MD;  Location[facility];  Service: Endoscopy;  Laterality: N/A;  730  . KNEE SURGERY    . LAPAROTOMY  03/01/2012   Procedure: EXPLORATORY LAPAROTOMY;  Surgeon: Earnstine Regal, MD;  Location: [facility];  Service: General;  Laterality: N/A;  Exploratory Laparotomy ,Resection Retroperitoneal Mass  . NASAL SINUS SURGERY     30 yrs ago  . TOTAL KNEE ARTHROPLASTY Bilateral    2005 and 2011    Social History   Social History  . Marital status: Widowed    Spouse name: N/A  . Number of children: N/A  . Years of education: N/A   Occupational History  . Not on file.   Social History Main Topics  . Smoking status: Never Smoker  . Smokeless tobacco: Never Used  . Alcohol use No  . Drug use: No  . Sexual activity: Not on file   Other Topics Concern  . Not on file   Social History Narrative   Lives in [place]  alone.  Retired Quarry manager.     Vitals:   12/13/16 0906  BP: 122/62  Pulse: (!) 46  SpO2: 97%  Weight: 199 lb (90.3 kg)  Height: 5\' 4"  (1.626 m)    Wt Readings from Last 3 Encounters:  12/13/16 199 lb (90.3 kg)  07/28/16 202 lb (91.6 kg)  06/20/16 203 lb (92.1 kg)     PHYSICAL EXAM General: NAD  HEENT: Normal. Neck: No JVD, no thyromegaly. Lungs: Clear to auscultation bilaterally with normal respiratory effort. CV: Nondisplaced PMI.  Bradycardic, regular rhythm, normal S1/S2, no S3/S4, no murmur. No pretibial or periankle edema.  No carotid bruit.   Abdomen: Soft, nontender, no distention.  Neurologic: Alert and oriented.  Psych: Normal affect. Skin: Normal. Musculoskeletal: No gross deformities.    ECG: Most recent ECG reviewed.   Labs: Lab Results  Component Value Date/Time   K 4.1 06/02/2016 10:23 AM   BUN 22 (H) 06/02/2016 10:23 AM   CREATININE 1.21 (H) 06/02/2016 10:23 AM   CREATININE 1.05 (H) 05/04/2015 03:59 PM   ALT 11 (L) 06/02/2016 10:23 AM   TSH 0.090 (L) 04/28/2014 10:52 AM   HGB 13.9 06/02/2016 10:23 AM     Lipids: No results found for: LDLCALC, LDLDIRECT, CHOL, TRIG, HDL     ASSESSMENT AND PLAN:  1. Paroxysmal atrial fibrillation: Symptomatically stable on propranolol 20 mg TID and Eliquis. No changes to therapy.  2. Essential hypertension: Controlled on present therapy. No changes.  3. Hyperlipidemia: Continue statin therapy.      Disposition: Follow up 1 yr.   Kate Sable, M.D., F.A.C.C.

## 2016-12-13 NOTE — Patient Instructions (Signed)

## 2017-01-16 DIAGNOSIS — C755 Malignant neoplasm of aortic body and other paraganglia: Secondary | ICD-10-CM | POA: Diagnosis not present

## 2017-01-16 DIAGNOSIS — Z7901 Long term (current) use of anticoagulants: Secondary | ICD-10-CM | POA: Diagnosis not present

## 2017-01-16 DIAGNOSIS — Z1589 Genetic susceptibility to other disease: Secondary | ICD-10-CM | POA: Diagnosis not present

## 2017-01-16 DIAGNOSIS — Z5181 Encounter for therapeutic drug level monitoring: Secondary | ICD-10-CM | POA: Diagnosis not present

## 2017-01-16 DIAGNOSIS — N183 Chronic kidney disease, stage 3 unspecified: Secondary | ICD-10-CM | POA: Insufficient documentation

## 2017-01-16 DIAGNOSIS — I480 Paroxysmal atrial fibrillation: Secondary | ICD-10-CM | POA: Diagnosis not present

## 2017-01-25 ENCOUNTER — Ambulatory Visit: Payer: Medicare Other | Admitting: Cardiovascular Disease

## 2017-01-25 ENCOUNTER — Ambulatory Visit (HOSPITAL_COMMUNITY)
Admission: RE | Admit: 2017-01-25 | Discharge: 2017-01-25 | Disposition: A | Discharge status: Home / Self Care | Payer: Medicare Other | Source: Ambulatory Visit | Attending: Internal Medicine | Admitting: Internal Medicine

## 2017-01-25 ENCOUNTER — Encounter (HOSPITAL_COMMUNITY): Payer: Self-pay

## 2017-01-25 DIAGNOSIS — Z1231 Encounter for screening mammogram for malignant neoplasm of breast: Secondary | ICD-10-CM | POA: Diagnosis not present

## 2017-01-27 LAB — HEMOGLOBIN A1C: HEMOGLOBIN A1C: 5.3

## 2017-02-22 DIAGNOSIS — Z6831 Body mass index (BMI) 31.0-31.9, adult: Secondary | ICD-10-CM | POA: Diagnosis not present

## 2017-02-22 DIAGNOSIS — R070 Pain in throat: Secondary | ICD-10-CM | POA: Diagnosis not present

## 2017-02-22 DIAGNOSIS — R05 Cough: Secondary | ICD-10-CM | POA: Diagnosis not present

## 2017-04-24 DIAGNOSIS — Z1283 Encounter for screening for malignant neoplasm of skin: Secondary | ICD-10-CM | POA: Diagnosis not present

## 2017-04-24 DIAGNOSIS — B351 Tinea unguium: Secondary | ICD-10-CM | POA: Diagnosis not present

## 2017-04-24 DIAGNOSIS — Z85828 Personal history of other malignant neoplasm of skin: Secondary | ICD-10-CM | POA: Diagnosis not present

## 2017-04-24 DIAGNOSIS — Z08 Encounter for follow-up examination after completed treatment for malignant neoplasm: Secondary | ICD-10-CM | POA: Diagnosis not present

## 2017-04-24 DIAGNOSIS — L218 Other seborrheic dermatitis: Secondary | ICD-10-CM | POA: Diagnosis not present

## 2017-05-07 ENCOUNTER — Other Ambulatory Visit: Payer: Self-pay

## 2017-05-07 ENCOUNTER — Encounter: Payer: Self-pay | Admitting: Neurology

## 2017-05-07 ENCOUNTER — Ambulatory Visit (INDEPENDENT_AMBULATORY_CARE_PROVIDER_SITE_OTHER): Payer: Medicare Other | Admitting: Neurology

## 2017-05-07 VITALS — BP 131/64 | HR 53 | Ht 64.0 in | Wt 200.5 lb

## 2017-05-07 DIAGNOSIS — G7000 Myasthenia gravis without (acute) exacerbation: Secondary | ICD-10-CM | POA: Diagnosis not present

## 2017-05-07 MED ORDER — PYRIDOSTIGMINE BROMIDE 60 MG PO TABS: 60.0000 mg | ORAL_TABLET | Freq: Four times a day (QID) | ORAL | 3 refills | Status: DC

## 2017-05-07 NOTE — Progress Notes (Signed)
Reason for visit: Myasthenia gravis  Referring physician: Dr. Foster Simpson is a 77 y.o. female  History of present illness:  [name] is a 77 year old right-handed white female with a history of ocular myasthenia gravis that has been diagnosed 20 years ago.  The patient is on Mestinon only for treatment, she takes on average 3 of the 60 mg tablets daily.  The patient indicates that if she takes more than this she will have side effects with runny nose and tearing of the eyes.  She takes hydrocodone on a regular basis for chronic pain, she has a tendency for constipation.  She reports no weakness with chewing or swallowing, no problems with breathing.  The patient reports some generalized fatigue, no true weakness of the muscles of the arms or legs.  She tries to exercise on a regular basis.  She remains on propranolol 20 mg 3 times daily for rate control for her atrial fibrillation and to treat her migraine headache.  The patient on average has about 1 migraine headache a month.  The patient reports no significant problems with ptosis of the eyelids.  She has severe nearsighted vision, she has cataracts, she will be going to an ophthalmologist in the near future.  The patient cannot see well even when one eye is closed.  She had prisms in her glasses, but this did not fully correct her vision.  The patient reports no problems controlling the bowels or the bladder.  She is sent to this office for an evaluation.  She had been followed through Dr. Chales Salmon until just recently.  Past Medical History:  Diagnosis Date  . Anemia   . Chronic back pain   . Chronic nausea   . Complication of anesthesia    Low blood pressure, heart rate, O2 sat  . Depression with anxiety   . Depression with anxiety   . Diverticulosis   . Essential hypertension   . GERD (gastroesophageal reflux disease)   . History of pneumonia   . Hypothyroidism   . IBS (irritable colon syndrome)   . LVH (left  ventricular hypertrophy)   . Migraines   . Mixed hyperlipidemia   . Obesity   . Ocular myasthenia gravis ([place])   . Osteoarthritis of knee   . PAF (paroxysmal atrial fibrillation) (HCC)    chads2vasc score is at least 4  . Paraganglioma ([place])    Resection 02/2012 (retroperitoneal)  . Paraganglioma, malignant ([place]) 11/06/2015  . Renal insufficiency   . Skin cancer     Past Surgical History:  Procedure Laterality Date  . ABDOMINAL HYSTERECTOMY    . ABDOMINAL HYSTERECTOMY    . CARDIAC CATHETERIZATION    . CHOLECYSTECTOMY    . COLONOSCOPY  08/24/2011   Procedure: COLONOSCOPY;  Surgeon: Rogene Houston, MD;  [facility];  Service: Endoscopy;  Laterality: N/A;  1200  . CT guided biopsy  01/16/12  . GIVENS CAPSULE STUDY  01/29/2012   Procedure: GIVENS CAPSULE STUDY;  Surgeon: Rogene Houston, MD;  [facility];  Service: Endoscopy;  Laterality: N/A;  730  . KNEE SURGERY    . LAPAROTOMY  03/01/2012   Procedure: EXPLORATORY LAPAROTOMY;  Surgeon: Earnstine Regal, MD;  [facility];  Service: General;  Laterality: N/A;  Exploratory Laparotomy ,Resection Retroperitoneal Mass  . NASAL SINUS SURGERY     30 yrs ago  . TOTAL KNEE ARTHROPLASTY Bilateral    2005 and 2011  Family History  Problem Relation Age of Onset  . Inflammatory bowel disease Maternal Grandmother     Social history:  reports that  has never smoked. she has never used smokeless tobacco. She reports that she does not drink alcohol or use drugs.  Medications:  Prior to Admission medications   Medication Sig Start Date End Date Taking? Authorizing Provider  apixaban (ELIQUIS) 5 MG TABS tablet Take 1 tablet (5 mg total) by mouth 2 (two) times daily. 07/28/16  Yes Lendon Colonel, NP  Calcium Carbonate-Vit D-Min (CALTRATE 600+D PLUS MINERALS PO) Take 600 tablets by mouth daily.    Yes [provider]  Coenzyme Q10 (CO Q 10) 10 MG CAPS Take 10 mg by mouth every evening.    Yes [provider]  Cyanocobalamin (B-12) 3000 MCG SUBL Place 1 tablet under the tongue every evening. Time release   Yes [provider]  diclofenac sodium (VOLTAREN) 1 % GEL Apply 4 g topically 4 (four) times daily as needed (for pain).  01/13/15  Yes [provider]  docusate sodium (COLACE) 100 MG capsule Take 200 mg by mouth as needed.    Yes [provider]  HYDROcodone-acetaminophen (NORCO) 10-325 MG per tablet Take 1 tablet by mouth every 8 (eight) hours as needed (pain).    Yes [provider]  lansoprazole (PREVACID) 30 MG capsule TAKE (1) CAPSULE BY MOUTH TWICE DAILY. 08/08/16  Yes Setzer, Rona Ravens, NP  levothyroxine (SYNTHROID, LEVOTHROID) 100 MCG tablet Take 100 mcg by mouth daily before breakfast.  02/05/15  Yes [provider]  lidocaine (LIDODERM) 5 % Place 1-3 patches onto the skin daily as needed (for pain). Remove & Discard patch within 12 hours or as directed by MD for pain   Yes [provider]  losartan (COZAAR) 50 MG tablet Take 50 mg by mouth daily.   Yes [provider]  Magnesium 250 MG TABS Take 250 mg by mouth every evening.    Yes [provider]  methocarbamol (ROBAXIN) 500 MG tablet Take 500 mg by mouth as needed. Muscle spasms    Yes [provider]  Multiple Vitamin (MULTIVITAMIN WITH MINERALS) TABS Take 1 tablet by mouth every evening.    Yes [provider]  Omega-3 Fatty Acids (FISH OIL) 1200 MG CAPS Take 1,200 mg by mouth every evening.    Yes [provider]  pravastatin (PRAVACHOL) 80 MG tablet Take 80 mg by mouth every evening.    Yes [provider]  Probiotic Product (ALIGN PO) Take 1 capsule by mouth every evening.    Yes [provider]  propranolol (INDERAL) 20 MG tablet Take 1 tablet (20 mg total) by mouth 3 (three) times daily. 07/28/16  Yes Lendon Colonel, NP  pyridostigmine (MESTINON) 60 MG tablet Take 1 tablet (60 mg total) by mouth 4  (four) times daily. 05/07/17  Yes Kathrynn Ducking, MD  SUMAtriptan (IMITREX) 50 MG tablet Take 50 mg by mouth every 2 (two) hours as needed. For migraines   Yes [provider]      Allergies  Allergen Reactions  . Codeine Itching    Can not take Codeine unless in cough syrup Can not take Codeine unless in cough syrup  . Oxycodone-Acetaminophen     Severe nausea Severe nausea  . Tylox [Oxycodone-Acetaminophen]     Severe nausea  . Ampicillin Rash  . Avocado Diarrhea and Nausea And Vomiting  . Biaxin [Clarithromycin] Rash  . Doxycycline Monohydrate  Nausea And Vomiting  . Macrodantin [Nitrofurantoin Macrocrystal] Nausea And Vomiting  . Tetracyclines & Related Nausea And Vomiting    ROS:  Out of a complete 14 system review of symptoms, the patient complains only of the following symptoms, and all other reviewed systems are negative.  Fatigue Palpitations of the heart, heart murmur Moles Double vision Shortness of breath Easy bruising, easy bleeding Feeling cold Joint pain, joint swelling, muscle cramps Runny nose Headache, weakness Sleep apnea  Blood pressure 131/64, pulse (!) 53, height 5\' 4"  (1.626 m), weight 200 lb 8 oz (90.9 kg).  Physical Exam  General: The patient is alert and cooperative at the time of the examination.  Eyes: Pupils are equal, round, and reactive to light. Discs are flat bilaterally.  Neck: The neck is supple, no carotid bruits are noted.  Respiratory: The respiratory examination is clear.  Cardiovascular: The cardiovascular examination reveals a regular rate and rhythm, no obvious murmurs or rubs are noted.  Skin: Extremities are without significant edema.  Neurologic Exam  Mental status: The patient is alert and oriented x 3 at the time of the examination. The patient has apparent normal recent and remote memory, with an apparently normal attention span and concentration ability.  Cranial nerves: Facial symmetry is present.  There is good sensation of the face to pinprick and soft touch bilaterally. The strength of the facial muscles and the muscles to head turning and shoulder shrug are normal bilaterally. Speech is well enunciated, no aphasia or dysarthria is noted. Extraocular movements are full. Visual fields are full. The tongue is midline, and the patient has symmetric elevation of the soft palate. No obvious hearing deficits are noted.  With superior gaze for 1 minute, no divergence of gaze was noted.  The patient has some mild ptosis of the right eye that did not change.  Motor: The motor testing reveals 5 over 5 strength of all 4 extremities. Good symmetric motor tone is noted throughout.  With the arms outstretched for 1 minute, no true fatigable weakness of the deltoid muscles was seen, the patient has pain in both shoulders however.  Sensory: Sensory testing is intact to pinprick, soft touch, vibration sensation, and position sense on all 4 extremities. No evidence of extinction is noted.  Coordination: Cerebellar testing reveals good finger-nose-finger and heel-to-shin bilaterally.  Gait and station: Gait is normal. Tandem gait is slightly unsteady. Romberg is negative. No drift is seen.  Reflexes: Deep tendon reflexes are symmetric and normal bilaterally. Toes are downgoing bilaterally.   Assessment/Plan:  1.  Ocular myasthenia gravis  2.  Migraine headache  The patient does have ocular myasthenia gravis, but she also has significant visual impairment in both eyes.  The patient will be seen through ophthalmology in the near future.  She will be given a prescription for Mestinon taking 60 mg 3 times to 4 times daily.  The patient will follow-up through this office in 6 months.  Pamela Alexanders MD 05/07/2017 12:15 PM  [facility] [address] [place], [place] [address]  Phone [phone] Fax [phone]

## 2017-05-18 DIAGNOSIS — H5010 Unspecified exotropia: Secondary | ICD-10-CM | POA: Diagnosis not present

## 2017-05-18 DIAGNOSIS — H524 Presbyopia: Secondary | ICD-10-CM | POA: Diagnosis not present

## 2017-05-24 DIAGNOSIS — I10 Essential (primary) hypertension: Secondary | ICD-10-CM | POA: Diagnosis not present

## 2017-05-24 DIAGNOSIS — E782 Mixed hyperlipidemia: Secondary | ICD-10-CM | POA: Diagnosis not present

## 2017-05-24 DIAGNOSIS — E039 Hypothyroidism, unspecified: Secondary | ICD-10-CM | POA: Diagnosis not present

## 2017-05-28 DIAGNOSIS — N644 Mastodynia: Secondary | ICD-10-CM | POA: Diagnosis not present

## 2017-05-28 DIAGNOSIS — G894 Chronic pain syndrome: Secondary | ICD-10-CM | POA: Diagnosis not present

## 2017-05-28 DIAGNOSIS — R0602 Shortness of breath: Secondary | ICD-10-CM | POA: Diagnosis not present

## 2017-05-28 DIAGNOSIS — I10 Essential (primary) hypertension: Secondary | ICD-10-CM | POA: Diagnosis not present

## 2017-05-28 DIAGNOSIS — I4891 Unspecified atrial fibrillation: Secondary | ICD-10-CM | POA: Diagnosis not present

## 2017-05-28 DIAGNOSIS — G7000 Myasthenia gravis without (acute) exacerbation: Secondary | ICD-10-CM | POA: Diagnosis not present

## 2017-05-28 DIAGNOSIS — G4700 Insomnia, unspecified: Secondary | ICD-10-CM | POA: Diagnosis not present

## 2017-05-28 DIAGNOSIS — N183 Chronic kidney disease, stage 3 (moderate): Secondary | ICD-10-CM | POA: Diagnosis not present

## 2017-05-28 DIAGNOSIS — E039 Hypothyroidism, unspecified: Secondary | ICD-10-CM | POA: Diagnosis not present

## 2017-05-28 DIAGNOSIS — E782 Mixed hyperlipidemia: Secondary | ICD-10-CM | POA: Diagnosis not present

## 2017-05-28 DIAGNOSIS — Z0000 Encounter for general adult medical examination without abnormal findings: Secondary | ICD-10-CM | POA: Diagnosis not present

## 2017-05-28 DIAGNOSIS — M25511 Pain in right shoulder: Secondary | ICD-10-CM | POA: Diagnosis not present

## 2017-05-29 ENCOUNTER — Other Ambulatory Visit (HOSPITAL_COMMUNITY): Payer: Self-pay | Admitting: Internal Medicine

## 2017-05-29 DIAGNOSIS — Z780 Asymptomatic menopausal state: Secondary | ICD-10-CM

## 2017-06-04 ENCOUNTER — Encounter (HOSPITAL_COMMUNITY): Payer: Self-pay

## 2017-06-04 ENCOUNTER — Other Ambulatory Visit (HOSPITAL_COMMUNITY): Payer: Medicare Other

## 2017-06-25 ENCOUNTER — Ambulatory Visit (INDEPENDENT_AMBULATORY_CARE_PROVIDER_SITE_OTHER): Payer: Medicare Other | Admitting: Physician Assistant

## 2017-06-25 ENCOUNTER — Encounter: Payer: Self-pay | Admitting: *Deleted

## 2017-06-25 ENCOUNTER — Encounter: Payer: Self-pay | Admitting: Physician Assistant

## 2017-06-25 ENCOUNTER — Other Ambulatory Visit (HOSPITAL_COMMUNITY)
Admission: RE | Admit: 2017-06-25 | Discharge: 2017-06-25 | Disposition: A | Discharge status: Home / Self Care | Payer: Medicare Other | Source: Ambulatory Visit | Attending: Physician Assistant | Admitting: Physician Assistant

## 2017-06-25 VITALS — BP 126/70 | HR 45 | Ht 63.0 in | Wt 192.0 lb

## 2017-06-25 DIAGNOSIS — I10 Essential (primary) hypertension: Secondary | ICD-10-CM

## 2017-06-25 DIAGNOSIS — R0609 Other forms of dyspnea: Secondary | ICD-10-CM | POA: Diagnosis not present

## 2017-06-25 DIAGNOSIS — E7800 Pure hypercholesterolemia, unspecified: Secondary | ICD-10-CM | POA: Insufficient documentation

## 2017-06-25 DIAGNOSIS — R001 Bradycardia, unspecified: Secondary | ICD-10-CM

## 2017-06-25 DIAGNOSIS — I480 Paroxysmal atrial fibrillation: Secondary | ICD-10-CM | POA: Insufficient documentation

## 2017-06-25 LAB — COMPREHENSIVE METABOLIC PANEL
ALBUMIN: 3.8 g/dL (ref 3.5–5.0)
ALK PHOS: 56 U/L (ref 38–126)
ALT: 13 U/L — ABNORMAL LOW (ref 14–54)
ANION GAP: 9 (ref 5–15)
AST: 16 U/L (ref 15–41)
BUN: 13 mg/dL (ref 6–20)
CALCIUM: 9.8 mg/dL (ref 8.9–10.3)
CHLORIDE: 106 mmol/L (ref 101–111)
CO2: 27 mmol/L (ref 22–32)
Creatinine, Ser: 0.86 mg/dL (ref 0.44–1.00)
GFR calc Af Amer: >60 mL/min (ref >60)
GFR calc non Af Amer: >60 mL/min (ref >60)
GLUCOSE: 96 mg/dL (ref 65–99)
Potassium: 4 mmol/L (ref 3.5–5.1)
SODIUM: 142 mmol/L (ref 135–145)
Total Bilirubin: 1 mg/dL (ref 0.3–1.2)
Total Protein: 7.4 g/dL (ref 6.5–8.1)

## 2017-06-25 LAB — CBC WITH DIFFERENTIAL/PLATELET
BASOS PCT: 1 %
Basophils Absolute: 0 10*3/uL (ref 0.0–0.1)
Eosinophils Absolute: 0.1 10*3/uL (ref 0.0–0.7)
Eosinophils Relative: 2 %
HCT: 43.5 % (ref 36.0–46.0)
HEMOGLOBIN: 14.1 g/dL (ref 12.0–15.0)
LYMPHS ABS: 2 10*3/uL (ref 0.7–4.0)
LYMPHS PCT: 28 %
MCH: 32.7 pg (ref 26.0–34.0)
MCHC: 32.4 g/dL (ref 30.0–36.0)
MCV: 100.9 fL — AB (ref 78.0–100.0)
MONO ABS: 0.5 10*3/uL (ref 0.1–1.0)
MONOS PCT: 7 %
NEUTROS ABS: 4.5 10*3/uL (ref 1.7–7.7)
Neutrophils Relative %: 62 %
Platelets: 142 10*3/uL — ABNORMAL LOW (ref 150–400)
RBC: 4.31 MIL/uL (ref 3.87–5.11)
RDW: 12.8 % (ref 11.5–15.5)
WBC: 7.1 10*3/uL (ref 4.0–10.5)

## 2017-06-25 NOTE — Progress Notes (Signed)
Cardiology Office Note    Date:  06/25/2017   ID:  [name], DOB 05-17-1940, MRN [id number]  PCP:  [name], MD  Cardiologist: Kate Sable, MD  No chief complaint on file.   History of Present Illness:  [name] is a 77 y.o. female, retired Marine scientist, with history of PAF on propanolol 3 times daily which she also takes for headaches.  CHA2DS2-VASc equals 4 on Eliquis, low risk nuclear stress test 06/23/16 LVEF 59%, strong history of strokes with 3 of her children having them, history of retroperitoneal paraganglioma resected in 2013, hypertension, mixed hyperlipidemia, ocular myasthenia gravis.  Remote cardiac catheterization 17 years ago that showed a "33% blockage in a small artery".   Last saw Dr. [name] 11/2016 and was doing well, bradycardic in the 97s.  Complains of worsening dyspnea on exertion. Also complains of chest pain into her back but also has spinal stenosis and a lot of symptoms in her chest. Not feeling well since Nov-pulmonary problem, daughter died of MI in Dec(ICU nurse here, smoker,diabetic and didn't take care of herself), GI-diarrhea in March-hasn't been exercising since then-fatigued.  Says the dyspnea feels like when she goes into atrial fibrillation but she is not going into atrial fibrillation.  She has not had an episode since she was here last.  Says her heart rate stays in the 40s.  Usually swims at the [facility] but has only done it twice since November.  No dyspnea on exertion or chest pain with swimming but has it with weightbearing exercises.  Relieved quickly with laying down.   Past Medical History:  Diagnosis Date  . Anemia   . Chronic back pain   . Chronic nausea   . Complication of anesthesia    Low blood pressure, heart rate, O2 sat  . Depression with anxiety   . Depression with anxiety   . Diverticulosis   . Essential hypertension   . GERD (gastroesophageal reflux disease)   . History of pneumonia   . Hypothyroidism   . IBS  (irritable colon syndrome)   . LVH (left ventricular hypertrophy)   . Migraines   . Mixed hyperlipidemia   . Obesity   . Ocular myasthenia gravis ([place])   . Osteoarthritis of knee   . PAF (paroxysmal atrial fibrillation) (HCC)    chads2vasc score is at least 4  . Paraganglioma ([place])    Resection 02/2012 (retroperitoneal)  . Paraganglioma, malignant ([place]) 11/06/2015  . Renal insufficiency   . Skin cancer     Past Surgical History:  Procedure Laterality Date  . ABDOMINAL HYSTERECTOMY    . ABDOMINAL HYSTERECTOMY    . CARDIAC CATHETERIZATION    . CHOLECYSTECTOMY    . COLONOSCOPY  08/24/2011   Procedure: COLONOSCOPY;  Surgeon: Rogene Houston, MD;  Location: [facility];  Service: Endoscopy;  Laterality: N/A;  1200  . CT guided biopsy  01/16/12  . GIVENS CAPSULE STUDY  01/29/2012   Procedure: GIVENS CAPSULE STUDY;  Surgeon: Rogene Houston, MD;  Location: [facility];  Service: Endoscopy;  Laterality: N/A;  730  . KNEE SURGERY    . LAPAROTOMY  03/01/2012   Procedure: EXPLORATORY LAPAROTOMY;  Surgeon: Earnstine Regal, MD;  Location: [facility];  Service: General;  Laterality: N/A;  Exploratory Laparotomy ,Resection Retroperitoneal Mass  . NASAL SINUS SURGERY     30 yrs ago  . TOTAL KNEE ARTHROPLASTY Bilateral    2005 and 2011    Current Medications:  Current Meds  Medication Sig  . apixaban (ELIQUIS) 5 MG TABS tablet Take 1 tablet (5 mg total) by mouth 2 (two) times daily.  . Calcium Carbonate-Vit D-Min (CALTRATE 600+D PLUS MINERALS PO) Take 300 tablets by mouth daily.   . Coenzyme Q10 (CO Q 10) 10 MG CAPS Take 10 mg by mouth every evening.   . Cyanocobalamin (B-12) 3000 MCG SUBL Place 1 tablet under the tongue every evening. Time release  . diclofenac sodium (VOLTAREN) 1 % GEL Apply 4 g topically 4 (four) times daily as needed (for pain).   Marland Kitchen HYDROcodone-acetaminophen (NORCO) 10-325 MG per tablet Take 1 tablet by mouth every 8 (eight) hours as needed (pain).   Marland Kitchen lansoprazole  (PREVACID) 30 MG capsule TAKE (1) CAPSULE BY MOUTH TWICE DAILY.  Marland Kitchen levothyroxine (SYNTHROID, LEVOTHROID) 100 MCG tablet Take 100 mcg by mouth daily before breakfast.   . lidocaine (LIDODERM) 5 % Place 1-3 patches onto the skin daily as needed (for pain). Remove & Discard patch within 12 hours or as directed by MD for pain  . losartan (COZAAR) 50 MG tablet Take 50 mg by mouth daily.  . Magnesium 250 MG TABS Take 250 mg by mouth every evening.   . Multiple Vitamin (MULTIVITAMIN WITH MINERALS) TABS Take 1 tablet by mouth every evening.   . Omega-3 Fatty Acids (FISH OIL) 1200 MG CAPS Take 1,200 mg by mouth every evening.   . pravastatin (PRAVACHOL) 80 MG tablet Take 80 mg by mouth every evening.   . Probiotic Product (ALIGN PO) Take 1 capsule by mouth every evening.   . propranolol (INDERAL) 20 MG tablet Take 1 tablet (20 mg total) by mouth 3 (three) times daily.  Marland Kitchen pyridostigmine (MESTINON) 60 MG tablet Take 1 tablet (60 mg total) by mouth 4 (four) times daily.  . SUMAtriptan (IMITREX) 50 MG tablet Take 50 mg by mouth every 2 (two) hours as needed. For migraines  . [DISCONTINUED] docusate sodium (COLACE) 100 MG capsule Take 200 mg by mouth as needed.   . [DISCONTINUED] methocarbamol (ROBAXIN) 500 MG tablet Take 500 mg by mouth as needed. Muscle spasms      Allergies:   Codeine; Oxycodone-acetaminophen; Tylox [oxycodone-acetaminophen]; Ampicillin; Avocado; Biaxin [clarithromycin]; Doxycycline monohydrate; Macrodantin [nitrofurantoin macrocrystal]; and Tetracyclines & related   Social History   Socioeconomic History  . Marital status: Widowed    Spouse name: Not on file  . Number of children: Not on file  . Years of education: Not on file  . Highest education level: Not on file  Occupational History  . Occupation: Retired Quarry manager  Social Needs  . Financial resource strain: Not on file  . Food insecurity:    Worry: Not on file    Inability: Not on file  . Transportation needs:     Medical: Not on file    Non-medical: Not on file  Tobacco Use  . Smoking status: Never Smoker  . Smokeless tobacco: Never Used  Substance and Sexual Activity  . Alcohol use: No    Alcohol/week: 0.0 oz  . Drug use: No  . Sexual activity: Not on file  Lifestyle  . Physical activity:    Days per week: Not on file    Minutes per session: Not on file  . Stress: Not on file  Relationships  . Social connections:    Talks on phone: Not on file    Gets together: Not on file    Attends religious service: Not on file    Active  member of club or organization: Not on file    Attends meetings of clubs or organizations: Not on file    Relationship status: Not on file  Other Topics Concern  . Not on file  Social History Narrative   Lives in [place] alone.  Retired Quarry manager.   Caffeine use:    Right handed     Family History:  The patient's family history includes Inflammatory bowel disease in her maternal grandmother.   ROS:   Please see the history of present illness.    Review of Systems  Constitution: Positive for decreased appetite.  HENT: Negative.   Eyes: Positive for visual disturbance.  Cardiovascular: Positive for dyspnea on exertion.  Respiratory: Negative.   Hematologic/Lymphatic: Negative.   Musculoskeletal: Positive for arthritis, myalgias, neck pain and stiffness. Negative for joint pain.  Gastrointestinal: Positive for diarrhea.  Genitourinary: Negative.   Neurological: Positive for weakness.   All other systems reviewed and are negative.   PHYSICAL EXAM:   VS:  BP 126/70   Pulse (!) 45   Ht 5\' 3"  (1.6 m)   Wt 192 lb (87.1 kg)   SpO2 97%   BMI 34.01 kg/m   Physical Exam  GEN: Well nourished, well developed, in no acute distress  Neck: no JVD, carotid bruits, or masses Cardiac:RRR; no murmurs, rubs, or gallops  Respiratory:  clear to auscultation bilaterally, normal work of breathing GI: soft, nontender, nondistended, + BS Ext: without cyanosis,  clubbing, or edema, Good distal pulses bilaterally Neuro:  Alert and Oriented x 3 Psych: euthymic mood, full affect  Wt Readings from Last 3 Encounters:  06/25/17 192 lb (87.1 kg)  05/07/17 200 lb 8 oz (90.9 kg)  12/13/16 199 lb (90.3 kg)      Studies/Labs Reviewed:   EKG:  EKG is ordered today.  The ekg ordered today demonstrates sinus bradycardia at 43 bpm, no acute change  Recent Labs: No results found for requested labs within last 8760 hours.   Lipid Panel No results found for: CHOL, TRIG, HDL, CHOLHDL, VLDL, LDLCALC, LDLDIRECT  Additional studies/ records that were reviewed today include:  06/23/2016  Diffuse nonspecific T wave abnormalities with rest and stress.  Defect 1: There is a medium defect of moderate severity present in the mid inferoseptal and mid inferior location.This appears to be due to soft tissue attenuation artifact.  This is a low risk study.  Nuclear stress EF: 59%.   2D echo 06/23/16 Study Conclusions   - Left ventricle: The cavity size was normal. Wall thickness was   increased in a pattern of mild LVH. Systolic function was normal.   The estimated ejection fraction was in the range of 60% to 65%.   Wall motion was normal; there were no regional wall motion   abnormalities. Doppler parameters are consistent with abnormal   left ventricular relaxation (grade 1 diastolic dysfunction). - Left atrium: The atrium was moderately dilated. - Tricuspid valve: There was mild regurgitation.      ASSESSMENT:    1. Dyspnea on exertion   2. Paroxysmal atrial fibrillation (HCC)   3. Essential hypertension   4. Sinus bradycardia   5. High cholesterol      PLAN:  In order of problems listed above:  Dyspnea on exertion worsened over the last 3 months but lots of illnesses and stress.  Also having some exertional chest pain into her back.  Normal nuclear stress test 06/23/16.  Wonder if some of the symptoms could be coming  from her bradycardia.  We will  place 48-hour monitor.  Also check labs and obtain labs done by Dr. Nevada Crane.  She did have a GI bug with significant diarrhea so will not make sure she her electrolytes are all stable.  Follow-up with Dr. [name] after.  PAF CHA2DS2-VASc equals 4 on Eliquis and propanolol-no episodes since she is last been here  Bradycardia heart rate has been in the 40s for a while.  Checking monitor to make sure she is not getting slower than this.  Hypertension blood pressure well controlled  Hyperlipidemia on Pravachol    Medication Adjustments/Labs and Tests Ordered: Current medicines are reviewed at length with the patient today.  Concerns regarding medicines are outlined above.  Medication changes, Labs and Tests ordered today are listed in the Patient Instructions below. Patient Instructions  Medication Instructions: Your physician recommends that you continue on your current medications as directed. Please refer to the Current Medication list given to you today.   Labwork: Your physician recommends that you return for lab work in: Today    Testing/Procedures: Your physician has recommended that you wear a holter monitor. Holter monitors are medical devices that record the heart's electrical activity. Doctors most often use these monitors to diagnose arrhythmias. Arrhythmias are problems with the speed or rhythm of the heartbeat. The monitor is a small, portable device. You can wear one while you do your normal daily activities. This is usually used to diagnose what is causing palpitations/syncope (passing out).     Follow-Up: Your physician recommends that you schedule a follow-up appointment with Dr. [name]   Any Other Special Instructions Will Be Listed Below (If Applicable).     If you need a refill on your cardiac medications before your next appointment, please call your pharmacy. Thank you for choosing [facility]!       Sumner Boast, PA-C  06/25/2017  2:55 PM    [facility] [place], [place], [place]  [address] Phone: [phone]; Fax: [phone]

## 2017-06-25 NOTE — Patient Instructions (Addendum)
Medication Instructions: Your physician recommends that you continue on your current medications as directed. Please refer to the Current Medication list given to you today.   Labwork: Your physician recommends that you return for lab work in: Today    Testing/Procedures: Your physician has recommended that you wear a holter monitor. Holter monitors are medical devices that record the heart's electrical activity. Doctors most often use these monitors to diagnose arrhythmias. Arrhythmias are problems with the speed or rhythm of the heartbeat. The monitor is a small, portable device. You can wear one while you do your normal daily activities. This is usually used to diagnose what is causing palpitations/syncope (passing out).     Follow-Up: Your physician recommends that you schedule a follow-up appointment with Dr. [name]   Any Other Special Instructions Will Be Listed Below (If Applicable).     If you need a refill on your cardiac medications before your next appointment, please call your pharmacy. Thank you for choosing [facility]!

## 2017-06-26 ENCOUNTER — Ambulatory Visit (HOSPITAL_COMMUNITY)
Admission: RE | Admit: 2017-06-26 | Discharge: 2017-06-26 | Disposition: A | Discharge status: Home / Self Care | Payer: Medicare Other | Source: Ambulatory Visit | Attending: Physician Assistant | Admitting: Physician Assistant

## 2017-06-26 ENCOUNTER — Telehealth: Payer: Self-pay | Admitting: *Deleted

## 2017-06-26 DIAGNOSIS — I480 Paroxysmal atrial fibrillation: Secondary | ICD-10-CM

## 2017-06-26 DIAGNOSIS — R001 Bradycardia, unspecified: Secondary | ICD-10-CM

## 2017-06-26 DIAGNOSIS — I493 Ventricular premature depolarization: Secondary | ICD-10-CM | POA: Insufficient documentation

## 2017-06-26 DIAGNOSIS — H2513 Age-related nuclear cataract, bilateral: Secondary | ICD-10-CM | POA: Diagnosis not present

## 2017-06-26 NOTE — Telephone Encounter (Signed)
Called patient with test results. Phone msg states phone has been changed or disconnected.

## 2017-06-27 ENCOUNTER — Telehealth: Payer: Self-pay | Admitting: Physician Assistant

## 2017-06-27 NOTE — Telephone Encounter (Signed)
Pt had a spell of Afib this morning and had to take extra meds please give her a call

## 2017-06-27 NOTE — Telephone Encounter (Signed)
Pt states she went into Afib at 0500 AM at that time she took and Inderal. At 5:30 she took the second one and at 0600 she took the third one. Pt states she converted back to sinus rhythm at 0730 she was able to take apical pulse. Pt did orthostatic BP  Lying BP is 118/66 HR 43, Sitting 118/76 HR 45, Standing 109/84 HR 54. Pt is still feeling palpitations. She states this is new for her. She wants to know if she should take her next dose of Inderal. Please advise.

## 2017-06-28 NOTE — Telephone Encounter (Signed)
Just saw this!! Thanks [place]

## 2017-06-28 NOTE — Telephone Encounter (Signed)
Discussed with Kisha yesterday afternoon, would recommend resuming Inderal with her next scheduled dose which was last night. Holter monitor currently in place due to bradycardia.   Signed, Erma Heritage, PA-C 06/28/2017, 7:15 AM

## 2017-07-02 ENCOUNTER — Telehealth: Payer: Self-pay

## 2017-07-02 NOTE — Telephone Encounter (Signed)
-----   Message from Fay Records, MD sent at 06/30/2017  9:03 PM EDT ----- Reviewed with Beckie Salts   INtermitt atrial fib with RVR Average HR slow Would recomm appt with him in clinic in [place]

## 2017-07-02 NOTE — Telephone Encounter (Signed)
Pt made with Dr.Taylor on 4/25 at 3:30 pm at the [place] office, copied pcp

## 2017-07-03 ENCOUNTER — Encounter (INDEPENDENT_AMBULATORY_CARE_PROVIDER_SITE_OTHER): Payer: Self-pay | Admitting: Internal Medicine

## 2017-07-03 ENCOUNTER — Ambulatory Visit (INDEPENDENT_AMBULATORY_CARE_PROVIDER_SITE_OTHER): Payer: Medicare Other | Admitting: Internal Medicine

## 2017-07-03 VITALS — BP 108/60 | HR 53 | Temp 97.6°F | Resp 18 | Ht 63.0 in | Wt 194.9 lb

## 2017-07-03 DIAGNOSIS — R0789 Other chest pain: Secondary | ICD-10-CM

## 2017-07-03 DIAGNOSIS — K219 Gastro-esophageal reflux disease without esophagitis: Secondary | ICD-10-CM

## 2017-07-03 NOTE — Patient Instructions (Addendum)
Increase lansoprazole to 30 mg by mouth twice daily for 8 weeks. Unless chest pain frequency decreases can go back to lansoprazole once daily. Please let her cardiologist know about your chest pain on her next visit. Call office with progress report in 8 weeks.

## 2017-07-03 NOTE — Progress Notes (Signed)
Presenting complaint;  Follow-up for GERD. Patient complains of chest pain.  Subjective:  Patient is 77 year old Caucasian female with multiple medical problems who is here for scheduled visit.  She was last seen in March 2018.  She has history of IBS but lately has not had any problems.  She is not taking dicyclomine.  She says heartburn is well controlled with therapy.  She may have an episode here and there.  However for the last month or so she has been having chest pain.  This pain is not associated with shortness of breath palpitation lightheadedness.  She seemed to have more pain when she is supine.  Pain radiates into her back.  She also complains of right shoulder pain as well as back pain.  She takes pain medication to 3 times a day.  She says pain medication helps with her chest pain as well.  She also complains of irregular heartbeat.  She is scheduled to be seen by electrophysiologist.  She says she only has dysphagia with large pills.  She has no difficulty with solids or liquids.  She has been under a lot of stress.  She lost her daughter a few months ago.  Her other daughter [name] has told that she is depressed but patient does not feel so and does not want to take any medications.  She lives alone.  She has ocular myasthenia gravis and therefore not drive but she has good support system.  She is trying to go [facility] for exercise.  She wants to know if she should continue probiotic.  She is not sure why she is taking probiotic. She says she had a bad spell of diarrhea last month.  She did not have rectal bleeding or fever.  Diarrhea resolved spontaneously.    Current Medications: Outpatient Encounter Medications as of 07/03/2017  Medication Sig  . apixaban (ELIQUIS) 5 MG TABS tablet Take 1 tablet (5 mg total) by mouth 2 (two) times daily.  . Calcium Carbonate-Vit D-Min (CALTRATE 600+D PLUS MINERALS PO) Take 300 tablets by mouth daily. Patient states that she takes 1/2 the tablet.  .  Coenzyme Q10 (CO Q 10) 10 MG CAPS Take 10 mg by mouth every evening.   . Cyanocobalamin (B-12) 3000 MCG SUBL Place 1 tablet under the tongue every evening. Time release  . diclofenac sodium (VOLTAREN) 1 % GEL Apply 4 g topically 4 (four) times daily as needed (for pain).   Marland Kitchen HYDROcodone-acetaminophen (NORCO) 10-325 MG per tablet Take 1 tablet by mouth every 8 (eight) hours as needed (pain).   Marland Kitchen lansoprazole (PREVACID) 30 MG capsule TAKE (1) CAPSULE BY MOUTH TWICE DAILY.  Marland Kitchen levothyroxine (SYNTHROID, LEVOTHROID) 100 MCG tablet Take 100 mcg by mouth daily before breakfast.   . lidocaine (LIDODERM) 5 % Place 1-3 patches onto the skin daily as needed (for pain). Remove & Discard patch within 12 hours or as directed by MD for pain  . losartan (COZAAR) 50 MG tablet Take 50 mg by mouth daily.  . Magnesium 250 MG TABS Take 250 mg by mouth every evening.   . Multiple Vitamin (MULTIVITAMIN WITH MINERALS) TABS Take 1 tablet by mouth every evening.   . Omega-3 Fatty Acids (FISH OIL) 1200 MG CAPS Take 1,200 mg by mouth every evening.   Marland Kitchen OVER THE COUNTER MEDICATION Sooth XP Eye drops uses four times daily.  . pravastatin (PRAVACHOL) 80 MG tablet Take 80 mg by mouth every evening.   . propranolol (INDERAL) 20 MG tablet Take 1  tablet (20 mg total) by mouth 3 (three) times daily.  Marland Kitchen pyridostigmine (MESTINON) 60 MG tablet Take 1 tablet (60 mg total) by mouth 4 (four) times daily.  . SUMAtriptan (IMITREX) 50 MG tablet Take 50 mg by mouth every 2 (two) hours as needed. For migraines  . Probiotic Product (ALIGN PO) Take 1 capsule by mouth every evening.    No facility-administered encounter medications on file as of 07/03/2017.      Objective: Blood pressure 108/60, pulse (!) 53, temperature 97.6 F (36.4 C), temperature source Oral, resp. rate 18, height 5\' 3"  (1.6 m), weight 194 lb 14.4 oz (88.4 kg). Patient is alert and in no acute distress. Conjunctiva is pink. Sclera is nonicteric Oropharyngeal mucosa is  normal. No neck masses or thyromegaly noted. Cardiac exam with regular rhythm normal S1 and S2. No murmur or gallop noted. Lungs are clear to auscultation. Abdomen is full.  On palpation is soft and nontender without organomegaly or masses. No LE edema or clubbing noted.    Assessment:  #1.  Chronic GERD.  Typical symptoms are well controlled.  She is having atypical chest pain which may or may not be due to GERD.  #2.  Atypical chest pain.  Pain may or may not be due to GERD.  While appointment with cardiologist is pending we will increase lansoprazole to twice a day and see if she gets better.   Plan:  Increase lansoprazole to 30 mg p.o. twice daily for 8 weeks. Patient to let her cardiologist know that she is having intermittent chest pain. Patient will call with progress report in 8 weeks. Follow-up in 1 year.

## 2017-07-10 DIAGNOSIS — H2513 Age-related nuclear cataract, bilateral: Secondary | ICD-10-CM | POA: Diagnosis not present

## 2017-07-12 ENCOUNTER — Ambulatory Visit (INDEPENDENT_AMBULATORY_CARE_PROVIDER_SITE_OTHER): Payer: Medicare Other | Admitting: Internal Medicine

## 2017-07-12 ENCOUNTER — Encounter: Payer: Self-pay | Admitting: Internal Medicine

## 2017-07-12 ENCOUNTER — Encounter: Payer: Self-pay | Admitting: *Deleted

## 2017-07-12 VITALS — BP 128/72 | HR 52 | Ht 64.0 in | Wt 196.0 lb

## 2017-07-12 DIAGNOSIS — M25551 Pain in right hip: Secondary | ICD-10-CM | POA: Diagnosis not present

## 2017-07-12 DIAGNOSIS — I480 Paroxysmal atrial fibrillation: Secondary | ICD-10-CM | POA: Diagnosis not present

## 2017-07-12 DIAGNOSIS — Z01810 Encounter for preprocedural cardiovascular examination: Secondary | ICD-10-CM | POA: Diagnosis not present

## 2017-07-12 DIAGNOSIS — M25511 Pain in right shoulder: Secondary | ICD-10-CM | POA: Diagnosis not present

## 2017-07-12 NOTE — Patient Instructions (Signed)
Medication Instructions:  Your physician recommends that you continue on your current medications as directed. Please refer to the Current Medication list given to you today.   Labwork: Your physician recommends that you return for lab work in: Tomorrow    Testing/Procedures: Your physician has recommended that you have a pacemaker inserted. A pacemaker is a small device that is placed under the skin of your chest or abdomen to help control abnormal heart rhythms. This device uses electrical pulses to prompt the heart to beat at a normal rate. Pacemakers are used to treat heart rhythms that are too slow. Wire (leads) are attached to the pacemaker that goes into the chambers of you heart. This is done in the hospital and usually requires and overnight stay. Please see the instruction sheet given to you today for more information.     Follow-Up: Your physician recommends that you schedule a follow-up appointment with Dr. Lovena Le in 91 days after Pacemaker is placed.    Any Other Special Instructions Will Be Listed Below (If Applicable).     If you need a refill on your cardiac medications before your next appointment, please call your pharmacy. Thank you for choosing [facility]!

## 2017-07-12 NOTE — H&P (View-Only) (Signed)
HPI [name] is referred today for evaluation of symptomatic tachy-brady syndrome. She has a long h/o palpitations and has had documented atrial fib with a RVR. She has also had HA's and takes propranolol for both the atrial fib and HA's. She has begun experiencing dizzy spells and wore a cardiac monitor which demonstrates atrial fib with a RVR and sinus bradycardia with HR's in the low 30's associated with dizziness. She has had associated sob.  Allergies  Allergen Reactions  . Codeine Itching    Can not take Codeine unless in cough syrup Can not take Codeine unless in cough syrup  . Oxycodone-Acetaminophen     Severe nausea Severe nausea  . Tylox [Oxycodone-Acetaminophen]     Severe nausea  . Ampicillin Rash  . Avocado Diarrhea and Nausea And Vomiting  . Biaxin [Clarithromycin] Rash  . Doxycycline Monohydrate Nausea And Vomiting  . Macrodantin [Nitrofurantoin Macrocrystal] Nausea And Vomiting  . Tetracyclines & Related Nausea And Vomiting  . Moxifloxacin     Contraindication myasthenia gravis     Current Outpatient Medications  Medication Sig Dispense Refill  . apixaban (ELIQUIS) 5 MG TABS tablet Take 1 tablet (5 mg total) by mouth 2 (two) times daily. 180 tablet 3  . Calcium Carbonate-Vit D-Min (CALTRATE 600+D PLUS MINERALS PO) Take 300 mg by mouth daily.     . Coenzyme Q10 (CO Q 10) 10 MG CAPS Take 10 mg by mouth every evening.     . Cyanocobalamin (B-12) 3000 MCG SUBL Place 1 tablet under the tongue every evening. Time release    . diclofenac sodium (VOLTAREN) 1 % GEL Apply 4 g topically 4 (four) times daily as needed (for pain).     Marland Kitchen HYDROcodone-acetaminophen (NORCO) 10-325 MG per tablet Take 1 tablet by mouth every 8 (eight) hours as needed (pain).     Marland Kitchen lansoprazole (PREVACID) 30 MG capsule TAKE (1) CAPSULE BY MOUTH TWICE DAILY. 180 capsule 3  . levothyroxine (SYNTHROID, LEVOTHROID) 100 MCG tablet Take 100 mcg by mouth daily before breakfast.     . lidocaine  (LIDODERM) 5 % Place 1-3 patches onto the skin daily as needed (for pain). Remove & Discard patch within 12 hours or as directed by MD for pain    . losartan (COZAAR) 50 MG tablet Take 50 mg by mouth daily.    . Magnesium 250 MG TABS Take 250 mg by mouth every evening.     . Multiple Vitamin (MULTIVITAMIN WITH MINERALS) TABS Take 1 tablet by mouth every evening.     . Omega-3 Fatty Acids (FISH OIL) 1200 MG CAPS Take 1,200 mg by mouth every evening.     Marland Kitchen OVER THE COUNTER MEDICATION Place 1 drop into both eyes 4 (four) times daily. Sooth XP Eye drops uses four times daily.     . pravastatin (PRAVACHOL) 80 MG tablet Take 80 mg by mouth every evening.     . propranolol (INDERAL) 20 MG tablet Take 1 tablet (20 mg total) by mouth 3 (three) times daily. 270 tablet 3  . pyridostigmine (MESTINON) 60 MG tablet Take 60 mg by mouth 2 (two) times daily.    . SUMAtriptan (IMITREX) 50 MG tablet Take 50 mg by mouth every 2 (two) hours as needed. For migraines    . ALPRAZolam (XANAX) 1 MG tablet Take 1 mg by mouth at bedtime.     No current facility-administered medications for this visit.      Past Medical History:  Diagnosis Date  .  Anemia   . Chronic back pain   . Chronic nausea   . Complication of anesthesia    Low blood pressure, heart rate, O2 sat  . Depression with anxiety   . Depression with anxiety   . Diverticulosis   . Essential hypertension   . GERD (gastroesophageal reflux disease)   . History of pneumonia   . Hypothyroidism   . IBS (irritable colon syndrome)   . LVH (left ventricular hypertrophy)   . Migraines   . Mixed hyperlipidemia   . Obesity   . Ocular myasthenia gravis ([place])   . Osteoarthritis of knee   . PAF (paroxysmal atrial fibrillation) (HCC)    chads2vasc score is at least 4  . Paraganglioma ([place])    Resection 02/2012 (retroperitoneal)  . Paraganglioma, malignant ([place]) 11/06/2015  . Renal insufficiency   . Skin cancer     ROS:   All systems reviewed and  negative except as noted in the HPI.   Past Surgical History:  Procedure Laterality Date  . ABDOMINAL HYSTERECTOMY    . ABDOMINAL HYSTERECTOMY    . CARDIAC CATHETERIZATION    . CHOLECYSTECTOMY    . COLONOSCOPY  08/24/2011   Procedure: COLONOSCOPY;  Surgeon: Rogene Houston, MD;  [facility];  Service: Endoscopy;  Laterality: N/A;  1200  . CT guided biopsy  01/16/12  . GIVENS CAPSULE STUDY  01/29/2012   Procedure: GIVENS CAPSULE STUDY;  Surgeon: Rogene Houston, MD;  [facility];  Service: Endoscopy;  Laterality: N/A;  730  . KNEE SURGERY    . LAPAROTOMY  03/01/2012   Procedure: EXPLORATORY LAPAROTOMY;  Surgeon: Earnstine Regal, MD;  Location: [facility];  Service: General;  Laterality: N/A;  Exploratory Laparotomy ,Resection Retroperitoneal Mass  . NASAL SINUS SURGERY     30 yrs ago  . TOTAL KNEE ARTHROPLASTY Bilateral    2005 and 2011     Family History  Problem Relation Age of Onset  . Inflammatory bowel disease Maternal Grandmother      Social History   Socioeconomic History  . Marital status: Widowed    Spouse name: Not on file  . Number of children: Not on file  . Years of education: Not on file  . Highest education level: Not on file  Occupational History  . Occupation: Retired Quarry manager  Social Needs  . Financial resource strain: Not on file  . Food insecurity:    Worry: Not on file    Inability: Not on file  . Transportation needs:    Medical: Not on file    Non-medical: Not on file  Tobacco Use  . Smoking status: Never Smoker  . Smokeless tobacco: Never Used  Substance and Sexual Activity  . Alcohol use: No    Alcohol/week: 0.0 oz  . Drug use: No  . Sexual activity: Not on file  Lifestyle  . Physical activity:    Days per week: Not on file    Minutes per session: Not on file  . Stress: Not on file  Relationships  . Social connections:    Talks on phone: Not on file    Gets together: Not on file    Attends religious service: Not  on file    Active member of club or organization: Not on file    Attends meetings of clubs or organizations: Not on file    Relationship status: Not on file  . Intimate partner violence:    Fear of current or  ex partner: Not on file    Emotionally abused: Not on file    Physically abused: Not on file    Forced sexual activity: Not on file  Other Topics Concern  . Not on file  Social History Narrative   Lives in [place] alone.  Retired Quarry manager.   Caffeine use:    Right handed     BP 128/72   Pulse (!) 52   Ht 5\' 4"  (1.626 m)   Wt 196 lb (88.9 kg)   SpO2 94%   BMI 33.64 kg/m   Physical Exam:  Well appearing elderly woman, NAD HEENT: Unremarkable Neck:  6 cm JVD, no thyromegally Lymphatics:  No adenopathy Back:  No CVA tenderness Lungs:  Clear with no wheezes HEART:  Regular rate rhythm, no murmurs, no rubs, no clicks Abd:  soft, positive bowel sounds, no organomegally, no rebound, no guarding Ext:  2 plus pulses, no edema, no cyanosis, no clubbing Skin:  No rashes no nodules Neuro:  CN II through XII intact, motor grossly intact  EKG - reviewed - sinus bradycardia    Assess/Plan: 1. Symptomatic tachy-brady syndrome - I have reviewed the findings of her cardiac monitor and recommended insertion of a PPM. We can then uptitrate her medical therapy. I have discussed the indications/risks/benefits/goals/expectations of the procedure and she wishes to proceed. 2. PAF - she is tolerating systemic anti-coagulation without bleeding. She has an RVR when she is in atrial fib and is symptomatic. Might consider AA drug therapy after her PPM is inserted. 3. HTN - she is well controlled on her medical therapy. Will follow.  Mikle Bosworth.D.

## 2017-07-12 NOTE — Progress Notes (Signed)
HPI [name] is referred today for evaluation of symptomatic tachy-brady syndrome. She has a long h/o palpitations and has had documented atrial fib with a RVR. She has also had HA's and takes propranolol for both the atrial fib and HA's. She has begun experiencing dizzy spells and wore a cardiac monitor which demonstrates atrial fib with a RVR and sinus bradycardia with HR's in the low 30's associated with dizziness. She has had associated sob.  Allergies  Allergen Reactions  . Codeine Itching    Can not take Codeine unless in cough syrup Can not take Codeine unless in cough syrup  . Oxycodone-Acetaminophen     Severe nausea Severe nausea  . Tylox [Oxycodone-Acetaminophen]     Severe nausea  . Ampicillin Rash  . Avocado Diarrhea and Nausea And Vomiting  . Biaxin [Clarithromycin] Rash  . Doxycycline Monohydrate Nausea And Vomiting  . Macrodantin [Nitrofurantoin Macrocrystal] Nausea And Vomiting  . Tetracyclines & Related Nausea And Vomiting  . Moxifloxacin     Contraindication myasthenia gravis     Current Outpatient Medications  Medication Sig Dispense Refill  . apixaban (ELIQUIS) 5 MG TABS tablet Take 1 tablet (5 mg total) by mouth 2 (two) times daily. 180 tablet 3  . Calcium Carbonate-Vit D-Min (CALTRATE 600+D PLUS MINERALS PO) Take 300 mg by mouth daily.     . Coenzyme Q10 (CO Q 10) 10 MG CAPS Take 10 mg by mouth every evening.     . Cyanocobalamin (B-12) 3000 MCG SUBL Place 1 tablet under the tongue every evening. Time release    . diclofenac sodium (VOLTAREN) 1 % GEL Apply 4 g topically 4 (four) times daily as needed (for pain).     Marland Kitchen HYDROcodone-acetaminophen (NORCO) 10-325 MG per tablet Take 1 tablet by mouth every 8 (eight) hours as needed (pain).     Marland Kitchen lansoprazole (PREVACID) 30 MG capsule TAKE (1) CAPSULE BY MOUTH TWICE DAILY. 180 capsule 3  . levothyroxine (SYNTHROID, LEVOTHROID) 100 MCG tablet Take 100 mcg by mouth daily before breakfast.     . lidocaine  (LIDODERM) 5 % Place 1-3 patches onto the skin daily as needed (for pain). Remove & Discard patch within 12 hours or as directed by MD for pain    . losartan (COZAAR) 50 MG tablet Take 50 mg by mouth daily.    . Magnesium 250 MG TABS Take 250 mg by mouth every evening.     . Multiple Vitamin (MULTIVITAMIN WITH MINERALS) TABS Take 1 tablet by mouth every evening.     . Omega-3 Fatty Acids (FISH OIL) 1200 MG CAPS Take 1,200 mg by mouth every evening.     Marland Kitchen OVER THE COUNTER MEDICATION Place 1 drop into both eyes 4 (four) times daily. Sooth XP Eye drops uses four times daily.     . pravastatin (PRAVACHOL) 80 MG tablet Take 80 mg by mouth every evening.     . propranolol (INDERAL) 20 MG tablet Take 1 tablet (20 mg total) by mouth 3 (three) times daily. 270 tablet 3  . pyridostigmine (MESTINON) 60 MG tablet Take 60 mg by mouth 2 (two) times daily.    . SUMAtriptan (IMITREX) 50 MG tablet Take 50 mg by mouth every 2 (two) hours as needed. For migraines    . ALPRAZolam (XANAX) 1 MG tablet Take 1 mg by mouth at bedtime.     No current facility-administered medications for this visit.      Past Medical History:  Diagnosis Date  .  Anemia   . Chronic back pain   . Chronic nausea   . Complication of anesthesia    Low blood pressure, heart rate, O2 sat  . Depression with anxiety   . Depression with anxiety   . Diverticulosis   . Essential hypertension   . GERD (gastroesophageal reflux disease)   . History of pneumonia   . Hypothyroidism   . IBS (irritable colon syndrome)   . LVH (left ventricular hypertrophy)   . Migraines   . Mixed hyperlipidemia   . Obesity   . Ocular myasthenia gravis ([place])   . Osteoarthritis of knee   . PAF (paroxysmal atrial fibrillation) (HCC)    chads2vasc score is at least 4  . Paraganglioma ([place])    Resection 02/2012 (retroperitoneal)  . Paraganglioma, malignant ([place]) 11/06/2015  . Renal insufficiency   . Skin cancer     ROS:   All systems reviewed and  negative except as noted in the HPI.   Past Surgical History:  Procedure Laterality Date  . ABDOMINAL HYSTERECTOMY    . ABDOMINAL HYSTERECTOMY    . CARDIAC CATHETERIZATION    . CHOLECYSTECTOMY    . COLONOSCOPY  08/24/2011   Procedure: COLONOSCOPY;  Surgeon: Rogene Houston, MD;  [facility];  Service: Endoscopy;  Laterality: N/A;  1200  . CT guided biopsy  01/16/12  . GIVENS CAPSULE STUDY  01/29/2012   Procedure: GIVENS CAPSULE STUDY;  Surgeon: Rogene Houston, MD;  [facility];  Service: Endoscopy;  Laterality: N/A;  730  . KNEE SURGERY    . LAPAROTOMY  03/01/2012   Procedure: EXPLORATORY LAPAROTOMY;  Surgeon: Earnstine Regal, MD;  [facility];  Service: General;  Laterality: N/A;  Exploratory Laparotomy ,Resection Retroperitoneal Mass  . NASAL SINUS SURGERY     30 yrs ago  . TOTAL KNEE ARTHROPLASTY Bilateral    2005 and 2011     Family History  Problem Relation Age of Onset  . Inflammatory bowel disease Maternal Grandmother      Social History   Socioeconomic History  . Marital status: Widowed    Spouse name: Not on file  . Number of children: Not on file  . Years of education: Not on file  . Highest education level: Not on file  Occupational History  . Occupation: Retired Quarry manager  Social Needs  . Financial resource strain: Not on file  . Food insecurity:    Worry: Not on file    Inability: Not on file  . Transportation needs:    Medical: Not on file    Non-medical: Not on file  Tobacco Use  . Smoking status: Never Smoker  . Smokeless tobacco: Never Used  Substance and Sexual Activity  . Alcohol use: No    Alcohol/week: 0.0 oz  . Drug use: No  . Sexual activity: Not on file  Lifestyle  . Physical activity:    Days per week: Not on file    Minutes per session: Not on file  . Stress: Not on file  Relationships  . Social connections:    Talks on phone: Not on file    Gets together: Not on file    Attends religious service: Not  on file    Active member of club or organization: Not on file    Attends meetings of clubs or organizations: Not on file    Relationship status: Not on file  . Intimate partner violence:    Fear of current or  ex partner: Not on file    Emotionally abused: Not on file    Physically abused: Not on file    Forced sexual activity: Not on file  Other Topics Concern  . Not on file  Social History Narrative   Lives in [place] alone.  Retired Quarry manager.   Caffeine use:    Right handed     BP 128/72   Pulse (!) 52   Ht 5\' 4"  (1.626 m)   Wt 196 lb (88.9 kg)   SpO2 94%   BMI 33.64 kg/m   Physical Exam:  Well appearing elderly woman, NAD HEENT: Unremarkable Neck:  6 cm JVD, no thyromegally Lymphatics:  No adenopathy Back:  No CVA tenderness Lungs:  Clear with no wheezes HEART:  Regular rate rhythm, no murmurs, no rubs, no clicks Abd:  soft, positive bowel sounds, no organomegally, no rebound, no guarding Ext:  2 plus pulses, no edema, no cyanosis, no clubbing Skin:  No rashes no nodules Neuro:  CN II through XII intact, motor grossly intact  EKG - reviewed - sinus bradycardia    Assess/Plan: 1. Symptomatic tachy-brady syndrome - I have reviewed the findings of her cardiac monitor and recommended insertion of a PPM. We can then uptitrate her medical therapy. I have discussed the indications/risks/benefits/goals/expectations of the procedure and she wishes to proceed. 2. PAF - she is tolerating systemic anti-coagulation without bleeding. She has an RVR when she is in atrial fib and is symptomatic. Might consider AA drug therapy after her PPM is inserted. 3. HTN - she is well controlled on her medical therapy. Will follow.  Mikle Bosworth.D.

## 2017-07-13 ENCOUNTER — Other Ambulatory Visit (HOSPITAL_COMMUNITY)
Admission: RE | Admit: 2017-07-13 | Discharge: 2017-07-13 | Disposition: A | Discharge status: Home / Self Care | Payer: Medicare Other | Source: Ambulatory Visit | Attending: Internal Medicine | Admitting: Internal Medicine

## 2017-07-13 ENCOUNTER — Telehealth: Payer: Self-pay | Admitting: Internal Medicine

## 2017-07-13 DIAGNOSIS — Z01810 Encounter for preprocedural cardiovascular examination: Secondary | ICD-10-CM | POA: Diagnosis not present

## 2017-07-13 LAB — BASIC METABOLIC PANEL
ANION GAP: 6 (ref 5–15)
BUN: 21 mg/dL — ABNORMAL HIGH (ref 6–20)
CALCIUM: 9.5 mg/dL (ref 8.9–10.3)
CO2: 25 mmol/L (ref 22–32)
Chloride: 107 mmol/L (ref 101–111)
Creatinine, Ser: 0.88 mg/dL (ref 0.44–1.00)
Glucose, Bld: 115 mg/dL — ABNORMAL HIGH (ref 65–99)
Potassium: 4.3 mmol/L (ref 3.5–5.1)
SODIUM: 138 mmol/L (ref 135–145)

## 2017-07-13 LAB — CBC WITH DIFFERENTIAL/PLATELET
BASOS ABS: 0 10*3/uL (ref 0.0–0.1)
BASOS PCT: 0 %
Eosinophils Absolute: 0 10*3/uL (ref 0.0–0.7)
Eosinophils Relative: 0 %
HCT: 38.3 % (ref 36.0–46.0)
HEMOGLOBIN: 12.6 g/dL (ref 12.0–15.0)
Lymphocytes Relative: 15 %
Lymphs Abs: 1.6 10*3/uL (ref 0.7–4.0)
MCH: 32.5 pg (ref 26.0–34.0)
MCHC: 32.9 g/dL (ref 30.0–36.0)
MCV: 98.7 fL (ref 78.0–100.0)
MONOS PCT: 6 %
Monocytes Absolute: 0.7 10*3/uL (ref 0.1–1.0)
NEUTROS PCT: 79 %
Neutro Abs: 8.3 10*3/uL — ABNORMAL HIGH (ref 1.7–7.7)
Platelets: 157 10*3/uL (ref 150–400)
RBC: 3.88 MIL/uL (ref 3.87–5.11)
RDW: 13.1 % (ref 11.5–15.5)
WBC: 10.5 10*3/uL (ref 4.0–10.5)

## 2017-07-13 NOTE — Telephone Encounter (Signed)
Please call pt's daughter and let her know if pt can have her labs done on Monday

## 2017-07-13 NOTE — Telephone Encounter (Signed)
Daughter will get lab work today

## 2017-07-16 ENCOUNTER — Ambulatory Visit (HOSPITAL_COMMUNITY)
Admission: RE | Admit: 2017-07-16 | Discharge: 2017-07-16 | Disposition: A | Discharge status: Home / Self Care | Payer: Medicare Other | Source: Ambulatory Visit | Attending: Internal Medicine | Admitting: Internal Medicine

## 2017-07-16 DIAGNOSIS — Z780 Asymptomatic menopausal state: Secondary | ICD-10-CM | POA: Diagnosis not present

## 2017-07-16 DIAGNOSIS — Z13820 Encounter for screening for osteoporosis: Secondary | ICD-10-CM | POA: Diagnosis not present

## 2017-07-18 ENCOUNTER — Encounter (HOSPITAL_COMMUNITY)
Admission: RE | Disposition: A | Discharge status: Home / Self Care | Payer: Self-pay | Source: Ambulatory Visit | Attending: Internal Medicine

## 2017-07-18 ENCOUNTER — Observation Stay (HOSPITAL_COMMUNITY)
Admission: RE | Admit: 2017-07-18 | Discharge: 2017-07-19 | Disposition: A | Discharge status: Home / Self Care | Payer: Medicare Other | Source: Ambulatory Visit | Attending: Internal Medicine | Admitting: Internal Medicine

## 2017-07-18 DIAGNOSIS — Z96653 Presence of artificial knee joint, bilateral: Secondary | ICD-10-CM | POA: Diagnosis not present

## 2017-07-18 DIAGNOSIS — Z9049 Acquired absence of other specified parts of digestive tract: Secondary | ICD-10-CM | POA: Diagnosis not present

## 2017-07-18 DIAGNOSIS — G43909 Migraine, unspecified, not intractable, without status migrainosus: Secondary | ICD-10-CM | POA: Diagnosis not present

## 2017-07-18 DIAGNOSIS — E669 Obesity, unspecified: Secondary | ICD-10-CM | POA: Diagnosis not present

## 2017-07-18 DIAGNOSIS — F418 Other specified anxiety disorders: Secondary | ICD-10-CM | POA: Diagnosis not present

## 2017-07-18 DIAGNOSIS — M179 Osteoarthritis of knee, unspecified: Secondary | ICD-10-CM | POA: Diagnosis not present

## 2017-07-18 DIAGNOSIS — Z79890 Hormone replacement therapy (postmenopausal): Secondary | ICD-10-CM | POA: Diagnosis not present

## 2017-07-18 DIAGNOSIS — R002 Palpitations: Secondary | ICD-10-CM | POA: Diagnosis present

## 2017-07-18 DIAGNOSIS — Z79899 Other long term (current) drug therapy: Secondary | ICD-10-CM | POA: Insufficient documentation

## 2017-07-18 DIAGNOSIS — Z885 Allergy status to narcotic agent status: Secondary | ICD-10-CM | POA: Diagnosis not present

## 2017-07-18 DIAGNOSIS — Z85828 Personal history of other malignant neoplasm of skin: Secondary | ICD-10-CM | POA: Insufficient documentation

## 2017-07-18 DIAGNOSIS — Z90710 Acquired absence of both cervix and uterus: Secondary | ICD-10-CM | POA: Insufficient documentation

## 2017-07-18 DIAGNOSIS — K219 Gastro-esophageal reflux disease without esophagitis: Secondary | ICD-10-CM | POA: Insufficient documentation

## 2017-07-18 DIAGNOSIS — E039 Hypothyroidism, unspecified: Secondary | ICD-10-CM | POA: Insufficient documentation

## 2017-07-18 DIAGNOSIS — Z6833 Body mass index (BMI) 33.0-33.9, adult: Secondary | ICD-10-CM | POA: Insufficient documentation

## 2017-07-18 DIAGNOSIS — E782 Mixed hyperlipidemia: Secondary | ICD-10-CM | POA: Diagnosis not present

## 2017-07-18 DIAGNOSIS — G7000 Myasthenia gravis without (acute) exacerbation: Secondary | ICD-10-CM | POA: Diagnosis not present

## 2017-07-18 DIAGNOSIS — Z880 Allergy status to penicillin: Secondary | ICD-10-CM | POA: Diagnosis not present

## 2017-07-18 DIAGNOSIS — Z7902 Long term (current) use of antithrombotics/antiplatelets: Secondary | ICD-10-CM | POA: Insufficient documentation

## 2017-07-18 DIAGNOSIS — K589 Irritable bowel syndrome without diarrhea: Secondary | ICD-10-CM | POA: Diagnosis not present

## 2017-07-18 DIAGNOSIS — I480 Paroxysmal atrial fibrillation: Secondary | ICD-10-CM | POA: Insufficient documentation

## 2017-07-18 DIAGNOSIS — Z9889 Other specified postprocedural states: Secondary | ICD-10-CM | POA: Insufficient documentation

## 2017-07-18 DIAGNOSIS — Z950 Presence of cardiac pacemaker: Secondary | ICD-10-CM

## 2017-07-18 DIAGNOSIS — Z91018 Allergy to other foods: Secondary | ICD-10-CM | POA: Insufficient documentation

## 2017-07-18 DIAGNOSIS — I10 Essential (primary) hypertension: Secondary | ICD-10-CM | POA: Diagnosis not present

## 2017-07-18 DIAGNOSIS — Z881 Allergy status to other antibiotic agents status: Secondary | ICD-10-CM | POA: Insufficient documentation

## 2017-07-18 DIAGNOSIS — I495 Sick sinus syndrome: Secondary | ICD-10-CM | POA: Diagnosis not present

## 2017-07-18 DIAGNOSIS — Z8379 Family history of other diseases of the digestive system: Secondary | ICD-10-CM | POA: Insufficient documentation

## 2017-07-18 DIAGNOSIS — Z7901 Long term (current) use of anticoagulants: Secondary | ICD-10-CM | POA: Insufficient documentation

## 2017-07-18 HISTORY — DX: Presence of cardiac pacemaker: Z95.0

## 2017-07-18 HISTORY — DX: Dyspnea, unspecified: R06.00

## 2017-07-18 HISTORY — PX: PACEMAKER IMPLANT: EP1218

## 2017-07-18 LAB — SURGICAL PCR SCREEN
MRSA, PCR: NEGATIVE
STAPHYLOCOCCUS AUREUS: NEGATIVE

## 2017-07-18 SURGERY — PACEMAKER IMPLANT

## 2017-07-18 MED ORDER — YOU HAVE A PACEMAKER BOOK
Freq: Once | Status: AC
  Administered 2017-07-19: 02:00:00
  Filled 2017-07-18: qty 1

## 2017-07-18 MED ORDER — MIDAZOLAM HCL 5 MG/5ML IJ SOLN
INTRAMUSCULAR | Status: AC
  Filled 2017-07-18: qty 5

## 2017-07-18 MED ORDER — SODIUM CHLORIDE 0.9 % IV SOLN
INTRAVENOUS | Status: AC
  Filled 2017-07-18: qty 2

## 2017-07-18 MED ORDER — VANCOMYCIN HCL IN DEXTROSE 1-5 GM/200ML-% IV SOLN
1000.0000 mg | Freq: Two times a day (BID) | INTRAVENOUS | Status: AC
  Administered 2017-07-19: 1000 mg via INTRAVENOUS
  Filled 2017-07-18: qty 200

## 2017-07-18 MED ORDER — VANCOMYCIN HCL IN DEXTROSE 1-5 GM/200ML-% IV SOLN
1000.0000 mg | INTRAVENOUS | Status: AC
  Administered 2017-07-18: 1000 mg via INTRAVENOUS

## 2017-07-18 MED ORDER — SODIUM CHLORIDE 0.9 % IV SOLN
80.0000 mg | INTRAVENOUS | Status: AC
  Administered 2017-07-18: 80 mg

## 2017-07-18 MED ORDER — HEPARIN (PORCINE) IN NACL 1000-0.9 UT/500ML-% IV SOLN
INTRAVENOUS | Status: AC
  Filled 2017-07-18: qty 500

## 2017-07-18 MED ORDER — MIDAZOLAM HCL 5 MG/5ML IJ SOLN
INTRAMUSCULAR | Status: DC | PRN
  Administered 2017-07-18 (×4): 1 mg via INTRAVENOUS

## 2017-07-18 MED ORDER — LIDOCAINE HCL (PF) 1 % IJ SOLN
INTRAMUSCULAR | Status: AC
  Filled 2017-07-18: qty 60

## 2017-07-18 MED ORDER — VANCOMYCIN HCL IN DEXTROSE 1-5 GM/200ML-% IV SOLN
INTRAVENOUS | Status: AC
  Filled 2017-07-18: qty 200

## 2017-07-18 MED ORDER — FENTANYL CITRATE (PF) 100 MCG/2ML IJ SOLN
INTRAMUSCULAR | Status: AC
  Filled 2017-07-18: qty 2

## 2017-07-18 MED ORDER — MUPIROCIN 2 % EX OINT
TOPICAL_OINTMENT | CUTANEOUS | Status: AC
  Administered 2017-07-18: 1 via TOPICAL
  Filled 2017-07-18: qty 22

## 2017-07-18 MED ORDER — MUPIROCIN 2 % EX OINT
1.0000 "application " | TOPICAL_OINTMENT | Freq: Once | CUTANEOUS | Status: AC
  Administered 2017-07-18: 1 via TOPICAL

## 2017-07-18 MED ORDER — ACETAMINOPHEN 325 MG PO TABS: 325.0000 mg | ORAL_TABLET | ORAL | Status: DC | PRN

## 2017-07-18 MED ORDER — HEPARIN (PORCINE) IN NACL 2-0.9 UNITS/ML
INTRAMUSCULAR | Status: AC | PRN
  Administered 2017-07-18: 500 mL

## 2017-07-18 MED ORDER — ONDANSETRON HCL 4 MG/2ML IJ SOLN: 4.0000 mg | Freq: Four times a day (QID) | INTRAMUSCULAR | Status: DC | PRN

## 2017-07-18 MED ORDER — CHLORHEXIDINE GLUCONATE 4 % EX LIQD: 60.0000 mL | Freq: Once | CUTANEOUS | Status: DC

## 2017-07-18 MED ORDER — HEPARIN SODIUM (PORCINE) 1000 UNIT/ML IJ SOLN
INTRAMUSCULAR | Status: AC
  Filled 2017-07-18: qty 1

## 2017-07-18 MED ORDER — FENTANYL CITRATE (PF) 100 MCG/2ML IJ SOLN
INTRAMUSCULAR | Status: DC | PRN
  Administered 2017-07-18 (×4): 12.5 ug via INTRAVENOUS

## 2017-07-18 MED ORDER — SODIUM CHLORIDE 0.9 % IV SOLN
INTRAVENOUS | Status: DC | PRN
  Administered 2017-07-18: 10 mL/h via INTRAVENOUS

## 2017-07-18 MED ORDER — SODIUM CHLORIDE 0.9 % IV SOLN
INTRAVENOUS | Status: DC
  Administered 2017-07-18: 14:00:00 via INTRAVENOUS

## 2017-07-18 MED ORDER — LIDOCAINE HCL (PF) 1 % IJ SOLN
INTRAMUSCULAR | Status: DC | PRN
  Administered 2017-07-18: 60 mL

## 2017-07-18 SURGICAL SUPPLY — 12 items
ADAPTER SEALING SSSA-09 (ADAPTER) ×2 IMPLANT
CABLE SURGICAL S-101-97-12 (CABLE) ×2 IMPLANT
IPG PACE AZUR XT DR MRI W1DR01 (Pacemaker) IMPLANT
LEAD CAPSURE NOVUS 5076-52CM (Lead) ×2 IMPLANT
LEAD SELECT SECURE 3830 383069 (Lead) IMPLANT
PACE AZURE XT DR MRI W1DR01 (Pacemaker) ×3 IMPLANT
PAD DEFIB LIFELINK (PAD) ×2 IMPLANT
SELECT SECURE 3830 383069 (Lead) ×3 IMPLANT
SHEATH CLASSIC 7F (SHEATH) ×2 IMPLANT
SHEATH CLASSIC 9F (SHEATH) ×2 IMPLANT
SLITTER 6232ADJ (MISCELLANEOUS) ×2 IMPLANT
TRAY PACEMAKER INSERTION (PACKS) ×2 IMPLANT

## 2017-07-18 NOTE — Progress Notes (Signed)
Pt admitted from Cath Lt arm in sling, Lft chest drsg CDI, oriented to unit and routines, VSS, safety discussed pt. Verbalizes understanding

## 2017-07-18 NOTE — Discharge Summary (Addendum)
ELECTROPHYSIOLOGY PROCEDURE DISCHARGE SUMMARY    Patient ID: [name],  MRN: [id number], DOB/AGE: October 25, 1940 77 y.o.  Admit date: 07/18/2017 Discharge date: 07/19/17  Primary Care Physician: [name], MD  Primary Cardiologist: Dr. [name] Electrophysiologist: Dr. Lovena Le  Primary Discharge Diagnosis:  1. Sick Sinus Syndrome  Secondary Discharge Diagnosis:  1. Paroxysmal AFib     CHA2DS2Vasc is 4 on Eliquis 2. HTN 3. Obesity  Allergies  Allergen Reactions  . Ampicillin Rash  . Avocado Diarrhea and Nausea And Vomiting  . Biaxin [Clarithromycin] Rash  . Doxycycline Monohydrate Nausea And Vomiting  . Macrodantin [Nitrofurantoin Macrocrystal] Nausea And Vomiting  . Moxifloxacin     Contraindication myasthenia gravis  . Oxycodone-Acetaminophen     Severe nausea Severe nausea  . Petrolatum-Zinc Oxide Rash  . Tetracyclines & Related Nausea And Vomiting  . Tylox [Oxycodone-Acetaminophen]     Severe nausea  . Codeine Itching    Can not take Codeine unless in cough syrup Can not take Codeine unless in cough syrup     Procedures This Admission:  1.  Implantation of a MDT dual chamber PPM on 07/18/17 by Dr Lovena Le.  The patient received a Medtronic [id number] (serial number [id number]) right atrial lead and a Medtronic [id number] (serial number [id number]) right ventricular lead, Medtronic (serial number [id number]) pacemaker There were no immediate post procedure complications. 2.  CXR on 07/19/17 demonstrated no pneumothorax status post device implantation.   Brief HPI: [name] is a 77 y.o. female is followed outpatient PAFib w/RVR on propanolol, developed dizzy spells, monitoring noted episodes of bradycardia 30's and was recommended PPM implantation.  Past medical history includes above.  Risks, benefits, and alternatives to PPM implantation were reviewed with the patient who wished to proceed.   Hospital Course:  The patient was admitted and underwent implantation of a  PPM with details as outlined above.  She  was monitored on telemetry overnight which demonstrated noting A paced rhythm .  Left chest was without hematoma or ecchymosis.  The device was interrogated and found to be functioning normally.  CXR was obtained and demonstrated no pneumothorax status post device implantation.  Wound care, arm mobility, and restrictions were reviewed with the patient.  The patient feels well day of discharge, no CP or SOB, she was examined by Dr. Lovena Le and considered stable for discharge to home.    Physical Exam: Vitals:   07/18/17 2000 07/18/17 2054 07/18/17 2100 07/19/17 0712  BP:  (!) 118/33 (!) 118/33 (!) 147/55  Pulse: (!) 59 63 60 61  Resp: 19 15 12  (!) 22  Temp:  98.1 F (36.7 C)  98.1 F (36.7 C)  TempSrc:  Oral  Oral  SpO2: 97% 99% 99% 100%  Weight:    191 lb 12.8 oz (87 kg)  Height:        GEN- The patient is well appearing, alert and oriented x 3 today.   HEENT: normocephalic, atraumatic; sclera clear, conjunctiva pink; hearing intact; oropharynx clear; neck supple, no JVP Lungs- CTA b/l, normal work of breathing.  No wheezes, rales, rhonchi Heart- RRR, no murmurs, rubs or gallops, PMI not laterally displaced GI- soft, non-tender, non-distended Extremities- no clubbing, cyanosis, or edema MS- no significant deformity or atrophy Skin- warm and dry, no rash or lesion,  left chest without hematoma/ecchymosis Psych- euthymic mood, full affect Neuro- no gross deficits   Labs:   Lab Results  Component Value Date   WBC 10.5 07/13/2017   HGB  12.6 07/13/2017   HCT 38.3 07/13/2017   MCV 98.7 07/13/2017   PLT 157 07/13/2017    Recent Labs  Lab 07/13/17 1337  NA 138  K 4.3  CL 107  CO2 25  BUN 21*  CREATININE 0.88  CALCIUM 9.5  GLUCOSE 115*    Discharge Medications:  Allergies as of 07/19/2017      Reactions   Ampicillin Rash   Avocado Diarrhea, Nausea And Vomiting   Biaxin [clarithromycin] Rash   Doxycycline Monohydrate Nausea And  Vomiting   Macrodantin [nitrofurantoin Macrocrystal] Nausea And Vomiting   Moxifloxacin    Contraindication myasthenia gravis   Oxycodone-acetaminophen    Severe nausea Severe nausea   Petrolatum-zinc Oxide Rash   Tetracyclines & Related Nausea And Vomiting   Tylox [oxycodone-acetaminophen]    Severe nausea   Codeine Itching   Can not take Codeine unless in cough syrup Can not take Codeine unless in cough syrup      Medication List    TAKE these medications   ALPRAZolam 1 MG tablet Commonly known as:  XANAX Take 1 mg by mouth at bedtime.   apixaban 5 MG Tabs tablet Commonly known as:  ELIQUIS Take 1 tablet (5 mg total) by mouth 2 (two) times daily. Notes to patient:  Do not resume until Saturday, 07/21/17   B-12 3000 MCG Subl Place 1 tablet under the tongue every evening. Time release   CALTRATE 600+D PLUS MINERALS PO Take 300 mg by mouth daily.   Co Q 10 10 MG Caps Take 10 mg by mouth every evening.   diclofenac sodium 1 % Gel Commonly known as:  VOLTAREN Apply 4 g topically 4 (four) times daily as needed (for pain).   Fish Oil 1200 MG Caps Take 1,200 mg by mouth every evening.   HYDROcodone-acetaminophen 10-325 MG tablet Commonly known as:  NORCO Take 1 tablet by mouth every 8 (eight) hours as needed (pain).   lansoprazole 30 MG capsule Commonly known as:  PREVACID TAKE (1) CAPSULE BY MOUTH TWICE DAILY.   levothyroxine 100 MCG tablet Commonly known as:  SYNTHROID, LEVOTHROID Take 100 mcg by mouth daily before breakfast.   lidocaine 5 % Commonly known as:  LIDODERM Place 1-3 patches onto the skin daily as needed (for pain). Remove & Discard patch within 12 hours or as directed by MD for pain   losartan 50 MG tablet Commonly known as:  COZAAR Take 50 mg by mouth daily.   Magnesium 250 MG Tabs Take 250 mg by mouth every evening.   multivitamin with minerals Tabs tablet Take 1 tablet by mouth every evening.   OVER THE COUNTER MEDICATION Place 1 drop  into both eyes 4 (four) times daily. Sooth XP Eye drops uses four times daily.   pravastatin 80 MG tablet Commonly known as:  PRAVACHOL Take 80 mg by mouth every evening.   propranolol 20 MG tablet Commonly known as:  INDERAL Take 1 tablet (20 mg total) by mouth 3 (three) times daily.   pyridostigmine 60 MG tablet Commonly known as:  MESTINON Take 60 mg by mouth 2 (two) times daily.   SUMAtriptan 50 MG tablet Commonly known as:  IMITREX Take 50 mg by mouth every 2 (two) hours as needed. For migraines       Disposition:  Home Discharge Instructions    Diet - low sodium heart healthy   Complete by:  As directed    Increase activity slowly   Complete by:  As directed  Follow-up Information    [facility] Follow up on 08/06/2017.   Specialty:  Cardiology Why:  12:00PM (noon), wound check visit Contact information: [address] [place]       Evans Lance, MD Follow up on 10/31/2017.   Specialty:  Cardiology Why:  9:45AM Contact information: [place] [address] [phone]           Duration of Discharge Encounter: Greater than 30 minutes including physician time.  Venetia Night, PA-C 07/19/2017 8:40 AM  EP attending  Patient seen and examined.  Agree with the findings as noted above.  The patient is status post dual-chamber pacemaker insertion secondary to sinus node dysfunction.  Interrogation of her pacemaker demonstrates normal device function.  Chest x-ray demonstrates no pneumothorax.  She will be discharged home with the usual follow-up.  Cristopher Peru, MD

## 2017-07-18 NOTE — Interval H&P Note (Signed)
History and Physical Interval Note:  07/18/2017 2:29 PM  [name]  has presented today for surgery, with the diagnosis of sinus node dysfunction  The various methods of treatment have been discussed with the patient and family. After consideration of risks, benefits and other options for treatment, the patient has consented to  Procedure(s): PACEMAKER IMPLANT (N/A) as a surgical intervention .  The patient's history has been reviewed, patient examined, no change in status, stable for surgery.  I have reviewed the patient's chart and labs.  Questions were answered to the patient's satisfaction.     Cristopher Peru

## 2017-07-19 ENCOUNTER — Encounter (HOSPITAL_COMMUNITY): Payer: Self-pay | Admitting: Internal Medicine

## 2017-07-19 ENCOUNTER — Observation Stay (HOSPITAL_COMMUNITY): Payer: Medicare Other

## 2017-07-19 ENCOUNTER — Other Ambulatory Visit: Payer: Self-pay

## 2017-07-19 DIAGNOSIS — G43909 Migraine, unspecified, not intractable, without status migrainosus: Secondary | ICD-10-CM | POA: Diagnosis not present

## 2017-07-19 DIAGNOSIS — K219 Gastro-esophageal reflux disease without esophagitis: Secondary | ICD-10-CM | POA: Diagnosis not present

## 2017-07-19 DIAGNOSIS — R002 Palpitations: Secondary | ICD-10-CM | POA: Diagnosis not present

## 2017-07-19 DIAGNOSIS — F418 Other specified anxiety disorders: Secondary | ICD-10-CM | POA: Diagnosis not present

## 2017-07-19 DIAGNOSIS — E669 Obesity, unspecified: Secondary | ICD-10-CM | POA: Diagnosis not present

## 2017-07-19 DIAGNOSIS — J9811 Atelectasis: Secondary | ICD-10-CM | POA: Diagnosis not present

## 2017-07-19 DIAGNOSIS — I10 Essential (primary) hypertension: Secondary | ICD-10-CM | POA: Diagnosis not present

## 2017-07-19 DIAGNOSIS — I495 Sick sinus syndrome: Secondary | ICD-10-CM | POA: Diagnosis not present

## 2017-07-19 DIAGNOSIS — M179 Osteoarthritis of knee, unspecified: Secondary | ICD-10-CM | POA: Diagnosis not present

## 2017-07-19 DIAGNOSIS — E782 Mixed hyperlipidemia: Secondary | ICD-10-CM | POA: Diagnosis not present

## 2017-07-19 DIAGNOSIS — I480 Paroxysmal atrial fibrillation: Secondary | ICD-10-CM | POA: Diagnosis not present

## 2017-07-19 DIAGNOSIS — E039 Hypothyroidism, unspecified: Secondary | ICD-10-CM | POA: Diagnosis not present

## 2017-07-19 DIAGNOSIS — G7000 Myasthenia gravis without (acute) exacerbation: Secondary | ICD-10-CM | POA: Diagnosis not present

## 2017-07-19 NOTE — Discharge Instructions (Signed)
° ° °  Supplemental Discharge Instructions for  Pacemaker/Defibrillator Patients  Activity No heavy lifting or vigorous activity with your left/right arm for 6 to 8 weeks.  Do not raise your left/right arm above your head for one week.  Gradually raise your affected arm as drawn below.              07/22/17                      07/23/17                      07/24/17                    07/25/17 __  NO DRIVING (patient does not drive)  WOUND CARE - Keep the wound area clean and dry.  Do not get this area wet for one week. No showers for one week; you may shower on  07/25/17  . - The tape/steri-strips on your wound will fall off; do not pull them off.  No bandage is needed on the site.  DO  NOT apply any creams, oils, or ointments to the wound area. - If you notice any drainage or discharge from the wound, any swelling or bruising at the site, or you develop a fever > 101? F after you are discharged home, call the office at once.  Special Instructions - You are still able to use cellular telephones; use the ear opposite the side where you have your pacemaker/defibrillator.  Avoid carrying your cellular phone near your device. - When traveling through airports, show security personnel your identification card to avoid being screened in the metal detectors.  Ask the security personnel to use the hand wand. - Avoid arc welding equipment, MRI testing (magnetic resonance imaging), TENS units (transcutaneous nerve stimulators).  Call the office for questions about other devices. - Avoid electrical appliances that are in poor condition or are not properly grounded. - Microwave ovens are safe to be near or to operate.  Additional information for defibrillator patients should your device go off: - If your device goes off ONCE and you feel fine afterward, notify the device clinic nurses. - If your device goes off ONCE and you do not feel well afterward, call 911. - If your device goes off TWICE, call 911. - If  your device goes off THREE times in one day, call 911.  DO NOT DRIVE YOURSELF OR A FAMILY MEMBER WITH A DEFIBRILLATOR TO THE HOSPITAL--CALL 911.

## 2017-07-19 NOTE — Progress Notes (Signed)
Noted pt.took her own meds.from home which were not ordered & pt.said Dr .Lovena Le told her she can take her own meds.from home.

## 2017-07-24 ENCOUNTER — Encounter: Payer: Self-pay | Admitting: Cardiovascular Disease

## 2017-07-24 ENCOUNTER — Ambulatory Visit (INDEPENDENT_AMBULATORY_CARE_PROVIDER_SITE_OTHER): Payer: Medicare Other | Admitting: Cardiovascular Disease

## 2017-07-24 VITALS — BP 118/66 | HR 60 | Ht 64.0 in | Wt 192.0 lb

## 2017-07-24 DIAGNOSIS — E782 Mixed hyperlipidemia: Secondary | ICD-10-CM

## 2017-07-24 DIAGNOSIS — Z950 Presence of cardiac pacemaker: Secondary | ICD-10-CM | POA: Diagnosis not present

## 2017-07-24 DIAGNOSIS — I10 Essential (primary) hypertension: Secondary | ICD-10-CM | POA: Diagnosis not present

## 2017-07-24 DIAGNOSIS — I480 Paroxysmal atrial fibrillation: Secondary | ICD-10-CM

## 2017-07-24 NOTE — Patient Instructions (Addendum)
Your physician recommends that you schedule a follow-up appointment NL:[id number] Dr.Taylor as scheduled.   Pacemaker Wound check is May 20 th at the [facility] all education regarding device will be discussed    Your physician recommends that you continue on your current medications as directed. Please refer to the Current Medication list given to you today.    If you need a refill on your cardiac medications before your next appointment, please call your pharmacy.      No lab work or tests ordered today       Thank you for choosing [facility] !

## 2017-07-24 NOTE — Progress Notes (Signed)
SUBJECTIVE: The patient presents for follow-up after undergoing implantation of an MDT dual-chamber pacemaker on 07/18/2017 by Dr. Lovena Le.  This was implanted for PAF with tachy-brady syndrome.  She is here with her daughter.  Her other 79 year old daughter passed away of an MI in 03-21-17.  The patient is doing well and said her shortness of breath has improved.  She denies chest pain and palpitations.  She has been consistently checking her blood pressure at home with systolic readings routinely under 120.  Her PCP told her to hold her losartan until today.    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  Review of Systems: As per "subjective", otherwise negative.  Allergies  Allergen Reactions  . Ampicillin Rash  . Avocado Diarrhea and Nausea And Vomiting  . Biaxin [Clarithromycin] Rash  . Doxycycline Monohydrate Nausea And Vomiting  . Macrodantin [Nitrofurantoin Macrocrystal] Nausea And Vomiting  . Moxifloxacin     Contraindication myasthenia gravis  . Oxycodone-Acetaminophen     Severe nausea Severe nausea  . Petrolatum-Zinc Oxide Rash  . Tetracyclines & Related Nausea And Vomiting  . Tylox [Oxycodone-Acetaminophen]     Severe nausea  . Codeine Itching    Can not take Codeine unless in cough syrup Can not take Codeine unless in cough syrup    Current Outpatient Medications  Medication Sig Dispense Refill  . ALPRAZolam (XANAX) 1 MG tablet Take 1 mg by mouth at bedtime.    Marland Kitchen apixaban (ELIQUIS) 5 MG TABS tablet Take 1 tablet (5 mg total) by mouth 2 (two) times daily. 180 tablet 3  . cholecalciferol (VITAMIN D) 1000 units tablet Take 1,000 Units by mouth daily.    . Coenzyme Q10 (CO Q 10) 10 MG CAPS Take 10 mg by mouth every evening.     . Cyanocobalamin (B-12) 3000 MCG SUBL Place 1 tablet under the tongue every evening. Time release    . diclofenac sodium (VOLTAREN) 1 % GEL Apply 4 g  topically 4 (four) times daily as needed (for pain).     Marland Kitchen HYDROcodone-acetaminophen (NORCO) 10-325 MG per tablet Take 1 tablet by mouth every 8 (eight) hours as needed (pain).     Marland Kitchen lansoprazole (PREVACID) 30 MG capsule TAKE (1) CAPSULE BY MOUTH TWICE DAILY. 180 capsule 3  . levothyroxine (SYNTHROID, LEVOTHROID) 100 MCG tablet Take 100 mcg by mouth daily before breakfast.     . lidocaine (LIDODERM) 5 % Place 1-3 patches onto the skin daily as needed (for pain). Remove & Discard patch within 12 hours or as directed by MD for pain    . losartan (COZAAR) 50 MG tablet Take 50 mg by mouth daily.    . Magnesium 250 MG TABS Take 250 mg by mouth every evening.     . Omega-3 Fatty Acids (FISH OIL) 1200 MG CAPS Take 1,200 mg by mouth every evening.     Marland Kitchen OVER THE COUNTER MEDICATION Place 1 drop into both eyes 4 (four) times daily. Sooth XP Eye drops uses four times daily.     . pravastatin (PRAVACHOL) 80 MG tablet Take 80 mg by mouth every evening.     . propranolol (INDERAL) 20 MG tablet Take 1 tablet (20 mg total) by mouth 3 (three) times daily. 270 tablet 3  . pyridostigmine (MESTINON) 60 MG tablet Take 60 mg by mouth 2 (two) times daily.    . SUMAtriptan (IMITREX)  50 MG tablet Take 50 mg by mouth every 2 (two) hours as needed. For migraines     No current facility-administered medications for this visit.     Past Medical History:  Diagnosis Date  . Anemia   . Chronic back pain   . Chronic nausea   . Complication of anesthesia    Low blood pressure, heart rate, O2 sat  . Depression with anxiety   . Depression with anxiety   . Diverticulosis   . Dyspnea   . Essential hypertension   . GERD (gastroesophageal reflux disease)   . History of pneumonia   . Hypothyroidism   . IBS (irritable colon syndrome)   . LVH (left ventricular hypertrophy)   . Migraines   . Mixed hyperlipidemia   . Obesity   . Ocular myasthenia gravis ([place])   . Osteoarthritis of knee   . PAF (paroxysmal atrial  fibrillation) (HCC)    chads2vasc score is at least 4  . Paraganglioma ([place])    Resection 02/2012 (retroperitoneal)  . Paraganglioma, malignant ([place]) 11/06/2015  . Presence of permanent cardiac pacemaker 07/18/2017  . Renal insufficiency   . Skin cancer     Past Surgical History:  Procedure Laterality Date  . ABDOMINAL HYSTERECTOMY    . ABDOMINAL HYSTERECTOMY    . CARDIAC CATHETERIZATION    . CHOLECYSTECTOMY    . COLONOSCOPY  08/24/2011   Procedure: COLONOSCOPY;  Surgeon: Rogene Houston, MD;  [facility];  Service: Endoscopy;  Laterality: N/A;  1200  . CT guided biopsy  01/16/12  . GIVENS CAPSULE STUDY  01/29/2012   Procedure: GIVENS CAPSULE STUDY;  Surgeon: Rogene Houston, MD;  [facility];  Service: Endoscopy;  Laterality: N/A;  730  . KNEE SURGERY    . LAPAROTOMY  03/01/2012   Procedure: EXPLORATORY LAPAROTOMY;  Surgeon: Earnstine Regal, MD;  Location: [facility];  Service: General;  Laterality: N/A;  Exploratory Laparotomy ,Resection Retroperitoneal Mass  . NASAL SINUS SURGERY     30 yrs ago  . PACEMAKER IMPLANT N/A 07/18/2017   Procedure: PACEMAKER IMPLANT;  Surgeon: Evans Lance, MD;  Location: [facility];  Service: Cardiovascular;  Laterality: N/A;  . TOTAL KNEE ARTHROPLASTY Bilateral    2005 and 2011    Social History   Socioeconomic History  . Marital status: Widowed    Spouse name: Not on file  . Number of children: Not on file  . Years of education: Not on file  . Highest education level: Not on file  Occupational History  . Occupation: Retired Quarry manager  Social Needs  . Financial resource strain: Not on file  . Food insecurity:    Worry: Not on file    Inability: Not on file  . Transportation needs:    Medical: Not on file    Non-medical: Not on file  Tobacco Use  . Smoking status: Never Smoker  . Smokeless tobacco: Never Used  Substance and Sexual Activity  . Alcohol use: No    Alcohol/week: 0.0 oz  . Drug use: No  .  Sexual activity: Not on file  Lifestyle  . Physical activity:    Days per week: Not on file    Minutes per session: Not on file  . Stress: Not on file  Relationships  . Social connections:    Talks on phone: Not on file    Gets together: Not on file    Attends religious service: Not on file  Active member of club or organization: Not on file    Attends meetings of clubs or organizations: Not on file    Relationship status: Not on file  . Intimate partner violence:    Fear of current or ex partner: Not on file    Emotionally abused: Not on file    Physically abused: Not on file    Forced sexual activity: Not on file  Other Topics Concern  . Not on file  Social History Narrative   Lives in [place] alone.  Retired Quarry manager.   Caffeine use:    Right handed     Vitals:   07/24/17 1023  BP: 118/66  Pulse: 79  SpO2: 96%  Weight: 192 lb (87.1 kg)  Height: 5\' 4"  (1.626 m)    Wt Readings from Last 3 Encounters:  07/24/17 192 lb (87.1 kg)  07/19/17 191 lb 12.8 oz (87 kg)  07/12/17 196 lb (88.9 kg)     PHYSICAL EXAM General: NAD HEENT: Normal. Neck: No JVD, no thyromegaly. Lungs: Clear to auscultation bilaterally with normal respiratory effort. CV: Regular rate and rhythm, normal S1/S2, no S3/S4, no murmur. No pretibial or periankle edema.     Abdomen: Soft, nontender, no distention.  Neurologic: Alert and oriented.  Psych: Normal affect. Skin: Normal. Musculoskeletal: No gross deformities.    ECG: Most recent ECG reviewed.   Labs: Lab Results  Component Value Date/Time   K 4.3 07/13/2017 01:37 PM   BUN 21 (H) 07/13/2017 01:37 PM   CREATININE 0.88 07/13/2017 01:37 PM   CREATININE 1.05 (H) 05/04/2015 03:59 PM   ALT 13 (L) 06/25/2017 03:28 PM   TSH 0.090 (L) 04/28/2014 10:52 AM   HGB 12.6 07/13/2017 01:37 PM     Lipids: No results found for: LDLCALC, LDLDIRECT, CHOL, TRIG, HDL     ASSESSMENT AND PLAN: 1.  Tachy-brady syndrome status post  dual-chamber pacemaker implantation on 07/18/2017: Symptomatically stable.  Follows with Dr. Lovena Le.  She is already scheduled to follow-up in device clinic.  2.  Paroxysmal atrial fibrillation: Symptomatically stable on propranolol. Systemically anticoagulated with Eliquis 5 mg twice daily.  3.  Chronic hypertension: Controlled on present therapy.  She has been holding losartan.  I advised her to continue to do so but if blood pressures become consistently greater than 140/90, to resume losartan at a lower dose of 25 mg daily.  4.  Hyperlipidemia: Continue statin therapy.    Disposition: Follow up with Dr. Lovena Le as scheduled.  Follow-up with me as needed.   Kate Sable, M.D., F.A.C.C.

## 2017-07-31 DIAGNOSIS — Z6829 Body mass index (BMI) 29.0-29.9, adult: Secondary | ICD-10-CM | POA: Diagnosis not present

## 2017-07-31 DIAGNOSIS — I10 Essential (primary) hypertension: Secondary | ICD-10-CM | POA: Diagnosis not present

## 2017-08-01 DIAGNOSIS — H2512 Age-related nuclear cataract, left eye: Secondary | ICD-10-CM | POA: Diagnosis not present

## 2017-08-01 DIAGNOSIS — H25812 Combined forms of age-related cataract, left eye: Secondary | ICD-10-CM | POA: Diagnosis not present

## 2017-08-06 ENCOUNTER — Ambulatory Visit (INDEPENDENT_AMBULATORY_CARE_PROVIDER_SITE_OTHER): Payer: Medicare Other | Admitting: *Deleted

## 2017-08-06 DIAGNOSIS — R001 Bradycardia, unspecified: Secondary | ICD-10-CM

## 2017-08-06 DIAGNOSIS — I480 Paroxysmal atrial fibrillation: Secondary | ICD-10-CM | POA: Diagnosis not present

## 2017-08-06 LAB — CUP PACEART INCLINIC DEVICE CHECK
Battery Remaining Longevity: 143 mo
Battery Voltage: 3.21 V
Brady Statistic RA Percent Paced: 94.52 %
Brady Statistic RV Percent Paced: 0.12 %
Implantable Lead Implant Date: 20190501
Implantable Lead Location: 753860
Implantable Lead Model: 3830
Implantable Pulse Generator Implant Date: 20190501
Lead Channel Impedance Value: 323 Ohm
Lead Channel Impedance Value: 513 Ohm
Lead Channel Pacing Threshold Amplitude: 0.875 V
Lead Channel Pacing Threshold Pulse Width: 0.4 ms
Lead Channel Pacing Threshold Pulse Width: 0.4 ms
Lead Channel Sensing Intrinsic Amplitude: 1.125 mV
Lead Channel Sensing Intrinsic Amplitude: 10.125 mV
Lead Channel Setting Pacing Amplitude: 5 V
Lead Channel Setting Sensing Sensitivity: 0.9 mV
MDC IDC LEAD IMPLANT DT: 20190501
MDC IDC LEAD LOCATION: 753859
MDC IDC MSMT LEADCHNL RA IMPEDANCE VALUE: 418 Ohm
MDC IDC MSMT LEADCHNL RA SENSING INTR AMPL: 1 mV
MDC IDC MSMT LEADCHNL RV IMPEDANCE VALUE: 380 Ohm
MDC IDC MSMT LEADCHNL RV PACING THRESHOLD AMPLITUDE: 0.5 V
MDC IDC MSMT LEADCHNL RV SENSING INTR AMPL: 13.75 mV
MDC IDC SESS DTM: 20190520161922
MDC IDC SET LEADCHNL RA PACING AMPLITUDE: 3.5 V
MDC IDC SET LEADCHNL RV PACING PULSEWIDTH: 1 ms
MDC IDC STAT BRADY AP VP PERCENT: 0.12 %
MDC IDC STAT BRADY AP VS PERCENT: 94.1 %
MDC IDC STAT BRADY AS VP PERCENT: 0 %
MDC IDC STAT BRADY AS VS PERCENT: 5.79 %

## 2017-08-06 NOTE — Progress Notes (Signed)
Wound check appointment. Steri-strips removed. Wound without redness or edema. Incision edges approximated, wound well healed. Normal device function. Thresholds, sensing, and impedances consistent with implant measurements. HBP lead tested with use of rhythm strip - nonselective capture noted from 6V to LOC (per GT). Device programmed at 3.5V(RA)/5V(RV) for extra safety margin until 3 month visit. Histogram distribution appropriate for patient and level of activity. No mode switches or high ventricular rates noted. Patient educated about wound care, arm mobility, lifting restrictions. ROV in 6 weeks with AS, 3 months with GT.

## 2017-08-16 ENCOUNTER — Encounter

## 2017-08-16 ENCOUNTER — Ambulatory Visit: Payer: Medicare Other | Admitting: Cardiovascular Disease

## 2017-09-18 NOTE — Progress Notes (Signed)
Electrophysiology Office Note Date: 09/19/2017  ID:  Pamela, Nolan 1940/06/25, MRN [id number]  PCP: [name], MD Primary Cardiologist: [name] Electrophysiologist: Lovena Le  CC: Pacemaker follow-up  [name] is a 77 y.o. female seen today for Dr Lovena Le.  She presents today for routine electrophysiology followup. She initially did very well post pacemaker implant and had a significant increase in energy.  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 She has shortness of breath with exertion.  She has chronic atypical chest pain.   She denies palpitations, PND, orthopnea, nausea, vomiting, dizziness, syncope, edema, weight gain, or early satiety.  Device History: MDT dual chamber (HIS Bundle) PPM implanted 2019 for SSS   Past Medical History:  Diagnosis Date  . Anemia   . Chronic back pain   . Chronic nausea   . Complication of anesthesia    Low blood pressure, heart rate, O2 sat  . Depression with anxiety   . Depression with anxiety   . Diverticulosis   . Dyspnea   . Essential hypertension   . GERD (gastroesophageal reflux disease)   . History of pneumonia   . Hypothyroidism   . IBS (irritable colon syndrome)   . LVH (left ventricular hypertrophy)   . Migraines   . Mixed hyperlipidemia   . Obesity   . Ocular myasthenia gravis ([place])   . Osteoarthritis of knee   . PAF (paroxysmal atrial fibrillation) (HCC)    chads2vasc score is at least 4  . Paraganglioma ([place])    Resection 02/2012 (retroperitoneal)  . Paraganglioma, malignant ([place]) 11/06/2015  . Presence of permanent cardiac pacemaker 07/18/2017  . Renal insufficiency   . Skin cancer    Past Surgical History:  Procedure Laterality Date  . ABDOMINAL HYSTERECTOMY    . ABDOMINAL HYSTERECTOMY    . CARDIAC CATHETERIZATION    . CHOLECYSTECTOMY    . COLONOSCOPY  08/24/2011   Procedure: COLONOSCOPY;  Surgeon: Rogene Houston, MD;  [facility];  Service: Endoscopy;   Laterality: N/A;  1200  . CT guided biopsy  01/16/12  . GIVENS CAPSULE STUDY  01/29/2012   Procedure: GIVENS CAPSULE STUDY;  Surgeon: Rogene Houston, MD;  [facility];  Service: Endoscopy;  Laterality: N/A;  730  . KNEE SURGERY    . LAPAROTOMY  03/01/2012   Procedure: EXPLORATORY LAPAROTOMY;  Surgeon: Earnstine Regal, MD;  Location[facility];  Service: General;  Laterality: N/A;  Exploratory Laparotomy ,Resection Retroperitoneal Mass  . NASAL SINUS SURGERY     30 yrs ago  . PACEMAKER IMPLANT N/A 07/18/2017   Procedure: PACEMAKER IMPLANT;  Surgeon: Evans Lance, MD;  Location: [place] [facility];  Service: Cardiovascular;  Laterality: N/A;  . TOTAL KNEE ARTHROPLASTY Bilateral    2005 and 2011    Current Outpatient Medications  Medication Sig Dispense Refill  . ALPRAZolam (XANAX) 1 MG tablet Take 1 mg by mouth at bedtime.    Marland Kitchen apixaban (ELIQUIS) 5 MG TABS tablet Take 1 tablet (5 mg total) by mouth 2 (two) times daily. 180 tablet 3  . cholecalciferol (VITAMIN D) 1000 units tablet Take 1,000 Units by mouth daily.    . Coenzyme Q10 (CO Q 10) 10 MG CAPS Take 10 mg by mouth every evening.     . Cyanocobalamin (B-12) 3000 MCG SUBL Place 1 tablet under the tongue every evening. Time release    . diclofenac sodium (VOLTAREN) 1 %  GEL Apply 4 g topically 4 (four) times daily as needed (for pain).     Marland Kitchen HYDROcodone-acetaminophen (NORCO) 10-325 MG per tablet Take 1 tablet by mouth every 8 (eight) hours as needed (pain).     Marland Kitchen lansoprazole (PREVACID) 30 MG capsule TAKE (1) CAPSULE BY MOUTH TWICE DAILY. 180 capsule 3  . levothyroxine (SYNTHROID, LEVOTHROID) 100 MCG tablet Take 100 mcg by mouth daily before breakfast.     . lidocaine (LIDODERM) 5 % Place 1-3 patches onto the skin daily as needed (for pain). Remove & Discard patch within 12 hours or as directed by MD for pain    . Magnesium 250 MG TABS Take 250 mg by mouth every evening.     . Omega-3 Fatty Acids (FISH OIL) 1200 MG CAPS Take  1,200 mg by mouth every evening.     Marland Kitchen OVER THE COUNTER MEDICATION Place 1 drop into both eyes 4 (four) times daily. Sooth XP Eye drops uses four times daily.     . pravastatin (PRAVACHOL) 80 MG tablet Take 80 mg by mouth every evening.     . propranolol (INDERAL) 20 MG tablet Take 1 tablet (20 mg total) by mouth 3 (three) times daily. 270 tablet 3  . pyridostigmine (MESTINON) 60 MG tablet Take 60 mg by mouth 2 (two) times daily.    . SUMAtriptan (IMITREX) 50 MG tablet Take 50 mg by mouth every 2 (two) hours as needed. For migraines    . losartan (COZAAR) 50 MG tablet Take 50 mg by mouth daily.     No current facility-administered medications for this visit.     Allergies:   Ampicillin; Avocado; Biaxin [clarithromycin]; Doxycycline monohydrate; Macrodantin [nitrofurantoin macrocrystal]; Moxifloxacin; Oxycodone-acetaminophen; Petrolatum-zinc oxide; Tetracyclines & related; Tylox [oxycodone-acetaminophen]; and Codeine   Social History: Social History   Socioeconomic History  . Marital status: Widowed    Spouse name: Not on file  . Number of children: Not on file  . Years of education: Not on file  . Highest education level: Not on file  Occupational History  . Occupation: Retired Quarry manager  Social Needs  . Financial resource strain: Not on file  . Food insecurity:    Worry: Not on file    Inability: Not on file  . Transportation needs:    Medical: Not on file    Non-medical: Not on file  Tobacco Use  . Smoking status: Never Smoker  . Smokeless tobacco: Never Used  Substance and Sexual Activity  . Alcohol use: No    Alcohol/week: 0.0 oz  . Drug use: No  . Sexual activity: Not on file  Lifestyle  . Physical activity:    Days per week: Not on file    Minutes per session: Not on file  . Stress: Not on file  Relationships  . Social connections:    Talks on phone: Not on file    Gets together: Not on file    Attends religious service: Not on file    Active member of club or  organization: Not on file    Attends meetings of clubs or organizations: Not on file    Relationship status: Not on file  . Intimate partner violence:    Fear of current or ex partner: Not on file    Emotionally abused: Not on file    Physically abused: Not on file    Forced sexual activity: Not on file  Other Topics Concern  . Not on file  Social History Narrative  Lives in [place] alone.  Retired Quarry manager.   Caffeine use:    Right handed    Family History: Family History  Problem Relation Age of Onset  . Inflammatory bowel disease Maternal Grandmother      Review of Systems: All other systems reviewed and are otherwise negative except as noted above.   Physical Exam: VS:  BP 128/78   Pulse 62   Ht 5\' 4"  (1.626 m)   Wt 194 lb 3.2 oz (88.1 kg)   BMI 33.33 kg/m  , BMI Body mass index is 33.33 kg/m.  GEN- The patient is well appearing, alert and oriented x 3 today.   HEENT: normocephalic, atraumatic; sclera clear, conjunctiva pink; hearing intact; oropharynx clear; neck supple  Lungs- Clear to ausculation bilaterally, normal work of breathing.  No wheezes, rales, rhonchi Heart- Regular rate and rhythm  GI- soft, non-tender, non-distended, bowel sounds present  Extremities- no clubbing, cyanosis, or edema  MS- no significant deformity or atrophy Skin- warm and dry, no rash or lesion; PPM pocket well healed Psych- euthymic mood, full affect Neuro- strength and sensation are intact  PPM Interrogation- reviewed in detail today,  See PACEART report  EKG:  EKG is ordered today. The ekg ordered today shows atrial pacing with intrinsic ventricular conduction  Recent Labs: 06/25/2017: ALT 13 07/13/2017: BUN 21; Creatinine, Ser 0.88; Hemoglobin 12.6; Platelets 157; Potassium 4.3; Sodium 138   Wt Readings from Last 3 Encounters:  09/19/17 194 lb 3.2 oz (88.1 kg)  07/24/17 192 lb (87.1 kg)  07/19/17 191 lb 12.8 oz (87 kg)     Other studies Reviewed: Additional  studies/ records that were reviewed today include: hospital records   Assessment and Plan:  1.  Symptomatic bradycardia Normal PPM function See Pace Art report No changes today His bundle capture non selective down to loss of capture at 0.75V  2.  Paroxysmal atrial fibrillation Burden by device interrogation 0% Continue Eliquis for CHADS2VASC of 4  3.  HTN Stable No change required today  4.  Exercise intolerance/fatigue I am unsure of cause Normal device function - good heart rate distribution Will update echo and labs Follow up with Dr Lovena Le as scheduled    Current medicines are reviewed at length with the patient today.   The patient does not have concerns regarding her medicines.  The following changes were made today:  none  Labs/ tests ordered today include: none Orders Placed This Encounter  Procedures  . Basic metabolic panel  . CBC  . TSH  . Magnesium  . EKG 12-Lead  . ECHOCARDIOGRAM COMPLETE     Disposition:   Follow up with Dr Lovena Le as scheduled     Signed, Chanetta Marshall, NP 09/19/2017 3:17 PM  [address] [place] [address] [phone] (office) [phone] (fax)

## 2017-09-19 ENCOUNTER — Ambulatory Visit (HOSPITAL_COMMUNITY): Payer: Medicare Other | Attending: Internal Medicine

## 2017-09-19 ENCOUNTER — Other Ambulatory Visit: Payer: Self-pay

## 2017-09-19 ENCOUNTER — Ambulatory Visit (INDEPENDENT_AMBULATORY_CARE_PROVIDER_SITE_OTHER): Payer: Medicare Other | Admitting: Nurse Practitioner

## 2017-09-19 ENCOUNTER — Encounter: Payer: Self-pay | Admitting: Nurse Practitioner

## 2017-09-19 VITALS — BP 128/78 | HR 62 | Ht 64.0 in | Wt 194.2 lb

## 2017-09-19 DIAGNOSIS — I10 Essential (primary) hypertension: Secondary | ICD-10-CM | POA: Insufficient documentation

## 2017-09-19 DIAGNOSIS — R001 Bradycardia, unspecified: Secondary | ICD-10-CM | POA: Diagnosis not present

## 2017-09-19 DIAGNOSIS — Z950 Presence of cardiac pacemaker: Secondary | ICD-10-CM | POA: Insufficient documentation

## 2017-09-19 DIAGNOSIS — I480 Paroxysmal atrial fibrillation: Secondary | ICD-10-CM

## 2017-09-19 DIAGNOSIS — I4891 Unspecified atrial fibrillation: Secondary | ICD-10-CM | POA: Diagnosis not present

## 2017-09-19 DIAGNOSIS — G7000 Myasthenia gravis without (acute) exacerbation: Secondary | ICD-10-CM | POA: Insufficient documentation

## 2017-09-19 DIAGNOSIS — R0609 Other forms of dyspnea: Secondary | ICD-10-CM | POA: Diagnosis not present

## 2017-09-19 DIAGNOSIS — E785 Hyperlipidemia, unspecified: Secondary | ICD-10-CM | POA: Diagnosis not present

## 2017-09-19 LAB — ECHOCARDIOGRAM COMPLETE
HEIGHTINCHES: 64 in
Weight: 3107.2 oz

## 2017-09-19 NOTE — Patient Instructions (Addendum)
Medication Instructions:   Your physician recommends that you continue on your current medications as directed. Please refer to the Current Medication list given to you today.   If you need a refill on your cardiac medications before your next appointment, please call your pharmacy.  Labwork:  BMET TSH MAG  AND CBC TODAY    Testing/Procedures: Your physician has requested that you have an echocardiogram. Echocardiography is a painless test that uses sound waves to create images of your heart. It provides your doctor with information about the size and shape of your heart and how well your heart's chambers and valves are working. This procedure takes approximately one hour. There are no restrictions for this procedure.   Follow-Up:   AS SCHEDULED    Any Other Special Instructions Will Be Listed Below (If Applicable).

## 2017-09-20 LAB — CBC
HEMATOCRIT: 43.7 % (ref 34.0–46.6)
HEMOGLOBIN: 14.1 g/dL (ref 11.1–15.9)
MCH: 33.2 pg — ABNORMAL HIGH (ref 26.6–33.0)
MCHC: 32.3 g/dL (ref 31.5–35.7)
MCV: 103 fL — AB (ref 79–97)
Platelets: 170 10*3/uL (ref 150–450)
RBC: 4.25 x10E6/uL (ref 3.77–5.28)
RDW: 13.9 % (ref 12.3–15.4)
WBC: 8.6 10*3/uL (ref 3.4–10.8)

## 2017-09-20 LAB — BASIC METABOLIC PANEL
BUN / CREAT RATIO: 20 (ref 12–28)
BUN: 23 mg/dL (ref 8–27)
CO2: 22 mmol/L (ref 20–29)
CREATININE: 1.13 mg/dL — AB (ref 0.57–1.00)
Calcium: 9.9 mg/dL (ref 8.7–10.3)
Chloride: 102 mmol/L (ref 96–106)
GFR calc Af Amer: 53 mL/min/{1.73_m2} — ABNORMAL LOW (ref >59)
GFR calc non Af Amer: 46 mL/min/{1.73_m2} — ABNORMAL LOW (ref >59)
GLUCOSE: 94 mg/dL (ref 65–99)
Potassium: 4.6 mmol/L (ref 3.5–5.2)
Sodium: 140 mmol/L (ref 134–144)

## 2017-09-20 LAB — TSH: TSH: 1.76 u[IU]/mL (ref 0.450–4.500)

## 2017-09-20 LAB — MAGNESIUM: Magnesium: 2.3 mg/dL (ref 1.6–2.3)

## 2017-09-21 ENCOUNTER — Telehealth: Payer: Self-pay | Admitting: Internal Medicine

## 2017-09-21 NOTE — Telephone Encounter (Signed)
New Message: ° ° ° ° ° °Pt is returning a call for echo results °

## 2017-09-21 NOTE — Telephone Encounter (Signed)
Call placed to Pt with Echo results.

## 2017-09-25 ENCOUNTER — Other Ambulatory Visit: Payer: Self-pay | Admitting: Adult Health

## 2017-10-17 ENCOUNTER — Other Ambulatory Visit: Payer: Self-pay

## 2017-10-19 ENCOUNTER — Other Ambulatory Visit: Payer: Self-pay | Admitting: Adult Health

## 2017-10-23 DIAGNOSIS — Z85828 Personal history of other malignant neoplasm of skin: Secondary | ICD-10-CM | POA: Diagnosis not present

## 2017-10-23 DIAGNOSIS — D2372 Other benign neoplasm of skin of left lower limb, including hip: Secondary | ICD-10-CM | POA: Diagnosis not present

## 2017-10-23 DIAGNOSIS — Z08 Encounter for follow-up examination after completed treatment for malignant neoplasm: Secondary | ICD-10-CM | POA: Diagnosis not present

## 2017-10-31 ENCOUNTER — Ambulatory Visit: Payer: Medicare Other | Admitting: Internal Medicine

## 2017-11-05 ENCOUNTER — Other Ambulatory Visit (INDEPENDENT_AMBULATORY_CARE_PROVIDER_SITE_OTHER): Payer: Self-pay | Admitting: Internal Medicine

## 2017-11-05 ENCOUNTER — Encounter: Payer: Self-pay | Admitting: *Deleted

## 2017-11-05 ENCOUNTER — Encounter: Payer: Self-pay | Admitting: Internal Medicine

## 2017-11-05 ENCOUNTER — Ambulatory Visit (INDEPENDENT_AMBULATORY_CARE_PROVIDER_SITE_OTHER): Payer: Medicare Other | Admitting: Internal Medicine

## 2017-11-05 VITALS — BP 132/82 | HR 74 | Ht 64.0 in | Wt 200.2 lb

## 2017-11-05 DIAGNOSIS — I480 Paroxysmal atrial fibrillation: Secondary | ICD-10-CM

## 2017-11-05 DIAGNOSIS — K219 Gastro-esophageal reflux disease without esophagitis: Secondary | ICD-10-CM

## 2017-11-05 DIAGNOSIS — R001 Bradycardia, unspecified: Secondary | ICD-10-CM | POA: Diagnosis not present

## 2017-11-05 LAB — CUP PACEART INCLINIC DEVICE CHECK
Brady Statistic AP VP Percent: 0.63 %
Brady Statistic AP VS Percent: 98.1 %
Brady Statistic AS VP Percent: 0 %
Brady Statistic AS VS Percent: 1.26 %
Date Time Interrogation Session: 20190819124807
Implantable Lead Model: 5076
Lead Channel Impedance Value: 323 Ohm
Lead Channel Impedance Value: 418 Ohm
Lead Channel Pacing Threshold Amplitude: 0.75 V
Lead Channel Pacing Threshold Pulse Width: 0.4 ms
Lead Channel Sensing Intrinsic Amplitude: 1.25 mV
Lead Channel Sensing Intrinsic Amplitude: 8.375 mV
Lead Channel Sensing Intrinsic Amplitude: 9.875 mV
Lead Channel Setting Pacing Amplitude: 2.5 V
Lead Channel Setting Pacing Pulse Width: 0.4 ms
MDC IDC LEAD IMPLANT DT: 20190501
MDC IDC LEAD IMPLANT DT: 20190501
MDC IDC LEAD LOCATION: 753859
MDC IDC LEAD LOCATION: 753860
MDC IDC MSMT BATTERY REMAINING LONGEVITY: 153 mo
MDC IDC MSMT BATTERY VOLTAGE: 3.15 V
MDC IDC MSMT LEADCHNL RA PACING THRESHOLD AMPLITUDE: 0.75 V
MDC IDC MSMT LEADCHNL RA PACING THRESHOLD PULSEWIDTH: 0.4 ms
MDC IDC MSMT LEADCHNL RA SENSING INTR AMPL: 1.375 mV
MDC IDC MSMT LEADCHNL RV IMPEDANCE VALUE: 342 Ohm
MDC IDC MSMT LEADCHNL RV IMPEDANCE VALUE: 475 Ohm
MDC IDC PG IMPLANT DT: 20190501
MDC IDC SET LEADCHNL RA PACING AMPLITUDE: 2 V
MDC IDC SET LEADCHNL RV SENSING SENSITIVITY: 0.9 mV
MDC IDC STAT BRADY RA PERCENT PACED: 98.67 %
MDC IDC STAT BRADY RV PERCENT PACED: 0.66 %

## 2017-11-05 NOTE — Progress Notes (Signed)
HPI Mrs. Sockwell returns today for followup. She is s/p PPM insertion due to sinus node dysfunction. She has HTN. She has a h/o migraine headaches. Since her PPM insertion she has done well. She notes that her energy level has improved.  Allergies  Allergen Reactions  . Nitrofurantoin Macrocrystal Nausea And Vomiting  . Ampicillin Rash  . Avocado Diarrhea and Nausea And Vomiting  . Biaxin [Clarithromycin] Rash  . Doxycycline Monohydrate Nausea And Vomiting  . Moxifloxacin     Contraindication myasthenia gravis  . Oxycodone-Acetaminophen     Severe nausea Severe nausea  . Petrolatum-Zinc Oxide Rash  . Tetracyclines & Related Nausea And Vomiting  . Tylox [Oxycodone-Acetaminophen]     Severe nausea  . Codeine Itching    Can not take Codeine unless in cough syrup Can not take Codeine unless in cough syrup     Current Outpatient Medications  Medication Sig Dispense Refill  . ALPRAZolam (XANAX) 1 MG tablet Take 1 mg by mouth at bedtime.    . cholecalciferol (VITAMIN D) 1000 units tablet Take 1,000 Units by mouth daily.    . Coenzyme Q10 (CO Q 10) 10 MG CAPS Take 10 mg by mouth every evening.     . Cyanocobalamin (B-12) 3000 MCG SUBL Place 1 tablet under the tongue every evening. Time release    . diclofenac sodium (VOLTAREN) 1 % GEL Apply 4 g topically 4 (four) times daily as needed (for pain).     Marland Kitchen ELIQUIS 5 MG TABS tablet TAKE (1) TABLET BY MOUTH TWICE DAILY. 180 tablet 1  . HYDROcodone-acetaminophen (NORCO) 10-325 MG per tablet Take 1 tablet by mouth every 8 (eight) hours as needed (pain).     Marland Kitchen lansoprazole (PREVACID) 30 MG capsule TAKE (1) CAPSULE BY MOUTH TWICE DAILY. 180 capsule 3  . levothyroxine (SYNTHROID, LEVOTHROID) 100 MCG tablet Take 100 mcg by mouth daily before breakfast.     . lidocaine (LIDODERM) 5 % Place 1-3 patches onto the skin daily as needed (for pain). Remove & Discard patch within 12 hours or as directed by MD for pain    . losartan (COZAAR) 50 MG  tablet Take 50 mg by mouth daily.    . Magnesium 250 MG TABS Take 250 mg by mouth every evening.     . Omega-3 Fatty Acids (FISH OIL) 1200 MG CAPS Take 1,200 mg by mouth every evening.     Marland Kitchen OVER THE COUNTER MEDICATION Place 1 drop into both eyes 4 (four) times daily. Sooth XP Eye drops uses four times daily.     . pravastatin (PRAVACHOL) 80 MG tablet Take 80 mg by mouth every evening.     . propranolol (INDERAL) 20 MG tablet TAKE (1) TABLET BY MOUTH (3) TIMES DAILY. 270 tablet 0  . pyridostigmine (MESTINON) 60 MG tablet Take 60 mg by mouth 2 (two) times daily.    . SUMAtriptan (IMITREX) 50 MG tablet Take 50 mg by mouth every 2 (two) hours as needed. For migraines     No current facility-administered medications for this visit.      Past Medical History:  Diagnosis Date  . Anemia   . Chronic back pain   . Chronic nausea   . Complication of anesthesia    Low blood pressure, heart rate, O2 sat  . Depression with anxiety   . Depression with anxiety   . Diverticulosis   . Dyspnea   . Essential hypertension   . GERD (gastroesophageal reflux disease)   .  History of pneumonia   . Hypothyroidism   . IBS (irritable colon syndrome)   . LVH (left ventricular hypertrophy)   . Migraines   . Mixed hyperlipidemia   . Obesity   . Ocular myasthenia gravis ([place])   . Osteoarthritis of knee   . PAF (paroxysmal atrial fibrillation) (HCC)    chads2vasc score is at least 4  . Paraganglioma ([place])    Resection 02/2012 (retroperitoneal)  . Paraganglioma, malignant ([place]) 11/06/2015  . Presence of permanent cardiac pacemaker 07/18/2017  . Renal insufficiency   . Skin cancer     ROS:   All systems reviewed and negative except as noted in the HPI.   Past Surgical History:  Procedure Laterality Date  . ABDOMINAL HYSTERECTOMY    . ABDOMINAL HYSTERECTOMY    . CARDIAC CATHETERIZATION    . CHOLECYSTECTOMY    . COLONOSCOPY  08/24/2011   Procedure: COLONOSCOPY;  Surgeon: Rogene Houston, MD;   [facility];  Service: Endoscopy;  Laterality: N/A;  1200  . CT guided biopsy  01/16/12  . GIVENS CAPSULE STUDY  01/29/2012   Procedure: GIVENS CAPSULE STUDY;  Surgeon: Rogene Houston, MD;  [facility];  Service: Endoscopy;  Laterality: N/A;  730  . KNEE SURGERY    . LAPAROTOMY  03/01/2012   Procedure: EXPLORATORY LAPAROTOMY;  Surgeon: Earnstine Regal, MD;  [facility];  Service: General;  Laterality: N/A;  Exploratory Laparotomy ,Resection Retroperitoneal Mass  . NASAL SINUS SURGERY     30 yrs ago  . PACEMAKER IMPLANT N/A 07/18/2017   Procedure: PACEMAKER IMPLANT;  Surgeon: Evans Lance, MD;  Location: [facility];  Service: Cardiovascular;  Laterality: N/A;  . TOTAL KNEE ARTHROPLASTY Bilateral    2005 and 2011     Family History  Problem Relation Age of Onset  . Inflammatory bowel disease Maternal Grandmother      Social History   Socioeconomic History  . Marital status: Widowed    Spouse name: Not on file  . Number of children: Not on file  . Years of education: Not on file  . Highest education level: Not on file  Occupational History  . Occupation: Retired Quarry manager  Social Needs  . Financial resource strain: Not on file  . Food insecurity:    Worry: Not on file    Inability: Not on file  . Transportation needs:    Medical: Not on file    Non-medical: Not on file  Tobacco Use  . Smoking status: Never Smoker  . Smokeless tobacco: Never Used  Substance and Sexual Activity  . Alcohol use: No    Alcohol/week: 0.0 standard drinks  . Drug use: No  . Sexual activity: Not on file  Lifestyle  . Physical activity:    Days per week: Not on file    Minutes per session: Not on file  . Stress: Not on file  Relationships  . Social connections:    Talks on phone: Not on file    Gets together: Not on file    Attends religious service: Not on file    Active member of club or organization: Not on file    Attends meetings of clubs or  organizations: Not on file    Relationship status: Not on file  . Intimate partner violence:    Fear of current or ex partner: Not on file    Emotionally abused: Not on file    Physically abused: Not on  file    Forced sexual activity: Not on file  Other Topics Concern  . Not on file  Social History Narrative   Lives in [place] alone.  Retired Quarry manager.   Caffeine use:    Right handed     BP 132/82   Pulse 74   Ht 5\' 4"  (1.626 m)   Wt 200 lb 3.2 oz (90.8 kg)   SpO2 94%   BMI 34.36 kg/m   Physical Exam:  Well appearing 77 yo woman, NAD HEENT: Unremarkable Neck:  No JVD, no thyromegally Lymphatics:  No adenopathy Back:  No CVA tenderness Lungs:  Clear with no wheezes HEART:  Regular rate rhythm, no murmurs, no rubs, no clicks Abd:  soft, positive bowel sounds, no organomegally, no rebound, no guarding Ext:  2 plus pulses, no edema, no cyanosis, no clubbing Skin:  No rashes no nodules Neuro:  CN II through XII intact, motor grossly intact  EKG - NSR with atrial pacing  DEVICE  Normal device function.  See PaceArt for details.   Assess/Plan: 1. Sinus node dysfunction - she is doing well after undergoing PPM insertion. 2. HTN - her blood pressure is very well controlled.  3. PAF - she is maintaining NSR and has not had any trouble with her blood thinners.   Mikle Bosworth.D.

## 2017-11-05 NOTE — Patient Instructions (Signed)
Medication Instructions:  Your physician recommends that you continue on your current medications as directed. Please refer to the Current Medication list given to you today.   Labwork: NONE   Testing/Procedures: NONE   Follow-Up: Your physician wants you to follow-up in: 9 Months with Dr. Taylor. You will receive a reminder letter in the mail two months in advance. If you don't receive a letter, please call our office to schedule the follow-up appointment.   Any Other Special Instructions Will Be Listed Below (If Applicable).     If you need a refill on your cardiac medications before your next appointment, please call your pharmacy. Thank you for choosing [facility]!    

## 2017-11-13 ENCOUNTER — Encounter: Payer: Self-pay | Admitting: Neurology

## 2017-11-13 ENCOUNTER — Ambulatory Visit (INDEPENDENT_AMBULATORY_CARE_PROVIDER_SITE_OTHER): Payer: Medicare Other | Admitting: Neurology

## 2017-11-13 VITALS — BP 130/81 | HR 70 | Ht 64.0 in | Wt 203.0 lb

## 2017-11-13 DIAGNOSIS — M25562 Pain in left knee: Secondary | ICD-10-CM | POA: Diagnosis not present

## 2017-11-13 DIAGNOSIS — M25552 Pain in left hip: Secondary | ICD-10-CM | POA: Diagnosis not present

## 2017-11-13 DIAGNOSIS — M79672 Pain in left foot: Secondary | ICD-10-CM | POA: Diagnosis not present

## 2017-11-13 DIAGNOSIS — G7000 Myasthenia gravis without (acute) exacerbation: Secondary | ICD-10-CM | POA: Diagnosis not present

## 2017-11-13 DIAGNOSIS — M170 Bilateral primary osteoarthritis of knee: Secondary | ICD-10-CM | POA: Diagnosis not present

## 2017-11-13 MED ORDER — PYRIDOSTIGMINE BROMIDE 60 MG PO TABS: 30.0000 mg | ORAL_TABLET | Freq: Four times a day (QID) | ORAL | 3 refills | Status: DC

## 2017-11-13 NOTE — Patient Instructions (Signed)
Myasthenia Gravis  Myasthenia gravis (MG) means severe weakness. It is a long-term (chronic) condition that causes weakness in the muscles you can control (voluntary muscles). MG can affect any voluntary muscle. The muscles most often affected are the ones that control:   Eye movement.   Facial movements.   Swallowing.    MG is an autoimmune disease, which means that your body's defense system (immune system) attacks healthy parts of your body instead of germs and other things that make you sick. When you have MG, your immune system makes proteins (antibodies) that block the chemical (acetylcholine) your body needs to send nerve signals to your muscles. This causes muscle weakness.  What are the causes?  The exact cause of MG is unknown. One possible cause is an enlarged thymus gland, which is located under your breastbone.  What are the signs or symptoms?  The earliest symptom of MG is muscle weakness that gets worse with activity and gets better after rest. Other symptoms of MG may include:   Drooping eyelids.   Double vision.   Loss of facial expression.   Trouble chewing and swallowing.   Slurred speech.   A waddling walk.   Weakness of the arms, hands, and legs.    Trouble breathing is the most dangerous symptom of MG. Sudden and severe difficulty breathing (myasthenic crisis) may require emergency breathing support. This symptom sometimes happens after:   Infection.   Fever.   Drug reaction.    How is this diagnosed?  It can be hard to diagnose MG because muscle weakness is a common symptom in many conditions. Your health care provider will do a physical exam. You may also have tests that will help make a diagnosis. These may include:   A blood test.   A test using the medicine edrophonium. This medicine increases muscle strength by slowing the breakdown of acetylcholine.   Tests to measure nerve conduction to muscle (electromyography).   An imaging study of the chest (CT or MRI).    How is  this treated?  Treatment can improve muscle strength. Sometimes symptoms of MG go away for a while (remission) and you can stop treatment. Possible treatments include:   Medicine.   Removal of the thymus gland (thymectomy). This may result in a long remission for some people.    Follow these instructions at home:   Take medicines only as directed by your health care provider.   Get plenty of rest to conserve your energy.   Take frequent breaks to rest your eyes.   Maintain a healthy diet and a healthy weight.   Do not use any tobacco products including cigarettes, chewing tobacco, or electronic cigarettes. If you need help quitting, ask your health care provider.   Keep all follow-up visits as directed by your health care provider. This is important.  Contact a health care provider if:   Your symptoms get worse after a fever or infection.   You have a reaction to a medicine you are taking.   Your symptoms change or get worse.  Get help right away if:  You have trouble breathing.  This information is not intended to replace advice given to you by your health care provider. Make sure you discuss any questions you have with your health care provider.  Document Released: 06/12/2000 Document Revised: 08/12/2015 Document Reviewed: 05/07/2013  Elsevier Interactive Patient Education  2018 [employer] Inc[employer]

## 2017-11-13 NOTE — Progress Notes (Signed)
Reason for visit: Ocular myasthenia gravis  [name] is an 77 y.o. female  History of present illness:  [name] is a 77 year old right-handed white female with a history of ocular myasthenia gravis.  The patient has chronic double vision, she has had cataract surgery on the left with a lens implant and the visual acuity there is normal.  The right eye has significant problems with blurred vision.  With the dichotomy in her visual acuity, she sees less double vision, she does not drive a car.  The patient recently had a pacemaker placed on 18 Jul 2017.  This has helped her overall energy level.  The patient is on Mestinon taking 60 mg twice daily.  She has noted over the last 2 years that occasionally she will note that her left foot feels like it is not there when she tries to take a step, this last only a second or 2 and then clears.  These episodes have become more frequent, they have not resulted in any falls.  The patient has migraine headaches, she is on propranolol 20 mg 3 times daily, she will take Imitrex if needed for the headache.  The patient may have on average of one headache a month.  The patient has gastroesophageal reflux disease, she has a history of a paraganglioma.  The patient has had bilateral total knee replacements.  The patient returns for an evaluation.  Past Medical History:  Diagnosis Date  . Anemia   . Chronic back pain   . Chronic nausea   . Complication of anesthesia    Low blood pressure, heart rate, O2 sat  . Depression with anxiety   . Depression with anxiety   . Diverticulosis   . Dyspnea   . Essential hypertension   . GERD (gastroesophageal reflux disease)   . History of pneumonia   . Hypothyroidism   . IBS (irritable colon syndrome)   . LVH (left ventricular hypertrophy)   . Migraines   . Mixed hyperlipidemia   . Obesity   . Ocular myasthenia gravis ([place])   . Osteoarthritis of knee   . PAF (paroxysmal atrial fibrillation) (HCC)    chads2vasc score is at least 4  . Paraganglioma ([place])    Resection 02/2012 (retroperitoneal)  . Paraganglioma, malignant ([place]) 11/06/2015  . Presence of permanent cardiac pacemaker 07/18/2017  . Renal insufficiency   . Skin cancer     Past Surgical History:  Procedure Laterality Date  . ABDOMINAL HYSTERECTOMY    . ABDOMINAL HYSTERECTOMY    . CARDIAC CATHETERIZATION    . CHOLECYSTECTOMY    . COLONOSCOPY  08/24/2011   Procedure: COLONOSCOPY;  Surgeon: Rogene Houston, MD;  [facility];  Service: Endoscopy;  Laterality: N/A;  1200  . CT guided biopsy  01/16/12  . GIVENS CAPSULE STUDY  01/29/2012   Procedure: GIVENS CAPSULE STUDY;  Surgeon: Rogene Houston, MD;  [facility];  Service: Endoscopy;  Laterality: N/A;  730  . KNEE SURGERY    . LAPAROTOMY  03/01/2012   Procedure: EXPLORATORY LAPAROTOMY;  Surgeon: Earnstine Regal, MD;  [facility];  Service: General;  Laterality: N/A;  Exploratory Laparotomy ,Resection Retroperitoneal Mass  . NASAL SINUS SURGERY     30 yrs ago  . PACEMAKER IMPLANT N/A 07/18/2017   Procedure: PACEMAKER IMPLANT;  Surgeon: Evans Lance, MD;  Location: [facility];  Service: Cardiovascular;  Laterality: N/A;  . TOTAL KNEE ARTHROPLASTY Bilateral  2005 and 2011    Family History  Problem Relation Age of Onset  . Inflammatory bowel disease Maternal Grandmother     Social history:  reports that she has never smoked. She has never used smokeless tobacco. She reports that she does not drink alcohol or use drugs.    Allergies  Allergen Reactions  . Nitrofurantoin Macrocrystal Nausea And Vomiting  . Ampicillin Rash  . Avocado Diarrhea and Nausea And Vomiting  . Biaxin [Clarithromycin] Rash  . Doxycycline Monohydrate Nausea And Vomiting  . Moxifloxacin     Contraindication myasthenia gravis  . Oxycodone-Acetaminophen     Severe nausea Severe nausea  . Petrolatum-Zinc Oxide Rash  . Tetracyclines & Related Nausea And  Vomiting  . Tylox [Oxycodone-Acetaminophen]     Severe nausea  . Codeine Itching    Can not take Codeine unless in cough syrup Can not take Codeine unless in cough syrup    Medications:  Prior to Admission medications   Medication Sig Start Date End Date Taking? Authorizing Provider  ALPRAZolam Duanne Moron) 1 MG tablet Take 1 mg by mouth at bedtime.   Yes [provider]  cholecalciferol (VITAMIN D) 1000 units tablet Take 1,000 Units by mouth daily.   Yes [provider]  Coenzyme Q10 (CO Q 10) 10 MG CAPS Take 10 mg by mouth every evening.    Yes [provider]  Cyanocobalamin (B-12) 3000 MCG SUBL Place 1 tablet under the tongue every evening. Time release   Yes [provider]  diclofenac sodium (VOLTAREN) 1 % GEL Apply 4 g topically 4 (four) times daily as needed (for pain).  01/13/15  Yes [provider]  ELIQUIS 5 MG TABS tablet TAKE (1) TABLET BY MOUTH TWICE DAILY. 09/25/17  Yes Herminio Commons, MD  HYDROcodone-acetaminophen (NORCO) 10-325 MG per tablet Take 1 tablet by mouth every 8 (eight) hours as needed (pain).    Yes [provider]  lansoprazole (PREVACID) 30 MG capsule TAKE (1) CAPSULE BY MOUTH TWICE DAILY. 11/05/17  Yes Setzer, Rona Ravens, NP  levothyroxine (SYNTHROID, LEVOTHROID) 100 MCG tablet Take 100 mcg by mouth daily before breakfast.  02/05/15  Yes [provider]  lidocaine (LIDODERM) 5 % Place 1-3 patches onto the skin daily as needed (for pain). Remove & Discard patch within 12 hours or as directed by MD for pain   Yes [provider]  Magnesium 250 MG TABS Take 250 mg by mouth every evening.    Yes [provider]  Omega-3 Fatty Acids (FISH OIL) 1200 MG CAPS Take 1,200 mg by mouth every evening.    Yes [provider]  OVER THE COUNTER MEDICATION Place 1 drop into both eyes 4 (four) times daily. Sooth XP Eye drops uses four times daily.    Yes [provider]  pravastatin  (PRAVACHOL) 80 MG tablet Take 80 mg by mouth every evening.    Yes [provider]  propranolol (INDERAL) 20 MG tablet TAKE (1) TABLET BY MOUTH (3) TIMES DAILY. Patient taking differently: Take 20 mg by mouth every 8 (eight) hours.  10/19/17  Yes Lendon Colonel, NP  pyridostigmine (MESTINON) 60 MG tablet Take 0.5 tablets (30 mg total) by mouth 4 (four) times daily. 11/13/17  Yes Kathrynn Ducking, MD  SUMAtriptan (IMITREX) 50 MG tablet Take 50 mg by mouth every 2 (two) hours as needed. For migraines   Yes [provider]    ROS:  Out of a complete 14 system review of  symptoms, the patient complains only of the following symptoms, and all other reviewed systems are negative.  Increased activity, decreased fatigue Runny nose Double vision, blurred vision Cold intolerance Constipation Insomnia Food allergies Joint pain, joint swelling, back pain, aching muscles, muscle cramps, walking difficulty, neck pain, neck stiffness Moles Bruising easily Weakness  Blood pressure 130/81, pulse 70, height 5\' 4"  (1.626 m), weight 203 lb (92.1 kg), SpO2 98 %.  Physical Exam  General: The patient is alert and cooperative at the time of the examination.  The patient is moderately obese.  Skin: No significant peripheral edema is noted.   Neurologic Exam  Mental status: The patient is alert and oriented x 3 at the time of the examination. The patient has apparent normal recent and remote memory, with an apparently normal attention span and concentration ability.   Cranial nerves: Facial symmetry is present. Speech is normal, no aphasia or dysarthria is noted. Extraocular movements are full.  On primary gaze, there is slight adduction of the left eye.  With superior gaze for 1 minute, no increase in ptosis is seen.  Visual fields are full.  Motor: The patient has good strength in all 4 extremities.  With arms outstretched for 1 minute, there appears to be some fatigable weakness  of the right greater than left deltoid.  Sensory examination: Soft touch sensation is symmetric on the face, arms, and legs.  Coordination: The patient has good finger-nose-finger and heel-to-shin bilaterally.  Gait and station: The patient has a slightly wide-based gait, the patient normally uses a cane for ambulation.  Tandem gait was not attempted.  Romberg is negative. No drift is seen.  The patient is able to walk on heels and the toes bilaterally.  Reflexes: Deep tendon reflexes are symmetric.   Assessment/Plan:  1.  Ocular myasthenia gravis  2.  Migraine headache  The patient is on propranolol which is relatively contraindicated with myasthenia gravis.  The patient appears to have some fatigable weakness of the deltoid muscles which was not present on her prior evaluation, this will need to be followed, if this worsens the patient may need to come off of propranolol and go on immunosuppressant agents.  I have recommended that she does her Mestinon 30 mg every 4 hours while awake.  The patient will follow-up in 6 months, sooner if needed.  A prescription for the Mestinon was sent in.  Jill Alexanders MD 11/13/2017 10:26 AM  [facility] [address] [place], [place] [address]  Phone [phone] Fax [phone]

## 2017-11-28 DIAGNOSIS — C755 Malignant neoplasm of aortic body and other paraganglia: Secondary | ICD-10-CM | POA: Diagnosis not present

## 2017-11-28 DIAGNOSIS — N183 Chronic kidney disease, stage 3 (moderate): Secondary | ICD-10-CM | POA: Diagnosis not present

## 2017-11-29 DIAGNOSIS — I10 Essential (primary) hypertension: Secondary | ICD-10-CM | POA: Diagnosis not present

## 2017-11-29 DIAGNOSIS — Z6830 Body mass index (BMI) 30.0-30.9, adult: Secondary | ICD-10-CM | POA: Diagnosis not present

## 2017-11-29 DIAGNOSIS — Z6829 Body mass index (BMI) 29.0-29.9, adult: Secondary | ICD-10-CM | POA: Diagnosis not present

## 2017-11-29 DIAGNOSIS — I4891 Unspecified atrial fibrillation: Secondary | ICD-10-CM | POA: Diagnosis not present

## 2017-11-29 DIAGNOSIS — Z23 Encounter for immunization: Secondary | ICD-10-CM | POA: Diagnosis not present

## 2017-11-29 DIAGNOSIS — R0602 Shortness of breath: Secondary | ICD-10-CM | POA: Diagnosis not present

## 2017-11-29 DIAGNOSIS — R05 Cough: Secondary | ICD-10-CM | POA: Diagnosis not present

## 2017-11-29 DIAGNOSIS — G4700 Insomnia, unspecified: Secondary | ICD-10-CM | POA: Diagnosis not present

## 2017-11-29 DIAGNOSIS — N183 Chronic kidney disease, stage 3 (moderate): Secondary | ICD-10-CM | POA: Diagnosis not present

## 2017-11-29 DIAGNOSIS — E782 Mixed hyperlipidemia: Secondary | ICD-10-CM | POA: Diagnosis not present

## 2017-11-29 DIAGNOSIS — G894 Chronic pain syndrome: Secondary | ICD-10-CM | POA: Diagnosis not present

## 2017-11-29 DIAGNOSIS — N644 Mastodynia: Secondary | ICD-10-CM | POA: Diagnosis not present

## 2017-11-29 DIAGNOSIS — G7000 Myasthenia gravis without (acute) exacerbation: Secondary | ICD-10-CM | POA: Diagnosis not present

## 2017-12-04 DIAGNOSIS — G7000 Myasthenia gravis without (acute) exacerbation: Secondary | ICD-10-CM | POA: Diagnosis not present

## 2017-12-04 DIAGNOSIS — Z0001 Encounter for general adult medical examination with abnormal findings: Secondary | ICD-10-CM | POA: Diagnosis not present

## 2017-12-04 DIAGNOSIS — E782 Mixed hyperlipidemia: Secondary | ICD-10-CM | POA: Diagnosis not present

## 2017-12-04 DIAGNOSIS — N183 Chronic kidney disease, stage 3 (moderate): Secondary | ICD-10-CM | POA: Diagnosis not present

## 2017-12-04 DIAGNOSIS — I4891 Unspecified atrial fibrillation: Secondary | ICD-10-CM | POA: Diagnosis not present

## 2017-12-04 DIAGNOSIS — I10 Essential (primary) hypertension: Secondary | ICD-10-CM | POA: Diagnosis not present

## 2017-12-04 DIAGNOSIS — E039 Hypothyroidism, unspecified: Secondary | ICD-10-CM | POA: Diagnosis not present

## 2017-12-04 DIAGNOSIS — Z23 Encounter for immunization: Secondary | ICD-10-CM | POA: Diagnosis not present

## 2017-12-04 DIAGNOSIS — K219 Gastro-esophageal reflux disease without esophagitis: Secondary | ICD-10-CM | POA: Diagnosis not present

## 2017-12-04 DIAGNOSIS — Z6831 Body mass index (BMI) 31.0-31.9, adult: Secondary | ICD-10-CM | POA: Diagnosis not present

## 2017-12-12 DIAGNOSIS — M19071 Primary osteoarthritis, right ankle and foot: Secondary | ICD-10-CM | POA: Diagnosis not present

## 2017-12-12 DIAGNOSIS — M79671 Pain in right foot: Secondary | ICD-10-CM | POA: Diagnosis not present

## 2017-12-12 DIAGNOSIS — M19072 Primary osteoarthritis, left ankle and foot: Secondary | ICD-10-CM | POA: Diagnosis not present

## 2017-12-12 DIAGNOSIS — M79672 Pain in left foot: Secondary | ICD-10-CM | POA: Diagnosis not present

## 2017-12-14 DIAGNOSIS — M25552 Pain in left hip: Secondary | ICD-10-CM | POA: Diagnosis not present

## 2017-12-14 DIAGNOSIS — C755 Malignant neoplasm of aortic body and other paraganglia: Secondary | ICD-10-CM | POA: Diagnosis not present

## 2017-12-14 DIAGNOSIS — N183 Chronic kidney disease, stage 3 (moderate): Secondary | ICD-10-CM | POA: Diagnosis not present

## 2017-12-14 DIAGNOSIS — M48061 Spinal stenosis, lumbar region without neurogenic claudication: Secondary | ICD-10-CM | POA: Diagnosis not present

## 2017-12-14 DIAGNOSIS — M545 Low back pain: Secondary | ICD-10-CM | POA: Diagnosis not present

## 2017-12-20 DIAGNOSIS — M6281 Muscle weakness (generalized): Secondary | ICD-10-CM | POA: Diagnosis not present

## 2017-12-20 DIAGNOSIS — M79672 Pain in left foot: Secondary | ICD-10-CM | POA: Diagnosis not present

## 2017-12-20 DIAGNOSIS — R262 Difficulty in walking, not elsewhere classified: Secondary | ICD-10-CM | POA: Diagnosis not present

## 2017-12-20 DIAGNOSIS — M79671 Pain in right foot: Secondary | ICD-10-CM | POA: Diagnosis not present

## 2017-12-24 DIAGNOSIS — M6281 Muscle weakness (generalized): Secondary | ICD-10-CM | POA: Diagnosis not present

## 2017-12-24 DIAGNOSIS — R262 Difficulty in walking, not elsewhere classified: Secondary | ICD-10-CM | POA: Diagnosis not present

## 2017-12-24 DIAGNOSIS — M79672 Pain in left foot: Secondary | ICD-10-CM | POA: Diagnosis not present

## 2017-12-24 DIAGNOSIS — M79671 Pain in right foot: Secondary | ICD-10-CM | POA: Diagnosis not present

## 2017-12-26 ENCOUNTER — Other Ambulatory Visit (HOSPITAL_COMMUNITY)
Admission: RE | Admit: 2017-12-26 | Discharge: 2017-12-26 | Disposition: A | Discharge status: Home / Self Care | Payer: Medicare Other | Source: Ambulatory Visit | Attending: Oncology | Admitting: Oncology

## 2017-12-26 ENCOUNTER — Other Ambulatory Visit: Payer: Self-pay | Admitting: Cardiovascular Disease

## 2017-12-26 DIAGNOSIS — C755 Malignant neoplasm of aortic body and other paraganglia: Secondary | ICD-10-CM | POA: Diagnosis not present

## 2017-12-31 DIAGNOSIS — M4186 Other forms of scoliosis, lumbar region: Secondary | ICD-10-CM | POA: Diagnosis not present

## 2017-12-31 DIAGNOSIS — M5136 Other intervertebral disc degeneration, lumbar region: Secondary | ICD-10-CM | POA: Diagnosis not present

## 2017-12-31 DIAGNOSIS — M545 Low back pain: Secondary | ICD-10-CM | POA: Diagnosis not present

## 2017-12-31 LAB — CATECHOLAMINES,UR.,FREE,24 HR
Dopamine, Rand Ur: 72 ug/L
Dopamine, Ur, 24Hr: 216 ug/24 hr (ref 0–510)
EPINEPHRINE RAND UR: 3 ug/L
Epinephrine, U, 24Hr: 9 ug/24 hr (ref 0–20)
NOREPINEPH RAND UR: 20 ug/L
NOREPINEPHRINE,U,24H: 60 ug/(24.h) (ref 0–135)
Total Volume: 3000

## 2018-01-07 ENCOUNTER — Other Ambulatory Visit: Payer: Self-pay | Admitting: Adult Health

## 2018-01-16 ENCOUNTER — Other Ambulatory Visit: Payer: Self-pay | Admitting: Adult Health

## 2018-01-28 ENCOUNTER — Encounter: Payer: Self-pay | Admitting: Internal Medicine

## 2018-02-04 ENCOUNTER — Ambulatory Visit (INDEPENDENT_AMBULATORY_CARE_PROVIDER_SITE_OTHER): Payer: Medicare Other

## 2018-02-04 DIAGNOSIS — R001 Bradycardia, unspecified: Secondary | ICD-10-CM

## 2018-02-04 NOTE — Progress Notes (Signed)
Remote pacemaker transmission.   

## 2018-02-07 ENCOUNTER — Encounter: Payer: Self-pay | Admitting: Cardiology

## 2018-02-07 ENCOUNTER — Other Ambulatory Visit (HOSPITAL_COMMUNITY): Payer: Self-pay | Admitting: Adult Health Nurse Practitioner

## 2018-02-07 DIAGNOSIS — Z1231 Encounter for screening mammogram for malignant neoplasm of breast: Secondary | ICD-10-CM

## 2018-02-11 ENCOUNTER — Ambulatory Visit (HOSPITAL_COMMUNITY): Payer: Medicare Other

## 2018-02-18 ENCOUNTER — Ambulatory Visit (INDEPENDENT_AMBULATORY_CARE_PROVIDER_SITE_OTHER): Payer: Medicare Other | Admitting: Internal Medicine

## 2018-02-18 ENCOUNTER — Encounter: Payer: Self-pay | Admitting: Internal Medicine

## 2018-02-18 VITALS — BP 160/80 | HR 80 | Ht 64.0 in | Wt 200.6 lb

## 2018-02-18 DIAGNOSIS — I10 Essential (primary) hypertension: Secondary | ICD-10-CM | POA: Diagnosis not present

## 2018-02-18 DIAGNOSIS — F39 Unspecified mood [affective] disorder: Secondary | ICD-10-CM | POA: Diagnosis not present

## 2018-02-18 DIAGNOSIS — I480 Paroxysmal atrial fibrillation: Secondary | ICD-10-CM

## 2018-02-18 DIAGNOSIS — R001 Bradycardia, unspecified: Secondary | ICD-10-CM | POA: Diagnosis not present

## 2018-02-18 DIAGNOSIS — Z950 Presence of cardiac pacemaker: Secondary | ICD-10-CM

## 2018-02-18 LAB — CUP PACEART INCLINIC DEVICE CHECK
Date Time Interrogation Session: 20191202115745
Implantable Lead Implant Date: 20190501
Implantable Lead Location: 753860
Implantable Pulse Generator Implant Date: 20190501
MDC IDC LEAD IMPLANT DT: 20190501
MDC IDC LEAD LOCATION: 753859

## 2018-02-18 MED ORDER — ATENOLOL 25 MG PO TABS: ORAL_TABLET | ORAL | 3 refills | Status: DC

## 2018-02-18 NOTE — Patient Instructions (Addendum)
Medication Instructions:  Your physician has recommended you make the following change in your medication:   1.  Stop taking INDERAL  2.  Start taking ATENOLOL 25 mg---Take one tablet by mouth daily.  You may take an additional one tablet for systolic blood pressure (top number) greater than 150 or severe palpitations.  Labwork: None ordered.  Testing/Procedures:  You will have a nurse visit in one month for a BP check and a 12 lead EKG in [place].  March 26, 2018 at 4:00 PM   Follow-Up:  Your physician wants you to follow-up in: one year with Dr. Lovena Le.   You will receive a reminder letter in the mail two months in advance. If you don't receive a letter, please call our office to schedule the follow-up appointment.  May 2020 at the [facility] office  Remote monitoring is used to monitor your Pacemaker from home. This monitoring reduces the number of office visits required to check your device to one time per year. It allows [place] to keep an eye on the functioning of your device to ensure it is working properly. You are scheduled for a device check from home on 05/06/2018. You may send your transmission at any time that day. If you have a wireless device, the transmission will be sent automatically. After your physician reviews your transmission, you will receive a postcard with your next transmission date.  Any Other Special Instructions Will Be Listed Below (If Applicable).  If you need a refill on your cardiac medications before your next appointment, please call your pharmacy.

## 2018-02-18 NOTE — Progress Notes (Signed)
HPI [name] returns today for followup. She is s/p PPM insertion due to sinus node dysfunction. She has HTN. She has a h/o migraine headaches. Since her PPM insertion she has done well in that she does not feel her rapid atrial fib. She notes that her energy level with the inderal and myasthenia has been down. She wants to know about additional options. She does have a h/o migraines for which inderal has also helped with.  Allergies  Allergen Reactions  . Nitrofurantoin Macrocrystal Nausea And Vomiting  . Ampicillin Rash  . Avocado Diarrhea and Nausea And Vomiting  . Biaxin [Clarithromycin] Rash  . Doxycycline Monohydrate Nausea And Vomiting  . Moxifloxacin     Contraindication myasthenia gravis  . Oxycodone-Acetaminophen     Severe nausea Severe nausea  . Petrolatum-Zinc Oxide Rash  . Tetracyclines & Related Nausea And Vomiting  . Tylox [Oxycodone-Acetaminophen]     Severe nausea  . Codeine Itching    Can not take Codeine unless in cough syrup Can not take Codeine unless in cough syrup     Current Outpatient Medications  Medication Sig Dispense Refill  . ALPRAZolam (XANAX) 1 MG tablet Take 1 mg by mouth at bedtime.    . cholecalciferol (VITAMIN D) 1000 units tablet Take 1,000 Units by mouth daily.    . Coenzyme Q10 (CO Q 10) 10 MG CAPS Take 10 mg by mouth every evening.     . Cyanocobalamin (B-12) 3000 MCG SUBL Place 1 tablet under the tongue every evening. Time release    . diclofenac sodium (VOLTAREN) 1 % GEL Apply 4 g topically 4 (four) times daily as needed (for pain).     Marland Kitchen ELIQUIS 5 MG TABS tablet TAKE (1) TABLET BY MOUTH TWICE DAILY. 180 tablet 0  . HYDROcodone-acetaminophen (NORCO) 10-325 MG per tablet Take 1 tablet by mouth every 8 (eight) hours as needed (pain).     Marland Kitchen lansoprazole (PREVACID) 30 MG capsule TAKE (1) CAPSULE BY MOUTH TWICE DAILY. 180 capsule 3  . levothyroxine (SYNTHROID, LEVOTHROID) 100 MCG tablet Take 100 mcg by mouth daily before breakfast.      . lidocaine (LIDODERM) 5 % Place 1-3 patches onto the skin daily as needed (for pain). Remove & Discard patch within 12 hours or as directed by MD for pain    . Magnesium 250 MG TABS Take 250 mg by mouth every evening.     Marland Kitchen OVER THE COUNTER MEDICATION Place 1 drop into both eyes 4 (four) times daily. Sooth XP Eye drops uses four times daily.     . pravastatin (PRAVACHOL) 80 MG tablet Take 80 mg by mouth every evening.     . propranolol (INDERAL) 20 MG tablet TAKE (1) TABLET BY MOUTH (3) TIMES DAILY. 270 tablet 2  . pyridostigmine (MESTINON) 60 MG tablet Take 0.5 tablets (30 mg total) by mouth 4 (four) times daily. 180 tablet 3  . SUMAtriptan (IMITREX) 50 MG tablet Take 50 mg by mouth every 2 (two) hours as needed. For migraines     No current facility-administered medications for this visit.      Past Medical History:  Diagnosis Date  . Anemia   . Chronic back pain   . Chronic nausea   . Complication of anesthesia    Low blood pressure, heart rate, O2 sat  . Depression with anxiety   . Depression with anxiety   . Diverticulosis   . Dyspnea   . Essential hypertension   .  GERD (gastroesophageal reflux disease)   . History of pneumonia   . Hypothyroidism   . IBS (irritable colon syndrome)   . LVH (left ventricular hypertrophy)   . Migraines   . Mixed hyperlipidemia   . Obesity   . Ocular myasthenia gravis ([place])   . Osteoarthritis of knee   . PAF (paroxysmal atrial fibrillation) (HCC)    chads2vasc score is at least 4  . Paraganglioma ([place])    Resection 02/2012 (retroperitoneal)  . Paraganglioma, malignant ([place]) 11/06/2015  . Presence of permanent cardiac pacemaker 07/18/2017  . Renal insufficiency   . Skin cancer     ROS:   All systems reviewed and negative except as noted in the HPI.   Past Surgical History:  Procedure Laterality Date  . ABDOMINAL HYSTERECTOMY    . ABDOMINAL HYSTERECTOMY    . CARDIAC CATHETERIZATION    . CHOLECYSTECTOMY    . COLONOSCOPY   08/24/2011   Procedure: COLONOSCOPY;  Surgeon: Rogene Houston, MD;  [facility];  Service: Endoscopy;  Laterality: N/A;  1200  . CT guided biopsy  01/16/12  . GIVENS CAPSULE STUDY  01/29/2012   Procedure: GIVENS CAPSULE STUDY;  Surgeon: Rogene Houston, MD;  [facility];  Service: Endoscopy;  Laterality: N/A;  730  . KNEE SURGERY    . LAPAROTOMY  03/01/2012   Procedure: EXPLORATORY LAPAROTOMY;  Surgeon: Earnstine Regal, MD;  Location: [facility];  Service: General;  Laterality: N/A;  Exploratory Laparotomy ,Resection Retroperitoneal Mass  . NASAL SINUS SURGERY     30 yrs ago  . PACEMAKER IMPLANT N/A 07/18/2017   Procedure: PACEMAKER IMPLANT;  Surgeon: Evans Lance, MD;  Location: [facility];  Service: Cardiovascular;  Laterality: N/A;  . TOTAL KNEE ARTHROPLASTY Bilateral    2005 and 2011     Family History  Problem Relation Age of Onset  . Inflammatory bowel disease Maternal Grandmother      Social History   Socioeconomic History  . Marital status: Widowed    Spouse name: Not on file  . Number of children: Not on file  . Years of education: Not on file  . Highest education level: Not on file  Occupational History  . Occupation: Retired Quarry manager  Social Needs  . Financial resource strain: Not on file  . Food insecurity:    Worry: Not on file    Inability: Not on file  . Transportation needs:    Medical: Not on file    Non-medical: Not on file  Tobacco Use  . Smoking status: Never Smoker  . Smokeless tobacco: Never Used  Substance and Sexual Activity  . Alcohol use: No    Alcohol/week: 0.0 standard drinks  . Drug use: No  . Sexual activity: Not on file  Lifestyle  . Physical activity:    Days per week: Not on file    Minutes per session: Not on file  . Stress: Not on file  Relationships  . Social connections:    Talks on phone: Not on file    Gets together: Not on file    Attends religious service: Not on file    Active member of  club or organization: Not on file    Attends meetings of clubs or organizations: Not on file    Relationship status: Not on file  . Intimate partner violence:    Fear of current or ex partner: Not on file    Emotionally abused: Not on file  Physically abused: Not on file    Forced sexual activity: Not on file  Other Topics Concern  . Not on file  Social History Narrative   Lives in [place] alone.  Retired Quarry manager.   Caffeine use:    Right handed     BP (!) 160/80   Pulse 80   Ht 5\' 4"  (1.626 m)   Wt 200 lb 9.6 oz (91 kg)   SpO2 98%   BMI 34.43 kg/m   Physical Exam:  Well appearing NAD HEENT: Unremarkable Neck:  No JVD, no thyromegally Lymphatics:  No adenopathy Back:  No CVA tenderness Lungs:  Clear with no wheezes HEART:  Regular rate rhythm, no murmurs, no rubs, no clicks Abd:  soft, positive bowel sounds, no organomegally, no rebound, no guarding Ext:  2 plus pulses, no edema, no cyanosis, no clubbing Skin:  No rashes no nodules Neuro:  CN II through XII intact, motor grossly intact  EKG - none  DEVICE  Normal device function.  See PaceArt for details.   Assess/Plan: 1. Atrial fib - her rates are controlled. She will switch to atenolol 25 mg daily and stop inderal. 2. HTN - her blood pressure was up today though better at another doctor's office earlier. Hopefully the atenolol will help with this.  Mikle Bosworth.D.

## 2018-02-21 ENCOUNTER — Ambulatory Visit (HOSPITAL_COMMUNITY)
Admission: RE | Admit: 2018-02-21 | Discharge: 2018-02-21 | Disposition: A | Discharge status: Home / Self Care | Payer: Medicare Other | Source: Ambulatory Visit | Attending: Adult Health Nurse Practitioner | Admitting: Adult Health Nurse Practitioner

## 2018-02-21 DIAGNOSIS — Z1231 Encounter for screening mammogram for malignant neoplasm of breast: Secondary | ICD-10-CM | POA: Diagnosis not present

## 2018-03-26 ENCOUNTER — Ambulatory Visit: Payer: Medicare Other

## 2018-03-28 DIAGNOSIS — I10 Essential (primary) hypertension: Secondary | ICD-10-CM | POA: Diagnosis not present

## 2018-03-28 DIAGNOSIS — N183 Chronic kidney disease, stage 3 (moderate): Secondary | ICD-10-CM | POA: Diagnosis not present

## 2018-03-28 DIAGNOSIS — E782 Mixed hyperlipidemia: Secondary | ICD-10-CM | POA: Diagnosis not present

## 2018-03-28 DIAGNOSIS — E039 Hypothyroidism, unspecified: Secondary | ICD-10-CM | POA: Diagnosis not present

## 2018-04-02 ENCOUNTER — Ambulatory Visit (INDEPENDENT_AMBULATORY_CARE_PROVIDER_SITE_OTHER): Payer: Medicare Other

## 2018-04-02 VITALS — BP 142/80 | Ht 66.0 in | Wt 201.0 lb

## 2018-04-02 DIAGNOSIS — I480 Paroxysmal atrial fibrillation: Secondary | ICD-10-CM

## 2018-04-02 LAB — CUP PACEART REMOTE DEVICE CHECK
Battery Remaining Longevity: 148 mo
Battery Voltage: 3.11 V
Brady Statistic AP VP Percent: 0.66 %
Brady Statistic AS VS Percent: 2.24 %
Brady Statistic RA Percent Paced: 97.93 %
Brady Statistic RV Percent Paced: 0.69 %
Implantable Lead Implant Date: 20190501
Implantable Lead Implant Date: 20190501
Implantable Lead Location: 753860
Implantable Lead Model: 3830
Implantable Lead Model: 5076
Implantable Pulse Generator Implant Date: 20190501
Lead Channel Impedance Value: 323 Ohm
Lead Channel Impedance Value: 361 Ohm
Lead Channel Impedance Value: 399 Ohm
Lead Channel Impedance Value: 494 Ohm
Lead Channel Pacing Threshold Amplitude: 0.75 V
Lead Channel Pacing Threshold Pulse Width: 0.4 ms
Lead Channel Sensing Intrinsic Amplitude: 0.875 mV
Lead Channel Sensing Intrinsic Amplitude: 0.875 mV
Lead Channel Sensing Intrinsic Amplitude: 8.25 mV
Lead Channel Sensing Intrinsic Amplitude: 8.25 mV
Lead Channel Setting Pacing Amplitude: 2 V
Lead Channel Setting Pacing Amplitude: 2.5 V
Lead Channel Setting Pacing Pulse Width: 0.4 ms
Lead Channel Setting Sensing Sensitivity: 0.9 mV
MDC IDC LEAD LOCATION: 753859
MDC IDC MSMT LEADCHNL RA PACING THRESHOLD PULSEWIDTH: 0.4 ms
MDC IDC MSMT LEADCHNL RV PACING THRESHOLD AMPLITUDE: 0.75 V
MDC IDC SESS DTM: 20191118050628
MDC IDC STAT BRADY AP VS PERCENT: 97.1 %
MDC IDC STAT BRADY AS VP PERCENT: 0 %

## 2018-04-02 NOTE — Patient Instructions (Signed)
Pt notified we will forward bp & ekg to Dr. Lovena Le for review. Pt voiced understanding.

## 2018-04-02 NOTE — Progress Notes (Signed)
Pt came in for a blood pressure check and an EKG. Pt states that she feels fine overall. She has only had 1 bad headache since her medication change. Will forward to Dr. Lovena Le to review.

## 2018-04-03 DIAGNOSIS — E782 Mixed hyperlipidemia: Secondary | ICD-10-CM | POA: Diagnosis not present

## 2018-04-03 DIAGNOSIS — G7000 Myasthenia gravis without (acute) exacerbation: Secondary | ICD-10-CM | POA: Diagnosis not present

## 2018-04-03 DIAGNOSIS — G8929 Other chronic pain: Secondary | ICD-10-CM | POA: Diagnosis not present

## 2018-04-03 DIAGNOSIS — E039 Hypothyroidism, unspecified: Secondary | ICD-10-CM | POA: Diagnosis not present

## 2018-04-03 DIAGNOSIS — I10 Essential (primary) hypertension: Secondary | ICD-10-CM | POA: Diagnosis not present

## 2018-04-03 DIAGNOSIS — F331 Major depressive disorder, recurrent, moderate: Secondary | ICD-10-CM | POA: Diagnosis not present

## 2018-04-03 DIAGNOSIS — K219 Gastro-esophageal reflux disease without esophagitis: Secondary | ICD-10-CM | POA: Diagnosis not present

## 2018-04-03 DIAGNOSIS — N183 Chronic kidney disease, stage 3 (moderate): Secondary | ICD-10-CM | POA: Diagnosis not present

## 2018-04-03 DIAGNOSIS — G4700 Insomnia, unspecified: Secondary | ICD-10-CM | POA: Diagnosis not present

## 2018-04-03 DIAGNOSIS — I4891 Unspecified atrial fibrillation: Secondary | ICD-10-CM | POA: Diagnosis not present

## 2018-04-23 DIAGNOSIS — L304 Erythema intertrigo: Secondary | ICD-10-CM | POA: Diagnosis not present

## 2018-04-23 DIAGNOSIS — Z08 Encounter for follow-up examination after completed treatment for malignant neoplasm: Secondary | ICD-10-CM | POA: Diagnosis not present

## 2018-04-23 DIAGNOSIS — B351 Tinea unguium: Secondary | ICD-10-CM | POA: Diagnosis not present

## 2018-04-23 DIAGNOSIS — Z1283 Encounter for screening for malignant neoplasm of skin: Secondary | ICD-10-CM | POA: Diagnosis not present

## 2018-04-23 DIAGNOSIS — Z85828 Personal history of other malignant neoplasm of skin: Secondary | ICD-10-CM | POA: Diagnosis not present

## 2018-05-02 DIAGNOSIS — Z961 Presence of intraocular lens: Secondary | ICD-10-CM | POA: Diagnosis not present

## 2018-05-06 ENCOUNTER — Ambulatory Visit (INDEPENDENT_AMBULATORY_CARE_PROVIDER_SITE_OTHER): Payer: Medicare Other

## 2018-05-06 DIAGNOSIS — R001 Bradycardia, unspecified: Secondary | ICD-10-CM | POA: Diagnosis not present

## 2018-05-07 ENCOUNTER — Ambulatory Visit (INDEPENDENT_AMBULATORY_CARE_PROVIDER_SITE_OTHER): Payer: Medicare Other

## 2018-05-07 DIAGNOSIS — R001 Bradycardia, unspecified: Secondary | ICD-10-CM

## 2018-05-08 ENCOUNTER — Telehealth: Payer: Self-pay

## 2018-05-08 LAB — CUP PACEART REMOTE DEVICE CHECK
Battery Remaining Longevity: 148 mo
Battery Voltage: 3.07 V
Brady Statistic AP VP Percent: 0.36 %
Brady Statistic AP VS Percent: 98.4 %
Brady Statistic AS VP Percent: 0 %
Brady Statistic AS VS Percent: 1.24 %
Brady Statistic RA Percent Paced: 99.06 %
Brady Statistic RV Percent Paced: 0.37 %
Date Time Interrogation Session: 20200218211230
Implantable Lead Implant Date: 20190501
Implantable Lead Implant Date: 20190501
Implantable Lead Location: 753859
Implantable Lead Location: 753860
Implantable Lead Model: 3830
Implantable Lead Model: 5076
Implantable Pulse Generator Implant Date: 20190501
Lead Channel Impedance Value: 342 Ohm
Lead Channel Impedance Value: 361 Ohm
Lead Channel Impedance Value: 456 Ohm
Lead Channel Impedance Value: 475 Ohm
Lead Channel Pacing Threshold Amplitude: 0.75 V
Lead Channel Pacing Threshold Amplitude: 0.875 V
Lead Channel Pacing Threshold Pulse Width: 0.4 ms
Lead Channel Pacing Threshold Pulse Width: 0.4 ms
Lead Channel Sensing Intrinsic Amplitude: 1 mV
Lead Channel Sensing Intrinsic Amplitude: 7.625 mV
Lead Channel Sensing Intrinsic Amplitude: 7.625 mV
Lead Channel Setting Pacing Amplitude: 2 V
Lead Channel Setting Pacing Amplitude: 2.5 V
Lead Channel Setting Pacing Pulse Width: 0.4 ms
Lead Channel Setting Sensing Sensitivity: 0.9 mV
MDC IDC MSMT LEADCHNL RA SENSING INTR AMPL: 1 mV

## 2018-05-08 NOTE — Telephone Encounter (Signed)
Spoke with patient to remind of missed remote transmission 

## 2018-05-15 NOTE — Progress Notes (Signed)
Remote pacemaker transmission.   

## 2018-05-20 ENCOUNTER — Encounter: Payer: Self-pay | Admitting: Cardiology

## 2018-05-28 ENCOUNTER — Encounter: Payer: Self-pay | Admitting: Neurology

## 2018-05-28 ENCOUNTER — Ambulatory Visit (INDEPENDENT_AMBULATORY_CARE_PROVIDER_SITE_OTHER): Payer: Medicare Other | Admitting: Neurology

## 2018-05-28 VITALS — BP 120/68 | HR 70 | Ht 66.0 in | Wt 196.3 lb

## 2018-05-28 DIAGNOSIS — G7000 Myasthenia gravis without (acute) exacerbation: Secondary | ICD-10-CM

## 2018-05-28 NOTE — Progress Notes (Signed)
Reason for visit: Myasthenia gravis  [name] is an 78 y.o. female  History of present illness:  [name] is a 78 year old right-handed white female with history of myasthenia gravis primarily with ocular features.  The patient is on Mestinon, she is not on any disease modifying agent such as CellCept or Imuran.  The patient has been switched off of propranolol, she now takes atenolol.  The patient is exercising on a regular basis to the [facility] with water aerobics.  She believes that she is gaining ground with her ability to perform physical tasks.  She has significant impairment of vision in the right eye, she has had cataract surgery on the left and this is the eye that she uses mainly to see.  The patient does have double vision that is ongoing, she denies any ptosis.  She denies any problems with speech or swallowing.  She returns to the office today for an evaluation.  Past Medical History:  Diagnosis Date  . Anemia   . Chronic back pain   . Chronic nausea   . Complication of anesthesia    Low blood pressure, heart rate, O2 sat  . Depression with anxiety   . Depression with anxiety   . Diverticulosis   . Dyspnea   . Essential hypertension   . GERD (gastroesophageal reflux disease)   . History of pneumonia   . Hypothyroidism   . IBS (irritable colon syndrome)   . LVH (left ventricular hypertrophy)   . Migraines   . Mixed hyperlipidemia   . Obesity   . Ocular myasthenia gravis ([place])   . Osteoarthritis of knee   . PAF (paroxysmal atrial fibrillation) (HCC)    chads2vasc score is at least 4  . Paraganglioma ([place])    Resection 02/2012 (retroperitoneal)  . Paraganglioma, malignant ([place]) 11/06/2015  . Presence of permanent cardiac pacemaker 07/18/2017  . Renal insufficiency   . Skin cancer     Past Surgical History:  Procedure Laterality Date  . ABDOMINAL HYSTERECTOMY    . ABDOMINAL HYSTERECTOMY    . CARDIAC CATHETERIZATION    . CHOLECYSTECTOMY    . COLONOSCOPY   08/24/2011   Procedure: COLONOSCOPY;  Surgeon: Rogene Houston, MD;  [facility];  Service: Endoscopy;  Laterality: N/A;  1200  . CT guided biopsy  01/16/12  . GIVENS CAPSULE STUDY  01/29/2012   Procedure: GIVENS CAPSULE STUDY;  Surgeon: Rogene Houston, MD;  [facility];  Service: Endoscopy;  Laterality: N/A;  730  . KNEE SURGERY    . LAPAROTOMY  03/01/2012   Procedure: EXPLORATORY LAPAROTOMY;  Surgeon: Earnstine Regal, MD;  [facility];  Service: General;  Laterality: N/A;  Exploratory Laparotomy ,Resection Retroperitoneal Mass  . NASAL SINUS SURGERY     30 yrs ago  . PACEMAKER IMPLANT N/A 07/18/2017   Procedure: PACEMAKER IMPLANT;  Surgeon: Evans Lance, MD;  Location: [facility];  Service: Cardiovascular;  Laterality: N/A;  . TOTAL KNEE ARTHROPLASTY Bilateral    2005 and 2011    Family History  Problem Relation Age of Onset  . Inflammatory bowel disease Maternal Grandmother     Social history:  reports that she has never smoked. She has never used smokeless tobacco. She reports that she does not drink alcohol or use drugs.    Allergies  Allergen Reactions  . Nitrofurantoin Macrocrystal Nausea And Vomiting  . Ampicillin Rash  . Avocado Diarrhea and Nausea And Vomiting  .  Biaxin [Clarithromycin] Rash  . Doxycycline Monohydrate Nausea And Vomiting  . Moxifloxacin     Contraindication myasthenia gravis  . Petrolatum-Zinc Oxide Rash  . Tetracyclines & Related Nausea And Vomiting  . Tylox [Oxycodone-Acetaminophen]     Severe nausea  . Codeine Itching    Can not take Codeine unless in cough syrup Can not take Codeine unless in cough syrup    Medications:  Prior to Admission medications   Medication Sig Start Date End Date Taking? Authorizing Provider  ALPRAZolam Duanne Moron) 1 MG tablet Take 1 mg by mouth at bedtime.   Yes [provider]  atenolol (TENORMIN) 25 MG tablet Take one tablet by mouth daily.  May take an additional tablet  for systolic >161 or severe palpitations. 02/18/18  Yes Evans Lance, MD  cholecalciferol (VITAMIN D) 1000 units tablet Take 1,000 Units by mouth daily.   Yes [provider]  Coenzyme Q10 (CO Q 10) 10 MG CAPS Take 10 mg by mouth every evening.    Yes [provider]  Cyanocobalamin (B-12) 3000 MCG SUBL Place 1 tablet under the tongue every evening. Time release   Yes [provider]  diclofenac sodium (VOLTAREN) 1 % GEL Apply 4 g topically 4 (four) times daily as needed (for pain).  01/13/15  Yes [provider]  ELIQUIS 5 MG TABS tablet TAKE (1) TABLET BY MOUTH TWICE DAILY. 12/26/17  Yes Herminio Commons, MD  HYDROcodone-acetaminophen (NORCO) 10-325 MG per tablet Take 1 tablet by mouth every 8 (eight) hours as needed (pain).    Yes [provider]  lansoprazole (PREVACID) 30 MG capsule TAKE (1) CAPSULE BY MOUTH TWICE DAILY. 11/05/17  Yes Setzer, Rona Ravens, NP  levothyroxine (SYNTHROID, LEVOTHROID) 100 MCG tablet Take 100 mcg by mouth daily before breakfast.  02/05/15  Yes [provider]  lidocaine (LIDODERM) 5 % Place 1-3 patches onto the skin daily as needed (for pain). Remove & Discard patch within 12 hours or as directed by MD for pain   Yes [provider]  Magnesium 250 MG TABS Take 250 mg by mouth every evening.    Yes [provider]  OVER THE COUNTER MEDICATION Place 1 drop into both eyes 4 (four) times daily. Sooth XP Eye drops uses four times daily.    Yes [provider]  oxyCODONE-acetaminophen (PERCOCET) 10-325 MG tablet  05/14/18  Yes [provider]  pravastatin (PRAVACHOL) 80 MG tablet Take 80 mg by mouth every evening.    Yes [provider]  pyridostigmine (MESTINON) 60 MG tablet Take 0.5 tablets (30 mg total) by mouth 4 (four) times daily. 11/13/17  Yes Kathrynn Ducking, MD  SUMAtriptan (IMITREX) 50 MG tablet Take 50 mg by mouth every 2 (two) hours as needed. For migraines   Yes  [provider]    ROS:  Out of a complete 14 system review of symptoms, the patient complains only of the following symptoms, and all other reviewed systems are negative.  Increased activity Double vision Palpitations of the heart, heart murmur Cold intolerance Insomnia, frequent waking, snoring, sleep talking Food allergies Joint pain, joint swelling, back pain, aching muscles, muscle cramps, walking difficulty, neck pain Moles, bruising easily Depression   Blood pressure 120/68, pulse 70, height 5\' 6"  (1.676 m), weight 196 lb 5 oz (89 kg), SpO2 97 %.  Physical Exam  General: The patient is alert and cooperative at the time of the examination.  The patient is minimally obese.  Skin: No  significant peripheral edema is noted.   Neurologic Exam  Mental status: The patient is alert and oriented x 3 at the time of the examination. The patient has apparent normal recent and remote memory, with an apparently normal attention span and concentration ability.   Cranial nerves: Facial symmetry is present. Speech is normal, no aphasia or dysarthria is noted. Extraocular movements are full.  There appears to be medial deviation of the left eye preferential to the right at times.  Visual fields are full.  With superior gaze for 1 minute, no ptosis or alteration in divergence of gaze is seen.  The patient reports double vision at all times.  Motor: The patient has good strength in all 4 extremities.  With arms outstretched for 1 minute, there is slight fatigable weakness of the deltoid muscles bilaterally, right greater than left.  Sensory examination: Soft touch sensation is symmetric on the face, arms, and legs.  Coordination: The patient has good finger-nose-finger and heel-to-shin bilaterally.  Gait and station: The patient has a wide-based gait, she usually uses a cane for ambulation.  Tandem gait not attempted.  Romberg is negative.  Reflexes: Deep tendon reflexes are  symmetric.   Assessment/Plan:  1.  Myasthenia gravis primarily with ocular features  2.  Migraine headache  The patient indicates that her migraine is doing quite well at this time, she is having less than 1 headache a month.  She may take Imitrex for this, she denies a history of coronary artery disease.  The patient still has some mild fatigable weakness of the deltoid muscles, this is unchanged from last summer.  The patient believes that her abilities to perform physical activities have been increased over time.  The patient will continue the Mestinon.  She will follow-up in 6 months.  Jill Alexanders MD 05/28/2018 12:55 PM  [facility] [address] [place], [place] [address]  Phone [phone] Fax [phone]

## 2018-06-07 ENCOUNTER — Telehealth: Payer: Self-pay | Admitting: *Deleted

## 2018-06-07 NOTE — Telephone Encounter (Signed)
Spoke with patient, advised that she doesn't need to send multiple PPM transmissions each time she transmits. She reports that she has poor vision and has trouble understanding the instructions on the monitor. Educated pt about monitor and explained transmission schedule. Pt verbalizes understanding and thanked me for my call.

## 2018-07-09 ENCOUNTER — Other Ambulatory Visit: Payer: Self-pay | Admitting: Cardiovascular Disease

## 2018-07-19 ENCOUNTER — Telehealth: Payer: Self-pay | Admitting: Internal Medicine

## 2018-07-19 NOTE — Telephone Encounter (Signed)
Pt was called and updated with transmission that was reviewed., pt was appreciative of phone call.

## 2018-07-19 NOTE — Telephone Encounter (Signed)
STAT if patient feels like he/she is going to faint   1) Are you dizzy now? yes  2) Do you feel faint or have you passed out? Feels lightheaded and off balance when she walks  3) Do you have any other symptoms? Short of breath, aA little more when she walks around  and heart have been skipping some*  4) Have you checked your HR and BP (record if available)?no

## 2018-07-19 NOTE — Telephone Encounter (Signed)
Spoke with daughter - they will send transmission

## 2018-07-19 NOTE — Telephone Encounter (Signed)
Transmission reviewed. Presenting rhythm AP/VS. No arrhythmias, normal histogram, normal lead function. When speaking with patient earlier, she stated felt like PVC's (she is a retired Quarry manager). Will send back to Jenny/Dr Lovena Le.   Chanetta Marshall, NP 07/19/2018 2:04 PM

## 2018-07-23 DIAGNOSIS — C755 Malignant neoplasm of aortic body and other paraganglia: Secondary | ICD-10-CM | POA: Diagnosis not present

## 2018-07-23 DIAGNOSIS — Z950 Presence of cardiac pacemaker: Secondary | ICD-10-CM | POA: Diagnosis not present

## 2018-07-23 DIAGNOSIS — I480 Paroxysmal atrial fibrillation: Secondary | ICD-10-CM | POA: Diagnosis not present

## 2018-07-23 DIAGNOSIS — Z1589 Genetic susceptibility to other disease: Secondary | ICD-10-CM | POA: Diagnosis not present

## 2018-07-30 DIAGNOSIS — Z1589 Genetic susceptibility to other disease: Secondary | ICD-10-CM | POA: Diagnosis not present

## 2018-07-30 DIAGNOSIS — I480 Paroxysmal atrial fibrillation: Secondary | ICD-10-CM | POA: Diagnosis not present

## 2018-07-30 DIAGNOSIS — Z950 Presence of cardiac pacemaker: Secondary | ICD-10-CM | POA: Diagnosis not present

## 2018-07-30 DIAGNOSIS — C755 Malignant neoplasm of aortic body and other paraganglia: Secondary | ICD-10-CM | POA: Diagnosis not present

## 2018-07-31 DIAGNOSIS — I10 Essential (primary) hypertension: Secondary | ICD-10-CM | POA: Diagnosis not present

## 2018-07-31 DIAGNOSIS — E039 Hypothyroidism, unspecified: Secondary | ICD-10-CM | POA: Diagnosis not present

## 2018-07-31 DIAGNOSIS — N183 Chronic kidney disease, stage 3 (moderate): Secondary | ICD-10-CM | POA: Diagnosis not present

## 2018-07-31 DIAGNOSIS — E782 Mixed hyperlipidemia: Secondary | ICD-10-CM | POA: Diagnosis not present

## 2018-08-06 ENCOUNTER — Ambulatory Visit (INDEPENDENT_AMBULATORY_CARE_PROVIDER_SITE_OTHER): Payer: Medicare Other | Admitting: *Deleted

## 2018-08-06 ENCOUNTER — Other Ambulatory Visit: Payer: Self-pay

## 2018-08-06 ENCOUNTER — Telehealth: Payer: Self-pay | Admitting: Internal Medicine

## 2018-08-06 DIAGNOSIS — R001 Bradycardia, unspecified: Secondary | ICD-10-CM | POA: Diagnosis not present

## 2018-08-06 NOTE — Telephone Encounter (Signed)
Virtual Visit Pre-Appointment Phone Call  "(Name), I am calling you today to discuss your upcoming appointment. We are currently trying to limit exposure to the virus that causes COVID-19 by seeing patients at home rather than in the office."  1. "What is the BEST phone number to call the day of the visit?" - include this in appointment notes  2. Do you have or have access to (through a family member/friend) a smartphone with video capability that we can use for your visit?" a. If yes - list this number in appt notes as cell (if different from BEST phone #) and list the appointment type as a VIDEO visit in appointment notes b. If no - list the appointment type as a PHONE visit in appointment notes  3. Confirm consent - "In the setting of the current Covid19 crisis, you are scheduled for a (phone or video) visit with your provider on (date) at (time).  Just as we do with many in-office visits, in order for you to participate in this visit, we must obtain consent.  If you'd like, I can send this to your mychart (if signed up) or email for you to review.  Otherwise, I can obtain your verbal consent now.  All virtual visits are billed to your insurance company just like a normal visit would be.  By agreeing to a virtual visit, we'd like you to understand that the technology does not allow for your provider to perform an examination, and thus may limit your provider's ability to fully assess your condition. If your provider identifies any concerns that need to be evaluated in person, we will make arrangements to do so.  Finally, though the technology is pretty good, we cannot assure that it will always work on either your or our end, and in the setting of a video visit, we may have to convert it to a phone-only visit.  In either situation, we cannot ensure that we have a secure connection.  Are you willing to proceed?" STAFF: Did the patient verbally acknowledge consent to telehealth visit? Document  YES/NO here: Yes  4. Advise patient to be prepared - "Two hours prior to your appointment, go ahead and check your blood pressure, pulse, oxygen saturation, and your weight (if you have the equipment to check those) and write them all down. When your visit starts, your provider will ask you for this information. If you have an Apple Watch or Kardia device, please plan to have heart rate information ready on the day of your appointment. Please have a pen and paper handy nearby the day of the visit as well."  5. Give patient instructions for MyChart download to smartphone OR [email or web address] as below if video visit (depending on what platform provider is using)  6. Inform patient they will receive a phone call 15 minutes prior to their appointment time (may be from unknown caller ID) so they should be prepared to answer    TELEPHONE CALL NOTE  [name] has been deemed a candidate for a follow-up tele-health visit to limit community exposure during the Covid-19 pandemic. I spoke with the patient via phone to ensure availability of phone/video source, confirm preferred email & phone number, and discuss instructions and expectations.  I reminded [name] to be prepared with any vital sign and/or heart rhythm information that could potentially be obtained via home monitoring, at the time of her visit. I reminded [name] to expect a phone call prior to  her visit.  Albion Goins 08/06/2018 4:02 PM

## 2018-08-07 DIAGNOSIS — G4700 Insomnia, unspecified: Secondary | ICD-10-CM | POA: Diagnosis not present

## 2018-08-07 DIAGNOSIS — N183 Chronic kidney disease, stage 3 (moderate): Secondary | ICD-10-CM | POA: Diagnosis not present

## 2018-08-07 DIAGNOSIS — G7000 Myasthenia gravis without (acute) exacerbation: Secondary | ICD-10-CM | POA: Diagnosis not present

## 2018-08-07 DIAGNOSIS — K219 Gastro-esophageal reflux disease without esophagitis: Secondary | ICD-10-CM | POA: Diagnosis not present

## 2018-08-07 DIAGNOSIS — E782 Mixed hyperlipidemia: Secondary | ICD-10-CM | POA: Diagnosis not present

## 2018-08-07 DIAGNOSIS — I129 Hypertensive chronic kidney disease with stage 1 through stage 4 chronic kidney disease, or unspecified chronic kidney disease: Secondary | ICD-10-CM | POA: Diagnosis not present

## 2018-08-07 DIAGNOSIS — E039 Hypothyroidism, unspecified: Secondary | ICD-10-CM | POA: Diagnosis not present

## 2018-08-07 DIAGNOSIS — Z0001 Encounter for general adult medical examination with abnormal findings: Secondary | ICD-10-CM | POA: Diagnosis not present

## 2018-08-07 DIAGNOSIS — I482 Chronic atrial fibrillation, unspecified: Secondary | ICD-10-CM | POA: Diagnosis not present

## 2018-08-07 DIAGNOSIS — Z79891 Long term (current) use of opiate analgesic: Secondary | ICD-10-CM | POA: Diagnosis not present

## 2018-08-07 DIAGNOSIS — Z950 Presence of cardiac pacemaker: Secondary | ICD-10-CM | POA: Diagnosis not present

## 2018-08-07 DIAGNOSIS — G894 Chronic pain syndrome: Secondary | ICD-10-CM | POA: Diagnosis not present

## 2018-08-07 LAB — CUP PACEART REMOTE DEVICE CHECK
Battery Remaining Longevity: 143 mo
Battery Voltage: 3.04 V
Brady Statistic AP VP Percent: 0.26 %
Brady Statistic AP VS Percent: 98.29 %
Brady Statistic AS VP Percent: 0 %
Brady Statistic AS VS Percent: 1.44 %
Brady Statistic RA Percent Paced: 99.54 %
Brady Statistic RV Percent Paced: 0.28 %
Date Time Interrogation Session: 20200519053558
Implantable Lead Implant Date: 20190501
Implantable Lead Implant Date: 20190501
Implantable Lead Location: 753859
Implantable Lead Location: 753860
Implantable Lead Model: 3830
Implantable Lead Model: 5076
Implantable Pulse Generator Implant Date: 20190501
Lead Channel Impedance Value: 323 Ohm
Lead Channel Impedance Value: 342 Ohm
Lead Channel Impedance Value: 418 Ohm
Lead Channel Impedance Value: 475 Ohm
Lead Channel Pacing Threshold Amplitude: 0.75 V
Lead Channel Pacing Threshold Amplitude: 0.875 V
Lead Channel Pacing Threshold Pulse Width: 0.4 ms
Lead Channel Pacing Threshold Pulse Width: 0.4 ms
Lead Channel Sensing Intrinsic Amplitude: 1.125 mV
Lead Channel Sensing Intrinsic Amplitude: 1.125 mV
Lead Channel Sensing Intrinsic Amplitude: 8.375 mV
Lead Channel Sensing Intrinsic Amplitude: 8.375 mV
Lead Channel Setting Pacing Amplitude: 2 V
Lead Channel Setting Pacing Amplitude: 2.5 V
Lead Channel Setting Pacing Pulse Width: 0.4 ms
Lead Channel Setting Sensing Sensitivity: 0.9 mV

## 2018-08-08 ENCOUNTER — Encounter: Payer: Self-pay | Admitting: Internal Medicine

## 2018-08-09 ENCOUNTER — Telehealth (INDEPENDENT_AMBULATORY_CARE_PROVIDER_SITE_OTHER): Payer: Medicare Other | Admitting: Internal Medicine

## 2018-08-09 ENCOUNTER — Telehealth: Payer: Self-pay | Admitting: Internal Medicine

## 2018-08-09 VITALS — Ht 64.0 in | Wt 195.0 lb

## 2018-08-09 DIAGNOSIS — I10 Essential (primary) hypertension: Secondary | ICD-10-CM

## 2018-08-09 DIAGNOSIS — R001 Bradycardia, unspecified: Secondary | ICD-10-CM | POA: Diagnosis not present

## 2018-08-09 NOTE — Patient Instructions (Signed)
Medication Instructions: Your physician recommends that you continue on your current medications as directed. Please refer to the Current Medication list given to you today.   Labwork: None today  Procedures/Testing: None today  Follow-Up: 1 year with Dr.Taylor  Any Additional Special Instructions Will Be Listed Below (If Applicable).     If you need a refill on your cardiac medications before your next appointment, please call your pharmacy.     Thank you for choosing [facility] !        

## 2018-08-09 NOTE — Telephone Encounter (Signed)
New Message    Pt says she needs a letter from Dr Lovena Le because she has a pacemaker. She needs to have some testing done and is required to have documentation Please call [name]     Please call

## 2018-08-09 NOTE — Telephone Encounter (Signed)
[facility], they need guidance on programming for MRI scan.  Recent remote reviewed. Pt atrially paces >99% of the time. Her device is MRI compatible. She will have them fax over form that needs to be completed. I advised that we are not back in the office until Tuesday but will complete then.   Chanetta Marshall, NP 08/09/2018 3:43 PM

## 2018-08-09 NOTE — Progress Notes (Signed)
Electrophysiology TeleHealth Note   Due to national recommendations of social distancing due to COVID 19, an audio/video telehealth visit is felt to be most appropriate for this patient at this time.  See MyChart message from today for the patient's consent to telehealth for [facility].   Date:  08/09/2018   ID:  [name], DOB 06/22/1940, MRN [id number]  Location: patient's home  Provider location: [address], [place]  Evaluation Performed: Follow-up visit  PCP:  [name], MD  Cardiologist:  Kate Sable, MD  Electrophysiologist:  Dr Lovena Le  Chief Complaint: " I am having palpitations. "  History of Present Illness:    [name] is a 78 y.o. female who presents via audio/video conferencing for a telehealth visit today.She is a pleasant 78 yo woman with symptomatic bradycardia, s/p PPM insertion. She has myasthenia. She has PVC's. Today, she denies symptoms of palpitations, chest pain, shortness of breath,  lower extremity edema, dizziness, presyncope, or syncope.  The patient is otherwise without complaint today.  The patient denies symptoms of fevers, chills, cough, or new SOB worrisome for COVID 19.  Past Medical History:  Diagnosis Date  . Anemia   . Chronic back pain   . Chronic nausea   . Complication of anesthesia    Low blood pressure, heart rate, O2 sat  . Depression with anxiety   . Depression with anxiety   . Diverticulosis   . Dyspnea   . Essential hypertension   . GERD (gastroesophageal reflux disease)   . History of pneumonia   . Hypothyroidism   . IBS (irritable colon syndrome)   . LVH (left ventricular hypertrophy)   . Migraines   . Mixed hyperlipidemia   . Obesity   . Ocular myasthenia gravis ([place])   . Osteoarthritis of knee   . PAF (paroxysmal atrial fibrillation) (HCC)    chads2vasc score is at least 4  . Paraganglioma ([place])    Resection 02/2012 (retroperitoneal)  . Paraganglioma, malignant ([place]) 11/06/2015   . Presence of permanent cardiac pacemaker 07/18/2017  . Renal insufficiency   . Skin cancer     Past Surgical History:  Procedure Laterality Date  . ABDOMINAL HYSTERECTOMY    . ABDOMINAL HYSTERECTOMY    . CARDIAC CATHETERIZATION    . CHOLECYSTECTOMY    . COLONOSCOPY  08/24/2011   Procedure: COLONOSCOPY;  Surgeon: Rogene Houston, MD;  [facility];  Service: Endoscopy;  Laterality: N/A;  1200  . CT guided biopsy  01/16/12  . GIVENS CAPSULE STUDY  01/29/2012   Procedure: GIVENS CAPSULE STUDY;  Surgeon: Rogene Houston, MD;  Location: [facility];  Service: Endoscopy;  Laterality: N/A;  730  . KNEE SURGERY    . LAPAROTOMY  03/01/2012   Procedure: EXPLORATORY LAPAROTOMY;  Surgeon: Earnstine Regal, MD;  Location: [facility];  Service: General;  Laterality: N/A;  Exploratory Laparotomy ,Resection Retroperitoneal Mass  . NASAL SINUS SURGERY     30 yrs ago  . PACEMAKER IMPLANT N/A 07/18/2017   Procedure: PACEMAKER IMPLANT;  Surgeon: Evans Lance, MD;  Location: [facility];  Service: Cardiovascular;  Laterality: N/A;  . TOTAL KNEE ARTHROPLASTY Bilateral    2005 and 2011    Current Outpatient Medications  Medication Sig Dispense Refill  . ALPRAZolam (XANAX) 1 MG tablet Take 1 mg by mouth at bedtime.    Marland Kitchen atenolol (TENORMIN) 25 MG tablet Take one tablet by mouth daily.  May take an additional tablet for systolic >366 or severe palpitations. 180 tablet 3  . b complex vitamins tablet Take 1 tablet by mouth daily.    Marland Kitchen buPROPion (WELLBUTRIN XL) 150 MG 24 hr tablet Take 150 mg by mouth daily.    . cholecalciferol (VITAMIN D) 1000 units tablet Take 1,000 Units by mouth daily.    . Coenzyme Q10 (CO Q 10) 10 MG CAPS Take 10 mg by mouth every evening.     . Cyanocobalamin (B-12) 3000 MCG SUBL Place 1 tablet under the tongue every evening. Time release    . diclofenac sodium (VOLTAREN) 1 % GEL Apply 4 g topically 4 (four) times daily as needed (for pain).     Marland Kitchen ELIQUIS 5 MG TABS  tablet TAKE (1) TABLET BY MOUTH TWICE DAILY. 180 tablet 1  . lansoprazole (PREVACID) 30 MG capsule TAKE (1) CAPSULE BY MOUTH TWICE DAILY. 180 capsule 3  . levothyroxine (SYNTHROID) 112 MCG tablet Take 112 mcg by mouth daily before breakfast.    . lidocaine (LIDODERM) 5 % Place 1-3 patches onto the skin daily as needed (for pain). Remove & Discard patch within 12 hours or as directed by MD for pain    . Magnesium 250 MG TABS Take 250 mg by mouth every evening.     Marland Kitchen OVER THE COUNTER MEDICATION Place 1 drop into both eyes 4 (four) times daily. Sooth XP Eye drops uses four times daily.     Marland Kitchen oxyCODONE-acetaminophen (PERCOCET) 10-325 MG tablet Take 1 tablet by mouth every 6 (six) hours as needed.     . pravastatin (PRAVACHOL) 80 MG tablet Take 80 mg by mouth every evening.     . pyridostigmine (MESTINON) 60 MG tablet Take 0.5 tablets (30 mg total) by mouth 4 (four) times daily. 180 tablet 3  . SUMAtriptan (IMITREX) 50 MG tablet Take 50 mg by mouth every 2 (two) hours as needed. For migraines     No current facility-administered medications for this visit.     Allergies:   Nitrofurantoin macrocrystal; Ampicillin; Avocado; Biaxin [clarithromycin]; Doxycycline monohydrate; Moxifloxacin; Petrolatum-zinc oxide; Tetracyclines & related; Tylox [oxycodone-acetaminophen]; and Codeine   Social History:  The patient  reports that she has never smoked. She has never used smokeless tobacco. She reports that she does not drink alcohol or use drugs.   Family History:  The patient's family history includes Inflammatory bowel disease in her maternal grandmother.   ROS:  Please see the history of present illness.   All other systems are personally reviewed and negative.    Exam:    Vital Signs:  Ht 5\' 4"  (1.626 m)   Wt 195 lb (88.5 kg)   BMI 33.47 kg/m  BP - 116/79, P - 74   Labs/Other Tests and Data Reviewed:    Recent Labs: 09/19/2017: BUN 23; Creatinine, Ser 1.13; Hemoglobin 14.1; Magnesium 2.3;  Platelets 170; Potassium 4.6; Sodium 140; TSH 1.760   Wt Readings from Last 3 Encounters:  08/08/18 195 lb (88.5 kg)  05/28/18 196 lb 5 oz (89 kg)  04/02/18 201 lb (91.2 kg)     Other studies personally reviewed:  Last device remote is reviewed from [facility] PDF dated  which reveals normal device function, no arrhythmias    ASSESSMENT & PLAN:    1.  PVC's - she has been encouraged to take an extra atenolol.  2. Sinus node dysfunction - she is s/p PPM insertion. She is asymptomatic.  3. COVID 19 screen The patient denies  symptoms of COVID 19 at this time.  The importance of social distancing was discussed today.  Follow-up:  12 months Next remote: 3 months  Current medicines are reviewed at length with the patient today.   The patient does not have concerns regarding her medicines.  The following changes were made today:  none  Labs/ tests ordered today include:  No orders of the defined types were placed in this encounter.    Patient Risk:  after full review of this patients clinical status, I feel that they are at moderate risk at this time.  Today, I have spent 25 minutes with the patient with telehealth technology discussing all of above .    Signed, Cristopher Peru, MD  08/09/2018 10:41 AM     [facility] [address] [place] [address] [phone] (office) [phone] (fax)

## 2018-08-13 ENCOUNTER — Other Ambulatory Visit: Payer: Self-pay | Admitting: Internal Medicine

## 2018-08-13 NOTE — Telephone Encounter (Signed)
Eliquis 5mg  refill request received; pt is 78 yrs old, wt-89kg, Crea-1.00 on 03/28/2018 via [facility] at [facility], last telemedicine appt by Dr. Lovena Le on 08/09/2018; will send in refill to request pharmacy.

## 2018-08-15 ENCOUNTER — Encounter: Payer: Self-pay | Admitting: Cardiology

## 2018-08-15 NOTE — Progress Notes (Signed)
Remote pacemaker transmission.   

## 2018-08-21 ENCOUNTER — Telehealth: Payer: Self-pay | Admitting: Internal Medicine

## 2018-08-21 NOTE — Telephone Encounter (Signed)
New Message    [name] is calling and says she sent a fax 2 times and is calling to follow up on it  It is for an MRI   Please call

## 2018-08-21 NOTE — Telephone Encounter (Signed)
Attempted to call Roselyn Reef at [place] at [facility] and hit option 2 and was placed on hold for over 20 mins and nobody picked up, and automated stated to "hold your call is important and we will get a representative to you as soon as possible." Also hit the Provider option as well, and was placed on hold, with no answer and no availability to leave a message. This call will have to be referred to, when a return call back is placed.  There is no clearance on this pt in medical records or in triage surgical clearance box.

## 2018-08-21 NOTE — Telephone Encounter (Signed)
Will route to Triage to see if there is a clearance for MRI.

## 2018-08-22 ENCOUNTER — Telehealth: Payer: Self-pay | Admitting: Internal Medicine

## 2018-08-22 NOTE — Telephone Encounter (Signed)
confidential voicemail   LMOVM for her to send form to my e-mail and I will forward email to the Device Tech RN.

## 2018-08-22 NOTE — Telephone Encounter (Signed)
New message   Per Roselyn Reef at [facility] she states that they are having an internal issue with fax machines and need to send a [facility] cardiology order form. She would like to know if there is any way that it can be emailed. Please advise.

## 2018-08-22 NOTE — Telephone Encounter (Signed)
I spoke to [place] about clearance for patient and told her to fax to [phone].  She will fax and I will notify Medical Records.

## 2018-08-23 NOTE — Telephone Encounter (Signed)
Left Jamie from MRI dept. a message informing her that we have not received the fax yet on the patient.

## 2018-08-23 NOTE — Telephone Encounter (Signed)
LMOVM for [name] to return call.

## 2018-08-26 NOTE — Telephone Encounter (Signed)
Forwarded e mail of form to Device clinic RN's.

## 2018-08-27 ENCOUNTER — Ambulatory Visit (INDEPENDENT_AMBULATORY_CARE_PROVIDER_SITE_OTHER): Payer: Medicare Other | Admitting: Internal Medicine

## 2018-08-28 ENCOUNTER — Encounter (INDEPENDENT_AMBULATORY_CARE_PROVIDER_SITE_OTHER): Payer: Self-pay | Admitting: Internal Medicine

## 2018-08-28 ENCOUNTER — Ambulatory Visit (INDEPENDENT_AMBULATORY_CARE_PROVIDER_SITE_OTHER): Payer: Medicare Other | Admitting: Internal Medicine

## 2018-08-28 ENCOUNTER — Other Ambulatory Visit: Payer: Self-pay

## 2018-08-28 VITALS — BP 163/92 | HR 64 | Temp 98.6°F | Resp 18 | Ht 63.0 in | Wt 194.0 lb

## 2018-08-28 DIAGNOSIS — K219 Gastro-esophageal reflux disease without esophagitis: Secondary | ICD-10-CM

## 2018-08-28 DIAGNOSIS — R0789 Other chest pain: Secondary | ICD-10-CM

## 2018-08-28 NOTE — Patient Instructions (Signed)
Can try lansoprazole 60 mg by mouth 30 minutes before evening meal for a week or so and see if it helps alleviate chest pain.

## 2018-08-28 NOTE — Telephone Encounter (Signed)
Form received, will review with provider for recommendations.

## 2018-08-28 NOTE — Progress Notes (Signed)
Presenting complaint;  Follow-up for GERD and chest pain.  Database and subjective:  Patient is 78 year old Caucasian female with chronic GERD as well as history of atypical chest pain and is here for scheduled visit.  She was last seen in April 2019.  She has been seen by her cardiologist and pain is felt to be noncardiac.  She is on lansoprazole 30 mg in the morning and she takes a second dose every now and then.  She has chest pain usually at night when she lies in bed.  Pain is mild and not associated with shortness of breath or diaphoresis.  She states this pain does not scare her because she knows it is not coming from her heart.  She has polyarthralgias and takes oxycodone and Xanax at bedtime so that she could rest.  She has HOB elevated by 4 inches.  She has difficulty swallowing large pills but she has no difficulty with food.  Similarly she denies cough hoarseness or sore throat.  She says she is doing much better as her bowel movements are concerned.  Diarrhea or urgency occurs infrequently.  She rarely has constipation.  She says she is on fruits and salads and eats very little meat. She lives alone.  She does not drive because of ocular myasthenia gravis. Her daughter [name] generally helps when she needs to go to the grocery store run errands. She has been under a lot of stress since I saw her last.  She lost her son and daughter and also her granddaughter.  Her daughter was an Therapist, sports at [employer]. Ms. Pablo Lawrence has increased her Wellbutrin and she believes she is doing much better. She has a history of paraganglioma for which she had abdominal surgery in October 2013.  She is being followed by Dr. Tressie Stalker who is now with [facility].  She says she is scheduled to undergo some studies at [facility]  Current Medications: Outpatient Encounter Medications as of 08/28/2018  Medication Sig  . ALPRAZolam (XANAX) 1 MG tablet Take 1 mg by mouth at bedtime.  Marland Kitchen atenolol (TENORMIN) 25 MG tablet Take  one tablet by mouth daily.  May take an additional tablet for systolic >962 or severe palpitations.  Marland Kitchen b complex vitamins tablet Take 1 tablet by mouth daily.  Marland Kitchen buPROPion (WELLBUTRIN XL) 150 MG 24 hr tablet Take 150 mg by mouth daily.  . cholecalciferol (VITAMIN D) 1000 units tablet Take 5,000 Units by mouth daily.   . Coenzyme Q10 (CO Q 10) 10 MG CAPS Take 10 mg by mouth every evening.   . Cyanocobalamin (B-12) 2500 MCG SUBL Place 1 tablet under the tongue every evening. Time release  . diclofenac sodium (VOLTAREN) 1 % GEL Apply 4 g topically 4 (four) times daily as needed (for pain).   Marland Kitchen ELIQUIS 5 MG TABS tablet TAKE (1) TABLET BY MOUTH TWICE DAILY.  Marland Kitchen lansoprazole (PREVACID) 30 MG capsule TAKE (1) CAPSULE BY MOUTH TWICE DAILY.  Marland Kitchen levothyroxine (SYNTHROID) 112 MCG tablet Take 112 mcg by mouth daily before breakfast.  . lidocaine (LIDODERM) 5 % Place 1-3 patches onto the skin daily as needed (for pain). Remove & Discard patch within 12 hours or as directed by MD for pain  . Magnesium 250 MG TABS Take 500 mg by mouth every evening.   Marland Kitchen OVER THE COUNTER MEDICATION Place 1 drop into both eyes 4 (four) times daily. Sooth XP Eye drops uses four times daily.   Marland Kitchen oxyCODONE-acetaminophen (PERCOCET) 10-325 MG tablet Take 1  tablet by mouth every 6 (six) hours as needed.   . pravastatin (PRAVACHOL) 80 MG tablet Take 80 mg by mouth every evening.   . pyridostigmine (MESTINON) 60 MG tablet Take 0.5 tablets (30 mg total) by mouth 4 (four) times daily.  . SUMAtriptan (IMITREX) 50 MG tablet Take 50 mg by mouth every 2 (two) hours as needed. For migraines  . Terbinafine 1 % GEL Apply topically 2 (two) times a day.    No facility-administered encounter medications on file as of 08/28/2018.      Objective: Blood pressure (!) 163/92, pulse 64, temperature 98.6 F (37 C), temperature source Oral, resp. rate 18, height 5\' 3"  (1.6 m), weight 194 lb (88 kg). Patient is alert and in no acute distress. Conjunctiva  is pink. Sclera is nonicteric Oropharyngeal mucosa is normal. No neck masses or thyromegaly noted. Cardiac exam with regular rhythm normal S1 and S2. No murmur or gallop noted. She has pacemaker in left pectoral region. Lungs are clear to auscultation. Abdomen she has midline scar.  Bowel sounds are normal.  On palpation abdomen is soft and nontender with organomegaly or masses. No LE edema or clubbing noted.  Assessment:  #1.  GERD.  Typical symptoms are well controlled with PPI therapy and dietary measures.  She is not having any side effects with PPI therapy.  #2.  Atypical chest pain.  Pain only occurs when she is not bad at night.  This could be atypical manifestation of GERD or may well be musculoskeletal pain.  I would like for her to try doubling lansoprazole dose before evening meal for a week or so and see if it makes a difference.  On another occasion I would like for her to sleep in a propped up position or in a recliner and see if she would be pain-free.  Plan:  Patient will try lansoprazole 60 mg by mouth 30 minutes before evening meal daily for a week to determine if chest pain can be prevented.  If this approach does not help but she will try sleeping in recliner or propped up position to see if she could be pain-free. Office visit in 1 year.

## 2018-08-29 NOTE — Telephone Encounter (Signed)
LMOVM for Roselyn Reef advising that form was received and that I will review with MD for recommendations next week.

## 2018-09-03 DIAGNOSIS — G7000 Myasthenia gravis without (acute) exacerbation: Secondary | ICD-10-CM | POA: Diagnosis not present

## 2018-09-03 DIAGNOSIS — Z79891 Long term (current) use of opiate analgesic: Secondary | ICD-10-CM | POA: Diagnosis not present

## 2018-09-03 DIAGNOSIS — I10 Essential (primary) hypertension: Secondary | ICD-10-CM | POA: Diagnosis not present

## 2018-09-03 DIAGNOSIS — N644 Mastodynia: Secondary | ICD-10-CM | POA: Diagnosis not present

## 2018-09-03 DIAGNOSIS — E782 Mixed hyperlipidemia: Secondary | ICD-10-CM | POA: Diagnosis not present

## 2018-09-03 DIAGNOSIS — Z634 Disappearance and death of family member: Secondary | ICD-10-CM | POA: Diagnosis not present

## 2018-09-03 DIAGNOSIS — F331 Major depressive disorder, recurrent, moderate: Secondary | ICD-10-CM | POA: Diagnosis not present

## 2018-09-03 DIAGNOSIS — G894 Chronic pain syndrome: Secondary | ICD-10-CM | POA: Diagnosis not present

## 2018-09-03 DIAGNOSIS — I482 Chronic atrial fibrillation, unspecified: Secondary | ICD-10-CM | POA: Diagnosis not present

## 2018-09-03 DIAGNOSIS — N183 Chronic kidney disease, stage 3 (moderate): Secondary | ICD-10-CM | POA: Diagnosis not present

## 2018-09-03 DIAGNOSIS — G4700 Insomnia, unspecified: Secondary | ICD-10-CM | POA: Diagnosis not present

## 2018-09-03 DIAGNOSIS — M25511 Pain in right shoulder: Secondary | ICD-10-CM | POA: Diagnosis not present

## 2018-09-03 DIAGNOSIS — I4891 Unspecified atrial fibrillation: Secondary | ICD-10-CM | POA: Diagnosis not present

## 2018-09-03 DIAGNOSIS — E039 Hypothyroidism, unspecified: Secondary | ICD-10-CM | POA: Diagnosis not present

## 2018-09-04 ENCOUNTER — Other Ambulatory Visit (INDEPENDENT_AMBULATORY_CARE_PROVIDER_SITE_OTHER): Payer: Self-pay | Admitting: Internal Medicine

## 2018-09-04 DIAGNOSIS — K219 Gastro-esophageal reflux disease without esophagitis: Secondary | ICD-10-CM

## 2018-09-04 NOTE — Telephone Encounter (Signed)
LMOVM for [facility] requesting call back to DC. Form has been completed, need their fax number to send it back.

## 2018-09-05 NOTE — Telephone Encounter (Signed)
LMOVM for [facility] requesting call back to DC.

## 2018-09-06 NOTE — Telephone Encounter (Signed)
LMOVM requesting call back with fax number.

## 2018-09-06 NOTE — Telephone Encounter (Signed)
Colletta Maryland with [facility] Radiology Scheduling returned call. Fax number: [phone]. Attempted to fax form, received error message that number is not in service.  Called back, updated fax number is [phone]. Re-faxed, confirmation received.

## 2018-09-21 ENCOUNTER — Emergency Department (HOSPITAL_COMMUNITY)
Admission: EM | Admit: 2018-09-21 | Discharge: 2018-09-21 | Disposition: A | Discharge status: Home / Self Care | Payer: Medicare Other | Attending: Emergency Medicine | Admitting: Emergency Medicine

## 2018-09-21 ENCOUNTER — Emergency Department (HOSPITAL_COMMUNITY): Payer: Medicare Other

## 2018-09-21 ENCOUNTER — Other Ambulatory Visit: Payer: Self-pay

## 2018-09-21 ENCOUNTER — Encounter (HOSPITAL_COMMUNITY): Payer: Self-pay | Admitting: Emergency Medicine

## 2018-09-21 DIAGNOSIS — E039 Hypothyroidism, unspecified: Secondary | ICD-10-CM | POA: Diagnosis not present

## 2018-09-21 DIAGNOSIS — I110 Hypertensive heart disease with heart failure: Secondary | ICD-10-CM | POA: Diagnosis not present

## 2018-09-21 DIAGNOSIS — R0789 Other chest pain: Secondary | ICD-10-CM | POA: Insufficient documentation

## 2018-09-21 DIAGNOSIS — R0902 Hypoxemia: Secondary | ICD-10-CM | POA: Diagnosis not present

## 2018-09-21 DIAGNOSIS — I5032 Chronic diastolic (congestive) heart failure: Secondary | ICD-10-CM | POA: Insufficient documentation

## 2018-09-21 DIAGNOSIS — I10 Essential (primary) hypertension: Secondary | ICD-10-CM | POA: Diagnosis not present

## 2018-09-21 DIAGNOSIS — Z79899 Other long term (current) drug therapy: Secondary | ICD-10-CM | POA: Diagnosis not present

## 2018-09-21 DIAGNOSIS — I2584 Coronary atherosclerosis due to calcified coronary lesion: Secondary | ICD-10-CM | POA: Diagnosis not present

## 2018-09-21 DIAGNOSIS — Z950 Presence of cardiac pacemaker: Secondary | ICD-10-CM | POA: Insufficient documentation

## 2018-09-21 DIAGNOSIS — R079 Chest pain, unspecified: Secondary | ICD-10-CM | POA: Diagnosis not present

## 2018-09-21 DIAGNOSIS — Z96653 Presence of artificial knee joint, bilateral: Secondary | ICD-10-CM | POA: Insufficient documentation

## 2018-09-21 DIAGNOSIS — R0689 Other abnormalities of breathing: Secondary | ICD-10-CM | POA: Diagnosis not present

## 2018-09-21 LAB — CBC WITH DIFFERENTIAL/PLATELET
Abs Immature Granulocytes: 0.05 10*3/uL (ref 0.00–0.07)
Basophils Absolute: 0.1 10*3/uL (ref 0.0–0.1)
Basophils Relative: 1 %
Eosinophils Absolute: 0.1 10*3/uL (ref 0.0–0.5)
Eosinophils Relative: 1 %
HCT: 40.8 % (ref 36.0–46.0)
Hemoglobin: 13.3 g/dL (ref 12.0–15.0)
Immature Granulocytes: 0 %
Lymphocytes Relative: 28 %
Lymphs Abs: 3.7 10*3/uL (ref 0.7–4.0)
MCH: 33.1 pg (ref 26.0–34.0)
MCHC: 32.6 g/dL (ref 30.0–36.0)
MCV: 101.5 fL — ABNORMAL HIGH (ref 80.0–100.0)
Monocytes Absolute: 1.2 10*3/uL — ABNORMAL HIGH (ref 0.1–1.0)
Monocytes Relative: 9 %
Neutro Abs: 8 10*3/uL — ABNORMAL HIGH (ref 1.7–7.7)
Neutrophils Relative %: 61 %
Platelets: 134 10*3/uL — ABNORMAL LOW (ref 150–400)
RBC: 4.02 MIL/uL (ref 3.87–5.11)
RDW: 12.6 % (ref 11.5–15.5)
WBC: 13.2 10*3/uL — ABNORMAL HIGH (ref 4.0–10.5)
nRBC: 0 % (ref 0.0–0.2)

## 2018-09-21 LAB — BASIC METABOLIC PANEL
Anion gap: 13 (ref 5–15)
BUN: 20 mg/dL (ref 8–23)
CO2: 25 mmol/L (ref 22–32)
Calcium: 9.5 mg/dL (ref 8.9–10.3)
Chloride: 100 mmol/L (ref 98–111)
Creatinine, Ser: 1.07 mg/dL — ABNORMAL HIGH (ref 0.44–1.00)
GFR calc Af Amer: 57 mL/min — ABNORMAL LOW (ref >60)
GFR calc non Af Amer: 49 mL/min — ABNORMAL LOW (ref >60)
Glucose, Bld: 137 mg/dL — ABNORMAL HIGH (ref 70–99)
Potassium: 4 mmol/L (ref 3.5–5.1)
Sodium: 138 mmol/L (ref 135–145)

## 2018-09-21 LAB — HEPATIC FUNCTION PANEL
ALT: 13 U/L (ref 0–44)
AST: 17 U/L (ref 15–41)
Albumin: 3.7 g/dL (ref 3.5–5.0)
Alkaline Phosphatase: 59 U/L (ref 38–126)
Bilirubin, Direct: 0.1 mg/dL (ref 0.0–0.2)
Indirect Bilirubin: 0.7 mg/dL (ref 0.3–0.9)
Total Bilirubin: 0.8 mg/dL (ref 0.3–1.2)
Total Protein: 6.6 g/dL (ref 6.5–8.1)

## 2018-09-21 LAB — URINALYSIS, ROUTINE W REFLEX MICROSCOPIC
Bilirubin Urine: NEGATIVE
Glucose, UA: NEGATIVE mg/dL
Ketones, ur: NEGATIVE mg/dL
Nitrite: POSITIVE — AB
Protein, ur: NEGATIVE mg/dL
Specific Gravity, Urine: 1.013 (ref 1.005–1.030)
pH: 6 (ref 5.0–8.0)

## 2018-09-21 LAB — TROPONIN I (HIGH SENSITIVITY)
Troponin I (High Sensitivity): 7 ng/L (ref <18)
Troponin I (High Sensitivity): 6 ng/L (ref <18)

## 2018-09-21 LAB — LIPASE, BLOOD: Lipase: 30 U/L (ref 11–51)

## 2018-09-21 MED ORDER — NITROGLYCERIN IN D5W 200-5 MCG/ML-% IV SOLN
5.0000 ug/min | INTRAVENOUS | Status: DC
  Administered 2018-09-21: 5 ug/min via INTRAVENOUS
  Filled 2018-09-21: qty 250

## 2018-09-21 MED ORDER — HYDROMORPHONE HCL 1 MG/ML IJ SOLN
1.0000 mg | INTRAMUSCULAR | Status: DC | PRN
  Administered 2018-09-21: 1 mg via INTRAVENOUS
  Filled 2018-09-21: qty 1

## 2018-09-21 MED ORDER — FENTANYL CITRATE (PF) 100 MCG/2ML IJ SOLN: 50.0000 ug | INTRAMUSCULAR | Status: DC | PRN

## 2018-09-21 MED ORDER — LIDOCAINE VISCOUS HCL 2 % MT SOLN
15.0000 mL | Freq: Once | OROMUCOSAL | Status: AC
  Administered 2018-09-21: 15 mL via ORAL
  Filled 2018-09-21: qty 15

## 2018-09-21 MED ORDER — SODIUM CHLORIDE 0.9 % IV SOLN
1.0000 g | Freq: Once | INTRAVENOUS | Status: AC
  Administered 2018-09-21: 1 g via INTRAVENOUS
  Filled 2018-09-21: qty 10

## 2018-09-21 MED ORDER — DIAZEPAM 2 MG PO TABS
2.0000 mg | ORAL_TABLET | Freq: Once | ORAL | Status: DC
  Filled 2018-09-21: qty 1

## 2018-09-21 MED ORDER — SODIUM CHLORIDE 0.9 % IV BOLUS
500.0000 mL | Freq: Once | INTRAVENOUS | Status: AC
  Administered 2018-09-21: 500 mL via INTRAVENOUS

## 2018-09-21 MED ORDER — NITROGLYCERIN 0.4 MG SL SUBL
0.4000 mg | SUBLINGUAL_TABLET | SUBLINGUAL | Status: DC | PRN
  Administered 2018-09-21: 0.4 mg via SUBLINGUAL
  Filled 2018-09-21: qty 1

## 2018-09-21 MED ORDER — IOHEXOL 350 MG/ML SOLN
100.0000 mL | Freq: Once | INTRAVENOUS | Status: AC | PRN
  Administered 2018-09-21: 100 mL via INTRAVENOUS

## 2018-09-21 MED ORDER — ALUM & MAG HYDROXIDE-SIMETH 200-200-20 MG/5ML PO SUSP
30.0000 mL | Freq: Once | ORAL | Status: AC
  Administered 2018-09-21: 30 mL via ORAL
  Filled 2018-09-21: qty 30

## 2018-09-21 MED ORDER — FAMOTIDINE IN NACL 20-0.9 MG/50ML-% IV SOLN
20.0000 mg | Freq: Once | INTRAVENOUS | Status: AC
  Administered 2018-09-21: 20 mg via INTRAVENOUS
  Filled 2018-09-21: qty 50

## 2018-09-21 MED ORDER — MORPHINE SULFATE (PF) 4 MG/ML IV SOLN
4.0000 mg | INTRAVENOUS | Status: AC | PRN
  Administered 2018-09-21 (×2): 4 mg via INTRAVENOUS
  Filled 2018-09-21 (×2): qty 1

## 2018-09-21 MED ORDER — FENTANYL CITRATE (PF) 100 MCG/2ML IJ SOLN
50.0000 ug | INTRAMUSCULAR | Status: DC | PRN
  Administered 2018-09-21: 50 ug via INTRAVENOUS
  Filled 2018-09-21: qty 2

## 2018-09-21 NOTE — ED Notes (Signed)
Patient assisted to recliner for comfort.

## 2018-09-21 NOTE — ED Notes (Addendum)
Pt informed that MD ordered a CT Angio and pt reported that she was told that she needed to have a pacemaker representative at the bedside prior to a CT scan. RN called radiology to notify them and they reported they have never had to have a representative at the bedside for CT. RN called [employer] and spoke with the local representative and she clarified that there was no need for them to be at the bedside for CT, only for MRI. Bambi, CT tech notified.

## 2018-09-21 NOTE — ED Provider Notes (Signed)
[facility] EMERGENCY DEPARTMENT Provider Note   CSN: [id number] Arrival date & time: 09/21/18  6659     History   Chief Complaint Chief Complaint  Patient presents with   Chest Pain    HPI [name] is a 78 y.o. female.     HPI  Pt was seen at [place]. Per pt, c/o gradual onset and persistence of constant mid-sternal chest "aching pain" that work her up from sleep approximately 0300 this morning PTA. Pt states the pain began in the right side of her jaw, radiated to her chest and mid-back. Pt states she walked to the bathroom without change in her symptoms. Pt states she laid back down again, without change in her symptoms. Pt states she took a xanax, oxycodone and placed a lidocaine patch topically without change in her symptoms. Pt states the pain worsens when she takes a deep breath. Endorses taking her increased PPI dose before bedtime, as per her GI MD instructions, for hx of GERD and atypical CP (thought to be msk).  EMS gave ASA and SL ntg en route without improvement. Denies palpitations, no SOB/cough, no abd pain, no N/V/D, no rash, no injury, no fevers.     Past Medical History:  Diagnosis Date   Anemia    Chronic back pain    Chronic nausea    Complication of anesthesia    Low blood pressure, heart rate, O2 sat   Depression with anxiety    Depression with anxiety    Diverticulosis    Dyspnea    Essential hypertension    GERD (gastroesophageal reflux disease)    History of pneumonia    Hypothyroidism    IBS (irritable colon syndrome)    LVH (left ventricular hypertrophy)    Migraines    Mixed hyperlipidemia    Obesity    Ocular myasthenia gravis (HCC)    Osteoarthritis of knee    PAF (paroxysmal atrial fibrillation) (HCC)    chads2vasc score is at least 4   Paraganglioma ([place])    Resection 02/2012 (retroperitoneal)   Paraganglioma, malignant ([place]) 11/06/2015   Presence of permanent cardiac pacemaker 07/18/2017   Renal insufficiency     Skin cancer     Patient Active Problem List   Diagnosis Date Noted   Sinus node dysfunction ([place]) 07/18/2017   Dyspnea on exertion 06/25/2017   Paraganglioma, malignant ([place]) 11/06/2015   Hypertensive cardiovascular disease 05/25/2014   Malnutrition of moderate degree ([place]) 04/28/2014   Thrombocytopenia ([place]) 04/28/2014   UTI (urinary tract infection) 04/28/2014   Physical deconditioning 04/27/2014   Ocular myasthenia ([place]) 04/27/2014   Depression with anxiety 04/27/2014   Chronic diastolic CHF (congestive heart failure) ([place]) 04/27/2014   Bradycardia    Orthostatic hypotension    AKI (acute kidney injury) ([place]) 04/05/2014   Palpitations 04/04/2014   Paroxysmal atrial fibrillation ([place]) 04/04/2014   Chest pain 04/01/2014   Sinus bradycardia 04/01/2014   Weight loss 06/10/2012   Abdominal pain, bilateral lower quadrant 09/26/2011   Hypertension 07/26/2011   Hypothyroidism 07/26/2011   Arthritis 07/26/2011   High cholesterol 07/26/2011   GERD (gastroesophageal reflux disease) 07/26/2011   Rectal bleeding 07/26/2011   IBS (irritable bowel syndrome) 07/26/2011    Past Surgical History:  Procedure Laterality Date   ABDOMINAL HYSTERECTOMY     ABDOMINAL HYSTERECTOMY     CARDIAC CATHETERIZATION     CHOLECYSTECTOMY     COLONOSCOPY  08/24/2011   Procedure: COLONOSCOPY;  Surgeon: Rogene Houston, MD;  Location: [facility]  ENDO SUITE;  Service: Endoscopy;  Laterality: N/A;  1200   CT guided biopsy  01/16/12   GIVENS CAPSULE STUDY  01/29/2012   Procedure: GIVENS CAPSULE STUDY;  Surgeon: Rogene Houston, MD;  Location: [facility] ENDO SUITE;  Service: Endoscopy;  Laterality: N/A;  [place]     LAPAROTOMY  03/01/2012   Procedure: EXPLORATORY LAPAROTOMY;  Surgeon: Earnstine Regal, MD;  Location: [facility] ORS;  Service: General;  Laterality: N/A;  Exploratory Laparotomy ,Resection Retroperitoneal Mass   NASAL SINUS SURGERY     30 yrs ago   PACEMAKER IMPLANT  N/A 07/18/2017   Procedure: PACEMAKER IMPLANT;  Surgeon: Evans Lance, MD;  Location: [facility];  Service: Cardiovascular;  Laterality: N/A;   TOTAL KNEE ARTHROPLASTY Bilateral    2005 and 2011     OB History   No obstetric history on file.      Home Medications    Prior to Admission medications   Medication Sig Start Date End Date Taking? Authorizing Provider  ALPRAZolam Duanne Moron) 1 MG tablet Take 1 mg by mouth 2 (two) times daily as needed.     [provider]  atenolol (TENORMIN) 25 MG tablet Take one tablet by mouth daily.  May take an additional tablet for systolic >196 or severe palpitations. 02/18/18   Evans Lance, MD  b complex vitamins tablet Take 1 tablet by mouth daily.    [provider]  buPROPion (WELLBUTRIN XL) 150 MG 24 hr tablet Take 150 mg by mouth daily.    [provider]  cholecalciferol (VITAMIN D) 1000 units tablet Take 5,000 Units by mouth daily.     [provider]  Coenzyme Q10 (CO Q 10) 10 MG CAPS Take 10 mg by mouth every evening.     [provider]  Cyanocobalamin (B-12) 2500 MCG SUBL Place 1 tablet under the tongue every evening. Time release    [provider]  diclofenac sodium (VOLTAREN) 1 % GEL Apply 4 g topically 4 (four) times daily as needed (for pain).  01/13/15   [provider]  ELIQUIS 5 MG TABS tablet TAKE (1) TABLET BY MOUTH TWICE DAILY. 08/13/18   Evans Lance, MD  fluticasone (CUTIVATE) 0.05 % cream Apply 1 application topically daily as needed. 06/17/18   [provider]  ketoconazole (NIZORAL) 2 % cream Apply 1 application topically daily as needed. 06/17/18   [provider]  lansoprazole (PREVACID) 30 MG capsule TAKE (1) CAPSULE BY MOUTH TWICE DAILY. 09/05/18   Rogene Houston, MD  levothyroxine (SYNTHROID) 112 MCG tablet Take 112 mcg by mouth daily before breakfast.    [provider]  lidocaine (LIDODERM) 5 % Place 1-3 patches onto the  skin daily as needed (for pain). Remove & Discard patch within 12 hours or as directed by MD for pain    [provider]  Magnesium 250 MG TABS Take 500 mg by mouth every evening.     [provider]  OVER THE COUNTER MEDICATION Place 1 drop into both eyes 4 (four) times daily. Sooth XP Eye drops uses four times daily.     [provider]  oxyCODONE-acetaminophen (PERCOCET) 10-325 MG tablet Take 1 tablet by mouth every 6 (six) hours as needed.  05/14/18   [provider]  pravastatin (PRAVACHOL) 80 MG tablet Take 80 mg by mouth every evening.     [provider]  pyridostigmine (MESTINON) 60 MG tablet Take 0.5 tablets (  30 mg total) by mouth 4 (four) times daily. 11/13/17   Kathrynn Ducking, MD  SUMAtriptan (IMITREX) 50 MG tablet Take 50 mg by mouth every 2 (two) hours as needed. For migraines    [provider]  Terbinafine 1 % GEL Apply topically 2 (two) times a day.     [provider]    Family History Family History  Problem Relation Age of Onset   Inflammatory bowel disease Maternal Grandmother     Social History Social History   Tobacco Use   Smoking status: Never Smoker   Smokeless tobacco: Never Used  Substance Use Topics   Alcohol use: No    Alcohol/week: 0.0 standard drinks   Drug use: No     Allergies   Nitrofurantoin macrocrystal, Ampicillin, Avocado, Biaxin [clarithromycin], Doxycycline monohydrate, Moxifloxacin, Petrolatum-zinc oxide, Tetracyclines & related, and Codeine   Review of Systems Review of Systems ROS: Statement: All systems negative except as marked or noted in the HPI; Constitutional: Negative for fever and chills. ; ; Eyes: Negative for eye pain, redness and discharge. ; ; ENMT: Negative for ear pain, hoarseness, nasal congestion, sinus pressure and sore throat. ; ; Cardiovascular: +CP. Negative for palpitations, diaphoresis, dyspnea and peripheral edema. ; ; Respiratory: Negative for  cough, wheezing and stridor. ; ; Gastrointestinal: Negative for nausea, vomiting, diarrhea, abdominal pain, blood in stool, hematemesis, jaundice and rectal bleeding. . ; ; Genitourinary: Negative for dysuria, flank pain and hematuria. ; ; Musculoskeletal: Negative for back pain and neck pain. Negative for swelling and trauma.; ; Skin: Negative for pruritus, rash, abrasions, blisters, bruising and skin lesion.; ; Neuro: Negative for headache, lightheadedness and neck stiffness. Negative for weakness, altered level of consciousness, altered mental status, extremity weakness, paresthesias, involuntary movement, seizure and syncope.       Physical Exam Updated Vital Signs BP 126/68    Pulse 63    Temp 98.2 F (36.8 C) (Oral)    Resp (!) 22    Ht 5\' 4"  (1.626 m)    Wt 88 kg    SpO2 97%    BMI 33.30 kg/m    Patient Vitals for the past 24 hrs:  BP Temp Temp src Pulse Resp SpO2 Height Weight  09/21/18 1230 108/74 -- -- (!) 59 17 97 % -- --  09/21/18 1213 (!) 131/58 -- -- 63 16 98 % -- --  09/21/18 1200 (!) 131/58 -- -- 61 (!) 22 96 % -- --  09/21/18 1130 (!) 147/64 -- -- 60 (!) 25 94 % -- --  09/21/18 1114 (!) 145/65 -- -- 64 16 100 % -- --  09/21/18 1100 (!) 150/66 -- -- -- (!) 28 -- -- --  09/21/18 1030 109/78 -- -- 62 16 97 % -- --  09/21/18 1025 (!) 140/59 -- -- -- 17 -- -- --  09/21/18 1017 139/74 -- -- (!) 54 (!) 24 96 % -- --  09/21/18 0930 (!) 127/57 -- -- (!) 59 18 100 % -- --  09/21/18 0928 (!) 120/50 -- -- (!) 58 17 98 % -- --  09/21/18 0900 107/61 -- -- (!) 59 16 93 % -- --  09/21/18 0849 95/81 -- -- (!) 59 14 96 % -- --  09/21/18 0837 (!) 142/55 -- -- 65 19 100 % -- --  09/21/18 0800 126/68 -- -- 63 (!) 22 97 % -- --  09/21/18 0730 102/79 -- -- (!) 57 14 92 % -- --  09/21/18 0704 -- -- -- -- -- --  5\' 4"  (1.626 m) 88 kg  09/21/18 0702 131/81 98.2 F (36.8 C) Oral 60 20 98 % -- --  09/21/18 0700 131/81 -- -- -- 17 -- -- --     Physical Exam 0720: Physical examination:   Nursing notes reviewed; Vital signs and O2 SAT reviewed;  Constitutional: Well developed, Well nourished, Well hydrated, In no acute distress; Head:  Normocephalic, atraumatic; Eyes: EOMI, PERRL, No scleral icterus; ENMT: Mouth and pharynx normal, Mucous membranes moist; Neck: Supple, Full range of motion, No lymphadenopathy; Cardiovascular: Regular rate and rhythm, No gallop; Respiratory: Breath sounds clear & equal bilaterally, No wheezes.  Speaking full sentences with ease, Normal respiratory effort/excursion; Chest: Nontender, Movement normal; Abdomen: Soft, Nontender, Nondistended, Normal bowel sounds; Genitourinary: No CVA tenderness; Extremities: Peripheral pulses normal, No tenderness, No edema, No calf edema or asymmetry.; Neuro: AA&Ox3, Major CN grossly intact.  Speech clear. No gross focal motor or sensory deficits in extremities.; Skin: Color normal, Warm, Dry.; Psych:  Very anxious.     ED Treatments / Results  Labs (all labs ordered are listed, but only abnormal results are displayed)   EKG EKG Interpretation  Date/Time:  Saturday September 21 2018 06:59:12 EDT Ventricular Rate:  61 PR Interval:    QRS Duration: 116 QT Interval:  437 QTC Calculation: 441 R Axis:   -64 Text Interpretation:  Atrial-paced rhythm Incomplete RBBB and LAFB Probable left ventricular hypertrophy Artifact When compared with ECG of 07/19/2017 Artifact is now Present Otherwise no significant change Confirmed by Francine Graven [phone]) on 09/21/2018 8:20:53 AM   Radiology   Procedures Procedures (including critical care time)  Medications Ordered in ED Medications  nitroGLYCERIN (NITROSTAT) SL tablet 0.4 mg (has no administration in time range)  morphine 4 MG/ML injection 4 mg (4 mg Intravenous Given 09/21/18 0819)  sodium chloride 0.9 % bolus 500 mL (500 mLs Intravenous New Bag/Given 09/21/18 0801)  alum & mag hydroxide-simeth (MAALOX/MYLANTA) 200-200-20 MG/5ML suspension 30 mL (30 mLs Oral Given 09/21/18  0757)    And  lidocaine (XYLOCAINE) 2 % viscous mouth solution 15 mL (15 mLs Oral Given 09/21/18 0757)     Initial Impression / Assessment and Plan / ED Course  I have reviewed the triage vital signs and the nursing notes.  Pertinent labs & imaging results that were available during my care of the patient were reviewed by me and considered in my medical decision making (see chart for details).     MDM Reviewed: previous chart, nursing note and vitals Reviewed previous: labs and ECG Interpretation: labs, ECG and x-ray    Results for orders placed or performed during the hospital encounter of 19/14/78  Basic metabolic panel  Result Value Ref Range   Sodium 138 135 - 145 mmol/L   Potassium 4.0 3.5 - 5.1 mmol/L   Chloride 100 98 - 111 mmol/L   CO2 25 22 - 32 mmol/L   Glucose, Bld 137 (H) 70 - 99 mg/dL   BUN 20 8 - 23 mg/dL   Creatinine, Ser 1.07 (H) 0.44 - 1.00 mg/dL   Calcium 9.5 8.9 - 10.3 mg/dL   GFR calc non Af Amer 49 (L) >60 mL/min   GFR calc Af Amer 57 (L) >60 mL/min   Anion gap 13 5 - 15  Troponin I (High Sensitivity)  Result Value Ref Range   Troponin I (High Sensitivity) 7.00 <18 ng/L  Troponin I (High Sensitivity)  Result Value Ref Range   Troponin I (High Sensitivity) 6.00 <18 ng/L  CBC  with Differential  Result Value Ref Range   WBC 13.2 (H) 4.0 - 10.5 K/uL   RBC 4.02 3.87 - 5.11 MIL/uL   Hemoglobin 13.3 12.0 - 15.0 g/dL   HCT 40.8 36.0 - 46.0 %   MCV 101.5 (H) 80.0 - 100.0 fL   MCH 33.1 26.0 - 34.0 pg   MCHC 32.6 30.0 - 36.0 g/dL   RDW 12.6 11.5 - 15.5 %   Platelets 134 (L) 150 - 400 K/uL   nRBC 0.0 0.0 - 0.2 %   Neutrophils Relative % 61 %   Neutro Abs 8.0 (H) 1.7 - 7.7 K/uL   Lymphocytes Relative 28 %   Lymphs Abs 3.7 0.7 - 4.0 K/uL   Monocytes Relative 9 %   Monocytes Absolute 1.2 (H) 0.1 - 1.0 K/uL   Eosinophils Relative 1 %   Eosinophils Absolute 0.1 0.0 - 0.5 K/uL   Basophils Relative 1 %   Basophils Absolute 0.1 0.0 - 0.1 K/uL   Immature  Granulocytes 0 %   Abs Immature Granulocytes 0.05 0.00 - 0.07 K/uL  Urinalysis, Routine w reflex microscopic  Result Value Ref Range   Color, Urine YELLOW YELLOW   APPearance HAZY (A) CLEAR   Specific Gravity, Urine 1.013 1.005 - 1.030   pH 6.0 5.0 - 8.0   Glucose, UA NEGATIVE NEGATIVE mg/dL   Hgb urine dipstick SMALL (A) NEGATIVE   Bilirubin Urine NEGATIVE NEGATIVE   Ketones, ur NEGATIVE NEGATIVE mg/dL   Protein, ur NEGATIVE NEGATIVE mg/dL   Nitrite POSITIVE (A) NEGATIVE   Leukocytes,Ua MODERATE (A) NEGATIVE   RBC / HPF 0-5 0 - 5 RBC/hpf   WBC, UA 21-50 0 - 5 WBC/hpf   Bacteria, UA MANY (A) NONE SEEN   Squamous Epithelial / LPF 0-5 0 - 5   Mucus PRESENT   Hepatic function panel  Result Value Ref Range   Total Protein 6.6 6.5 - 8.1 g/dL   Albumin 3.7 3.5 - 5.0 g/dL   AST 17 15 - 41 U/L   ALT 13 0 - 44 U/L   Alkaline Phosphatase 59 38 - 126 U/L   Total Bilirubin 0.8 0.3 - 1.2 mg/dL   Bilirubin, Direct 0.1 0.0 - 0.2 mg/dL   Indirect Bilirubin 0.7 0.3 - 0.9 mg/dL  Lipase, blood  Result Value Ref Range   Lipase 30 11 - 51 U/L   Dg Chest 2 View Result Date: 09/21/2018 CLINICAL DATA:  Chest pain EXAM: CHEST - 2 VIEW COMPARISON:  07/19/2017 FINDINGS: Mild cardiomegaly without edema or CHF. Similar chronic bronchitic change and bibasilar atelectasis versus scarring. No definite focal pneumonia, collapse or consolidation. Negative for significant effusion or pneumothorax. Trachea midline. Left subclavian pacer noted. Degenerative changes of the spine and shoulders. Remote cholecystectomy. IMPRESSION: Stable cardiomegaly with chronic bronchitic change and basilar scarring as before. No interval change or superimposed acute process. Electronically Signed   By: Jerilynn Mages.  Shick M.D.   On: 09/21/2018 08:23    Ct Angio Chest/abd/pel For Dissection W And/or W/wo Result Date: 09/21/2018 CLINICAL DATA:  78 year old female with chest pain EXAM: CT ANGIOGRAPHY CHEST, ABDOMEN AND PELVIS TECHNIQUE:  Multidetector CT imaging through the chest, abdomen and pelvis was performed using the standard protocol during bolus administration of intravenous contrast. Multiplanar reconstructed images and MIPs were obtained and reviewed to evaluate the vascular anatomy. CONTRAST:  159mL OMNIPAQUE IOHEXOL 350 MG/ML SOLN COMPARISON:  Prior CT scan of the chest, abdomen and pelvis 06/15/2016 FINDINGS: CTA CHEST FINDINGS Cardiovascular: On the initial  noncontrast enhanced images, there is no evidence of acute intramural hematoma. Following administration of intravenous contrast, there is excellent opacification of the arterial structures. Conventional 3 vessel arch anatomy. No evidence of aneurysm or dissection. No significant atherosclerotic plaque. The main pulmonary artery is normal in caliber. There is no evidence of acute pulmonary embolus. A left subclavian approach cardiac rhythm maintenance device is present with leads terminating in the right atrium and right ventricle. The heart is normal in size. No pericardial effusion. Mediastinum/Nodes: Unremarkable esophagus. No mediastinal mass or adenopathy. Goitrous thyroid gland with dystrophic calcifications in the left mid gland. Lungs/Pleura: Chronic elevation of the central right hemidiaphragm with associated subsegmental atelectasis. No focal airspace consolidation, pulmonary edema, pleural effusion or pneumothorax. No suspicious pulmonary mass or nodule. Trace dependent atelectasis. Musculoskeletal: No acute fracture or aggressive appearing lytic or blastic osseous lesion. Review of the MIP images confirms the above findings. CTA ABDOMEN AND PELVIS FINDINGS VASCULAR Aorta: No evidence of aneurysm or dissection. Trace calcified atherosclerotic plaque. Celiac: Patent without evidence of aneurysm, dissection, vasculitis or significant stenosis. SMA: Patent without evidence of aneurysm, dissection, vasculitis or significant stenosis. Renals: Both renal arteries are patent  without evidence of aneurysm, dissection, vasculitis, fibromuscular dysplasia or significant stenosis. IMA: Patent without evidence of aneurysm, dissection, vasculitis or significant stenosis. Inflow: No evidence of aneurysm or dissection. Minimal atherosclerotic plaque. Faint beaded appearance of the bilateral proximal external iliac artery suggests underlying fibromuscular dysplasia. There is not appear to be a significant stenosis or vascular web. Veins: No obvious venous abnormality within the limitations of this arterial phase study. Review of the MIP images confirms the above findings. NON-VASCULAR Hepatobiliary: Normal hepatic contour and morphology. No discrete hepatic lesion. The gallbladder is surgically absent. There is dilation of the central hepatic ducts and common bile duct which has progressed compared to the most recent prior imaging from March of 2018. The common bile duct measures up to 1.7 cm in the porta hepatis compared to 1.2 cm previously. At the pancreatic head, the duct measures 11 mm compared to 7 mm. Node definite distal obstruction identified. Pancreas: Unremarkable. No pancreatic ductal dilatation or surrounding inflammatory changes. Spleen: Normal in size without focal abnormality. Adrenals/Urinary Tract: Normal adrenal glands. No evidence of enhancing renal mass, hydronephrosis or nephrolithiasis. Several tiny subcentimeter low-attenuation lesions are present bilaterally which are too small to characterize but statistically highly likely benign. The ureters and bladder are unremarkable. Stomach/Bowel: Colonic diverticular disease without CT evidence of active inflammation. No evidence of obstruction or focal bowel wall thickening. Normal appendix in the right lower quadrant. The terminal ileum is unremarkable. Lymphatic: No suspicious lymphadenopathy. Reproductive: The uterus is surgically absent. No adnexal mass identified. Other: Pelvic floor laxity. No evidence of ascites. No  abdominal wall hernia. Musculoskeletal: No acute fracture or aggressive appearing lytic or blastic osseous lesion. Advanced multilevel degenerative disc disease with likely degenerative levoconvex scoliosis of the lumbar spine. Review of the MIP images confirms the above findings. IMPRESSION: CTA CHEST 1. No evidence of acute aortic dissection, pulmonary embolus or other acute cardiopulmonary abnormality. 2. Left subclavian approach cardiac rhythm maintenance device in unchanged position. CTA ABD/PELVIS 1. No evidence of aneurysm, dissection or acute vascular abnormality. 2. Progressive dilation of the common bile duct compared to March of 2018 without convincing evidence of a distal obstruction or mass. Recommend correlation with serum bilirubin and LFTs. If there is laboratory evidence of biliary obstruction, further evaluation with MRCP may be warranted. 3. Trace aortic atherosclerotic plaque. Aortic Atherosclerosis (ICD10-170.0)  4. Mild changes of fibromuscular dysplasia in the bilateral external iliac arteries with out findings to suggest hemodynamically significant stenosis. 5. Colonic diverticular disease without CT evidence of active inflammation. 6. Advanced multilevel degenerative disc disease and degenerative levoconvex scoliosis of the lumbar spine. Electronically Signed   By: Jacqulynn Cadet M.D.   On: 09/21/2018 10:37     [name] was evaluated in Emergency Department on 09/21/2018 for the symptoms described in the history of present illness. She was evaluated in the context of the global COVID-19 pandemic, which necessitated consideration that the patient might be at risk for infection with the SARS-CoV-2 virus that causes COVID-19. Institutional protocols and algorithms that pertain to the evaluation of patients at risk for COVID-19 are in a state of rapid change based on information released by regulatory bodies including the CDC and federal and state organizations. These policies and  algorithms were followed during the patient's care in the ED.    1230:  Multiple doses of IV pain meds given. IV and PO GERD meds given. IV ntg also started, without much effect. Given uncontrolled pain, CTA obtained and is as above. LFT's and lipase added to labs. 2nd troponin negative; doubt ACS as cause for symptoms.   T/C returned from Cards Dr. Stanford Breed, case discussed, including:  HPI, pertinent PM/SHx, VS/PE, dx testing, ED course and treatment:  Agrees that EKG is unchanged from previous and troponin x2 negative rules out ACS as cause for symptoms, no further cards testing needed at this time. Dx and testing, as well as d/w Cards MD, d/w pt.  Questions answered.  Verb understanding.   1435:  Ntg discontinued. Pt given pain meds and requested to take her own xanax. Pt's pain improved. States she wants to go home now. Doubt PE as cause for symptoms with negative CTA chest for PE, low risk [place] and pt already taking Eliquis.  Doubt ACS as cause for symptoms with normal troponin x2 and unchanged EKG from previous after 8+ hours of constant atypical symptoms. LFT's normal and pt is without RUQ pain: pt can f/u with PMD or GI MD regarding biliary ductal dilatation seen on CT scan. Pt has tol PO well while in the ED without N/V, no stooling while in the ED, abd remains benign. VS remain stable. IV rocephin given for UTI on Udip; rx bactrim while UC pending (called into pt's pharmacy d/t computer issues).  Dx and testing, as well as incidental finding(s), d/w pt.  Questions answered.  Verb understanding, agreeable to d/c home with outpt f/u.      Final Clinical Impressions(s) / ED Diagnoses   Final diagnoses:  None    ED Discharge Orders    None       Francine Graven, DO 09/23/18 [phone]

## 2018-09-21 NOTE — ED Notes (Signed)
Pt took her home medication of Xanax 1mg  per EDP's order upon pt's request.

## 2018-09-21 NOTE — ED Notes (Signed)
Patient assisted back to the stretcher from recliner. Patient states she is till in pain. RN notified.

## 2018-09-21 NOTE — ED Notes (Signed)
EKG done and given to EDP. Patient placed in gown and on 12 lead at this time

## 2018-09-21 NOTE — Discharge Instructions (Addendum)
Your CT scan showed an incidental finding(s):  "Progressive dilation of the common bile duct compared to March of 2018 without convincing evidence of a distal obstruction or mass. Recommend correlation with serum bilirubin and LFTs. If there is laboratory evidence of biliary obstruction, further evaluation with MRCP may be warranted." Your LFT's were normal today. You will need to call your GI doctor on Monday to schedule a follow up appointment for these findings. Take your usual prescriptions as previously directed.  Apply moist heat or ice to the area(s) of discomfort, for 15 minutes at a time, several times per day for the next few days.  Do not fall asleep on a heating or ice pack.  Call your regular medical doctor on Monday to schedule a follow up appointment in the next 3 days.  Return to the Emergency Department immediately if worsening.

## 2018-09-21 NOTE — ED Triage Notes (Signed)
Patient came in by EMS. Chief complaint chest pain that started around 3 am this morning. Patient states pain in her jaw radiating into her chest and back. EMS gave 100 ml's of normal saline, 324 of ASA, and 1 sublingual nitro. Patient applied a lidocaine patch this am, took xanax, and oxycodone at home. Patient states it hurts when she takes a breath. Patient does have a pacemaker.

## 2018-09-21 NOTE — ED Notes (Signed)
Pt notified of need for urine sample. Pt reports she is unable to give sample at this time.

## 2018-09-23 ENCOUNTER — Other Ambulatory Visit: Payer: Self-pay

## 2018-09-23 ENCOUNTER — Encounter (INDEPENDENT_AMBULATORY_CARE_PROVIDER_SITE_OTHER): Payer: Self-pay | Admitting: Internal Medicine

## 2018-09-23 ENCOUNTER — Ambulatory Visit (INDEPENDENT_AMBULATORY_CARE_PROVIDER_SITE_OTHER): Payer: Medicare Other | Admitting: Internal Medicine

## 2018-09-23 ENCOUNTER — Encounter (INDEPENDENT_AMBULATORY_CARE_PROVIDER_SITE_OTHER): Payer: Self-pay | Admitting: *Deleted

## 2018-09-23 VITALS — BP 100/72 | Temp 98.3°F | Ht 63.0 in | Wt 193.4 lb

## 2018-09-23 DIAGNOSIS — K219 Gastro-esophageal reflux disease without esophagitis: Secondary | ICD-10-CM

## 2018-09-23 DIAGNOSIS — K838 Other specified diseases of biliary tract: Secondary | ICD-10-CM

## 2018-09-23 DIAGNOSIS — R0789 Other chest pain: Secondary | ICD-10-CM | POA: Diagnosis not present

## 2018-09-23 NOTE — Patient Instructions (Signed)
Physician will call with results of blood work and ultrasound when completed. 

## 2018-09-23 NOTE — Progress Notes (Signed)
Presenting complaint;  Recent episode of chest pain.  Database and subjective:  Patient is 78 year old Caucasian female who has multiple medical problems including chronic GERD and IBS who was last seen in the office on 08/28/2018.  She gave history of chest pain which almost always occurred at night.  I recommended she could take both doses of lansoprazole before evening meal just in case if this is atypical manifestation of GERD.  Patient's daughter [name] called earlier today and requested for her to be seen. Patient states she woke up around 3:30 AM on July 4 with pain on the right side of her neck as well as retrosternal stabbing pain radiating posteriorly.  She also noted radiation of this pain into right side of her jaw.  She says she never had pain like this before.  Pain was intense.  Pain would get worse on deep breathing.  She did not experience diaphoreses or lightheadedness.  Patient was concerned that she was having an MI.  She therefore called 911 and was brought to emergency room by EMS.  She stayed in emergency room for several hours.  High-sensitivity troponin levels were normal x2.  Her LFTs were normal.  She had CT angios chest which did not reveal any abnormality to account for her pain.  This study did reveal dilated bile duct measuring 17 mm.  Bile duct measured 12 mm on a CT of March 2018. Patient states pain continued until this morning.  She would notice retrosternal pain when she would take a deep breath.  She is pain-free at the present time.  She says abdominal pain that she used to have at night has improved since she is doubled up on lansoprazole.  She also reports sharp pain only if she pushes right costal margin.  She denies cough fever chills melena or rectal bleeding.  She has not lost any weight in the last 4 weeks.  Current Medications: Outpatient Encounter Medications as of 09/23/2018  Medication Sig  . ALPRAZolam (XANAX) 1 MG tablet Take 1 mg by mouth 2 (two) times  daily as needed.   Marland Kitchen atenolol (TENORMIN) 25 MG tablet Take one tablet by mouth daily.  May take an additional tablet for systolic >324 or severe palpitations.  Marland Kitchen b complex vitamins tablet Take 1 tablet by mouth daily.  Marland Kitchen buPROPion (WELLBUTRIN XL) 150 MG 24 hr tablet Take 150 mg by mouth daily.  . cholecalciferol (VITAMIN D) 1000 units tablet Take 5,000 Units by mouth 2 (two) times a day.   . Coenzyme Q10 (CO Q 10) 10 MG CAPS Take 10 mg by mouth every evening.   . Cyanocobalamin (B-12) 2500 MCG SUBL Place 1 tablet under the tongue 2 (two) times a day. Time release  . diclofenac sodium (VOLTAREN) 1 % GEL Apply 4 g topically 4 (four) times daily as needed (for pain).   Marland Kitchen ELIQUIS 5 MG TABS tablet TAKE (1) TABLET BY MOUTH TWICE DAILY.  . fluticasone (CUTIVATE) 0.05 % cream Apply 1 application topically daily as needed.  Marland Kitchen ketoconazole (NIZORAL) 2 % cream Apply 1 application topically daily as needed.  . lansoprazole (PREVACID) 30 MG capsule TAKE (1) CAPSULE BY MOUTH TWICE DAILY. (Patient taking differently: Take 60 mg by mouth daily. )  . levothyroxine (SYNTHROID) 112 MCG tablet Take 112 mcg by mouth daily before breakfast.  . lidocaine (LIDODERM) 5 % Place 1-3 patches onto the skin daily as needed (for pain). Remove & Discard patch within 12 hours or as directed by MD  for pain  . Magnesium 250 MG TABS Take 500 mg by mouth every evening.   Marland Kitchen OVER THE COUNTER MEDICATION Place 1 drop into both eyes 4 (four) times daily. Sooth XP Eye drops uses four times daily.   Marland Kitchen oxyCODONE-acetaminophen (PERCOCET) 10-325 MG tablet Take 1 tablet by mouth every 6 (six) hours as needed.   . pravastatin (PRAVACHOL) 80 MG tablet Take 80 mg by mouth every evening.   . pyridostigmine (MESTINON) 60 MG tablet Take 0.5 tablets (30 mg total) by mouth 4 (four) times daily.  Marland Kitchen sulfamethoxazole-trimethoprim (BACTRIM) 400-80 MG tablet Take 1 tablet by mouth 2 (two) times daily.  . SUMAtriptan (IMITREX) 50 MG tablet Take 50 mg by  mouth every 2 (two) hours as needed. For migraines  . Terbinafine 1 % GEL Apply topically 2 (two) times a day.    No facility-administered encounter medications on file as of 09/23/2018.      Objective: Blood pressure (!) 82/78, temperature 98.3 F (36.8 C), height _0  (1.6 m), weight 193 lb 6.4 oz (87.7 kg).  Supine blood pressure was 142/80 and sitting systolic blood pressure was 104.  Patient is alert and in no acute distress. Conjunctiva is pink. Sclera is nonicteric Oropharyngeal mucosa is normal. No neck masses or thyromegaly noted. Cardiac exam with regular rhythm normal S1 and S2. No murmur or gallop noted. Lungs are clear to auscultation. Abdomen is symmetrical.  She has midline scar.  Bowel sounds are normal.  No bruit noted.  On palpation abdomen is soft and nontender with organomegaly or masses. No LE edema or clubbing noted.  Labs/studies Results:  CBC Latest Ref Rng & Units 09/21/2018 09/19/2017 07/13/2017  WBC 4.0 - 10.5 K/uL 13.2(H) 8.6 10.5  Hemoglobin 12.0 - 15.0 g/dL 13.3 14.1 12.6  Hematocrit 36.0 - 46.0 % 40.8 43.7 38.3  Platelets 150 - 400 K/uL 134(L) 170 157    CMP Latest Ref Rng & Units 09/21/2018 09/19/2017 07/13/2017  Glucose 70 - 99 mg/dL 137(H) 94 115(H)  BUN 8 - 23 mg/dL 20 23 21(H)  Creatinine 0.44 - 1.00 mg/dL 1.07(H) 1.13(H) 0.88  Sodium 135 - 145 mmol/L 138 140 138  Potassium 3.5 - 5.1 mmol/L 4.0 4.6 4.3  Chloride 98 - 111 mmol/L 100 102 107  CO2 22 - 32 mmol/L _1 Calcium 8.9 - 10.3 mg/dL 9.5 9.9 9.5  Total Protein 6.5 - 8.1 g/dL 6.6 - -  Total Bilirubin 0.3 - 1.2 mg/dL 0.8 - -  Alkaline Phos 38 - 126 U/L 59 - -  AST 15 - 41 U/L 17 - -  ALT 0 - 44 U/L 13 - -    Hepatic Function Latest Ref Rng & Units 09/21/2018 06/25/2017 06/02/2016  Total Protein 6.5 - 8.1 g/dL 6.6 7.4 7.1  Albumin 3.5 - 5.0 g/dL 3.7 3.8 3.8  AST 15 - 41 U/L _2 ALT 0 - 44 U/L 13 13(L) 11(L)  Alk Phosphatase 38 - 126 U/L 59 56 50  Total Bilirubin 0.3 - 1.2 mg/dL 0.8  1.0 0.9  Bilirubin, Direct 0.0 - 0.2 mg/dL 0.1 - -   Serum lipase was 30.   CTA chest from 09/21/2018 and abdominal pelvic CT from March 2018 reviewed.   Assessment:  #1.  Recent bout of chest pain necessitating evaluation emergency room appears to be pleuritic chest pain which has resolved.  She could also be having esophageal spasm which would be difficult to evaluate other than with esophageal manometry.  If she was having solid food dysphagia one could argue that she is having esophageal spasms.  She was noted to have dilated bile duct on CT.  Bile duct diameter is increased from 12 mm in March, 2018 to 17 mm now.  Therefore she could have biliary colic presenting as chest pain.  Since her transaminases are normal it is less likely.  I would like for her to follow-up with Dr. Nevada Crane to get his input as well.  #2.  Dilated bile duct.  Bile duct diameter has increased from 12 mm in March, 2018 to 17 mm now.  Transaminases in the emergency room were normal.  These need to be repeated along with ultrasound.  However if transaminases are elevated would consider MRCP instead of ultrasound.  #3.  Chronic GERD.  Typical symptoms are well controlled with PPI.  Plan:  Patient will go to the lab for LFTs. Schedule right upper quadrant abdominal ultrasound. Patient will continue lansoprazole at a dose of 60 mg daily before evening meal. Follow-up PRN.

## 2018-09-24 ENCOUNTER — Ambulatory Visit (HOSPITAL_COMMUNITY)
Admission: RE | Admit: 2018-09-24 | Discharge: 2018-09-24 | Disposition: A | Discharge status: Home / Self Care | Payer: Medicare Other | Source: Ambulatory Visit | Attending: Internal Medicine | Admitting: Internal Medicine

## 2018-09-24 DIAGNOSIS — R071 Chest pain on breathing: Secondary | ICD-10-CM | POA: Diagnosis not present

## 2018-09-24 DIAGNOSIS — J918 Pleural effusion in other conditions classified elsewhere: Secondary | ICD-10-CM | POA: Diagnosis not present

## 2018-09-24 DIAGNOSIS — K838 Other specified diseases of biliary tract: Secondary | ICD-10-CM | POA: Insufficient documentation

## 2018-09-24 DIAGNOSIS — R0602 Shortness of breath: Secondary | ICD-10-CM | POA: Diagnosis not present

## 2018-09-24 DIAGNOSIS — N390 Urinary tract infection, site not specified: Secondary | ICD-10-CM | POA: Diagnosis not present

## 2018-09-24 LAB — HEPATIC FUNCTION PANEL
AG Ratio: 1.2 (calc) (ref 1.0–2.5)
ALT: 29 U/L (ref 6–29)
AST: 36 U/L — ABNORMAL HIGH (ref 10–35)
Albumin: 3.6 g/dL (ref 3.6–5.1)
Alkaline phosphatase (APISO): 75 U/L (ref 37–153)
Bilirubin, Direct: 0.2 mg/dL (ref 0.0–0.2)
Globulin: 3 g/dL (calc) (ref 1.9–3.7)
Indirect Bilirubin: 0.4 mg/dL (calc) (ref 0.2–1.2)
Total Bilirubin: 0.6 mg/dL (ref 0.2–1.2)
Total Protein: 6.6 g/dL (ref 6.1–8.1)

## 2018-09-27 DIAGNOSIS — R0602 Shortness of breath: Secondary | ICD-10-CM | POA: Diagnosis not present

## 2018-09-27 DIAGNOSIS — R071 Chest pain on breathing: Secondary | ICD-10-CM | POA: Diagnosis not present

## 2018-09-27 DIAGNOSIS — J918 Pleural effusion in other conditions classified elsewhere: Secondary | ICD-10-CM | POA: Diagnosis not present

## 2018-09-30 ENCOUNTER — Telehealth: Payer: Self-pay | Admitting: Nurse Practitioner

## 2018-09-30 NOTE — Telephone Encounter (Signed)
Care Alert received for AF episode. Pt with AF starting 7/12 around 3PM.  Known AF, OAC - Eliquis. V rates elevated.  Presenting rhythm AF. Will route to Dr Lovena Le to review.     Chanetta Marshall, NP 09/30/2018 9:50 AM

## 2018-10-06 DIAGNOSIS — R0781 Pleurodynia: Secondary | ICD-10-CM | POA: Diagnosis not present

## 2018-10-06 DIAGNOSIS — I4891 Unspecified atrial fibrillation: Secondary | ICD-10-CM | POA: Diagnosis not present

## 2018-10-06 DIAGNOSIS — J918 Pleural effusion in other conditions classified elsewhere: Secondary | ICD-10-CM | POA: Diagnosis not present

## 2018-10-06 DIAGNOSIS — N189 Chronic kidney disease, unspecified: Secondary | ICD-10-CM | POA: Diagnosis not present

## 2018-10-06 DIAGNOSIS — Z87440 Personal history of urinary (tract) infections: Secondary | ICD-10-CM | POA: Diagnosis not present

## 2018-10-06 DIAGNOSIS — G7000 Myasthenia gravis without (acute) exacerbation: Secondary | ICD-10-CM | POA: Diagnosis not present

## 2018-10-06 DIAGNOSIS — I129 Hypertensive chronic kidney disease with stage 1 through stage 4 chronic kidney disease, or unspecified chronic kidney disease: Secondary | ICD-10-CM | POA: Diagnosis not present

## 2018-10-06 DIAGNOSIS — Z7902 Long term (current) use of antithrombotics/antiplatelets: Secondary | ICD-10-CM | POA: Diagnosis not present

## 2018-10-07 DIAGNOSIS — G7000 Myasthenia gravis without (acute) exacerbation: Secondary | ICD-10-CM | POA: Diagnosis not present

## 2018-10-07 DIAGNOSIS — R0781 Pleurodynia: Secondary | ICD-10-CM | POA: Diagnosis not present

## 2018-10-07 DIAGNOSIS — N189 Chronic kidney disease, unspecified: Secondary | ICD-10-CM | POA: Diagnosis not present

## 2018-10-07 DIAGNOSIS — I4891 Unspecified atrial fibrillation: Secondary | ICD-10-CM | POA: Diagnosis not present

## 2018-10-07 DIAGNOSIS — J918 Pleural effusion in other conditions classified elsewhere: Secondary | ICD-10-CM | POA: Diagnosis not present

## 2018-10-07 DIAGNOSIS — I129 Hypertensive chronic kidney disease with stage 1 through stage 4 chronic kidney disease, or unspecified chronic kidney disease: Secondary | ICD-10-CM | POA: Diagnosis not present

## 2018-10-08 DIAGNOSIS — N183 Chronic kidney disease, stage 3 (moderate): Secondary | ICD-10-CM | POA: Diagnosis not present

## 2018-10-08 DIAGNOSIS — K219 Gastro-esophageal reflux disease without esophagitis: Secondary | ICD-10-CM | POA: Diagnosis not present

## 2018-10-08 DIAGNOSIS — F331 Major depressive disorder, recurrent, moderate: Secondary | ICD-10-CM | POA: Diagnosis not present

## 2018-10-08 DIAGNOSIS — Z950 Presence of cardiac pacemaker: Secondary | ICD-10-CM | POA: Diagnosis not present

## 2018-10-08 DIAGNOSIS — I482 Chronic atrial fibrillation, unspecified: Secondary | ICD-10-CM | POA: Diagnosis not present

## 2018-10-08 DIAGNOSIS — I129 Hypertensive chronic kidney disease with stage 1 through stage 4 chronic kidney disease, or unspecified chronic kidney disease: Secondary | ICD-10-CM | POA: Diagnosis not present

## 2018-10-08 DIAGNOSIS — G894 Chronic pain syndrome: Secondary | ICD-10-CM | POA: Diagnosis not present

## 2018-10-08 DIAGNOSIS — E039 Hypothyroidism, unspecified: Secondary | ICD-10-CM | POA: Diagnosis not present

## 2018-10-08 DIAGNOSIS — G4700 Insomnia, unspecified: Secondary | ICD-10-CM | POA: Diagnosis not present

## 2018-10-08 DIAGNOSIS — Z79891 Long term (current) use of opiate analgesic: Secondary | ICD-10-CM | POA: Diagnosis not present

## 2018-10-08 DIAGNOSIS — G7000 Myasthenia gravis without (acute) exacerbation: Secondary | ICD-10-CM | POA: Diagnosis not present

## 2018-10-08 DIAGNOSIS — E782 Mixed hyperlipidemia: Secondary | ICD-10-CM | POA: Diagnosis not present

## 2018-10-09 DIAGNOSIS — I4891 Unspecified atrial fibrillation: Secondary | ICD-10-CM | POA: Diagnosis not present

## 2018-10-09 DIAGNOSIS — R0781 Pleurodynia: Secondary | ICD-10-CM | POA: Diagnosis not present

## 2018-10-09 DIAGNOSIS — G7000 Myasthenia gravis without (acute) exacerbation: Secondary | ICD-10-CM | POA: Diagnosis not present

## 2018-10-09 DIAGNOSIS — I129 Hypertensive chronic kidney disease with stage 1 through stage 4 chronic kidney disease, or unspecified chronic kidney disease: Secondary | ICD-10-CM | POA: Diagnosis not present

## 2018-10-09 DIAGNOSIS — N189 Chronic kidney disease, unspecified: Secondary | ICD-10-CM | POA: Diagnosis not present

## 2018-10-09 DIAGNOSIS — J918 Pleural effusion in other conditions classified elsewhere: Secondary | ICD-10-CM | POA: Diagnosis not present

## 2018-10-11 DIAGNOSIS — J918 Pleural effusion in other conditions classified elsewhere: Secondary | ICD-10-CM | POA: Diagnosis not present

## 2018-10-11 DIAGNOSIS — G7000 Myasthenia gravis without (acute) exacerbation: Secondary | ICD-10-CM | POA: Diagnosis not present

## 2018-10-11 DIAGNOSIS — N189 Chronic kidney disease, unspecified: Secondary | ICD-10-CM | POA: Diagnosis not present

## 2018-10-11 DIAGNOSIS — I129 Hypertensive chronic kidney disease with stage 1 through stage 4 chronic kidney disease, or unspecified chronic kidney disease: Secondary | ICD-10-CM | POA: Diagnosis not present

## 2018-10-11 DIAGNOSIS — R0781 Pleurodynia: Secondary | ICD-10-CM | POA: Diagnosis not present

## 2018-10-11 DIAGNOSIS — I4891 Unspecified atrial fibrillation: Secondary | ICD-10-CM | POA: Diagnosis not present

## 2018-10-14 DIAGNOSIS — G7000 Myasthenia gravis without (acute) exacerbation: Secondary | ICD-10-CM | POA: Diagnosis not present

## 2018-10-14 DIAGNOSIS — I129 Hypertensive chronic kidney disease with stage 1 through stage 4 chronic kidney disease, or unspecified chronic kidney disease: Secondary | ICD-10-CM | POA: Diagnosis not present

## 2018-10-14 DIAGNOSIS — J918 Pleural effusion in other conditions classified elsewhere: Secondary | ICD-10-CM | POA: Diagnosis not present

## 2018-10-14 DIAGNOSIS — N189 Chronic kidney disease, unspecified: Secondary | ICD-10-CM | POA: Diagnosis not present

## 2018-10-14 DIAGNOSIS — I4891 Unspecified atrial fibrillation: Secondary | ICD-10-CM | POA: Diagnosis not present

## 2018-10-14 DIAGNOSIS — R0781 Pleurodynia: Secondary | ICD-10-CM | POA: Diagnosis not present

## 2018-10-16 DIAGNOSIS — N189 Chronic kidney disease, unspecified: Secondary | ICD-10-CM | POA: Diagnosis not present

## 2018-10-16 DIAGNOSIS — R0781 Pleurodynia: Secondary | ICD-10-CM | POA: Diagnosis not present

## 2018-10-16 DIAGNOSIS — G7000 Myasthenia gravis without (acute) exacerbation: Secondary | ICD-10-CM | POA: Diagnosis not present

## 2018-10-16 DIAGNOSIS — I129 Hypertensive chronic kidney disease with stage 1 through stage 4 chronic kidney disease, or unspecified chronic kidney disease: Secondary | ICD-10-CM | POA: Diagnosis not present

## 2018-10-16 DIAGNOSIS — J918 Pleural effusion in other conditions classified elsewhere: Secondary | ICD-10-CM | POA: Diagnosis not present

## 2018-10-16 DIAGNOSIS — I4891 Unspecified atrial fibrillation: Secondary | ICD-10-CM | POA: Diagnosis not present

## 2018-10-17 DIAGNOSIS — N189 Chronic kidney disease, unspecified: Secondary | ICD-10-CM | POA: Diagnosis not present

## 2018-10-17 DIAGNOSIS — G7000 Myasthenia gravis without (acute) exacerbation: Secondary | ICD-10-CM | POA: Diagnosis not present

## 2018-10-17 DIAGNOSIS — J918 Pleural effusion in other conditions classified elsewhere: Secondary | ICD-10-CM | POA: Diagnosis not present

## 2018-10-17 DIAGNOSIS — I4891 Unspecified atrial fibrillation: Secondary | ICD-10-CM | POA: Diagnosis not present

## 2018-10-17 DIAGNOSIS — I129 Hypertensive chronic kidney disease with stage 1 through stage 4 chronic kidney disease, or unspecified chronic kidney disease: Secondary | ICD-10-CM | POA: Diagnosis not present

## 2018-10-17 DIAGNOSIS — R0781 Pleurodynia: Secondary | ICD-10-CM | POA: Diagnosis not present

## 2018-10-22 DIAGNOSIS — I4891 Unspecified atrial fibrillation: Secondary | ICD-10-CM | POA: Diagnosis not present

## 2018-10-22 DIAGNOSIS — R0781 Pleurodynia: Secondary | ICD-10-CM | POA: Diagnosis not present

## 2018-10-22 DIAGNOSIS — N189 Chronic kidney disease, unspecified: Secondary | ICD-10-CM | POA: Diagnosis not present

## 2018-10-22 DIAGNOSIS — G7000 Myasthenia gravis without (acute) exacerbation: Secondary | ICD-10-CM | POA: Diagnosis not present

## 2018-10-22 DIAGNOSIS — I129 Hypertensive chronic kidney disease with stage 1 through stage 4 chronic kidney disease, or unspecified chronic kidney disease: Secondary | ICD-10-CM | POA: Diagnosis not present

## 2018-10-22 DIAGNOSIS — J918 Pleural effusion in other conditions classified elsewhere: Secondary | ICD-10-CM | POA: Diagnosis not present

## 2018-10-24 DIAGNOSIS — D447 Neoplasm of uncertain behavior of aortic body and other paraganglia: Secondary | ICD-10-CM | POA: Diagnosis not present

## 2018-10-24 DIAGNOSIS — E042 Nontoxic multinodular goiter: Secondary | ICD-10-CM | POA: Diagnosis not present

## 2018-10-24 DIAGNOSIS — Z1589 Genetic susceptibility to other disease: Secondary | ICD-10-CM | POA: Diagnosis not present

## 2018-10-24 DIAGNOSIS — C755 Malignant neoplasm of aortic body and other paraganglia: Secondary | ICD-10-CM | POA: Diagnosis not present

## 2018-10-29 DIAGNOSIS — G4700 Insomnia, unspecified: Secondary | ICD-10-CM | POA: Diagnosis not present

## 2018-10-29 DIAGNOSIS — F331 Major depressive disorder, recurrent, moderate: Secondary | ICD-10-CM | POA: Diagnosis not present

## 2018-10-29 DIAGNOSIS — Z950 Presence of cardiac pacemaker: Secondary | ICD-10-CM | POA: Diagnosis not present

## 2018-10-29 DIAGNOSIS — I482 Chronic atrial fibrillation, unspecified: Secondary | ICD-10-CM | POA: Diagnosis not present

## 2018-10-29 DIAGNOSIS — G7000 Myasthenia gravis without (acute) exacerbation: Secondary | ICD-10-CM | POA: Diagnosis not present

## 2018-10-29 DIAGNOSIS — Z79891 Long term (current) use of opiate analgesic: Secondary | ICD-10-CM | POA: Diagnosis not present

## 2018-10-29 DIAGNOSIS — E782 Mixed hyperlipidemia: Secondary | ICD-10-CM | POA: Diagnosis not present

## 2018-10-29 DIAGNOSIS — G894 Chronic pain syndrome: Secondary | ICD-10-CM | POA: Diagnosis not present

## 2018-10-29 DIAGNOSIS — N183 Chronic kidney disease, stage 3 (moderate): Secondary | ICD-10-CM | POA: Diagnosis not present

## 2018-10-29 DIAGNOSIS — I129 Hypertensive chronic kidney disease with stage 1 through stage 4 chronic kidney disease, or unspecified chronic kidney disease: Secondary | ICD-10-CM | POA: Diagnosis not present

## 2018-10-29 DIAGNOSIS — K219 Gastro-esophageal reflux disease without esophagitis: Secondary | ICD-10-CM | POA: Diagnosis not present

## 2018-10-29 DIAGNOSIS — E039 Hypothyroidism, unspecified: Secondary | ICD-10-CM | POA: Diagnosis not present

## 2018-10-31 ENCOUNTER — Other Ambulatory Visit (HOSPITAL_COMMUNITY): Payer: Self-pay | Admitting: Adult Health Nurse Practitioner

## 2018-10-31 DIAGNOSIS — Z8709 Personal history of other diseases of the respiratory system: Secondary | ICD-10-CM

## 2018-11-05 ENCOUNTER — Ambulatory Visit (HOSPITAL_COMMUNITY)
Admission: RE | Admit: 2018-11-05 | Discharge: 2018-11-05 | Disposition: A | Discharge status: Home / Self Care | Payer: Medicare Other | Source: Ambulatory Visit | Attending: Adult Health Nurse Practitioner | Admitting: Adult Health Nurse Practitioner

## 2018-11-05 ENCOUNTER — Other Ambulatory Visit: Payer: Self-pay

## 2018-11-05 ENCOUNTER — Ambulatory Visit (INDEPENDENT_AMBULATORY_CARE_PROVIDER_SITE_OTHER): Payer: Medicare Other | Admitting: *Deleted

## 2018-11-05 DIAGNOSIS — Z8709 Personal history of other diseases of the respiratory system: Secondary | ICD-10-CM | POA: Insufficient documentation

## 2018-11-05 DIAGNOSIS — R0789 Other chest pain: Secondary | ICD-10-CM | POA: Diagnosis not present

## 2018-11-05 DIAGNOSIS — J90 Pleural effusion, not elsewhere classified: Secondary | ICD-10-CM | POA: Diagnosis not present

## 2018-11-05 DIAGNOSIS — I495 Sick sinus syndrome: Secondary | ICD-10-CM

## 2018-11-05 DIAGNOSIS — R0602 Shortness of breath: Secondary | ICD-10-CM | POA: Diagnosis not present

## 2018-11-05 DIAGNOSIS — I5032 Chronic diastolic (congestive) heart failure: Secondary | ICD-10-CM

## 2018-11-05 LAB — CUP PACEART REMOTE DEVICE CHECK
Battery Remaining Longevity: 138 mo
Battery Voltage: 3.03 V
Brady Statistic AP VP Percent: 0.04 %
Brady Statistic AP VS Percent: 62.11 %
Brady Statistic AS VP Percent: 0.02 %
Brady Statistic AS VS Percent: 37.84 %
Brady Statistic RA Percent Paced: 61.54 %
Brady Statistic RV Percent Paced: 0.07 %
Date Time Interrogation Session: 20200818053320
Implantable Lead Implant Date: 20190501
Implantable Lead Implant Date: 20190501
Implantable Lead Location: 753859
Implantable Lead Location: 753860
Implantable Lead Model: 3830
Implantable Lead Model: 5076
Implantable Pulse Generator Implant Date: 20190501
Lead Channel Impedance Value: 304 Ohm
Lead Channel Impedance Value: 361 Ohm
Lead Channel Impedance Value: 361 Ohm
Lead Channel Impedance Value: 494 Ohm
Lead Channel Pacing Threshold Amplitude: 0.875 V
Lead Channel Pacing Threshold Amplitude: 0.875 V
Lead Channel Pacing Threshold Pulse Width: 0.4 ms
Lead Channel Pacing Threshold Pulse Width: 0.4 ms
Lead Channel Sensing Intrinsic Amplitude: 0.5 mV
Lead Channel Sensing Intrinsic Amplitude: 0.5 mV
Lead Channel Sensing Intrinsic Amplitude: 8.5 mV
Lead Channel Sensing Intrinsic Amplitude: 8.5 mV
Lead Channel Setting Pacing Amplitude: 2 V
Lead Channel Setting Pacing Amplitude: 2.5 V
Lead Channel Setting Pacing Pulse Width: 0.4 ms
Lead Channel Setting Sensing Sensitivity: 0.9 mV

## 2018-11-06 DIAGNOSIS — E039 Hypothyroidism, unspecified: Secondary | ICD-10-CM | POA: Diagnosis not present

## 2018-11-06 DIAGNOSIS — I10 Essential (primary) hypertension: Secondary | ICD-10-CM | POA: Diagnosis not present

## 2018-11-06 DIAGNOSIS — F331 Major depressive disorder, recurrent, moderate: Secondary | ICD-10-CM | POA: Diagnosis not present

## 2018-11-06 DIAGNOSIS — E782 Mixed hyperlipidemia: Secondary | ICD-10-CM | POA: Diagnosis not present

## 2018-11-06 DIAGNOSIS — G7000 Myasthenia gravis without (acute) exacerbation: Secondary | ICD-10-CM | POA: Diagnosis not present

## 2018-11-06 DIAGNOSIS — G4700 Insomnia, unspecified: Secondary | ICD-10-CM | POA: Diagnosis not present

## 2018-11-06 DIAGNOSIS — N183 Chronic kidney disease, stage 3 (moderate): Secondary | ICD-10-CM | POA: Diagnosis not present

## 2018-11-06 DIAGNOSIS — M25511 Pain in right shoulder: Secondary | ICD-10-CM | POA: Diagnosis not present

## 2018-11-06 DIAGNOSIS — I482 Chronic atrial fibrillation, unspecified: Secondary | ICD-10-CM | POA: Diagnosis not present

## 2018-11-06 DIAGNOSIS — R0602 Shortness of breath: Secondary | ICD-10-CM | POA: Diagnosis not present

## 2018-11-06 DIAGNOSIS — N644 Mastodynia: Secondary | ICD-10-CM | POA: Diagnosis not present

## 2018-11-06 DIAGNOSIS — I4891 Unspecified atrial fibrillation: Secondary | ICD-10-CM | POA: Diagnosis not present

## 2018-11-06 DIAGNOSIS — G894 Chronic pain syndrome: Secondary | ICD-10-CM | POA: Diagnosis not present

## 2018-11-06 DIAGNOSIS — R822 Biliuria: Secondary | ICD-10-CM | POA: Diagnosis not present

## 2018-11-11 ENCOUNTER — Telehealth (INDEPENDENT_AMBULATORY_CARE_PROVIDER_SITE_OTHER): Payer: Self-pay | Admitting: Internal Medicine

## 2018-11-11 NOTE — Telephone Encounter (Signed)
Patient's daughter called regarding referral - please call [phone]

## 2018-11-12 ENCOUNTER — Other Ambulatory Visit (HOSPITAL_COMMUNITY)
Admission: RE | Admit: 2018-11-12 | Discharge: 2018-11-12 | Disposition: A | Discharge status: Home / Self Care | Payer: Medicare Other | Source: Ambulatory Visit | Attending: Cardiovascular Disease | Admitting: Cardiovascular Disease

## 2018-11-12 ENCOUNTER — Other Ambulatory Visit: Payer: Self-pay

## 2018-11-12 ENCOUNTER — Ambulatory Visit (INDEPENDENT_AMBULATORY_CARE_PROVIDER_SITE_OTHER): Payer: Medicare Other | Admitting: Cardiovascular Disease

## 2018-11-12 ENCOUNTER — Telehealth: Payer: Self-pay | Admitting: *Deleted

## 2018-11-12 ENCOUNTER — Other Ambulatory Visit (HOSPITAL_COMMUNITY): Payer: Medicare Other

## 2018-11-12 ENCOUNTER — Other Ambulatory Visit: Payer: Self-pay | Admitting: Cardiovascular Disease

## 2018-11-12 ENCOUNTER — Encounter: Payer: Self-pay | Admitting: Cardiovascular Disease

## 2018-11-12 VITALS — BP 147/87 | HR 72 | Temp 97.7°F | Wt 192.0 lb

## 2018-11-12 DIAGNOSIS — R079 Chest pain, unspecified: Secondary | ICD-10-CM | POA: Diagnosis not present

## 2018-11-12 DIAGNOSIS — I10 Essential (primary) hypertension: Secondary | ICD-10-CM | POA: Diagnosis not present

## 2018-11-12 DIAGNOSIS — I480 Paroxysmal atrial fibrillation: Secondary | ICD-10-CM | POA: Diagnosis not present

## 2018-11-12 DIAGNOSIS — R001 Bradycardia, unspecified: Secondary | ICD-10-CM | POA: Diagnosis not present

## 2018-11-12 DIAGNOSIS — R0609 Other forms of dyspnea: Secondary | ICD-10-CM | POA: Insufficient documentation

## 2018-11-12 DIAGNOSIS — Z950 Presence of cardiac pacemaker: Secondary | ICD-10-CM

## 2018-11-12 LAB — BASIC METABOLIC PANEL
Anion gap: 7 (ref 5–15)
BUN: 17 mg/dL (ref 8–23)
CO2: 25 mmol/L (ref 22–32)
Calcium: 9.2 mg/dL (ref 8.9–10.3)
Chloride: 104 mmol/L (ref 98–111)
Creatinine, Ser: 1.12 mg/dL — ABNORMAL HIGH (ref 0.44–1.00)
GFR calc Af Amer: 54 mL/min — ABNORMAL LOW (ref >60)
GFR calc non Af Amer: 46 mL/min — ABNORMAL LOW (ref >60)
Glucose, Bld: 97 mg/dL (ref 70–99)
Potassium: 4 mmol/L (ref 3.5–5.1)
Sodium: 136 mmol/L (ref 135–145)

## 2018-11-12 LAB — CBC WITH DIFFERENTIAL/PLATELET
Abs Immature Granulocytes: 0.03 10*3/uL (ref 0.00–0.07)
Basophils Absolute: 0.1 10*3/uL (ref 0.0–0.1)
Basophils Relative: 1 %
Eosinophils Absolute: 0.1 10*3/uL (ref 0.0–0.5)
Eosinophils Relative: 2 %
HCT: 42.4 % (ref 36.0–46.0)
Hemoglobin: 13.4 g/dL (ref 12.0–15.0)
Immature Granulocytes: 0 %
Lymphocytes Relative: 26 %
Lymphs Abs: 2.1 10*3/uL (ref 0.7–4.0)
MCH: 31.9 pg (ref 26.0–34.0)
MCHC: 31.6 g/dL (ref 30.0–36.0)
MCV: 101 fL — ABNORMAL HIGH (ref 80.0–100.0)
Monocytes Absolute: 0.6 10*3/uL (ref 0.1–1.0)
Monocytes Relative: 8 %
Neutro Abs: 5.2 10*3/uL (ref 1.7–7.7)
Neutrophils Relative %: 63 %
Platelets: 182 10*3/uL (ref 150–400)
RBC: 4.2 MIL/uL (ref 3.87–5.11)
RDW: 13.1 % (ref 11.5–15.5)
WBC: 8.2 10*3/uL (ref 4.0–10.5)
nRBC: 0 % (ref 0.0–0.2)

## 2018-11-12 MED ORDER — SODIUM CHLORIDE 0.9% FLUSH: 3.0000 mL | Freq: Two times a day (BID) | INTRAVENOUS | Status: DC

## 2018-11-12 NOTE — H&P (View-Only) (Signed)
SUBJECTIVE: The patient presents for routine follow-up.  She was evaluated in early July in the ED for chest pain which appeared to be noncardiac in etiology.  She received a host of various medications to control her pain and nitroglycerin did not have much effect.  She has a history of symptomatic bradycardia status post pacemaker insertion and follows with EP.    She saw her PCP recently on 11/06/2018.  I reviewed records from that visit.  She has been complaining of significant shortness of breath when walking short distances.  She has had mild chest pains.  Chest x-ray on 11/05/2018 showed no pleural effusions.  CT angiography of the chest on 09/21/2018 showed no evidence of acute aortic dissection, pulmonary embolus, or other acute cardiopulmonary abnormality.  Trace aortic atherosclerotic plaque was seen.  A daughter of hers died last year of an MI and her son died in his early 78s of an MI this past April.  She is here with 1 of her other daughters.  She has been experiencing exertional chest pain and dyspnea.  She now gets short of breath and has exertional chest tightness when walking from her bedroom to the bathroom.  She used to be able to walk 25 laps around her driveway and now can only walk about 5 laps slowly due to the same symptoms.   Review of Systems: As per "subjective", otherwise negative.  Allergies  Allergen Reactions  . Nitrofurantoin Macrocrystal Nausea And Vomiting  . Ampicillin Rash  . Avocado Diarrhea and Nausea And Vomiting  . Biaxin [Clarithromycin] Rash  . Doxycycline Monohydrate Nausea And Vomiting  . Moxifloxacin     Contraindication myasthenia gravis  . Tetracyclines & Related Nausea And Vomiting  . Codeine Itching    Can not take Codeine unless in cough syrup Can not take Codeine unless in cough syrup    Current Outpatient Medications  Medication Sig Dispense Refill  . ALPRAZolam (XANAX) 1 MG tablet Take 1 mg by mouth 2 (two) times daily  as needed.     Marland Kitchen atenolol (TENORMIN) 25 MG tablet Take one tablet by mouth daily.  May take an additional tablet for systolic [id number] or severe palpitations. 180 tablet 3  . b complex vitamins tablet Take 1 tablet by mouth daily.    Marland Kitchen buPROPion (WELLBUTRIN XL) 150 MG 24 hr tablet Take 150 mg by mouth daily.    . cholecalciferol (VITAMIN D) 1000 units tablet Take 5,000 Units by mouth 2 (two) times a day.     . Coenzyme Q10 (CO Q 10) 10 MG CAPS Take 10 mg by mouth every evening.     . Cyanocobalamin (B-12) 2500 MCG SUBL Place 1 tablet under the tongue 2 (two) times a day. Time release    . diclofenac sodium (VOLTAREN) 1 % GEL Apply 4 g topically 4 (four) times daily as needed (for pain).     Marland Kitchen ELIQUIS 5 MG TABS tablet TAKE (1) TABLET BY MOUTH TWICE DAILY. 180 tablet 1  . fluticasone (CUTIVATE) 0.05 % cream Apply 1 application topically daily as needed.    Marland Kitchen ketoconazole (NIZORAL) 2 % cream Apply 1 application topically daily as needed.    . lansoprazole (PREVACID) 30 MG capsule TAKE (1) CAPSULE BY MOUTH TWICE DAILY. (Patient taking differently: Take 60 mg by mouth daily. ) 180 capsule 0  . levothyroxine (SYNTHROID) 112 MCG tablet Take 112 mcg by mouth daily before breakfast.    . lidocaine (LIDODERM) 5 % Place  1-3 patches onto the skin daily as needed (for pain). Remove & Discard patch within 12 hours or as directed by MD for pain    . Magnesium 250 MG TABS Take 500 mg by mouth every evening.     Marland Kitchen OVER THE COUNTER MEDICATION Place 1 drop into both eyes 4 (four) times daily. Sooth XP Eye drops uses four times daily.     Marland Kitchen oxyCODONE-acetaminophen (PERCOCET) 10-325 MG tablet Take 1 tablet by mouth every 6 (six) hours as needed.     . pravastatin (PRAVACHOL) 80 MG tablet Take 80 mg by mouth every evening.     . pyridostigmine (MESTINON) 60 MG tablet Take 0.5 tablets (30 mg total) by mouth 4 (four) times daily. 180 tablet 3  . sulfamethoxazole-trimethoprim (BACTRIM) 400-80 MG tablet Take 1 tablet by mouth  2 (two) times daily.    . SUMAtriptan (IMITREX) 50 MG tablet Take 50 mg by mouth every 2 (two) hours as needed. For migraines    . Terbinafine 1 % GEL Apply topically 2 (two) times a day.      No current facility-administered medications for this visit.     Past Medical History:  Diagnosis Date  . Anemia   . Chronic back pain   . Chronic nausea   . Complication of anesthesia    Low blood pressure, heart rate, O2 sat  . Depression with anxiety   . Depression with anxiety   . Diverticulosis   . Dyspnea   . Essential hypertension   . GERD (gastroesophageal reflux disease)   . History of pneumonia   . Hypothyroidism   . IBS (irritable colon syndrome)   . LVH (left ventricular hypertrophy)   . Migraines   . Mixed hyperlipidemia   . Obesity   . Ocular myasthenia gravis ([place])   . Osteoarthritis of knee   . PAF (paroxysmal atrial fibrillation) ([facility])    chads2vasc score is at least 4  . Paraganglioma ([place])    Resection 02/2012 (retroperitoneal)  . Paraganglioma, malignant ([place]) 11/06/2015  . Presence of permanent cardiac pacemaker 07/18/2017  . Renal insufficiency   . Skin cancer     Past Surgical History:  Procedure Laterality Date  . ABDOMINAL HYSTERECTOMY    . ABDOMINAL HYSTERECTOMY    . CARDIAC CATHETERIZATION    . CHOLECYSTECTOMY    . COLONOSCOPY  08/24/2011   Procedure: COLONOSCOPY;  Surgeon: Rogene Houston, MD;  Location: [facility];  Service: Endoscopy;  Laterality: N/A;  1200  . CT guided biopsy  01/16/12  . GIVENS CAPSULE STUDY  01/29/2012   Procedure: GIVENS CAPSULE STUDY;  Surgeon: Rogene Houston, MD;  Location: [facility];  Service: Endoscopy;  Laterality: N/A;  730  . KNEE SURGERY    . LAPAROTOMY  03/01/2012   Procedure: EXPLORATORY LAPAROTOMY;  Surgeon: Earnstine Regal, MD;  Location: [facility];  Service: General;  Laterality: N/A;  Exploratory Laparotomy ,Resection Retroperitoneal Mass  . NASAL SINUS SURGERY     30 yrs ago  . PACEMAKER IMPLANT N/A  07/18/2017   Procedure: PACEMAKER IMPLANT;  Surgeon: Evans Lance, MD;  Location: [facility];  Service: Cardiovascular;  Laterality: N/A;  . TOTAL KNEE ARTHROPLASTY Bilateral    2005 and 2011    Social History   Socioeconomic History  . Marital status: Widowed    Spouse name: Not on file  . Number of children: Not on file  . Years of education: Not on file  . Highest  education level: Not on file  Occupational History  . Occupation: Retired Quarry manager  Social Needs  . Financial resource strain: Not on file  . Food insecurity    Worry: Not on file    Inability: Not on file  . Transportation needs    Medical: Not on file    Non-medical: Not on file  Tobacco Use  . Smoking status: Never Smoker  . Smokeless tobacco: Never Used  Substance and Sexual Activity  . Alcohol use: No    Alcohol/week: 0.0 standard drinks  . Drug use: No  . Sexual activity: Not on file  Lifestyle  . Physical activity    Days per week: Not on file    Minutes per session: Not on file  . Stress: Not on file  Relationships  . Social Herbalist on phone: Not on file    Gets together: Not on file    Attends religious service: Not on file    Active member of club or organization: Not on file    Attends meetings of clubs or organizations: Not on file    Relationship status: Not on file  . Intimate partner violence    Fear of current or ex partner: Not on file    Emotionally abused: Not on file    Physically abused: Not on file    Forced sexual activity: Not on file  Other Topics Concern  . Not on file  Social History Narrative   Lives in [place] alone.  Retired Quarry manager.   Caffeine use:    Right handed     Vitals:   11/12/18 1332  BP: (!) 147/87  Pulse: 72  Temp: 97.7 F (36.5 C)  Weight: 192 lb (87.1 kg)    Wt Readings from Last 3 Encounters:  11/12/18 192 lb (87.1 kg)  09/23/18 193 lb 6.4 oz (87.7 kg)  09/21/18 194 lb (88 kg)     PHYSICAL EXAM General:  NAD HEENT: Normal. Neck: No JVD, no thyromegaly. Lungs: Clear to auscultation bilaterally with normal respiratory effort. CV: Regular rate and rhythm, normal S1/S2, no S3/S4, no murmur. No pretibial or periankle edema.   Abdomen: Soft, nontender, no distention.  Neurologic: Alert and oriented.  Psych: Normal affect. Skin: Normal. Musculoskeletal: No gross deformities.    ECG: Reviewed above under Subjective   Labs: Lab Results  Component Value Date/Time   K 4.0 09/21/2018 07:43 AM   BUN 20 09/21/2018 07:43 AM   BUN 23 09/19/2017 12:51 PM   CREATININE 1.07 (H) 09/21/2018 07:43 AM   CREATININE 1.05 (H) 05/04/2015 03:59 PM   ALT 29 09/23/2018 03:30 PM   TSH 1.760 09/19/2017 12:51 PM   HGB 13.3 09/21/2018 07:43 AM   HGB 14.1 09/19/2017 12:51 PM     Lipids: No results found for: LDLCALC, LDLDIRECT, CHOL, TRIG, HDL     Echocardiogram 09/19/2017:  Study Conclusions  - Left ventricle: The cavity size was normal. Wall thickness was   normal. Systolic function was normal. The estimated ejection   fraction was in the range of 60% to 65%. Wall motion was normal;   there were no regional wall motion abnormalities. Doppler   parameters are consistent with abnormal left ventricular   relaxation (grade 1 diastolic dysfunction). The E/e&' ratio is   between 8-15, suggesting indeterminate LV filling pressure. - Left atrium: Moderately dilated. - Inferior vena cava: The vessel was normal in size. The   respirophasic diameter changes were in the normal  range (>= 50%),   consistent with normal central venous pressure.  Impressions:  - Compared to a prior study in 2018, there are no significant   changes.    ASSESSMENT AND PLAN: 1.  Tachy-brady syndrome status post dual-chamber pacemaker implantation on 07/18/2017: Normal device function.  Follows with Dr. Lovena Le.   2.  Paroxysmal atrial fibrillation: Currently on atenolol. Systemically anticoagulated with Eliquis 5 mg twice  daily.  3.  Shortness of breath: No obvious cardiopulmonary abnormalities by CT in July or chest x-ray this month.  She underwent a low risk nuclear stress test on 06/23/2016. She has a strong family h/o heart disease. I am suspicious her symptoms are anginal. I will arrange for right and left heart catheterization and coronary angiography. Risks and benefits of cardiac catheterization have been discussed with the patient.  These include bleeding, infection, kidney damage, stroke, heart attack, death.  The patient understands these risks and is willing to proceed. She is on a beta blocker and statin. No ASA as she is on Eliquis.   Disposition: Follow up 1 month after cath  A high level of decision making was required for increased medical complexities.    [name], M.D., F.A.C.C.

## 2018-11-12 NOTE — Patient Instructions (Signed)
Medication Instructions:  Your physician recommends that you continue on your current medications as directed. Please refer to the Current Medication list given to you today.   Labwork: Today   bmet Cbc  Covid   Testing/Procedures: Your physician has requested that you have a cardiac catheterization. Cardiac catheterization is used to diagnose and/or treat various heart conditions. Doctors may recommend this procedure for a number of different reasons. The most common reason is to evaluate chest pain. Chest pain can be a symptom of coronary artery disease (CAD), and cardiac catheterization can show whether plaque is narrowing or blocking your heart's arteries. This procedure is also used to evaluate the valves, as well as measure the blood flow and oxygen levels in different parts of your heart. For further information please visit [email or web address]. Please follow instruction sheet, as given.    Follow-Up: Your physician recommends that you schedule a follow-up appointment in: 1 month post cath   Any Other Special Instructions Will Be Listed Below (If Applicable).     If you need a refill on your cardiac medications before your next appointment, please call your pharmacy.

## 2018-11-12 NOTE — Progress Notes (Signed)
SUBJECTIVE: The patient presents for routine follow-up.  She was evaluated in early July in the ED for chest pain which appeared to be noncardiac in etiology.  She received a host of various medications to control her pain and nitroglycerin did not have much effect.  She has a history of symptomatic bradycardia status post pacemaker insertion and follows with EP.    She saw her PCP recently on 11/06/2018.  I reviewed records from that visit.  She has been complaining of significant shortness of breath when walking short distances.  She has had mild chest pains.  Chest x-ray on 11/05/2018 showed no pleural effusions.  CT angiography of the chest on 09/21/2018 showed no evidence of acute aortic dissection, pulmonary embolus, or other acute cardiopulmonary abnormality.  Trace aortic atherosclerotic plaque was seen.  A daughter of hers died last year of an MI and her son died in his early 12s of an MI this past April.  She is here with 1 of her other daughters.  She has been experiencing exertional chest pain and dyspnea.  She now gets short of breath and has exertional chest tightness when walking from her bedroom to the bathroom.  She used to be able to walk 25 laps around her driveway and now can only walk about 5 laps slowly due to the same symptoms.   Review of Systems: As per "subjective", otherwise negative.  Allergies  Allergen Reactions  . Nitrofurantoin Macrocrystal Nausea And Vomiting  . Ampicillin Rash  . Avocado Diarrhea and Nausea And Vomiting  . Biaxin [Clarithromycin] Rash  . Doxycycline Monohydrate Nausea And Vomiting  . Moxifloxacin     Contraindication myasthenia gravis  . Tetracyclines & Related Nausea And Vomiting  . Codeine Itching    Can not take Codeine unless in cough syrup Can not take Codeine unless in cough syrup    Current Outpatient Medications  Medication Sig Dispense Refill  . ALPRAZolam (XANAX) 1 MG tablet Take 1 mg by mouth 2 (two) times daily  as needed.     Marland Kitchen atenolol (TENORMIN) 25 MG tablet Take one tablet by mouth daily.  May take an additional tablet for systolic [id number] or severe palpitations. 180 tablet 3  . b complex vitamins tablet Take 1 tablet by mouth daily.    Marland Kitchen buPROPion (WELLBUTRIN XL) 150 MG 24 hr tablet Take 150 mg by mouth daily.    . cholecalciferol (VITAMIN D) 1000 units tablet Take 5,000 Units by mouth 2 (two) times a day.     . Coenzyme Q10 (CO Q 10) 10 MG CAPS Take 10 mg by mouth every evening.     . Cyanocobalamin (B-12) 2500 MCG SUBL Place 1 tablet under the tongue 2 (two) times a day. Time release    . diclofenac sodium (VOLTAREN) 1 % GEL Apply 4 g topically 4 (four) times daily as needed (for pain).     Marland Kitchen ELIQUIS 5 MG TABS tablet TAKE (1) TABLET BY MOUTH TWICE DAILY. 180 tablet 1  . fluticasone (CUTIVATE) 0.05 % cream Apply 1 application topically daily as needed.    Marland Kitchen ketoconazole (NIZORAL) 2 % cream Apply 1 application topically daily as needed.    . lansoprazole (PREVACID) 30 MG capsule TAKE (1) CAPSULE BY MOUTH TWICE DAILY. (Patient taking differently: Take 60 mg by mouth daily. ) 180 capsule 0  . levothyroxine (SYNTHROID) 112 MCG tablet Take 112 mcg by mouth daily before breakfast.    . lidocaine (LIDODERM) 5 % Place  1-3 patches onto the skin daily as needed (for pain). Remove & Discard patch within 12 hours or as directed by MD for pain    . Magnesium 250 MG TABS Take 500 mg by mouth every evening.     Marland Kitchen OVER THE COUNTER MEDICATION Place 1 drop into both eyes 4 (four) times daily. Sooth XP Eye drops uses four times daily.     Marland Kitchen oxyCODONE-acetaminophen (PERCOCET) 10-325 MG tablet Take 1 tablet by mouth every 6 (six) hours as needed.     . pravastatin (PRAVACHOL) 80 MG tablet Take 80 mg by mouth every evening.     . pyridostigmine (MESTINON) 60 MG tablet Take 0.5 tablets (30 mg total) by mouth 4 (four) times daily. 180 tablet 3  . sulfamethoxazole-trimethoprim (BACTRIM) 400-80 MG tablet Take 1 tablet by mouth  2 (two) times daily.    . SUMAtriptan (IMITREX) 50 MG tablet Take 50 mg by mouth every 2 (two) hours as needed. For migraines    . Terbinafine 1 % GEL Apply topically 2 (two) times a day.      No current facility-administered medications for this visit.     Past Medical History:  Diagnosis Date  . Anemia   . Chronic back pain   . Chronic nausea   . Complication of anesthesia    Low blood pressure, heart rate, O2 sat  . Depression with anxiety   . Depression with anxiety   . Diverticulosis   . Dyspnea   . Essential hypertension   . GERD (gastroesophageal reflux disease)   . History of pneumonia   . Hypothyroidism   . IBS (irritable colon syndrome)   . LVH (left ventricular hypertrophy)   . Migraines   . Mixed hyperlipidemia   . Obesity   . Ocular myasthenia gravis ([place])   . Osteoarthritis of knee   . PAF (paroxysmal atrial fibrillation) ([facility])    chads2vasc score is at least 4  . Paraganglioma ([place])    Resection 02/2012 (retroperitoneal)  . Paraganglioma, malignant ([place]) 11/06/2015  . Presence of permanent cardiac pacemaker 07/18/2017  . Renal insufficiency   . Skin cancer     Past Surgical History:  Procedure Laterality Date  . ABDOMINAL HYSTERECTOMY    . ABDOMINAL HYSTERECTOMY    . CARDIAC CATHETERIZATION    . CHOLECYSTECTOMY    . COLONOSCOPY  08/24/2011   Procedure: COLONOSCOPY;  Surgeon: Rogene Houston, MD;  Location: [facility];  Service: Endoscopy;  Laterality: N/A;  1200  . CT guided biopsy  01/16/12  . GIVENS CAPSULE STUDY  01/29/2012   Procedure: GIVENS CAPSULE STUDY;  Surgeon: Rogene Houston, MD;  Location: [facility];  Service: Endoscopy;  Laterality: N/A;  730  . KNEE SURGERY    . LAPAROTOMY  03/01/2012   Procedure: EXPLORATORY LAPAROTOMY;  Surgeon: Earnstine Regal, MD;  Location: [facility];  Service: General;  Laterality: N/A;  Exploratory Laparotomy ,Resection Retroperitoneal Mass  . NASAL SINUS SURGERY     30 yrs ago  . PACEMAKER IMPLANT N/A  07/18/2017   Procedure: PACEMAKER IMPLANT;  Surgeon: Evans Lance, MD;  Location: [facility];  Service: Cardiovascular;  Laterality: N/A;  . TOTAL KNEE ARTHROPLASTY Bilateral    2005 and 2011    Social History   Socioeconomic History  . Marital status: Widowed    Spouse name: Not on file  . Number of children: Not on file  . Years of education: Not on file  . Highest  education level: Not on file  Occupational History  . Occupation: Retired Quarry manager  Social Needs  . Financial resource strain: Not on file  . Food insecurity    Worry: Not on file    Inability: Not on file  . Transportation needs    Medical: Not on file    Non-medical: Not on file  Tobacco Use  . Smoking status: Never Smoker  . Smokeless tobacco: Never Used  Substance and Sexual Activity  . Alcohol use: No    Alcohol/week: 0.0 standard drinks  . Drug use: No  . Sexual activity: Not on file  Lifestyle  . Physical activity    Days per week: Not on file    Minutes per session: Not on file  . Stress: Not on file  Relationships  . Social Herbalist on phone: Not on file    Gets together: Not on file    Attends religious service: Not on file    Active member of club or organization: Not on file    Attends meetings of clubs or organizations: Not on file    Relationship status: Not on file  . Intimate partner violence    Fear of current or ex partner: Not on file    Emotionally abused: Not on file    Physically abused: Not on file    Forced sexual activity: Not on file  Other Topics Concern  . Not on file  Social History Narrative   Lives in [place] alone.  Retired Quarry manager.   Caffeine use:    Right handed     Vitals:   11/12/18 1332  BP: (!) 147/87  Pulse: 72  Temp: 97.7 F (36.5 C)  Weight: 192 lb (87.1 kg)    Wt Readings from Last 3 Encounters:  11/12/18 192 lb (87.1 kg)  09/23/18 193 lb 6.4 oz (87.7 kg)  09/21/18 194 lb (88 kg)     PHYSICAL EXAM General:  NAD HEENT: Normal. Neck: No JVD, no thyromegaly. Lungs: Clear to auscultation bilaterally with normal respiratory effort. CV: Regular rate and rhythm, normal S1/S2, no S3/S4, no murmur. No pretibial or periankle edema.   Abdomen: Soft, nontender, no distention.  Neurologic: Alert and oriented.  Psych: Normal affect. Skin: Normal. Musculoskeletal: No gross deformities.    ECG: Reviewed above under Subjective   Labs: Lab Results  Component Value Date/Time   K 4.0 09/21/2018 07:43 AM   BUN 20 09/21/2018 07:43 AM   BUN 23 09/19/2017 12:51 PM   CREATININE 1.07 (H) 09/21/2018 07:43 AM   CREATININE 1.05 (H) 05/04/2015 03:59 PM   ALT 29 09/23/2018 03:30 PM   TSH 1.760 09/19/2017 12:51 PM   HGB 13.3 09/21/2018 07:43 AM   HGB 14.1 09/19/2017 12:51 PM     Lipids: No results found for: LDLCALC, LDLDIRECT, CHOL, TRIG, HDL     Echocardiogram 09/19/2017:  Study Conclusions  - Left ventricle: The cavity size was normal. Wall thickness was   normal. Systolic function was normal. The estimated ejection   fraction was in the range of 60% to 65%. Wall motion was normal;   there were no regional wall motion abnormalities. Doppler   parameters are consistent with abnormal left ventricular   relaxation (grade 1 diastolic dysfunction). The E/e&' ratio is   between 8-15, suggesting indeterminate LV filling pressure. - Left atrium: Moderately dilated. - Inferior vena cava: The vessel was normal in size. The   respirophasic diameter changes were in the normal  range (>= 50%),   consistent with normal central venous pressure.  Impressions:  - Compared to a prior study in 2018, there are no significant   changes.    ASSESSMENT AND PLAN: 1.  Tachy-brady syndrome status post dual-chamber pacemaker implantation on 07/18/2017: Normal device function.  Follows with Dr. Lovena Le.   2.  Paroxysmal atrial fibrillation: Currently on atenolol. Systemically anticoagulated with Eliquis 5 mg twice  daily.  3.  Shortness of breath: No obvious cardiopulmonary abnormalities by CT in July or chest x-ray this month.  She underwent a low risk nuclear stress test on 06/23/2016. She has a strong family h/o heart disease. I am suspicious her symptoms are anginal. I will arrange for right and left heart catheterization and coronary angiography. Risks and benefits of cardiac catheterization have been discussed with the patient.  These include bleeding, infection, kidney damage, stroke, heart attack, death.  The patient understands these risks and is willing to proceed. She is on a beta blocker and statin. No ASA as she is on Eliquis.   Disposition: Follow up 1 month after cath  A high level of decision making was required for increased medical complexities.    [name], M.D., F.A.C.C.

## 2018-11-12 NOTE — Telephone Encounter (Signed)
Called patient with test results. No answer. Left message to call back.  

## 2018-11-12 NOTE — Telephone Encounter (Signed)
-----   Message from Herminio Commons, MD sent at 11/12/2018  3:58 PM EDT ----- Stable stage 3 CKD

## 2018-11-13 NOTE — Telephone Encounter (Signed)
Dr.Rehman reviewed the information sent to him by PCP . He has ask that the patient be brought in next week for him to see.

## 2018-11-13 NOTE — Progress Notes (Signed)
Remote pacemaker transmission.   

## 2018-11-14 ENCOUNTER — Telehealth: Payer: Self-pay | Admitting: *Deleted

## 2018-11-14 ENCOUNTER — Other Ambulatory Visit: Payer: Self-pay

## 2018-11-14 ENCOUNTER — Other Ambulatory Visit (HOSPITAL_COMMUNITY)
Admission: RE | Admit: 2018-11-14 | Discharge: 2018-11-14 | Disposition: A | Discharge status: Home / Self Care | Payer: Medicare Other | Source: Ambulatory Visit | Attending: Interventional Cardiology | Admitting: Interventional Cardiology

## 2018-11-14 DIAGNOSIS — Z01812 Encounter for preprocedural laboratory examination: Secondary | ICD-10-CM | POA: Diagnosis not present

## 2018-11-14 DIAGNOSIS — Z20828 Contact with and (suspected) exposure to other viral communicable diseases: Secondary | ICD-10-CM | POA: Insufficient documentation

## 2018-11-14 LAB — SARS CORONAVIRUS 2 (TAT 6-24 HRS): SARS Coronavirus 2: NEGATIVE

## 2018-11-14 NOTE — Telephone Encounter (Addendum)
Pt contacted pre-catheterization scheduled at [facility] for: Monday November 18, 2018 9 AM Verified arrival time and place: [facility]) at: 7 AM   No solid food after midnight prior to cath, clear liquids until 5 AM day of procedure.  Contrast allergy: no  Hold: Eliquis-last dose 11/15/18 until post procedure. NSAIDS  Except hold medications AM meds can be  taken pre-cath with sip of water including: ASA 81 mg   Confirm patient has responsible person to drive home post procedure and observe 24 hours after arriving home-yes  Currently, due to Covid-19 pandemic, only one support person will be allowed with patient. Must be the same support person for that patient's entire stay, will be screened and required to wear a mask. They will be asked to wait in the waiting room for the duration of the patient's stay.  Patients are required to wear a mask when they enter the hospital.      COVID-19 Pre-Screening Questions:  . In the past 7 to 10 days have you had a cough,  shortness of breath, headache, congestion, fever (100 or greater) body aches, chills, sore throat, or sudden loss of taste or sense of smell? Shortness of breath . Have you been around anyone with known Covid 19? no . Have you been around anyone who is awaiting Covid 19 test results in the past 7 to 10 days? no . Have you been around anyone who has been exposed to Covid 19, or has mentioned symptoms of Covid 19 within the past 7 to 10 days?     I reviewed procedure/mask/visitor, Covid-19 screening questions with patient, she verbalized understanding, thanked me for call.

## 2018-11-16 ENCOUNTER — Other Ambulatory Visit (INDEPENDENT_AMBULATORY_CARE_PROVIDER_SITE_OTHER): Payer: Self-pay

## 2018-11-16 DIAGNOSIS — K219 Gastro-esophageal reflux disease without esophagitis: Secondary | ICD-10-CM

## 2018-11-18 ENCOUNTER — Ambulatory Visit (HOSPITAL_COMMUNITY)
Admission: RE | Admit: 2018-11-18 | Discharge: 2018-11-18 | Disposition: A | Discharge status: Home / Self Care | Payer: Medicare Other | Attending: Interventional Cardiology | Admitting: Interventional Cardiology

## 2018-11-18 ENCOUNTER — Encounter (HOSPITAL_COMMUNITY): Payer: Self-pay | Admitting: Interventional Cardiology

## 2018-11-18 ENCOUNTER — Encounter (HOSPITAL_COMMUNITY)
Admission: RE | Disposition: A | Discharge status: Home / Self Care | Payer: Self-pay | Source: Home / Self Care | Attending: Interventional Cardiology

## 2018-11-18 ENCOUNTER — Other Ambulatory Visit: Payer: Self-pay

## 2018-11-18 DIAGNOSIS — F329 Major depressive disorder, single episode, unspecified: Secondary | ICD-10-CM | POA: Insufficient documentation

## 2018-11-18 DIAGNOSIS — E669 Obesity, unspecified: Secondary | ICD-10-CM | POA: Insufficient documentation

## 2018-11-18 DIAGNOSIS — Z6833 Body mass index (BMI) 33.0-33.9, adult: Secondary | ICD-10-CM | POA: Diagnosis not present

## 2018-11-18 DIAGNOSIS — Z79890 Hormone replacement therapy (postmenopausal): Secondary | ICD-10-CM | POA: Insufficient documentation

## 2018-11-18 DIAGNOSIS — I10 Essential (primary) hypertension: Secondary | ICD-10-CM | POA: Insufficient documentation

## 2018-11-18 DIAGNOSIS — I2510 Atherosclerotic heart disease of native coronary artery without angina pectoris: Secondary | ICD-10-CM

## 2018-11-18 DIAGNOSIS — F419 Anxiety disorder, unspecified: Secondary | ICD-10-CM | POA: Insufficient documentation

## 2018-11-18 DIAGNOSIS — I480 Paroxysmal atrial fibrillation: Secondary | ICD-10-CM | POA: Diagnosis present

## 2018-11-18 DIAGNOSIS — E039 Hypothyroidism, unspecified: Secondary | ICD-10-CM | POA: Insufficient documentation

## 2018-11-18 DIAGNOSIS — R079 Chest pain, unspecified: Secondary | ICD-10-CM | POA: Diagnosis not present

## 2018-11-18 DIAGNOSIS — I5030 Unspecified diastolic (congestive) heart failure: Secondary | ICD-10-CM | POA: Diagnosis present

## 2018-11-18 DIAGNOSIS — G7000 Myasthenia gravis without (acute) exacerbation: Secondary | ICD-10-CM | POA: Diagnosis not present

## 2018-11-18 DIAGNOSIS — E782 Mixed hyperlipidemia: Secondary | ICD-10-CM | POA: Diagnosis not present

## 2018-11-18 DIAGNOSIS — Z79899 Other long term (current) drug therapy: Secondary | ICD-10-CM | POA: Insufficient documentation

## 2018-11-18 DIAGNOSIS — I119 Hypertensive heart disease without heart failure: Secondary | ICD-10-CM | POA: Diagnosis present

## 2018-11-18 DIAGNOSIS — K219 Gastro-esophageal reflux disease without esophagitis: Secondary | ICD-10-CM | POA: Diagnosis not present

## 2018-11-18 DIAGNOSIS — R0609 Other forms of dyspnea: Secondary | ICD-10-CM | POA: Diagnosis not present

## 2018-11-18 DIAGNOSIS — Z950 Presence of cardiac pacemaker: Secondary | ICD-10-CM | POA: Diagnosis not present

## 2018-11-18 DIAGNOSIS — Z7901 Long term (current) use of anticoagulants: Secondary | ICD-10-CM | POA: Diagnosis not present

## 2018-11-18 DIAGNOSIS — K589 Irritable bowel syndrome without diarrhea: Secondary | ICD-10-CM | POA: Insufficient documentation

## 2018-11-18 DIAGNOSIS — R0602 Shortness of breath: Secondary | ICD-10-CM | POA: Insufficient documentation

## 2018-11-18 DIAGNOSIS — I5032 Chronic diastolic (congestive) heart failure: Secondary | ICD-10-CM | POA: Diagnosis present

## 2018-11-18 HISTORY — PX: RIGHT/LEFT HEART CATH AND CORONARY ANGIOGRAPHY: CATH118266

## 2018-11-18 LAB — POCT I-STAT EG7
Acid-Base Excess: 1 mmol/L (ref 0.0–2.0)
Bicarbonate: 25.3 mmol/L (ref 20.0–28.0)
Bicarbonate: 27.2 mmol/L (ref 20.0–28.0)
Bicarbonate: 26 mmol/L (ref 20.0–28.0)
Bicarbonate: 26 mmol/L (ref 20.0–28.0)
Calcium, Ion: 1.3 mmol/L (ref 1.15–1.40)
Calcium, Ion: 1.31 mmol/L (ref 1.15–1.40)
Calcium, Ion: 1.33 mmol/L (ref 1.15–1.40)
Calcium, Ion: 1.35 mmol/L (ref 1.15–1.40)
HCT: 36 % (ref 36.0–46.0)
HCT: 37 % (ref 36.0–46.0)
HCT: 36 % (ref 36.0–46.0)
HCT: 36 % (ref 36.0–46.0)
Hemoglobin: 12.2 g/dL (ref 12.0–15.0)
Hemoglobin: 12.6 g/dL (ref 12.0–15.0)
Hemoglobin: 12.2 g/dL (ref 12.0–15.0)
Hemoglobin: 12.2 g/dL (ref 12.0–15.0)
O2 Saturation: 78 %
O2 Saturation: 80 %
O2 Saturation: 81 %
O2 Saturation: 81 %
Potassium: 3.9 mmol/L (ref 3.5–5.1)
Potassium: 4 mmol/L (ref 3.5–5.1)
Potassium: 3.9 mmol/L (ref 3.5–5.1)
Potassium: 4 mmol/L (ref 3.5–5.1)
Sodium: 142 mmol/L (ref 135–145)
Sodium: 142 mmol/L (ref 135–145)
Sodium: 141 mmol/L (ref 135–145)
Sodium: 142 mmol/L (ref 135–145)
TCO2: 27 mmol/L (ref 22–32)
TCO2: 29 mmol/L (ref 22–32)
TCO2: 27 mmol/L (ref 22–32)
TCO2: 27 mmol/L (ref 22–32)
pCO2, Ven: 41.1 mmHg — ABNORMAL LOW (ref 44.0–60.0)
pCO2, Ven: 46.5 mmHg (ref 44.0–60.0)
pCO2, Ven: 47.1 mmHg (ref 44.0–60.0)
pCO2, Ven: 49.2 mmHg (ref 44.0–60.0)
pH, Ven: 7.375 (ref 7.250–7.430)
pH, Ven: 7.398 (ref 7.250–7.430)
pH, Ven: 7.33 (ref 7.250–7.430)
pH, Ven: 7.35 (ref 7.250–7.430)
pO2, Ven: 43 mmHg (ref 32.0–45.0)
pO2, Ven: 46 mmHg — ABNORMAL HIGH (ref 32.0–45.0)
pO2, Ven: 49 mmHg — ABNORMAL HIGH (ref 32.0–45.0)
pO2, Ven: 49 mmHg — ABNORMAL HIGH (ref 32.0–45.0)

## 2018-11-18 LAB — POCT I-STAT 7, (LYTES, BLD GAS, ICA,H+H)
Acid-Base Excess: 1 mmol/L (ref 0.0–2.0)
Bicarbonate: 26.4 mmol/L (ref 20.0–28.0)
Calcium, Ion: 1.32 mmol/L (ref 1.15–1.40)
HCT: 38 % (ref 36.0–46.0)
Hemoglobin: 12.9 g/dL (ref 12.0–15.0)
O2 Saturation: 100 %
Potassium: 3.9 mmol/L (ref 3.5–5.1)
Sodium: 142 mmol/L (ref 135–145)
TCO2: 28 mmol/L (ref 22–32)
pCO2 arterial: 45.2 mmHg (ref 32.0–48.0)
pH, Arterial: 7.375 (ref 7.350–7.450)
pO2, Arterial: 180 mmHg — ABNORMAL HIGH (ref 83.0–108.0)

## 2018-11-18 SURGERY — RIGHT/LEFT HEART CATH AND CORONARY ANGIOGRAPHY
Anesthesia: LOCAL

## 2018-11-18 MED ORDER — OXYCODONE HCL 5 MG PO TABS: 5.0000 mg | ORAL_TABLET | ORAL | Status: DC | PRN

## 2018-11-18 MED ORDER — LIDOCAINE HCL (PF) 1 % IJ SOLN
INTRAMUSCULAR | Status: AC
  Filled 2018-11-18: qty 30

## 2018-11-18 MED ORDER — HYDRALAZINE HCL 20 MG/ML IJ SOLN: 10.0000 mg | INTRAMUSCULAR | Status: DC | PRN

## 2018-11-18 MED ORDER — MIDAZOLAM HCL 2 MG/2ML IJ SOLN
INTRAMUSCULAR | Status: AC
  Filled 2018-11-18: qty 2

## 2018-11-18 MED ORDER — SODIUM CHLORIDE 0.9% FLUSH: 3.0000 mL | INTRAVENOUS | Status: DC | PRN

## 2018-11-18 MED ORDER — HEPARIN (PORCINE) IN NACL 1000-0.9 UT/500ML-% IV SOLN
INTRAVENOUS | Status: DC | PRN
  Administered 2018-11-18: 500 mL

## 2018-11-18 MED ORDER — SODIUM CHLORIDE 0.9 % IV SOLN: 250.0000 mL | INTRAVENOUS | Status: DC | PRN

## 2018-11-18 MED ORDER — SODIUM CHLORIDE 0.9 % WEIGHT BASED INFUSION
3.0000 mL/kg/h | INTRAVENOUS | Status: AC
  Administered 2018-11-18: 08:00:00 3 mL/kg/h via INTRAVENOUS

## 2018-11-18 MED ORDER — LABETALOL HCL 5 MG/ML IV SOLN: 10.0000 mg | INTRAVENOUS | Status: DC | PRN

## 2018-11-18 MED ORDER — FENTANYL CITRATE (PF) 100 MCG/2ML IJ SOLN
INTRAMUSCULAR | Status: DC | PRN
  Administered 2018-11-18: 25 ug via INTRAVENOUS

## 2018-11-18 MED ORDER — FENTANYL CITRATE (PF) 100 MCG/2ML IJ SOLN
INTRAMUSCULAR | Status: AC
  Filled 2018-11-18: qty 2

## 2018-11-18 MED ORDER — HEPARIN SODIUM (PORCINE) 1000 UNIT/ML IJ SOLN
INTRAMUSCULAR | Status: DC | PRN
  Administered 2018-11-18: 4000 [IU] via INTRAVENOUS

## 2018-11-18 MED ORDER — ONDANSETRON HCL 4 MG/2ML IJ SOLN: 4.0000 mg | Freq: Four times a day (QID) | INTRAMUSCULAR | Status: DC | PRN

## 2018-11-18 MED ORDER — VERAPAMIL HCL 2.5 MG/ML IV SOLN
INTRAVENOUS | Status: AC
  Filled 2018-11-18: qty 2

## 2018-11-18 MED ORDER — LIDOCAINE HCL (PF) 1 % IJ SOLN
INTRAMUSCULAR | Status: DC | PRN
  Administered 2018-11-18 (×2): 2 mL

## 2018-11-18 MED ORDER — ASPIRIN 81 MG PO CHEW: 81.0000 mg | CHEWABLE_TABLET | ORAL | Status: DC

## 2018-11-18 MED ORDER — MIDAZOLAM HCL 2 MG/2ML IJ SOLN
INTRAMUSCULAR | Status: DC | PRN
  Administered 2018-11-18: 0.5 mg via INTRAVENOUS
  Administered 2018-11-18: 1 mg via INTRAVENOUS

## 2018-11-18 MED ORDER — VERAPAMIL HCL 2.5 MG/ML IV SOLN
INTRAVENOUS | Status: DC | PRN
  Administered 2018-11-18: 8 mL via INTRA_ARTERIAL

## 2018-11-18 MED ORDER — IOHEXOL 350 MG/ML SOLN
INTRAVENOUS | Status: DC | PRN
  Administered 2018-11-18: 10:00:00 55 mL via INTRA_ARTERIAL

## 2018-11-18 MED ORDER — ACETAMINOPHEN 325 MG PO TABS: 650.0000 mg | ORAL_TABLET | ORAL | Status: DC | PRN

## 2018-11-18 MED ORDER — SODIUM CHLORIDE 0.9 % WEIGHT BASED INFUSION: 1.0000 mL/kg/h | INTRAVENOUS | Status: DC

## 2018-11-18 MED ORDER — SODIUM CHLORIDE 0.9% FLUSH: 3.0000 mL | Freq: Two times a day (BID) | INTRAVENOUS | Status: DC

## 2018-11-18 MED ORDER — SODIUM CHLORIDE 0.9 % IV SOLN: INTRAVENOUS | Status: DC

## 2018-11-18 MED ORDER — HEPARIN (PORCINE) IN NACL 1000-0.9 UT/500ML-% IV SOLN
INTRAVENOUS | Status: AC
  Filled 2018-11-18: qty 1000

## 2018-11-18 MED ORDER — HEPARIN SODIUM (PORCINE) 1000 UNIT/ML IJ SOLN
INTRAMUSCULAR | Status: AC
  Filled 2018-11-18: qty 1

## 2018-11-18 SURGICAL SUPPLY — 15 items
CATH BALLN WEDGE 5F 110CM (CATHETERS) ×1 IMPLANT
CATH INFINITI 5 FR JL3.5 (CATHETERS) ×1 IMPLANT
CATH INFINITI JR4 5F (CATHETERS) ×1 IMPLANT
DEVICE RAD COMP TR BAND LRG (VASCULAR PRODUCTS) ×1 IMPLANT
GLIDESHEATH SLEND A-KIT 6F 22G (SHEATH) ×1 IMPLANT
GUIDEWIRE .025 260CM (WIRE) ×1 IMPLANT
GUIDEWIRE INQWIRE 1.5J.035X260 (WIRE) IMPLANT
INQWIRE 1.5J .035X260CM (WIRE) ×2
KIT HEART LEFT (KITS) ×2 IMPLANT
PACK CARDIAC CATHETERIZATION (CUSTOM PROCEDURE TRAY) ×2 IMPLANT
SHEATH GLIDE SLENDER 4/5FR (SHEATH) ×1 IMPLANT
SHEATH PROBE COVER 6X72 (BAG) ×1 IMPLANT
TRANSDUCER W/STOPCOCK (MISCELLANEOUS) ×2 IMPLANT
TUBING CIL FLEX 10 FLL-RA (TUBING) ×2 IMPLANT
WIRE HI TORQ VERSACORE-J 145CM (WIRE) ×1 IMPLANT

## 2018-11-18 NOTE — CV Procedure (Signed)
   Left and right heart cath performed using right radial and right antecubital vein.  Real-time vascular ultrasound used for arterial access.  Normal LV function  Normal right heart pressures and oximetry  40% first diagonal otherwise normal coronary arteries.  No significant cardiac hemodynamic, functional, or oximetry abnormalities.  Chest pain and dyspnea are noncardiac.

## 2018-11-18 NOTE — Discharge Instructions (Signed)
Radial Site Care ° °This sheet gives you information about how to care for yourself after your procedure. Your health care provider may also give you more specific instructions. If you have problems or questions, contact your health care provider. °What can I expect after the procedure? °After the procedure, it is common to have: °· Bruising and tenderness at the catheter insertion area. °Follow these instructions at home: °Medicines °· Take over-the-counter and prescription medicines only as told by your health care provider. °Insertion site care °· Follow instructions from your health care provider about how to take care of your insertion site. Make sure you: °? Wash your hands with soap and water before you change your bandage (dressing). If soap and water are not available, use hand sanitizer. °? Change your dressing as told by your health care provider. °? Leave stitches (sutures), skin glue, or adhesive strips in place. These skin closures may need to stay in place for 2 weeks or longer. If adhesive strip edges start to loosen and curl up, you may trim the loose edges. Do not remove adhesive strips completely unless your health care provider tells you to do that. °· Check your insertion site every day for signs of infection. Check for: °? Redness, swelling, or pain. °? Fluid or blood. °? Pus or a bad smell. °? Warmth. °· Do not take baths, swim, or use a hot tub until your health care provider approves. °· You may shower 24-48 hours after the procedure, or as directed by your health care provider. °? Remove the dressing and gently wash the site with plain soap and water. °? Pat the area dry with a clean towel. °? Do not rub the site. That could cause bleeding. °· Do not apply powder or lotion to the site. °Activity ° °· For 24 hours after the procedure, or as directed by your health care provider: °? Do not flex or bend the affected arm. °? Do not push or pull heavy objects with the affected arm. °? Do not  drive yourself home from the hospital or clinic. You may drive 24 hours after the procedure unless your health care provider tells you not to. °? Do not operate machinery or power tools. °· Do not lift anything that is heavier than 10 lb (4.5 kg), or the limit that you are told, until your health care provider says that it is safe. °· Ask your health care provider when it is okay to: °? Return to work or school. °? Resume usual physical activities or sports. °? Resume sexual activity. °General instructions °· If the catheter site starts to bleed, raise your arm and put firm pressure on the site. If the bleeding does not stop, get help right away. This is a medical emergency. °· If you went home on the same day as your procedure, a responsible adult should be with you for the first 24 hours after you arrive home. °· Keep all follow-up visits as told by your health care provider. This is important. °Contact a health care provider if: °· You have a fever. °· You have redness, swelling, or yellow drainage around your insertion site. °Get help right away if: °· You have unusual pain at the radial site. °· The catheter insertion area swells very fast. °· The insertion area is bleeding, and the bleeding does not stop when you hold steady pressure on the area. °· Your arm or hand becomes pale, cool, tingly, or numb. °These symptoms may represent a serious problem   that is an emergency. Do not wait to see if the symptoms will go away. Get medical help right away. Call your local emergency services (911 in the [place] Do not drive yourself to the hospital. °Summary °· After the procedure, it is common to have bruising and tenderness at the site. °· Follow instructions from your health care provider about how to take care of your radial site wound. Check the wound every day for signs of infection. °· Do not lift anything that is heavier than 10 lb (4.5 kg), or the limit that you are told, until your health care provider says  that it is safe. °This information is not intended to replace advice given to you by your health care provider. Make sure you discuss any questions you have with your health care provider. °Document Released: 04/08/2010 Document Revised: 04/11/2017 Document Reviewed: 04/11/2017 °Elsevier Patient Education © 2020 [employer] ° °

## 2018-11-18 NOTE — Interval H&P Note (Signed)
Cath Lab Visit (complete for each Cath Lab visit)  Clinical Evaluation Leading to the Procedure:   ACS: No.  Non-ACS:    Anginal Classification: CCS III  Anti-ischemic medical therapy: Minimal Therapy (1 class of medications)  Non-Invasive Test Results: Intermediate-risk stress test findings: cardiac mortality 1-3%/year  Prior CABG: No previous CABG      History and Physical Interval Note:  11/18/2018 8:23 AM  [name]  has presented today for surgery, with the diagnosis of Angina.  The various methods of treatment have been discussed with the patient and family. After consideration of risks, benefits and other options for treatment, the patient has consented to  Procedure(s): RIGHT/LEFT HEART CATH AND CORONARY ANGIOGRAPHY (N/A) as a surgical intervention.  The patient's history has been reviewed, patient examined, no change in status, stable for surgery.  I have reviewed the patient's chart and labs.  Questions were answered to the patient's satisfaction.     Belva Crome III

## 2018-11-18 NOTE — Progress Notes (Signed)
Chaplain received request to check on daughter of Neha, Waldridge Margaret Pyle, [phone]), who was waiting in the admissions area for [facility] as she was in the cathlab. Manuelita's granddaughter, Susan's Daughter, Janett Billow is a Marine scientist in [place].  Chaplain located [name], and affirmed that she was doing okay emotionally. Chaplain asked if there was anything she could do for [place]. Prayer was requested. A prayer was said. Chaplain offered further services at the request of the family.   Family Spiritual Connection = [facility]  Chaplain Resident, Evelene Croon, M.Div. Pager # [phone]

## 2018-11-19 ENCOUNTER — Encounter (INDEPENDENT_AMBULATORY_CARE_PROVIDER_SITE_OTHER): Payer: Self-pay | Admitting: Internal Medicine

## 2018-11-19 ENCOUNTER — Ambulatory Visit (INDEPENDENT_AMBULATORY_CARE_PROVIDER_SITE_OTHER): Payer: Medicare Other | Admitting: Internal Medicine

## 2018-11-19 VITALS — BP 114/77 | HR 66 | Temp 98.4°F | Ht 63.0 in | Wt 191.0 lb

## 2018-11-19 DIAGNOSIS — K838 Other specified diseases of biliary tract: Secondary | ICD-10-CM

## 2018-11-19 DIAGNOSIS — K219 Gastro-esophageal reflux disease without esophagitis: Secondary | ICD-10-CM | POA: Diagnosis not present

## 2018-11-19 NOTE — Progress Notes (Signed)
Presenting complaint;  Follow-up for chest pain and dilated bile duct.  Database and subjective:  [name] is 78 year old Caucasian female with multiple medical problems including chronic GERD who was last seen on 09/23/2018 2 days after her visit to emergency room for chest pain.  Work-up in emergency room on September 21, 2018 was negative for cardiac etiology.  She also had CT angios chest and abdomen pelvis which ruled out aortic dissection.  Her bile duct was noted to be dilated measuring 17 mm.  Her LFTs were normal.  On her last visit patient reported that she tends to get chest pain in the evening and she was not having any oth er associated symptoms.  I recommended taking 60 mg of lansoprazole before evening meal.  However he did not make any difference. Abdominal ultrasound was obtained on 09/24/2018.  On the study bile duct measured 13 mm without evidence of choledocholithiasis.  She has continued experience retrosternal pain which radiates into left shoulder back as well as left side of the chest.  She had cardiac cath yesterday and noted to have noncritical disease.  Was felt her chest pain was not due to angina.  Patient has been using pain medication more often and it seems to help her pain.  She says she has been using oxycodone for arthritis as well.  She also has back pain and shoulder pain and uses Lidoderm 3-4 times every week. She denies nausea vomiting or vomiting.  She has dysphagia only with large pills.  She does not have a good appetite.  Her weight is only down by 2 pounds since her last visit 8 weeks ago. Her other major complaint is weakness and exertional dyspnea.  She states she has no difficulty breathing at rest but she gets short of breath with minimal exertion.  She does not have chest pain when she has shortness of breath.  Patient has myasthenia gravis and under care of Dr. Floyde Parkins.  She says her medication dose was reduced recently but she cannot tell any difference as her  weakness and breathing difficulty. She also complains of nausea but has not had any vomiting.  Her bowels move daily and she denies melena or rectal bleeding. She had repeat blood work by Dr. Nevada Crane on 11/06/2018. Patient is on anticoagulant for paroxysmal atrial fibrillation.   Current Medications: Outpatient Encounter Medications as of 11/19/2018  Medication Sig  . ALPRAZolam (XANAX) 1 MG tablet Take 1 mg by mouth at bedtime.   . Artificial Tear Solution (SOOTHE XP OP) Place 1 drop into both eyes 4 (four) times daily.  Marland Kitchen atenolol (TENORMIN) 25 MG tablet Take one tablet by mouth daily.  May take an additional tablet for systolic 99991111 or severe palpitations. (Patient taking differently: Take 25 mg by mouth See admin instructions. Take one tablet (25 mg) scheduled by mouth daily in the morning.  May take an additional tablet for systolic 99991111 or severe palpitations.)  . B Complex-C (B-COMPLEX WITH VITAMIN C) tablet Take 1 tablet by mouth daily with supper.  Marland Kitchen buPROPion (WELLBUTRIN SR) 150 MG 12 hr tablet Take 150 mg by mouth daily.  . cholecalciferol (VITAMIN D) 1000 units tablet Take 1,000 Units by mouth 2 (two) times a day.   . Coenzyme Q10 (COQ10) 100 MG CAPS Take 100 mg by mouth daily.  . Cyanocobalamin (VITAMIN B-12) 3000 MCG SUBL Take 3,000 mcg by mouth 2 (two) times daily.  . diclofenac sodium (VOLTAREN) 1 % GEL Apply 4 g topically 4 (  four) times daily as needed (for pain).   Marland Kitchen ELIQUIS 5 MG TABS tablet TAKE (1) TABLET BY MOUTH TWICE DAILY. (Patient taking differently: Take 5 mg by mouth 2 (two) times daily. )  . ketoconazole (NIZORAL) 2 % cream Apply 1 application topically 2 (two) times daily. applied under breasts  . lansoprazole (PREVACID) 30 MG capsule TAKE (1) CAPSULE BY MOUTH TWICE DAILY. (Patient taking differently: Take 60 mg by mouth daily before breakfast. )  . levothyroxine (SYNTHROID) 112 MCG tablet Take 112 mcg by mouth daily before breakfast.  . lidocaine (LIDODERM) 5 % Place 1-3  patches onto the skin daily as needed (for pain). Remove & Discard patch within 12 hours or as directed by MD for pain  . Magnesium 250 MG TABS Take 500 mg by mouth every evening.   Marland Kitchen oxyCODONE-acetaminophen (PERCOCET) 10-325 MG tablet Take 1 tablet by mouth every 6 (six) hours as needed (pain).   . pravastatin (PRAVACHOL) 80 MG tablet Take 80 mg by mouth daily with supper.   . pyridostigmine (MESTINON) 60 MG tablet Take 0.5 tablets (30 mg total) by mouth 4 (four) times daily.  . SUMAtriptan (IMITREX) 50 MG tablet Take 50 mg by mouth every 2 (two) hours as needed. For migraines  . Terbinafine 1 % GEL Apply 1 application topically 2 (two) times daily as needed (toe nail fungus).   . TURMERIC PO Take 1,000 mg by mouth daily with supper.   Facility-Administered Encounter Medications as of 11/19/2018  Medication  . sodium chloride flush (NS) 0.9 % injection 3 mL     Objective: Blood pressure 114/77, pulse 66, temperature 98.4 F (36.9 C), temperature source Oral, height 5\' 3"  (1.6 m), weight 191 lb (86.6 kg). Patient is alert and in no acute distress. She has somewhat flat facies. Conjunctiva is pink. Sclera is nonicteric Oropharyngeal mucosa is normal. No neck masses or thyromegaly noted. She does not have sternal tenderness but she is tender over both costal margins left greater than right. Cardiac exam with regular rhythm normal S1 and S2. No murmur or gallop noted. Lungs are clear to auscultation. Abdomen is symmetrical with lower midline and small right subcostal scars.  On palpation is soft and nontender with organomegaly or masses. No LE edema or clubbing noted. Patient required assistance to step up to examination table.  Labs/studies Results: Lab data from 11/06/2018 WBC is 8.1 H&H 13.6 and 41.9. Platelet count is 172K. Glucose 88 BUN 16 and creatinine 1.22. Serum sodium 140, potassium 5.0, chloride 102, CO2 24 Serum calcium 9.8. Bilirubin 0.4, AP 82, AST 15, ALT 7, total  protein 6.6 and albumin 3.8.   Assessment:  #1.  Chest pain.  Chest pain appears to be musculoskeletal pain.  Pain does not appear to be due to GERD or cardiac disease.  I also do not believe that chest pain is due to biliary colic.  One would expect bump in her transaminases with significant pain.  #2.  Dilated bile duct.  She has had dilated bile duct for more than 2 years.  Dilated bile duct is either idiopathic or due to narcotic therapy.  Doubt that she has choledocholithiasis but with ongoing symptoms this needs to be ruled out.  #3.  GERD.  Typical symptoms are well controlled with therapy.  She can go back to taking lansoprazole 30 mg twice daily.  #4.  Weakness and exertional dyspnea.  While she may benefit from pulmonary consultation I am concerned that the symptoms are due to myasthenia  gravis.  Plan:  Change lansoprazole schedule back to 30 mg p.o. twice daily.  Take it before breakfast and evening meals. Will schedule MRCP. Will contact patient and or or her daughter [name] with results when completed.

## 2018-11-19 NOTE — Patient Instructions (Signed)
Physician will call with results of MRCP when completed.

## 2018-11-22 ENCOUNTER — Encounter (HOSPITAL_COMMUNITY): Payer: Self-pay

## 2018-11-22 ENCOUNTER — Other Ambulatory Visit: Payer: Self-pay

## 2018-11-22 ENCOUNTER — Ambulatory Visit (HOSPITAL_COMMUNITY)
Admission: RE | Admit: 2018-11-22 | Discharge: 2018-11-22 | Disposition: A | Discharge status: Home / Self Care | Payer: Medicare Other | Source: Ambulatory Visit | Attending: Internal Medicine | Admitting: Internal Medicine

## 2018-11-22 ENCOUNTER — Other Ambulatory Visit (INDEPENDENT_AMBULATORY_CARE_PROVIDER_SITE_OTHER): Payer: Self-pay | Admitting: Internal Medicine

## 2018-11-22 DIAGNOSIS — Z950 Presence of cardiac pacemaker: Secondary | ICD-10-CM | POA: Diagnosis not present

## 2018-11-22 DIAGNOSIS — Z79891 Long term (current) use of opiate analgesic: Secondary | ICD-10-CM | POA: Diagnosis not present

## 2018-11-22 DIAGNOSIS — K219 Gastro-esophageal reflux disease without esophagitis: Secondary | ICD-10-CM | POA: Diagnosis not present

## 2018-11-22 DIAGNOSIS — G894 Chronic pain syndrome: Secondary | ICD-10-CM | POA: Diagnosis not present

## 2018-11-22 DIAGNOSIS — F331 Major depressive disorder, recurrent, moderate: Secondary | ICD-10-CM | POA: Diagnosis not present

## 2018-11-22 DIAGNOSIS — G7000 Myasthenia gravis without (acute) exacerbation: Secondary | ICD-10-CM | POA: Diagnosis not present

## 2018-11-22 DIAGNOSIS — E782 Mixed hyperlipidemia: Secondary | ICD-10-CM | POA: Diagnosis not present

## 2018-11-22 DIAGNOSIS — E039 Hypothyroidism, unspecified: Secondary | ICD-10-CM | POA: Diagnosis not present

## 2018-11-22 DIAGNOSIS — I129 Hypertensive chronic kidney disease with stage 1 through stage 4 chronic kidney disease, or unspecified chronic kidney disease: Secondary | ICD-10-CM | POA: Diagnosis not present

## 2018-11-22 DIAGNOSIS — G4700 Insomnia, unspecified: Secondary | ICD-10-CM | POA: Diagnosis not present

## 2018-11-22 DIAGNOSIS — K838 Other specified diseases of biliary tract: Secondary | ICD-10-CM

## 2018-11-22 DIAGNOSIS — I482 Chronic atrial fibrillation, unspecified: Secondary | ICD-10-CM | POA: Diagnosis not present

## 2018-11-22 DIAGNOSIS — N183 Chronic kidney disease, stage 3 (moderate): Secondary | ICD-10-CM | POA: Diagnosis not present

## 2018-11-26 ENCOUNTER — Other Ambulatory Visit (INDEPENDENT_AMBULATORY_CARE_PROVIDER_SITE_OTHER): Payer: Self-pay | Admitting: *Deleted

## 2018-11-26 DIAGNOSIS — R0789 Other chest pain: Secondary | ICD-10-CM

## 2018-11-26 DIAGNOSIS — K838 Other specified diseases of biliary tract: Secondary | ICD-10-CM

## 2018-11-27 ENCOUNTER — Other Ambulatory Visit: Payer: Self-pay | Admitting: Neurology

## 2018-11-28 ENCOUNTER — Ambulatory Visit (INDEPENDENT_AMBULATORY_CARE_PROVIDER_SITE_OTHER): Payer: Medicare Other | Admitting: Neurology

## 2018-11-28 ENCOUNTER — Encounter: Payer: Self-pay | Admitting: Neurology

## 2018-11-28 ENCOUNTER — Telehealth: Payer: Self-pay | Admitting: *Deleted

## 2018-11-28 ENCOUNTER — Other Ambulatory Visit: Payer: Self-pay

## 2018-11-28 VITALS — BP 114/78 | HR 65 | Temp 98.2°F | Ht 63.0 in | Wt 191.3 lb

## 2018-11-28 DIAGNOSIS — G7000 Myasthenia gravis without (acute) exacerbation: Secondary | ICD-10-CM

## 2018-11-28 DIAGNOSIS — Z5181 Encounter for therapeutic drug level monitoring: Secondary | ICD-10-CM

## 2018-11-28 MED ORDER — MYCOPHENOLATE MOFETIL 250 MG PO CAPS: 250.0000 mg | ORAL_CAPSULE | Freq: Two times a day (BID) | ORAL | 5 refills | Status: DC

## 2018-11-28 NOTE — Telephone Encounter (Signed)
Submitted PA mycophenolate mofetil 250mg  cap on CMM. KeyLavell Islam - PA Case ID: [id number] - Rx #: [id number]. Waiting on determination from [facility].

## 2018-11-28 NOTE — Progress Notes (Signed)
Reason for visit: Myasthenia gravis  [name] is an 78 y.o. female  History of present illness:  [name] is an 78 year old right-handed white female with a history of myasthenia gravis primarily with ocular features.  She has had myasthenia for over 20 years, she has been relatively stable just on Mestinon.  The patient however has had some issues since around 21 September 2018.  The patient has been under a lot of stress, she has lost 2 of her children with heart disease.  Most recently her child died in 08-02-18.  The patient has had onset of shortness of breath and chest pain, she went to the emergency room and underwent a CT angiogram of the chest that was unremarkable.  There was some question of a pleural effusion but this was not documented on ultrasound of the chest.  She has remained short of breath, she reports increased weakness and generally including the arms and legs with increased double vision.  She does not operate a motor vehicle.  She uses a cane to get around but she has not had any falls.  She may feel short winded even with rest.  She denies any issues swallowing food, the large pills may give her problems.  She comes back here for further evaluation.  She remains on Mestinon taking the 60 mg tablet, 1/2 tablet 4 times daily.  Past Medical History:  Diagnosis Date  . Anemia   . Chronic back pain   . Chronic nausea   . Complication of anesthesia    Low blood pressure, heart rate, O2 sat  . Depression with anxiety   . Depression with anxiety   . Diverticulosis   . Dyspnea   . Essential hypertension   . GERD (gastroesophageal reflux disease)   . History of pneumonia   . Hypothyroidism   . IBS (irritable colon syndrome)   . LVH (left ventricular hypertrophy)   . Migraines   . Mixed hyperlipidemia   . Obesity   . Ocular myasthenia gravis ([place])   . Osteoarthritis of knee   . PAF (paroxysmal atrial fibrillation) ([facility])    chads2vasc score is at least 4  .  Paraganglioma ([place])    Resection 02/2012 (retroperitoneal)  . Paraganglioma, malignant ([place] [place]) 11/06/2015  . Presence of permanent cardiac pacemaker 07/18/2017  . Renal insufficiency   . Skin cancer     Past Surgical History:  Procedure Laterality Date  . ABDOMINAL HYSTERECTOMY    . ABDOMINAL HYSTERECTOMY    . CARDIAC CATHETERIZATION    . CHOLECYSTECTOMY    . COLONOSCOPY  08/24/2011   Procedure: COLONOSCOPY;  Surgeon: Rogene Houston, MD;  Location: [facility];  Service: Endoscopy;  Laterality: N/A;  1200  . CT guided biopsy  01/16/12  . GIVENS CAPSULE STUDY  01/29/2012   Procedure: GIVENS CAPSULE STUDY;  Surgeon: Rogene Houston, MD;  Location: [facility];  Service: Endoscopy;  Laterality: N/A;  730  . KNEE SURGERY    . LAPAROTOMY  03/01/2012   Procedure: EXPLORATORY LAPAROTOMY;  Surgeon: Earnstine Regal, MD;  Location: [facility];  Service: General;  Laterality: N/A;  Exploratory Laparotomy ,Resection Retroperitoneal Mass  . NASAL SINUS SURGERY     30 yrs ago  . PACEMAKER IMPLANT N/A 07/18/2017   Procedure: PACEMAKER IMPLANT;  Surgeon: Evans Lance, MD;  Location: [facility];  Service: Cardiovascular;  Laterality: N/A;  . RIGHT/LEFT HEART CATH AND CORONARY ANGIOGRAPHY N/A 11/18/2018  Procedure: RIGHT/LEFT HEART CATH AND CORONARY ANGIOGRAPHY;  Surgeon: Belva Crome, MD;  Location: [facility];  Service: Cardiovascular;  Laterality: N/A;  . TOTAL KNEE ARTHROPLASTY Bilateral    2005 and 2011    Family History  Problem Relation Age of Onset  . Inflammatory bowel disease Maternal Grandmother     Social history:  reports that she has never smoked. She has never used smokeless tobacco. She reports that she does not drink alcohol or use drugs.    Allergies  Allergen Reactions  . Nitrofurantoin Macrocrystal Nausea And Vomiting  . Ampicillin Rash    Did it involve swelling of the face/tongue/throat, SOB, or low BP? No Did it involve sudden or severe rash/hives, skin  peeling, or any reaction on the inside of your mouth or nose? No Did you need to seek medical attention at a hospital or doctor's office? No When did it last happen?20 years ago If all above answers are "NO", may proceed with cephalosporin use.   Marland Kitchen Avocado Diarrhea and Nausea And Vomiting  . Biaxin [Clarithromycin] Rash  . Doxycycline Monohydrate Nausea And Vomiting  . Moxifloxacin     Contraindication myasthenia gravis  . Tetracyclines & Related Nausea And Vomiting    Medications:  Prior to Admission medications   Medication Sig Start Date End Date Taking? Authorizing Provider  ALPRAZolam Duanne Moron) 1 MG tablet Take 1 mg by mouth at bedtime.     [provider]  Artificial Tear Solution (SOOTHE XP OP) Place 1 drop into both eyes 4 (four) times daily.    [provider]  atenolol (TENORMIN) 25 MG tablet Take one tablet by mouth daily.  May take an additional tablet for systolic 99991111 or severe palpitations. Patient taking differently: Take 25 mg by mouth See admin instructions. Take one tablet (25 mg) scheduled by mouth daily in the morning.  May take an additional tablet for systolic 99991111 or severe palpitations. 02/18/18   Evans Lance, MD  B Complex-C (B-COMPLEX WITH VITAMIN C) tablet Take 1 tablet by mouth daily with supper.    [provider]  buPROPion (WELLBUTRIN SR) 150 MG 12 hr tablet Take 150 mg by mouth daily. 10/02/18   [provider]  cholecalciferol (VITAMIN D) 1000 units tablet Take 1,000 Units by mouth 2 (two) times a day.     [provider]  Coenzyme Q10 (COQ10) 100 MG CAPS Take 100 mg by mouth daily.    [provider]  Cyanocobalamin (VITAMIN B-12) 3000 MCG SUBL Take 3,000 mcg by mouth 2 (two) times daily.    [provider]  diclofenac sodium (VOLTAREN) 1 % GEL Apply 4 g topically 4 (four) times daily as needed (for pain).  01/13/15   [provider]  ELIQUIS 5 MG TABS tablet TAKE (1) TABLET BY  MOUTH TWICE DAILY. Patient taking differently: Take 5 mg by mouth 2 (two) times daily.  08/13/18   Evans Lance, MD  ketoconazole (NIZORAL) 2 % cream Apply 1 application topically 2 (two) times daily. applied under breasts 06/17/18   [provider]  lansoprazole (PREVACID) 30 MG capsule TAKE (1) CAPSULE BY MOUTH TWICE DAILY. 11/19/18   Rogene Houston, MD  levothyroxine (SYNTHROID) 112 MCG tablet Take 112 mcg by mouth daily before breakfast.    [provider]  lidocaine (LIDODERM) 5 % Place 1-3 patches onto the skin daily as needed (for pain). Remove & Discard patch within 12 hours or as directed by MD for  pain    [provider]  Magnesium 250 MG TABS Take 500 mg by mouth every evening.     [provider]  oxyCODONE-acetaminophen (PERCOCET) 10-325 MG tablet Take 1 tablet by mouth every 6 (six) hours as needed (pain).  05/14/18   [provider]  pravastatin (PRAVACHOL) 80 MG tablet Take 80 mg by mouth daily with supper.     [provider]  pyridostigmine (MESTINON) 60 MG tablet TAKE (1/2) TABLET BY MOUTH FOUR TIMES DAILY. 11/27/18   Kathrynn Ducking, MD  SUMAtriptan (IMITREX) 50 MG tablet Take 50 mg by mouth every 2 (two) hours as needed. For migraines    [provider]  Terbinafine 1 % GEL Apply 1 application topically 2 (two) times daily as needed (toe nail fungus).     [provider]  TURMERIC PO Take 1,000 mg by mouth daily with supper.    [provider]    ROS:  Out of a complete 14 system review of symptoms, the patient complains only of the following symptoms, and all other reviewed systems are negative.  Shortness of breath Chest pain Weakness  Blood pressure 114/78, pulse 65, temperature 98.2 F (36.8 C), temperature source Temporal, height 5\' 3"  (1.6 m), weight 191 lb 5 oz (86.8 kg), SpO2 96 %.  Physical Exam  General: The patient is alert and cooperative at the time of the examination.   Skin: No significant peripheral edema is noted.   Neurologic Exam  Mental status: The patient is alert and oriented x 3 at the time of the examination. The patient has apparent normal recent and remote memory, with an apparently normal attention span and concentration ability.   Cranial nerves: Facial symmetry is present. Speech is normal, no aphasia or dysarthria is noted. Extraocular movements are full. Visual fields are full.  With superior gaze for 1 minute, the patient immediately notes double vision, no increased ptosis is seen.  Motor: The patient has good strength in all 4 extremities, with exception of 4/5 strength with hip flexion bilaterally.  With arms outstretched for 1 minute, no significant fatigable weakness of the deltoid muscles was noted.  Sensory examination: Soft touch sensation is symmetric on the face, arms, and legs.  Coordination: The patient has good finger-nose-finger and heel-to-shin bilaterally.  Gait and station: The patient has a slightly wide-based gait, the patient uses a cane for ambulation.  She is able to arise from a seated position with arms crossed with some difficulty.  Romberg is negative.  Reflexes: Deep tendon reflexes are symmetric.   Assessment/Plan:  1.  Myasthenia gravis  2.  Reports of chest pain, shortness of breath  The clinical examination does show some proximal weakness in both legs, the fatigable weakness of the deltoid muscle seems to be somewhat better on clinical examination today than was seen previously.  She is reporting increased double vision.  She is not on any disease modifying agents currently.  She will remain on the Mestinon on a scheduled basis, will add low-dose CellCept.  We will check blood work today, the patient is on a cholesterol-lowering agent.  We will follow-up in 3 to 4 months.  It is possible that the stress from the death of her children may be an additive factor in her current symptoms.  Jill Alexanders MD  11/28/2018 11:41 AM  [facility] [address] [place], [place] [address]  Phone [phone] Fax [phone]

## 2018-11-28 NOTE — Telephone Encounter (Addendum)
CaseId:[id number];Status:Approved;Review Type:Prior Auth;Coverage Start Date:10/29/2018;Coverage End Date:11/27/2021;  Faxed notice of approval to [place] at [phone]. Received fax confirmation.

## 2018-11-28 NOTE — Patient Instructions (Signed)
We will start Cellcept for the myasthenia.

## 2018-11-29 LAB — COMPREHENSIVE METABOLIC PANEL
ALT: 10 IU/L (ref 0–32)
AST: 12 IU/L (ref 0–40)
Albumin/Globulin Ratio: 1.3 (ref 1.2–2.2)
Albumin: 4.1 g/dL (ref 3.7–4.7)
Alkaline Phosphatase: 78 IU/L (ref 39–117)
BUN/Creatinine Ratio: 12 (ref 12–28)
BUN: 15 mg/dL (ref 8–27)
Bilirubin Total: 0.4 mg/dL (ref 0.0–1.2)
CO2: 23 mmol/L (ref 20–29)
Calcium: 9.8 mg/dL (ref 8.7–10.3)
Chloride: 104 mmol/L (ref 96–106)
Creatinine, Ser: 1.21 mg/dL — ABNORMAL HIGH (ref 0.57–1.00)
GFR calc Af Amer: 49 mL/min/{1.73_m2} — ABNORMAL LOW (ref >59)
GFR calc non Af Amer: 42 mL/min/{1.73_m2} — ABNORMAL LOW (ref >59)
Globulin, Total: 3.1 g/dL (ref 1.5–4.5)
Glucose: 92 mg/dL (ref 65–99)
Potassium: 4.6 mmol/L (ref 3.5–5.2)
Sodium: 139 mmol/L (ref 134–144)
Total Protein: 7.2 g/dL (ref 6.0–8.5)

## 2018-11-29 LAB — CBC WITH DIFFERENTIAL/PLATELET
Basophils Absolute: 0.1 10*3/uL (ref 0.0–0.2)
Basos: 1 %
EOS (ABSOLUTE): 0.1 10*3/uL (ref 0.0–0.4)
Eos: 1 %
Hematocrit: 42.2 % (ref 34.0–46.6)
Hemoglobin: 13.9 g/dL (ref 11.1–15.9)
Immature Grans (Abs): 0 10*3/uL (ref 0.0–0.1)
Immature Granulocytes: 0 %
Lymphocytes Absolute: 2.3 10*3/uL (ref 0.7–3.1)
Lymphs: 29 %
MCH: 32.5 pg (ref 26.6–33.0)
MCHC: 32.9 g/dL (ref 31.5–35.7)
MCV: 99 fL — ABNORMAL HIGH (ref 79–97)
Monocytes Absolute: 0.7 10*3/uL (ref 0.1–0.9)
Monocytes: 10 %
Neutrophils Absolute: 4.6 10*3/uL (ref 1.4–7.0)
Neutrophils: 59 %
Platelets: 203 10*3/uL (ref 150–450)
RBC: 4.28 x10E6/uL (ref 3.77–5.28)
RDW: 13.3 % (ref 11.7–15.4)
WBC: 7.8 10*3/uL (ref 3.4–10.8)

## 2018-11-29 LAB — SEDIMENTATION RATE: Sed Rate: 53 mm/hr — ABNORMAL HIGH (ref 0–40)

## 2018-11-29 LAB — CK: Total CK: 28 U/L — ABNORMAL LOW (ref 32–182)

## 2018-12-02 ENCOUNTER — Telehealth: Payer: Self-pay

## 2018-12-02 NOTE — Telephone Encounter (Signed)
Mycophenolate 250mg  has been approved. Pa approved on 10/19/2018-11/27/21 Case ID [id number].

## 2018-12-09 DIAGNOSIS — E039 Hypothyroidism, unspecified: Secondary | ICD-10-CM | POA: Diagnosis not present

## 2018-12-09 DIAGNOSIS — E041 Nontoxic single thyroid nodule: Secondary | ICD-10-CM | POA: Diagnosis not present

## 2018-12-12 ENCOUNTER — Other Ambulatory Visit (HOSPITAL_COMMUNITY): Payer: Self-pay | Admitting: Surgery

## 2018-12-12 DIAGNOSIS — E041 Nontoxic single thyroid nodule: Secondary | ICD-10-CM

## 2018-12-12 DIAGNOSIS — E039 Hypothyroidism, unspecified: Secondary | ICD-10-CM

## 2018-12-13 ENCOUNTER — Encounter: Payer: Self-pay | Admitting: Student

## 2018-12-13 ENCOUNTER — Ambulatory Visit (INDEPENDENT_AMBULATORY_CARE_PROVIDER_SITE_OTHER): Payer: Medicare Other | Admitting: Student

## 2018-12-13 ENCOUNTER — Other Ambulatory Visit: Payer: Self-pay

## 2018-12-13 VITALS — BP 148/73 | HR 73 | Temp 97.3°F | Ht 63.0 in | Wt 190.0 lb

## 2018-12-13 DIAGNOSIS — I480 Paroxysmal atrial fibrillation: Secondary | ICD-10-CM

## 2018-12-13 DIAGNOSIS — I495 Sick sinus syndrome: Secondary | ICD-10-CM

## 2018-12-13 DIAGNOSIS — I10 Essential (primary) hypertension: Secondary | ICD-10-CM

## 2018-12-13 DIAGNOSIS — R0781 Pleurodynia: Secondary | ICD-10-CM

## 2018-12-13 NOTE — Patient Instructions (Signed)
Medication Instructions: Your physician recommends that you continue on your current medications as directed. Please refer to the Current Medication list given to you today.   Labwork: None today  Procedures/Testing: None today  Follow-Up:  6 months in office with Dr.Taylor  Any Additional Special Instructions Will Be Listed Below (If Applicable).     If you need a refill on your cardiac medications before your next appointment, please call your pharmacy.     Thank you for choosing [facility] !

## 2018-12-13 NOTE — Progress Notes (Signed)
Cardiology Office Note    Date:  12/14/2018   ID:  [name], DOB 09-11-1940, MRN [id number]  PCP:  Pamela Squibb, MD  Cardiologist: Pamela Sable, MD   EP: Dr. Lovena Nolan  Chief Complaint  Patient presents with  . Follow-up    s/p cardiac catheterization    History of Present Illness:    [name] is a 78 y.o. female with past medical history of paroxysmal atrial fibrillation (on Eliquis), tachy-brady syndrome (s/p Medtronic PPM placement in 06/2017), HTN, paraganglioma, and myasthenia gravis who presents to the office today for follow-up of her recent cardiac catheterization.  She was last examined by Dr. Bronson Nolan in 10/2018 and reported exertional chest pain and dyspnea on exertion.  Reported previously been able to walk 25 laps around her driveway but was only able to walk 5 laps at that time. Given this along with her cardiac risk factors including family history of CAD, a cardiac catheterization was recommended for definitive evaluation. This was performed by Dr. Tamala Nolan on 11/18/2018 and showed normal LM, RCA, LCx and LAD with first diagonal containing 40% stenosis.  LV function and hemodynamics were normal with normal pulmonary artery pressures as well. Symptoms were thought to not be consistent with a cardiac origin.  In talking with the patient today, she reports having baseline dyspnea on exertion but denies any associated exertional chest pain. She does describe an aching sensation along the center of her chest which is typically worse with coughing. Says that this has significantly improved since her effusions have resolved.  No association of her pain with exertion. Sometimes worse with inspiration. She denies any recent orthopnea, PND, or lower extremity edema. No palpitations, dizziness, or presyncope.    Past Medical History:  Diagnosis Date  . Anemia   . Chronic back pain   . Chronic nausea   . Complication of anesthesia    Low blood pressure, heart rate, O2  sat  . Depression with anxiety   . Depression with anxiety   . Diverticulosis   . Dyspnea   . Essential hypertension   . GERD (gastroesophageal reflux disease)   . History of pneumonia   . Hypothyroidism   . IBS (irritable colon syndrome)   . LVH (left ventricular hypertrophy)   . Migraines   . Mixed hyperlipidemia   . Obesity   . Ocular myasthenia gravis ([name])   . Osteoarthritis of knee   . PAF (paroxysmal atrial fibrillation) (HCC)    chads2vasc score is at least 4  . Paraganglioma ([name])    Resection 02/2012 (retroperitoneal)  . Paraganglioma, malignant ([name]) 11/06/2015  . Presence of permanent cardiac pacemaker 07/18/2017  . Renal insufficiency   . Skin cancer     Past Surgical History:  Procedure Laterality Date  . ABDOMINAL HYSTERECTOMY    . ABDOMINAL HYSTERECTOMY    . CARDIAC CATHETERIZATION    . CHOLECYSTECTOMY    . COLONOSCOPY  08/24/2011   Procedure: COLONOSCOPY;  Surgeon: Pamela Houston, MD;  Location: [facility];  Service: Endoscopy;  Laterality: N/A;  1200  . CT guided biopsy  01/16/12  . GIVENS CAPSULE STUDY  01/29/2012   Procedure: GIVENS CAPSULE STUDY;  Surgeon: Pamela Houston, MD;  Location: [facility];  Service: Endoscopy;  Laterality: N/A;  730  . KNEE SURGERY    . LAPAROTOMY  03/01/2012   Procedure: EXPLORATORY LAPAROTOMY;  Surgeon: Pamela Regal, MD;  Location: [facility];  Service: General;  Laterality: N/A;  Exploratory Laparotomy ,Resection Retroperitoneal Mass  . NASAL SINUS SURGERY     30 yrs ago  . PACEMAKER IMPLANT N/A 07/18/2017   Procedure: PACEMAKER IMPLANT;  Surgeon: Pamela Lance, MD;  Location: [facility];  Service: Cardiovascular;  Laterality: N/A;  . RIGHT/LEFT HEART CATH AND CORONARY ANGIOGRAPHY N/A 11/18/2018   Procedure: RIGHT/LEFT HEART CATH AND CORONARY ANGIOGRAPHY;  Surgeon: Pamela Crome, MD;  Location: [facility];  Service: Cardiovascular;  Laterality: N/A;  . TOTAL KNEE ARTHROPLASTY Bilateral    2005 and 2011     Current Medications: Outpatient Medications Prior to Visit  Medication Sig Dispense Refill  . ALPRAZolam (XANAX) 1 MG tablet Take 1 mg by mouth 2 (two) times daily as needed.     . Artificial Tear Solution (SOOTHE XP OP) Place 1 drop into both eyes 4 (four) times daily.    Marland Kitchen atenolol (TENORMIN) 25 MG tablet Take one tablet by mouth daily.  May take an additional tablet for systolic 99991111 or severe palpitations. (Patient taking differently: Take 25 mg by mouth See admin instructions. Take one tablet (25 mg) scheduled by mouth daily in the morning.  May take an additional tablet for systolic 99991111 or severe palpitations.) 180 tablet 3  . B Complex-C (B-COMPLEX WITH VITAMIN C) tablet Take 1 tablet by mouth daily with supper.    Marland Kitchen buPROPion (WELLBUTRIN SR) 150 MG 12 hr tablet Take 150 mg by mouth daily.    . cholecalciferol (VITAMIN D) 1000 units tablet Take 1,000 Units by mouth 2 (two) times a day.     . Coenzyme Q10 (COQ10) 100 MG CAPS Take 100 mg by mouth daily.    . Cyanocobalamin (VITAMIN B-12) 3000 MCG SUBL Take 3,000 mcg by mouth 2 (two) times daily.    . diclofenac sodium (VOLTAREN) 1 % GEL Apply 4 g topically 4 (four) times daily as needed (for pain).     Marland Kitchen ELIQUIS 5 MG TABS tablet TAKE (1) TABLET BY MOUTH TWICE DAILY. (Patient taking differently: Take 5 mg by mouth 2 (two) times daily. ) 180 tablet 1  . ketoconazole (NIZORAL) 2 % cream Apply 1 application topically 2 (two) times daily. applied under breasts    . lansoprazole (PREVACID) 30 MG capsule TAKE (1) CAPSULE BY MOUTH TWICE DAILY. 180 capsule 0  . levothyroxine (SYNTHROID) 112 MCG tablet Take 112 mcg by mouth daily before breakfast.    . lidocaine (LIDODERM) 5 % Place 1-3 patches onto the skin daily as needed (for pain). Remove & Discard patch within 12 hours or as directed by MD for pain    . mycophenolate (CELLCEPT) 250 MG capsule Take 1 capsule (250 mg total) by mouth 2 (two) times daily. 60 capsule 5  . oxyCODONE-acetaminophen  (PERCOCET) 10-325 MG tablet Take 1 tablet by mouth every 6 (six) hours as needed (pain).     . pravastatin (PRAVACHOL) 80 MG tablet Take 80 mg by mouth daily with supper.     . pyridostigmine (MESTINON) 60 MG tablet TAKE (1/2) TABLET BY MOUTH FOUR TIMES DAILY. 180 tablet 0  . SUMAtriptan (IMITREX) 50 MG tablet Take 50 mg by mouth every 2 (two) hours as needed. For migraines    . Terbinafine 1 % GEL Apply 1 application topically 2 (two) times daily as needed (toe nail fungus).     . TURMERIC PO Take 1,000 mg by mouth daily with supper.    . Magnesium 250 MG TABS Take 500 mg by mouth every evening.  Facility-Administered Medications Prior to Visit  Medication Dose Route Frequency Provider Last Rate Last Dose  . sodium chloride flush (NS) 0.9 % injection 3 mL  3 mL Intravenous Q12H Herminio Commons, MD         Allergies:   Nitrofurantoin macrocrystal, Ampicillin, Avocado, Biaxin [clarithromycin], Doxycycline monohydrate, Moxifloxacin, and Tetracyclines & related   Social History   Socioeconomic History  . Marital status: Widowed    Spouse name: Not on file  . Number of children: Not on file  . Years of education: Not on file  . Highest education level: Not on file  Occupational History  . Occupation: Retired Quarry manager  Social Needs  . Financial resource strain: Not on file  . Food insecurity    Worry: Not on file    Inability: Not on file  . Transportation needs    Medical: Not on file    Non-medical: Not on file  Tobacco Use  . Smoking status: Never Smoker  . Smokeless tobacco: Never Used  Substance and Sexual Activity  . Alcohol use: No    Alcohol/week: 0.0 standard drinks  . Drug use: No  . Sexual activity: Not on file  Lifestyle  . Physical activity    Days per week: Not on file    Minutes per session: Not on file  . Stress: Not on file  Relationships  . Social Herbalist on phone: Not on file    Gets together: Not on file    Attends religious  service: Not on file    Active member of club or organization: Not on file    Attends meetings of clubs or organizations: Not on file    Relationship status: Not on file  Other Topics Concern  . Not on file  Social History Narrative   Lives in [place] alone.  Retired Quarry manager.   Caffeine use:    Right handed     Family History:  The patient's family history includes Inflammatory bowel disease in her maternal grandmother.   Review of Systems:   Please see the history of present illness.     General:  No chills, fever, night sweats or weight changes.  Cardiovascular:  No  edema, orthopnea, palpitations, paroxysmal nocturnal dyspnea. Positive for chest pain and dyspnea on exertion.  Dermatological: No rash, lesions/masses Respiratory: No cough, dyspnea Urologic: No hematuria, dysuria Abdominal:   No nausea, vomiting, diarrhea, bright red blood per rectum, melena, or hematemesis Neurologic:  No visual changes, wkns, changes in mental status. All other systems reviewed and are otherwise negative except as noted above.   Physical Exam:    VS:  BP (!) 148/73   Pulse 73   Temp (!) 97.3 F (36.3 C)   Ht 5\' 3"  (1.6 m)   Wt 190 lb (86.2 kg)   BMI 33.66 kg/m    General: Well developed, well nourished,female appearing in no acute distress. Head: Normocephalic, atraumatic, sclera non-icteric, no xanthomas, nares are without discharge.  Neck: No carotid bruits. JVD not elevated.  Lungs: Respirations regular and unlabored, without wheezes or rales.  Heart: Regular rate and rhythm. No S3 or S4.  No murmur, no rubs, or gallops appreciated. Abdomen: Soft, non-tender, non-distended with normoactive bowel sounds. No hepatomegaly. No rebound/guarding. No obvious abdominal masses. Msk:  Strength and tone appear normal for age. No joint deformities or effusions. Extremities: No clubbing or cyanosis. No lower extremity  edema.  Distal pedal pulses are 2+ bilaterally. Radial site  stable  without ecchymosis or evidence of a hematoma.  Neuro: Alert and oriented X 3. Moves all extremities spontaneously. No focal deficits noted. Psych:  Responds to questions appropriately with a normal affect. Skin: No rashes or lesions noted  Wt Readings from Last 3 Encounters:  12/13/18 190 lb (86.2 kg)  11/28/18 191 lb 5 oz (86.8 kg)  11/19/18 191 lb (86.6 kg)     Studies/Labs Reviewed:   EKG:  EKG is not ordered today.   Recent Labs: 11/28/2018: ALT 10; BUN 15; Creatinine, Ser 1.21; Hemoglobin 13.9; Platelets 203; Potassium 4.6; Sodium 139   Lipid Panel No results found for: CHOL, TRIG, HDL, CHOLHDL, VLDL, LDLCALC, LDLDIRECT  Additional studies/ records that were reviewed today include:   NST: 06/2016  Diffuse nonspecific T wave abnormalities with rest and stress.  Defect 1: There is a medium defect of moderate severity present in the mid inferoseptal and mid inferior location.This appears to be due to soft tissue attenuation artifact.  This is a low risk study.  Nuclear stress EF: 59%.  Cardiac Catheterization: 10/2018  1st Diag lesion is 40% stenosed.  Hemodynamic findings consistent with pulmonary hypertension.  LV end diastolic pressure is normal.    Normal left ventricular function and hemodynamics.  Normal right heart pressures and oximetry.  Right dominant coronary anatomy  Normal left main, RCA, circumflex, and LAD.  First diagonal contains proximal 40% narrowing.  RECOMMENDATIONS:   Coronary artery, LV function, and pulmonary artery pressures are all normal.    Symptoms do not appear to be of cardiac origin.   Assessment:    1. Pleuritic chest pain   2. PAF (paroxysmal atrial fibrillation) ([place])   3. Sinus node dysfunction (HCC)   4. Essential hypertension      Plan:   In order of problems listed above:  1. Pleuritic Chest Pain - recent cardiac catheterization showed normal LM, RCA, LCx and LAD with first diagonal containing 40%  stenosis. LV function and hemodynamics were normal with normal pulmonary artery pressures as well. Reviewed with the patient in detail today.  - still having episodes of what sounds most consistent with pleuritic pain along her sternum but is improving per her report. Imaging in 10/2018 showed no evidence of recurrent effusions. Sed rate was elevated to 53 in 11/2018.  - given her continual discomfort, reviewed using a short course of NSAIDS (limit duration given the use of anticoagulation) but she says she was informed by GI to never take NSAIDS in any duration. Will review with patient's gastroenterologist if a short course of NSAIDS could be utilized for 1 week. If not and pain does not improve, would consider Colchicine but again a short-course given her CKD (creatinine 1.21 in 11/2018).   2. Paroxysmal Atrial Fibrillation - she denies any recent palpitations. Did have 4 AF episodes by recent device interrogation with a 1.1% AF burden. Continue Atenolol for rate-control and Eliquis for anticoagulation.   3. Tachy-brady Syndrome - s/p Medtronic PPM placement in 06/2017. Device interrogation last month showed normal device function with 4 AF episodes. - followed by Dr. Lovena Nolan.   4. HTN - BP elevated at 148/73 today but has overall been well-controlled per her report.  - continue current medication regimen with Atenolol 25mg  daily   Medication Adjustments/Labs and Tests Ordered: Current medicines are reviewed at length with the patient today.  Concerns regarding medicines are outlined above.  Medication changes, Labs and Tests ordered today are listed in the Patient Instructions below. Patient Instructions  Medication Instructions: Your physician recommends that you continue on your current medications as directed. Please refer to the Current Medication list given to you today.   Labwork: None today  Procedures/Testing: None today  Follow-Up:  6 months in office with Dr.Taylor  Any  Additional Special Instructions Will Be Listed Below (If Applicable).     If you need a refill on your cardiac medications before your next appointment, please call your pharmacy.     Thank you for choosing [facility] !           Signed, Erma Heritage, PA-C  12/14/2018 10:11 AM    [facility] [address] [place], [place] [address] Phone: [phone] Fax: [phone]

## 2018-12-14 ENCOUNTER — Encounter: Payer: Self-pay | Admitting: Student

## 2018-12-16 ENCOUNTER — Ambulatory Visit (HOSPITAL_COMMUNITY)
Admission: RE | Admit: 2018-12-16 | Discharge: 2018-12-16 | Disposition: A | Discharge status: Home / Self Care | Payer: Medicare Other | Source: Ambulatory Visit | Attending: Internal Medicine | Admitting: Internal Medicine

## 2018-12-16 ENCOUNTER — Ambulatory Visit (HOSPITAL_COMMUNITY)
Admission: RE | Admit: 2018-12-16 | Discharge: 2018-12-16 | Disposition: A | Discharge status: Home / Self Care | Payer: Medicare Other | Source: Ambulatory Visit | Attending: Surgery | Admitting: Surgery

## 2018-12-16 ENCOUNTER — Other Ambulatory Visit: Payer: Self-pay

## 2018-12-16 DIAGNOSIS — R0789 Other chest pain: Secondary | ICD-10-CM | POA: Diagnosis not present

## 2018-12-16 DIAGNOSIS — E041 Nontoxic single thyroid nodule: Secondary | ICD-10-CM | POA: Diagnosis not present

## 2018-12-16 DIAGNOSIS — E042 Nontoxic multinodular goiter: Secondary | ICD-10-CM | POA: Diagnosis not present

## 2018-12-16 DIAGNOSIS — K838 Other specified diseases of biliary tract: Secondary | ICD-10-CM | POA: Diagnosis not present

## 2018-12-16 DIAGNOSIS — E039 Hypothyroidism, unspecified: Secondary | ICD-10-CM | POA: Diagnosis not present

## 2018-12-16 MED ORDER — GADOBUTROL 1 MMOL/ML IV SOLN
9.0000 mL | Freq: Once | INTRAVENOUS | Status: AC | PRN
  Administered 2018-12-16: 9 mL via INTRAVENOUS

## 2018-12-17 ENCOUNTER — Telehealth: Payer: Self-pay | Admitting: Student

## 2018-12-17 NOTE — Telephone Encounter (Signed)
Pt notified and voiced understanding 

## 2018-12-17 NOTE — Telephone Encounter (Signed)
    Please let the patient know I discussed with Dr. Laural Golden and she can try a short-course of NSAIDS for her chest discomfort to see if she gets any improvement in her symptoms. Would initially recommend taking Motrin 400mg  BID for 1 week. If no improvement, can stop. If pain improves with this, would recommend updating [place] early next week so we can determine if to continue on NSAIDS or try Colchicine in place of this given her GI history.   Signed, Erma Heritage, PA-C 12/17/2018, 9:30 AM Pager: [phone]

## 2018-12-18 ENCOUNTER — Telehealth: Payer: Self-pay

## 2018-12-18 NOTE — Telephone Encounter (Signed)
Letter has been mailed to the pt asking for her to call the office and reschedule appt on 04/07/2019 at 2:00 pm due to provider change.

## 2018-12-30 ENCOUNTER — Other Ambulatory Visit: Payer: Self-pay | Admitting: Neurology

## 2018-12-30 DIAGNOSIS — Z79891 Long term (current) use of opiate analgesic: Secondary | ICD-10-CM | POA: Diagnosis not present

## 2018-12-30 DIAGNOSIS — G894 Chronic pain syndrome: Secondary | ICD-10-CM | POA: Diagnosis not present

## 2018-12-30 DIAGNOSIS — E782 Mixed hyperlipidemia: Secondary | ICD-10-CM | POA: Diagnosis not present

## 2018-12-30 DIAGNOSIS — G7000 Myasthenia gravis without (acute) exacerbation: Secondary | ICD-10-CM | POA: Diagnosis not present

## 2018-12-30 DIAGNOSIS — F331 Major depressive disorder, recurrent, moderate: Secondary | ICD-10-CM | POA: Diagnosis not present

## 2018-12-30 DIAGNOSIS — I129 Hypertensive chronic kidney disease with stage 1 through stage 4 chronic kidney disease, or unspecified chronic kidney disease: Secondary | ICD-10-CM | POA: Diagnosis not present

## 2018-12-30 DIAGNOSIS — E039 Hypothyroidism, unspecified: Secondary | ICD-10-CM | POA: Diagnosis not present

## 2018-12-30 DIAGNOSIS — I482 Chronic atrial fibrillation, unspecified: Secondary | ICD-10-CM | POA: Diagnosis not present

## 2018-12-30 DIAGNOSIS — Z950 Presence of cardiac pacemaker: Secondary | ICD-10-CM | POA: Diagnosis not present

## 2018-12-30 DIAGNOSIS — K219 Gastro-esophageal reflux disease without esophagitis: Secondary | ICD-10-CM | POA: Diagnosis not present

## 2018-12-30 DIAGNOSIS — G4700 Insomnia, unspecified: Secondary | ICD-10-CM | POA: Diagnosis not present

## 2019-02-04 ENCOUNTER — Ambulatory Visit (INDEPENDENT_AMBULATORY_CARE_PROVIDER_SITE_OTHER): Payer: Medicare Other | Admitting: *Deleted

## 2019-02-04 DIAGNOSIS — I480 Paroxysmal atrial fibrillation: Secondary | ICD-10-CM

## 2019-02-04 DIAGNOSIS — I495 Sick sinus syndrome: Secondary | ICD-10-CM | POA: Diagnosis not present

## 2019-02-05 LAB — CUP PACEART REMOTE DEVICE CHECK
Battery Remaining Longevity: 137 mo
Battery Voltage: 3.03 V
Brady Statistic AP VP Percent: 0.04 %
Brady Statistic AP VS Percent: 73.14 %
Brady Statistic AS VP Percent: 0.02 %
Brady Statistic AS VS Percent: 26.8 %
Brady Statistic RA Percent Paced: 73.11 %
Brady Statistic RV Percent Paced: 0.06 %
Date Time Interrogation Session: 20201117053349
Implantable Lead Implant Date: 20190501
Implantable Lead Implant Date: 20190501
Implantable Lead Location: 753859
Implantable Lead Location: 753860
Implantable Lead Model: 3830
Implantable Lead Model: 5076
Implantable Pulse Generator Implant Date: 20190501
Lead Channel Impedance Value: 323 Ohm
Lead Channel Impedance Value: 361 Ohm
Lead Channel Impedance Value: 380 Ohm
Lead Channel Impedance Value: 475 Ohm
Lead Channel Pacing Threshold Amplitude: 0.625 V
Lead Channel Pacing Threshold Amplitude: 0.875 V
Lead Channel Pacing Threshold Pulse Width: 0.4 ms
Lead Channel Pacing Threshold Pulse Width: 0.4 ms
Lead Channel Sensing Intrinsic Amplitude: 1.25 mV
Lead Channel Sensing Intrinsic Amplitude: 1.25 mV
Lead Channel Sensing Intrinsic Amplitude: 8.5 mV
Lead Channel Sensing Intrinsic Amplitude: 8.5 mV
Lead Channel Setting Pacing Amplitude: 2 V
Lead Channel Setting Pacing Amplitude: 2.5 V
Lead Channel Setting Pacing Pulse Width: 0.4 ms
Lead Channel Setting Sensing Sensitivity: 0.9 mV

## 2019-02-07 DIAGNOSIS — I10 Essential (primary) hypertension: Secondary | ICD-10-CM | POA: Diagnosis not present

## 2019-02-07 DIAGNOSIS — E782 Mixed hyperlipidemia: Secondary | ICD-10-CM | POA: Diagnosis not present

## 2019-02-07 DIAGNOSIS — E039 Hypothyroidism, unspecified: Secondary | ICD-10-CM | POA: Diagnosis not present

## 2019-02-07 DIAGNOSIS — N183 Chronic kidney disease, stage 3 unspecified: Secondary | ICD-10-CM | POA: Diagnosis not present

## 2019-02-12 ENCOUNTER — Other Ambulatory Visit: Payer: Self-pay

## 2019-02-12 DIAGNOSIS — E782 Mixed hyperlipidemia: Secondary | ICD-10-CM | POA: Diagnosis not present

## 2019-02-12 DIAGNOSIS — G894 Chronic pain syndrome: Secondary | ICD-10-CM | POA: Diagnosis not present

## 2019-02-12 DIAGNOSIS — I482 Chronic atrial fibrillation, unspecified: Secondary | ICD-10-CM | POA: Diagnosis not present

## 2019-02-12 DIAGNOSIS — I129 Hypertensive chronic kidney disease with stage 1 through stage 4 chronic kidney disease, or unspecified chronic kidney disease: Secondary | ICD-10-CM | POA: Diagnosis not present

## 2019-02-12 DIAGNOSIS — F331 Major depressive disorder, recurrent, moderate: Secondary | ICD-10-CM | POA: Diagnosis not present

## 2019-02-12 DIAGNOSIS — G7000 Myasthenia gravis without (acute) exacerbation: Secondary | ICD-10-CM | POA: Diagnosis not present

## 2019-02-12 DIAGNOSIS — Z79891 Long term (current) use of opiate analgesic: Secondary | ICD-10-CM | POA: Diagnosis not present

## 2019-02-12 DIAGNOSIS — K219 Gastro-esophageal reflux disease without esophagitis: Secondary | ICD-10-CM | POA: Diagnosis not present

## 2019-02-12 DIAGNOSIS — Z950 Presence of cardiac pacemaker: Secondary | ICD-10-CM | POA: Diagnosis not present

## 2019-02-12 DIAGNOSIS — G4700 Insomnia, unspecified: Secondary | ICD-10-CM | POA: Diagnosis not present

## 2019-02-12 DIAGNOSIS — E039 Hypothyroidism, unspecified: Secondary | ICD-10-CM | POA: Diagnosis not present

## 2019-02-18 DIAGNOSIS — F331 Major depressive disorder, recurrent, moderate: Secondary | ICD-10-CM | POA: Diagnosis not present

## 2019-02-18 DIAGNOSIS — K219 Gastro-esophageal reflux disease without esophagitis: Secondary | ICD-10-CM | POA: Diagnosis not present

## 2019-02-18 DIAGNOSIS — Z950 Presence of cardiac pacemaker: Secondary | ICD-10-CM | POA: Diagnosis not present

## 2019-02-18 DIAGNOSIS — E782 Mixed hyperlipidemia: Secondary | ICD-10-CM | POA: Diagnosis not present

## 2019-02-18 DIAGNOSIS — Z79891 Long term (current) use of opiate analgesic: Secondary | ICD-10-CM | POA: Diagnosis not present

## 2019-02-18 DIAGNOSIS — I129 Hypertensive chronic kidney disease with stage 1 through stage 4 chronic kidney disease, or unspecified chronic kidney disease: Secondary | ICD-10-CM | POA: Diagnosis not present

## 2019-02-18 DIAGNOSIS — I482 Chronic atrial fibrillation, unspecified: Secondary | ICD-10-CM | POA: Diagnosis not present

## 2019-02-18 DIAGNOSIS — Z0001 Encounter for general adult medical examination with abnormal findings: Secondary | ICD-10-CM | POA: Diagnosis not present

## 2019-02-18 DIAGNOSIS — G7000 Myasthenia gravis without (acute) exacerbation: Secondary | ICD-10-CM | POA: Diagnosis not present

## 2019-02-18 DIAGNOSIS — G894 Chronic pain syndrome: Secondary | ICD-10-CM | POA: Diagnosis not present

## 2019-02-18 DIAGNOSIS — E039 Hypothyroidism, unspecified: Secondary | ICD-10-CM | POA: Diagnosis not present

## 2019-02-18 DIAGNOSIS — G4700 Insomnia, unspecified: Secondary | ICD-10-CM | POA: Diagnosis not present

## 2019-02-24 ENCOUNTER — Other Ambulatory Visit: Payer: Self-pay | Admitting: Neurology

## 2019-02-24 ENCOUNTER — Telehealth: Payer: Self-pay | Admitting: Neurology

## 2019-02-24 MED ORDER — MYCOPHENOLATE MOFETIL 500 MG PO TABS: 500.0000 mg | ORAL_TABLET | Freq: Two times a day (BID) | ORAL | 1 refills | Status: DC

## 2019-02-24 NOTE — Telephone Encounter (Signed)
I reached out to the pt and scheduled her for 03/10/2019 at 12 pm check in time of 11:30 am.

## 2019-02-24 NOTE — Telephone Encounter (Signed)
I called the patient.  The patient's had increasing weakness, tremulousness, fatigue.  She does have thyroid disease and is on Synthroid, they tend to keep her TSH on the low range.  Her T4 level was 1.5.   Given the worsening symptoms, we will increase the CellCept to 500 mg twice daily, I will get an earlier revisit.

## 2019-02-24 NOTE — Telephone Encounter (Signed)
Pt called in and stated that she is having tremors , fatigue SOB, and Nausea   She states she is mycophenolate (CELLCEPT) 250 MG capsule but she didn't take it bid this weekend she only took it once a day.  Pt is requesting a call back to discuss

## 2019-02-27 ENCOUNTER — Other Ambulatory Visit: Payer: Self-pay | Admitting: Internal Medicine

## 2019-02-27 NOTE — Telephone Encounter (Signed)
This is a [place] pt.  °

## 2019-03-04 NOTE — Progress Notes (Signed)
Remote pacemaker transmission.   

## 2019-03-08 ENCOUNTER — Other Ambulatory Visit (INDEPENDENT_AMBULATORY_CARE_PROVIDER_SITE_OTHER): Payer: Self-pay | Admitting: Internal Medicine

## 2019-03-08 DIAGNOSIS — K219 Gastro-esophageal reflux disease without esophagitis: Secondary | ICD-10-CM

## 2019-03-10 ENCOUNTER — Encounter: Payer: Self-pay | Admitting: Neurology

## 2019-03-10 ENCOUNTER — Other Ambulatory Visit: Payer: Self-pay

## 2019-03-10 ENCOUNTER — Ambulatory Visit (INDEPENDENT_AMBULATORY_CARE_PROVIDER_SITE_OTHER): Payer: Medicare Other | Admitting: Neurology

## 2019-03-10 VITALS — BP 132/80 | HR 65 | Temp 97.6°F | Wt 187.0 lb

## 2019-03-10 DIAGNOSIS — Z5181 Encounter for therapeutic drug level monitoring: Secondary | ICD-10-CM | POA: Diagnosis not present

## 2019-03-10 DIAGNOSIS — E538 Deficiency of other specified B group vitamins: Secondary | ICD-10-CM | POA: Diagnosis not present

## 2019-03-10 MED ORDER — TRAZODONE HCL 50 MG PO TABS: 50.0000 mg | ORAL_TABLET | Freq: Every day | ORAL | 2 refills | Status: DC

## 2019-03-10 NOTE — Progress Notes (Signed)
Reason for visit: Myasthenia gravis, fatigue  [name] is an 78 y.o. female  History of present illness:  [name] is an 78 year old right-handed white female with a history of myasthenia gravis with ocular features.  The patient has been treated only with pyridostigmine for 20 years, she had recently onset of generalized fatigue and shortness of breath.  The patient had stress from the death of 2 family members, she has had increased on her Wellbutrin to 150 mg daily.  She claims that she does not feel depressed.  The patient continues to have significant fatigue, she gets short winded but she claims that her oxygen saturations remain normal.  She does not have double vision but she has significantly decreased visual acuity in the right eye, she denies any problems with chewing or swallowing.  She denies any ptosis.  The patient feels weak all over.  She uses a walker for ambulation, she believes that her balance has been affected.  She does have some chronic neck pain and shoulder discomfort, she has degenerative arthritis in the hips and knees.  She has had recent checks on her thyroid.  She takes B12 supplementation.  She returns for an evaluation.  She does believe that the fatigue is getting worse.  She does have some difficulty sleeping at night.  She denies that she is having to nap during the day.  Past Medical History:  Diagnosis Date  . Anemia   . Chronic back pain   . Chronic nausea   . Complication of anesthesia    Low blood pressure, heart rate, O2 sat  . Depression with anxiety   . Depression with anxiety   . Diverticulosis   . Dyspnea   . Essential hypertension   . GERD (gastroesophageal reflux disease)   . History of pneumonia   . Hypothyroidism   . IBS (irritable colon syndrome)   . LVH (left ventricular hypertrophy)   . Migraines   . Mixed hyperlipidemia   . Obesity   . Ocular myasthenia gravis ([place])   . Osteoarthritis of knee   . PAF (paroxysmal atrial  fibrillation) (HCC)    chads2vasc score is at least 4  . Paraganglioma ([place])    Resection 02/2012 (retroperitoneal)  . Paraganglioma, malignant ([place]) 11/06/2015  . Presence of permanent cardiac pacemaker 07/18/2017  . Renal insufficiency   . Skin cancer     Past Surgical History:  Procedure Laterality Date  . ABDOMINAL HYSTERECTOMY    . ABDOMINAL HYSTERECTOMY    . CARDIAC CATHETERIZATION    . CHOLECYSTECTOMY    . COLONOSCOPY  08/24/2011   Procedure: COLONOSCOPY;  Surgeon: Rogene Houston, MD;  [facility];  Service: Endoscopy;  Laterality: N/A;  1200  . CT guided biopsy  01/16/12  . GIVENS CAPSULE STUDY  01/29/2012   Procedure: GIVENS CAPSULE STUDY;  Surgeon: Rogene Houston, MD;  [facility];  Service: Endoscopy;  Laterality: N/A;  730  . KNEE SURGERY    . LAPAROTOMY  03/01/2012   Procedure: EXPLORATORY LAPAROTOMY;  Surgeon: Earnstine Regal, MD;  [facility];  Service: General;  Laterality: N/A;  Exploratory Laparotomy ,Resection Retroperitoneal Mass  . NASAL SINUS SURGERY     30 yrs ago  . PACEMAKER IMPLANT N/A 07/18/2017   Procedure: PACEMAKER IMPLANT;  Surgeon: Evans Lance, MD;  Location: [facility];  Service: Cardiovascular;  Laterality: N/A;  . RIGHT/LEFT HEART CATH AND CORONARY ANGIOGRAPHY N/A 11/18/2018  Procedure: RIGHT/LEFT HEART CATH AND CORONARY ANGIOGRAPHY;  Surgeon: Belva Crome, MD;  Location: [facility];  Service: Cardiovascular;  Laterality: N/A;  . TOTAL KNEE ARTHROPLASTY Bilateral    2005 and 2011    Family History  Problem Relation Age of Onset  . Inflammatory bowel disease Maternal Grandmother     Social history:  reports that she has never smoked. She has never used smokeless tobacco. She reports that she does not drink alcohol or use drugs.    Allergies  Allergen Reactions  . Nitrofurantoin Macrocrystal Nausea And Vomiting  . Ampicillin Rash    Did it involve swelling of the face/tongue/throat, SOB, or  low BP? No Did it involve sudden or severe rash/hives, skin peeling, or any reaction on the inside of your mouth or nose? No Did you need to seek medical attention at a hospital or doctor's office? No When did it last happen?20 years ago If all above answers are "NO", may proceed with cephalosporin use.   Marland Kitchen Avocado Diarrhea and Nausea And Vomiting  . Biaxin [Clarithromycin] Rash  . Doxycycline Monohydrate Nausea And Vomiting  . Moxifloxacin     Contraindication myasthenia gravis  . Tetracyclines & Related Nausea And Vomiting    Medications:  Prior to Admission medications   Medication Sig Start Date End Date Taking? Authorizing Provider  ALPRAZolam Duanne Moron) 1 MG tablet Take 1 mg by mouth 2 (two) times daily as needed.    Yes [provider]  apixaban (ELIQUIS) 5 MG TABS tablet Eliquis 5 mg tablet   Yes [provider]  Artificial Tear Solution (SOOTHE XP OP) Place 1 drop into both eyes 4 (four) times daily.   Yes [provider]  atenolol (TENORMIN) 25 MG tablet TAKE (1) TABLET BY MOUTH ONCE DAILY. MAY TAKE AN ADDITIONAL TABLET IF SYSTOLIC OVER Q000111Q OR SEVERE PALPITATIONS. 02/27/19  Yes Evans Lance, MD  B Complex-C (B-COMPLEX WITH VITAMIN C) tablet Take 1 tablet by mouth daily with supper.   Yes [provider]  buPROPion (WELLBUTRIN SR) 150 MG 12 hr tablet Take 150 mg by mouth daily. 10/02/18  Yes [provider]  cholecalciferol (VITAMIN D) 1000 units tablet Take 1,000 Units by mouth 2 (two) times a day.    Yes [provider]  Coenzyme Q10 (COQ10) 100 MG CAPS Take 100 mg by mouth daily.   Yes [provider]  diclofenac sodium (VOLTAREN) 1 % GEL Apply 4 g topically 4 (four) times daily as needed (for pain).  01/13/15  Yes [provider]  ELIQUIS 5 MG TABS tablet TAKE (1) TABLET BY MOUTH TWICE DAILY. Patient taking differently: Take 5 mg by mouth 2 (two) times daily.  08/13/18  Yes Evans Lance, MD    furosemide (LASIX) 20 MG tablet  09/24/18  Yes [provider]  ketoconazole (NIZORAL) 2 % cream Apply 1 application topically 2 (two) times daily. applied under breasts 06/17/18  Yes [provider]  lansoprazole (PREVACID) 30 MG capsule TAKE (1) CAPSULE BY MOUTH TWICE DAILY. 11/19/18  Yes Rehman, Mechele Dawley, MD  levothyroxine (SYNTHROID) 112 MCG tablet Take 112 mcg by mouth daily before breakfast.   Yes [provider]  lidocaine (LIDODERM) 5 % Place 1-3 patches onto the skin daily as needed (for pain). Remove & Discard patch within 12 hours or as directed by MD for pain   Yes [provider]  mycophenolate (CELLCEPT) 500 MG tablet Take 1 tablet (500 mg total) by mouth  2 (two) times daily. 02/24/19  Yes Kathrynn Ducking, MD  oxyCODONE-acetaminophen (PERCOCET) 10-325 MG tablet Take 1 tablet by mouth every 6 (six) hours as needed (pain).  05/14/18  Yes [provider]  potassium chloride (KLOR-CON) 10 MEQ tablet  09/24/18  Yes [provider]  pravastatin (PRAVACHOL) 80 MG tablet Take 80 mg by mouth daily with supper.    Yes [provider]  pyridostigmine (MESTINON) 60 MG tablet TAKE (1/2) TABLET BY MOUTH FOUR TIMES DAILY. 02/24/19  Yes Kathrynn Ducking, MD  SUMAtriptan (IMITREX) 50 MG tablet Take 50 mg by mouth every 2 (two) hours as needed. For migraines   Yes [provider]  Terbinafine 1 % GEL Apply 1 application topically 2 (two) times daily as needed (toe nail fungus).    Yes [provider]  TURMERIC PO Take 1,000 mg by mouth daily with supper. Take twice a day   Yes [provider]    ROS:  Out of a complete 14 system review of symptoms, the patient complains only of the following symptoms, and all other reviewed systems are negative.  Generalized fatigue Shortness of breath Insomnia  Blood pressure 132/80, pulse 65, temperature 97.6 F (36.4 C), weight 187 lb (84.8 kg).  Physical Exam  General:  The patient is alert and cooperative at the time of the examination.  Skin: No significant peripheral edema is noted.   Neurologic Exam  Mental status: The patient is alert and oriented x 3 at the time of the examination. The patient has apparent normal recent and remote memory, with an apparently normal attention span and concentration ability.   Cranial nerves: Facial symmetry is present. Speech is normal, no aphasia or dysarthria is noted. Extraocular movements are full.  With primary gaze, there is some medial deviation of the right eye.  No ptosis is seen.  Visual fields are full.   Motor: The patient has good strength in all 4 extremities, with exception of slight weakness with hip flexion bilaterally.  Sensory examination: Soft touch sensation is symmetric on the face, arms, and legs.  Coordination: The patient has good finger-nose-finger and heel-to-shin bilaterally.  Gait and station: The patient has a slightly wide-based gait, she can walk independently.  She normally uses a walker.  She is able to rise from a seated position with arms crossed. Romberg is negative. No drift is seen.  Reflexes: Deep tendon reflexes are symmetric.   Assessment/Plan:  1.  Myasthenia gravis  2.  Generalized fatigue  The source of the generalized fatigue is not clear, the CellCept dose was just increased from 250 mg twice daily to 500 mg twice daily within the last 2 weeks.  The patient does not wish to go on low-dose steroids such as prednisone.  The patient will be given trazodone 50 mg at night for sleep.  It is not clear if there is some underlying depression adding to the symptoms of generalized fatigue.  The patient will have blood work done today, she will follow-up for next scheduled visit in March.  Jill Alexanders MD 03/10/2019 12:29 PM  [facility] [address] [place],  [address]  Phone [phone] Fax [phone]

## 2019-03-11 ENCOUNTER — Telehealth: Payer: Self-pay | Admitting: Neurology

## 2019-03-11 LAB — CBC WITH DIFFERENTIAL/PLATELET
Basophils Absolute: 0.1 10*3/uL (ref 0.0–0.2)
Basos: 1 %
EOS (ABSOLUTE): 0.1 10*3/uL (ref 0.0–0.4)
Eos: 1 %
Hematocrit: 46 % (ref 34.0–46.6)
Hemoglobin: 15.2 g/dL (ref 11.1–15.9)
Immature Grans (Abs): 0 10*3/uL (ref 0.0–0.1)
Immature Granulocytes: 0 %
Lymphocytes Absolute: 3 10*3/uL (ref 0.7–3.1)
Lymphs: 31 %
MCH: 32.6 pg (ref 26.6–33.0)
MCHC: 33 g/dL (ref 31.5–35.7)
MCV: 99 fL — ABNORMAL HIGH (ref 79–97)
Monocytes Absolute: 0.8 10*3/uL (ref 0.1–0.9)
Monocytes: 8 %
Neutrophils Absolute: 5.7 10*3/uL (ref 1.4–7.0)
Neutrophils: 59 %
Platelets: 197 10*3/uL (ref 150–450)
RBC: 4.66 x10E6/uL (ref 3.77–5.28)
RDW: 12.2 % (ref 11.7–15.4)
WBC: 9.6 10*3/uL (ref 3.4–10.8)

## 2019-03-11 LAB — COMPREHENSIVE METABOLIC PANEL
ALT: 9 IU/L (ref 0–32)
AST: 17 IU/L (ref 0–40)
Albumin/Globulin Ratio: 1.7 (ref 1.2–2.2)
Albumin: 4.6 g/dL (ref 3.7–4.7)
Alkaline Phosphatase: 71 IU/L (ref 39–117)
BUN/Creatinine Ratio: 12 (ref 12–28)
BUN: 15 mg/dL (ref 8–27)
Bilirubin Total: 0.6 mg/dL (ref 0.0–1.2)
CO2: 20 mmol/L (ref 20–29)
Calcium: 10.2 mg/dL (ref 8.7–10.3)
Chloride: 103 mmol/L (ref 96–106)
Creatinine, Ser: 1.24 mg/dL — ABNORMAL HIGH (ref 0.57–1.00)
GFR calc Af Amer: 47 mL/min/{1.73_m2} — ABNORMAL LOW (ref >59)
GFR calc non Af Amer: 41 mL/min/{1.73_m2} — ABNORMAL LOW (ref >59)
Globulin, Total: 2.7 g/dL (ref 1.5–4.5)
Glucose: 92 mg/dL (ref 65–99)
Potassium: 3.8 mmol/L (ref 3.5–5.2)
Sodium: 141 mmol/L (ref 134–144)
Total Protein: 7.3 g/dL (ref 6.0–8.5)

## 2019-03-11 LAB — VITAMIN B12: Vitamin B-12: 960 pg/mL (ref 232–1245)

## 2019-03-11 NOTE — Telephone Encounter (Signed)
Patient is calling to report that her mother had pernicious anemia and needed to get b12 shots on a regular basis. Please follow up.

## 2019-03-11 NOTE — Telephone Encounter (Signed)
I called pt about her phone call. PT wanted Dr. Jannifer Franklin to know her mom had pernicious anemia and got  b12 injections. I stated her labs were unremarkable b 12 was normal. Also that renal insufficiency was stable. .Pt stated she saw the labs after she call our office and saw the labs were normal. Pt appreciate the call and verbalized understanding.

## 2019-03-11 NOTE — Telephone Encounter (Signed)
Pamela Ducking, MD  03/11/2019 7:04 AM EST    Blood work is unremarkable exception of chronic stable renal insufficiency, B12 level is normal. No change in medical therapy.

## 2019-03-11 NOTE — Telephone Encounter (Signed)
I called pts daughter who is POA about the phone call. [name] stated she did not call and to call her mom.

## 2019-03-17 DIAGNOSIS — I129 Hypertensive chronic kidney disease with stage 1 through stage 4 chronic kidney disease, or unspecified chronic kidney disease: Secondary | ICD-10-CM | POA: Diagnosis not present

## 2019-03-17 DIAGNOSIS — K219 Gastro-esophageal reflux disease without esophagitis: Secondary | ICD-10-CM | POA: Diagnosis not present

## 2019-03-17 DIAGNOSIS — Z950 Presence of cardiac pacemaker: Secondary | ICD-10-CM | POA: Diagnosis not present

## 2019-03-17 DIAGNOSIS — G894 Chronic pain syndrome: Secondary | ICD-10-CM | POA: Diagnosis not present

## 2019-03-17 DIAGNOSIS — G4700 Insomnia, unspecified: Secondary | ICD-10-CM | POA: Diagnosis not present

## 2019-03-17 DIAGNOSIS — E039 Hypothyroidism, unspecified: Secondary | ICD-10-CM | POA: Diagnosis not present

## 2019-03-17 DIAGNOSIS — E782 Mixed hyperlipidemia: Secondary | ICD-10-CM | POA: Diagnosis not present

## 2019-03-17 DIAGNOSIS — G7000 Myasthenia gravis without (acute) exacerbation: Secondary | ICD-10-CM | POA: Diagnosis not present

## 2019-03-17 DIAGNOSIS — F331 Major depressive disorder, recurrent, moderate: Secondary | ICD-10-CM | POA: Diagnosis not present

## 2019-03-17 DIAGNOSIS — I482 Chronic atrial fibrillation, unspecified: Secondary | ICD-10-CM | POA: Diagnosis not present

## 2019-03-17 DIAGNOSIS — N189 Chronic kidney disease, unspecified: Secondary | ICD-10-CM | POA: Diagnosis not present

## 2019-03-17 DIAGNOSIS — Z79891 Long term (current) use of opiate analgesic: Secondary | ICD-10-CM | POA: Diagnosis not present

## 2019-04-07 ENCOUNTER — Ambulatory Visit: Payer: Medicare Other | Admitting: Neurology

## 2019-04-08 ENCOUNTER — Other Ambulatory Visit: Payer: Self-pay | Admitting: Neurology

## 2019-05-05 DIAGNOSIS — Z961 Presence of intraocular lens: Secondary | ICD-10-CM | POA: Diagnosis not present

## 2019-05-05 DIAGNOSIS — G7000 Myasthenia gravis without (acute) exacerbation: Secondary | ICD-10-CM | POA: Diagnosis not present

## 2019-05-06 ENCOUNTER — Ambulatory Visit (INDEPENDENT_AMBULATORY_CARE_PROVIDER_SITE_OTHER): Payer: Medicare Other | Admitting: *Deleted

## 2019-05-06 DIAGNOSIS — I495 Sick sinus syndrome: Secondary | ICD-10-CM

## 2019-05-07 ENCOUNTER — Telehealth: Payer: Self-pay

## 2019-05-07 LAB — CUP PACEART REMOTE DEVICE CHECK
Battery Remaining Longevity: 136 mo
Battery Voltage: 3.03 V
Brady Statistic AP VP Percent: 0.03 %
Brady Statistic AP VS Percent: 90.6 %
Brady Statistic AS VP Percent: 0.01 %
Brady Statistic AS VS Percent: 9.37 %
Brady Statistic RA Percent Paced: 90.56 %
Brady Statistic RV Percent Paced: 0.04 %
Date Time Interrogation Session: 20210216201023
Implantable Lead Implant Date: 20190501
Implantable Lead Implant Date: 20190501
Implantable Lead Location: 753859
Implantable Lead Location: 753860
Implantable Lead Model: 3830
Implantable Lead Model: 5076
Implantable Pulse Generator Implant Date: 20190501
Lead Channel Impedance Value: 342 Ohm
Lead Channel Impedance Value: 361 Ohm
Lead Channel Impedance Value: 418 Ohm
Lead Channel Impedance Value: 494 Ohm
Lead Channel Pacing Threshold Amplitude: 0.625 V
Lead Channel Pacing Threshold Amplitude: 0.875 V
Lead Channel Pacing Threshold Pulse Width: 0.4 ms
Lead Channel Pacing Threshold Pulse Width: 0.4 ms
Lead Channel Sensing Intrinsic Amplitude: 1.25 mV
Lead Channel Sensing Intrinsic Amplitude: 1.25 mV
Lead Channel Sensing Intrinsic Amplitude: 8.625 mV
Lead Channel Sensing Intrinsic Amplitude: 8.625 mV
Lead Channel Setting Pacing Amplitude: 2 V
Lead Channel Setting Pacing Amplitude: 2.5 V
Lead Channel Setting Pacing Pulse Width: 0.4 ms
Lead Channel Setting Sensing Sensitivity: 0.9 mV

## 2019-05-07 NOTE — Telephone Encounter (Signed)
Will forward to device clinic @ [facility] Office.

## 2019-05-07 NOTE — Progress Notes (Signed)
PPM Remote  

## 2019-05-07 NOTE — Telephone Encounter (Signed)
05-07-19 Pt called not sure remote message was rec'd.  Please call [phone]  Thanks renee

## 2019-05-09 ENCOUNTER — Other Ambulatory Visit (HOSPITAL_COMMUNITY): Payer: Self-pay | Admitting: Internal Medicine

## 2019-05-09 DIAGNOSIS — R0602 Shortness of breath: Secondary | ICD-10-CM

## 2019-05-11 DIAGNOSIS — G7000 Myasthenia gravis without (acute) exacerbation: Secondary | ICD-10-CM | POA: Diagnosis not present

## 2019-05-11 DIAGNOSIS — F331 Major depressive disorder, recurrent, moderate: Secondary | ICD-10-CM | POA: Diagnosis not present

## 2019-05-11 DIAGNOSIS — K219 Gastro-esophageal reflux disease without esophagitis: Secondary | ICD-10-CM | POA: Diagnosis not present

## 2019-05-11 DIAGNOSIS — I129 Hypertensive chronic kidney disease with stage 1 through stage 4 chronic kidney disease, or unspecified chronic kidney disease: Secondary | ICD-10-CM | POA: Diagnosis not present

## 2019-05-11 DIAGNOSIS — E039 Hypothyroidism, unspecified: Secondary | ICD-10-CM | POA: Diagnosis not present

## 2019-05-11 DIAGNOSIS — E782 Mixed hyperlipidemia: Secondary | ICD-10-CM | POA: Diagnosis not present

## 2019-05-11 DIAGNOSIS — I482 Chronic atrial fibrillation, unspecified: Secondary | ICD-10-CM | POA: Diagnosis not present

## 2019-05-11 DIAGNOSIS — G894 Chronic pain syndrome: Secondary | ICD-10-CM | POA: Diagnosis not present

## 2019-05-11 DIAGNOSIS — G4700 Insomnia, unspecified: Secondary | ICD-10-CM | POA: Diagnosis not present

## 2019-05-11 DIAGNOSIS — Z79891 Long term (current) use of opiate analgesic: Secondary | ICD-10-CM | POA: Diagnosis not present

## 2019-05-11 DIAGNOSIS — Z950 Presence of cardiac pacemaker: Secondary | ICD-10-CM | POA: Diagnosis not present

## 2019-05-12 DIAGNOSIS — Z23 Encounter for immunization: Secondary | ICD-10-CM | POA: Diagnosis not present

## 2019-05-14 ENCOUNTER — Other Ambulatory Visit (HOSPITAL_COMMUNITY): Payer: Self-pay | Admitting: Internal Medicine

## 2019-05-14 ENCOUNTER — Other Ambulatory Visit (HOSPITAL_COMMUNITY): Payer: Self-pay | Admitting: Adult Health Nurse Practitioner

## 2019-05-14 DIAGNOSIS — Z1231 Encounter for screening mammogram for malignant neoplasm of breast: Secondary | ICD-10-CM

## 2019-05-22 ENCOUNTER — Ambulatory Visit (INDEPENDENT_AMBULATORY_CARE_PROVIDER_SITE_OTHER): Payer: Medicare Other | Admitting: Neurology

## 2019-05-22 ENCOUNTER — Encounter: Payer: Self-pay | Admitting: Neurology

## 2019-05-22 ENCOUNTER — Other Ambulatory Visit: Payer: Self-pay

## 2019-05-22 VITALS — BP 124/72 | HR 68 | Temp 97.5°F | Ht 63.0 in | Wt 186.2 lb

## 2019-05-22 DIAGNOSIS — Z5181 Encounter for therapeutic drug level monitoring: Secondary | ICD-10-CM | POA: Diagnosis not present

## 2019-05-22 MED ORDER — PREDNISONE 5 MG PO TABS: 10.0000 mg | ORAL_TABLET | Freq: Every day | ORAL | 1 refills | Status: DC

## 2019-05-22 NOTE — Progress Notes (Signed)
Reason for visit: Myasthenia gravis  [name] is an 79 y.o. female  History of present illness:  [name] is an 79 year old right-handed white female with a history of ocular myasthenia gravis.  The patient currently is on CellCept 500 mg twice daily.  She has had some increased generalized fatigue, she has dyspnea on exertion.  She has had some episodes of chest pain with lying down, when she sits up or stands and this improves.  The patient is sleeping much better on trazodone but this has not fully ameliorated the fatigue issue.  The patient reports decreased visual acuity in the right eye, she has had cataract surgery on the left.  She still sees double off and on.  She does not operate a motor vehicle.  She returns to the office today for an evaluation.  She has a chronic gait disorder, she uses a cane or a walker to get around, she has fallen on 2 occasions since last seen.  She denies any problems with chewing or swallowing.  Past Medical History:  Diagnosis Date  . Anemia   . Chronic back pain   . Chronic nausea   . Complication of anesthesia    Low blood pressure, heart rate, O2 sat  . Depression with anxiety   . Depression with anxiety   . Diverticulosis   . Dyspnea   . Essential hypertension   . GERD (gastroesophageal reflux disease)   . History of pneumonia   . Hypothyroidism   . IBS (irritable colon syndrome)   . LVH (left ventricular hypertrophy)   . Migraines   . Mixed hyperlipidemia   . Obesity   . Ocular myasthenia gravis ([place])   . Osteoarthritis of knee   . PAF (paroxysmal atrial fibrillation) (HCC)    chads2vasc score is at least 4  . Paraganglioma ([place])    Resection 02/2012 (retroperitoneal)  . Paraganglioma, malignant ([place]) 11/06/2015  . Presence of permanent cardiac pacemaker 07/18/2017  . Renal insufficiency   . Skin cancer     Past Surgical History:  Procedure Laterality Date  . ABDOMINAL HYSTERECTOMY    . ABDOMINAL HYSTERECTOMY    .  CARDIAC CATHETERIZATION    . CHOLECYSTECTOMY    . COLONOSCOPY  08/24/2011   Procedure: COLONOSCOPY;  Surgeon: Rogene Houston, MD;  [facility];  Service: Endoscopy;  Laterality: N/A;  1200  . CT guided biopsy  01/16/12  . GIVENS CAPSULE STUDY  01/29/2012   Procedure: GIVENS CAPSULE STUDY;  Surgeon: Rogene Houston, MD;  [facility];  Service: Endoscopy;  Laterality: N/A;  730  . KNEE SURGERY    . LAPAROTOMY  03/01/2012   Procedure: EXPLORATORY LAPAROTOMY;  Surgeon: Earnstine Regal, MD;  [facility];  Service: General;  Laterality: N/A;  Exploratory Laparotomy ,Resection Retroperitoneal Mass  . NASAL SINUS SURGERY     30 yrs ago  . PACEMAKER IMPLANT N/A 07/18/2017   Procedure: PACEMAKER IMPLANT;  Surgeon: Evans Lance, MD;  Location: [facility];  Service: Cardiovascular;  Laterality: N/A;  . RIGHT/LEFT HEART CATH AND CORONARY ANGIOGRAPHY N/A 11/18/2018   Procedure: RIGHT/LEFT HEART CATH AND CORONARY ANGIOGRAPHY;  Surgeon: Belva Crome, MD;  Location: [facility];  Service: Cardiovascular;  Laterality: N/A;  . TOTAL KNEE ARTHROPLASTY Bilateral    2005 and 2011    Family History  Problem Relation Age of Onset  . Inflammatory bowel disease Maternal Grandmother  Social history:  reports that she has never smoked. She has never used smokeless tobacco. She reports that she does not drink alcohol or use drugs.    Allergies  Allergen Reactions  . Nitrofurantoin Macrocrystal Nausea And Vomiting  . Ampicillin Rash    Did it involve swelling of the face/tongue/throat, SOB, or low BP? No Did it involve sudden or severe rash/hives, skin peeling, or any reaction on the inside of your mouth or nose? No Did you need to seek medical attention at a hospital or doctor's office? No When did it last happen?20 years ago If all above answers are "NO", may proceed with cephalosporin use.   Marland Kitchen Avocado Diarrhea and Nausea And Vomiting  . Biaxin  [Clarithromycin] Rash  . Doxycycline Monohydrate Nausea And Vomiting  . Moxifloxacin     Contraindication myasthenia gravis  . Tetracyclines & Related Nausea And Vomiting    Medications:  Prior to Admission medications   Medication Sig Start Date End Date Taking? Authorizing Provider  ALPRAZolam Duanne Moron) 1 MG tablet Take 1 mg by mouth 2 (two) times daily as needed.    Yes [provider]  apixaban (ELIQUIS) 5 MG TABS tablet Eliquis 5 mg tablet   Yes [provider]  Artificial Tear Solution (SOOTHE XP OP) Place 1 drop into both eyes 4 (four) times daily.   Yes [provider]  atenolol (TENORMIN) 25 MG tablet TAKE (1) TABLET BY MOUTH ONCE DAILY. MAY TAKE AN ADDITIONAL TABLET IF SYSTOLIC OVER Q000111Q OR SEVERE PALPITATIONS. 02/27/19  Yes Evans Lance, MD  B Complex-C (B-COMPLEX WITH VITAMIN C) tablet Take 1 tablet by mouth daily with supper.   Yes [provider]  buPROPion (WELLBUTRIN SR) 150 MG 12 hr tablet Take 150 mg by mouth daily. 10/02/18  Yes [provider]  cholecalciferol (VITAMIN D) 1000 units tablet Take 1,000 Units by mouth 2 (two) times a day.    Yes [provider]  Coenzyme Q10 (COQ10) 100 MG CAPS Take 100 mg by mouth daily.   Yes [provider]  diclofenac sodium (VOLTAREN) 1 % GEL Apply 4 g topically 4 (four) times daily as needed (for pain).  01/13/15  Yes [provider]  ELIQUIS 5 MG TABS tablet TAKE (1) TABLET BY MOUTH TWICE DAILY. Patient taking differently: Take 5 mg by mouth 2 (two) times daily.  08/13/18  Yes Evans Lance, MD  furosemide (LASIX) 20 MG tablet  09/24/18  Yes [provider]  ketoconazole (NIZORAL) 2 % cream Apply 1 application topically 2 (two) times daily. applied under breasts 06/17/18  Yes [provider]  lansoprazole (PREVACID) 30 MG capsule TAKE (1) CAPSULE BY MOUTH TWICE DAILY. 03/10/19  Yes Rehman, Mechele Dawley, MD  levothyroxine (SYNTHROID) 112 MCG tablet Take  112 mcg by mouth daily before breakfast.   Yes [provider]  lidocaine (LIDODERM) 5 % Place 1-3 patches onto the skin daily as needed (for pain). Remove & Discard patch within 12 hours or as directed by MD for pain   Yes [provider]  mycophenolate (CELLCEPT) 500 MG tablet Take 1 tablet (500 mg total) by mouth 2 (two) times daily. 02/24/19  Yes Kathrynn Ducking, MD  oxyCODONE-acetaminophen (PERCOCET) 10-325 MG tablet Take 1 tablet by mouth every 6 (six) hours as needed (pain).  05/14/18  Yes [provider]  potassium chloride (KLOR-CON) 10 MEQ tablet  09/24/18  Yes [provider]  pravastatin (PRAVACHOL) 80 MG tablet Take 80 mg by  mouth daily with supper.    Yes [provider]  pyridostigmine (MESTINON) 60 MG tablet TAKE (1/2) TABLET BY MOUTH FOUR TIMES DAILY. 02/24/19  Yes Kathrynn Ducking, MD  SUMAtriptan (IMITREX) 50 MG tablet Take 50 mg by mouth every 2 (two) hours as needed. For migraines   Yes [provider]  Terbinafine 1 % GEL Apply 1 application topically 2 (two) times daily as needed (toe nail fungus).    Yes [provider]  traZODone (DESYREL) 50 MG tablet TAKE (1) TABLET BY MOUTH AT BEDTIME. 04/08/19  Yes Kathrynn Ducking, MD  TURMERIC PO Take 1,000 mg by mouth daily with supper. Take twice a day   Yes [provider]    ROS:  Out of a complete 14 system review of symptoms, the patient complains only of the following symptoms, and all other reviewed systems are negative.  Generalized fatigue Double vision Walking difficulty  Blood pressure 124/72, pulse 68, temperature (!) 97.5 F (36.4 C), height 5\' 3"  (1.6 m), weight 186 lb 3.2 oz (84.5 kg).  Physical Exam  General: The patient is alert and cooperative at the time of the examination.  Skin: No significant peripheral edema is noted.   Neurologic Exam  Mental status: The patient is alert and oriented x 3 at the time of the examination. The  patient has apparent normal recent and remote memory, with an apparently normal attention span and concentration ability.   Cranial nerves: Facial symmetry is present. Speech is normal, no aphasia or dysarthria is noted. Extraocular movements are full. Visual fields are full.  With superior gaze for 1 minute there is no ptosis seen, the patient has some medial deviation of the right eye relative to the left.  Motor: The patient has good strength in all 4 extremities.  With arms outstretched for 1 minute, no fatigable weakness of the deltoid muscles is seen.  Sensory examination: Soft touch sensation is symmetric on the face, arms, and legs.  Coordination: The patient has good finger-nose-finger and heel-to-shin bilaterally.  Gait and station: The patient has a slightly wide-based gait, the patient is able to walk independently but usually uses a cane.  Tandem gait is not attempted.  Romberg is negative.  Reflexes: Deep tendon reflexes are symmetric.   Assessment/Plan:  1.  Ocular myasthenia gravis  2.  Reports of generalized fatigue, dyspnea on exertion  The patient be placed on low-dose prednisone 10 mg daily.  She will have blood work done today.  She will remain on her current dose of CellCept.  We will follow the symptoms of fatigue and shortness of breath over time.  She will come back in about 6 months.  Jill Alexanders MD 05/22/2019 11:33 AM  [facility] [address] [place], [place] [address]  Phone [phone] Fax [phone]

## 2019-05-23 LAB — COMPREHENSIVE METABOLIC PANEL
ALT: 9 IU/L (ref 0–32)
AST: 17 IU/L (ref 0–40)
Albumin/Globulin Ratio: 1.6 (ref 1.2–2.2)
Albumin: 4.3 g/dL (ref 3.7–4.7)
Alkaline Phosphatase: 53 IU/L (ref 39–117)
BUN/Creatinine Ratio: 16 (ref 12–28)
BUN: 18 mg/dL (ref 8–27)
Bilirubin Total: 0.5 mg/dL (ref 0.0–1.2)
CO2: 21 mmol/L (ref 20–29)
Calcium: 10.2 mg/dL (ref 8.7–10.3)
Chloride: 106 mmol/L (ref 96–106)
Creatinine, Ser: 1.15 mg/dL — ABNORMAL HIGH (ref 0.57–1.00)
GFR calc Af Amer: 52 mL/min/{1.73_m2} — ABNORMAL LOW (ref >59)
GFR calc non Af Amer: 45 mL/min/{1.73_m2} — ABNORMAL LOW (ref >59)
Globulin, Total: 2.7 g/dL (ref 1.5–4.5)
Glucose: 95 mg/dL (ref 65–99)
Potassium: 4.3 mmol/L (ref 3.5–5.2)
Sodium: 142 mmol/L (ref 134–144)
Total Protein: 7 g/dL (ref 6.0–8.5)

## 2019-05-23 LAB — CBC WITH DIFFERENTIAL/PLATELET
Basophils Absolute: 0 10*3/uL (ref 0.0–0.2)
Basos: 1 %
EOS (ABSOLUTE): 0.1 10*3/uL (ref 0.0–0.4)
Eos: 1 %
Hematocrit: 42.7 % (ref 34.0–46.6)
Hemoglobin: 14.2 g/dL (ref 11.1–15.9)
Immature Grans (Abs): 0 10*3/uL (ref 0.0–0.1)
Immature Granulocytes: 0 %
Lymphocytes Absolute: 2.3 10*3/uL (ref 0.7–3.1)
Lymphs: 30 %
MCH: 32.8 pg (ref 26.6–33.0)
MCHC: 33.3 g/dL (ref 31.5–35.7)
MCV: 99 fL — ABNORMAL HIGH (ref 79–97)
Monocytes Absolute: 0.6 10*3/uL (ref 0.1–0.9)
Monocytes: 8 %
Neutrophils Absolute: 4.8 10*3/uL (ref 1.4–7.0)
Neutrophils: 60 %
Platelets: 184 10*3/uL (ref 150–450)
RBC: 4.33 x10E6/uL (ref 3.77–5.28)
RDW: 12.2 % (ref 11.7–15.4)
WBC: 7.8 10*3/uL (ref 3.4–10.8)

## 2019-05-26 ENCOUNTER — Ambulatory Visit (HOSPITAL_COMMUNITY): Payer: Medicare Other

## 2019-05-30 ENCOUNTER — Other Ambulatory Visit: Payer: Self-pay | Admitting: Internal Medicine

## 2019-05-30 DIAGNOSIS — I482 Chronic atrial fibrillation, unspecified: Secondary | ICD-10-CM | POA: Diagnosis not present

## 2019-05-30 DIAGNOSIS — E782 Mixed hyperlipidemia: Secondary | ICD-10-CM | POA: Diagnosis not present

## 2019-05-30 DIAGNOSIS — G894 Chronic pain syndrome: Secondary | ICD-10-CM | POA: Diagnosis not present

## 2019-05-30 DIAGNOSIS — E039 Hypothyroidism, unspecified: Secondary | ICD-10-CM | POA: Diagnosis not present

## 2019-05-30 DIAGNOSIS — G7000 Myasthenia gravis without (acute) exacerbation: Secondary | ICD-10-CM | POA: Diagnosis not present

## 2019-05-30 DIAGNOSIS — I10 Essential (primary) hypertension: Secondary | ICD-10-CM | POA: Diagnosis not present

## 2019-05-30 DIAGNOSIS — M25511 Pain in right shoulder: Secondary | ICD-10-CM | POA: Diagnosis not present

## 2019-05-30 DIAGNOSIS — G4700 Insomnia, unspecified: Secondary | ICD-10-CM | POA: Diagnosis not present

## 2019-05-30 DIAGNOSIS — N644 Mastodynia: Secondary | ICD-10-CM | POA: Diagnosis not present

## 2019-05-30 DIAGNOSIS — I4891 Unspecified atrial fibrillation: Secondary | ICD-10-CM | POA: Diagnosis not present

## 2019-05-30 DIAGNOSIS — N183 Chronic kidney disease, stage 3 unspecified: Secondary | ICD-10-CM | POA: Diagnosis not present

## 2019-05-30 DIAGNOSIS — F331 Major depressive disorder, recurrent, moderate: Secondary | ICD-10-CM | POA: Diagnosis not present

## 2019-06-03 DIAGNOSIS — G4700 Insomnia, unspecified: Secondary | ICD-10-CM | POA: Diagnosis not present

## 2019-06-03 DIAGNOSIS — Z950 Presence of cardiac pacemaker: Secondary | ICD-10-CM | POA: Diagnosis not present

## 2019-06-03 DIAGNOSIS — G894 Chronic pain syndrome: Secondary | ICD-10-CM | POA: Diagnosis not present

## 2019-06-03 DIAGNOSIS — I482 Chronic atrial fibrillation, unspecified: Secondary | ICD-10-CM | POA: Diagnosis not present

## 2019-06-03 DIAGNOSIS — F331 Major depressive disorder, recurrent, moderate: Secondary | ICD-10-CM | POA: Diagnosis not present

## 2019-06-03 DIAGNOSIS — K219 Gastro-esophageal reflux disease without esophagitis: Secondary | ICD-10-CM | POA: Diagnosis not present

## 2019-06-03 DIAGNOSIS — I129 Hypertensive chronic kidney disease with stage 1 through stage 4 chronic kidney disease, or unspecified chronic kidney disease: Secondary | ICD-10-CM | POA: Diagnosis not present

## 2019-06-03 DIAGNOSIS — E782 Mixed hyperlipidemia: Secondary | ICD-10-CM | POA: Diagnosis not present

## 2019-06-03 DIAGNOSIS — E039 Hypothyroidism, unspecified: Secondary | ICD-10-CM | POA: Diagnosis not present

## 2019-06-03 DIAGNOSIS — Z79891 Long term (current) use of opiate analgesic: Secondary | ICD-10-CM | POA: Diagnosis not present

## 2019-06-03 DIAGNOSIS — G7000 Myasthenia gravis without (acute) exacerbation: Secondary | ICD-10-CM | POA: Diagnosis not present

## 2019-06-03 DIAGNOSIS — Z0001 Encounter for general adult medical examination with abnormal findings: Secondary | ICD-10-CM | POA: Diagnosis not present

## 2019-06-09 DIAGNOSIS — Z23 Encounter for immunization: Secondary | ICD-10-CM | POA: Diagnosis not present

## 2019-06-12 ENCOUNTER — Other Ambulatory Visit: Payer: Self-pay | Admitting: Internal Medicine

## 2019-06-12 NOTE — Progress Notes (Signed)
Cardiology Office Note   Date:  06/15/2019   ID:  [name], DOB 15-Aug-1940, MRN [id number]  PCP:  [name], MD  Cardiologist:  Dr. [name]  EP Dr. Lovena Le     Chief Complaint  Patient presents with  . Shortness of Breath      History of Present Illness: [name] is a 79 y.o. female retired [employer] scientist who presents for SOB.  Past medical history of paroxysmal atrial fibrillation (on Eliquis), tachy-brady syndrome (s/p [employer] PPM placement in 06/2017), HTN, paraganglioma, and ocular myasthenia gravis who presents to the office today for follow-up of her recent cardiac catheterization.  She was last examined by Dr. [name] in 10/2018 and reported exertional chest pain and dyspnea on exertion.  Reported previously been able to walk 25 laps around her driveway but was only able to walk 5 laps at that time. Given this along with her cardiac risk factors including family history of CAD, a cardiac catheterization was recommended for definitive evaluation. This was performed by Dr. Tamala Julian on 11/18/2018 and showed normal LM, RCA, LCx and LAD with first diagonal containing 40% stenosis.  LV function and hemodynamics were normal with normal pulmonary artery pressures as well. Symptoms were thought to not be consistent with a cardiac origin.  Last visit 12/13/18 she reports having baseline dyspnea on exertion but denies any associated exertional chest pain. She does describe an aching sensation along the center of her chest which is typically worse with coughing. Says that this has significantly improved since her effusions have resolved.  No association of her pain with exertion. Sometimes worse with inspiration. She denies any recent orthopnea, PND, or lower extremity edema. No palpitations, dizziness, or presyncope.    She tried short course NSAIDS for chest pain - ppm placed 2019 MDT. Dr. Lovena Le Recent labs stable.   Today she has DOE, we reviewed cardiac cath and doubt she  would have had significant increase of her CAD.  Also evaluated her last echo. No arrhythmias on pacer eval.  No angina.  Neuro does not believe this is related to Myasthenia.  No palpitations.    Past Medical History:  Diagnosis Date  . Anemia   . Chronic back pain   . Chronic nausea   . Complication of anesthesia    Low blood pressure, heart rate, O2 sat  . Depression with anxiety   . Depression with anxiety   . Diverticulosis   . Dyspnea   . Essential hypertension   . GERD (gastroesophageal reflux disease)   . History of pneumonia   . Hypothyroidism   . IBS (irritable colon syndrome)   . LVH (left ventricular hypertrophy)   . Migraines   . Mixed hyperlipidemia   . Obesity   . Ocular myasthenia gravis ([place])   . Osteoarthritis of knee   . PAF (paroxysmal atrial fibrillation) ([facility])    chads2vasc score is at least 4  . Paraganglioma ([place])    Resection 02/2012 (retroperitoneal)  . Paraganglioma, malignant ([place]) 11/06/2015  . Presence of permanent cardiac pacemaker 07/18/2017  . Renal insufficiency   . Skin cancer     Past Surgical History:  Procedure Laterality Date  . ABDOMINAL HYSTERECTOMY    . ABDOMINAL HYSTERECTOMY    . CARDIAC CATHETERIZATION    . CHOLECYSTECTOMY    . COLONOSCOPY  08/24/2011   Procedure: COLONOSCOPY;  Surgeon: Rogene Houston, MD;  Location: [facility];  Service: Endoscopy;  Laterality: N/A;  1200  . CT  guided biopsy  01/16/12  . GIVENS CAPSULE STUDY  01/29/2012   Procedure: GIVENS CAPSULE STUDY;  Surgeon: Rogene Houston, MD;  Location: [facility];  Service: Endoscopy;  Laterality: N/A;  730  . KNEE SURGERY    . LAPAROTOMY  03/01/2012   Procedure: EXPLORATORY LAPAROTOMY;  Surgeon: Earnstine Regal, MD;  Location: [facility];  Service: General;  Laterality: N/A;  Exploratory Laparotomy ,Resection Retroperitoneal Mass  . NASAL SINUS SURGERY     30 yrs ago  . PACEMAKER IMPLANT N/A 07/18/2017   Procedure: PACEMAKER IMPLANT;  Surgeon: Evans Lance, MD;   Location: [facility];  Service: Cardiovascular;  Laterality: N/A;  . RIGHT/LEFT HEART CATH AND CORONARY ANGIOGRAPHY N/A 11/18/2018   Procedure: RIGHT/LEFT HEART CATH AND CORONARY ANGIOGRAPHY;  Surgeon: Belva Crome, MD;  Location: [facility];  Service: Cardiovascular;  Laterality: N/A;  . TOTAL KNEE ARTHROPLASTY Bilateral    2005 and 2011     Current Outpatient Medications  Medication Sig Dispense Refill  . ALPRAZolam (XANAX) 1 MG tablet Take 1 mg by mouth 2 (two) times daily as needed.     Marland Kitchen apixaban (ELIQUIS) 5 MG TABS tablet Eliquis 5 mg tablet    . Artificial Tear Solution (SOOTHE XP OP) Place 1 drop into both eyes 4 (four) times daily.    Marland Kitchen atenolol (TENORMIN) 25 MG tablet TAKE (1) TABLET BY MOUTH ONCE DAILY. MAY TAKE AN ADDITIONAL TABLET IF SYSTOLIC OVER [id number] OR SEVERE PALPITATIONS. 120 tablet 2  . B Complex-C (B-COMPLEX WITH VITAMIN C) tablet Take 1 tablet by mouth daily with supper.    Marland Kitchen buPROPion (WELLBUTRIN SR) 150 MG 12 hr tablet Take 150 mg by mouth daily.    . cholecalciferol (VITAMIN D) 1000 units tablet Take 1,000 Units by mouth 2 (two) times a day.     . Coenzyme Q10 (COQ10) 100 MG CAPS Take 100 mg by mouth daily.    . diclofenac sodium (VOLTAREN) 1 % GEL Apply 4 g topically 4 (four) times daily as needed (for pain).     Marland Kitchen ELIQUIS 5 MG TABS tablet TAKE (1) TABLET BY MOUTH TWICE DAILY. 180 tablet 1  . furosemide (LASIX) 20 MG tablet     . ketoconazole (NIZORAL) 2 % cream Apply 1 application topically 2 (two) times daily. applied under breasts    . lansoprazole (PREVACID) 30 MG capsule TAKE (1) CAPSULE BY MOUTH TWICE DAILY. 180 capsule 1  . levothyroxine (SYNTHROID) 112 MCG tablet Take 112 mcg by mouth daily before breakfast.    . lidocaine (LIDODERM) 5 % Place 1-3 patches onto the skin daily as needed (for pain). Remove & Discard patch within 12 hours or as directed by MD for pain    . mycophenolate (CELLCEPT) 500 MG tablet Take 1 tablet (500 mg total) by mouth  2 (two) times daily. 180 tablet 1  . oxyCODONE-acetaminophen (PERCOCET) 10-325 MG tablet Take 1 tablet by mouth every 6 (six) hours as needed (pain).     . potassium chloride (KLOR-CON) 10 MEQ tablet     . pravastatin (PRAVACHOL) 80 MG tablet Take 80 mg by mouth daily with supper.     . predniSONE (DELTASONE) 5 MG tablet Take 2 tablets (10 mg total) by mouth daily with breakfast. 180 tablet 1  . pyridostigmine (MESTINON) 60 MG tablet TAKE (1/2) TABLET BY MOUTH FOUR TIMES DAILY. 180 tablet 0  . SUMAtriptan (IMITREX) 50 MG tablet Take 50 mg by mouth every 2 (two)  hours as needed. For migraines    . Terbinafine 1 % GEL Apply 1 application topically 2 (two) times daily as needed (toe nail fungus).     . traZODone (DESYREL) 50 MG tablet TAKE (1) TABLET BY MOUTH AT BEDTIME. 90 tablet 0  . TURMERIC PO Take 1,000 mg by mouth daily with supper. Take twice a day     Current Facility-Administered Medications  Medication Dose Route Frequency Provider Last Rate Last Admin  . sodium chloride flush (NS) 0.9 % injection 3 mL  3 mL Intravenous Q12H Herminio Commons, MD        Allergies:   Nitrofurantoin macrocrystal, Ampicillin, Avocado, Biaxin [clarithromycin], Doxycycline monohydrate, Moxifloxacin, and Tetracyclines & related    Social History:  The patient  reports that she has never smoked. She has never used smokeless tobacco. She reports that she does not drink alcohol or use drugs.   Family History:  The patient's family history includes Inflammatory bowel disease in her maternal grandmother.    ROS:  General:no colds or fevers, no weight changes Skin:no rashes or ulcers HEENT:no blurred vision, no congestion CV:see HPI PUL:see HPI GI:no diarrhea constipation or melena, no indigestion GU:no hematuria, no dysuria MS:no joint pain, no claudication Neuro:no syncope, no lightheadedness Endo:no diabetes, + thyroid disease   Wt Readings from Last 3 Encounters:  06/13/19 186 lb 12.8 oz (84.7  kg)  05/22/19 186 lb 3.2 oz (84.5 kg)  03/10/19 187 lb (84.8 kg)     PHYSICAL EXAM: VS:  BP 119/82   Pulse 100   Temp 98.8 F (37.1 C)   Ht 5\' 3"  (1.6 m)   Wt 186 lb 12.8 oz (84.7 kg)   SpO2 93%   BMI 33.09 kg/m  , BMI Body mass index is 33.09 kg/m. General:Pleasant affect, NAD Skin:Warm and dry, brisk capillary refill HEENT:normocephalic, sclera clear, mucus membranes moist Neck:supple, no JVD, no bruits  Heart:S1S2 RRR without murmur, gallup, rub or click Lungs:clear without rales, rhonchi, or wheezes [id number], non tender, + BS, do not palpate liver spleen or masses Ext:no lower ext edema, 2+ pedal pulses, 2+ radial pulses Neuro:alert and oriented X 3, MAE, follows commands, + facial symmetry    EKG:  EKG is NOT ordered today. The ekg ordered today demonstrates .   Recent Labs: 05/22/2019: ALT 9; BUN 18; Creatinine, Ser 1.15; Hemoglobin 14.2; Platelets 184; Potassium 4.3; Sodium 142    Lipid Panel No results found for: CHOL, TRIG, HDL, CHOLHDL, VLDL, LDLCALC, LDLDIRECT     Other studies Reviewed: Additional studies/ records that were reviewed today include: . Cardiac cath 11/18/18  1st Diag lesion is 40% stenosed.  Hemodynamic findings consistent with pulmonary hypertension.  LV end diastolic pressure is normal.    Normal left ventricular function and hemodynamics.  Normal right heart pressures and oximetry.  Right dominant coronary anatomy  Normal left main, RCA, circumflex, and LAD.  First diagonal contains proximal 40% narrowing.  RECOMMENDATIONS:   Coronary artery, LV function, and pulmonary artery pressures are all normal.    Symptoms do not appear to be of cardiac origin.  Echo 09/19/17 Study Conclusions   - Left ventricle: The cavity size was normal. Wall thickness was  normal. Systolic function was normal. The estimated ejection  fraction was in the range of 60% to 65%. Wall motion was normal;  there were no regional wall motion  abnormalities. Doppler  parameters are consistent with abnormal left ventricular  relaxation (grade 1 diastolic dysfunction). The E/e&' ratio is  between 8-15, suggesting indeterminate LV filling pressure.  - Left atrium: Moderately dilated.  - Inferior vena cava: The vessel was normal in size. The  respirophasic diameter changes were in the normal range (>= 50%),  consistent with normal central venous pressure.   Impressions:   - Compared to a prior study in 2018, there are no significant  changes.    ASSESSMENT AND PLAN:  1.  SOB with exertion, cardiac cath 10/2018 with 1st diag with 40% stenosis, otherwise patent Coronary arteries.  Will check CXR  Hx of pl effusion and echo for further eval.  She has seen Neuro and he did not believe to be due to myasthenia. Follow up in 2-3 weeks for results. Reviewed recent outpt labs from PCP  2.  She has had both covid vaccines. Moderna.    3.  Sinus node dysfunction with PPM MDT followed by Dr. Lovena Le   4.  PAF though none seen on last interrogation in Feb 2021 and no palpitations   5.  HTN stable today, no change in meds.    Current medicines are reviewed with the patient today.  The patient Has no concerns regarding medicines.  The following changes have been made:  See above Labs/ tests ordered today include:see above  Disposition:   FU:  see above  Signed, Cecilie Kicks, NP  06/15/2019 4:58 PM    [facility] [place], [place], [place], [place] Phone: [phone]; Fax: [phone]

## 2019-06-13 ENCOUNTER — Other Ambulatory Visit: Payer: Self-pay | Admitting: Neurology

## 2019-06-13 ENCOUNTER — Other Ambulatory Visit: Payer: Self-pay

## 2019-06-13 ENCOUNTER — Ambulatory Visit (INDEPENDENT_AMBULATORY_CARE_PROVIDER_SITE_OTHER): Payer: Medicare Other | Admitting: Cardiology

## 2019-06-13 ENCOUNTER — Encounter: Payer: Self-pay | Admitting: Cardiology

## 2019-06-13 ENCOUNTER — Other Ambulatory Visit: Payer: Self-pay | Admitting: *Deleted

## 2019-06-13 VITALS — BP 119/82 | HR 100 | Temp 98.8°F | Ht 63.0 in | Wt 186.8 lb

## 2019-06-13 DIAGNOSIS — I495 Sick sinus syndrome: Secondary | ICD-10-CM | POA: Diagnosis not present

## 2019-06-13 DIAGNOSIS — I480 Paroxysmal atrial fibrillation: Secondary | ICD-10-CM

## 2019-06-13 DIAGNOSIS — I10 Essential (primary) hypertension: Secondary | ICD-10-CM | POA: Diagnosis not present

## 2019-06-13 DIAGNOSIS — R0602 Shortness of breath: Secondary | ICD-10-CM

## 2019-06-13 DIAGNOSIS — Z8709 Personal history of other diseases of the respiratory system: Secondary | ICD-10-CM

## 2019-06-13 NOTE — Patient Instructions (Addendum)
Medication Instructions:   Your physician recommends that you continue on your current medications as directed. Please refer to the Current Medication list given to you today.  Labwork:  NONE  Testing/Procedures:  A chest x-ray takes a picture of the organs and structures inside the chest, including the heart, lungs, and blood vessels. This test can show several things, including, whether the heart is enlarges; whether fluid is building up in the lungs; and whether pacemaker / defibrillator leads are still in place. Your physician has requested that you have an echocardiogram. Echocardiography is a painless test that uses sound waves to create images of your heart. It provides your doctor with information about the size and shape of your heart and how well your heart's chambers and valves are working. This procedure takes approximately one hour. There are no restrictions for this procedure.  Follow-Up:  Your physician recommends that you schedule a follow-up appointment in: 3 weeks (office).  Any Other Special Instructions Will Be Listed Below (If Applicable).  If you need a refill on your cardiac medications before your next appointment, please call your pharmacy.

## 2019-06-13 NOTE — Telephone Encounter (Signed)
Eliquis 5mg  refill request received. Patient is 79 years old, weight-84.5kg, Crea-1.15 on 06/01/2019, Diagnosis-Afib, and last seen by [place] on 12/13/2018. Dose is appropriate based on dosing criteria. Will send in refill to requested pharmacy.

## 2019-06-15 ENCOUNTER — Encounter: Payer: Self-pay | Admitting: Cardiology

## 2019-06-16 ENCOUNTER — Other Ambulatory Visit: Payer: Self-pay

## 2019-06-16 ENCOUNTER — Ambulatory Visit (HOSPITAL_COMMUNITY)
Admission: RE | Admit: 2019-06-16 | Discharge: 2019-06-16 | Disposition: A | Discharge status: Home / Self Care | Payer: Medicare Other | Source: Ambulatory Visit | Attending: Cardiology | Admitting: Cardiology

## 2019-06-16 DIAGNOSIS — Z8709 Personal history of other diseases of the respiratory system: Secondary | ICD-10-CM | POA: Diagnosis not present

## 2019-06-16 DIAGNOSIS — R0602 Shortness of breath: Secondary | ICD-10-CM | POA: Diagnosis not present

## 2019-06-17 ENCOUNTER — Other Ambulatory Visit: Payer: Self-pay | Admitting: Neurology

## 2019-06-23 ENCOUNTER — Ambulatory Visit (HOSPITAL_COMMUNITY): Payer: Medicare Other

## 2019-06-24 ENCOUNTER — Other Ambulatory Visit: Payer: Self-pay

## 2019-06-24 ENCOUNTER — Ambulatory Visit (HOSPITAL_COMMUNITY)
Admission: RE | Admit: 2019-06-24 | Discharge: 2019-06-24 | Disposition: A | Discharge status: Home / Self Care | Payer: Medicare Other | Source: Ambulatory Visit | Attending: Cardiology | Admitting: Cardiology

## 2019-06-24 DIAGNOSIS — R0602 Shortness of breath: Secondary | ICD-10-CM | POA: Insufficient documentation

## 2019-06-24 NOTE — Progress Notes (Signed)
*  PRELIMINARY RESULTS* Echocardiogram 2D Echocardiogram has been performed.  Leavy Cella 06/24/2019, 11:55 AM

## 2019-06-25 NOTE — Progress Notes (Signed)
Cardiology Office Note    Date:  07/01/2019   ID:  [name], DOB 01/31/1941, MRN [id number]  PCP:  [name], MD  Cardiologist: Kate Sable, MD EPS: None  Chief Complaint  Patient presents with  . Follow-up    History of Present Illness:  [name] is a 79 y.o. female with past medical history of paroxysmal atrial fibrillation (on Eliquis), tachy-brady syndrome (s/p Medtronic PPM placement in 06/2017), HTN, paraganglioma, and myasthenia gravis, chronic dyspnea on exertion and cath 11/18/18 normal LM, RCA, LCx and LAD with first diagonal containing 40% stenosis.  LV function and hemodynamics were normal with normal pulmonary artery pressures as well. Symptoms were thought to not be consistent with a cardiac origin. Normal pacer function 04/2019.   Saw Cecilie Kicks 06/13/19 and complaining of shortness of breath. Echo 06/24/19 normal LVEF, grade 1 DD, CXR 06/16/19 no active disease.  Patient comes in accompanied by her daughter. She is doing better on steroids-helped her breathing, appetite, getting out in the yard. Echo and CXR reviewed with them.     Past Medical History:  Diagnosis Date  . Anemia   . Chronic back pain   . Chronic nausea   . Complication of anesthesia    Low blood pressure, heart rate, O2 sat  . Depression with anxiety   . Depression with anxiety   . Diverticulosis   . Dyspnea   . Essential hypertension   . GERD (gastroesophageal reflux disease)   . History of pneumonia   . Hypothyroidism   . IBS (irritable colon syndrome)   . LVH (left ventricular hypertrophy)   . Migraines   . Mixed hyperlipidemia   . Obesity   . Ocular myasthenia gravis ([place])   . Osteoarthritis of knee   . PAF (paroxysmal atrial fibrillation) (HCC)    chads2vasc score is at least 4  . Paraganglioma ([place])    Resection 02/2012 (retroperitoneal)  . Paraganglioma, malignant ([place]) 11/06/2015  . Presence of permanent cardiac pacemaker 07/18/2017  . Renal insufficiency     . Skin cancer     Past Surgical History:  Procedure Laterality Date  . ABDOMINAL HYSTERECTOMY    . ABDOMINAL HYSTERECTOMY    . CARDIAC CATHETERIZATION    . CHOLECYSTECTOMY    . COLONOSCOPY  08/24/2011   Procedure: COLONOSCOPY;  Surgeon: Rogene Houston, MD;  [facility]: [facility];  Service: Endoscopy;  Laterality: N/A;  1200  . CT guided biopsy  01/16/12  . GIVENS CAPSULE STUDY  01/29/2012   Procedure: GIVENS CAPSULE STUDY;  Surgeon: Rogene Houston, MD;  Location: [facility];  Service: Endoscopy;  Laterality: N/A;  730  . KNEE SURGERY    . LAPAROTOMY  03/01/2012   Procedure: EXPLORATORY LAPAROTOMY;  Surgeon: Earnstine Regal, MD;  Location: [facility];  Service: General;  Laterality: N/A;  Exploratory Laparotomy ,Resection Retroperitoneal Mass  . NASAL SINUS SURGERY     30 yrs ago  . PACEMAKER IMPLANT N/A 07/18/2017   Procedure: PACEMAKER IMPLANT;  Surgeon: Evans Lance, MD;  Location: [facility];  Service: Cardiovascular;  Laterality: N/A;  . RIGHT/LEFT HEART CATH AND CORONARY ANGIOGRAPHY N/A 11/18/2018   Procedure: RIGHT/LEFT HEART CATH AND CORONARY ANGIOGRAPHY;  Surgeon: Belva Crome, MD;  Location: [facility];  Service: Cardiovascular;  Laterality: N/A;  . TOTAL KNEE ARTHROPLASTY Bilateral    2005 and 2011    Current Medications: Current Meds  Medication Sig  . ALPRAZolam Duanne Moron)  1 MG tablet Take 1 mg by mouth 2 (two) times daily as needed.   Marland Kitchen apixaban (ELIQUIS) 5 MG TABS tablet Eliquis 5 mg tablet  . Artificial Tear Solution (SOOTHE XP OP) Place 1 drop into both eyes 4 (four) times daily.  Marland Kitchen atenolol (TENORMIN) 25 MG tablet TAKE (1) TABLET BY MOUTH ONCE DAILY. MAY TAKE AN ADDITIONAL TABLET IF SYSTOLIC OVER [id number] OR SEVERE PALPITATIONS.  . B Complex-C (B-COMPLEX WITH VITAMIN C) tablet Take 1 tablet by mouth daily with supper.  Marland Kitchen buPROPion (WELLBUTRIN SR) 150 MG 12 hr tablet Take 150 mg by mouth daily.  . cholecalciferol (VITAMIN D) 1000 units tablet Take 1,000  Units by mouth 2 (two) times a day.   . Coenzyme Q10 (COQ10) 100 MG CAPS Take 100 mg by mouth daily.  . diclofenac sodium (VOLTAREN) 1 % GEL Apply 4 g topically 4 (four) times daily as needed (for pain).   Marland Kitchen ELIQUIS 5 MG TABS tablet TAKE (1) TABLET BY MOUTH TWICE DAILY.  . furosemide (LASIX) 20 MG tablet   . ketoconazole (NIZORAL) 2 % cream Apply 1 application topically 2 (two) times daily. applied under breasts  . lansoprazole (PREVACID) 30 MG capsule TAKE (1) CAPSULE BY MOUTH TWICE DAILY.  Marland Kitchen levothyroxine (SYNTHROID) 112 MCG tablet Take 112 mcg by mouth daily before breakfast.  . lidocaine (LIDODERM) 5 % Place 1-3 patches onto the skin daily as needed (for pain). Remove & Discard patch within 12 hours or as directed by MD for pain  . mycophenolate (CELLCEPT) 500 MG tablet Take 1 tablet (500 mg total) by mouth 2 (two) times daily.  Marland Kitchen oxyCODONE-acetaminophen (PERCOCET) 10-325 MG tablet Take 1 tablet by mouth every 6 (six) hours as needed (pain).   . potassium chloride (KLOR-CON) 10 MEQ tablet   . pravastatin (PRAVACHOL) 80 MG tablet Take 80 mg by mouth daily with supper.   . predniSONE (DELTASONE) 5 MG tablet Take 2 tablets (10 mg total) by mouth daily with breakfast.  . predniSONE (DELTASONE) 5 MG tablet Take by mouth.  . pyridostigmine (MESTINON) 60 MG tablet TAKE (1/2) TABLET BY MOUTH FOUR TIMES DAILY.  . SUMAtriptan (IMITREX) 50 MG tablet Take 50 mg by mouth every 2 (two) hours as needed. For migraines  . Terbinafine 1 % GEL Apply 1 application topically 2 (two) times daily as needed (toe nail fungus).   . traZODone (DESYREL) 50 MG tablet TAKE (1) TABLET BY MOUTH AT BEDTIME.  . traZODone (DESYREL) 50 MG tablet TAKE (1) TABLET BY MOUTHUAT BEDTIME.  Marland Kitchen TURMERIC PO Take 1,000 mg by mouth daily with supper. Take twice a day   Current Facility-Administered Medications for the 07/01/19 encounter (Office Visit) with Imogene Burn, PA-C  Medication  . sodium chloride flush (NS) 0.9 % injection 3  mL     Allergies:   Nitrofurantoin macrocrystal, Ampicillin, Avocado, Biaxin [clarithromycin], Doxycycline monohydrate, Moxifloxacin, and Tetracyclines & related   Social History   Socioeconomic History  . Marital status: Widowed    Spouse name: Not on file  . Number of children: Not on file  . Years of education: Not on file  . Highest education level: Not on file  Occupational History  . Occupation: Retired Quarry manager  Tobacco Use  . Smoking status: Never Smoker  . Smokeless tobacco: Never Used  Substance and Sexual Activity  . Alcohol use: No    Alcohol/week: 0.0 standard drinks  . Drug use: No  . Sexual activity: Not on file  Other Topics  Concern  . Not on file  Social History Narrative   Lives in [place] alone.  Retired Quarry manager.   Caffeine use:    Right handed   Social Determinants of Health   Financial Resource Strain:   . Difficulty of Paying Living Expenses:   Food Insecurity:   . Worried About Charity fundraiser in the Last Year:   . Arboriculturist in the Last Year:   Transportation Needs:   . Film/video editor (Medical):   Marland Kitchen Lack of Transportation (Non-Medical):   Physical Activity:   . Days of Exercise per Week:   . Minutes of Exercise per Session:   Stress:   . Feeling of Stress :   Social Connections:   . Frequency of Communication with Friends and Family:   . Frequency of Social Gatherings with Friends and Family:   . Attends Religious Services:   . Active Member of Clubs or Organizations:   . Attends Archivist Meetings:   Marland Kitchen Marital Status:      Family History:  The patient's   family history includes Inflammatory bowel disease in her maternal grandmother.   ROS:   Please see the history of present illness.    ROS All other systems reviewed and are negative.   PHYSICAL EXAM:   VS:  BP 114/66   Pulse 60   Temp 97.9 F (36.6 C)   Ht 5\' 4"  (1.626 m)   Wt 185 lb (83.9 kg)   SpO2 97%   BMI 31.76 kg/m   Physical  Exam  GEN: Well nourished, well developed, in no acute distress  Neck: no JVD, carotid bruits, or masses Cardiac:RRR; no murmurs, rubs, or gallops  Respiratory:  clear to auscultation bilaterally, normal work of breathing GI: soft, nontender, nondistended, + BS Ext: without cyanosis, clubbing, or edema, Good distal pulses bilaterally Neuro:  Alert and Oriented x 3 Psych: euthymic mood, full affect  Wt Readings from Last 3 Encounters:  07/01/19 185 lb (83.9 kg)  06/13/19 186 lb 12.8 oz (84.7 kg)  05/22/19 186 lb 3.2 oz (84.5 kg)      Studies/Labs Reviewed:   EKG:  EKG is not ordered today.  Recent Labs: 05/22/2019: ALT 9; BUN 18; Creatinine, Ser 1.15; Hemoglobin 14.2; Platelets 184; Potassium 4.3; Sodium 142   Lipid Panel No results found for: CHOL, TRIG, HDL, CHOLHDL, VLDL, LDLCALC, LDLDIRECT  Additional studies/ records that were reviewed today include:    Echo 06/24/19 IMPRESSIONS     1. Left ventricular ejection fraction, by estimation, is 60 to 65%. The  left ventricle has normal function. The left ventricle has no regional  wall motion abnormalities. There is mild left ventricular hypertrophy.  Left ventricular diastolic parameters  are consistent with Grade I diastolic dysfunction (impaired relaxation).   2. Right ventricular systolic function is normal. The right ventricular  size is normal. There is normal pulmonary artery systolic pressure.   3. The mitral valve is normal in structure. Trivial mitral valve  regurgitation. No evidence of mitral stenosis.   4. The aortic valve is tricuspid. Aortic valve regurgitation is not  visualized. No aortic stenosis is present.   5. The inferior vena cava is normal in size with greater than 50%  respiratory variability, suggesting right atrial pressure of 3 mmHg.   FINDINGS   Left Ventricle: Left ventricular ejection fraction, by estimation, is 60  to 65%. The left ventricle has normal function. The left ventricle has no  regional wall motion abnormalities. The left ventricular internal cavity  size was normal in size. There is   mild left ventricular hypertrophy. Left ventricular diastolic parameters  are consistent with Grade I diastolic dysfunction (impaired relaxation).   Right Ventricle: The right ventricular size is normal. No increase in  right ventricular wall thickness. Right ventricular systolic function is  normal. There is normal pulmonary artery systolic pressure. The tricuspid  regurgitant velocity is 1.53 m/s, and   with an assumed right atrial pressure of 10 mmHg, the estimated right  ventricular systolic pressure is [id number] mmHg.   Left Atrium: Left atrial size was normal in size.   Right Atrium: Right atrial size was normal in size.   Pericardium: There is no evidence of pericardial effusion.   Mitral Valve: The mitral valve is normal in structure. Trivial mitral  valve regurgitation. No evidence of mitral valve stenosis.   Tricuspid Valve: The tricuspid valve is normal in structure. Tricuspid  valve regurgitation is trivial. No evidence of tricuspid stenosis.   Aortic Valve: The aortic valve is tricuspid. . There is mild thickening  and mild calcification of the aortic valve. Aortic valve regurgitation is  not visualized. No aortic stenosis is present. Mild aortic valve annular  calcification. There is mild  thickening of the aortic valve. There is mild calcification of the aortic  valve. Aortic valve mean gradient measures 3.5 mmHg. Aortic valve peak  gradient measures 1.7 mmHg. Aortic valve area, by VTI measures 2.40 cm.   Pulmonic Valve: The pulmonic valve was not well visualized. Pulmonic valve  regurgitation is not visualized. No evidence of pulmonic stenosis.   Aorta: The aortic root is normal in size and structure.   Venous: The inferior vena cava is normal in size with greater than 50%  respiratory variability, suggesting right atrial pressure of 3 mmHg.   IAS/Shunts:  No atrial level shunt detected by color flow Doppler.   Additional Comments: A pacer wire is visualized.     ASSESSMENT:    1. DOE (dyspnea on exertion)   2. Cardiac pacemaker in situ   3. PAF (paroxysmal atrial fibrillation) ([place])   4. Essential hypertension   5. Myasthenia gravis ([place])      PLAN:  In order of problems listed above:  Dyspnea on exertion cath without stenosis 10/2018, echo 06/24/19 normal LV function, pacer function stable 04/2019-symptoms improved with steroids. F/u with Dr. [name] in 2-3 months  Sinus node dysfucntion S/P pacemaker followed by Dr. Lovena Le  PAF on eliquis  HTN BP well controlled  Myasthenia gravis   Medication Adjustments/Labs and Tests Ordered: Current medicines are reviewed at length with the patient today.  Concerns regarding medicines are outlined above.  Medication changes, Labs and Tests ordered today are listed in the Patient Instructions below. Patient Instructions  Medication Instructions:   1. Your physician recommends that you continue on your current medications as directed. Please refer to the Current Medication list given to you today.  *If you need a refill on your cardiac medications before your next appointment, please call your pharmacy*   Lab Work:  1. None Ordered  If you have labs (blood work) drawn today and your tests are completely normal, you will receive your results only by: Marland Kitchen MyChart Message (if you have MyChart) OR . A paper copy in the mail If you have any lab test that is abnormal or we need to change your treatment, we will call you to review the results.   Testing/Procedures:  1. None Ordered   Follow-Up: At [facility], you and your health needs are our priority.  As part of our continuing mission to provide you with exceptional heart care, we have created designated Provider Care Teams.  These Care Teams include your primary Cardiologist (physician) and Advanced Practice Providers (APPs -   Physician Assistants and Nurse Practitioners) who all work together to provide you with the care you need, when you need it.  We recommend signing up for the patient portal called "MyChart".  Sign up information is provided on this After Visit Summary.  MyChart is used to connect with patients for Virtual Visits (Telemedicine).  Patients are able to view lab/test results, encounter notes, upcoming appointments, etc.  Non-urgent messages can be sent to your provider as well.   To learn more about what you can do with MyChart, go to [email or web address].    Your next appointment:   2 month(s)  The format for your next appointment:   In Person  Provider:   Kate Sable, MD      Signed, Ermalinda Barrios, PA-C  07/01/2019 12:26 PM    [address], [place], [place]  [address] Phone: [phone]; Fax: [phone]

## 2019-06-30 DIAGNOSIS — Z1589 Genetic susceptibility to other disease: Secondary | ICD-10-CM | POA: Diagnosis not present

## 2019-06-30 DIAGNOSIS — Z08 Encounter for follow-up examination after completed treatment for malignant neoplasm: Secondary | ICD-10-CM | POA: Diagnosis not present

## 2019-06-30 DIAGNOSIS — C755 Malignant neoplasm of aortic body and other paraganglia: Secondary | ICD-10-CM | POA: Diagnosis not present

## 2019-06-30 DIAGNOSIS — G7000 Myasthenia gravis without (acute) exacerbation: Secondary | ICD-10-CM | POA: Diagnosis not present

## 2019-06-30 DIAGNOSIS — Z8589 Personal history of malignant neoplasm of other organs and systems: Secondary | ICD-10-CM | POA: Diagnosis not present

## 2019-07-01 ENCOUNTER — Other Ambulatory Visit: Payer: Self-pay

## 2019-07-01 ENCOUNTER — Ambulatory Visit (INDEPENDENT_AMBULATORY_CARE_PROVIDER_SITE_OTHER): Payer: Medicare Other | Admitting: Physician Assistant

## 2019-07-01 ENCOUNTER — Encounter: Payer: Self-pay | Admitting: Physician Assistant

## 2019-07-01 VITALS — BP 114/66 | HR 60 | Temp 97.9°F | Ht 64.0 in | Wt 185.0 lb

## 2019-07-01 DIAGNOSIS — I10 Essential (primary) hypertension: Secondary | ICD-10-CM

## 2019-07-01 DIAGNOSIS — G7000 Myasthenia gravis without (acute) exacerbation: Secondary | ICD-10-CM | POA: Diagnosis not present

## 2019-07-01 DIAGNOSIS — I480 Paroxysmal atrial fibrillation: Secondary | ICD-10-CM

## 2019-07-01 DIAGNOSIS — R0600 Dyspnea, unspecified: Secondary | ICD-10-CM | POA: Diagnosis not present

## 2019-07-01 DIAGNOSIS — Z950 Presence of cardiac pacemaker: Secondary | ICD-10-CM | POA: Diagnosis not present

## 2019-07-01 DIAGNOSIS — R0609 Other forms of dyspnea: Secondary | ICD-10-CM

## 2019-07-01 NOTE — Patient Instructions (Signed)
Medication Instructions:   1. Your physician recommends that you continue on your current medications as directed. Please refer to the Current Medication list given to you today.  *If you need a refill on your cardiac medications before your next appointment, please call your pharmacy*   Lab Work:  1. None Ordered  If you have labs (blood work) drawn today and your tests are completely normal, you will receive your results only by: Marland Kitchen MyChart Message (if you have MyChart) OR . A paper copy in the mail If you have any lab test that is abnormal or we need to change your treatment, we will call you to review the results.   Testing/Procedures:  1. None Ordered   Follow-Up: At [facility], you and your health needs are our priority.  As part of our continuing mission to provide you with exceptional heart care, we have created designated Provider Care Teams.  These Care Teams include your primary Cardiologist (physician) and Advanced Practice Providers (APPs -  Physician Assistants and Nurse Practitioners) who all work together to provide you with the care you need, when you need it.  We recommend signing up for the patient portal called "MyChart".  Sign up information is provided on this After Visit Summary.  MyChart is used to connect with patients for Virtual Visits (Telemedicine).  Patients are able to view lab/test results, encounter notes, upcoming appointments, etc.  Non-urgent messages can be sent to your provider as well.   To learn more about what you can do with MyChart, go to [email or web address].    Your next appointment:   2 month(s)  The format for your next appointment:   In Person  Provider:   Kate Sable, MD

## 2019-07-02 DIAGNOSIS — G4700 Insomnia, unspecified: Secondary | ICD-10-CM | POA: Diagnosis not present

## 2019-07-02 DIAGNOSIS — G894 Chronic pain syndrome: Secondary | ICD-10-CM | POA: Diagnosis not present

## 2019-07-02 DIAGNOSIS — E039 Hypothyroidism, unspecified: Secondary | ICD-10-CM | POA: Diagnosis not present

## 2019-07-02 DIAGNOSIS — N1832 Chronic kidney disease, stage 3b: Secondary | ICD-10-CM | POA: Diagnosis not present

## 2019-07-02 DIAGNOSIS — G7000 Myasthenia gravis without (acute) exacerbation: Secondary | ICD-10-CM | POA: Diagnosis not present

## 2019-07-02 DIAGNOSIS — F331 Major depressive disorder, recurrent, moderate: Secondary | ICD-10-CM | POA: Diagnosis not present

## 2019-07-02 DIAGNOSIS — I129 Hypertensive chronic kidney disease with stage 1 through stage 4 chronic kidney disease, or unspecified chronic kidney disease: Secondary | ICD-10-CM | POA: Diagnosis not present

## 2019-07-02 DIAGNOSIS — Z950 Presence of cardiac pacemaker: Secondary | ICD-10-CM | POA: Diagnosis not present

## 2019-07-02 DIAGNOSIS — I482 Chronic atrial fibrillation, unspecified: Secondary | ICD-10-CM | POA: Diagnosis not present

## 2019-07-02 DIAGNOSIS — Z79891 Long term (current) use of opiate analgesic: Secondary | ICD-10-CM | POA: Diagnosis not present

## 2019-07-02 DIAGNOSIS — E782 Mixed hyperlipidemia: Secondary | ICD-10-CM | POA: Diagnosis not present

## 2019-07-02 DIAGNOSIS — K219 Gastro-esophageal reflux disease without esophagitis: Secondary | ICD-10-CM | POA: Diagnosis not present

## 2019-07-15 DIAGNOSIS — Z1589 Genetic susceptibility to other disease: Secondary | ICD-10-CM | POA: Diagnosis not present

## 2019-07-15 DIAGNOSIS — Z8589 Personal history of malignant neoplasm of other organs and systems: Secondary | ICD-10-CM | POA: Diagnosis not present

## 2019-07-15 DIAGNOSIS — Z08 Encounter for follow-up examination after completed treatment for malignant neoplasm: Secondary | ICD-10-CM | POA: Diagnosis not present

## 2019-07-15 DIAGNOSIS — G7000 Myasthenia gravis without (acute) exacerbation: Secondary | ICD-10-CM | POA: Diagnosis not present

## 2019-07-31 ENCOUNTER — Other Ambulatory Visit: Payer: Self-pay | Admitting: Neurology

## 2019-08-05 ENCOUNTER — Ambulatory Visit (INDEPENDENT_AMBULATORY_CARE_PROVIDER_SITE_OTHER): Payer: Medicare Other | Admitting: *Deleted

## 2019-08-05 DIAGNOSIS — I495 Sick sinus syndrome: Secondary | ICD-10-CM | POA: Diagnosis not present

## 2019-08-05 LAB — CUP PACEART REMOTE DEVICE CHECK
Battery Remaining Longevity: 132 mo
Battery Voltage: 3.02 V
Brady Statistic AP VP Percent: 0.04 %
Brady Statistic AP VS Percent: 98.32 %
Brady Statistic AS VP Percent: 0 %
Brady Statistic AS VS Percent: 1.64 %
Brady Statistic RA Percent Paced: 98.45 %
Brady Statistic RV Percent Paced: 0.04 %
Date Time Interrogation Session: 20210518013334
Implantable Lead Implant Date: 20190501
Implantable Lead Implant Date: 20190501
Implantable Lead Location: 753859
Implantable Lead Location: 753860
Implantable Lead Model: 3830
Implantable Lead Model: 5076
Implantable Pulse Generator Implant Date: 20190501
Lead Channel Impedance Value: 323 Ohm
Lead Channel Impedance Value: 342 Ohm
Lead Channel Impedance Value: 399 Ohm
Lead Channel Impedance Value: 456 Ohm
Lead Channel Pacing Threshold Amplitude: 0.625 V
Lead Channel Pacing Threshold Amplitude: 0.75 V
Lead Channel Pacing Threshold Pulse Width: 0.4 ms
Lead Channel Pacing Threshold Pulse Width: 0.4 ms
Lead Channel Sensing Intrinsic Amplitude: 0.875 mV
Lead Channel Sensing Intrinsic Amplitude: 0.875 mV
Lead Channel Sensing Intrinsic Amplitude: 9.375 mV
Lead Channel Sensing Intrinsic Amplitude: 9.375 mV
Lead Channel Setting Pacing Amplitude: 2 V
Lead Channel Setting Pacing Amplitude: 2.5 V
Lead Channel Setting Pacing Pulse Width: 0.4 ms
Lead Channel Setting Sensing Sensitivity: 0.9 mV

## 2019-08-07 NOTE — Progress Notes (Signed)
Remote pacemaker transmission.   

## 2019-08-22 ENCOUNTER — Ambulatory Visit (INDEPENDENT_AMBULATORY_CARE_PROVIDER_SITE_OTHER): Payer: Medicare Other | Admitting: Internal Medicine

## 2019-08-22 ENCOUNTER — Other Ambulatory Visit: Payer: Self-pay

## 2019-08-22 ENCOUNTER — Encounter: Payer: Self-pay | Admitting: Internal Medicine

## 2019-08-22 VITALS — BP 144/74 | HR 69 | Ht 64.0 in | Wt 183.8 lb

## 2019-08-22 DIAGNOSIS — I495 Sick sinus syndrome: Secondary | ICD-10-CM | POA: Diagnosis not present

## 2019-08-22 LAB — CUP PACEART INCLINIC DEVICE CHECK
Battery Remaining Longevity: 133 mo
Battery Voltage: 3.02 V
Brady Statistic AP VP Percent: 0.03 %
Brady Statistic AP VS Percent: 89.75 %
Brady Statistic AS VP Percent: 0.01 %
Brady Statistic AS VS Percent: 10.21 %
Brady Statistic RA Percent Paced: 89.77 %
Brady Statistic RV Percent Paced: 0.04 %
Date Time Interrogation Session: 20210604115405
Implantable Lead Implant Date: 20190501
Implantable Lead Implant Date: 20190501
Implantable Lead Location: 753859
Implantable Lead Location: 753860
Implantable Lead Model: 3830
Implantable Lead Model: 5076
Implantable Pulse Generator Implant Date: 20190501
Lead Channel Impedance Value: 342 Ohm
Lead Channel Impedance Value: 380 Ohm
Lead Channel Impedance Value: 437 Ohm
Lead Channel Impedance Value: 513 Ohm
Lead Channel Pacing Threshold Amplitude: 0.5 V
Lead Channel Pacing Threshold Amplitude: 0.75 V
Lead Channel Pacing Threshold Pulse Width: 0.4 ms
Lead Channel Pacing Threshold Pulse Width: 0.4 ms
Lead Channel Sensing Intrinsic Amplitude: 1.3 mV
Lead Channel Sensing Intrinsic Amplitude: 12.6 mV
Lead Channel Setting Pacing Amplitude: 2 V
Lead Channel Setting Pacing Amplitude: 2.5 V
Lead Channel Setting Pacing Pulse Width: 0.4 ms
Lead Channel Setting Sensing Sensitivity: 0.9 mV

## 2019-08-22 NOTE — Patient Instructions (Signed)
Medication Instructions:  Your physician recommends that you continue on your current medications as directed. Please refer to the Current Medication list given to you today.  *If you need a refill on your cardiac medications before your next appointment, please call your pharmacy*   Lab Work: None today If you have labs (blood work) drawn today and your tests are completely normal, you will receive your results only by: . MyChart Message (if you have MyChart) OR . A paper copy in the mail If you have any lab test that is abnormal or we need to change your treatment, we will call you to review the results.   Testing/Procedures: None today   Follow-Up: At [facility], you and your health needs are our priority.  As part of our continuing mission to provide you with exceptional heart care, we have created designated Provider Care Teams.  These Care Teams include your primary Cardiologist (physician) and Advanced Practice Providers (APPs -  Physician Assistants and Nurse Practitioners) who all work together to provide you with the care you need, when you need it.  We recommend signing up for the patient portal called "MyChart".  Sign up information is provided on this After Visit Summary.  MyChart is used to connect with patients for Virtual Visits (Telemedicine).  Patients are able to view lab/test results, encounter notes, upcoming appointments, etc.  Non-urgent messages can be sent to your provider as well.   To learn more about what you can do with MyChart, go to [email or web address].    Your next appointment:   12 month(s)  The format for your next appointment:   In Person  Provider:   Gregg Taylor, MD   Other Instructions None       Thank you for choosing [facility] !         

## 2019-08-22 NOTE — Progress Notes (Signed)
HPI Mrs. Melle returns today for followup. She is s/p PPM insertion due to sinus node dysfunction. She has HTN. She has a h/o migraine headaches. Since her PPM insertion she has done well in that she does not feel her rapid atrial fib. She has been treated with prednisone and she has done well, tolerating 10 mg daily. No other complaints today. She feels better on the prednisone. Allergies  Allergen Reactions  . Nitrofurantoin Macrocrystal Nausea And Vomiting  . Ampicillin Rash    Did it involve swelling of the face/tongue/throat, SOB, or low BP? No Did it involve sudden or severe rash/hives, skin peeling, or any reaction on the inside of your mouth or nose? No Did you need to seek medical attention at a hospital or doctor's office? No When did it last happen?20 years ago If all above answers are "NO", may proceed with cephalosporin use.   Marland Kitchen Avocado Diarrhea and Nausea And Vomiting  . Biaxin [Clarithromycin] Rash  . Doxycycline Monohydrate Nausea And Vomiting  . Moxifloxacin     Contraindication myasthenia gravis  . Tetracyclines & Related Nausea And Vomiting     Current Outpatient Medications  Medication Sig Dispense Refill  . ALPRAZolam (XANAX) 0.5 MG tablet Take 1 mg by mouth 2 (two) times daily.    Marland Kitchen apixaban (ELIQUIS) 5 MG TABS tablet Eliquis 5 mg tablet    . Artificial Tear Solution (SOOTHE XP OP) Place 1 drop into both eyes 4 (four) times daily.    Marland Kitchen atenolol (TENORMIN) 25 MG tablet TAKE (1) TABLET BY MOUTH ONCE DAILY. MAY TAKE AN ADDITIONAL TABLET IF SYSTOLIC OVER 885 OR SEVERE PALPITATIONS. 120 tablet 2  . B Complex-C (B-COMPLEX WITH VITAMIN C) tablet Take 1 tablet by mouth daily with supper.    . cholecalciferol (VITAMIN D) 1000 units tablet Take 1,000 Units by mouth 2 (two) times a day.     . Coenzyme Q10 (COQ10) 100 MG CAPS Take 100 mg by mouth daily.    . diclofenac sodium (VOLTAREN) 1 % GEL Apply 4 g topically 4 (four) times daily as needed (for pain).     Marland Kitchen  ELIQUIS 5 MG TABS tablet TAKE (1) TABLET BY MOUTH TWICE DAILY. 180 tablet 1  . furosemide (LASIX) 20 MG tablet     . ketoconazole (NIZORAL) 2 % cream Apply 1 application topically 2 (two) times daily. applied under breasts    . lansoprazole (PREVACID) 30 MG capsule TAKE (1) CAPSULE BY MOUTH TWICE DAILY. 180 capsule 1  . levothyroxine (SYNTHROID) 112 MCG tablet Take 112 mcg by mouth daily before breakfast.    . lidocaine (LIDODERM) 5 % Place 1-3 patches onto the skin daily as needed (for pain). Remove & Discard patch within 12 hours or as directed by MD for pain    . mycophenolate (CELLCEPT) 500 MG tablet Take 1 tablet (500 mg total) by mouth 2 (two) times daily. 180 tablet 1  . oxyCODONE-acetaminophen (PERCOCET) 10-325 MG tablet Take 1 tablet by mouth every 6 (six) hours as needed (pain).     . potassium chloride (KLOR-CON) 10 MEQ tablet     . pravastatin (PRAVACHOL) 80 MG tablet Take 80 mg by mouth daily with supper.     . predniSONE (DELTASONE) 5 MG tablet Take 2 tablets (10 mg total) by mouth daily with breakfast. 180 tablet 1  . predniSONE (DELTASONE) 5 MG tablet Take by mouth.    . pyridostigmine (MESTINON) 60 MG tablet TAKE (1/2) TABLET BY MOUTH  FOUR TIMES DAILY. 180 tablet 1  . SUMAtriptan (IMITREX) 50 MG tablet Take 50 mg by mouth every 2 (two) hours as needed. For migraines    . Terbinafine 1 % GEL Apply 1 application topically 2 (two) times daily as needed (toe nail fungus).     . traZODone (DESYREL) 50 MG tablet TAKE (1) TABLET BY MOUTH AT BEDTIME. 90 tablet 0  . traZODone (DESYREL) 50 MG tablet TAKE (1) TABLET BY MOUTH AT BEDTIME. 90 tablet 0  . TURMERIC PO Take 1,000 mg by mouth daily with supper. Take twice a day     Current Facility-Administered Medications  Medication Dose Route Frequency Provider Last Rate Last Admin  . sodium chloride flush (NS) 0.9 % injection 3 mL  3 mL Intravenous Q12H Herminio Commons, MD         Past Medical History:  Diagnosis Date  . Anemia     . Chronic back pain   . Chronic nausea   . Complication of anesthesia    Low blood pressure, heart rate, O2 sat  . Depression with anxiety   . Depression with anxiety   . Diverticulosis   . Dyspnea   . Essential hypertension   . GERD (gastroesophageal reflux disease)   . History of pneumonia   . Hypothyroidism   . IBS (irritable colon syndrome)   . LVH (left ventricular hypertrophy)   . Migraines   . Mixed hyperlipidemia   . Obesity   . Ocular myasthenia gravis ([place])   . Osteoarthritis of knee   . PAF (paroxysmal atrial fibrillation) ([facility])    chads2vasc score is at least 4  . Paraganglioma ([place])    Resection 02/2012 (retroperitoneal)  . Paraganglioma, malignant ([place]) 11/06/2015  . Presence of permanent cardiac pacemaker 07/18/2017  . Renal insufficiency   . Skin cancer     ROS:   All systems reviewed and negative except as noted in the HPI.   Past Surgical History:  Procedure Laterality Date  . ABDOMINAL HYSTERECTOMY    . ABDOMINAL HYSTERECTOMY    . CARDIAC CATHETERIZATION    . CHOLECYSTECTOMY    . COLONOSCOPY  08/24/2011   Procedure: COLONOSCOPY;  Surgeon: Rogene Houston, MD;  Location: [facility];  Service: Endoscopy;  Laterality: N/A;  1200  . CT guided biopsy  01/16/12  . GIVENS CAPSULE STUDY  01/29/2012   Procedure: GIVENS CAPSULE STUDY;  Surgeon: Rogene Houston, MD;  Location: [facility];  Service: Endoscopy;  Laterality: N/A;  730  . KNEE SURGERY    . LAPAROTOMY  03/01/2012   Procedure: EXPLORATORY LAPAROTOMY;  Surgeon: Earnstine Regal, MD;  Location: [facility];  Service: General;  Laterality: N/A;  Exploratory Laparotomy ,Resection Retroperitoneal Mass  . NASAL SINUS SURGERY     30 yrs ago  . PACEMAKER IMPLANT N/A 07/18/2017   Procedure: PACEMAKER IMPLANT;  Surgeon: Evans Lance, MD;  Location: [facility];  Service: Cardiovascular;  Laterality: N/A;  . RIGHT/LEFT HEART CATH AND CORONARY ANGIOGRAPHY N/A 11/18/2018   Procedure: RIGHT/LEFT HEART  CATH AND CORONARY ANGIOGRAPHY;  Surgeon: Belva Crome, MD;  Location: [facility];  Service: Cardiovascular;  Laterality: N/A;  . TOTAL KNEE ARTHROPLASTY Bilateral    2005 and 2011     Family History  Problem Relation Age of Onset  . Inflammatory bowel disease Maternal Grandmother      Social History   Socioeconomic History  . Marital status: Widowed    Spouse name:  Not on file  . Number of children: Not on file  . Years of education: Not on file  . Highest education level: Not on file  Occupational History  . Occupation: Retired Quarry manager  Tobacco Use  . Smoking status: Never Smoker  . Smokeless tobacco: Never Used  Substance and Sexual Activity  . Alcohol use: No    Alcohol/week: 0.0 standard drinks  . Drug use: No  . Sexual activity: Not on file  Other Topics Concern  . Not on file  Social History Narrative   Lives in [place] alone.  Retired Quarry manager.   Caffeine use:    Right handed   Social Determinants of Health   Financial Resource Strain:   . Difficulty of Paying Living Expenses:   Food Insecurity:   . Worried About Charity fundraiser in the Last Year:   . Arboriculturist in the Last Year:   Transportation Needs:   . Film/video editor (Medical):   Marland Kitchen Lack of Transportation (Non-Medical):   Physical Activity:   . Days of Exercise per Week:   . Minutes of Exercise per Session:   Stress:   . Feeling of Stress :   Social Connections:   . Frequency of Communication with Friends and Family:   . Frequency of Social Gatherings with Friends and Family:   . Attends Religious Services:   . Active Member of Clubs or Organizations:   . Attends Archivist Meetings:   Marland Kitchen Marital Status:   Intimate Partner Violence:   . Fear of Current or Ex-Partner:   . Emotionally Abused:   Marland Kitchen Physically Abused:   . Sexually Abused:      BP (!) 144/74   Pulse 69   Ht 5\' 4"  (1.626 m)   Wt 183 lb 12.8 oz (83.4 kg)   SpO2 98%   BMI 31.55 kg/m    Physical Exam:  Well appearing 79 yo woman, looks younger than her stated age, NAD HEENT: Unremarkable Neck:  No JVD, no thyromegally Lymphatics:  No adenopathy Back:  No CVA tenderness Lungs:  Clear with no wheezes HEART:  Regular rate rhythm, no murmurs, no rubs, no clicks Abd:  soft, positive bowel sounds, no organomegally, no rebound, no guarding Ext:  2 plus pulses, no edema, no cyanosis, no clubbing Skin:  No rashes no nodules Neuro:  CN II through XII intact, motor grossly intact  EKG - nsr with atrial pacing  DEVICE  Normal device function.  See PaceArt for details.   Assess/Plan: 1. Sinus node dysfunction - she is asymptomatic, s/p PPM insertion. 2. PPM -her [employer] DDD PM is working normally. 3. HTN - her bp is fairly well controlled  4. Coags - she would like to stop her blood thinner but I strongly recommended against this.   Mikle Bosworth.D.

## 2019-08-26 ENCOUNTER — Other Ambulatory Visit: Payer: Self-pay | Admitting: Neurology

## 2019-08-27 ENCOUNTER — Other Ambulatory Visit: Payer: Self-pay

## 2019-08-27 MED ORDER — PREDNISONE 5 MG PO TABS: 10.0000 mg | ORAL_TABLET | Freq: Every day | ORAL | 1 refills | Status: DC

## 2019-09-02 ENCOUNTER — Ambulatory Visit (INDEPENDENT_AMBULATORY_CARE_PROVIDER_SITE_OTHER): Payer: Medicare Other | Admitting: Internal Medicine

## 2019-09-03 ENCOUNTER — Telehealth: Payer: Self-pay | Admitting: Neurology

## 2019-09-03 NOTE — Telephone Encounter (Signed)
I called pt back and she would like her prednisone decrease to 5mg  daily. PT stated she is currently taking two 5mg  for total of 10mg . Pt stated she has myasthenia gravis and is working in the yard and doing well.I stated message will be sent to Dr. Jannifer Franklin pt verbalized understanding.

## 2019-09-03 NOTE — Telephone Encounter (Signed)
Patient reached out to [place] wanting RN to call her to discuss her medication.

## 2019-09-04 MED ORDER — PREDNISONE 5 MG PO TABS: 5.0000 mg | ORAL_TABLET | Freq: Every day | ORAL | 1 refills | Status: DC

## 2019-09-04 NOTE — Addendum Note (Signed)
Addended by: Kathrynn Ducking on: 09/04/2019 11:13 AM   Modules accepted: Orders

## 2019-09-04 NOTE — Telephone Encounter (Signed)
I called the patient.  She is doing well on 10 mg of prednisone, she feels no more shortness of breath or fatigue.  We will try cutting back to 5 mg daily dosing.  She will call me if she is not doing well.

## 2019-09-05 ENCOUNTER — Ambulatory Visit (HOSPITAL_COMMUNITY)
Admission: RE | Admit: 2019-09-05 | Discharge: 2019-09-05 | Disposition: A | Discharge status: Home / Self Care | Payer: Medicare Other | Source: Ambulatory Visit | Attending: Adult Health Nurse Practitioner | Admitting: Adult Health Nurse Practitioner

## 2019-09-05 ENCOUNTER — Other Ambulatory Visit: Payer: Self-pay

## 2019-09-05 DIAGNOSIS — Z1231 Encounter for screening mammogram for malignant neoplasm of breast: Secondary | ICD-10-CM | POA: Insufficient documentation

## 2019-09-15 ENCOUNTER — Other Ambulatory Visit (HOSPITAL_COMMUNITY): Payer: Self-pay | Admitting: Adult Health Nurse Practitioner

## 2019-09-15 DIAGNOSIS — R928 Other abnormal and inconclusive findings on diagnostic imaging of breast: Secondary | ICD-10-CM

## 2019-09-18 DIAGNOSIS — N1832 Chronic kidney disease, stage 3b: Secondary | ICD-10-CM | POA: Diagnosis not present

## 2019-09-18 DIAGNOSIS — G4700 Insomnia, unspecified: Secondary | ICD-10-CM | POA: Diagnosis not present

## 2019-09-18 DIAGNOSIS — F331 Major depressive disorder, recurrent, moderate: Secondary | ICD-10-CM | POA: Diagnosis not present

## 2019-09-18 DIAGNOSIS — K219 Gastro-esophageal reflux disease without esophagitis: Secondary | ICD-10-CM | POA: Diagnosis not present

## 2019-09-18 DIAGNOSIS — G7000 Myasthenia gravis without (acute) exacerbation: Secondary | ICD-10-CM | POA: Diagnosis not present

## 2019-09-18 DIAGNOSIS — G894 Chronic pain syndrome: Secondary | ICD-10-CM | POA: Diagnosis not present

## 2019-09-18 DIAGNOSIS — Z79891 Long term (current) use of opiate analgesic: Secondary | ICD-10-CM | POA: Diagnosis not present

## 2019-09-18 DIAGNOSIS — C50919 Malignant neoplasm of unspecified site of unspecified female breast: Secondary | ICD-10-CM

## 2019-09-18 DIAGNOSIS — I129 Hypertensive chronic kidney disease with stage 1 through stage 4 chronic kidney disease, or unspecified chronic kidney disease: Secondary | ICD-10-CM | POA: Diagnosis not present

## 2019-09-18 DIAGNOSIS — E039 Hypothyroidism, unspecified: Secondary | ICD-10-CM | POA: Diagnosis not present

## 2019-09-18 DIAGNOSIS — Z950 Presence of cardiac pacemaker: Secondary | ICD-10-CM | POA: Diagnosis not present

## 2019-09-18 DIAGNOSIS — I482 Chronic atrial fibrillation, unspecified: Secondary | ICD-10-CM | POA: Diagnosis not present

## 2019-09-18 DIAGNOSIS — E782 Mixed hyperlipidemia: Secondary | ICD-10-CM | POA: Diagnosis not present

## 2019-09-18 HISTORY — DX: Malignant neoplasm of unspecified site of unspecified female breast: C50.919

## 2019-09-19 ENCOUNTER — Other Ambulatory Visit (INDEPENDENT_AMBULATORY_CARE_PROVIDER_SITE_OTHER): Payer: Self-pay | Admitting: Internal Medicine

## 2019-09-19 ENCOUNTER — Other Ambulatory Visit: Payer: Self-pay | Admitting: Neurology

## 2019-09-19 DIAGNOSIS — K219 Gastro-esophageal reflux disease without esophagitis: Secondary | ICD-10-CM

## 2019-09-23 ENCOUNTER — Other Ambulatory Visit: Payer: Self-pay

## 2019-09-23 ENCOUNTER — Other Ambulatory Visit (HOSPITAL_COMMUNITY): Payer: Self-pay | Admitting: Adult Health Nurse Practitioner

## 2019-09-23 ENCOUNTER — Ambulatory Visit (HOSPITAL_COMMUNITY)
Admission: RE | Admit: 2019-09-23 | Discharge: 2019-09-23 | Disposition: A | Discharge status: Home / Self Care | Payer: Medicare Other | Source: Ambulatory Visit | Attending: Adult Health Nurse Practitioner | Admitting: Adult Health Nurse Practitioner

## 2019-09-23 DIAGNOSIS — R928 Other abnormal and inconclusive findings on diagnostic imaging of breast: Secondary | ICD-10-CM | POA: Diagnosis not present

## 2019-09-23 MED ORDER — MYCOPHENOLATE MOFETIL 500 MG PO TABS: 500.0000 mg | ORAL_TABLET | Freq: Two times a day (BID) | ORAL | 3 refills | Status: DC

## 2019-09-24 ENCOUNTER — Encounter (INDEPENDENT_AMBULATORY_CARE_PROVIDER_SITE_OTHER): Payer: Self-pay

## 2019-09-26 ENCOUNTER — Ambulatory Visit: Payer: Medicare Other | Admitting: Cardiovascular Disease

## 2019-09-30 ENCOUNTER — Other Ambulatory Visit: Payer: Self-pay

## 2019-09-30 ENCOUNTER — Ambulatory Visit (HOSPITAL_COMMUNITY)
Admission: RE | Admit: 2019-09-30 | Discharge: 2019-09-30 | Disposition: A | Discharge status: Home / Self Care | Payer: Medicare Other | Source: Ambulatory Visit | Attending: Internal Medicine | Admitting: Internal Medicine

## 2019-09-30 ENCOUNTER — Ambulatory Visit (HOSPITAL_COMMUNITY)
Admission: RE | Admit: 2019-09-30 | Discharge: 2019-09-30 | Disposition: A | Discharge status: Home / Self Care | Payer: Medicare Other | Source: Ambulatory Visit | Attending: Adult Health Nurse Practitioner | Admitting: Adult Health Nurse Practitioner

## 2019-09-30 ENCOUNTER — Other Ambulatory Visit (HOSPITAL_COMMUNITY): Payer: Self-pay | Admitting: Internal Medicine

## 2019-09-30 DIAGNOSIS — R928 Other abnormal and inconclusive findings on diagnostic imaging of breast: Secondary | ICD-10-CM

## 2019-09-30 DIAGNOSIS — N6341 Unspecified lump in right breast, subareolar: Secondary | ICD-10-CM | POA: Diagnosis not present

## 2019-09-30 DIAGNOSIS — N6310 Unspecified lump in the right breast, unspecified quadrant: Secondary | ICD-10-CM | POA: Diagnosis not present

## 2019-09-30 MED ORDER — LIDOCAINE-EPINEPHRINE (PF) 1 %-1:200000 IJ SOLN
INTRAMUSCULAR | Status: AC
  Filled 2019-09-30: qty 30

## 2019-09-30 MED ORDER — LIDOCAINE HCL (PF) 1 % IJ SOLN
INTRAMUSCULAR | Status: AC
  Filled 2019-09-30: qty 5

## 2019-10-02 ENCOUNTER — Encounter (HOSPITAL_COMMUNITY): Payer: Medicare Other

## 2019-10-02 ENCOUNTER — Other Ambulatory Visit (HOSPITAL_COMMUNITY): Payer: Medicare Other

## 2019-10-02 DIAGNOSIS — I4891 Unspecified atrial fibrillation: Secondary | ICD-10-CM | POA: Diagnosis not present

## 2019-10-02 DIAGNOSIS — G894 Chronic pain syndrome: Secondary | ICD-10-CM | POA: Diagnosis not present

## 2019-10-02 DIAGNOSIS — F331 Major depressive disorder, recurrent, moderate: Secondary | ICD-10-CM | POA: Diagnosis not present

## 2019-10-02 DIAGNOSIS — N3000 Acute cystitis without hematuria: Secondary | ICD-10-CM | POA: Diagnosis not present

## 2019-10-02 DIAGNOSIS — I482 Chronic atrial fibrillation, unspecified: Secondary | ICD-10-CM | POA: Diagnosis not present

## 2019-10-02 DIAGNOSIS — E782 Mixed hyperlipidemia: Secondary | ICD-10-CM | POA: Diagnosis not present

## 2019-10-02 DIAGNOSIS — M25511 Pain in right shoulder: Secondary | ICD-10-CM | POA: Diagnosis not present

## 2019-10-02 DIAGNOSIS — N644 Mastodynia: Secondary | ICD-10-CM | POA: Diagnosis not present

## 2019-10-02 DIAGNOSIS — E039 Hypothyroidism, unspecified: Secondary | ICD-10-CM | POA: Diagnosis not present

## 2019-10-02 DIAGNOSIS — I10 Essential (primary) hypertension: Secondary | ICD-10-CM | POA: Diagnosis not present

## 2019-10-02 DIAGNOSIS — R0602 Shortness of breath: Secondary | ICD-10-CM | POA: Diagnosis not present

## 2019-10-02 DIAGNOSIS — G7000 Myasthenia gravis without (acute) exacerbation: Secondary | ICD-10-CM | POA: Diagnosis not present

## 2019-10-02 DIAGNOSIS — G4700 Insomnia, unspecified: Secondary | ICD-10-CM | POA: Diagnosis not present

## 2019-10-03 ENCOUNTER — Encounter (HOSPITAL_COMMUNITY): Payer: Medicare Other

## 2019-10-03 LAB — SURGICAL PATHOLOGY

## 2019-10-09 ENCOUNTER — Other Ambulatory Visit: Payer: Self-pay | Admitting: Internal Medicine

## 2019-10-09 DIAGNOSIS — N63 Unspecified lump in unspecified breast: Secondary | ICD-10-CM

## 2019-10-10 ENCOUNTER — Ambulatory Visit: Payer: Medicare Other | Admitting: Physician Assistant

## 2019-10-14 ENCOUNTER — Ambulatory Visit
Admission: RE | Admit: 2019-10-14 | Discharge: 2019-10-14 | Disposition: A | Discharge status: Home / Self Care | Payer: Medicare Other | Source: Ambulatory Visit | Attending: Internal Medicine | Admitting: Internal Medicine

## 2019-10-14 ENCOUNTER — Other Ambulatory Visit: Payer: Self-pay

## 2019-10-14 DIAGNOSIS — C50411 Malignant neoplasm of upper-outer quadrant of right female breast: Secondary | ICD-10-CM | POA: Diagnosis not present

## 2019-10-14 DIAGNOSIS — N63 Unspecified lump in unspecified breast: Secondary | ICD-10-CM

## 2019-10-14 DIAGNOSIS — N6311 Unspecified lump in the right breast, upper outer quadrant: Secondary | ICD-10-CM | POA: Diagnosis not present

## 2019-10-14 DIAGNOSIS — Z170 Estrogen receptor positive status [ER+]: Secondary | ICD-10-CM | POA: Diagnosis not present

## 2019-10-21 ENCOUNTER — Ambulatory Visit: Payer: Self-pay | Admitting: Surgery

## 2019-10-21 ENCOUNTER — Telehealth: Payer: Self-pay | Admitting: *Deleted

## 2019-10-21 ENCOUNTER — Other Ambulatory Visit: Payer: Self-pay | Admitting: Pharmacist

## 2019-10-21 DIAGNOSIS — C50111 Malignant neoplasm of central portion of right female breast: Secondary | ICD-10-CM

## 2019-10-21 DIAGNOSIS — C50911 Malignant neoplasm of unspecified site of right female breast: Secondary | ICD-10-CM | POA: Diagnosis not present

## 2019-10-21 DIAGNOSIS — D0511 Intraductal carcinoma in situ of right breast: Secondary | ICD-10-CM | POA: Diagnosis not present

## 2019-10-21 MED ORDER — APIXABAN 5 MG PO TABS: 5.0000 mg | ORAL_TABLET | Freq: Two times a day (BID) | ORAL | 5 refills | Status: DC

## 2019-10-21 NOTE — Telephone Encounter (Signed)
Patient with diagnosis of afib on Eliquis for anticoagulation.    Procedure: RIGHT BREAST SURGERY  Date of procedure: TBD  CHADS2-VASc score of  5 (CHF, HTN, AGE,AGE, female)  CrCl 40 ml/min  Per office protocol, patient can hold Eliquis for 2 days prior to procedure.

## 2019-10-21 NOTE — Telephone Encounter (Signed)
   [facility] Pre-operative Risk Assessment    HEARTCARE STAFF: - Please ensure there is not already an duplicate clearance open for this procedure. - Under Visit Info/Reason for Call, type in Other and utilize the format Clearance MM/DD/YY or Clearance TBD. Do not use dashes or single digits. - If request is for dental extraction, please clarify the # of teeth to be extracted.  Request for surgical clearance:  1. What type of surgery is being performed?  RIGHT BREAST SURGERY   2. When is this surgery scheduled?  TBD   3. What type of clearance is required (medical clearance vs. Pharmacy clearance to hold med vs. Both)?  BOTH  4. Are there any medications that need to be held prior to surgery and how long? ELIQUIS   5. Practice name and name of physician performing surgery?   [facility] / DR. TODD GERKIN  6. What is the office phone number?  [phone]   7.   What is the office fax number?  [phone]  8.   Anesthesia type (None, local, MAC, general) ?  GENERAL   [name] 10/21/2019, 3:09 PM  _________________________________________________________________   (provider comments below)

## 2019-10-21 NOTE — Telephone Encounter (Signed)
   Primary Cardiologist: Kate Sable, MD (Inactive) / Dr. Cristopher Peru  Chart reviewed as part of pre-operative protocol coverage. Patient was contacted 10/21/2019 in reference to pre-operative risk assessment for pending surgery as outlined below.  [name] was last seen on 08/22/2019 by Dr. Lovena Le.  Since that day, [name] has done well without chest pain or shortness of breath.  Therefore, based on ACC/AHA guidelines, the patient would be at acceptable risk for the planned procedure without further cardiovascular testing.   I will route this recommendation to the requesting party via Epic fax function and remove from pre-op pool. Please call with questions.  See below for recommendation by our clinical pharmacist regarding Eliquis. Given the proximity of surgery to the patient's pacemaker, please send separate device clearance to our device clinic in the [facility] office so recommendation can be made regarding what to do with her pacemaker during the surgery.   [place], [place] 10/21/2019, 5:49 PM

## 2019-10-22 ENCOUNTER — Encounter (HOSPITAL_BASED_OUTPATIENT_CLINIC_OR_DEPARTMENT_OTHER): Payer: Self-pay | Admitting: Surgery

## 2019-10-22 ENCOUNTER — Telehealth: Payer: Self-pay | Admitting: Hematology and Oncology

## 2019-10-22 ENCOUNTER — Encounter: Payer: Self-pay | Admitting: Internal Medicine

## 2019-10-22 ENCOUNTER — Other Ambulatory Visit: Payer: Self-pay

## 2019-10-22 ENCOUNTER — Encounter: Payer: Self-pay | Admitting: Adult Health

## 2019-10-22 DIAGNOSIS — Z170 Estrogen receptor positive status [ER+]: Secondary | ICD-10-CM | POA: Insufficient documentation

## 2019-10-22 NOTE — Progress Notes (Signed)
[facility] CONSULT NOTE  Patient Care Team: [name], MD as PCP - General (Internal Medicine) Herminio Commons, MD (Inactive) as PCP - Cardiology (Cardiology) Vaughan Basta, Rona Ravens, NP (Inactive) as Nurse Practitioner (Internal Medicine)  CHIEF COMPLAINTS/PURPOSE OF CONSULTATION:  Newly diagnosed breast cancer  HISTORY OF PRESENTING ILLNESS:  [name] June 79 y.o. female is here because of recent diagnosis of right breast cancer. Screening mammogram on 09/09/19 showed a right breast mass. Diagnostic mammogram and [place] on 09/23/19 showed a 1.0cm right breast mass at the 10 o'clock position and no right axillary adenopathy. Biopsy on 10/14/19 showed invasive ductal carcinoma with DCIS, grade 2, HER-2 equivocal by IHC, negative by FISH, ER+ 95%, PR- 0%, Ki67 2%. She presents to the clinic today for initial evaluation and discussion of treatment options.   I reviewed her records extensively and collaborated the history with the patient.  SUMMARY OF ONCOLOGIC HISTORY: Oncology History Overview Note  [place] Gene Abnormality Annual 24 hr urine collection for VMA's and catecholamines   Paraganglioma, malignant ([place])  01/01/2012 Imaging   CT abdomen/pelvis no acute findings identified within the abdomen/pelvis. Slight increase in size of enlarge and enhancing periaortic LN   01/10/2012 PET scan   Intensely hypermetabolic solitary L periaortic LN is concerning for a lymphoproliferative process vs a solitary metastasis. Diffuse intensely hypermetabolic thyroid activity is most consistent with a benign thyroiditis    01/16/2012 Initial Biopsy   IR CT guided biopsy of L para aortic lymph node   01/16/2012 Pathology Results   Low grade neoplasm with neuroendocrine differentiation. DDX includes paraganglioma, pheochromocytoma, and carcinoid   01/30/2012 Imaging   US soft tissue head/neck diffusely enlarged hypervascular nodular thyroid gland compatible with thyroiditis     03/01/2012 Surgery   Exploratory laparotomy, resection of retroperitoneal neuroendocrine tumor 3 x 2 x 2 cm with Dr. Armandina Gemma   03/01/2012 Pathology Results   Paraganglioma with associated fibrosis 2.5 cm, negative margins   04/17/2014 PET scan   There is no evidence for residual or recurrent hypermetabolic tumor or adenopathy. 2. Diffusely increased radiotracer uptake throughout both lobes of the thyroid gland. Correlation with patient's TSH is recommended.    09/10/2015 Imaging   MRI neck with/without contrast at [facility]. No neck mass or LAD. Likely mildly asymmetric lingual tonsil tissue in the L vallecular. 1.3 cm likely calcified L thyroid nodule.    10/08/2015 Imaging   CT C/A/P No evidence for residual or recurrent tumor or adenopathy.   06/15/2016 Imaging   MRI brain- No acute abnormality.  Negative for metastatic disease  Several small subcortical white matter hyperintensities have developed since 2013. This is most likely related to chronic microvascular ischemia.   06/15/2016 Imaging   CT CAP-   1. No evidence of thoracic metastasis. 1. No evidence of metastatic disease in the abdomen pelvis. 2. No periaortic adenopathy. 3. Sigmoid diverticulosis.  No diverticulitis.   Malignant neoplasm of upper-outer quadrant of right breast in female, estrogen receptor positive ([place])  10/14/2019 Cancer Staging   Staging form: Breast, AJCC 8th Edition - Clinical stage from 10/14/2019: Stage IA (cT1b, cN0, cM0, G2, ER+, PR-, HER2-) - Signed by Gardenia Phlegm, NP on 10/22/2019   10/22/2019 Initial Diagnosis   Screening mammogram showed a right breast mass. Diagnostic mammogram and US showed a 1.0cm right breast mass at the 10 o'clock position, no right axillary adenopathy. Biopsy showed IDC with DCIS, grade 2, HER-2 equivocal by IHC, negative by FISH, ER+ 95%, PR-  0%, Ki67 2%.      MEDICAL HISTORY:  Past Medical History:  Diagnosis Date  . Anemia   . Breast cancer ([place])  09/2019   right breast   . Chronic back pain   . Chronic nausea   . Depression with anxiety   . Depression with anxiety   . Diverticulosis   . Dyspnea    due to myasthenia gravis  . Essential hypertension   . GERD (gastroesophageal reflux disease)   . History of pneumonia   . Hypothyroidism   . IBS (irritable colon syndrome)   . LVH (left ventricular hypertrophy)   . Migraines   . Mixed hyperlipidemia   . Obesity   . Ocular myasthenia gravis ([place])   . Osteoarthritis of knee   . PAF (paroxysmal atrial fibrillation) ([facility])    chads2vasc score is at least 4  . Paraganglioma ([place])    Resection 02/2012 (retroperitoneal)  . Paraganglioma, malignant ([place]) 11/06/2015  . Presence of permanent cardiac pacemaker 07/18/2017  . Renal insufficiency   . Skin cancer     SURGICAL HISTORY: Past Surgical History:  Procedure Laterality Date  . ABDOMINAL HYSTERECTOMY    . ABDOMINAL HYSTERECTOMY    . CARDIAC CATHETERIZATION    . CHOLECYSTECTOMY    . COLONOSCOPY  08/24/2011   Procedure: COLONOSCOPY;  Surgeon: Rogene Houston, MD;  Location: [facility];  Service: Endoscopy;  Laterality: N/A;  1200  . CT guided biopsy  01/16/12  . GIVENS CAPSULE STUDY  01/29/2012   Procedure: GIVENS CAPSULE STUDY;  Surgeon: Rogene Houston, MD;  Location: [facility];  Service: Endoscopy;  Laterality: N/A;  730  . KNEE SURGERY    . LAPAROTOMY  03/01/2012   Procedure: EXPLORATORY LAPAROTOMY;  Surgeon: Earnstine Regal, MD;  Location: [facility];  Service: General;  Laterality: N/A;  Exploratory Laparotomy ,Resection Retroperitoneal Mass  . NASAL SINUS SURGERY     30 yrs ago  . PACEMAKER IMPLANT N/A 07/18/2017   Procedure: PACEMAKER IMPLANT;  Surgeon: Evans Lance, MD;  Location: [facility];  Service: Cardiovascular;  Laterality: N/A;  . RIGHT/LEFT HEART CATH AND CORONARY ANGIOGRAPHY N/A 11/18/2018   Procedure: RIGHT/LEFT HEART CATH AND CORONARY ANGIOGRAPHY;  Surgeon: Belva Crome, MD;  Location: [facility];  Service: Cardiovascular;  Laterality: N/A;  . TOTAL KNEE ARTHROPLASTY Bilateral    2005 and 2011    SOCIAL HISTORY: Social History   Socioeconomic History  . Marital status: Widowed    Spouse name: Not on file  . Number of children: Not on file  . Years of education: Not on file  . Highest education level: Not on file  Occupational History  . Occupation: Retired Quarry manager  Tobacco Use  . Smoking status: Never Smoker  . Smokeless tobacco: Never Used  Vaping Use  . Vaping Use: Never used  Substance and Sexual Activity  . Alcohol use: No    Alcohol/week: 0.0 standard drinks  . Drug use: No  . Sexual activity: Not on file  Other Topics Concern  . Not on file  Social History Narrative   Lives in [place] alone.  Retired Quarry manager.   Caffeine use:    Right handed   Social Determinants of Health   Financial Resource Strain:   . Difficulty of Paying Living Expenses:   Food Insecurity:   . Worried About Charity fundraiser in the Last Year:   . Arboriculturist in  the Last Year:   Transportation Needs:   . Film/video editor (Medical):   Marland Kitchen Lack of Transportation (Non-Medical):   Physical Activity:   . Days of Exercise per Week:   . Minutes of Exercise per Session:   Stress:   . Feeling of Stress :   Social Connections:   . Frequency of Communication with Friends and Family:   . Frequency of Social Gatherings with Friends and Family:   . Attends Religious Services:   . Active Member of Clubs or Organizations:   . Attends Archivist Meetings:   Marland Kitchen Marital Status:   Intimate Partner Violence:   . Fear of Current or Ex-Partner:   . Emotionally Abused:   Marland Kitchen Physically Abused:   . Sexually Abused:     FAMILY HISTORY: Family History  Problem Relation Age of Onset  . Inflammatory bowel disease Maternal Grandmother     ALLERGIES:  is allergic to nitrofurantoin macrocrystal, ampicillin, avocado, biaxin [clarithromycin], doxycycline  monohydrate, moxifloxacin, and tetracyclines & related.  MEDICATIONS:  Current Outpatient Medications  Medication Sig Dispense Refill  . ALPRAZolam (XANAX) 0.5 MG tablet Take 1 mg by mouth 2 (two) times daily.    Marland Kitchen apixaban (ELIQUIS) 5 MG TABS tablet Take 1 tablet (5 mg total) by mouth 2 (two) times daily. 60 tablet 5  . Artificial Tear Solution (SOOTHE XP OP) Place 1 drop into both eyes 4 (four) times daily.    Marland Kitchen atenolol (TENORMIN) 25 MG tablet TAKE (1) TABLET BY MOUTH ONCE DAILY. MAY TAKE AN ADDITIONAL TABLET IF SYSTOLIC OVER 852 OR SEVERE PALPITATIONS. 120 tablet 2  . atorvastatin (LIPITOR) 80 MG tablet Take 80 mg by mouth daily.    . B Complex-C (B-COMPLEX WITH VITAMIN C) tablet Take 1 tablet by mouth daily with supper. (Patient not taking: Reported on 10/23/2019)    . cholecalciferol (VITAMIN D) 1000 units tablet Take 1,000 Units by mouth 2 (two) times a day.  (Patient not taking: Reported on 10/23/2019)    . Coenzyme Q10 (COQ10) 100 MG CAPS Take 100 mg by mouth daily. (Patient not taking: Reported on 10/23/2019)    . diclofenac sodium (VOLTAREN) 1 % GEL Apply 4 g topically 4 (four) times daily as needed (for pain).     . furosemide (LASIX) 20 MG tablet     . ketoconazole (NIZORAL) 2 % cream Apply 1 application topically 2 (two) times daily. applied under breasts    . lansoprazole (PREVACID) 30 MG capsule TAKE (1) CAPSULE BY MOUTH TWICE DAILY. (Patient taking differently: daily at 12 noon. TAKE (1) CAPSULE BY MOUTH TWICE DAILY.) 180 capsule 0  . levothyroxine (SYNTHROID) 112 MCG tablet Take 112 mcg by mouth daily before breakfast.    . lidocaine (LIDODERM) 5 % Place 1-3 patches onto the skin daily as needed (for pain). Remove & Discard patch within 12 hours or as directed by MD for pain    . mycophenolate (CELLCEPT) 500 MG tablet Take 1 tablet (500 mg total) by mouth 2 (two) times daily. 180 tablet 3  . oxyCODONE-acetaminophen (PERCOCET) 10-325 MG tablet Take 1 tablet by mouth every 6 (six) hours  as needed (pain).     . potassium chloride (KLOR-CON) 10 MEQ tablet     . predniSONE (DELTASONE) 5 MG tablet Take 1 tablet (5 mg total) by mouth daily with breakfast. (Patient taking differently: Take 10 mg by mouth daily with breakfast. ) 90 tablet 1  . pyridostigmine (MESTINON) 60 MG tablet TAKE (1/2) TABLET BY  MOUTH FOUR TIMES DAILY. 180 tablet 1  . SUMAtriptan (IMITREX) 50 MG tablet Take 50 mg by mouth every 2 (two) hours as needed. For migraines    . Terbinafine 1 % GEL Apply 1 application topically 2 (two) times daily as needed (toe nail fungus).     . traZODone (DESYREL) 50 MG tablet TAKE (1) TABLET BY MOUTH AT BEDTIME. 90 tablet 0  . TURMERIC PO Take 1,000 mg by mouth daily with supper. Take twice a day     Current Facility-Administered Medications  Medication Dose Route Frequency Provider Last Rate Last Admin  . sodium chloride flush (NS) 0.9 % injection 3 mL  3 mL Intravenous Q12H Herminio Commons, MD        REVIEW OF SYSTEMS:   Constitutional: Denies fevers, chills or abnormal night sweats Eyes: Denies blurriness of vision, double vision or watery eyes Ears, nose, mouth, throat, and face: Denies mucositis or sore throat Respiratory: Denies cough, dyspnea or wheezes Cardiovascular: Denies palpitation, chest discomfort or lower extremity swelling Gastrointestinal:  Denies nausea, heartburn or change in bowel habits Skin: Denies abnormal skin rashes Lymphatics: Denies new lymphadenopathy or easy bruising Neurological:Denies numbness, tingling or new weaknesses Behavioral/Psych: Mood is stable, no new changes  Breast: Denies any palpable lumps or discharge All other systems were reviewed with the patient and are negative.  PHYSICAL EXAMINATION: ECOG PERFORMANCE STATUS: 1 - Symptomatic but completely ambulatory  There were no vitals filed for this visit. There were no vitals filed for this visit.  GENERAL:alert, no distress and comfortable SKIN: skin color, texture,  turgor are normal, no rashes or significant lesions EYES: normal, conjunctiva are pink and non-injected, sclera clear OROPHARYNX:no exudate, no erythema and lips, buccal mucosa, and tongue normal  NECK: supple, thyroid normal size, non-tender, without nodularity LYMPH:  no palpable lymphadenopathy in the cervical, axillary or inguinal LUNGS: clear to auscultation and percussion with normal breathing effort HEART: regular rate & rhythm and no murmurs and no lower extremity edema ABDOMEN:abdomen soft, non-tender and normal bowel sounds Musculoskeletal:no cyanosis of digits and no clubbing  PSYCH: alert & oriented x 3 with fluent speech NEURO: no focal motor/sensory deficits BREAST: No palpable nodules in breast. No palpable axillary or supraclavicular lymphadenopathy (exam performed in the presence of a chaperone)   LABORATORY DATA:  I have reviewed the data as listed Lab Results  Component Value Date   WBC 7.8 05/22/2019   HGB 14.2 05/22/2019   HCT 42.7 05/22/2019   MCV 99 (H) 05/22/2019   PLT 184 05/22/2019   Lab Results  Component Value Date   NA 142 05/22/2019   K 4.3 05/22/2019   CL 106 05/22/2019   CO2 21 05/22/2019    RADIOGRAPHIC STUDIES: I have personally reviewed the radiological reports and agreed with the findings in the report.  ASSESSMENT AND PLAN:  Malignant neoplasm of upper-outer quadrant of right breast in female, estrogen receptor positive ([place]) Screening mammogram detected right breast mass. 1.0cm right breast mass at the 10 o'clock position and no right axillary adenopathy.  Biopsy on 10/14/19 showed invasive ductal carcinoma with DCIS, grade 2, HER-2 equivocal by IHC, negative by FISH, ER+ 95%, PR- 0%, Ki67 2%.  T1b N0 stage Ia clinical stage  Pathology and radiology counseling:Discussed with the patient, the details of pathology including the type of breast cancer,the clinical staging, the significance of ER, PR and HER-2/neu receptors and the implications  for treatment. After reviewing the pathology in detail, we proceeded to discuss the different  treatment options.  Recommendations: 1. Breast conserving surgery followed by 2. +/- Adjuvant radiation therapy (patient and her daughter were not keen on radiation.  I believe that given her age, radiation will have very low degree of benefit.   3. Adjuvant antiestrogen therapy  Return to clinic after surgery to discuss pathology report.   All questions were answered. The patient knows to call the clinic with any problems, questions or concerns.   Rulon Eisenmenger, MD, MPH 10/23/2019    I, Molly Dorshimer, am acting as scribe for Nicholas Lose, MD.  I have reviewed the above documentation for accuracy and completeness, and I agree with the above.

## 2019-10-22 NOTE — Progress Notes (Signed)
PERIOPERATIVE PRESCRIPTION FOR IMPLANTED CARDIAC DEVICE PROGRAMMING  Patient Information: Name:  [name]  DOB:  [birth year]  MRN:  [id number]    Planned Procedure: right breast partial mastectomy  Surgeon: Dr Harlow Asa  Date of Procedure: 10-28-19  Cautery will be used.  Position during surgery: supine   Please send documentation back to:  [facility] (Fax # [phone])   Eveline Keto, RN  10/22/2019 1:19 PM   Device Information:  Clinic EP Physician:  Cristopher Peru, MD   Device Type:  Pacemaker Manufacturer and Phone #:  Medtronic: [phone] Pacemaker Dependent?:  No. Date of Last Device Check:  08/22/2019 (in-clinic)  Normal Device Function?:  Yes.    Electrophysiologist's Recommendations:   Have magnet available.  Provide continuous ECG monitoring when magnet is used or reprogramming is to be performed.   Procedure will likely interfere with device function.  Device should be programmed:  Asynchronous pacing during procedure and returned to normal programming after procedure  Per Device Clinic Standing Orders, York Ram, RN  1:32 PM 10/22/2019

## 2019-10-22 NOTE — Progress Notes (Signed)
Location of Breast Cancer: Malignant neoplasm of central portion of Right breast  Did patient present with symptoms (if so, please note symptoms) or was this found on screening mammography?: Routine mammogram  Mammogram 09/09/2019: mass in the right breast with architectural distortion.    Ultrasound 09/09/2019: 8 x 7 x 10 mm irregular hypoechoic mass in the right breast just lateral to the areola in the upper outer quadrant at the 10 o'clock position.  Histology per Pathology Report: Right Breast 10/14/2019  Receptor Status: ER(95% +), PR (0% -), Her2-neu (-), Ki-67(2%)     Past/Anticipated interventions by surgeon, if any: Dr. Harlow Asa 10/21/2019 -From a surgical standpoint, she will likely require a partial mastectomy with sentinel lymph node biopsy. -We will need to obtain clearance from her cardiologist and advise on management of her anticoagulation in the perioperative interval. -Patient will be be presented at breast cancer conference this week and we will also move forward with making referral to medical and radiation oncology for evaluation. -Right breast partial mastectomy 10/28/2019   Past/Anticipated interventions by medical oncology, if any: Chemotherapy   Lymphedema issues, if any: No  Pain issues, if any:  Generalized pain from arthritis.  Has had mild pain under her right arm.  SAFETY ISSUES:  Prior radiation? No  Pacemaker/ICD? Yes, Dr. Lovena Le cardio  Possible current pregnancy? Hysterectomy  Is the patient on methotrexate? no  Current Complaints / other details:   -history of benign breast cyst in the left breast which required needle aspiration many years ago.   Cori Razor, RN 10/22/2019,1:36 PM

## 2019-10-22 NOTE — Telephone Encounter (Signed)
Received a new pt referral from Dr. Harlow Asa at [facility] for new dx of breast cancer. [name] has been scheduled to see Dr. Lindi Adie on 8/5 at 4pm. Appt date and time has been given to the pt's daughter. Aware to arrive 15 minutes early.

## 2019-10-23 ENCOUNTER — Ambulatory Visit
Admission: RE | Admit: 2019-10-23 | Discharge: 2019-10-23 | Disposition: A | Discharge status: Home / Self Care | Payer: Medicare Other | Source: Ambulatory Visit | Attending: Radiation Oncology | Admitting: Radiation Oncology

## 2019-10-23 ENCOUNTER — Encounter: Payer: Self-pay | Admitting: Radiation Oncology

## 2019-10-23 ENCOUNTER — Encounter: Payer: Self-pay | Admitting: *Deleted

## 2019-10-23 ENCOUNTER — Inpatient Hospital Stay: Payer: Medicare Other | Attending: Hematology and Oncology | Admitting: Hematology and Oncology

## 2019-10-23 ENCOUNTER — Other Ambulatory Visit: Payer: Self-pay

## 2019-10-23 VITALS — Ht 64.0 in | Wt 184.0 lb

## 2019-10-23 DIAGNOSIS — Z950 Presence of cardiac pacemaker: Secondary | ICD-10-CM | POA: Diagnosis not present

## 2019-10-23 DIAGNOSIS — Z7952 Long term (current) use of systemic steroids: Secondary | ICD-10-CM | POA: Insufficient documentation

## 2019-10-23 DIAGNOSIS — Z170 Estrogen receptor positive status [ER+]: Secondary | ICD-10-CM | POA: Insufficient documentation

## 2019-10-23 DIAGNOSIS — N189 Chronic kidney disease, unspecified: Secondary | ICD-10-CM | POA: Diagnosis not present

## 2019-10-23 DIAGNOSIS — G894 Chronic pain syndrome: Secondary | ICD-10-CM | POA: Diagnosis not present

## 2019-10-23 DIAGNOSIS — E039 Hypothyroidism, unspecified: Secondary | ICD-10-CM | POA: Diagnosis not present

## 2019-10-23 DIAGNOSIS — E782 Mixed hyperlipidemia: Secondary | ICD-10-CM | POA: Diagnosis not present

## 2019-10-23 DIAGNOSIS — I129 Hypertensive chronic kidney disease with stage 1 through stage 4 chronic kidney disease, or unspecified chronic kidney disease: Secondary | ICD-10-CM | POA: Diagnosis not present

## 2019-10-23 DIAGNOSIS — K219 Gastro-esophageal reflux disease without esophagitis: Secondary | ICD-10-CM | POA: Diagnosis not present

## 2019-10-23 DIAGNOSIS — I482 Chronic atrial fibrillation, unspecified: Secondary | ICD-10-CM | POA: Diagnosis not present

## 2019-10-23 DIAGNOSIS — Z79899 Other long term (current) drug therapy: Secondary | ICD-10-CM | POA: Insufficient documentation

## 2019-10-23 DIAGNOSIS — Z791 Long term (current) use of non-steroidal anti-inflammatories (NSAID): Secondary | ICD-10-CM | POA: Diagnosis not present

## 2019-10-23 DIAGNOSIS — Z85828 Personal history of other malignant neoplasm of skin: Secondary | ICD-10-CM | POA: Diagnosis not present

## 2019-10-23 DIAGNOSIS — C50411 Malignant neoplasm of upper-outer quadrant of right female breast: Secondary | ICD-10-CM | POA: Diagnosis not present

## 2019-10-23 DIAGNOSIS — I480 Paroxysmal atrial fibrillation: Secondary | ICD-10-CM | POA: Diagnosis not present

## 2019-10-23 DIAGNOSIS — Z79891 Long term (current) use of opiate analgesic: Secondary | ICD-10-CM | POA: Diagnosis not present

## 2019-10-23 DIAGNOSIS — Z7901 Long term (current) use of anticoagulants: Secondary | ICD-10-CM | POA: Diagnosis not present

## 2019-10-23 DIAGNOSIS — G4700 Insomnia, unspecified: Secondary | ICD-10-CM | POA: Diagnosis not present

## 2019-10-23 DIAGNOSIS — G7000 Myasthenia gravis without (acute) exacerbation: Secondary | ICD-10-CM | POA: Diagnosis not present

## 2019-10-23 DIAGNOSIS — Z95810 Presence of automatic (implantable) cardiac defibrillator: Secondary | ICD-10-CM | POA: Diagnosis not present

## 2019-10-23 DIAGNOSIS — F331 Major depressive disorder, recurrent, moderate: Secondary | ICD-10-CM | POA: Diagnosis not present

## 2019-10-23 NOTE — Assessment & Plan Note (Signed)
Screening mammogram detected right breast mass. 1.0cm right breast mass at the 10 o'clock position and no right axillary adenopathy.  Biopsy on 10/14/19 showed invasive ductal carcinoma with DCIS, grade 2, HER-2 equivocal by IHC, negative by FISH, ER+ 95%, PR- 0%, Ki67 2%.  T1b N0 stage Ia clinical stage  Pathology and radiology counseling:Discussed with the patient, the details of pathology including the type of breast cancer,the clinical staging, the significance of ER, PR and HER-2/neu receptors and the implications for treatment. After reviewing the pathology in detail, we proceeded to discuss the different treatment options.  Recommendations: 1. Breast conserving surgery followed by 2. +/- Adjuvant radiation therapy followed by 4. Adjuvant antiestrogen therapy  Return to clinic after surgery to discuss pathology report.

## 2019-10-23 NOTE — Progress Notes (Signed)
Radiation Oncology         ([phone]) [phone] ________________________________  Initial Outpatient Consultation - Conducted via telephone due to current COVID-19 concerns for limiting patient exposure  I spoke with the patient to conduct this consult visit via telephone to spare the patient unnecessary potential exposure in the healthcare setting during the current COVID-19 pandemic. The patient was notified in advance and was offered a [place] meeting to allow for face to face communication but unfortunately reported that they did not have the appropriate resources/technology to support such a visit and instead preferred to proceed with a telephone consult.    Name: [name]        MRN: [id number]  Date of Service: 10/23/2019 DOB: Nov 10, 1940  OI:NOMV, Edwinna Areola, MD  Armandina Gemma, MD     REFERRING PHYSICIAN: Armandina Gemma, MD   DIAGNOSIS: The encounter diagnosis was Malignant neoplasm of upper-outer quadrant of right breast in female, estrogen receptor positive ([place]).   HISTORY OF PRESENT ILLNESS: [name] is a 79 y.o. female with a new diagnosis of right breast cancer. The patient was noted to have a screening distortion and a mass in the upper outer quadrant on mammography, anteriorly in the right breast at 10:00 diagnostic imaging identified a 1 x 0.8 5.7 cm mass.  Her axilla was negative for adenopathy, and she underwent biopsy on 09/30/2019 revealing dilated ducts, she underwent repeat biopsy on 10/14/2019 in the anterior upper outer quadrant invasive ductal carcinoma with associated DCIS was identified.  Her tumor was ER positive, PR negative, HER-2 was equivocal and negative by FISH, Ki-67 was 2%.  She is contacted today to discuss treatment recommendations for her cancer.    PREVIOUS RADIATION THERAPY: No   PAST MEDICAL HISTORY:  Past Medical History:  Diagnosis Date  . Anemia   . Breast cancer ([place]) 09/2019   right breast   . Chronic back pain   . Chronic nausea   .  Depression with anxiety   . Depression with anxiety   . Diverticulosis   . Dyspnea    due to myasthenia gravis  . Essential hypertension   . GERD (gastroesophageal reflux disease)   . History of pneumonia   . Hypothyroidism   . IBS (irritable colon syndrome)   . LVH (left ventricular hypertrophy)   . Migraines   . Mixed hyperlipidemia   . Obesity   . Ocular myasthenia gravis ([place])   . Osteoarthritis of knee   . PAF (paroxysmal atrial fibrillation) ([facility])    chads2vasc score is at least 4  . Paraganglioma ([place])    Resection 02/2012 (retroperitoneal)  . Paraganglioma, malignant ([place]) 11/06/2015  . Presence of permanent cardiac pacemaker 07/18/2017  . Renal insufficiency   . Skin cancer        PAST SURGICAL HISTORY: Past Surgical History:  Procedure Laterality Date  . ABDOMINAL HYSTERECTOMY    . ABDOMINAL HYSTERECTOMY    . CARDIAC CATHETERIZATION    . CHOLECYSTECTOMY    . COLONOSCOPY  08/24/2011   Procedure: COLONOSCOPY;  Surgeon: Rogene Houston, MD;  Location: [facility];  Service: Endoscopy;  Laterality: N/A;  1200  . CT guided biopsy  01/16/12  . GIVENS CAPSULE STUDY  01/29/2012   Procedure: GIVENS CAPSULE STUDY;  Surgeon: Rogene Houston, MD;  Location: [facility];  Service: Endoscopy;  Laterality: N/A;  730  . KNEE SURGERY    . LAPAROTOMY  03/01/2012   Procedure: EXPLORATORY LAPAROTOMY;  Surgeon: Earnstine Regal,  MD;  Location: [facility];  Service: General;  Laterality: N/A;  Exploratory Laparotomy ,Resection Retroperitoneal Mass  . NASAL SINUS SURGERY     30 yrs ago  . PACEMAKER IMPLANT N/A 07/18/2017   Procedure: PACEMAKER IMPLANT;  Surgeon: Evans Lance, MD;  Location: [facility];  Service: Cardiovascular;  Laterality: N/A;  . RIGHT/LEFT HEART CATH AND CORONARY ANGIOGRAPHY N/A 11/18/2018   Procedure: RIGHT/LEFT HEART CATH AND CORONARY ANGIOGRAPHY;  Surgeon: Belva Crome, MD;  Location: [facility];  Service: Cardiovascular;  Laterality: N/A;  .  TOTAL KNEE ARTHROPLASTY Bilateral    2005 and 2011     FAMILY HISTORY:  Family History  Problem Relation Age of Onset  . Inflammatory bowel disease Maternal Grandmother      SOCIAL HISTORY:  reports that she has never smoked. She has never used smokeless tobacco. She reports that she does not drink alcohol and does not use drugs. 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    ALLERGIES: Nitrofurantoin macrocrystal, Ampicillin, Avocado, Biaxin [clarithromycin], Doxycycline monohydrate, Moxifloxacin, and Tetracyclines & related   MEDICATIONS:  Current Outpatient Medications  Medication Sig Dispense Refill  . ALPRAZolam (XANAX) 0.5 MG tablet Take 1 mg by mouth 2 (two) times daily.    Marland Kitchen apixaban (ELIQUIS) 5 MG TABS tablet Take 1 tablet (5 mg total) by mouth 2 (two) times daily. 60 tablet 5  . Artificial Tear Solution (SOOTHE XP OP) Place 1 drop into both eyes 4 (four) times daily.    Marland Kitchen atenolol (TENORMIN) 25 MG tablet TAKE (1) TABLET BY MOUTH ONCE DAILY. MAY TAKE AN ADDITIONAL TABLET IF SYSTOLIC OVER 322 OR SEVERE PALPITATIONS. 120 tablet 2  . atorvastatin (LIPITOR) 80 MG tablet Take 80 mg by mouth daily.    . B Complex-C (B-COMPLEX WITH VITAMIN C) tablet Take 1 tablet by mouth daily with supper.    . cholecalciferol (VITAMIN D) 1000 units tablet Take 1,000 Units by mouth 2 (two) times a day.     . Coenzyme Q10 (COQ10) 100 MG CAPS Take 100 mg by mouth daily.    . diclofenac sodium (VOLTAREN) 1 % GEL Apply 4 g topically 4 (four) times daily as needed (for pain).     . furosemide (LASIX) 20 MG tablet     . ketoconazole (NIZORAL) 2 % cream Apply 1 application topically 2 (two) times daily. applied under breasts    . lansoprazole (PREVACID) 30 MG capsule TAKE (1) CAPSULE BY MOUTH TWICE DAILY. (Patient taking differently: daily at 12 noon. TAKE (1) CAPSULE BY MOUTH TWICE DAILY.) 180 capsule 0  . levothyroxine (SYNTHROID) 112 MCG tablet Take 112 mcg by mouth daily before breakfast.     . lidocaine (LIDODERM) 5 % Place 1-3 patches onto the skin daily as needed (for pain). Remove & Discard patch within 12 hours or as directed by MD for pain    . mycophenolate (CELLCEPT) 500 MG tablet Take 1 tablet (500 mg total) by mouth 2 (two) times daily. 180 tablet 3  . oxyCODONE-acetaminophen (PERCOCET) 10-325 MG tablet Take 1 tablet by mouth every 6 (six) hours as needed (pain).     . potassium chloride (KLOR-CON) 10 MEQ tablet     . predniSONE (DELTASONE) 5 MG tablet Take 1 tablet (5 mg total) by mouth daily with breakfast. (Patient taking differently: Take 10 mg by mouth daily with breakfast. ) 90 tablet 1  . pyridostigmine (MESTINON) 60 MG tablet TAKE (1/2) TABLET BY MOUTH FOUR TIMES  DAILY. 180 tablet 1  . SUMAtriptan (IMITREX) 50 MG tablet Take 50 mg by mouth every 2 (two) hours as needed. For migraines    . Terbinafine 1 % GEL Apply 1 application topically 2 (two) times daily as needed (toe nail fungus).     . traZODone (DESYREL) 50 MG tablet TAKE (1) TABLET BY MOUTH AT BEDTIME. 90 tablet 0  . TURMERIC PO Take 1,000 mg by mouth daily with supper. Take twice a day     Current Facility-Administered Medications  Medication Dose Route Frequency Provider Last Rate Last Admin  . sodium chloride flush (NS) 0.9 % injection 3 mL  3 mL Intravenous Q12H Herminio Commons, MD         REVIEW OF SYSTEMS: On review of systems, the patient reports that she is doing well overall. She continues to live independently. No specific complaints are noted.  PHYSICAL EXAM:  Wt Readings from Last 3 Encounters:  10/22/19 184 lb 15.5 oz (83.9 kg)  08/22/19 183 lb 12.8 oz (83.4 kg)  07/01/19 185 lb (83.9 kg)   Unable to assess due to encounter type.    ECOG = 0  0 - Asymptomatic (Fully active, able to carry on all predisease activities without restriction)  1 - Symptomatic but completely ambulatory (Restricted in physically strenuous activity but ambulatory and able to carry out work of a light  or sedentary nature. For example, light housework, office work)  2 - Symptomatic, <50% in bed during the day (Ambulatory and capable of all self care but unable to carry out any work activities. Up and about more than 50% of waking hours)  3 - Symptomatic, >50% in bed, but not bedbound (Capable of only limited self-care, confined to bed or chair 50% or more of waking hours)  4 - Bedbound (Completely disabled. Cannot carry on any self-care. Totally confined to bed or chair)  5 - Death   Eustace Pen MM, Creech RH, Tormey DC, et al. [phone]). "Toxicity and response criteria of the [facility] Group". [place] Oncol. 5 (6): 649-55    LABORATORY DATA:  Lab Results  Component Value Date   WBC 7.8 05/22/2019   HGB 14.2 05/22/2019   HCT 42.7 05/22/2019   MCV 99 (H) 05/22/2019   PLT 184 05/22/2019   Lab Results  Component Value Date   NA 142 05/22/2019   K 4.3 05/22/2019   CL 106 05/22/2019   CO2 21 05/22/2019   Lab Results  Component Value Date   ALT 9 05/22/2019   AST 17 05/22/2019   ALKPHOS 53 05/22/2019   BILITOT 0.5 05/22/2019      RADIOGRAPHY: US BREAST LTD UNI RIGHT INC AXILLA  Result Date: 09/23/2019 CLINICAL DATA:  Screening recall for right breast mass. EXAM: DIGITAL DIAGNOSTIC UNILATERAL RIGHT MAMMOGRAM WITH TOMO AND CAD; ULTRASOUND RIGHT BREAST LIMITED RIGHT BREAST ULTRASOUND COMPARISON:  Previous exams. ACR Breast Density Category b: There are scattered areas of fibroglandular density. FINDINGS: Spot compression tomograms were performed over the central right breast demonstrating an irregular spiculated mass measuring approximately 0.9 cm. Mammographic images were processed with CAD. Physical examination reveals slight flattening of the right nipple areola complex. There is a firm area of thickening with associated tenderness in the retroareolar right breast. Targeted ultrasound of the right breast was performed. There is an irregular hypoechoic mass in the  right breast at 10 o'clock retroareolar measuring 0.8 x 0.7 x 1 cm. This corresponds well with the mass seen in the  right breast at mammography. No lymphadenopathy seen in the right axilla. IMPRESSION: Suspicious 1 cm mass in the right breast at 10 o'clock retroareolar. RECOMMENDATION: Recommend ultrasound-guided biopsy of mass in the right breast at 10 o'clock retroareolar. I have discussed the findings and recommendations with the patient. If applicable, a reminder letter will be sent to the patient regarding the next appointment. BI-RADS CATEGORY  5: Highly suggestive of malignancy. Electronically Signed   By: Edwin Cap M.D.   On: 09/23/2019 14:06   MM CLIP PLACEMENT RIGHT  Result Date: 10/14/2019 CLINICAL DATA:  Status post stereotactic guided core needle biopsy of a spiculated mass in the anterior upper outer quadrant of the right breast. EXAM: DIAGNOSTIC RIGHT MAMMOGRAM POST STEREOTACTIC BIOPSY COMPARISON:  Previous exam(s). FINDINGS: Mammographic images were obtained following stereotactic guided biopsy of the recently demonstrated spiculated mass in the anterior upper outer right breast. The biopsy marking clip is in expected position at the site of biopsy. IMPRESSION: Appropriate positioning of the coil shaped biopsy marking clip at the site of biopsy in the spiculated mass in the upper-outer quadrant of the right breast anteriorly. Final Assessment: Post Procedure Mammograms for Marker Placement Electronically Signed   By: Beckie Salts M.D.   On: 10/14/2019 10:34   MM CLIP PLACEMENT RIGHT  Result Date: 09/30/2019 CLINICAL DATA:  Ultrasound-guided biopsy was performed a mass with distortion in the retroareolar right breast at 10 o'clock position. EXAM: DIAGNOSTIC RIGHT MAMMOGRAM POST ULTRASOUND BIOPSY COMPARISON:  Previous exam(s). FINDINGS: Mammographic images were obtained following ultrasound guided biopsy of a right breast mass. The ribbon shaped biopsy marking clip is directly 1.5 cm  anterior to the suspicious mass on the mammogram. The mass is best seen in the craniocaudal projection of today's post biopsy mammogram. IMPRESSION: Ribbon shaped biopsy clip is anterior to the mammographically identified mass. It is suspected that the biopsy clip migrated slightly. For localization purposes, suggest directly targeting/localizing the mass. Final Assessment: Post Procedure Mammograms for Marker Placement Electronically Signed   By: Britta Mccreedy M.D.   On: 09/30/2019 14:11   MM RT BREAST BX W LOC DEV 1ST LESION IMAGE BX SPEC STEREO GUIDE  Addendum Date: 10/15/2019   ADDENDUM REPORT: 10/15/2019 10:31 ADDENDUM: Pathology revealed GRADE II INVASIVE DUCTAL CARCINOMA, DUCTAL CARCINOMA IN SITU of the Right breast, anterior upper outer quadrant. This was found to be concordant by Dr. Beckie Salts. Pathology results were discussed with the patient and her daughter, Raul Del, by telephone. The patient reported doing well after the biopsy with tenderness and bruising at the site. Post biopsy instructions and care were reviewed and questions were answered. The patient was encouraged to call [facility] for any additional concerns. My direct phone number was provided. Surgical consultation has been arranged with Dr. Darnell Level, per patient request, at [facility] on October 21, 2019. Pathology results reported by Rene Kocher, RN on 10/15/2019. Electronically Signed   By: Beckie Salts M.D.   On: 10/15/2019 10:31   Addendum Date: 10/14/2019   ADDENDUM REPORT: 10/14/2019 10:36 ADDENDUM: The exam title should read as follows: RIGHT BREAST STEREOTACTIC CORE NEEDLE BIOPSY, not left. Electronically Signed   By: Beckie Salts M.D.   On: 10/14/2019 10:36   Result Date: 10/15/2019 CLINICAL DATA:  Irregular spiculated mass in the anterior right breast in the slightly upper outer quadrant. A hypoechoic area of felt to correspond to this mass at ultrasound was biopsied  under ultrasound guidance with discordant benign results.  The post clip mammogram demonstrated the ribbon shaped biopsy marker clip 1.5 cm anterior to the mammographic mass. Therefore, 3D guided stereotactic guided core needle biopsy of spiculated mass was recommended and will be performed today. EXAM: LEFT BREAST STEREOTACTIC CORE NEEDLE BIOPSY COMPARISON:  Previous exams. FINDINGS: The patient and I discussed the procedure of stereotactic-guided biopsy including benefits and alternatives. We discussed the high likelihood of a successful procedure. We discussed the risks of the procedure including infection, bleeding, tissue injury, clip migration, and inadequate sampling. Informed written consent was given. The usual time out protocol was performed immediately prior to the procedure. Using sterile technique and 1% Lidocaine as local anesthetic, under stereotactic guidance, a 9 gauge vacuum assisted device was used to perform core needle biopsy of the recently demonstrated spiculated mass in the slightly upper outer right breast anteriorly using a cephalad approach. Lesion quadrant: Upper outer quadrant At the conclusion of the procedure, a coil shaped tissue marker clip was deployed into the biopsy cavity. Follow-up 2-view mammogram was performed and dictated separately. IMPRESSION: Stereotactic-guided biopsy of the recently demonstrated irregular spiculated mass in upper outer right breast. No apparent complications. Electronically Signed: By: Claudie Revering M.D. On: 10/14/2019 10:25   [place] RT BREAST BX W LOC DEV 1ST LESION IMG BX SPEC US GUIDE  Addendum Date: 10/06/2019   ADDENDUM REPORT: 10/03/2019 15:07 ADDENDUM: PATHOLOGY revealed: A. BREAST, RIGHT, BIOPSY: - Dilated ducts. - No malignancy identified. Pathology results are DISCORDANT with imaging findings, per Dr. Curlene Dolphin with additional biopsy recommended. Pathology results and recommendations below were discussed with patient by telephone on 10/03/2019.  Patient reported biopsy site within normal limits with slight tenderness at the site. Post biopsy care instructions were reviewed and questions were answered. Patient was instructed to call [facility] Mammography Department if any concerns or questions arise related to the biopsy. Recommendation: 1. Please schedule patient for stereotactic biopsy with tomographic technique (3D) procedure on RIGHT breast to biopsy the retroareolar mass right breast, given the discordant findings on the ultrasound-guided biopsy. Request for stereotactic biopsy procedure relayed to Kathi Der RT at [facility] Mammography Department. Addendum by Electa Sniff RN on 10/03/2019. Electronically Signed   By: Curlene Dolphin M.D.   On: 10/03/2019 15:07   Result Date: 10/06/2019 CLINICAL DATA:  Ultrasound-guided core needle biopsy was recommended a suspicious mass in the retroareolar right breast 10 o'clock axis. EXAM: ULTRASOUND GUIDED RIGHT BREAST CORE NEEDLE BIOPSY COMPARISON:  Previous exam(s). PROCEDURE: I met with the patient and we discussed the procedure of ultrasound-guided biopsy, including benefits and alternatives. We discussed the high likelihood of a successful procedure. We discussed the risks of the procedure, including infection, bleeding, tissue injury, clip migration, and inadequate sampling. Informed written consent was given. The usual time-out protocol was performed immediately prior to the procedure. Lesion quadrant: Upper outer quadrant Using sterile technique and 1% Lidocaine as local anesthetic, under direct ultrasound visualization, a 14 gauge spring-loaded device was used to perform biopsy of the retroareolar right breast mass directly overlying the pectoralis muscle with the patient in supine position using a lateral approach. At the conclusion of the procedure ribbon tissue marker clip was deployed into the biopsy cavity. Follow up 2 view mammogram was performed and  dictated separately. IMPRESSION: Ultrasound guided biopsy of the right breast. No apparent complications. Electronically Signed: By: Curlene Dolphin M.D. On: 09/30/2019 14:13       IMPRESSION/PLAN: 1. Stage IA, [id number], grade 2, ER positive  invasive ductal carcinoma of the right breast. Dr. Lisbeth Renshaw is aware of her case and work up and I reviewed the pathology findings and nature of right breast disease. The consensus from the breast conference includes breast conservation with lumpectomy, possible external radiotherapy to the breast, followed by antiestrogen therapy. We also reviewed scenarios for which radiotherapy may be avoided. We discussed the risks, benefits, short, and long term effects of radiotherapy, and the patient is interested in deciding based on her final pathology results, but may desire to forgo treatment if her results are favorable. Dr. Lisbeth Renshaw  anticipates a course of 4 weeks of radiotherapy if she were to proceed. We would see her back about 2 weeks after surgery to discuss the simulation process and anticipate we starting radiotherapy about 4-6 weeks after surgery.  2. In Situ ICD. We will reach out to her cardiologist for permission to treat with radiotherapy if we proceed following surgery.   Given current concerns for patient exposure during the COVID-19 pandemic, this encounter was conducted via telephone.  The patient has provided two factor identification and has given verbal consent for this type of encounter and has been advised to only accept a meeting of this type in a secure network environment. The time spent during this encounter was 45 minutes including preparation, discussion, and coordination of the patient's care. The attendants for this meeting include Blenda Nicely, RN, Hayden Pedro  and [place] and her daughter Charisse Klinefelter.  During the encounter,  Blenda Nicely, RN, and Hayden Pedro were located at [facility] Radiation  Oncology Department.  Ruby Voula Waln was located at home with her daughter Charisse Klinefelter.      Carola Rhine, PAC

## 2019-10-24 ENCOUNTER — Other Ambulatory Visit (HOSPITAL_COMMUNITY)
Admission: RE | Admit: 2019-10-24 | Discharge: 2019-10-24 | Disposition: A | Discharge status: Home / Self Care | Payer: Medicare Other | Source: Ambulatory Visit | Attending: Surgery | Admitting: Surgery

## 2019-10-24 ENCOUNTER — Other Ambulatory Visit (HOSPITAL_COMMUNITY): Payer: Medicare Other

## 2019-10-24 DIAGNOSIS — Z20822 Contact with and (suspected) exposure to covid-19: Secondary | ICD-10-CM | POA: Insufficient documentation

## 2019-10-24 DIAGNOSIS — Z79891 Long term (current) use of opiate analgesic: Secondary | ICD-10-CM | POA: Diagnosis not present

## 2019-10-24 DIAGNOSIS — M25511 Pain in right shoulder: Secondary | ICD-10-CM | POA: Diagnosis not present

## 2019-10-24 DIAGNOSIS — K219 Gastro-esophageal reflux disease without esophagitis: Secondary | ICD-10-CM | POA: Diagnosis not present

## 2019-10-24 DIAGNOSIS — I482 Chronic atrial fibrillation, unspecified: Secondary | ICD-10-CM | POA: Diagnosis not present

## 2019-10-24 DIAGNOSIS — G7000 Myasthenia gravis without (acute) exacerbation: Secondary | ICD-10-CM | POA: Diagnosis not present

## 2019-10-24 DIAGNOSIS — E039 Hypothyroidism, unspecified: Secondary | ICD-10-CM | POA: Diagnosis not present

## 2019-10-24 DIAGNOSIS — Z01812 Encounter for preprocedural laboratory examination: Secondary | ICD-10-CM | POA: Insufficient documentation

## 2019-10-24 DIAGNOSIS — I129 Hypertensive chronic kidney disease with stage 1 through stage 4 chronic kidney disease, or unspecified chronic kidney disease: Secondary | ICD-10-CM | POA: Diagnosis not present

## 2019-10-24 DIAGNOSIS — I10 Essential (primary) hypertension: Secondary | ICD-10-CM | POA: Diagnosis not present

## 2019-10-24 DIAGNOSIS — Z950 Presence of cardiac pacemaker: Secondary | ICD-10-CM | POA: Diagnosis not present

## 2019-10-24 DIAGNOSIS — G894 Chronic pain syndrome: Secondary | ICD-10-CM | POA: Diagnosis not present

## 2019-10-24 DIAGNOSIS — F331 Major depressive disorder, recurrent, moderate: Secondary | ICD-10-CM | POA: Diagnosis not present

## 2019-10-24 DIAGNOSIS — N644 Mastodynia: Secondary | ICD-10-CM | POA: Diagnosis not present

## 2019-10-24 DIAGNOSIS — G4700 Insomnia, unspecified: Secondary | ICD-10-CM | POA: Diagnosis not present

## 2019-10-24 DIAGNOSIS — I4891 Unspecified atrial fibrillation: Secondary | ICD-10-CM | POA: Diagnosis not present

## 2019-10-24 DIAGNOSIS — R0602 Shortness of breath: Secondary | ICD-10-CM | POA: Diagnosis not present

## 2019-10-24 DIAGNOSIS — N1831 Chronic kidney disease, stage 3a: Secondary | ICD-10-CM | POA: Diagnosis not present

## 2019-10-24 DIAGNOSIS — E782 Mixed hyperlipidemia: Secondary | ICD-10-CM | POA: Diagnosis not present

## 2019-10-24 LAB — SARS CORONAVIRUS 2 (TAT 6-24 HRS): SARS Coronavirus 2: NEGATIVE

## 2019-10-24 NOTE — Progress Notes (Signed)

## 2019-10-26 ENCOUNTER — Encounter (HOSPITAL_BASED_OUTPATIENT_CLINIC_OR_DEPARTMENT_OTHER): Payer: Self-pay | Admitting: Surgery

## 2019-10-26 NOTE — H&P (Signed)
General Surgery [facility]  [name] DOB: 08-14-1940 Widowed / Language: Cleophus Molt / Race: White Female   History of Present Illness  The patient is a 79 year old female who presents with breast cancer.  CHIEF COMPLAINT: right breast cancer  Patient is referred by Dr. Wende Neighbors for surgical evaluation and management of newly diagnosed right breast carcinoma. Patient had been undergoing routine screening mammography. Study was performed on September 09, 2019. This demonstrated a mass in the right breast with architectural distortion. Ultrasound demonstrated a 8 x 7 x 10 mm irregular hypoechoic mass in the right breast just lateral to the areola in the upper outer quadrant at the 10 o'clock position. Patient underwent core needle biopsy which demonstrated dilated ducts with no malignancy identified. Patient was brought back for a second stereotactic core needle biopsy which revealed invasive ductal carcinoma and ductal carcinoma in situ. Patient is now referred to surgery for management. She has not yet been seen by medical oncology. I suspect that she will be presented at the breast cancer conference this week. Patient has a prior history of benign breast cyst in the left breast which required needle aspiration many years ago by her surgeon in [place], [place]. She has had no other breast surgery. Patient is on Eliquis for atrial fibrillation. She has a pacemaker in place. She is followed by Dr. Crissie Sickles from cardiology.   Problem List/Past Medical  THYROID NODULE (E04.1)  HYPOTHYROIDISM, ADULT (E03.9)  DUCTAL CARCINOMA OF BREAST, RIGHT (C50.911)  DUCTAL CARCINOMA IN SITU OF RIGHT BREAST (D05.11)   Past Surgical History Gallbladder Surgery - Open  Hysterectomy (not due to cancer) - Partial  Knee Surgery  Bilateral.  Diagnostic Studies History  Colonoscopy  5-10 years ago Mammogram  1-3 years ago Pap Smear  1-5 years  ago  Allergies  Ampicillin *PENICILLINS*  Rash. Nitrofurantoin Macrocrystal *URINARY ANTI-INFECTIVES*  Nausea, Vomiting. Biaxin *MACROLIDES*  Rash. Doxycycline Monohydrate *TETRACYCLINES*  Nausea, Vomiting. Moxifloxacin HCl *FLUOROQUINOLONES*  Tetracycline HCl *TETRACYCLINES*  Nausea, Vomiting. Avocado  Diarrhea, Nausea, Vomiting. Allergies Reconciled   Medication History  ALPRAZolam (0.5MG  Tablet, Oral) Active. Atenolol (25MG  Tablet, Oral) Active. Diclofenac Sodium (1% Gel, Transdermal) Active. Ketoconazole (2% Cream, External) Active. Eliquis (5MG  Tablet, Oral) Active. Mycophenolate Mofetil (500MG  Tablet, Oral) Active. Levothyroxine Sodium (112MCG Tablet, Oral) Active. oxyCODONE-Acetaminophen (10-325MG  Tablet, Oral) Active. Pyridostigmine Bromide (60MG  Tablet, Oral) Active. SUMAtriptan Succinate (50MG  Tablet, Oral) Active. Turmeric (400MG  Capsule, Oral) Active. B Complex-Vitamin C (Oral) Active. Vitamin D (1000UNIT Tablet, Oral) Active. Co Q 10 (100MG  Capsule, Oral) Active. Lidoderm (5% Patch, External) Active. Terbinafine (1% Gel, External) Active. traZODone HCl (50MG  Tablet, Oral) Active. predniSONE (5MG  Tablet, Oral) Active. Medications Reconciled  Social History  Caffeine use  Carbonated beverages, Coffee. No alcohol use  No drug use  Tobacco use  Never smoker.  Family History  Arthritis  Father. Cancer  Family Members In General, Father. Cerebrovascular Accident  Daughter, Family Members In General, Mother, Sister. Heart Disease  Family Members In General, Father. Hypertension  Father. Respiratory Condition  Father. Thyroid problems  Mother.  Pregnancy / Birth History  Age at menarche  11 years. Age of menopause  <43 Contraceptive History  Oral contraceptives. Gravida  4 Irregular periods  Length (months) of breastfeeding  3-6 Maternal age  71-20 Para  4  Other Problems  Anxiety Disorder  Arthritis   Atrial Fibrillation  Back Pain  Cancer  Depression  Gastroesophageal Reflux Disease  Heart murmur  High blood pressure  Hypercholesterolemia  Melanoma  Migraine Headache  Thyroid Disease   Vitals  Weight: 187.4 lb Height: 63in Body Surface Area: 1.88 m Body Mass Index: 33.2 kg/m  Temp.: 97.65F (Thermal Scan)  Pulse: 97 (Regular)  BP: 132/80(Sitting, Right Arm, Standard)  Physical Exam  GENERAL APPEARANCE Development: normal Nutritional status: normal Gross deformities: none  SKIN Rash, lesions, ulcers: none Induration, erythema: none Nodules: none palpable  EYES Conjunctiva and lids: normal Pupils: equal and reactive Iris: normal bilaterally  EARS, NOSE, MOUTH, THROAT External ears: no lesion or deformity External nose: no lesion or deformity Hearing: grossly normal Due to Covid-19 pandemic, patient is wearing a mask.  NECK Symmetric: yes Trachea: midline Thyroid: Palpable nodule right thyroid lobe, smooth, firm, nontender  CHEST Respiratory effort: normal Retraction or accessory muscle use: no Breath sounds: normal bilaterally Rales, rhonchi, wheeze: none  CARDIOVASCULAR Auscultation: regular rhythm, normal rate Murmurs: none Pulses: radial pulse 2+ palpable Lower extremity edema: none  BREAST Left breast shows normal nipple areolar complex. Breast parenchyma is soft and uniform without discrete or dominant mass. Left axilla is free of adenopathy. Right breast shows ecchymosis in the upper and lateral portion of the breast consistent with recent core needle biopsies. There is a palpable firm mass measuring approximately 2 cm in size at the 10 o'clock position a few centimeters outside of the edge of the areola. This is discrete and mobile. It is mildly tender. Right axilla is free of adenopathy.  MUSCULOSKELETAL Station and gait: normal Digits and nails: no clubbing or cyanosis Muscle strength: grossly normal all  extremities Range of motion: grossly normal all extremities Deformity: none  LYMPHATIC Cervical: none palpable Supraclavicular: none palpable  PSYCHIATRIC Oriented to person, place, and time: yes Mood and affect: normal for situation Judgment and insight: appropriate for situation    Assessment & Plan   DUCTAL CARCINOMA OF BREAST, RIGHT (C50.911) DUCTAL CARCINOMA IN SITU OF RIGHT BREAST (D05.11)  Patient presents today on referral from her primary care physician with newly diagnosed right breast carcinoma. She is accompanied by her family.  We reviewed her diagnostic studies as well as her biopsy results. These demonstrated a small tumor in the upper outer quadrant of the right breast with cytology showing ductal carcinoma and ductal carcinoma in situ. We discussed options for management. From the surgical standpoint, she will likely require a partial mastectomy with sentinel lymph node biopsy. This can be done as an outpatient surgical procedure or with an overnight surgical stay. We will need to obtain clearance from her cardiologist and advice on management of her anticoagulation in the perioperative interval. Patient will be presented at breast cancer conference this week and we will also move forward with making referral to medical oncology and radiation oncology for evaluation.  We discussed partial mastectomy and sentinel left node biopsy. We discussed the location and size of the surgical wounds. We discussed the possibility of seroma formation. I would like to avoid the use of a drain. Patient and her family are in agreement and wish to proceed in the near future.  The risks and benefits of the procedure have been discussed at length with the patient. The patient understands the proposed procedure, potential alternative treatments, and the course of recovery to be expected. All of the patient's questions have been answered at this time. The patient wishes to proceed  with surgery.  Armandina Gemma, MD [facility] Office: [phone]

## 2019-10-27 ENCOUNTER — Telehealth: Payer: Self-pay | Admitting: Hematology and Oncology

## 2019-10-27 NOTE — Telephone Encounter (Signed)
Scheduled per 8/5 los. Called and left a msg. Daughter called back and confirmed 8/17 appt

## 2019-10-28 ENCOUNTER — Other Ambulatory Visit: Payer: Self-pay

## 2019-10-28 ENCOUNTER — Ambulatory Visit (HOSPITAL_BASED_OUTPATIENT_CLINIC_OR_DEPARTMENT_OTHER)
Admission: RE | Admit: 2019-10-28 | Discharge: 2019-10-28 | Disposition: A | Discharge status: Home / Self Care | Payer: Medicare Other | Attending: Surgery | Admitting: Surgery

## 2019-10-28 ENCOUNTER — Encounter (HOSPITAL_BASED_OUTPATIENT_CLINIC_OR_DEPARTMENT_OTHER)
Admission: RE | Disposition: A | Discharge status: Home / Self Care | Payer: Self-pay | Source: Home / Self Care | Attending: Surgery

## 2019-10-28 ENCOUNTER — Encounter (HOSPITAL_BASED_OUTPATIENT_CLINIC_OR_DEPARTMENT_OTHER): Payer: Self-pay | Admitting: Surgery

## 2019-10-28 ENCOUNTER — Ambulatory Visit (HOSPITAL_BASED_OUTPATIENT_CLINIC_OR_DEPARTMENT_OTHER): Payer: Medicare Other | Admitting: Anesthesiology

## 2019-10-28 DIAGNOSIS — K219 Gastro-esophageal reflux disease without esophagitis: Secondary | ICD-10-CM | POA: Insufficient documentation

## 2019-10-28 DIAGNOSIS — Z170 Estrogen receptor positive status [ER+]: Secondary | ICD-10-CM | POA: Insufficient documentation

## 2019-10-28 DIAGNOSIS — Z888 Allergy status to other drugs, medicaments and biological substances status: Secondary | ICD-10-CM | POA: Insufficient documentation

## 2019-10-28 DIAGNOSIS — E039 Hypothyroidism, unspecified: Secondary | ICD-10-CM | POA: Insufficient documentation

## 2019-10-28 DIAGNOSIS — I509 Heart failure, unspecified: Secondary | ICD-10-CM | POA: Insufficient documentation

## 2019-10-28 DIAGNOSIS — I110 Hypertensive heart disease with heart failure: Secondary | ICD-10-CM | POA: Diagnosis not present

## 2019-10-28 DIAGNOSIS — Z85820 Personal history of malignant melanoma of skin: Secondary | ICD-10-CM | POA: Insufficient documentation

## 2019-10-28 DIAGNOSIS — I2510 Atherosclerotic heart disease of native coronary artery without angina pectoris: Secondary | ICD-10-CM | POA: Insufficient documentation

## 2019-10-28 DIAGNOSIS — K589 Irritable bowel syndrome without diarrhea: Secondary | ICD-10-CM | POA: Diagnosis not present

## 2019-10-28 DIAGNOSIS — Z90710 Acquired absence of both cervix and uterus: Secondary | ICD-10-CM | POA: Diagnosis not present

## 2019-10-28 DIAGNOSIS — Z91018 Allergy to other foods: Secondary | ICD-10-CM | POA: Diagnosis not present

## 2019-10-28 DIAGNOSIS — G7000 Myasthenia gravis without (acute) exacerbation: Secondary | ICD-10-CM | POA: Insufficient documentation

## 2019-10-28 DIAGNOSIS — Z6831 Body mass index (BMI) 31.0-31.9, adult: Secondary | ICD-10-CM | POA: Diagnosis not present

## 2019-10-28 DIAGNOSIS — Z880 Allergy status to penicillin: Secondary | ICD-10-CM | POA: Diagnosis not present

## 2019-10-28 DIAGNOSIS — Z7901 Long term (current) use of anticoagulants: Secondary | ICD-10-CM | POA: Diagnosis not present

## 2019-10-28 DIAGNOSIS — E669 Obesity, unspecified: Secondary | ICD-10-CM | POA: Diagnosis not present

## 2019-10-28 DIAGNOSIS — C50911 Malignant neoplasm of unspecified site of right female breast: Secondary | ICD-10-CM | POA: Diagnosis not present

## 2019-10-28 DIAGNOSIS — N6011 Diffuse cystic mastopathy of right breast: Secondary | ICD-10-CM | POA: Diagnosis not present

## 2019-10-28 DIAGNOSIS — R011 Cardiac murmur, unspecified: Secondary | ICD-10-CM | POA: Insufficient documentation

## 2019-10-28 DIAGNOSIS — C50411 Malignant neoplasm of upper-outer quadrant of right female breast: Secondary | ICD-10-CM | POA: Diagnosis not present

## 2019-10-28 DIAGNOSIS — D0511 Intraductal carcinoma in situ of right breast: Secondary | ICD-10-CM | POA: Diagnosis not present

## 2019-10-28 DIAGNOSIS — I4891 Unspecified atrial fibrillation: Secondary | ICD-10-CM | POA: Diagnosis not present

## 2019-10-28 DIAGNOSIS — I5032 Chronic diastolic (congestive) heart failure: Secondary | ICD-10-CM | POA: Diagnosis not present

## 2019-10-28 DIAGNOSIS — I517 Cardiomegaly: Secondary | ICD-10-CM | POA: Insufficient documentation

## 2019-10-28 DIAGNOSIS — M1990 Unspecified osteoarthritis, unspecified site: Secondary | ICD-10-CM | POA: Insufficient documentation

## 2019-10-28 DIAGNOSIS — F419 Anxiety disorder, unspecified: Secondary | ICD-10-CM | POA: Insufficient documentation

## 2019-10-28 DIAGNOSIS — E7800 Pure hypercholesterolemia, unspecified: Secondary | ICD-10-CM | POA: Insufficient documentation

## 2019-10-28 DIAGNOSIS — Z9049 Acquired absence of other specified parts of digestive tract: Secondary | ICD-10-CM | POA: Insufficient documentation

## 2019-10-28 DIAGNOSIS — Z79899 Other long term (current) drug therapy: Secondary | ICD-10-CM | POA: Insufficient documentation

## 2019-10-28 DIAGNOSIS — F329 Major depressive disorder, single episode, unspecified: Secondary | ICD-10-CM | POA: Diagnosis not present

## 2019-10-28 DIAGNOSIS — Z881 Allergy status to other antibiotic agents status: Secondary | ICD-10-CM | POA: Diagnosis not present

## 2019-10-28 DIAGNOSIS — G43909 Migraine, unspecified, not intractable, without status migrainosus: Secondary | ICD-10-CM | POA: Insufficient documentation

## 2019-10-28 HISTORY — PX: MASTECTOMY, PARTIAL: SHX709

## 2019-10-28 SURGERY — MASTECTOMY PARTIAL
Anesthesia: General | Site: Breast | Laterality: Right

## 2019-10-28 MED ORDER — BUPIVACAINE HCL (PF) 0.5 % IJ SOLN
INTRAMUSCULAR | Status: AC
  Filled 2019-10-28: qty 30

## 2019-10-28 MED ORDER — OXYCODONE HCL 5 MG PO TABS: 5.0000 mg | ORAL_TABLET | Freq: Once | ORAL | Status: DC | PRN

## 2019-10-28 MED ORDER — FENTANYL CITRATE (PF) 100 MCG/2ML IJ SOLN
INTRAMUSCULAR | Status: AC
  Filled 2019-10-28: qty 2

## 2019-10-28 MED ORDER — MEPERIDINE HCL 25 MG/ML IJ SOLN
INTRAMUSCULAR | Status: AC
  Filled 2019-10-28: qty 1

## 2019-10-28 MED ORDER — VANCOMYCIN HCL IN DEXTROSE 1-5 GM/200ML-% IV SOLN
1000.0000 mg | INTRAVENOUS | Status: AC
  Administered 2019-10-28: 1000 mg via INTRAVENOUS

## 2019-10-28 MED ORDER — BUPIVACAINE HCL (PF) 0.5 % IJ SOLN
INTRAMUSCULAR | Status: DC | PRN
  Administered 2019-10-28: 20 mL

## 2019-10-28 MED ORDER — LACTATED RINGERS IV SOLN: INTRAVENOUS | Status: DC

## 2019-10-28 MED ORDER — PROPOFOL 10 MG/ML IV BOLUS
INTRAVENOUS | Status: DC | PRN
  Administered 2019-10-28: 130 mg via INTRAVENOUS

## 2019-10-28 MED ORDER — DEXAMETHASONE SODIUM PHOSPHATE 10 MG/ML IJ SOLN
INTRAMUSCULAR | Status: DC | PRN
  Administered 2019-10-28: 5 mg via INTRAVENOUS

## 2019-10-28 MED ORDER — FENTANYL CITRATE (PF) 100 MCG/2ML IJ SOLN
25.0000 ug | INTRAMUSCULAR | Status: DC | PRN
  Administered 2019-10-28: 25 ug via INTRAVENOUS

## 2019-10-28 MED ORDER — ONDANSETRON HCL 4 MG/2ML IJ SOLN: 4.0000 mg | Freq: Once | INTRAMUSCULAR | Status: DC | PRN

## 2019-10-28 MED ORDER — VANCOMYCIN HCL IN DEXTROSE 1-5 GM/200ML-% IV SOLN
INTRAVENOUS | Status: AC
  Filled 2019-10-28: qty 200

## 2019-10-28 MED ORDER — MEPERIDINE HCL 25 MG/ML IJ SOLN
6.2500 mg | Freq: Once | INTRAMUSCULAR | Status: AC | PRN
  Administered 2019-10-28: 6.25 mg via INTRAVENOUS

## 2019-10-28 MED ORDER — CHLORHEXIDINE GLUCONATE CLOTH 2 % EX PADS: 6.0000 | MEDICATED_PAD | Freq: Once | CUTANEOUS | Status: DC

## 2019-10-28 MED ORDER — FENTANYL CITRATE (PF) 100 MCG/2ML IJ SOLN
INTRAMUSCULAR | Status: DC | PRN
  Administered 2019-10-28 (×3): 25 ug via INTRAVENOUS
  Administered 2019-10-28: 50 ug via INTRAVENOUS

## 2019-10-28 MED ORDER — LIDOCAINE HCL (CARDIAC) PF 100 MG/5ML IV SOSY
PREFILLED_SYRINGE | INTRAVENOUS | Status: DC | PRN
  Administered 2019-10-28: 60 mg via INTRAVENOUS

## 2019-10-28 MED ORDER — OXYCODONE HCL 5 MG/5ML PO SOLN: 5.0000 mg | Freq: Once | ORAL | Status: DC | PRN

## 2019-10-28 MED ORDER — ONDANSETRON HCL 4 MG/2ML IJ SOLN
INTRAMUSCULAR | Status: DC | PRN
  Administered 2019-10-28: 4 mg via INTRAVENOUS

## 2019-10-28 SURGICAL SUPPLY — 48 items
ADH SKN CLS APL DERMABOND .7 (GAUZE/BANDAGES/DRESSINGS) ×1
APL PRP STRL LF DISP 70% ISPRP (MISCELLANEOUS) ×1
APPLIER CLIP 9.375 MED OPEN (MISCELLANEOUS) ×3
APR CLP MED 9.3 20 MLT OPN (MISCELLANEOUS) ×1
BINDER BREAST XLRG (GAUZE/BANDAGES/DRESSINGS) ×2 IMPLANT
BLADE SURG 15 STRL LF DISP TIS (BLADE) ×1 IMPLANT
BLADE SURG 15 STRL SS (BLADE) ×3
CANISTER SUCT 1200ML W/VALVE (MISCELLANEOUS) ×2 IMPLANT
CHLORAPREP W/TINT 26 (MISCELLANEOUS) ×3 IMPLANT
CLIP APPLIE 9.375 MED OPEN (MISCELLANEOUS) IMPLANT
CLOSURE WOUND 1/2 X4 (GAUZE/BANDAGES/DRESSINGS)
COVER BACK TABLE 60X90IN (DRAPES) ×3 IMPLANT
COVER MAYO STAND STRL (DRAPES) ×3 IMPLANT
COVER WAND RF STERILE (DRAPES) IMPLANT
DECANTER SPIKE VIAL GLASS SM (MISCELLANEOUS) IMPLANT
DERMABOND ADVANCED (GAUZE/BANDAGES/DRESSINGS) ×2
DERMABOND ADVANCED .7 DNX12 (GAUZE/BANDAGES/DRESSINGS) IMPLANT
DRAPE LAPAROSCOPIC ABDOMINAL (DRAPES) ×2 IMPLANT
DRAPE LAPAROTOMY 100X72 PEDS (DRAPES) ×1 IMPLANT
DRAPE UTILITY XL STRL (DRAPES) ×3 IMPLANT
ELECT REM PT RETURN 9FT ADLT (ELECTROSURGICAL) ×3
ELECTRODE REM PT RTRN 9FT ADLT (ELECTROSURGICAL) ×1 IMPLANT
GAUZE SPONGE 4X4 12PLY STRL LF (GAUZE/BANDAGES/DRESSINGS) ×1 IMPLANT
GLOVE BIOGEL PI IND STRL 7.0 (GLOVE) IMPLANT
GLOVE BIOGEL PI INDICATOR 7.0 (GLOVE) ×4
GLOVE ECLIPSE 6.5 STRL STRAW (GLOVE) ×2 IMPLANT
GLOVE SURG ORTHO 8.0 STRL STRW (GLOVE) ×3 IMPLANT
GOWN STRL REUS W/ TWL LRG LVL3 (GOWN DISPOSABLE) ×1 IMPLANT
GOWN STRL REUS W/ TWL XL LVL3 (GOWN DISPOSABLE) ×1 IMPLANT
GOWN STRL REUS W/TWL LRG LVL3 (GOWN DISPOSABLE) ×3
GOWN STRL REUS W/TWL XL LVL3 (GOWN DISPOSABLE) ×3
KIT MARKER MARGIN INK (KITS) ×2 IMPLANT
NDL HYPO 25X1 1.5 SAFETY (NEEDLE) ×1 IMPLANT
NEEDLE HYPO 25X1 1.5 SAFETY (NEEDLE) ×3 IMPLANT
NS IRRIG 1000ML POUR BTL (IV SOLUTION) ×3 IMPLANT
PACK BASIN DAY SURGERY FS (CUSTOM PROCEDURE TRAY) ×3 IMPLANT
PENCIL SMOKE EVACUATOR (MISCELLANEOUS) ×3 IMPLANT
SLEEVE SCD COMPRESS KNEE MED (MISCELLANEOUS) ×2 IMPLANT
STRIP CLOSURE SKIN 1/2X4 (GAUZE/BANDAGES/DRESSINGS) ×1 IMPLANT
SUT SILK 2 0 SH (SUTURE) IMPLANT
SUT VICRYL 3-0 CR8 SH (SUTURE) ×3 IMPLANT
SUT VICRYL 4-0 PS2 18IN ABS (SUTURE) ×1 IMPLANT
SYR CONTROL 10ML LL (SYRINGE) ×3 IMPLANT
TOWEL GREEN STERILE FF (TOWEL DISPOSABLE) ×6 IMPLANT
TRAY FAXITRON CT DISP (TRAY / TRAY PROCEDURE) IMPLANT
TUBE CONNECTING 20'X1/4 (TUBING) ×1
TUBE CONNECTING 20X1/4 (TUBING) ×1 IMPLANT
YANKAUER SUCT BULB TIP NO VENT (SUCTIONS) ×2 IMPLANT

## 2019-10-28 NOTE — Anesthesia Preprocedure Evaluation (Addendum)
Anesthesia Evaluation  Patient identified by MRN, date of birth, ID band Patient awake    Reviewed: Allergy & Precautions, NPO status , Patient's Chart, lab work & pertinent test results  History of Anesthesia Complications Negative for: history of anesthetic complications  Airway Mallampati: II  TM Distance: >3 FB Neck ROM: Full    Dental  (+) Dental Advisory Given   Pulmonary neg pulmonary ROS,    Pulmonary exam normal        Cardiovascular hypertension, Pt. on home beta blockers and Pt. on medications + CAD and +CHF  Normal cardiovascular exam+ dysrhythmias Atrial Fibrillation + pacemaker    '21 TTE - EF 60 to 65%. Mild left ventricular hypertrophy.  Grade I diastolic dysfunction (impaired relaxation). Trivial MR.  '20 Cath - Normal left ventricular function and hemodynamics. Normal right heart pressures and oximetry. Right dominant coronary anatomy Normal left main, RCA, circumflex, and LAD.  First diagonal contains proximal 40% narrowing.     Neuro/Psych  Headaches, PSYCHIATRIC DISORDERS Anxiety Depression  Neuromuscular disease (Myasthenia gravis on mestinon)    GI/Hepatic Neg liver ROS, GERD  Medicated and Controlled, Chronic nausea IBS    Endo/Other  Hypothyroidism  Obesity   Renal/GU negative Renal ROS     Musculoskeletal  (+) Arthritis , Osteoarthritis,    Abdominal   Peds  Hematology  On eliquis    Anesthesia Other Findings Covid test negative   Reproductive/Obstetrics                            Anesthesia Physical Anesthesia Plan  ASA: III  Anesthesia Plan: General   Post-op Pain Management:    Induction: Intravenous  PONV Risk Score and Plan: 3 and Treatment may vary due to age or medical condition, Ondansetron and Dexamethasone  Airway Management Planned: LMA  Additional Equipment: None  Intra-op Plan:   Post-operative Plan: Extubation in  OR  Informed Consent: I have reviewed the patients History and Physical, chart, labs and discussed the procedure including the risks, benefits and alternatives for the proposed anesthesia with the patient or authorized representative who has indicated his/her understanding and acceptance.     Dental advisory given  Plan Discussed with: CRNA and Anesthesiologist  Anesthesia Plan Comments:        Anesthesia Quick Evaluation

## 2019-10-28 NOTE — Discharge Instructions (Signed)

## 2019-10-28 NOTE — Anesthesia Postprocedure Evaluation (Signed)
Anesthesia Post Note  Patient: [name]  Procedure(s) Performed: RIGHT BREAST PARTIAL MASTECTOMY (Right Breast)     Patient location during evaluation: PACU Anesthesia Type: General Level of consciousness: awake and alert Pain management: pain level controlled Vital Signs Assessment: post-procedure vital signs reviewed and stable Respiratory status: spontaneous breathing, nonlabored ventilation and respiratory function stable Cardiovascular status: blood pressure returned to baseline and stable Postop Assessment: no apparent nausea or vomiting Anesthetic complications: no   No complications documented.  Last Vitals:  Vitals:   10/28/19 1145 10/28/19 1200  BP: (!) 149/86 (!) 155/67  Pulse: 60 (!) 59  Resp: 18 16  Temp:    SpO2: 100% 99%    Last Pain:  Vitals:   10/28/19 1200  TempSrc:   PainSc: [place]

## 2019-10-28 NOTE — Transfer of Care (Signed)
Immediate Anesthesia Transfer of Care Note  Patient: [name]  Procedure(s) Performed: RIGHT BREAST PARTIAL MASTECTOMY (Right Breast)  Patient Location: PACU  Anesthesia Type:General  Level of Consciousness: awake, alert  and oriented  Airway & Oxygen Therapy: Patient Spontanous Breathing and Patient connected to face mask oxygen  Post-op Assessment: Report given to RN and Post -op Vital signs reviewed and stable  Post vital signs: Reviewed and stable  Last Vitals:  Vitals Value Taken Time  BP 155/100 10/28/19 1110  Temp    Pulse 59 10/28/19 1113  Resp 20 10/28/19 1113  SpO2 100 % 10/28/19 1113  Vitals shown include unvalidated device data.  Last Pain:  Vitals:   10/28/19 0916  TempSrc: Oral  PainSc: 0-No pain      Patients Stated Pain Goal: 3 (57/84/69 [id number])  Complications: No complications documented.

## 2019-10-28 NOTE — Anesthesia Procedure Notes (Signed)
Procedure Name: LMA Insertion Date/Time: 10/28/2019 10:13 AM Performed by: Lavonia Dana, CRNA Pre-anesthesia Checklist: Patient identified, Emergency Drugs available, Suction available and Patient being monitored Patient Re-evaluated:Patient Re-evaluated prior to induction Oxygen Delivery Method: Circle system utilized Preoxygenation: Pre-oxygenation with 100% oxygen Induction Type: IV induction Ventilation: Mask ventilation without difficulty LMA: LMA inserted LMA Size: 4.0 Number of attempts: 1 Airway Equipment and Method: Bite block Placement Confirmation: positive ETCO2 Tube secured with: Tape Dental Injury: Teeth and Oropharynx as per pre-operative assessment

## 2019-10-28 NOTE — Op Note (Signed)
Operative Note  Pre-operative Diagnosis:  Right breast carcinoma  Post-operative Diagnosis:  same  Surgeon:  Armandina Gemma, MD  Assistant:  none   Procedure:  Right partial mastectomy  Anesthesia:  general  Estimated Blood Loss:  minimal  Drains: none         Specimen: to pathology  Indications:  Patient is referred by Dr. Wende Neighbors for surgical evaluation and management of newly diagnosed right breast carcinoma. Patient had been undergoing routine screening mammography. Study was performed on September 09, 2019. This demonstrated a mass in the right breast with architectural distortion. Ultrasound demonstrated a 8 x 7 x 10 mm irregular hypoechoic mass in the right breast just lateral to the areola in the upper outer quadrant at the 10 o'clock position. Patient underwent core needle biopsy which demonstrated dilated ducts with no malignancy identified. Patient was brought back for a second stereotactic core needle biopsy which revealed invasive ductal carcinoma and ductal carcinoma in situ. Patient is now referred to surgery for management. She has not yet been seen by medical oncology. I suspect that she will be presented at the breast cancer conference this week. Patient has a prior history of benign breast cyst in the left breast which required needle aspiration many years ago by her surgeon in [place], [place]. She has had no other breast surgery. Patient is on Eliquis for atrial fibrillation. She has a pacemaker in place. She is followed by Dr. Crissie Sickles from cardiology.  Procedure:  The patient was seen in the pre-op holding area. The risks, benefits, complications, treatment options, and expected outcomes were previously discussed with the patient. The patient agreed with the proposed plan and has signed the informed consent form.  The patient was brought to the operating room by the surgical team, identified as [name] and the procedure verified. A "time out"  was completed and the above information confirmed.  Following induction of general anesthesia, the patient is positioned and then prepped and draped in the usual aseptic fashion.  The mass in the upper outer quadrant of the right breast is palpable approximately 2 cm outside of the edge of the areola.  A curvilinear incision is made over the mass for approximately 6 cm.  Skin flaps are elevated circumferentially using the electrocautery.  The underlying breast tissue is then resected with the mass located centrally by palpation.  Dissection is carried from just beneath the nipple down to the chest wall and around the mass up to the axillary tail.  Specimen is completely excised using the electrocautery for hemostasis.  After excision of the specimen, the specimen is oriented and marked with 6 different colors of paint.  It is then submitted to pathology for review.  The margins of resection are marked with medium titanium ligaclips.  Good hemostasis is noted throughout the wound.  Incision is closed in layers with interrupted 3-0 Vicryl subcutaneous sutures followed by a running 4-0 Monocryl subcuticular suture.  Wound is washed and dried and Dermabond is applied as dressing.  Local anesthetic is infiltrated around the surgical site.  A binder is placed prior to moving the patient to the recovery room.  The patient tolerated the procedure well.   Armandina Gemma, MD [facility] Office: [phone]

## 2019-10-28 NOTE — Interval H&P Note (Signed)
History and Physical Interval Note:  10/28/2019 9:39 AM  [name]  has presented today for surgery, with the diagnosis of RIGHT BREAST CARCINOMA.  The various methods of treatment have been discussed with the patient and family. After consideration of risks, benefits and other options for treatment, the patient has consented to    Procedure(s): RIGHT BREAST PARTIAL MASTECTOMY (Right) as a surgical intervention.    The patient's history has been reviewed, patient examined, no change in status, stable for surgery.  I have reviewed the patient's chart and labs.  Questions were answered to the patient's satisfaction.    Armandina Gemma, MD [facility] Office: [place]

## 2019-10-29 ENCOUNTER — Encounter (HOSPITAL_BASED_OUTPATIENT_CLINIC_OR_DEPARTMENT_OTHER): Payer: Self-pay | Admitting: Surgery

## 2019-10-29 LAB — SURGICAL PATHOLOGY

## 2019-10-30 ENCOUNTER — Encounter: Payer: Self-pay | Admitting: *Deleted

## 2019-10-30 ENCOUNTER — Other Ambulatory Visit: Payer: Self-pay | Admitting: Neurology

## 2019-11-03 DIAGNOSIS — H26491 Other secondary cataract, right eye: Secondary | ICD-10-CM | POA: Diagnosis not present

## 2019-11-03 DIAGNOSIS — H5213 Myopia, bilateral: Secondary | ICD-10-CM | POA: Diagnosis not present

## 2019-11-04 ENCOUNTER — Ambulatory Visit (INDEPENDENT_AMBULATORY_CARE_PROVIDER_SITE_OTHER): Payer: Medicare Other | Admitting: *Deleted

## 2019-11-04 ENCOUNTER — Encounter: Payer: Self-pay | Admitting: *Deleted

## 2019-11-04 ENCOUNTER — Telehealth: Payer: Self-pay | Admitting: Hematology and Oncology

## 2019-11-04 ENCOUNTER — Other Ambulatory Visit: Payer: Self-pay

## 2019-11-04 ENCOUNTER — Inpatient Hospital Stay (HOSPITAL_BASED_OUTPATIENT_CLINIC_OR_DEPARTMENT_OTHER): Payer: Medicare Other | Admitting: Hematology and Oncology

## 2019-11-04 DIAGNOSIS — Z170 Estrogen receptor positive status [ER+]: Secondary | ICD-10-CM | POA: Diagnosis not present

## 2019-11-04 DIAGNOSIS — R001 Bradycardia, unspecified: Secondary | ICD-10-CM

## 2019-11-04 DIAGNOSIS — E782 Mixed hyperlipidemia: Secondary | ICD-10-CM | POA: Diagnosis not present

## 2019-11-04 DIAGNOSIS — C50411 Malignant neoplasm of upper-outer quadrant of right female breast: Secondary | ICD-10-CM

## 2019-11-04 DIAGNOSIS — E039 Hypothyroidism, unspecified: Secondary | ICD-10-CM | POA: Diagnosis not present

## 2019-11-04 DIAGNOSIS — K219 Gastro-esophageal reflux disease without esophagitis: Secondary | ICD-10-CM | POA: Diagnosis not present

## 2019-11-04 DIAGNOSIS — I480 Paroxysmal atrial fibrillation: Secondary | ICD-10-CM | POA: Diagnosis not present

## 2019-11-04 LAB — CUP PACEART REMOTE DEVICE CHECK
Battery Remaining Longevity: 129 mo
Battery Voltage: 3.02 V
Brady Statistic AP VP Percent: 0.04 %
Brady Statistic AP VS Percent: 97.43 %
Brady Statistic AS VP Percent: 0 %
Brady Statistic AS VS Percent: 2.53 %
Brady Statistic RA Percent Paced: 97.57 %
Brady Statistic RV Percent Paced: 0.04 %
Date Time Interrogation Session: 20210817013442
Implantable Lead Implant Date: 20190501
Implantable Lead Implant Date: 20190501
Implantable Lead Location: 753859
Implantable Lead Location: 753860
Implantable Lead Model: 3830
Implantable Lead Model: 5076
Implantable Pulse Generator Implant Date: 20190501
Lead Channel Impedance Value: 323 Ohm
Lead Channel Impedance Value: 361 Ohm
Lead Channel Impedance Value: 399 Ohm
Lead Channel Impedance Value: 475 Ohm
Lead Channel Pacing Threshold Amplitude: 0.625 V
Lead Channel Pacing Threshold Amplitude: 0.75 V
Lead Channel Pacing Threshold Pulse Width: 0.4 ms
Lead Channel Pacing Threshold Pulse Width: 0.4 ms
Lead Channel Sensing Intrinsic Amplitude: 1.125 mV
Lead Channel Sensing Intrinsic Amplitude: 1.125 mV
Lead Channel Sensing Intrinsic Amplitude: 10.125 mV
Lead Channel Sensing Intrinsic Amplitude: 10.125 mV
Lead Channel Setting Pacing Amplitude: 2 V
Lead Channel Setting Pacing Amplitude: 2.5 V
Lead Channel Setting Pacing Pulse Width: 0.4 ms
Lead Channel Setting Sensing Sensitivity: 0.9 mV

## 2019-11-04 MED ORDER — PREDNISONE 5 MG PO TABS: 10.0000 mg | ORAL_TABLET | Freq: Every day | ORAL | Status: DC

## 2019-11-04 MED ORDER — ANASTROZOLE 1 MG PO TABS
1.0000 mg | ORAL_TABLET | Freq: Every day | ORAL | 3 refills | Status: DC
Start: 2019-11-04 — End: 2020-07-19

## 2019-11-04 NOTE — Telephone Encounter (Signed)
Scheduled per 8/17 los. Printed avs and calendar for pt. 

## 2019-11-04 NOTE — Progress Notes (Signed)
Patient Care Team: [name], MD as PCP - General (Internal Medicine) Herminio Commons, MD (Inactive) as PCP - Cardiology (Cardiology) Vaughan Basta, Rona Ravens, NP (Inactive) as Nurse Practitioner (Internal Medicine)  DIAGNOSIS:    ICD-10-CM   1. Malignant neoplasm of upper-outer quadrant of right breast in female, estrogen receptor positive ([place])  C50.411    Z17.0     SUMMARY OF ONCOLOGIC HISTORY: Oncology History Overview Note  [place] Gene Abnormality Annual 24 hr urine collection for VMA's and catecholamines   Paraganglioma, malignant ([place])  01/01/2012 Imaging   CT abdomen/pelvis no acute findings identified within the abdomen/pelvis. Slight increase in size of enlarge and enhancing periaortic LN   01/10/2012 PET scan   Intensely hypermetabolic solitary L periaortic LN is concerning for a lymphoproliferative process vs a solitary metastasis. Diffuse intensely hypermetabolic thyroid activity is most consistent with a benign thyroiditis    01/16/2012 Initial Biopsy   IR CT guided biopsy of L para aortic lymph node   01/16/2012 Pathology Results   Low grade neoplasm with neuroendocrine differentiation. DDX includes paraganglioma, pheochromocytoma, and carcinoid   01/30/2012 Imaging   US soft tissue head/neck diffusely enlarged hypervascular nodular thyroid gland compatible with thyroiditis   03/01/2012 Surgery   Exploratory laparotomy, resection of retroperitoneal neuroendocrine tumor 3 x 2 x 2 cm with Dr. Armandina Gemma   03/01/2012 Pathology Results   Paraganglioma with associated fibrosis 2.5 cm, negative margins   04/17/2014 PET scan   There is no evidence for residual or recurrent hypermetabolic tumor or adenopathy. 2. Diffusely increased radiotracer uptake throughout both lobes of the thyroid gland. Correlation with patient's TSH is recommended.    09/10/2015 Imaging   MRI neck with/without contrast at [facility]. No neck mass or LAD. Likely mildly asymmetric lingual  tonsil tissue in the L vallecular. 1.3 cm likely calcified L thyroid nodule.    10/08/2015 Imaging   CT C/A/P No evidence for residual or recurrent tumor or adenopathy.   06/15/2016 Imaging   MRI brain- No acute abnormality.  Negative for metastatic disease  Several small subcortical white matter hyperintensities have developed since 2013. This is most likely related to chronic microvascular ischemia.   06/15/2016 Imaging   CT CAP-   1. No evidence of thoracic metastasis. 1. No evidence of metastatic disease in the abdomen pelvis. 2. No periaortic adenopathy. 3. Sigmoid diverticulosis.  No diverticulitis.   Malignant neoplasm of upper-outer quadrant of right breast in female, estrogen receptor positive ([place])  10/14/2019 Cancer Staging   Staging form: Breast, AJCC 8th Edition - Clinical stage from 10/14/2019: Stage IA (cT1b, cN0, cM0, G2, ER+, PR-, HER2-) - Signed by Gardenia Phlegm, NP on 10/22/2019   10/22/2019 Initial Diagnosis   Screening mammogram showed a right breast mass. Diagnostic mammogram and US showed a 1.0cm right breast mass at the 10 o'clock position, no right axillary adenopathy. Biopsy showed IDC with DCIS, grade 2, HER-2 equivocal by IHC, negative by FISH, ER+ 95%, PR- 0%, Ki67 2%.   10/28/2019 Surgery   Right lumpectomy ([place]): IDC, 1.4cm, grade 2, clear margins     CHIEF COMPLIANT: Follow-up s/p right lumpectomy to review pathology  INTERVAL HISTORY: [name] is a 79 y.o. with above-mentioned history of right breast cancer. She underwent a right lumpectomy with Dr. Harlow Asa on 10/28/19 for which pathology confirmed invasive ductal carcinoma, 1.4cm, grade 2, clear margins. She presents to the clinic today to review the pathology report and discuss further treatment.   ALLERGIES:  is  allergic to nitrofurantoin macrocrystal, ampicillin, avocado, biaxin [clarithromycin], doxycycline monohydrate, moxifloxacin, and tetracyclines & related.  MEDICATIONS:    Current Outpatient Medications  Medication Sig Dispense Refill  . ALPRAZolam (XANAX) 0.5 MG tablet Take 1 mg by mouth 2 (two) times daily.    Marland Kitchen anastrozole (ARIMIDEX) 1 MG tablet Take 1 tablet (1 mg total) by mouth daily. 90 tablet 3  . apixaban (ELIQUIS) 5 MG TABS tablet Take 1 tablet (5 mg total) by mouth 2 (two) times daily. 60 tablet 5  . Artificial Tear Solution (SOOTHE XP OP) Place 1 drop into both eyes 4 (four) times daily.    Marland Kitchen atenolol (TENORMIN) 25 MG tablet TAKE (1) TABLET BY MOUTH ONCE DAILY. MAY TAKE AN ADDITIONAL TABLET IF SYSTOLIC OVER 237 OR SEVERE PALPITATIONS. 120 tablet 2  . atorvastatin (LIPITOR) 80 MG tablet Take 80 mg by mouth daily.    . B Complex-C (B-COMPLEX WITH VITAMIN C) tablet Take 1 tablet by mouth daily with supper.     . cholecalciferol (VITAMIN D) 1000 units tablet Take 1,000 Units by mouth 2 (two) times a day.     . Coenzyme Q10 (COQ10) 100 MG CAPS Take 100 mg by mouth daily.     . diclofenac sodium (VOLTAREN) 1 % GEL Apply 4 g topically 4 (four) times daily as needed (for pain).     Marland Kitchen ketoconazole (NIZORAL) 2 % cream Apply 1 application topically 2 (two) times daily. applied under breasts    . lansoprazole (PREVACID) 30 MG capsule TAKE (1) CAPSULE BY MOUTH TWICE DAILY. (Patient taking differently: daily at 12 noon. TAKE (1) CAPSULE BY MOUTH TWICE DAILY.) 180 capsule 0  . levothyroxine (SYNTHROID) 112 MCG tablet Take 112 mcg by mouth daily before breakfast.    . lidocaine (LIDODERM) 5 % Place 1-3 patches onto the skin daily as needed (for pain). Remove & Discard patch within 12 hours or as directed by MD for pain    . mycophenolate (CELLCEPT) 500 MG tablet Take 1 tablet (500 mg total) by mouth 2 (two) times daily. 180 tablet 3  . oxyCODONE-acetaminophen (PERCOCET) 10-325 MG tablet Take 1 tablet by mouth every 6 (six) hours as needed (pain).     . predniSONE (DELTASONE) 5 MG tablet Take 2 tablets (10 mg total) by mouth daily with breakfast.    . pyridostigmine  (MESTINON) 60 MG tablet TAKE (1/2) TABLET BY MOUTH FOUR TIMES DAILY. 180 tablet 1  . SUMAtriptan (IMITREX) 50 MG tablet Take 50 mg by mouth every 2 (two) hours as needed. For migraines    . Terbinafine 1 % GEL Apply 1 application topically 2 (two) times daily as needed (toe nail fungus).     . traZODone (DESYREL) 50 MG tablet TAKE (1) TABLET BY MOUTH AT BEDTIME. 90 tablet 0  . TURMERIC PO Take 1,000 mg by mouth daily with supper. Take twice a day     Current Facility-Administered Medications  Medication Dose Route Frequency Provider Last Rate Last Admin  . sodium chloride flush (NS) 0.9 % injection 3 mL  3 mL Intravenous Q12H Herminio Commons, MD        PHYSICAL EXAMINATION: ECOG PERFORMANCE STATUS: 1 - Symptomatic but completely ambulatory  Vitals:   11/04/19 1307  BP: 136/69  Pulse: 80  Resp: 18  Temp: 98.6 F (37 C)  SpO2: 99%   Filed Weights   11/04/19 1307  Weight: 186 lb 3.2 oz (84.5 kg)    LABORATORY DATA:  I have reviewed the data  as listed CMP Latest Ref Rng & Units 05/22/2019 03/10/2019 11/28/2018  Glucose 65 - 99 mg/dL 95 92 92  BUN 8 - 27 mg/dL 18 15 15   Creatinine 0.57 - 1.00 mg/dL 1.15(H) 1.24(H) 1.21(H)  Sodium 134 - 144 mmol/L 142 141 139  Potassium 3.5 - 5.2 mmol/L 4.3 3.8 4.6  Chloride 96 - 106 mmol/L 106 103 104  CO2 20 - 29 mmol/L 21 20 23   Calcium 8.7 - 10.3 mg/dL 10.2 10.2 9.8  Total Protein 6.0 - 8.5 g/dL 7.0 7.3 7.2  Total Bilirubin 0.0 - 1.2 mg/dL 0.5 0.6 0.4  Alkaline Phos 39 - 117 IU/L 53 71 78  AST 0 - 40 IU/L 17 17 12   ALT 0 - 32 IU/L 9 9 10     Lab Results  Component Value Date   WBC 7.8 05/22/2019   HGB 14.2 05/22/2019   HCT 42.7 05/22/2019   MCV 99 (H) 05/22/2019   PLT 184 05/22/2019   NEUTROABS 4.8 05/22/2019    ASSESSMENT & PLAN:  Malignant neoplasm of upper-outer quadrant of right breast in female, estrogen receptor positive ([place]) Right lumpectomy: 10/28/2019: Grade 2 IDC 1.4 cm, intermediate grade DCIS, margins negative,  suspicious for LVI, ER 95%, PR 0%, HER-2 negative, Ki-67 2% T1c Nx stage Ia  Pathology counseling: I discussed the final pathology report of the patient provided  a copy of this report. I discussed the margins. Lymph node evaluation was not done because of her age. We also discussed the final staging along with previously performed ER/PR and HER-2/neu testing.  Recommendation: Adjuvant antiestrogen therapy with anastrozole 1 mg daily x5 years  We discussed the risks and benefits of radiation and patient and her daughter both expressed that they do not want to receive radiation.  Anastrozole counseling:We discussed the risks and benefits of anti-estrogen therapy with aromatase inhibitors. These include but not limited to insomnia, hot flashes, mood changes, vaginal dryness, bone density loss, and weight gain. We strongly believe that the benefits far outweigh the risks. Patient understands these risks and consented to starting treatment. Planned treatment duration is 5 years.  Return to clinic in 3 months for survivorship care plan visit  No orders of the defined types were placed in this encounter.  The patient has a good understanding of the overall plan. she agrees with it. she will call with any problems that may develop before the next visit here.  Total time spent: 30 mins including face to face time and time spent for planning, charting and coordination of care  Nicholas Lose, MD 11/04/2019  I, Cloyde Reams Dorshimer, am acting as scribe for Dr. Nicholas Lose.  I have reviewed the above documentation for accuracy and completeness, and I agree with the above.

## 2019-11-04 NOTE — Assessment & Plan Note (Signed)
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 I discussed the margins. Lymph node evaluation was not done because of her age. We also discussed the final staging along with previously performed ER/PR and HER-2/neu testing.  Recommendation: Adjuvant antiestrogen therapy with anastrozole 1 mg daily x5 years Anastrozole counseling:We discussed the risks and benefits of anti-estrogen therapy with aromatase inhibitors. These include but not limited to insomnia, hot flashes, mood changes, vaginal dryness, bone density loss, and weight gain. We strongly believe that the benefits far outweigh the risks. Patient understands these risks and consented to starting treatment. Planned treatment duration is 5 years.  Return to clinic in 3 months for survivorship care plan visit

## 2019-11-06 NOTE — Progress Notes (Signed)
Remote pacemaker transmission.   

## 2019-11-13 ENCOUNTER — Other Ambulatory Visit: Payer: Self-pay | Admitting: *Deleted

## 2019-11-13 ENCOUNTER — Telehealth: Payer: Self-pay | Admitting: *Deleted

## 2019-11-13 DIAGNOSIS — C50411 Malignant neoplasm of upper-outer quadrant of right female breast: Secondary | ICD-10-CM

## 2019-11-13 DIAGNOSIS — Z79811 Long term (current) use of aromatase inhibitors: Secondary | ICD-10-CM

## 2019-11-13 NOTE — Telephone Encounter (Signed)
Received VM from pt.  Attempt x1 to return call, no answer and pt VM not set up.

## 2019-12-01 ENCOUNTER — Ambulatory Visit (INDEPENDENT_AMBULATORY_CARE_PROVIDER_SITE_OTHER): Payer: Medicare Other | Admitting: Neurology

## 2019-12-01 ENCOUNTER — Encounter: Payer: Self-pay | Admitting: Neurology

## 2019-12-01 VITALS — BP 128/70 | HR 64 | Ht 64.0 in | Wt 187.0 lb

## 2019-12-01 DIAGNOSIS — Z5181 Encounter for therapeutic drug level monitoring: Secondary | ICD-10-CM

## 2019-12-01 DIAGNOSIS — Z471 Aftercare following joint replacement surgery: Secondary | ICD-10-CM | POA: Diagnosis not present

## 2019-12-01 DIAGNOSIS — Z96653 Presence of artificial knee joint, bilateral: Secondary | ICD-10-CM | POA: Diagnosis not present

## 2019-12-01 DIAGNOSIS — G7000 Myasthenia gravis without (acute) exacerbation: Secondary | ICD-10-CM | POA: Diagnosis not present

## 2019-12-01 MED ORDER — PYRIDOSTIGMINE BROMIDE 60 MG PO TABS: 30.0000 mg | ORAL_TABLET | Freq: Four times a day (QID) | ORAL | 3 refills | Status: DC

## 2019-12-01 NOTE — Progress Notes (Signed)
Reason for visit: Ocular myasthenia gravis  [name] is an 79 y.o. female  History of present illness:  [name] is an 79 year old right-handed white female with a history of myasthenia gravis primarily with ocular features.  The patient still has some double vision without ptosis.  She has recently been treated for breast cancer in the right breast, it is stage IA.  She will not require any chemotherapy or radiation.  She has had some problems with fatigue and shortness of breath that markedly improved on 10 mg of prednisone daily.  When she tried to cut back on the medication she had significant recurrence of her fatigue.  She remains active, she does a lot of yard work.  She does have chronic gait instability, she uses a walker for ambulation.  She has not had any falls.  The patient sleeps well on trazodone.  She does have some arthritis pain involving the right hip and right knee and is followed through orthopedic surgery for this.  She returns to the office today for an evaluation.  She currently is on Mestinon taking one half of a 60 mg tablet 4 times daily, she is on prednisone 10 mg daily, she is on CellCept.  Past Medical History:  Diagnosis Date  . Anemia   . Breast cancer ([place]) 09/2019   right breast   . Chronic back pain   . Chronic nausea   . Complication of anesthesia    hypotension, elevated heart rate and oxygenation desaturation  . Depression with anxiety   . Depression with anxiety   . Diverticulosis   . Dyspnea    due to myasthenia gravis  . Essential hypertension   . GERD (gastroesophageal reflux disease)   . History of pneumonia   . Hypothyroidism   . IBS (irritable colon syndrome)   . LVH (left ventricular hypertrophy)   . Migraines   . Mixed hyperlipidemia   . Obesity   . Ocular myasthenia gravis ([place])   . Osteoarthritis of knee   . PAF (paroxysmal atrial fibrillation) ([facility])    chads2vasc score is at least 4  . Paraganglioma ([place])    Resection  02/2012 (retroperitoneal)  . Paraganglioma, malignant ([place]) 11/06/2015  . Presence of permanent cardiac pacemaker 07/18/2017  . Renal insufficiency   . Skin cancer     Past Surgical History:  Procedure Laterality Date  . ABDOMINAL HYSTERECTOMY    . ABDOMINAL HYSTERECTOMY    . CARDIAC CATHETERIZATION    . CHOLECYSTECTOMY    . COLONOSCOPY  08/24/2011   Procedure: COLONOSCOPY;  Surgeon: Rogene Houston, MD;  Location: [facility];  Service: Endoscopy;  Laterality: N/A;  1200  . CT guided biopsy  01/16/12  . GIVENS CAPSULE STUDY  01/29/2012   Procedure: GIVENS CAPSULE STUDY;  Surgeon: Rogene Houston, MD;  Location: [facility];  Service: Endoscopy;  Laterality: N/A;  730  . KNEE SURGERY    . LAPAROTOMY  03/01/2012   Procedure: EXPLORATORY LAPAROTOMY;  Surgeon: Earnstine Regal, MD;  Location: [facility];  Service: General;  Laterality: N/A;  Exploratory Laparotomy ,Resection Retroperitoneal Mass  . MASTECTOMY, PARTIAL Right 10/28/2019   Procedure: RIGHT BREAST PARTIAL MASTECTOMY;  Surgeon: Armandina Gemma, MD;  Location: [facility];  Service: General;  Laterality: Right;  . NASAL SINUS SURGERY     30 yrs ago  . PACEMAKER IMPLANT N/A 07/18/2017   Procedure: PACEMAKER IMPLANT;  Surgeon: Evans Lance, MD;  Location: [facility]  INVASIVE CV LAB;  Service: Cardiovascular;  Laterality: N/A;  . RIGHT/LEFT HEART CATH AND CORONARY ANGIOGRAPHY N/A 11/18/2018   Procedure: RIGHT/LEFT HEART CATH AND CORONARY ANGIOGRAPHY;  Surgeon: Belva Crome, MD;  Location: [facility] CV LAB;  Service: Cardiovascular;  Laterality: N/A;  . TOTAL KNEE ARTHROPLASTY Bilateral    2005 and 2011    Family History  Problem Relation Age of Onset  . Inflammatory bowel disease Maternal Grandmother     Social history:  reports that she has never smoked. She has never used smokeless tobacco. She reports that she does not drink alcohol and does not use drugs.    Allergies  Allergen Reactions  . Nitrofurantoin  Macrocrystal Nausea And Vomiting  . Ampicillin Rash    Did it involve swelling of the face/tongue/throat, SOB, or low BP? No Did it involve sudden or severe rash/hives, skin peeling, or any reaction on the inside of your mouth or nose? No Did you need to seek medical attention at a hospital or doctor's office? No When did it last happen?20 years ago If all above answers are "NO", may proceed with cephalosporin use.   Marland Kitchen Avocado Diarrhea and Nausea And Vomiting  . Biaxin [Clarithromycin] Rash  . Doxycycline Monohydrate Nausea And Vomiting  . Moxifloxacin     Contraindication myasthenia gravis  . Tetracyclines & Related Nausea And Vomiting    Medications:  Prior to Admission medications   Medication Sig Start Date End Date Taking? Authorizing Provider  ALPRAZolam Duanne Moron) 0.5 MG tablet Take 1 mg by mouth 2 (two) times daily. 08/04/19  Yes [provider]  anastrozole (ARIMIDEX) 1 MG tablet Take 1 tablet (1 mg total) by mouth daily. 11/04/19  Yes Nicholas Lose, MD  apixaban (ELIQUIS) 5 MG TABS tablet Take 1 tablet (5 mg total) by mouth 2 (two) times daily. 10/21/19  Yes Evans Lance, MD  Artificial Tear Solution (SOOTHE XP OP) Place 1 drop into both eyes 4 (four) times daily.   Yes [provider]  atenolol (TENORMIN) 25 MG tablet TAKE (1) TABLET BY MOUTH ONCE DAILY. MAY TAKE AN ADDITIONAL TABLET IF SYSTOLIC OVER 696 OR SEVERE PALPITATIONS. 05/30/19  Yes Herminio Commons, MD  atorvastatin (LIPITOR) 80 MG tablet Take 80 mg by mouth daily.   Yes [provider]  B Complex-C (B-COMPLEX WITH VITAMIN C) tablet Take 1 tablet by mouth daily with supper.    Yes [provider]  cholecalciferol (VITAMIN D) 1000 units tablet Take 1,000 Units by mouth 2 (two) times a day.    Yes [provider]  Coenzyme Q10 (COQ10) 100 MG CAPS Take 100 mg by mouth daily.    Yes [provider]  diclofenac sodium (VOLTAREN) 1 % GEL Apply 4 g topically 4 (four)  times daily as needed (for pain).  01/13/15  Yes [provider]  ketoconazole (NIZORAL) 2 % cream Apply 1 application topically 2 (two) times daily. applied under breasts 06/17/18  Yes [provider]  lansoprazole (PREVACID) 30 MG capsule TAKE (1) CAPSULE BY MOUTH TWICE DAILY. Patient taking differently: daily at 12 noon. TAKE (1) CAPSULE BY MOUTH TWICE DAILY. 09/23/19  Yes Minus Liberty, PA-C  levothyroxine (SYNTHROID) 112 MCG tablet Take 112 mcg by mouth daily before breakfast.   Yes [provider]  lidocaine (LIDODERM) 5 % Place 1-3 patches onto the skin daily as needed (for pain). Remove & Discard patch within 12 hours or as directed by MD for pain   Yes [provider]  mycophenolate (CELLCEPT) 500 MG tablet Take 1 tablet (500 mg total) by mouth 2 (two) times daily. 09/23/19  Yes Kathrynn Ducking, MD  oxyCODONE-acetaminophen (PERCOCET) 10-325 MG tablet Take 1 tablet by mouth every 6 (six) hours as needed (pain).  05/14/18  Yes [provider]  predniSONE (DELTASONE) 5 MG tablet Take 2 tablets (10 mg total) by mouth daily with breakfast. 11/04/19  Yes Nicholas Lose, MD  pyridostigmine (MESTINON) 60 MG tablet TAKE (1/2) TABLET BY MOUTH FOUR TIMES DAILY. 06/17/19  Yes Kathrynn Ducking, MD  SUMAtriptan (IMITREX) 50 MG tablet Take 50 mg by mouth every 2 (two) hours as needed. For migraines   Yes [provider]  Terbinafine 1 % GEL Apply 1 application topically 2 (two) times daily as needed (toe nail fungus).    Yes [provider]  traZODone (DESYREL) 50 MG tablet TAKE (1) TABLET BY MOUTH AT BEDTIME. 10/30/19  Yes Kathrynn Ducking, MD  TURMERIC PO Take 1,000 mg by mouth daily with supper. Take twice a day   Yes [provider]    ROS:  Out of a complete 14 system review of symptoms, the patient complains only of the following symptoms, and all other reviewed systems are negative.  Fatigue, shortness of breath Joint  pain Walking difficulty  Blood pressure 128/70, pulse 64, height 5\' 4"  (1.626 m), weight 187 lb (84.8 kg).  Physical Exam  General: The patient is alert and cooperative at the time of the examination.  Skin: No significant peripheral edema is noted.   Neurologic Exam  Mental status: The patient is alert and oriented x 3 at the time of the examination. The patient has apparent normal recent and remote memory, with an apparently normal attention span and concentration ability.   Cranial nerves: Facial symmetry is present. Speech is normal, no aphasia or dysarthria is noted. Extraocular movements are full.  The patient appears to have slight medial deviation of the left eye with primary gaze.  Visual fields are full.  Motor: The patient has good strength in all 4 extremities.  Sensory examination: Soft touch sensation is symmetric on the face, arms, and legs.  Coordination: The patient has good finger-nose-finger and heel-to-shin bilaterally.  Gait and station: The patient has a slightly wide-based gait, the patient walk independently.  Romberg is negative.  No drift is seen.  Reflexes: Deep tendon reflexes are symmetric.   Assessment/Plan:  1.  Myasthenia gravis primarily with ocular features  2.  Gait disorder  3.  Recent diagnosis of breast cancer  The patient will be maintained on 10 mg of prednisone at this time, lower doses resulted in recurrence of symptoms.  The patient will have blood work done today, she will continue her Mestinon and CellCept at the current dose.  She will follow-up here in 6 months.  Jill Alexanders MD 12/01/2019 11:52 AM  [facility] [address] [place], [place] [address]  Phone [phone] Fax [phone]

## 2019-12-02 LAB — CBC WITH DIFFERENTIAL/PLATELET
Basophils Absolute: 0 10*3/uL (ref 0.0–0.2)
Basos: 0 %
EOS (ABSOLUTE): 0.1 10*3/uL (ref 0.0–0.4)
Eos: 1 %
Hematocrit: 41.7 % (ref 34.0–46.6)
Hemoglobin: 13.9 g/dL (ref 11.1–15.9)
Immature Grans (Abs): 0.1 10*3/uL (ref 0.0–0.1)
Immature Granulocytes: 1 %
Lymphocytes Absolute: 1.3 10*3/uL (ref 0.7–3.1)
Lymphs: 10 %
MCH: 32.9 pg (ref 26.6–33.0)
MCHC: 33.3 g/dL (ref 31.5–35.7)
MCV: 99 fL — ABNORMAL HIGH (ref 79–97)
Monocytes Absolute: 0.7 10*3/uL (ref 0.1–0.9)
Monocytes: 6 %
Neutrophils Absolute: 10.2 10*3/uL — ABNORMAL HIGH (ref 1.4–7.0)
Neutrophils: 82 %
Platelets: 157 10*3/uL (ref 150–450)
RBC: 4.23 x10E6/uL (ref 3.77–5.28)
RDW: 12.5 % (ref 11.7–15.4)
WBC: 12.4 10*3/uL — ABNORMAL HIGH (ref 3.4–10.8)

## 2019-12-02 LAB — COMPREHENSIVE METABOLIC PANEL
ALT: 86 IU/L — ABNORMAL HIGH (ref 0–32)
AST: 61 IU/L — ABNORMAL HIGH (ref 0–40)
Albumin/Globulin Ratio: 1.8 (ref 1.2–2.2)
Albumin: 4 g/dL (ref 3.6–4.6)
Alkaline Phosphatase: 81 IU/L (ref 44–121)
BUN/Creatinine Ratio: 14 (ref 12–28)
BUN: 15 mg/dL (ref 8–27)
Bilirubin Total: 0.4 mg/dL (ref 0.0–1.2)
CO2: 23 mmol/L (ref 20–29)
Calcium: 9.6 mg/dL (ref 8.7–10.3)
Chloride: 105 mmol/L (ref 96–106)
Creatinine, Ser: 1.06 mg/dL — ABNORMAL HIGH (ref 0.57–1.00)
GFR calc Af Amer: 57 mL/min/{1.73_m2} — ABNORMAL LOW (ref >59)
GFR calc non Af Amer: 49 mL/min/{1.73_m2} — ABNORMAL LOW (ref >59)
Globulin, Total: 2.2 g/dL (ref 1.5–4.5)
Glucose: 100 mg/dL — ABNORMAL HIGH (ref 65–99)
Potassium: 4.3 mmol/L (ref 3.5–5.2)
Sodium: 142 mmol/L (ref 134–144)
Total Protein: 6.2 g/dL (ref 6.0–8.5)

## 2019-12-04 ENCOUNTER — Ambulatory Visit (INDEPENDENT_AMBULATORY_CARE_PROVIDER_SITE_OTHER): Payer: Medicare Other

## 2019-12-04 ENCOUNTER — Other Ambulatory Visit: Payer: Self-pay

## 2019-12-04 ENCOUNTER — Encounter: Payer: Self-pay | Admitting: Specialist

## 2019-12-04 ENCOUNTER — Ambulatory Visit (INDEPENDENT_AMBULATORY_CARE_PROVIDER_SITE_OTHER): Payer: Medicare Other | Admitting: Specialist

## 2019-12-04 VITALS — BP 135/83 | HR 64 | Ht 64.0 in | Wt 187.0 lb

## 2019-12-04 DIAGNOSIS — M4156 Other secondary scoliosis, lumbar region: Secondary | ICD-10-CM | POA: Diagnosis not present

## 2019-12-04 DIAGNOSIS — R29898 Other symptoms and signs involving the musculoskeletal system: Secondary | ICD-10-CM

## 2019-12-04 DIAGNOSIS — G7000 Myasthenia gravis without (acute) exacerbation: Secondary | ICD-10-CM | POA: Diagnosis not present

## 2019-12-04 DIAGNOSIS — M4316 Spondylolisthesis, lumbar region: Secondary | ICD-10-CM

## 2019-12-04 DIAGNOSIS — M25551 Pain in right hip: Secondary | ICD-10-CM

## 2019-12-04 DIAGNOSIS — N189 Chronic kidney disease, unspecified: Secondary | ICD-10-CM | POA: Diagnosis not present

## 2019-12-04 DIAGNOSIS — M545 Low back pain, unspecified: Secondary | ICD-10-CM

## 2019-12-04 DIAGNOSIS — Z79891 Long term (current) use of opiate analgesic: Secondary | ICD-10-CM | POA: Diagnosis not present

## 2019-12-04 DIAGNOSIS — M4807 Spinal stenosis, lumbosacral region: Secondary | ICD-10-CM

## 2019-12-04 DIAGNOSIS — M5416 Radiculopathy, lumbar region: Secondary | ICD-10-CM

## 2019-12-04 DIAGNOSIS — G894 Chronic pain syndrome: Secondary | ICD-10-CM | POA: Diagnosis not present

## 2019-12-04 DIAGNOSIS — M25559 Pain in unspecified hip: Secondary | ICD-10-CM

## 2019-12-04 DIAGNOSIS — I482 Chronic atrial fibrillation, unspecified: Secondary | ICD-10-CM | POA: Diagnosis not present

## 2019-12-04 DIAGNOSIS — Z950 Presence of cardiac pacemaker: Secondary | ICD-10-CM | POA: Diagnosis not present

## 2019-12-04 DIAGNOSIS — K219 Gastro-esophageal reflux disease without esophagitis: Secondary | ICD-10-CM | POA: Diagnosis not present

## 2019-12-04 DIAGNOSIS — E039 Hypothyroidism, unspecified: Secondary | ICD-10-CM | POA: Diagnosis not present

## 2019-12-04 DIAGNOSIS — G4700 Insomnia, unspecified: Secondary | ICD-10-CM | POA: Diagnosis not present

## 2019-12-04 DIAGNOSIS — F331 Major depressive disorder, recurrent, moderate: Secondary | ICD-10-CM | POA: Diagnosis not present

## 2019-12-04 DIAGNOSIS — I129 Hypertensive chronic kidney disease with stage 1 through stage 4 chronic kidney disease, or unspecified chronic kidney disease: Secondary | ICD-10-CM | POA: Diagnosis not present

## 2019-12-04 DIAGNOSIS — E782 Mixed hyperlipidemia: Secondary | ICD-10-CM | POA: Diagnosis not present

## 2019-12-04 MED ORDER — GABAPENTIN 300 MG PO CAPS: 300.0000 mg | ORAL_CAPSULE | Freq: Every day | ORAL | 3 refills | Status: DC

## 2019-12-04 NOTE — Progress Notes (Signed)
Office Visit Note   Patient: [name]           Date of Birth: 06/23/1940           MRN: [id number] Visit Date: 12/04/2019              Requested by: [name], MD [place],   [id number] PCP: [name], MD   Assessment & Plan: Visit Diagnoses:  1. Low back pain, unspecified back pain laterality, unspecified chronicity, unspecified whether sciatica present   2. Hip pain   3. Spinal stenosis of lumbosacral region   4. Spondylolisthesis of lumbar region   5. Lumbar radiculopathy   6. Right leg weakness   7. Other secondary scoliosis, lumbar region     Plan: Avoid bending, stooping and avoid lifting weights greater than 10 lbs. Avoid prolong standing and walking. Avoid frequent bending and stooping  No lifting greater than 10 lbs. May use ice or moist heat for pain. Weight loss is of benefit. Handicap license is approved MRI of the lumbar spine with and without contrast due to history of recent breast cancer history of a malignant Paraganglioma peri aorta treated with resection by Dr. Harlow Asa in the past.  Call Dr. Jannifer Franklin and ask if okay to take the gabapentin. Follow-Up Instructions: Return in about 4 weeks (around 01/01/2020).   Orders:  Orders Placed This Encounter  Procedures  . XR HIP UNILAT W OR W/O PELVIS 2-3 VIEWS RIGHT  . XR Lumbar Spine 2-3 Views   No orders of the defined types were placed in this encounter.     Procedures: No procedures performed   Clinical Data: No additional findings.   Subjective: Chief Complaint  Patient presents with  . Lower Back - Pain  . Right Hip - Pain    79 year old female with history of myastenia gravis, post bilateral knee arthroplasties. History of lumbar spinal stenosis. She has had recent onset of pain into the right buttock radiating along the right anterior hip and then anterior right thigh to the right anterior knee and along the anterior right shin. She has pain some into the  medial right thigh and knee. History of osteoporosis. She has been seen by Dr. Maureen Ralphs and he feels than the knee replacement is not the source. She has had limitation in standing and walking, worse the last one month and she is having to lift the right thigh with both hands to get in and out of car and bed, just the last 3 weeks. The pain is there sitting or standing. Previous ruptured disc in the past, saw Dr. Joya Salm, saw Dr. Mayer Camel and told surgery may or may not relieve the pain.She has not had MRI in the past. Sees Dr. Alvia Grove and he is now retired.    Review of Systems  Constitutional: Negative.   HENT: Negative.   Eyes: Negative.   Respiratory: Negative.   Cardiovascular: Negative.   Gastrointestinal: Negative.   Endocrine: Negative.   Genitourinary: Negative.   Musculoskeletal: Positive for back pain and gait problem.  Skin: Negative.   Allergic/Immunologic: Negative.   Neurological: Positive for weakness and numbness.  Hematological: Negative.   Psychiatric/Behavioral: Negative.      Objective: Vital Signs: BP 135/83 (BP Location: Left Arm, Patient Position: Sitting)   Pulse 64   Ht 5\' 4"  (1.626 m)   Wt 187 lb (84.8 kg)   BMI 32.10 kg/m   Physical Exam Constitutional:  Appearance: She is well-developed.  HENT:     Head: Normocephalic and atraumatic.  Eyes:     Pupils: Pupils are equal, round, and reactive to light.  Pulmonary:     Effort: Pulmonary effort is normal.     Breath sounds: Normal breath sounds.  Abdominal:     General: Bowel sounds are normal.     Palpations: Abdomen is soft.  Musculoskeletal:     Cervical back: Normal range of motion and neck supple.  Skin:    General: Skin is warm and dry.  Neurological:     Mental Status: She is alert and oriented to person, place, and time.  Psychiatric:        Behavior: Behavior normal.        Thought Content: Thought content normal.        Judgment: Judgment normal.     Back Exam   Tenderness  The  patient is experiencing tenderness in the lumbar.  Range of Motion  Extension: abnormal  Flexion: abnormal  Lateral bend right: abnormal  Lateral bend left: abnormal  Rotation right: abnormal  Rotation left: abnormal   Muscle Strength  Right Quadriceps:  4/5  Left Quadriceps:  5/5  Right Hamstrings:  5/5  Left Hamstrings:  5/5   Reflexes  Patellar: 0/4 Achilles: 0/4 Biceps: 0/4 Babinski's sign: normal   Other  Heel walk: normal Gait: Trendelenburg  Erythema: no back redness Scars: absent  Comments:  Weak right hip flexor, weak right knee extension      Specialty Comments:  No specialty comments available.  Imaging: XR HIP UNILAT W OR W/O PELVIS 2-3 VIEWS RIGHT  Result Date: 12/04/2019 Ap and lateral radiographs right hip with moderated DJD with joint line narrowing and superolateral and inferomedial no acute changes. Bilateral SI joint sclerosis is present with a degenerative scoliosis of the lumbar spine with DDD in the lower segment visualized. There is left lateral listhesis L4-5.   XR Lumbar Spine 2-3 Views  Result Date: 12/04/2019 AP and lateral flexion and extension radiographs show a left lumbar scoliosis T12 to L5 of 16 degrees with lateral listhesis to the left L4-5 and bilateral SI sclerosis.    PMFS History: Patient Active Problem List   Diagnosis Date Noted  . Malignant neoplasm of upper-outer quadrant of right breast in female, estrogen receptor positive ([place]) 10/22/2019  . Atypical chest pain 09/23/2018  . Dilated bile duct 09/23/2018  . Sinus node dysfunction ([place]) 07/18/2017  . Dyspnea on exertion 06/25/2017  . Paraganglioma, malignant ([place]) 11/06/2015  . Hypertensive cardiovascular disease 05/25/2014  . Malnutrition of moderate degree ([place]) 04/28/2014  . Thrombocytopenia ([place]) 04/28/2014  . UTI (urinary tract infection) 04/28/2014  . Physical deconditioning 04/27/2014  . Ocular myasthenia ([place]) 04/27/2014  . Depression with anxiety  04/27/2014  . Chronic diastolic CHF (congestive heart failure) ([place]) 04/27/2014  . Bradycardia   . Orthostatic hypotension   . AKI (acute kidney injury) ([place]) 04/05/2014  . Palpitations 04/04/2014  . Paroxysmal atrial fibrillation ([place]) 04/04/2014  . Chest pain 04/01/2014  . Sinus bradycardia 04/01/2014  . Weight loss 06/10/2012  . Abdominal pain, bilateral lower quadrant 09/26/2011  . Hypertension 07/26/2011  . Hypothyroidism 07/26/2011  . Arthritis 07/26/2011  . High cholesterol 07/26/2011  . GERD (gastroesophageal reflux disease) 07/26/2011  . Rectal bleeding 07/26/2011  . IBS (irritable bowel syndrome) 07/26/2011   Past Medical History:  Diagnosis Date  . Anemia   . Breast cancer ([place]) 09/2019   right breast   .  Chronic back pain   . Chronic nausea   . Complication of anesthesia    hypotension, elevated heart rate and oxygenation desaturation  . Depression with anxiety   . Depression with anxiety   . Diverticulosis   . Dyspnea    due to myasthenia gravis  . Essential hypertension   . GERD (gastroesophageal reflux disease)   . History of pneumonia   . Hypothyroidism   . IBS (irritable colon syndrome)   . LVH (left ventricular hypertrophy)   . Migraines   . Mixed hyperlipidemia   . Obesity   . Ocular myasthenia gravis ([place])   . Osteoarthritis of knee   . PAF (paroxysmal atrial fibrillation) ([facility])    chads2vasc score is at least 4  . Paraganglioma ([place])    Resection 02/2012 (retroperitoneal)  . Paraganglioma, malignant ([place]) 11/06/2015  . Presence of permanent cardiac pacemaker 07/18/2017  . Renal insufficiency   . Skin cancer     Family History  Problem Relation Age of Onset  . Inflammatory bowel disease Maternal Grandmother     Past Surgical History:  Procedure Laterality Date  . ABDOMINAL HYSTERECTOMY    . ABDOMINAL HYSTERECTOMY    . CARDIAC CATHETERIZATION    . CHOLECYSTECTOMY    . COLONOSCOPY  08/24/2011   Procedure: COLONOSCOPY;  Surgeon: Rogene Houston, MD;  [facility];  Service: Endoscopy;  Laterality: N/A;  1200  . CT guided biopsy  01/16/12  . GIVENS CAPSULE STUDY  01/29/2012   Procedure: GIVENS CAPSULE STUDY;  Surgeon: Rogene Houston, MD;  [facility];  Service: Endoscopy;  Laterality: N/A;  730  . KNEE SURGERY    . LAPAROTOMY  03/01/2012   Procedure: EXPLORATORY LAPAROTOMY;  Surgeon: Earnstine Regal, MD;  [facility];  Service: General;  Laterality: N/A;  Exploratory Laparotomy ,Resection Retroperitoneal Mass  . MASTECTOMY, PARTIAL Right 10/28/2019   Procedure: RIGHT BREAST PARTIAL MASTECTOMY;  Surgeon: Armandina Gemma, MD;  Location: [facility];  Service: General;  Laterality: Right;  . NASAL SINUS SURGERY     30 yrs ago  . PACEMAKER IMPLANT N/A 07/18/2017   Procedure: PACEMAKER IMPLANT;  Surgeon: Evans Lance, MD;  Location: [facility];  Service: Cardiovascular;  Laterality: N/A;  . RIGHT/LEFT HEART CATH AND CORONARY ANGIOGRAPHY N/A 11/18/2018   Procedure: RIGHT/LEFT HEART CATH AND CORONARY ANGIOGRAPHY;  Surgeon: Belva Crome, MD;  Location: [facility];  Service: Cardiovascular;  Laterality: N/A;  . TOTAL KNEE ARTHROPLASTY Bilateral    2005 and 2011   Social History   Occupational History  . Occupation: Retired Quarry manager  Tobacco Use  . Smoking status: Never Smoker  . Smokeless tobacco: Never Used  Vaping Use  . Vaping Use: Never used  Substance and Sexual Activity  . Alcohol use: No    Alcohol/week: 0.0 standard drinks  . Drug use: No  . Sexual activity: Not on file

## 2019-12-04 NOTE — Patient Instructions (Addendum)
Avoid bending, stooping and avoid lifting weights greater than 10 lbs. Avoid prolong standing and walking. Avoid frequent bending and stooping  No lifting greater than 10 lbs. May use ice or moist heat for pain. Weight loss is of benefit. Handicap license is approved MRI of the lumbar spine with and without contrast due to history of recent breast cancer history of a malignant Paraganglioma peri aorta treated with resection by Dr. Harlow Asa in the past.

## 2019-12-09 ENCOUNTER — Telehealth: Payer: Self-pay | Admitting: Neurology

## 2019-12-09 NOTE — Telephone Encounter (Signed)
Pt called Dr. Basil Dess prescribed gabapentin. Dr. Louanne Skye suggested to check with Dr. Jannifer Franklin before taking gabapentin to make sure it's ok. Will not take gabapentin until I hear from Dr. Jannifer Franklin. Would like a call from the nurse as soon as possible.

## 2019-12-09 NOTE — Telephone Encounter (Signed)
I called the pt back. She is aware Dr Jannifer Franklin is out of the office currently. Pt would like if a work-in physician can review and see if ok to start Gabapentin. She stated Dr Louanne Skye wanted her to check with neurology before starting Gabapentin.

## 2019-12-09 NOTE — Telephone Encounter (Signed)
Gabapentin is not commonly associated with exacerbating myasthenia gravis. I think she can take it, appears it is a low dose at bedtime so should be fine. but if she has any worsening of symptoms of course stop it. thanks

## 2019-12-10 ENCOUNTER — Encounter: Payer: Self-pay | Admitting: Emergency Medicine

## 2019-12-10 NOTE — Telephone Encounter (Signed)
Reached patient by phone, educated patient on use of Gabapentin as advised by Dr. Jaynee Eagles.All questions answered, patient expressed appreciation.

## 2019-12-10 NOTE — Telephone Encounter (Signed)
Noted  

## 2019-12-10 NOTE — Telephone Encounter (Signed)
error 

## 2019-12-10 NOTE — Telephone Encounter (Signed)
Attempted to reach patient, no voice mail option.  Will try again later.

## 2019-12-22 ENCOUNTER — Other Ambulatory Visit: Payer: Self-pay

## 2019-12-22 ENCOUNTER — Ambulatory Visit (HOSPITAL_COMMUNITY)
Admission: RE | Admit: 2019-12-22 | Discharge: 2019-12-22 | Disposition: A | Discharge status: Home / Self Care | Payer: Medicare Other | Source: Ambulatory Visit | Attending: Specialist | Admitting: Specialist

## 2019-12-22 DIAGNOSIS — M4316 Spondylolisthesis, lumbar region: Secondary | ICD-10-CM | POA: Insufficient documentation

## 2019-12-22 DIAGNOSIS — M4807 Spinal stenosis, lumbosacral region: Secondary | ICD-10-CM | POA: Insufficient documentation

## 2019-12-22 DIAGNOSIS — M545 Low back pain, unspecified: Secondary | ICD-10-CM | POA: Diagnosis not present

## 2019-12-22 MED ORDER — GADOBUTROL 1 MMOL/ML IV SOLN
8.0000 mL | Freq: Once | INTRAVENOUS | Status: AC | PRN
  Administered 2019-12-22: 8 mL via INTRAVENOUS

## 2019-12-22 NOTE — Progress Notes (Signed)
Patient here today for MRI with pacemaker. [facility] sent. Orders received for DOO rate of 90

## 2019-12-22 NOTE — Progress Notes (Signed)
Patient re-programmed to pre MRI settings. [facility] sent. Ambulatory at d/c

## 2019-12-22 NOTE — Progress Notes (Signed)
Informed of MRI for today.   Device system confirmed to be MRI conditional, with implant date > 6 weeks ago and no evidence of abandoned or epicardial leads in review of most recent CXR Interrogation from today reviewed, pt is currently AP-VS at 68 bpm Change device settings for MRI to DOO at 90 bpm  Tachy-therapies to off if applicable  Program device back to pre-MRI settings after completion of exam.  Shirley Friar, PA-C  12/22/2019 1:12 PM

## 2020-01-12 ENCOUNTER — Ambulatory Visit (INDEPENDENT_AMBULATORY_CARE_PROVIDER_SITE_OTHER): Payer: Medicare Other | Admitting: Specialist

## 2020-01-12 ENCOUNTER — Ambulatory Visit (INDEPENDENT_AMBULATORY_CARE_PROVIDER_SITE_OTHER): Payer: Medicare Other

## 2020-01-12 ENCOUNTER — Encounter: Payer: Self-pay | Admitting: Specialist

## 2020-01-12 ENCOUNTER — Other Ambulatory Visit: Payer: Self-pay

## 2020-01-12 VITALS — BP 132/76 | HR 73 | Ht 64.0 in | Wt 187.0 lb

## 2020-01-12 DIAGNOSIS — G7000 Myasthenia gravis without (acute) exacerbation: Secondary | ICD-10-CM | POA: Diagnosis not present

## 2020-01-12 DIAGNOSIS — M4807 Spinal stenosis, lumbosacral region: Secondary | ICD-10-CM

## 2020-01-12 DIAGNOSIS — I482 Chronic atrial fibrillation, unspecified: Secondary | ICD-10-CM | POA: Diagnosis not present

## 2020-01-12 DIAGNOSIS — M25511 Pain in right shoulder: Secondary | ICD-10-CM

## 2020-01-12 DIAGNOSIS — I4891 Unspecified atrial fibrillation: Secondary | ICD-10-CM | POA: Diagnosis not present

## 2020-01-12 DIAGNOSIS — M4316 Spondylolisthesis, lumbar region: Secondary | ICD-10-CM

## 2020-01-12 DIAGNOSIS — E782 Mixed hyperlipidemia: Secondary | ICD-10-CM | POA: Diagnosis not present

## 2020-01-12 DIAGNOSIS — G4700 Insomnia, unspecified: Secondary | ICD-10-CM | POA: Diagnosis not present

## 2020-01-12 DIAGNOSIS — F331 Major depressive disorder, recurrent, moderate: Secondary | ICD-10-CM | POA: Diagnosis not present

## 2020-01-12 DIAGNOSIS — G894 Chronic pain syndrome: Secondary | ICD-10-CM | POA: Diagnosis not present

## 2020-01-12 DIAGNOSIS — M19211 Secondary osteoarthritis, right shoulder: Secondary | ICD-10-CM

## 2020-01-12 DIAGNOSIS — N644 Mastodynia: Secondary | ICD-10-CM | POA: Diagnosis not present

## 2020-01-12 DIAGNOSIS — M1611 Unilateral primary osteoarthritis, right hip: Secondary | ICD-10-CM

## 2020-01-12 DIAGNOSIS — I10 Essential (primary) hypertension: Secondary | ICD-10-CM | POA: Diagnosis not present

## 2020-01-12 DIAGNOSIS — R29898 Other symptoms and signs involving the musculoskeletal system: Secondary | ICD-10-CM | POA: Diagnosis not present

## 2020-01-12 DIAGNOSIS — E039 Hypothyroidism, unspecified: Secondary | ICD-10-CM | POA: Diagnosis not present

## 2020-01-12 DIAGNOSIS — R0602 Shortness of breath: Secondary | ICD-10-CM | POA: Diagnosis not present

## 2020-01-12 MED ORDER — GABAPENTIN 300 MG PO CAPS: 300.0000 mg | ORAL_CAPSULE | Freq: Two times a day (BID) | ORAL | 3 refills | Status: DC

## 2020-01-12 NOTE — Patient Instructions (Addendum)
Avoid overhead lifting and overhead use of the arms. Pillows to keep from sleeping directly on the shoulders Limited lifting to less than 10 lbs. Ice or heat for relief. You  Should not take NSAIDs are helpful, such as alleve or motrin,due to history of CHF and afib. .  Stretching exercise help and strengthening is helpful to build endurance. Use a cane on the left side for the right hip pain. Schedule for right hip injection for arthritis.  In the future may consider injection with steroid for the lumbar spinal stenosis.  Avoid bending, stooping and avoid lifting weights greater than 10 lbs. Avoid prolong standing and walking. Avoid frequent bending and stooping  No lifting greater than 10 lbs. May use ice or moist heat for pain. Weight loss is of benefit. Handicap license is approved. Dr. Romona Curls secretary/Assistant will call to arrange for right hip intraarticular injection for arthritis.

## 2020-01-12 NOTE — Progress Notes (Signed)
Office Visit Note   Patient: [name]           Date of Birth: May 25, 1940           MRN: [id number] Visit Date: 01/12/2020              Requested by: [name], MD [place],   [id number] PCP: [name], MD   Assessment & Plan: Visit Diagnoses:  1. Right shoulder pain, unspecified chronicity   2. Spinal stenosis of lumbosacral region   3. Spondylolisthesis of lumbar region   4. Right leg weakness   5. Secondary osteoarthritis of right shoulder due to rotator cuff arthropathy   6. Unilateral primary osteoarthritis, right hip     Plan:Avoid overhead lifting and overhead use of the arms. Pillows to keep from sleeping directly on the shoulders Limited lifting to less than 10 lbs. Ice or heat for relief. You  Should not take NSAIDs are helpful, such as alleve or motrin,due to history of CHF and afib. .  Stretching exercise help and strengthening is helpful to build endurance. Use a cane on the left side for the right hip pain. Schedule for right hip injection for arthritis.  In the future may consider injection with steroid for the lumbar spinal stenosis.  Avoid bending, stooping and avoid lifting weights greater than 10 lbs. Avoid prolong standing and walking. Avoid frequent bending and stooping  No lifting greater than 10 lbs. May use ice or moist heat for pain. Weight loss is of benefit. Handicap license is approved. Dr. Romona Curls secretary/Assistant will call to arrange for right hip intraarticular injection for arthritis.  Follow-Up Instructions: Return in about 4 weeks (around 02/09/2020).   Orders:  Orders Placed This Encounter  Procedures  . XR Shoulder Right   No orders of the defined types were placed in this encounter.     Procedures: No procedures performed   Clinical Data: No additional findings.   Subjective: Chief Complaint  Patient presents with  . Lower Back - Follow-up    HPI  Review of Systems   Cardiovascular: Positive for leg swelling.  Musculoskeletal: Positive for back pain, gait problem and joint swelling.     Objective: Vital Signs: BP 132/76 (BP Location: Right Arm, Patient Position: Sitting)   Pulse 73   Ht 5\' 4"  (1.626 m)   Wt 187 lb (84.8 kg)   BMI 32.10 kg/m   Physical Exam  Ortho Exam  Specialty Comments:  No specialty comments available.  Imaging: XR Shoulder Right  Result Date: 01/12/2020 Right shoulder 3 way view with superior migration of the right humeral head with finding suggesting an absence of the right rotator cuff with DJD the right distal clavicle. Axillary lateral with anterior narrowign. Findings suggest cuff arthropathy that is moderat    PMFS History: Patient Active Problem List   Diagnosis Date Noted  . Malignant neoplasm of upper-outer quadrant of right breast in female, estrogen receptor positive ([place]) 10/22/2019  . Atypical chest pain 09/23/2018  . Dilated bile duct 09/23/2018  . Sinus node dysfunction ([place]) 07/18/2017  . Dyspnea on exertion 06/25/2017  . Paraganglioma, malignant ([place]) 11/06/2015  . Hypertensive cardiovascular disease 05/25/2014  . Malnutrition of moderate degree ([place]) 04/28/2014  . Thrombocytopenia ([place]) 04/28/2014  . UTI (urinary tract infection) 04/28/2014  . Physical deconditioning 04/27/2014  . Ocular myasthenia ([place]) 04/27/2014  . Depression with anxiety 04/27/2014  . Chronic diastolic CHF (congestive heart failure) ([place]) 04/27/2014  .  Bradycardia   . Orthostatic hypotension   . AKI (acute kidney injury) ([place]) 04/05/2014  . Palpitations 04/04/2014  . Paroxysmal atrial fibrillation ([place]) 04/04/2014  . Chest pain 04/01/2014  . Sinus bradycardia 04/01/2014  . Weight loss 06/10/2012  . Abdominal pain, bilateral lower quadrant 09/26/2011  . Hypertension 07/26/2011  . Hypothyroidism 07/26/2011  . Arthritis 07/26/2011  . High cholesterol 07/26/2011  . GERD (gastroesophageal reflux disease) 07/26/2011   . Rectal bleeding 07/26/2011  . IBS (irritable bowel syndrome) 07/26/2011   Past Medical History:  Diagnosis Date  . Anemia   . Breast cancer ([place]) 09/2019   right breast   . Chronic back pain   . Chronic nausea   . Complication of anesthesia    hypotension, elevated heart rate and oxygenation desaturation  . Depression with anxiety   . Depression with anxiety   . Diverticulosis   . Dyspnea    due to myasthenia gravis  . Essential hypertension   . GERD (gastroesophageal reflux disease)   . History of pneumonia   . Hypothyroidism   . IBS (irritable colon syndrome)   . LVH (left ventricular hypertrophy)   . Migraines   . Mixed hyperlipidemia   . Obesity   . Ocular myasthenia gravis ([place])   . Osteoarthritis of knee   . PAF (paroxysmal atrial fibrillation) ([facility])    chads2vasc score is at least 4  . Paraganglioma ([place])    Resection 02/2012 (retroperitoneal)  . Paraganglioma, malignant ([place]) 11/06/2015  . Presence of permanent cardiac pacemaker 07/18/2017  . Renal insufficiency   . Skin cancer     Family History  Problem Relation Age of Onset  . Inflammatory bowel disease Maternal Grandmother     Past Surgical History:  Procedure Laterality Date  . ABDOMINAL HYSTERECTOMY    . ABDOMINAL HYSTERECTOMY    . CARDIAC CATHETERIZATION    . CHOLECYSTECTOMY    . COLONOSCOPY  08/24/2011   Procedure: COLONOSCOPY;  Surgeon: Rogene Houston, MD;  [facility];  Service: Endoscopy;  Laterality: N/A;  1200  . CT guided biopsy  01/16/12  . GIVENS CAPSULE STUDY  01/29/2012   Procedure: GIVENS CAPSULE STUDY;  Surgeon: Rogene Houston, MD;  [facility];  Service: Endoscopy;  Laterality: N/A;  730  . KNEE SURGERY    . LAPAROTOMY  03/01/2012   Procedure: EXPLORATORY LAPAROTOMY;  Surgeon: Earnstine Regal, MD;  [facility];  Service: General;  Laterality: N/A;  Exploratory Laparotomy ,Resection Retroperitoneal Mass  . MASTECTOMY, PARTIAL Right 10/28/2019   Procedure:  RIGHT BREAST PARTIAL MASTECTOMY;  Surgeon: Armandina Gemma, MD;  Location: [facility];  Service: General;  Laterality: Right;  . NASAL SINUS SURGERY     30 yrs ago  . PACEMAKER IMPLANT N/A 07/18/2017   Procedure: PACEMAKER IMPLANT;  Surgeon: Evans Lance, MD;  Location: [facility];  Service: Cardiovascular;  Laterality: N/A;  . RIGHT/LEFT HEART CATH AND CORONARY ANGIOGRAPHY N/A 11/18/2018   Procedure: RIGHT/LEFT HEART CATH AND CORONARY ANGIOGRAPHY;  Surgeon: Belva Crome, MD;  Location: [facility];  Service: Cardiovascular;  Laterality: N/A;  . TOTAL KNEE ARTHROPLASTY Bilateral    2005 and 2011   Social History   Occupational History  . Occupation: Retired Quarry manager  Tobacco Use  . Smoking status: Never Smoker  . Smokeless tobacco: Never Used  Vaping Use  . Vaping Use: Never used  Substance and Sexual Activity  . Alcohol use: No  Alcohol/week: 0.0 standard drinks  . Drug use: No  . Sexual activity: Not on file

## 2020-01-19 DIAGNOSIS — I129 Hypertensive chronic kidney disease with stage 1 through stage 4 chronic kidney disease, or unspecified chronic kidney disease: Secondary | ICD-10-CM | POA: Diagnosis not present

## 2020-01-19 DIAGNOSIS — Z23 Encounter for immunization: Secondary | ICD-10-CM | POA: Diagnosis not present

## 2020-01-19 DIAGNOSIS — Z950 Presence of cardiac pacemaker: Secondary | ICD-10-CM | POA: Diagnosis not present

## 2020-01-19 DIAGNOSIS — K219 Gastro-esophageal reflux disease without esophagitis: Secondary | ICD-10-CM | POA: Diagnosis not present

## 2020-01-19 DIAGNOSIS — G7000 Myasthenia gravis without (acute) exacerbation: Secondary | ICD-10-CM | POA: Diagnosis not present

## 2020-01-19 DIAGNOSIS — I482 Chronic atrial fibrillation, unspecified: Secondary | ICD-10-CM | POA: Diagnosis not present

## 2020-01-19 DIAGNOSIS — F331 Major depressive disorder, recurrent, moderate: Secondary | ICD-10-CM | POA: Diagnosis not present

## 2020-01-19 DIAGNOSIS — Z79891 Long term (current) use of opiate analgesic: Secondary | ICD-10-CM | POA: Diagnosis not present

## 2020-01-19 DIAGNOSIS — G4700 Insomnia, unspecified: Secondary | ICD-10-CM | POA: Diagnosis not present

## 2020-01-19 DIAGNOSIS — E782 Mixed hyperlipidemia: Secondary | ICD-10-CM | POA: Diagnosis not present

## 2020-01-19 DIAGNOSIS — G894 Chronic pain syndrome: Secondary | ICD-10-CM | POA: Diagnosis not present

## 2020-01-19 DIAGNOSIS — E039 Hypothyroidism, unspecified: Secondary | ICD-10-CM | POA: Diagnosis not present

## 2020-01-20 ENCOUNTER — Ambulatory Visit (INDEPENDENT_AMBULATORY_CARE_PROVIDER_SITE_OTHER): Payer: Medicare Other | Admitting: Internal Medicine

## 2020-01-20 ENCOUNTER — Other Ambulatory Visit: Payer: Self-pay

## 2020-01-20 ENCOUNTER — Encounter (INDEPENDENT_AMBULATORY_CARE_PROVIDER_SITE_OTHER): Payer: Self-pay | Admitting: Internal Medicine

## 2020-01-20 VITALS — BP 120/74 | HR 72 | Temp 98.3°F | Ht 63.0 in | Wt 184.2 lb

## 2020-01-20 DIAGNOSIS — K219 Gastro-esophageal reflux disease without esophagitis: Secondary | ICD-10-CM

## 2020-01-20 DIAGNOSIS — K589 Irritable bowel syndrome without diarrhea: Secondary | ICD-10-CM | POA: Diagnosis not present

## 2020-01-20 NOTE — Progress Notes (Signed)
Presenting complaint;  Follow for chronic GERD.  History of IBS.  Database and subjective:  Patient is 79 year old Caucasian female who has chronic GERD history of IBS who is here for scheduled visit accompanied by her daughter [name].  She was last seen in September 2020.  She says she is doing well.  She has not had any more episodes of chest pain.  Heartburn is well controlled with therapy.  She wants to know if she can drop Prevacid dose.  She read that it can cause memory issues or dementia.  She is having anywhere from 2-4 bowel movements per day.  Stools are soft to formed.  She does have urgency and she has had 1 accident since her last visit.  She denies melena or rectal bleeding.  She complains of leg cramps despite taking magnesium.  She is using mustard as well.  She has occasional pain in her right lower quadrant but does not last long. She says she eats fruits and vegetables.  She does not eat much meat.  She has lost 7 pounds since last visit.  She is not trying to lose weight but she feels it is due to her eating habits.  Current Medications: Outpatient Encounter Medications as of 01/20/2020  Medication Sig  . ALPRAZolam (XANAX) 0.5 MG tablet Take 1 mg by mouth 2 (two) times daily.  Marland Kitchen anastrozole (ARIMIDEX) 1 MG tablet Take 1 tablet (1 mg total) by mouth daily.  Marland Kitchen apixaban (ELIQUIS) 5 MG TABS tablet Take 1 tablet (5 mg total) by mouth 2 (two) times daily.  . Artificial Tear Solution (SOOTHE XP OP) Place 1 drop into both eyes 4 (four) times daily.  Marland Kitchen atenolol (TENORMIN) 25 MG tablet TAKE (1) TABLET BY MOUTH ONCE DAILY. MAY TAKE AN ADDITIONAL TABLET IF SYSTOLIC OVER 220 OR SEVERE PALPITATIONS.  Marland Kitchen atorvastatin (LIPITOR) 80 MG tablet Take 80 mg by mouth daily.  . B Complex-C (B-COMPLEX WITH VITAMIN C) tablet Take 1 tablet by mouth daily with supper.   . cholecalciferol (VITAMIN D) 1000 units tablet Take 1,000 Units by mouth 2 (two) times a day.   . Coenzyme Q10 (COQ10) 100 MG CAPS  Take 100 mg by mouth daily.   . diclofenac sodium (VOLTAREN) 1 % GEL Apply 4 g topically 4 (four) times daily as needed (for pain).   Marland Kitchen gabapentin (NEURONTIN) 300 MG capsule Take 1 capsule (300 mg total) by mouth 2 (two) times daily.  Marland Kitchen ketoconazole (NIZORAL) 2 % cream Apply 1 application topically 2 (two) times daily. applied under breasts  . lansoprazole (PREVACID) 30 MG capsule TAKE (1) CAPSULE BY MOUTH TWICE DAILY. (Patient taking differently: daily at 12 noon. TAKE (1) CAPSULE BY MOUTH TWICE DAILY.)  . levothyroxine (SYNTHROID) 112 MCG tablet Take 112 mcg by mouth daily before breakfast.  . lidocaine (LIDODERM) 5 % Place 1-3 patches onto the skin daily as needed (for pain). Remove & Discard patch within 12 hours or as directed by MD for pain  . mycophenolate (CELLCEPT) 500 MG tablet Take 1 tablet (500 mg total) by mouth 2 (two) times daily.  Marland Kitchen oxyCODONE-acetaminophen (PERCOCET) 10-325 MG tablet Take 1 tablet by mouth every 6 (six) hours as needed (pain).   . predniSONE (DELTASONE) 5 MG tablet Take 2 tablets (10 mg total) by mouth daily with breakfast.  . pyridostigmine (MESTINON) 60 MG tablet Take 0.5 tablets (30 mg total) by mouth 4 (four) times daily.  . SUMAtriptan (IMITREX) 50 MG tablet Take 50 mg by mouth every  2 (two) hours as needed. For migraines  . Terbinafine 1 % GEL Apply 1 application topically 2 (two) times daily as needed (toe nail fungus).   . traZODone (DESYREL) 50 MG tablet TAKE (1) TABLET BY MOUTH AT BEDTIME.  Marland Kitchen TURMERIC PO Take 1,000 mg by mouth daily with supper. Take twice a day   Facility-Administered Encounter Medications as of 01/20/2020  Medication  . sodium chloride flush (NS) 0.9 % injection 3 mL    Objective: Blood pressure 120/74, pulse 72, temperature 98.3 F (36.8 C), temperature source Oral, height 5\' 3"  (1.6 m), weight 184 lb 3.2 oz (83.6 kg). Patient is alert and in no acute distress. She is wearing a mask. He uses cane to ambulate. Conjunctiva is  pink. Sclera is nonicteric Oropharyngeal mucosa is normal. No neck masses or thyromegaly noted. Cardiac exam with regular rhythm normal S1 and S2. No murmur or gallop noted. Lungs are clear to auscultation. Abdomen is full.  She has right subcostal and lower midline scars.  On palpation abdomen is soft and nontender with organomegaly or masses. No LE edema or clubbing noted.  Assessment:  #1.  Chronic GERD.  She has been on lansoprazole twice daily for quite some time.  She can drop the dose to once daily and if it works she can try every other day.  Prescription will be changed at the time it is felt  #2.  History of IBS.  She is having frequent stooling and occasional urgency and accident and I wonder if it is because she is on magnesium.  Plan:  Decrease lansoprazole to 30 mg daily and stay on it for 1 to 2 months and thereafter can drop it to every other day. Consider decreasing magnesium dose to half a pill.  If stool frequency persists may consider taking loperamide OTC daily as needed. Office visit in 1 year.

## 2020-01-20 NOTE — Patient Instructions (Addendum)
Can decrease Prevacid ose to once daily for 4 to 6 weeks and then skip every 3rd day.

## 2020-01-23 ENCOUNTER — Other Ambulatory Visit: Payer: Self-pay | Admitting: Neurology

## 2020-02-03 ENCOUNTER — Ambulatory Visit (INDEPENDENT_AMBULATORY_CARE_PROVIDER_SITE_OTHER): Payer: Medicare Other

## 2020-02-03 DIAGNOSIS — R001 Bradycardia, unspecified: Secondary | ICD-10-CM

## 2020-02-03 LAB — CUP PACEART REMOTE DEVICE CHECK
Battery Remaining Longevity: 123 mo
Battery Voltage: 3.01 V
Brady Statistic AP VP Percent: 0.04 %
Brady Statistic AP VS Percent: 97.94 %
Brady Statistic AS VP Percent: 0 %
Brady Statistic AS VS Percent: 2.03 %
Brady Statistic RA Percent Paced: 98.1 %
Brady Statistic RV Percent Paced: 0.04 %
Date Time Interrogation Session: 20211116003650
Implantable Lead Implant Date: 20190501
Implantable Lead Implant Date: 20190501
Implantable Lead Location: 753859
Implantable Lead Location: 753860
Implantable Lead Model: 3830
Implantable Lead Model: 5076
Implantable Pulse Generator Implant Date: 20190501
Lead Channel Impedance Value: 323 Ohm
Lead Channel Impedance Value: 361 Ohm
Lead Channel Impedance Value: 361 Ohm
Lead Channel Impedance Value: 475 Ohm
Lead Channel Pacing Threshold Amplitude: 0.625 V
Lead Channel Pacing Threshold Amplitude: 0.625 V
Lead Channel Pacing Threshold Pulse Width: 0.4 ms
Lead Channel Pacing Threshold Pulse Width: 0.4 ms
Lead Channel Sensing Intrinsic Amplitude: 1 mV
Lead Channel Sensing Intrinsic Amplitude: 1 mV
Lead Channel Sensing Intrinsic Amplitude: 10 mV
Lead Channel Sensing Intrinsic Amplitude: 10 mV
Lead Channel Setting Pacing Amplitude: 2 V
Lead Channel Setting Pacing Amplitude: 2.5 V
Lead Channel Setting Pacing Pulse Width: 0.4 ms
Lead Channel Setting Sensing Sensitivity: 0.9 mV

## 2020-02-05 NOTE — Progress Notes (Signed)
Remote pacemaker transmission.   

## 2020-02-06 ENCOUNTER — Inpatient Hospital Stay: Payer: Medicare Other | Attending: Hematology and Oncology | Admitting: Adult Health

## 2020-02-06 ENCOUNTER — Other Ambulatory Visit: Payer: Self-pay

## 2020-02-06 ENCOUNTER — Encounter: Payer: Self-pay | Admitting: Adult Health

## 2020-02-06 VITALS — BP 127/83 | HR 75 | Temp 97.8°F | Resp 18 | Ht 63.0 in | Wt 181.5 lb

## 2020-02-06 DIAGNOSIS — Z950 Presence of cardiac pacemaker: Secondary | ICD-10-CM | POA: Diagnosis not present

## 2020-02-06 DIAGNOSIS — E039 Hypothyroidism, unspecified: Secondary | ICD-10-CM | POA: Insufficient documentation

## 2020-02-06 DIAGNOSIS — I10 Essential (primary) hypertension: Secondary | ICD-10-CM | POA: Insufficient documentation

## 2020-02-06 DIAGNOSIS — Z7901 Long term (current) use of anticoagulants: Secondary | ICD-10-CM | POA: Insufficient documentation

## 2020-02-06 DIAGNOSIS — E2839 Other primary ovarian failure: Secondary | ICD-10-CM

## 2020-02-06 DIAGNOSIS — Z7952 Long term (current) use of systemic steroids: Secondary | ICD-10-CM | POA: Insufficient documentation

## 2020-02-06 DIAGNOSIS — K219 Gastro-esophageal reflux disease without esophagitis: Secondary | ICD-10-CM | POA: Insufficient documentation

## 2020-02-06 DIAGNOSIS — Z79811 Long term (current) use of aromatase inhibitors: Secondary | ICD-10-CM | POA: Diagnosis not present

## 2020-02-06 DIAGNOSIS — Z79899 Other long term (current) drug therapy: Secondary | ICD-10-CM | POA: Insufficient documentation

## 2020-02-06 DIAGNOSIS — Z170 Estrogen receptor positive status [ER+]: Secondary | ICD-10-CM | POA: Insufficient documentation

## 2020-02-06 DIAGNOSIS — C50411 Malignant neoplasm of upper-outer quadrant of right female breast: Secondary | ICD-10-CM | POA: Diagnosis not present

## 2020-02-06 DIAGNOSIS — Z791 Long term (current) use of non-steroidal anti-inflammatories (NSAID): Secondary | ICD-10-CM | POA: Diagnosis not present

## 2020-02-06 DIAGNOSIS — E782 Mixed hyperlipidemia: Secondary | ICD-10-CM | POA: Insufficient documentation

## 2020-02-06 NOTE — Progress Notes (Signed)
SURVIVORSHIP VISIT:   BRIEF ONCOLOGIC HISTORY:  Oncology History Overview Note  [facility] Gene Abnormality Annual 24 hr urine collection for VMA's and catecholamines   Paraganglioma, malignant ([place])  01/01/2012 Imaging   CT abdomen/pelvis no acute findings identified within the abdomen/pelvis. Slight increase in size of enlarge and enhancing periaortic LN   01/10/2012 PET scan   Intensely hypermetabolic solitary L periaortic LN is concerning for a lymphoproliferative process vs a solitary metastasis. Diffuse intensely hypermetabolic thyroid activity is most consistent with a benign thyroiditis    01/16/2012 Initial Biopsy   IR CT guided biopsy of L para aortic lymph node   01/16/2012 Pathology Results   Low grade neoplasm with neuroendocrine differentiation. DDX includes paraganglioma, pheochromocytoma, and carcinoid   01/30/2012 Imaging   US soft tissue head/neck diffusely enlarged hypervascular nodular thyroid gland compatible with thyroiditis   03/01/2012 Surgery   Exploratory laparotomy, resection of retroperitoneal neuroendocrine tumor 3 x 2 x 2 cm with Dr. Armandina Gemma   03/01/2012 Pathology Results   Paraganglioma with associated fibrosis 2.5 cm, negative margins   04/17/2014 PET scan   There is no evidence for residual or recurrent hypermetabolic tumor or adenopathy. 2. Diffusely increased radiotracer uptake throughout both lobes of the thyroid gland. Correlation with patient's TSH is recommended.    09/10/2015 Imaging   MRI neck with/without contrast at [facility]. No neck mass or LAD. Likely mildly asymmetric lingual tonsil tissue in the L vallecular. 1.3 cm likely calcified L thyroid nodule.    10/08/2015 Imaging   CT C/A/P No evidence for residual or recurrent tumor or adenopathy.   06/15/2016 Imaging   MRI brain- No acute abnormality.  Negative for metastatic disease  Several small subcortical white matter hyperintensities have developed since 2013. This is most  likely related to chronic microvascular ischemia.   06/15/2016 Imaging   CT CAP-   1. No evidence of thoracic metastasis. 1. No evidence of metastatic disease in the abdomen pelvis. 2. No periaortic adenopathy. 3. Sigmoid diverticulosis.  No diverticulitis.   Malignant neoplasm of upper-outer quadrant of right breast in female, estrogen receptor positive ([place])  10/14/2019 Cancer Staging   Staging form: Breast, AJCC 8th Edition - Clinical stage from 10/14/2019: Stage IA (cT1b, cN0, cM0, G2, ER+, PR-, HER2-)   10/22/2019 Initial Diagnosis   Screening mammogram showed a right breast mass. Diagnostic mammogram and US showed a 1.0cm right breast mass at the 10 o'clock position, no right axillary adenopathy. Biopsy showed IDC with DCIS, grade 2, HER-2 equivocal by IHC, negative by FISH, ER+ 95%, PR- 0%, Ki67 2%.   10/28/2019 Surgery   Right lumpectomy ([place]) ([id number]): IDC, 1.4cm, grade 2,  with intermediate grade DCIS. Clear margins. No regional lymph nodes were examined. ER+, PR-, HER-2 -   10/28/2019 Cancer Staging   Staging form: Breast, AJCC 8th Edition - Pathologic stage from 10/28/2019: Stage IA (pT1c, pN0, cM0, G2, ER+, PR-, HER2-)   10/2019 - 10/2024 Anti-estrogen oral therapy   Anastrozole     INTERVAL HISTORY:  [name] to review her survivorship care plan detailing her treatment course for breast cancer, as well as monitoring long-term side effects of that treatment, education regarding health maintenance, screening, and overall wellness and health promotion.     Overall, [name] reports feeling quite well.  She completed her survivorship survey which was normal.  She is taking anastrozole daily and has no issues with this.  She is due for a bone density test and she wants to do this  up at [facility].  She has longstanding osteoarthritis, but is otherwise doing well.    REVIEW OF SYSTEMS:  Review of Systems  Constitutional: Negative for appetite change, chills, fatigue, fever  and unexpected weight change.  HENT:   Negative for hearing loss, lump/mass and trouble swallowing.   Eyes: Negative for eye problems and icterus.  Respiratory: Negative for chest tightness, cough and shortness of breath.   Cardiovascular: Negative for chest pain, leg swelling and palpitations.  Gastrointestinal: Negative for abdominal distention, abdominal pain, constipation, diarrhea, nausea and vomiting.  Endocrine: Negative for hot flashes.  Genitourinary: Negative for difficulty urinating.   Musculoskeletal: Negative for arthralgias.  Skin: Negative for itching and rash.  Neurological: Negative for dizziness, extremity weakness, headaches and numbness.  Hematological: Negative for adenopathy. Does not bruise/bleed easily.  Psychiatric/Behavioral: Negative for depression. The patient is not nervous/anxious.    Breast: Denies any new nodularity, masses, tenderness, nipple changes, or nipple discharge.      ONCOLOGY TREATMENT TEAM:  1. Surgeon:  Dr. Harlow Asa at [facility] 2. Medical Oncologist: Dr. Lindi Adie      PAST MEDICAL/SURGICAL HISTORY:  Past Medical History:  Diagnosis Date  . Anemia   . Breast cancer ([place]) 09/2019   right breast   . Chronic back pain   . Chronic nausea   . Complication of anesthesia    hypotension, elevated heart rate and oxygenation desaturation  . Depression with anxiety   . Depression with anxiety   . Diverticulosis   . Dyspnea    due to myasthenia gravis  . Essential hypertension   . GERD (gastroesophageal reflux disease)   . History of pneumonia   . Hypothyroidism   . IBS (irritable colon syndrome)   . LVH (left ventricular hypertrophy)   . Migraines   . Mixed hyperlipidemia   . Obesity   . Ocular myasthenia gravis ([place])   . Osteoarthritis of knee   . PAF (paroxysmal atrial fibrillation) ([facility])    chads2vasc score is at least 4  . Paraganglioma ([place])    Resection 02/2012 (retroperitoneal)  . Paraganglioma, malignant ([place])  11/06/2015  . Presence of permanent cardiac pacemaker 07/18/2017  . Renal insufficiency   . Skin cancer    Past Surgical History:  Procedure Laterality Date  . ABDOMINAL HYSTERECTOMY    . ABDOMINAL HYSTERECTOMY    . CARDIAC CATHETERIZATION    . CHOLECYSTECTOMY    . COLONOSCOPY  08/24/2011   Procedure: COLONOSCOPY;  Surgeon: Rogene Houston, MD;  [facility];  Service: Endoscopy;  Laterality: N/A;  1200  . CT guided biopsy  01/16/12  . GIVENS CAPSULE STUDY  01/29/2012   Procedure: GIVENS CAPSULE STUDY;  Surgeon: Rogene Houston, MD;  Location: [facility];  Service: Endoscopy;  Laterality: N/A;  730  . KNEE SURGERY    . LAPAROTOMY  03/01/2012   Procedure: EXPLORATORY LAPAROTOMY;  Surgeon: Earnstine Regal, MD;  Location: [facility] ORS;  Service: General;  Laterality: N/A;  Exploratory Laparotomy ,Resection Retroperitoneal Mass  . MASTECTOMY, PARTIAL Right 10/28/2019   Procedure: RIGHT BREAST PARTIAL MASTECTOMY;  Surgeon: Armandina Gemma, MD;  Location: [facility];  Service: General;  Laterality: Right;  . NASAL SINUS SURGERY     30 yrs ago  . PACEMAKER IMPLANT N/A 07/18/2017   Procedure: PACEMAKER IMPLANT;  Surgeon: Evans Lance, MD;  Location: [facility];  Service: Cardiovascular;  Laterality: N/A;  . RIGHT/LEFT HEART CATH AND CORONARY ANGIOGRAPHY N/A 11/18/2018  Procedure: RIGHT/LEFT HEART CATH AND CORONARY ANGIOGRAPHY;  Surgeon: Belva Crome, MD;  Location: [facility];  Service: Cardiovascular;  Laterality: N/A;  . TOTAL KNEE ARTHROPLASTY Bilateral    2005 and 2011     ALLERGIES:  Allergies  Allergen Reactions  . Nitrofurantoin Macrocrystal Nausea And Vomiting  . Ampicillin Rash    Did it involve swelling of the face/tongue/throat, SOB, or low BP? No Did it involve sudden or severe rash/hives, skin peeling, or any reaction on the inside of your mouth or nose? No Did you need to seek medical attention at a hospital or doctor's office? No When  did it last happen?20 years ago If all above answers are "NO", may proceed with cephalosporin use.   Marland Kitchen Avocado Diarrhea and Nausea And Vomiting  . Biaxin [Clarithromycin] Rash  . Doxycycline Monohydrate Nausea And Vomiting  . Moxifloxacin     Contraindication myasthenia gravis  . Tetracyclines & Related Nausea And Vomiting     CURRENT MEDICATIONS:  Outpatient Encounter Medications as of 02/06/2020  Medication Sig Note  . ALPRAZolam (XANAX) 0.5 MG tablet Take 1 mg by mouth 2 (two) times daily. 01/20/2020: Patient states that she is taking 1/2 tablet at bedtime.  Marland Kitchen anastrozole (ARIMIDEX) 1 MG tablet Take 1 tablet (1 mg total) by mouth daily.   Marland Kitchen apixaban (ELIQUIS) 5 MG TABS tablet Take 1 tablet (5 mg total) by mouth 2 (two) times daily.   . Artificial Tear Solution (SOOTHE XP OP) Place 1 drop into both eyes 4 (four) times daily.   Marland Kitchen atenolol (TENORMIN) 25 MG tablet TAKE (1) TABLET BY MOUTH ONCE DAILY. MAY TAKE AN ADDITIONAL TABLET IF SYSTOLIC OVER 761 OR SEVERE PALPITATIONS.   Marland Kitchen atorvastatin (LIPITOR) 80 MG tablet Take 80 mg by mouth daily.   . B Complex-C (B-COMPLEX WITH VITAMIN C) tablet Take 1 tablet by mouth daily with supper.    . cholecalciferol (VITAMIN D) 1000 units tablet Take 1,000 Units by mouth 2 (two) times a day.    . Coenzyme Q10 (COQ10) 100 MG CAPS Take 100 mg by mouth daily.    . diclofenac sodium (VOLTAREN) 1 % GEL Apply 4 g topically 4 (four) times daily as needed (for pain).    Marland Kitchen gabapentin (NEURONTIN) 300 MG capsule Take 1 capsule (300 mg total) by mouth 2 (two) times daily.   . lansoprazole (PREVACID) 30 MG capsule TAKE (1) CAPSULE BY MOUTH TWICE DAILY. (Patient taking differently: daily at 12 noon. TAKE (1) CAPSULE BY MOUTH TWICE DAILY.) 01/20/2020: Patient reports that she is taking 1 at supper.  . levothyroxine (SYNTHROID) 112 MCG tablet Take 112 mcg by mouth daily before breakfast.   . lidocaine (LIDODERM) 5 % Place 1-3 patches onto the skin daily as needed (for  pain). Remove & Discard patch within 12 hours or as directed by MD for pain   . mycophenolate (CELLCEPT) 500 MG tablet Take 1 tablet (500 mg total) by mouth 2 (two) times daily.   Marland Kitchen oxyCODONE-acetaminophen (PERCOCET) 10-325 MG tablet Take 1 tablet by mouth every 6 (six) hours as needed (pain).    . predniSONE (DELTASONE) 5 MG tablet Take 2 tablets (10 mg total) by mouth daily with breakfast.   . pyridostigmine (MESTINON) 60 MG tablet Take 0.5 tablets (30 mg total) by mouth 4 (four) times daily.   . SUMAtriptan (IMITREX) 50 MG tablet Take 50 mg by mouth every 2 (two) hours as needed. For migraines 05/22/2019: 05/22/19 take rarely  . Terbinafine 1 %  GEL Apply 1 application topically 2 (two) times daily as needed (toe nail fungus).    . traZODone (DESYREL) 50 MG tablet TAKE (1) TABLET BY MOUTH AT BEDTIME.   Marland Kitchen TURMERIC PO Take 1,000 mg by mouth daily with supper.  01/20/2020: Patient reports she takes once a day.   No facility-administered encounter medications on file as of 02/06/2020.     ONCOLOGIC FAMILY HISTORY:  Family History  Problem Relation Age of Onset  . Inflammatory bowel disease Maternal Grandmother      GENETIC COUNSELING/TESTING: Not at this time  SOCIAL HISTORY:  Social History   Socioeconomic History  . Marital status: Widowed    Spouse name: Not on file  . Number of children: Not on file  . Years of education: Not on file  . Highest education level: Not on file  Occupational History  . Occupation: Retired Quarry manager  Tobacco Use  . Smoking status: Never Smoker  . Smokeless tobacco: Never Used  Vaping Use  . Vaping Use: Never used  Substance and Sexual Activity  . Alcohol use: No    Alcohol/week: 0.0 standard drinks  . Drug use: No  . Sexual activity: Not on file  Other Topics Concern  . Not on file  Social History Narrative   Lives in [place] alone.  Retired Quarry manager.   Caffeine use:    Right handed   Social Determinants of Health   Financial  Resource Strain:   . Difficulty of Paying Living Expenses: Not on file  Food Insecurity:   . Worried About Charity fundraiser in the Last Year: Not on file  . Ran Out of Food in the Last Year: Not on file  Transportation Needs:   . Lack of Transportation (Medical): Not on file  . Lack of Transportation (Non-Medical): Not on file  Physical Activity:   . Days of Exercise per Week: Not on file  . Minutes of Exercise per Session: Not on file  Stress:   . Feeling of Stress : Not on file  Social Connections:   . Frequency of Communication with Friends and Family: Not on file  . Frequency of Social Gatherings with Friends and Family: Not on file  . Attends Religious Services: Not on file  . Active Member of Clubs or Organizations: Not on file  . Attends Archivist Meetings: Not on file  . Marital Status: Not on file  Intimate Partner Violence:   . Fear of Current or Ex-Partner: Not on file  . Emotionally Abused: Not on file  . Physically Abused: Not on file  . Sexually Abused: Not on file     OBSERVATIONS/OBJECTIVE:  BP 127/83 (BP Location: Left Arm, Patient Position: Sitting)   Pulse 75   Temp 97.8 F (36.6 C) (Tympanic)   Resp 18   Ht _0  (1.6 m)   Wt 181 lb 8 oz (82.3 kg)   SpO2 96%   BMI 32.15 kg/m  GENERAL: Patient is a well appearing female in no acute distress HEENT:  Sclerae anicteric.  Mask in place. Neck is supple.  NODES:  No cervical, supraclavicular, or axillary lymphadenopathy palpated.  BREAST EXAM:  Deferred today , no breast concerns LUNGS:  Clear to auscultation bilaterally.  No wheezes or rhonchi. HEART:  Regular rate and rhythm. No murmur appreciated. ABDOMEN:  Soft, nontender.  Positive, normoactive bowel sounds. No organomegaly palpated. MSK:  No focal spinal tenderness to palpation. Full range of motion bilaterally in the upper  extremities. EXTREMITIES:  No peripheral edema.   SKIN:  Clear with no obvious rashes or skin changes. No nail  dyscrasia. NEURO:  Nonfocal. Well oriented.  Appropriate affect.    LABORATORY DATA:  None for this visit.  DIAGNOSTIC IMAGING:  None for this visit.      ASSESSMENT AND PLAN:  Ms.. Nolan is a pleasant 79 y.o. female with Stage IA right breast invasive ductal carcinoma, ER+/PR-/HER2-, diagnosed in 09/2019, treated with lumpectomy, and anti-estrogen therapy with Anastrozole beginning in 10/2019.  She presents to the Survivorship Clinic for our initial meeting and routine follow-up post-completion of treatment for breast cancer.    1. Stage IA right breast cancer:  [name] is continuing to recover from definitive treatment for breast cancer. She will follow-up with her medical oncologist, Dr. Lindi Adie in 6 months with history and physical exam per surveillance protocol.  She will continue her anti-estrogen therapy with Anastrozole. Thus far, she is tolerating the Anastrozole well, with minimal side effects. She was instructed to make Dr. Lindi Adie or myself aware if she begins to experience any worsening side effects of the medication and I could see her back in clinic to help manage those side effects, as needed. Her mammogram is due 08/2020; orders placed today.   Today, a comprehensive survivorship care plan and treatment summary was reviewed with the patient today detailing her breast cancer diagnosis, treatment course, potential late/long-term effects of treatment, appropriate follow-up care with recommendations for the future, and patient education resources.  A copy of this summary, along with a letter will be sent to the patient's primary care provider via mail/fax/In Basket message after today's visit.    2. Bone health:  Given [name]'s age/history of breast cancer and her current treatment regimen including anti-estrogen therapy with Anastrozole, she is at risk for bone demineralization.  I placed orders for bone density testing to be completed in the next few weeks at [facility]   She was given education on specific activities to promote bone health.  3. Cancer screening:  Due to [name]'s history and her age, she should receive screening for skin cancers, colon cancer.  The information and recommendations are listed on the patient's comprehensive care plan/treatment summary and were reviewed in detail with the patient.    4. Health maintenance and wellness promotion: [name] was encouraged to consume 5-7 servings of fruits and vegetables per day. We reviewed the "Nutrition Rainbow" handout, as well as the handout "Take Control of Your Health and Reduce Your Cancer Risk" from the [place].  She was also encouraged to engage in moderate to vigorous exercise for 30 minutes per day most days of the week. We discussed the [employer] fitness program, which is designed for cancer survivors to help them become more physically fit after cancer treatments.  She was instructed to limit her alcohol consumption and continue to abstain from tobacco use.     5. Support services/counseling: It is not uncommon for this period of the patient's cancer care trajectory to be one of many emotions and stressors.  We discussed how this can be increasingly difficult during the times of quarantine and social distancing due to the COVID-19 pandemic.   She was given information regarding our available services and encouraged to contact me with any questions or for help enrolling in any of our support group/programs.    Follow up instructions:    -Return to cancer center in 6 months for f/u with Dr.  Lindi Adie  -Mammogram due in 08/2020 -Bone density due -She is welcome to return back to the Survivorship Clinic at any time; no additional follow-up needed at this time.  -Consider referral back to survivorship as a long-term survivor for continued surveillance  The patient was provided an opportunity to ask questions and all were answered. The patient agreed with the plan and demonstrated  an understanding of the instructions.   Total encounter time: 30 minutes*  Wilber Bihari, NP 02/06/20 11:12 AM Medical Oncology and Hematology [facility] [place], [place] [address] Tel. [phone]    Fax. [phone]  *Total Encounter Time as defined by the Centers for Medicare and Medicaid Services includes, in addition to the face-to-face time of a patient visit (documented in the note above) non-face-to-face time: obtaining and reviewing outside history, ordering and reviewing medications, tests or procedures, care coordination (communications with other health care professionals or caregivers) and documentation in the medical record.

## 2020-02-08 ENCOUNTER — Encounter: Payer: Self-pay | Admitting: Specialist

## 2020-02-09 ENCOUNTER — Telehealth: Payer: Self-pay | Admitting: Hematology and Oncology

## 2020-02-09 ENCOUNTER — Ambulatory Visit: Payer: Medicare Other | Admitting: Specialist

## 2020-02-09 ENCOUNTER — Other Ambulatory Visit: Payer: Self-pay | Admitting: Radiology

## 2020-02-09 DIAGNOSIS — M1611 Unilateral primary osteoarthritis, right hip: Secondary | ICD-10-CM

## 2020-02-09 NOTE — Telephone Encounter (Signed)
Scheduled appts per 11/19 los. Pt's sister confirmed appt date and time.

## 2020-02-14 DIAGNOSIS — Z23 Encounter for immunization: Secondary | ICD-10-CM | POA: Diagnosis not present

## 2020-02-16 ENCOUNTER — Ambulatory Visit (INDEPENDENT_AMBULATORY_CARE_PROVIDER_SITE_OTHER): Payer: Medicare Other | Admitting: Physical Medicine and Rehabilitation

## 2020-02-16 ENCOUNTER — Encounter: Payer: Self-pay | Admitting: Physical Medicine and Rehabilitation

## 2020-02-16 ENCOUNTER — Encounter: Payer: Self-pay | Admitting: Adult Health

## 2020-02-16 ENCOUNTER — Ambulatory Visit: Payer: Self-pay

## 2020-02-16 ENCOUNTER — Other Ambulatory Visit: Payer: Self-pay

## 2020-02-16 ENCOUNTER — Encounter: Payer: Self-pay | Admitting: Specialist

## 2020-02-16 DIAGNOSIS — M25551 Pain in right hip: Secondary | ICD-10-CM | POA: Diagnosis not present

## 2020-02-16 MED ORDER — TRIAMCINOLONE ACETONIDE 40 MG/ML IJ SUSP
60.0000 mg | INTRAMUSCULAR | Status: AC | PRN
  Administered 2020-02-16: 60 mg via INTRA_ARTICULAR

## 2020-02-16 MED ORDER — BUPIVACAINE HCL 0.25 % IJ SOLN
4.0000 mL | INTRAMUSCULAR | Status: AC | PRN
  Administered 2020-02-16: 4 mL via INTRA_ARTICULAR

## 2020-02-16 NOTE — Progress Notes (Signed)
   [name] - 79 y.o. female MRN [id number]  Date of birth: 18-Apr-1940  Office Visit Note: Visit Date: 02/16/2020 PCP: [name], MD Referred by: [name], MD  Subjective: Chief Complaint  Patient presents with  . Right Hip - Pain   HPI:  [name] is a 79 y.o. female who comes in today at the request of Dr. Basil Dess for planned Right anesthetic hip arthrogram with fluoroscopic guidance.  The patient has failed conservative care including home exercise, medications, time and activity modification.  This injection will be diagnostic and hopefully therapeutic.  Please see requesting physician notes for further details and justification.   ROS Otherwise per HPI.  Assessment & Plan: Visit Diagnoses:    ICD-10-CM   1. Pain in right hip  M25.551 XR C-ARM NO REPORT    Plan: No additional findings.   Meds & Orders: No orders of the defined types were placed in this encounter.   Orders Placed This Encounter  Procedures  . Large Joint Inj  . XR C-ARM NO REPORT    Follow-up: Return for visit to requesting physician as needed.   Procedures: Large Joint Inj: R hip joint on 02/16/2020 2:58 PM Indications: pain and diagnostic evaluation Details: 22 G needle, anterior approach  Arthrogram: Yes  Medications: 4 mL bupivacaine 0.25 %; 60 mg triamcinolone acetonide 40 MG/ML Outcome: tolerated well, no immediate complications  Arthrogram demonstrated excellent flow of contrast throughout the joint surface without extravasation or obvious defect.  The patient had relief of symptoms during the anesthetic phase of the injection.  Procedure, treatment alternatives, risks and benefits explained, specific risks discussed. Consent was given by the patient. Immediately prior to procedure a time out was called to verify the correct patient, procedure, equipment, support staff and site/side marked as required. Patient was prepped and draped in the usual sterile fashion.      No  notes on file   Clinical History: Hip Xray 12/04/2019 Ap and lateral radiographs right hip with moderated DJD with joint line  narrowing and superolateral and inferomedial no acute changes. Bilateral  SI joint sclerosis is present with a degenerative scoliosis of the lumbar  spine with DDD in the lower segment visualized. There is left lateral  listhesis L4-5.     Objective:  VS:  HT:    WT:   BMI:     BP:   HR: bpm  TEMP: ( )  RESP:  Physical Exam   Imaging: No results found.

## 2020-02-16 NOTE — Progress Notes (Signed)
Right buttock, groin, and thigh pain. Worse with weight bearing.  Numeric Pain Rating Scale and Functional Assessment Average Pain 5   In the last MONTH (on 0-10 scale) has pain interfered with the following?  1. General activity like being  able to carry out your everyday physical activities such as walking, climbing stairs, carrying groceries, or moving a chair?  Rating(4)    -Dye Allergies.

## 2020-02-17 ENCOUNTER — Telehealth: Payer: Self-pay

## 2020-02-17 NOTE — Telephone Encounter (Signed)
This LPN called [facility] for pt to have Bone Density appt. Pt has appt for 03/08/20 at 1430. This LPN spoke with Charisse Klinefelter who arranges pt appts. [name] understands to arrive at 1415 for check in, pt should wear easy removable clothing, and this LPN verified pt is not currently taking calcium supplement. [name] verbalized thanks and understanding.

## 2020-02-26 DIAGNOSIS — H26491 Other secondary cataract, right eye: Secondary | ICD-10-CM | POA: Diagnosis not present

## 2020-03-01 DIAGNOSIS — H26492 Other secondary cataract, left eye: Secondary | ICD-10-CM | POA: Diagnosis not present

## 2020-03-05 ENCOUNTER — Ambulatory Visit: Payer: Medicare Other | Admitting: Specialist

## 2020-03-08 ENCOUNTER — Ambulatory Visit (HOSPITAL_COMMUNITY)
Admission: RE | Admit: 2020-03-08 | Discharge: 2020-03-08 | Disposition: A | Discharge status: Home / Self Care | Payer: Medicare Other | Source: Ambulatory Visit | Attending: Adult Health | Admitting: Adult Health

## 2020-03-08 ENCOUNTER — Other Ambulatory Visit: Payer: Self-pay

## 2020-03-08 DIAGNOSIS — R29890 Loss of height: Secondary | ICD-10-CM | POA: Diagnosis not present

## 2020-03-08 DIAGNOSIS — E2839 Other primary ovarian failure: Secondary | ICD-10-CM | POA: Insufficient documentation

## 2020-03-08 DIAGNOSIS — M85852 Other specified disorders of bone density and structure, left thigh: Secondary | ICD-10-CM | POA: Diagnosis not present

## 2020-03-08 DIAGNOSIS — Z780 Asymptomatic menopausal state: Secondary | ICD-10-CM | POA: Diagnosis not present

## 2020-03-09 ENCOUNTER — Telehealth: Payer: Self-pay | Admitting: *Deleted

## 2020-03-09 ENCOUNTER — Encounter: Payer: Self-pay | Admitting: Hematology and Oncology

## 2020-03-09 ENCOUNTER — Telehealth: Payer: Self-pay | Admitting: Neurology

## 2020-03-09 DIAGNOSIS — Z5181 Encounter for therapeutic drug level monitoring: Secondary | ICD-10-CM

## 2020-03-09 NOTE — Telephone Encounter (Signed)
Reviewed recent bone density report with MD.  T score -1.1.  Per MD pt needing to start taking OTC calcium 1,200 mg daily and Vitamin D 1,000 mg daily.  RN placed call to pt daughter and [name] to explain OTC medications.  [name] verbalized understanding and states she will pick some up for the pt.

## 2020-03-09 NOTE — Telephone Encounter (Signed)
I called and left a message for the patient to come by and get blood work done on CellCept, the last check showed a mild elevation of liver enzymes.

## 2020-03-15 ENCOUNTER — Encounter: Payer: Self-pay | Admitting: Specialist

## 2020-03-16 ENCOUNTER — Other Ambulatory Visit (INDEPENDENT_AMBULATORY_CARE_PROVIDER_SITE_OTHER): Payer: Self-pay

## 2020-03-16 DIAGNOSIS — Z5181 Encounter for therapeutic drug level monitoring: Secondary | ICD-10-CM

## 2020-03-16 DIAGNOSIS — Z0289 Encounter for other administrative examinations: Secondary | ICD-10-CM

## 2020-03-17 ENCOUNTER — Encounter: Payer: Self-pay | Admitting: Specialist

## 2020-03-17 LAB — CBC WITH DIFFERENTIAL/PLATELET
Basophils Absolute: 0 10*3/uL (ref 0.0–0.2)
Basos: 0 %
EOS (ABSOLUTE): 0 10*3/uL (ref 0.0–0.4)
Eos: 0 %
Hematocrit: 40.5 % (ref 34.0–46.6)
Hemoglobin: 12.9 g/dL (ref 11.1–15.9)
Immature Grans (Abs): 0 10*3/uL (ref 0.0–0.1)
Immature Granulocytes: 1 %
Lymphocytes Absolute: 1.3 10*3/uL (ref 0.7–3.1)
Lymphs: 16 %
MCH: 31.6 pg (ref 26.6–33.0)
MCHC: 31.9 g/dL (ref 31.5–35.7)
MCV: 99 fL — ABNORMAL HIGH (ref 79–97)
Monocytes Absolute: 0.4 10*3/uL (ref 0.1–0.9)
Monocytes: 5 %
Neutrophils Absolute: 6.5 10*3/uL (ref 1.4–7.0)
Neutrophils: 78 %
Platelets: 175 10*3/uL (ref 150–450)
RBC: 4.08 x10E6/uL (ref 3.77–5.28)
RDW: 12.4 % (ref 11.7–15.4)
WBC: 8.3 10*3/uL (ref 3.4–10.8)

## 2020-03-17 LAB — COMPREHENSIVE METABOLIC PANEL
ALT: 18 IU/L (ref 0–32)
AST: 18 IU/L (ref 0–40)
Albumin/Globulin Ratio: 2 (ref 1.2–2.2)
Albumin: 4.1 g/dL (ref 3.6–4.6)
Alkaline Phosphatase: 57 IU/L (ref 44–121)
BUN/Creatinine Ratio: 15 (ref 12–28)
BUN: 16 mg/dL (ref 8–27)
Bilirubin Total: 0.4 mg/dL (ref 0.0–1.2)
CO2: 25 mmol/L (ref 20–29)
Calcium: 9.7 mg/dL (ref 8.7–10.3)
Chloride: 101 mmol/L (ref 96–106)
Creatinine, Ser: 1.08 mg/dL — ABNORMAL HIGH (ref 0.57–1.00)
GFR calc Af Amer: 56 mL/min/{1.73_m2} — ABNORMAL LOW (ref >59)
GFR calc non Af Amer: 48 mL/min/{1.73_m2} — ABNORMAL LOW (ref >59)
Globulin, Total: 2.1 g/dL (ref 1.5–4.5)
Glucose: 109 mg/dL — ABNORMAL HIGH (ref 65–99)
Potassium: 4.3 mmol/L (ref 3.5–5.2)
Sodium: 138 mmol/L (ref 134–144)
Total Protein: 6.2 g/dL (ref 6.0–8.5)

## 2020-03-18 ENCOUNTER — Other Ambulatory Visit: Payer: Self-pay

## 2020-03-18 ENCOUNTER — Ambulatory Visit (INDEPENDENT_AMBULATORY_CARE_PROVIDER_SITE_OTHER): Payer: Medicare Other | Admitting: Specialist

## 2020-03-18 ENCOUNTER — Encounter: Payer: Self-pay | Admitting: Specialist

## 2020-03-18 VITALS — BP 141/84 | HR 70 | Ht 63.0 in | Wt 181.5 lb

## 2020-03-18 DIAGNOSIS — M4807 Spinal stenosis, lumbosacral region: Secondary | ICD-10-CM

## 2020-03-18 DIAGNOSIS — M1611 Unilateral primary osteoarthritis, right hip: Secondary | ICD-10-CM | POA: Diagnosis not present

## 2020-03-18 DIAGNOSIS — M19211 Secondary osteoarthritis, right shoulder: Secondary | ICD-10-CM

## 2020-03-18 DIAGNOSIS — M25511 Pain in right shoulder: Secondary | ICD-10-CM

## 2020-03-18 DIAGNOSIS — M4156 Other secondary scoliosis, lumbar region: Secondary | ICD-10-CM

## 2020-03-18 DIAGNOSIS — M1711 Unilateral primary osteoarthritis, right knee: Secondary | ICD-10-CM | POA: Diagnosis not present

## 2020-03-18 DIAGNOSIS — M1712 Unilateral primary osteoarthritis, left knee: Secondary | ICD-10-CM

## 2020-03-18 DIAGNOSIS — R29898 Other symptoms and signs involving the musculoskeletal system: Secondary | ICD-10-CM

## 2020-03-18 NOTE — Progress Notes (Signed)
Office Visit Note   Patient: [name]           Date of Birth: 01-11-1941           MRN: [id number] Visit Date: 03/18/2020              Requested by: [name], MD [address],  [place] [address] PCP: [name], MD   Assessment & Plan: Visit Diagnoses:  1. Unilateral primary osteoarthritis, left knee   2. Unilateral primary osteoarthritis, right knee   3. Unilateral primary osteoarthritis, right hip   4. Secondary osteoarthritis of right shoulder due to rotator cuff arthropathy   5. Spinal stenosis of lumbosacral region   6. Right shoulder pain, unspecified chronicity   7. Other secondary scoliosis, lumbar region   8. Weakness of shoulder     Plan:  Avoid overhead lifting and overhead use of the arms. Pillows to keep from sleeping directly on the shoulders Limited lifting to less than 10 lbs. Ice or heat for relief. You can not take NSAIDs due to use of eliquis. Stretching exercise help and strengthening is helpful to build endurance. Referral to Dr. Maureen Ralphs for evaluation and treatment of right hip osteoarthritis. Ice to the right shoulder 30 min on and 30 min off for 24-48 hours not while sleeping is okay.   Secondary Shoulder Impingement Syndrome Rehab Ask your health care provider which exercises are safe for you. Do exercises exactly as told by your health care provider and adjust them as directed. It is normal to feel mild stretching, pulling, tightness, or discomfort as you do these exercises. Stop right away if you feel sudden pain or your pain gets worse. Do not begin these exercises until told by your health care provider. Stretching and range-of-motion exercise This exercise warms up your muscles and joints and improves the movement and flexibility of your neck and shoulder. This exercise also helps to relieve pain and stiffness. Cervical side bend  1. Using good posture, sit on a stable chair, or stand up. 2. Without moving your shoulders,  slowly tilt your left / right ear toward your left / right shoulder until you feel a stretch in your neck (cervical) muscles on the other side. You should be looking straight ahead. 3. Hold for ____10______ seconds. 4. Slowly return to the starting position. 5. Repeat the stretch on your left / right side. Repeat _____10_____ times. Complete this exercise _____3_____ times a day. Strengthening exercises These exercises build strength and endurance in your shoulder. Endurance is the ability to use your muscles for a long time, even after they get tired. Scapular protraction, supine  1. Lie on your back on a firm surface (supine position). Hold a ___2 lb_______ weight in your left / right hand. 2. Raise your left / right arm straight into the air so your hand is directly above your shoulder joint. 3. Push the weight into the air so your shoulder (scapula) lifts off the surface that you are lying on. The scapula will push up or forward (protraction). Do not move your head, neck, or back. 4. Hold for ________10__ seconds. 5. Slowly return to the starting position. Let your muscles relax completely before you repeat this exercise. Repeat ______10____ times. Complete this exercise _____3____ times a day. Scapular retraction  1. Sit in a stable chair without armrests, or stand up. 2. Secure an exercise band to a stable object in front of you so the band is at  shoulder height. 3. Hold one end of the exercise band in each hand. Your palms should face down. 4. Squeeze your shoulder blades (scapulae) together and move your elbows slightly behind you (retraction). Do not shrug your shoulders upward while you do this. 5. Hold for ____10______ seconds. 6. Slowly return to the starting position. Repeat ________10_ times. Complete this exercise ________3__ times a day. Shoulder extension with scapular retraction  1. Sit in a stable chair without armrests, or stand up. 2. Secure an exercise band to a  stable object in front of you so the band is above shoulder height. 3. Hold one end of the exercise band in each hand. 4. Straighten your elbows and lift your hands up to shoulder height. 5. Squeeze your shoulder blades together (scapular retraction) and pull your hands down to the sides of your thighs (shoulder extension). Stop when your hands are straight down by your sides. Do not let your hands go behind your body. 6. Hold for ____10______ seconds. 7. Slowly return to the starting position. Repeat ____10_____ times. Complete this exercise _____3_____ times a day. Shoulder abduction 1. Sit in a stable chair without armrests, or stand up. 2. If directed, hold a _____2 lb____ weight in your left / right hand. 3. Start with your arms straight down. Turn your left / right hand so your palm faces in, toward your body. 4. Slowly lift your left / right hand out to your side (abduction). Do not lift your hand above shoulder height. ? Keep your arms straight. ? Avoid shrugging your shoulder while you do this movement. Keep your shoulder blade tucked down toward the middle of your back. 5. Hold for ___10_______ seconds. 6. Slowly lower your arm, and return to the starting position. Repeat ____10_____ times. Complete this exercise ___3______ times a day. This information is not intended to replace advice given to you by your health care provider. Make sure you discuss any questions you have with your health care provider. Document Revised: 06/28/2018 Document Reviewed: 04/11/2018 Elsevier Patient Education  [facility] Instructions: Return in about 4 weeks (around 04/15/2020) for Dr. Marlou Sa for evaluation and treatement of chronic rotator cuff tear..   Orders:  Orders Placed This Encounter  Procedures  . CT SHOULDER RIGHT W CONTRAST  . Ambulatory referral to Orthopedic Surgery   No orders of the defined types were placed in this encounter.     Procedures: No procedures  performed   Clinical Data: No additional findings.   Subjective: Chief Complaint  Patient presents with  . Right Hip - Pain    Injection helped for 2 weeks and came back worse  . Right Shoulder - Pain    Trouble with ROM    79 year old female with diffuse arthritis, she has taken hydrocodone in the past and is now taking oxycontin for control of her pain. She has had right hip injection and the injection did help. She has exercised but when she does so she hurts. No arm numbness but there is weakness in the right shoulder. Uses lidocaine patches and voltaren gel. She has an indwelling pacemaker and stopped taking NSAIDs with the pacemaker placement. She has SIC sinus syndrome and is on eliquis. She retired about 11 years ago at age 61. 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 Unable to sleep on the right shoulder. She has taken prednisone and gabapentin. She reports that she is experiencing face and  leg swelling with the meds. She was on prednisone for pain and pharmacist is concerned that her bone density testing was done at [facility] with dx of osteopenia. T score is -1.1 for femoral neck which is borderline Normal-osteopenia.   Review of Systems  Constitutional: Negative.   HENT: Negative.   Eyes: Negative.   Respiratory: Negative.   Cardiovascular: Negative.   Gastrointestinal: Negative.   Endocrine: Negative.   Genitourinary: Negative.   Musculoskeletal: Negative.   Skin: Negative.   Allergic/Immunologic: Negative.   Neurological: Negative.   Hematological: Negative.   Psychiatric/Behavioral: Negative.      Objective: Vital Signs: BP (!) 141/84 (BP Location: Left Arm, Patient Position: Sitting)   Pulse 70   Ht 5\' 3"  (1.6 m)   Wt 181 lb 8 oz (82.3 kg)   BMI 32.15 kg/m   Physical Exam Constitutional:      Appearance: She is well-developed and well-nourished.  HENT:     Head: Normocephalic and atraumatic.  Eyes:      Extraocular Movements: EOM normal.     Pupils: Pupils are equal, round, and reactive to light.  Pulmonary:     Effort: Pulmonary effort is normal.     Breath sounds: Normal breath sounds.  Abdominal:     General: Bowel sounds are normal.     Palpations: Abdomen is soft.  Musculoskeletal:     Cervical back: Normal range of motion and neck supple.     Lumbar back: Negative right straight leg raise test and negative left straight leg raise test.  Skin:    General: Skin is warm and dry.  Neurological:     Mental Status: She is alert and oriented to person, place, and time.  Psychiatric:        Mood and Affect: Mood and affect normal.        Behavior: Behavior normal.        Thought Content: Thought content normal.        Judgment: Judgment normal.     Back Exam   Tenderness  The patient is experiencing tenderness in the lumbar.  Range of Motion  Extension: abnormal  Flexion: abnormal  Lateral bend right: abnormal  Lateral bend left: abnormal  Rotation right: abnormal  Rotation left: abnormal   Muscle Strength  Right Quadriceps:  5/5  Left Quadriceps:  5/5  Right Hamstrings:  5/5  Left Hamstrings:  5/5   Tests  Straight leg raise right: negative Straight leg raise left: negative  Reflexes  Patellar: 0/4 Achilles: 0/4  Other  Toe walk: abnormal Heel walk: abnormal   Right Shoulder Exam   Tenderness  The patient is experiencing tenderness in the acromion.  Range of Motion  Active abduction:  140 abnormal  Passive abduction:  150 abnormal  Extension: 30  External rotation: 70  Forward flexion: 150  Internal rotation 0 degrees: abnormal  Internal rotation 90 degrees: abnormal   Muscle Strength  Abduction: 4/5  Internal rotation: 4/5  External rotation: 4/5  Supraspinatus: 4/5  Subscapularis: 4/5   Tests  Impingement: positive Drop arm: positive  Other  Erythema: absent Scars: absent Pulse: present      Specialty Comments:  No specialty  comments available.  Imaging: No results found.   PMFS History: Patient Active Problem List   Diagnosis Date Noted  . Malignant neoplasm of upper-outer quadrant of right breast in female, estrogen receptor positive ([place]) 10/22/2019  . Atypical chest pain 09/23/2018  . Dilated bile duct 09/23/2018  .  Sinus node dysfunction ([place]) 07/18/2017  . Dyspnea on exertion 06/25/2017  . Paraganglioma, malignant ([place]) 11/06/2015  . Hypertensive cardiovascular disease 05/25/2014  . Malnutrition of moderate degree ([place]) 04/28/2014  . Thrombocytopenia ([place]) 04/28/2014  . UTI (urinary tract infection) 04/28/2014  . Physical deconditioning 04/27/2014  . Ocular myasthenia ([place]) 04/27/2014  . Depression with anxiety 04/27/2014  . Chronic diastolic CHF (congestive heart failure) ([place]) 04/27/2014  . Bradycardia   . Orthostatic hypotension   . AKI (acute kidney injury) ([place]) 04/05/2014  . Palpitations 04/04/2014  . Paroxysmal atrial fibrillation ([place]) 04/04/2014  . Chest pain 04/01/2014  . Sinus bradycardia 04/01/2014  . Weight loss 06/10/2012  . Abdominal pain, bilateral lower quadrant 09/26/2011  . Hypertension 07/26/2011  . Hypothyroidism 07/26/2011  . Arthritis 07/26/2011  . High cholesterol 07/26/2011  . GERD (gastroesophageal reflux disease) 07/26/2011  . Rectal bleeding 07/26/2011  . IBS (irritable bowel syndrome) 07/26/2011   Past Medical History:  Diagnosis Date  . Anemia   . Breast cancer ([place]) 09/2019   right breast   . Chronic back pain   . Chronic nausea   . Complication of anesthesia    hypotension, elevated heart rate and oxygenation desaturation  . Depression with anxiety   . Depression with anxiety   . Diverticulosis   . Dyspnea    due to myasthenia gravis  . Essential hypertension   . GERD (gastroesophageal reflux disease)   . History of pneumonia   . Hypothyroidism   . IBS (irritable colon syndrome)   . LVH (left ventricular hypertrophy)   . Migraines   .  Mixed hyperlipidemia   . Obesity   . Ocular myasthenia gravis ([place])   . Osteoarthritis of knee   . PAF (paroxysmal atrial fibrillation) ([facility])    chads2vasc score is at least 4  . Paraganglioma ([place])    Resection 02/2012 (retroperitoneal)  . Paraganglioma, malignant ([place]) 11/06/2015  . Presence of permanent cardiac pacemaker 07/18/2017  . Renal insufficiency   . Skin cancer     Family History  Problem Relation Age of Onset  . Inflammatory bowel disease Maternal Grandmother     Past Surgical History:  Procedure Laterality Date  . ABDOMINAL HYSTERECTOMY    . ABDOMINAL HYSTERECTOMY    . CARDIAC CATHETERIZATION    . CHOLECYSTECTOMY    . COLONOSCOPY  08/24/2011   Procedure: COLONOSCOPY;  Surgeon: Rogene Houston, MD;  Location: [facility];  Service: Endoscopy;  Laterality: N/A;  1200  . CT guided biopsy  01/16/12  . GIVENS CAPSULE STUDY  01/29/2012   Procedure: GIVENS CAPSULE STUDY;  Surgeon: Rogene Houston, MD;  Location: [facility];  Service: Endoscopy;  Laterality: N/A;  730  . KNEE SURGERY    . LAPAROTOMY  03/01/2012   Procedure: EXPLORATORY LAPAROTOMY;  Surgeon: Earnstine Regal, MD;  Location: [facility];  Service: General;  Laterality: N/A;  Exploratory Laparotomy ,Resection Retroperitoneal Mass  . MASTECTOMY, PARTIAL Right 10/28/2019   Procedure: RIGHT BREAST PARTIAL MASTECTOMY;  Surgeon: Armandina Gemma, MD;  Location: [facility];  Service: General;  Laterality: Right;  . NASAL SINUS SURGERY     30 yrs ago  . PACEMAKER IMPLANT N/A 07/18/2017   Procedure: PACEMAKER IMPLANT;  Surgeon: Evans Lance, MD;  Location: [facility];  Service: Cardiovascular;  Laterality: N/A;  . RIGHT/LEFT HEART CATH AND CORONARY ANGIOGRAPHY N/A 11/18/2018   Procedure: RIGHT/LEFT HEART CATH AND CORONARY ANGIOGRAPHY;  Surgeon: Belva Crome, MD;  Location:  [facility];  Service: Cardiovascular;  Laterality: N/A;  . TOTAL KNEE ARTHROPLASTY Bilateral    2005 and 2011   Social  History   Occupational History  . Occupation: Retired Administrator, arts  Tobacco Use  . Smoking status: Never Smoker  . Smokeless tobacco: Never Used  Vaping Use  . Vaping Use: Never used  Substance and Sexual Activity  . Alcohol use: No    Alcohol/week: 0.0 standard drinks  . Drug use: No  . Sexual activity: Not on file

## 2020-03-18 NOTE — Patient Instructions (Signed)
Avoid overhead lifting and overhead use of the arms. Pillows to keep from sleeping directly on the shoulders Limited lifting to less than 10 lbs. Ice or heat for relief. You can not take NSAIDs due to use of eliquis. Stretching exercise help and strengthening is helpful to build endurance. Referral to Dr. Maureen Ralphs for evaluation and treatment of right hip osteoarthritis. Ice to the right shoulder 30 min on and 30 min off for 24-48 hours not while sleeping is okay.   Secondary Shoulder Impingement Syndrome Rehab Ask your health care provider which exercises are safe for you. Do exercises exactly as told by your health care provider and adjust them as directed. It is normal to feel mild stretching, pulling, tightness, or discomfort as you do these exercises. Stop right away if you feel sudden pain or your pain gets worse. Do not begin these exercises until told by your health care provider. Stretching and range-of-motion exercise This exercise warms up your muscles and joints and improves the movement and flexibility of your neck and shoulder. This exercise also helps to relieve pain and stiffness. Cervical side bend  1. Using good posture, sit on a stable chair, or stand up. 2. Without moving your shoulders, slowly tilt your left / right ear toward your left / right shoulder until you feel a stretch in your neck (cervical) muscles on the other side. You should be looking straight ahead. 3. Hold for ____10______ seconds. 4. Slowly return to the starting position. 5. Repeat the stretch on your left / right side. Repeat _____10_____ times. Complete this exercise _____3_____ times a day. Strengthening exercises These exercises build strength and endurance in your shoulder. Endurance is the ability to use your muscles for a long time, even after they get tired. Scapular protraction, supine  1. Lie on your back on a firm surface (supine position). Hold a ___2 lb_______ weight in your left / right  hand. 2. Raise your left / right arm straight into the air so your hand is directly above your shoulder joint. 3. Push the weight into the air so your shoulder (scapula) lifts off the surface that you are lying on. The scapula will push up or forward (protraction). Do not move your head, neck, or back. 4. Hold for ________10__ seconds. 5. Slowly return to the starting position. Let your muscles relax completely before you repeat this exercise. Repeat ______10____ times. Complete this exercise _____3____ times a day. Scapular retraction  1. Sit in a stable chair without armrests, or stand up. 2. Secure an exercise band to a stable object in front of you so the band is at shoulder height. 3. Hold one end of the exercise band in each hand. Your palms should face down. 4. Squeeze your shoulder blades (scapulae) together and move your elbows slightly behind you (retraction). Do not shrug your shoulders upward while you do this. 5. Hold for ____10______ seconds. 6. Slowly return to the starting position. Repeat ________10_ times. Complete this exercise ________3__ times a day. Shoulder extension with scapular retraction  1. Sit in a stable chair without armrests, or stand up. 2. Secure an exercise band to a stable object in front of you so the band is above shoulder height. 3. Hold one end of the exercise band in each hand. 4. Straighten your elbows and lift your hands up to shoulder height. 5. Squeeze your shoulder blades together (scapular retraction) and pull your hands down to the sides of your thighs (shoulder extension). Stop when your hands are  straight down by your sides. Do not let your hands go behind your body. 6. Hold for ____10______ seconds. 7. Slowly return to the starting position. Repeat ____10_____ times. Complete this exercise _____3_____ times a day. Shoulder abduction 1. Sit in a stable chair without armrests, or stand up. 2. If directed, hold a _____2 lb____ weight in your  left / right hand. 3. Start with your arms straight down. Turn your left / right hand so your palm faces in, toward your body. 4. Slowly lift your left / right hand out to your side (abduction). Do not lift your hand above shoulder height. ? Keep your arms straight. ? Avoid shrugging your shoulder while you do this movement. Keep your shoulder blade tucked down toward the middle of your back. 5. Hold for ___10_______ seconds. 6. Slowly lower your arm, and return to the starting position. Repeat ____10_____ times. Complete this exercise ___3______ times a day. This information is not intended to replace advice given to you by your health care provider. Make sure you discuss any questions you have with your health care provider. Document Revised: 06/28/2018 Document Reviewed: 04/11/2018 Elsevier Patient Education  2020 [employer].

## 2020-04-01 ENCOUNTER — Telehealth: Payer: Self-pay | Admitting: Emergency Medicine

## 2020-04-01 NOTE — Telephone Encounter (Signed)
PA started for Mycophenolate Mofetil on CMM.  KeyClaudie Leach - Rx #: [id number].  Awaiting determination form [employer].

## 2020-04-08 NOTE — Telephone Encounter (Signed)
Received fax from [facility] that [place] approved 04/01/20-04/01/21.

## 2020-04-14 ENCOUNTER — Ambulatory Visit (HOSPITAL_COMMUNITY): Payer: Medicare Other

## 2020-04-15 ENCOUNTER — Ambulatory Visit: Payer: Medicare Other | Admitting: Orthopedic Surgery

## 2020-04-20 ENCOUNTER — Other Ambulatory Visit: Payer: Self-pay | Admitting: Neurology

## 2020-04-27 ENCOUNTER — Other Ambulatory Visit: Payer: Self-pay | Admitting: Neurology

## 2020-04-27 ENCOUNTER — Other Ambulatory Visit: Payer: Self-pay | Admitting: Specialist

## 2020-04-28 ENCOUNTER — Other Ambulatory Visit (INDEPENDENT_AMBULATORY_CARE_PROVIDER_SITE_OTHER): Payer: Self-pay

## 2020-04-28 DIAGNOSIS — K219 Gastro-esophageal reflux disease without esophagitis: Secondary | ICD-10-CM

## 2020-04-28 MED ORDER — LANSOPRAZOLE 30 MG PO CPDR: 30.0000 mg | DELAYED_RELEASE_CAPSULE | Freq: Two times a day (BID) | ORAL | 3 refills | Status: DC

## 2020-05-04 ENCOUNTER — Ambulatory Visit (INDEPENDENT_AMBULATORY_CARE_PROVIDER_SITE_OTHER): Payer: Medicare Other

## 2020-05-04 DIAGNOSIS — R001 Bradycardia, unspecified: Secondary | ICD-10-CM

## 2020-05-04 LAB — CUP PACEART REMOTE DEVICE CHECK
Battery Remaining Longevity: 119 mo
Battery Voltage: 3.01 V
Brady Statistic AP VP Percent: 0.03 %
Brady Statistic AP VS Percent: 96.91 %
Brady Statistic AS VP Percent: 0 %
Brady Statistic AS VS Percent: 3.05 %
Brady Statistic RA Percent Paced: 97.06 %
Brady Statistic RV Percent Paced: 0.03 %
Date Time Interrogation Session: 20220215003623
Implantable Lead Implant Date: 20190501
Implantable Lead Implant Date: 20190501
Implantable Lead Location: 753859
Implantable Lead Location: 753860
Implantable Lead Model: 3830
Implantable Lead Model: 5076
Implantable Pulse Generator Implant Date: 20190501
Lead Channel Impedance Value: 304 Ohm
Lead Channel Impedance Value: 342 Ohm
Lead Channel Impedance Value: 342 Ohm
Lead Channel Impedance Value: 456 Ohm
Lead Channel Pacing Threshold Amplitude: 0.5 V
Lead Channel Pacing Threshold Amplitude: 0.625 V
Lead Channel Pacing Threshold Pulse Width: 0.4 ms
Lead Channel Pacing Threshold Pulse Width: 0.4 ms
Lead Channel Sensing Intrinsic Amplitude: 0.875 mV
Lead Channel Sensing Intrinsic Amplitude: 0.875 mV
Lead Channel Sensing Intrinsic Amplitude: 6.875 mV
Lead Channel Sensing Intrinsic Amplitude: 6.875 mV
Lead Channel Setting Pacing Amplitude: 2 V
Lead Channel Setting Pacing Amplitude: 2.5 V
Lead Channel Setting Pacing Pulse Width: 0.4 ms
Lead Channel Setting Sensing Sensitivity: 0.9 mV

## 2020-05-11 NOTE — Progress Notes (Signed)
Remote pacemaker transmission.   

## 2020-05-12 ENCOUNTER — Other Ambulatory Visit: Payer: Self-pay | Admitting: Specialist

## 2020-05-12 ENCOUNTER — Ambulatory Visit (HOSPITAL_COMMUNITY)
Admission: RE | Admit: 2020-05-12 | Discharge: 2020-05-12 | Disposition: A | Discharge status: Home / Self Care | Payer: Medicare Other | Source: Ambulatory Visit | Attending: Specialist | Admitting: Specialist

## 2020-05-12 ENCOUNTER — Other Ambulatory Visit: Payer: Self-pay

## 2020-05-12 ENCOUNTER — Encounter (HOSPITAL_COMMUNITY): Payer: Self-pay

## 2020-05-12 DIAGNOSIS — R29898 Other symptoms and signs involving the musculoskeletal system: Secondary | ICD-10-CM

## 2020-05-12 DIAGNOSIS — M75121 Complete rotator cuff tear or rupture of right shoulder, not specified as traumatic: Secondary | ICD-10-CM | POA: Diagnosis not present

## 2020-05-12 DIAGNOSIS — M6250 Muscle wasting and atrophy, not elsewhere classified, unspecified site: Secondary | ICD-10-CM | POA: Diagnosis not present

## 2020-05-12 DIAGNOSIS — M19211 Secondary osteoarthritis, right shoulder: Secondary | ICD-10-CM | POA: Diagnosis not present

## 2020-05-12 DIAGNOSIS — M19011 Primary osteoarthritis, right shoulder: Secondary | ICD-10-CM | POA: Diagnosis not present

## 2020-05-12 MED ORDER — IOHEXOL 180 MG/ML  SOLN
20.0000 mL | Freq: Once | INTRAMUSCULAR | Status: AC | PRN
  Administered 2020-05-12: 15 mL via INTRA_ARTICULAR

## 2020-05-12 MED ORDER — POVIDONE-IODINE 10 % EX SOLN
CUTANEOUS | Status: AC
  Administered 2020-05-12: 1
  Filled 2020-05-12: qty 15

## 2020-05-12 MED ORDER — SODIUM CHLORIDE (PF) 0.9 % IJ SOLN
INTRAMUSCULAR | Status: AC
  Administered 2020-05-12: 10 mL
  Filled 2020-05-12: qty 10

## 2020-05-12 MED ORDER — LIDOCAINE HCL (PF) 1 % IJ SOLN
INTRAMUSCULAR | Status: AC
  Administered 2020-05-12: 5 mL
  Filled 2020-05-12: qty 5

## 2020-05-12 NOTE — Procedures (Signed)
Preprocedure Dx: RT rotator cuff tear arthropathy Postprocedure Dx: RT rotator cuff tear arthropathy Procedure  Fluoroscopically guided RT joint injection for CT arthrogrpahy Radiologist:  Thornton Papas Anesthesia:  3 ml of 1% lidocaine Injectate:  10 ml of [5 ml sterile saline and 15 ml Omnipaque-180 Fluoro time:  51minutes 42 seconds EBL:   None Complications: None

## 2020-05-24 ENCOUNTER — Other Ambulatory Visit: Payer: Self-pay | Admitting: Internal Medicine

## 2020-05-24 NOTE — Telephone Encounter (Signed)
Prescription refill request for Eliquis received. Indication: afib Last office visit: [name] 08/22/2019 Scr: 1.08, 03/16/2020 Age: 80 yo  Weight: 82.3 kg   Pt is on the correct dose of Eliquis per dosing criteria, prescription refill sent for Eliquis 5mg  BID

## 2020-05-26 ENCOUNTER — Ambulatory Visit (INDEPENDENT_AMBULATORY_CARE_PROVIDER_SITE_OTHER): Payer: Medicare Other | Admitting: Orthopedic Surgery

## 2020-05-26 DIAGNOSIS — M1611 Unilateral primary osteoarthritis, right hip: Secondary | ICD-10-CM

## 2020-05-26 DIAGNOSIS — M1712 Unilateral primary osteoarthritis, left knee: Secondary | ICD-10-CM | POA: Diagnosis not present

## 2020-05-26 DIAGNOSIS — M1711 Unilateral primary osteoarthritis, right knee: Secondary | ICD-10-CM

## 2020-05-26 DIAGNOSIS — R29898 Other symptoms and signs involving the musculoskeletal system: Secondary | ICD-10-CM

## 2020-05-29 ENCOUNTER — Encounter: Payer: Self-pay | Admitting: Orthopedic Surgery

## 2020-05-29 NOTE — Progress Notes (Signed)
Office Visit Note   Patient: [name]           Date of Birth: [birth year]           MRN: [id number] Visit Date: 05/26/2020 Requested by: [name], MD [place],  [place] [address] PCP: [name], MD  Subjective: Chief Complaint  Patient presents with  . Right Shoulder - Pain    HPI: Patient presents for evaluation of right shoulder.  Describes chronic worsening pain.  She is severely right-hand dominant per her admission.  She ambulates with a walker which she uses for balance and not weightbearing.  The pain wakes her from sleep at night.  Takes oxycodone.  She is a retired Marine scientist.  She reports decreased function with the shoulder over the last 6 months.  Has a history of injections which have stopped working.  She takes oxycodone for her back which she has had no back surgery.  She does live alone.  She is on Eliquis.  She has a daughter who is with her today.              ROS: All systems reviewed are negative as they relate to the chief complaint within the history of present illness.  Patient denies  fevers or chills.   Assessment & Plan: Visit Diagnoses:  1. Unilateral primary osteoarthritis, left knee   2. Unilateral primary osteoarthritis, right knee   3. Unilateral primary osteoarthritis, right hip   4. Weakness of shoulder     Plan: Impression is severe right shoulder arthritis with pain and diminished function.  She has significant rotator cuff tear arthropathy.  We talked about reverse shoulder replacement today including the risk and benefits and the stress that is on the body at this point to go through that surgery.  Whether or not she can live with the amount of shoulder disability and pain she has is hard to say.  At this time patient and her daughter want to consider their options.  We will see them back in about 2 months for repeat clinical evaluation.  I think that reverse shoulder replacement could make a pretty big difference in her life  in terms of activities of daily living and functional improvement.  She would be at some risk of perioperative complication regarding stroke heart attack heart rhythm problem.  Will make final decision in 2 months about reverse shoulder replacement then.  Follow-Up Instructions: Return in about 8 weeks (around 07/21/2020).   Orders:  No orders of the defined types were placed in this encounter.  No orders of the defined types were placed in this encounter.     Procedures: No procedures performed   Clinical Data: No additional findings.  Objective: Vital Signs: There were no vitals taken for this visit.  Physical Exam:   Constitutional: Patient appears well-developed HEENT:  Head: Normocephalic Eyes:EOM are normal Neck: Normal range of motion Cardiovascular: Normal rate Pulmonary/chest: Effort normal Neurologic: Patient is alert Skin: Skin is warm Psychiatric: Patient has normal mood and affect    Ortho Exam: Ortho exam demonstrates pretty good cervical spine range of motion.  She has 5 out of 5 grip EPL FPL interosseous are/extension bicep triceps and deltoid strength.  Deltoid is functional.  Diminished rotator cuff strength infraspinatus supraspinatus and subscap testing.  Gweneth Dimitri has about 4 out of 5 strength compared to significantly weaker infraspinatus and supraspinatus.  She has active forward flexion and AB duction below 90 degrees.  Specialty Comments:  No specialty comments available.  Imaging: No results found.   PMFS History: Patient Active Problem List   Diagnosis Date Noted  . Malignant neoplasm of upper-outer quadrant of right breast in female, estrogen receptor positive ([facility]) 10/22/2019  . Atypical chest pain 09/23/2018  . Dilated bile duct 09/23/2018  . Sinus node dysfunction ([facility]) 07/18/2017  . Dyspnea on exertion 06/25/2017  . Paraganglioma, malignant ([facility]) 11/06/2015  . Hypertensive cardiovascular disease 05/25/2014  . Malnutrition of moderate  degree ([facility]) 04/28/2014  . Thrombocytopenia ([facility]) 04/28/2014  . UTI (urinary tract infection) 04/28/2014  . Physical deconditioning 04/27/2014  . Ocular myasthenia ([facility]) 04/27/2014  . Depression with anxiety 04/27/2014  . Chronic diastolic CHF (congestive heart failure) ([facility]) 04/27/2014  . Bradycardia   . Orthostatic hypotension   . AKI (acute kidney injury) ([facility]) 04/05/2014  . Palpitations 04/04/2014  . Paroxysmal atrial fibrillation ([place]) 04/04/2014  . Chest pain 04/01/2014  . Sinus bradycardia 04/01/2014  . Weight loss 06/10/2012  . Abdominal pain, bilateral lower quadrant 09/26/2011  . Hypertension 07/26/2011  . Hypothyroidism 07/26/2011  . Arthritis 07/26/2011  . High cholesterol 07/26/2011  . GERD (gastroesophageal reflux disease) 07/26/2011  . Rectal bleeding 07/26/2011  . IBS (irritable bowel syndrome) 07/26/2011   Past Medical History:  Diagnosis Date  . Anemia   . Breast cancer ([facility]) 09/2019   right breast   . Chronic back pain   . Chronic nausea   . Complication of anesthesia    hypotension, elevated heart rate and oxygenation desaturation  . Depression with anxiety   . Depression with anxiety   . Diverticulosis   . Dyspnea    due to myasthenia gravis  . Essential hypertension   . GERD (gastroesophageal reflux disease)   . History of pneumonia   . Hypothyroidism   . IBS (irritable colon syndrome)   . LVH (left ventricular hypertrophy)   . Migraines   . Mixed hyperlipidemia   . Obesity   . Ocular myasthenia gravis ([place])   . Osteoarthritis of knee   . PAF (paroxysmal atrial fibrillation) (H[facility])    chads2vasc score is at least 4  . Paraganglioma ([facility])    Resection 02/2012 (retroperitoneal)  . Paraganglioma, malignant ([facility]) 11/06/2015  . Presence of permanent cardiac pacemaker 07/18/2017  . Renal insufficiency   . Skin cancer     Family History  Problem Relation Age of Onset  . Inflammatory bowel disease Maternal Grandmother     Past Surgical  History:  Procedure Laterality Date  . ABDOMINAL HYSTERECTOMY    . ABDOMINAL HYSTERECTOMY    . CARDIAC CATHETERIZATION    . CHOLECYSTECTOMY    . COLONOSCOPY  08/24/2011   Procedure: COLONOSCOPY;  Surgeon: Rogene Houston, MD;  [facility];  Service: Endoscopy;  Laterality: N/A;  1200  . CT guided biopsy  01/16/12  . GIVENS CAPSULE STUDY  01/29/2012   Procedure: GIVENS CAPSULE STUDY;  Surgeon: Rogene Houston, MD;  [facility];  Service: Endoscopy;  Laterality: N/A;  730  . KNEE SURGERY    . LAPAROTOMY  03/01/2012   Procedure: EXPLORATORY LAPAROTOMY;  Surgeon: Earnstine Regal, MD;  [facility];  Service: General;  Laterality: N/A;  Exploratory Laparotomy ,Resection Retroperitoneal Mass  . MASTECTOMY, PARTIAL Right 10/28/2019   Procedure: RIGHT BREAST PARTIAL MASTECTOMY;  Surgeon: Armandina Gemma, MD;  Location: [facility];  Service: General;  Laterality: Right;  . NASAL SINUS SURGERY     30 yrs  ago  . PACEMAKER IMPLANT N/A 07/18/2017   Procedure: PACEMAKER IMPLANT;  Surgeon: Evans Lance, MD;  Location: [facility];  Service: Cardiovascular;  Laterality: N/A;  . RIGHT/LEFT HEART CATH AND CORONARY ANGIOGRAPHY N/A 11/18/2018   Procedure: RIGHT/LEFT HEART CATH AND CORONARY ANGIOGRAPHY;  Surgeon: Belva Crome, MD;  Location: [facility];  Service: Cardiovascular;  Laterality: N/A;  . TOTAL KNEE ARTHROPLASTY Bilateral    2005 and 2011   Social History   Occupational History  . Occupation: Retired Quarry manager  Tobacco Use  . Smoking status: Never Smoker  . Smokeless tobacco: Never Used  Vaping Use  . Vaping Use: Never used  Substance and Sexual Activity  . Alcohol use: No    Alcohol/week: 0.0 standard drinks  . Drug use: No  . Sexual activity: Not on file

## 2020-06-03 ENCOUNTER — Ambulatory Visit: Payer: Medicare Other | Admitting: Neurology

## 2020-06-09 DIAGNOSIS — I4891 Unspecified atrial fibrillation: Secondary | ICD-10-CM | POA: Diagnosis not present

## 2020-06-09 DIAGNOSIS — E039 Hypothyroidism, unspecified: Secondary | ICD-10-CM | POA: Diagnosis not present

## 2020-06-09 DIAGNOSIS — Z0000 Encounter for general adult medical examination without abnormal findings: Secondary | ICD-10-CM | POA: Diagnosis not present

## 2020-06-09 DIAGNOSIS — E559 Vitamin D deficiency, unspecified: Secondary | ICD-10-CM | POA: Diagnosis not present

## 2020-06-09 DIAGNOSIS — R7301 Impaired fasting glucose: Secondary | ICD-10-CM | POA: Diagnosis not present

## 2020-06-09 DIAGNOSIS — N1831 Chronic kidney disease, stage 3a: Secondary | ICD-10-CM | POA: Diagnosis not present

## 2020-06-14 ENCOUNTER — Encounter: Payer: Self-pay | Admitting: Hematology and Oncology

## 2020-06-14 DIAGNOSIS — Z853 Personal history of malignant neoplasm of breast: Secondary | ICD-10-CM | POA: Diagnosis not present

## 2020-06-14 DIAGNOSIS — Z9011 Acquired absence of right breast and nipple: Secondary | ICD-10-CM | POA: Diagnosis not present

## 2020-06-15 ENCOUNTER — Encounter: Payer: Self-pay | Admitting: Neurology

## 2020-06-15 ENCOUNTER — Ambulatory Visit: Payer: Medicare Other | Admitting: Neurology

## 2020-06-15 ENCOUNTER — Other Ambulatory Visit: Payer: Self-pay

## 2020-06-15 VITALS — BP 149/83 | HR 69 | Ht 63.0 in | Wt 182.0 lb

## 2020-06-15 DIAGNOSIS — G7000 Myasthenia gravis without (acute) exacerbation: Secondary | ICD-10-CM | POA: Diagnosis not present

## 2020-06-15 MED ORDER — PREDNISONE 5 MG PO TABS: 15.0000 mg | ORAL_TABLET | Freq: Every day | ORAL | 1 refills | Status: DC

## 2020-06-15 NOTE — Progress Notes (Signed)
Reason for visit: Myasthenia gravis  [name] is an 80 y.o. female  History of present illness:  [name] is an 80 year old right-handed white female with history of myasthenia gravis primarily with ocular features.  She has some issues with chronic fatigue and some shortness of breath, but she generally she will sat in the 90% range even when she feels short winded.  She has a lot of orthopedic issues with degeneration of her right shoulder and she has a right hip problem and a low back problem.  She is followed by orthopedic surgery for this.  She tries to exercise in the swimming pool, she has regained some of her mobility of her right shoulder.  She has visual impairment in the right eye with a cataract, she has 20/20 vision in the left eye.  She still however has some double vision.  She has a thyroid nodule, she may choke occasionally when she swallows a pill.  She uses a walker for ambulation.  She returns for an evaluation.  Past Medical History:  Diagnosis Date  . Anemia   . Atrial fibrillation ([facility])   . Breast cancer ([facility]) 09/2019   right breast   . Chronic back pain   . Chronic nausea   . Complication of anesthesia    hypotension, elevated heart rate and oxygenation desaturation  . Depression with anxiety   . Diverticulosis   . Dyspnea    due to myasthenia gravis  . Essential hypertension   . GERD (gastroesophageal reflux disease)   . History of pneumonia   . Hypothyroidism   . IBS (irritable colon syndrome)   . LVH (left ventricular hypertrophy)   . Migraines   . Mixed hyperlipidemia   . Obesity   . Ocular myasthenia gravis ([facility])   . Osteoarthritis of knee   . Pacemaker   . PAF (paroxysmal atrial fibrillation) (H[facility])    chads2vasc score is at least 4  . Paraganglioma ([facility])    Resection 02/2012 (retroperitoneal)  . Paraganglioma, malignant ([facility]) 11/06/2015  . Presence of permanent cardiac pacemaker 07/18/2017  . Renal insufficiency   . Skin cancer      Past Surgical History:  Procedure Laterality Date  . ABDOMINAL HYSTERECTOMY    . CARDIAC CATHETERIZATION    . CHOLECYSTECTOMY    . COLONOSCOPY  08/24/2011   Procedure: COLONOSCOPY;  Surgeon: Rogene Houston, MD;  [facility];  Service: Endoscopy;  Laterality: N/A;  1200  . CT guided biopsy  01/16/12  . GIVENS CAPSULE STUDY  01/29/2012   Procedure: GIVENS CAPSULE STUDY;  Surgeon: Rogene Houston, MD;  [facility];  Service: Endoscopy;  Laterality: N/A;  730  . KNEE SURGERY    . LAPAROTOMY  03/01/2012   Procedure: EXPLORATORY LAPAROTOMY;  Surgeon: Earnstine Regal, MD;  [facility];  Service: General;  Laterality: N/A;  Exploratory Laparotomy ,Resection Retroperitoneal Mass  . MASTECTOMY, PARTIAL Right 10/28/2019   Procedure: RIGHT BREAST PARTIAL MASTECTOMY;  Surgeon: Armandina Gemma, MD;  Location: [facility];  Service: General;  Laterality: Right;  . NASAL SINUS SURGERY     30 yrs ago  . PACEMAKER IMPLANT N/A 07/18/2017   Procedure: PACEMAKER IMPLANT;  Surgeon: Evans Lance, MD;  Location: [facility];  Service: Cardiovascular;  Laterality: N/A;  . RIGHT/LEFT HEART CATH AND CORONARY ANGIOGRAPHY N/A 11/18/2018   Procedure: RIGHT/LEFT HEART CATH AND CORONARY ANGIOGRAPHY;  Surgeon: Belva Crome, MD;  Location:  [facility];  Service: Cardiovascular;  Laterality: N/A;  . TOTAL KNEE ARTHROPLASTY Bilateral    2005 and 2011    Family History  Problem Relation Age of Onset  . Inflammatory bowel disease Maternal Grandmother     Social history:  reports that she has never smoked. She has never used smokeless tobacco. She reports that she does not drink alcohol and does not use drugs.    Allergies  Allergen Reactions  . Nitrofurantoin Macrocrystal Nausea And Vomiting  . Ampicillin Rash    Did it involve swelling of the face/tongue/throat, SOB, or low BP? No Did it involve sudden or severe rash/hives, skin peeling, or any reaction on  the inside of your mouth or nose? No Did you need to seek medical attention at a hospital or doctor's office? No When did it last happen?20 years ago If all above answers are "NO", may proceed with cephalosporin use.   Marland Kitchen Avocado Diarrhea and Nausea And Vomiting  . Biaxin [Clarithromycin] Rash  . Doxycycline Monohydrate Nausea And Vomiting  . Moxifloxacin     Contraindication myasthenia gravis  . Tetracyclines & Related Nausea And Vomiting    Medications:  Prior to Admission medications   Medication Sig Start Date End Date Taking? Authorizing Provider  ALPRAZolam Duanne Moron) 0.5 MG tablet Take 1 mg by mouth 2 (two) times daily. 08/04/19  Yes [provider]  anastrozole (ARIMIDEX) 1 MG tablet Take 1 tablet (1 mg total) by mouth daily. 11/04/19  Yes Nicholas Lose, MD  Artificial Tear Solution (SOOTHE XP OP) Place 1 drop into both eyes 4 (four) times daily.   Yes [provider]  atenolol (TENORMIN) 25 MG tablet TAKE (1) TABLET BY MOUTH ONCE DAILY. MAY TAKE AN ADDITIONAL TABLET IF SYSTOLIC OVER 948 OR SEVERE PALPITATIONS. 05/30/19  Yes Herminio Commons, MD  atorvastatin (LIPITOR) 80 MG tablet Take 80 mg by mouth daily.   Yes [provider]  B Complex-C (B-COMPLEX WITH VITAMIN C) tablet Take 1 tablet by mouth daily with supper.    Yes [provider]  cholecalciferol (VITAMIN D) 1000 units tablet Take 1,000 Units by mouth 2 (two) times a day.    Yes [provider]  Coenzyme Q10 (COQ10) 100 MG CAPS Take 100 mg by mouth daily.    Yes [provider]  diclofenac sodium (VOLTAREN) 1 % GEL Apply 4 g topically 4 (four) times daily as needed (for pain).  01/13/15  Yes [provider]  ELIQUIS 5 MG TABS tablet TAKE (1) TABLET BY MOUTH TWICE DAILY. 05/24/20  Yes Evans Lance, MD  gabapentin (NEURONTIN) 300 MG capsule TAKE (1) CAPSULE BY MOUTH TWICE DAILY. 04/28/20  Yes Jessy Oto, MD  lansoprazole (PREVACID) 30 MG capsule Take 1  capsule (30 mg total) by mouth 2 (two) times daily before a meal. TAKE (1) CAPSULE BY MOUTH TWICE DAILY. 04/28/20  Yes Rehman, Mechele Dawley, MD  levothyroxine (SYNTHROID) 112 MCG tablet Take 112 mcg by mouth daily before breakfast.   Yes [provider]  lidocaine (LIDODERM) 5 % Place 1-3 patches onto the skin daily as needed (for pain). Remove & Discard patch within 12 hours or as directed by MD for pain   Yes [provider]  mycophenolate (CELLCEPT) 500 MG tablet Take 1 tablet (500 mg total) by mouth 2 (two) times daily. 09/23/19  Yes Kathrynn Ducking, MD  oxyCODONE-acetaminophen (PERCOCET) 10-325 MG tablet Take 1 tablet by mouth every 6 (six) hours as needed (  pain).  05/14/18  Yes [provider]  predniSONE (DELTASONE) 5 MG tablet Take 2 tablets (10 mg total) by mouth daily with breakfast. 11/04/19  Yes Nicholas Lose, MD  pyridostigmine (MESTINON) 60 MG tablet Take 0.5 tablets (30 mg total) by mouth 4 (four) times daily. 12/01/19  Yes Kathrynn Ducking, MD  SUMAtriptan (IMITREX) 50 MG tablet Take 50 mg by mouth every 2 (two) hours as needed. For migraines   Yes [provider]  Terbinafine 1 % GEL Apply 1 application topically 2 (two) times daily as needed (toe nail fungus).    Yes [provider]  traZODone (DESYREL) 50 MG tablet TAKE (1) TABLET BY MOUTH AT BEDTIME. 04/21/20  Yes Kathrynn Ducking, MD  TURMERIC PO Take 1,000 mg by mouth daily with supper.    Yes [provider]    ROS:  Out of a complete 14 system review of symptoms, the patient complains only of the following symptoms, and all other reviewed systems are negative.  Weakness, fatigue Double vision Joint pain Walking difficulty  Blood pressure (!) 149/83, pulse 69, height 5\' 3"  (1.6 m), weight 182 lb (82.6 kg).  Physical Exam  General: The patient is alert and cooperative at the time of the examination.  Skin: 2+ edema below the knees is seen bilaterally.   Neurologic  Exam  Mental status: The patient is alert and oriented x 3 at the time of the examination. The patient has apparent normal recent and remote memory, with an apparently normal attention span and concentration ability.   Cranial nerves: Facial symmetry is present. Speech is normal, no aphasia or dysarthria is noted. Extraocular movements are full.  On primary gaze, the right eye is projecting medially.  No ptosis is seen.  Visual fields are full.  Motor: The patient has good strength in all 4 extremities.  Sensory examination: Soft touch sensation is symmetric on the face, arms, and legs.  Coordination: The patient has good finger-nose-finger and heel-to-shin bilaterally.  Gait and station: The patient walks with a walker.  Reflexes: Deep tendon reflexes are symmetric.   Assessment/Plan:  1.  Myasthenia gravis with ocular features  2.  Chronic fatigue  3.  Gait disorder  The patient has a lot of problems with arthritis with her low back, right hip and right shoulder.  The patient has some limitation of movement of her right arm with abduction.  The patient is still having some generalized fatigue, we discussed the possibility of going up to 15 mg daily of the prednisone but the patient is not sure that she wants to do this.  I have sent in a prescription.  The patient will remain on the CellCept, she just had blood work done 3 days ago with her primary care physician, the CBC and comprehensive metabolic profile she claims was normal.  Blood work would not be done today, she will follow up in 6 months.  Jill Alexanders MD 06/15/2020 12:44 PM  [facility] [address] [place], [place] [address]  Phone [phone] Fax [phone]

## 2020-06-16 ENCOUNTER — Other Ambulatory Visit: Payer: Self-pay | Admitting: Neurology

## 2020-06-26 ENCOUNTER — Other Ambulatory Visit: Payer: Self-pay | Admitting: Surgery

## 2020-06-26 DIAGNOSIS — Z853 Personal history of malignant neoplasm of breast: Secondary | ICD-10-CM

## 2020-06-26 DIAGNOSIS — Z9011 Acquired absence of right breast and nipple: Secondary | ICD-10-CM

## 2020-06-26 DIAGNOSIS — D0511 Intraductal carcinoma in situ of right breast: Secondary | ICD-10-CM

## 2020-06-28 DIAGNOSIS — C50911 Malignant neoplasm of unspecified site of right female breast: Secondary | ICD-10-CM | POA: Insufficient documentation

## 2020-06-29 DIAGNOSIS — Z1589 Genetic susceptibility to other disease: Secondary | ICD-10-CM | POA: Diagnosis not present

## 2020-06-29 DIAGNOSIS — N1831 Chronic kidney disease, stage 3a: Secondary | ICD-10-CM | POA: Diagnosis not present

## 2020-06-29 DIAGNOSIS — I4891 Unspecified atrial fibrillation: Secondary | ICD-10-CM | POA: Diagnosis not present

## 2020-06-29 DIAGNOSIS — C755 Malignant neoplasm of aortic body and other paraganglia: Secondary | ICD-10-CM | POA: Diagnosis not present

## 2020-06-29 DIAGNOSIS — C50911 Malignant neoplasm of unspecified site of right female breast: Secondary | ICD-10-CM | POA: Diagnosis not present

## 2020-06-29 DIAGNOSIS — G7000 Myasthenia gravis without (acute) exacerbation: Secondary | ICD-10-CM | POA: Diagnosis not present

## 2020-06-29 DIAGNOSIS — E039 Hypothyroidism, unspecified: Secondary | ICD-10-CM | POA: Diagnosis not present

## 2020-06-29 DIAGNOSIS — D696 Thrombocytopenia, unspecified: Secondary | ICD-10-CM | POA: Diagnosis not present

## 2020-07-01 DIAGNOSIS — H25041 Posterior subcapsular polar age-related cataract, right eye: Secondary | ICD-10-CM | POA: Diagnosis not present

## 2020-07-01 DIAGNOSIS — Z961 Presence of intraocular lens: Secondary | ICD-10-CM | POA: Diagnosis not present

## 2020-07-02 ENCOUNTER — Other Ambulatory Visit: Payer: Medicare Other

## 2020-07-02 ENCOUNTER — Other Ambulatory Visit: Payer: Self-pay

## 2020-07-02 ENCOUNTER — Ambulatory Visit
Admission: RE | Admit: 2020-07-02 | Discharge: 2020-07-02 | Disposition: A | Discharge status: Home / Self Care | Payer: Medicare Other | Source: Ambulatory Visit | Attending: Surgery | Admitting: Surgery

## 2020-07-02 ENCOUNTER — Ambulatory Visit: Payer: Medicare Other

## 2020-07-02 DIAGNOSIS — R922 Inconclusive mammogram: Secondary | ICD-10-CM | POA: Diagnosis not present

## 2020-07-02 DIAGNOSIS — Z853 Personal history of malignant neoplasm of breast: Secondary | ICD-10-CM | POA: Diagnosis not present

## 2020-07-02 DIAGNOSIS — D0511 Intraductal carcinoma in situ of right breast: Secondary | ICD-10-CM

## 2020-07-02 DIAGNOSIS — Z9011 Acquired absence of right breast and nipple: Secondary | ICD-10-CM

## 2020-07-07 ENCOUNTER — Other Ambulatory Visit: Payer: Self-pay | Admitting: Specialist

## 2020-07-17 ENCOUNTER — Other Ambulatory Visit: Payer: Self-pay | Admitting: Neurology

## 2020-07-17 ENCOUNTER — Other Ambulatory Visit: Payer: Self-pay | Admitting: Hematology and Oncology

## 2020-07-18 DIAGNOSIS — K219 Gastro-esophageal reflux disease without esophagitis: Secondary | ICD-10-CM | POA: Diagnosis not present

## 2020-07-18 DIAGNOSIS — I10 Essential (primary) hypertension: Secondary | ICD-10-CM | POA: Diagnosis not present

## 2020-07-23 ENCOUNTER — Encounter: Payer: Self-pay | Admitting: Orthopedic Surgery

## 2020-07-26 ENCOUNTER — Ambulatory Visit: Payer: Medicare Other | Admitting: Orthopedic Surgery

## 2020-07-27 ENCOUNTER — Other Ambulatory Visit: Payer: Self-pay | Admitting: Specialist

## 2020-08-03 ENCOUNTER — Ambulatory Visit (INDEPENDENT_AMBULATORY_CARE_PROVIDER_SITE_OTHER): Payer: Medicare Other

## 2020-08-03 DIAGNOSIS — I495 Sick sinus syndrome: Secondary | ICD-10-CM | POA: Diagnosis not present

## 2020-08-03 LAB — CUP PACEART REMOTE DEVICE CHECK
Battery Remaining Longevity: 116 mo
Battery Voltage: 3.01 V
Brady Statistic AP VP Percent: 0.04 %
Brady Statistic AP VS Percent: 96.96 %
Brady Statistic AS VP Percent: 0 %
Brady Statistic AS VS Percent: 3 %
Brady Statistic RA Percent Paced: 97.12 %
Brady Statistic RV Percent Paced: 0.04 %
Date Time Interrogation Session: 20220517012620
Implantable Lead Implant Date: 20190501
Implantable Lead Implant Date: 20190501
Implantable Lead Location: 753859
Implantable Lead Location: 753860
Implantable Lead Model: 3830
Implantable Lead Model: 5076
Implantable Pulse Generator Implant Date: 20190501
Lead Channel Impedance Value: 304 Ohm
Lead Channel Impedance Value: 342 Ohm
Lead Channel Impedance Value: 342 Ohm
Lead Channel Impedance Value: 475 Ohm
Lead Channel Pacing Threshold Amplitude: 0.625 V
Lead Channel Pacing Threshold Amplitude: 0.625 V
Lead Channel Pacing Threshold Pulse Width: 0.4 ms
Lead Channel Pacing Threshold Pulse Width: 0.4 ms
Lead Channel Sensing Intrinsic Amplitude: 1.125 mV
Lead Channel Sensing Intrinsic Amplitude: 1.125 mV
Lead Channel Sensing Intrinsic Amplitude: 6.875 mV
Lead Channel Sensing Intrinsic Amplitude: 6.875 mV
Lead Channel Setting Pacing Amplitude: 2 V
Lead Channel Setting Pacing Amplitude: 2.5 V
Lead Channel Setting Pacing Pulse Width: 0.4 ms
Lead Channel Setting Sensing Sensitivity: 0.9 mV

## 2020-08-08 NOTE — Assessment & Plan Note (Signed)
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 I discussed the margins. Lymph node evaluation was not done because of her age. We also discussed the final staging along with previously performed ER/PR and HER-2/neu testing.  Recommendation: Adjuvant antiestrogen therapy with anastrozole 1 mg daily x5 years  We discussed the risks and benefits of radiation and patient and her daughter both expressed that they do not want to receive radiation.  Anastrozole Toxicities:  Breast cancer Surveillance: 1. Breast Exam:08/09/20: Benign 2. Mammograms: scheduled for 09/07/20

## 2020-08-09 ENCOUNTER — Encounter: Payer: Self-pay | Admitting: *Deleted

## 2020-08-09 ENCOUNTER — Inpatient Hospital Stay: Payer: Medicare Other | Attending: Hematology and Oncology | Admitting: Hematology and Oncology

## 2020-08-09 ENCOUNTER — Other Ambulatory Visit: Payer: Self-pay

## 2020-08-09 VITALS — BP 133/59 | HR 73 | Temp 97.5°F | Resp 18 | Ht 63.0 in | Wt 176.6 lb

## 2020-08-09 DIAGNOSIS — Z79811 Long term (current) use of aromatase inhibitors: Secondary | ICD-10-CM | POA: Insufficient documentation

## 2020-08-09 DIAGNOSIS — L989 Disorder of the skin and subcutaneous tissue, unspecified: Secondary | ICD-10-CM

## 2020-08-09 DIAGNOSIS — Z79899 Other long term (current) drug therapy: Secondary | ICD-10-CM | POA: Insufficient documentation

## 2020-08-09 DIAGNOSIS — Z7952 Long term (current) use of systemic steroids: Secondary | ICD-10-CM | POA: Insufficient documentation

## 2020-08-09 DIAGNOSIS — Z7901 Long term (current) use of anticoagulants: Secondary | ICD-10-CM | POA: Insufficient documentation

## 2020-08-09 DIAGNOSIS — C50411 Malignant neoplasm of upper-outer quadrant of right female breast: Secondary | ICD-10-CM | POA: Insufficient documentation

## 2020-08-09 DIAGNOSIS — Z170 Estrogen receptor positive status [ER+]: Secondary | ICD-10-CM | POA: Insufficient documentation

## 2020-08-09 MED ORDER — ANASTROZOLE 1 MG PO TABS: 1.0000 mg | ORAL_TABLET | Freq: Every day | ORAL | 3 refills | Status: DC

## 2020-08-09 MED ORDER — LEVOTHYROXINE SODIUM 88 MCG PO TABS: 88.0000 ug | ORAL_TABLET | Freq: Every day | ORAL | Status: DC

## 2020-08-09 NOTE — Progress Notes (Signed)
Per MD request referral successfully faxed to [facility] dermatology [phone].

## 2020-08-09 NOTE — Progress Notes (Signed)
Patient Care Team: Patient, No Pcp Per (Inactive) as PCP - General (General Practice) Herminio Commons, MD (Inactive) as PCP - Cardiology (Cardiology) Setzer, Rona Ravens, NP (Inactive) as Nurse Practitioner (Internal Medicine) Nicholas Lose, MD as Consulting Physician (Hematology and Oncology) Armandina Gemma, MD as Consulting Physician (General Surgery)  DIAGNOSIS:    ICD-10-CM   1. Malignant neoplasm of upper-outer quadrant of right breast in female, estrogen receptor positive ([place])  C50.411    Z17.0     SUMMARY OF ONCOLOGIC HISTORY: Oncology History Overview Note  [place] Gene Abnormality Annual 24 hr urine collection for VMA's and catecholamines   Paraganglioma, malignant ([place])  01/01/2012 Imaging   CT abdomen/pelvis no acute findings identified within the abdomen/pelvis. Slight increase in size of enlarge and enhancing periaortic LN   01/10/2012 PET scan   Intensely hypermetabolic solitary L periaortic LN is concerning for a lymphoproliferative process vs a solitary metastasis. Diffuse intensely hypermetabolic thyroid activity is most consistent with a benign thyroiditis    01/16/2012 Initial Biopsy   IR CT guided biopsy of L para aortic lymph node   01/16/2012 Pathology Results   Low grade neoplasm with neuroendocrine differentiation. DDX includes paraganglioma, pheochromocytoma, and carcinoid   01/30/2012 Imaging   US soft tissue head/neck diffusely enlarged hypervascular nodular thyroid gland compatible with thyroiditis   03/01/2012 Surgery   Exploratory laparotomy, resection of retroperitoneal neuroendocrine tumor 3 x 2 x 2 cm with Dr. Armandina Gemma   03/01/2012 Pathology Results   Paraganglioma with associated fibrosis 2.5 cm, negative margins   04/17/2014 PET scan   There is no evidence for residual or recurrent hypermetabolic tumor or adenopathy. 2. Diffusely increased radiotracer uptake throughout both lobes of the thyroid gland. Correlation with patient's TSH is  recommended.    09/10/2015 Imaging   MRI neck with/without contrast at [facility]. No neck mass or LAD. Likely mildly asymmetric lingual tonsil tissue in the L vallecular. 1.3 cm likely calcified L thyroid nodule.    10/08/2015 Imaging   CT C/A/P No evidence for residual or recurrent tumor or adenopathy.   06/15/2016 Imaging   MRI brain- No acute abnormality.  Negative for metastatic disease  Several small subcortical white matter hyperintensities have developed since 2013. This is most likely related to chronic microvascular ischemia.   06/15/2016 Imaging   CT CAP-   1. No evidence of thoracic metastasis. 1. No evidence of metastatic disease in the abdomen pelvis. 2. No periaortic adenopathy. 3. Sigmoid diverticulosis.  No diverticulitis.   Malignant neoplasm of upper-outer quadrant of right breast in female, estrogen receptor positive ([place])  10/14/2019 Cancer Staging   Staging form: Breast, AJCC 8th Edition - Clinical stage from 10/14/2019: Stage IA (cT1b, cN0, cM0, G2, ER+, PR-, HER2-)   10/22/2019 Initial Diagnosis   Screening mammogram showed a right breast mass. Diagnostic mammogram and US showed a 1.0cm right breast mass at the 10 o'clock position, no right axillary adenopathy. Biopsy showed IDC with DCIS, grade 2, HER-2 equivocal by IHC, negative by FISH, ER+ 95%, PR- 0%, Ki67 2%.   10/28/2019 Surgery   Right lumpectomy ([place]) ([id number]): IDC, 1.4cm, grade 2,  with intermediate grade DCIS. Clear margins. No regional lymph nodes were examined. ER+, PR-, HER-2 -   10/28/2019 Cancer Staging   Staging form: Breast, AJCC 8th Edition - Pathologic stage from 10/28/2019: Stage IA (pT1c, pN0, cM0, G2, ER+, PR-, HER2-)   10/2019 - 10/2024 Anti-estrogen oral therapy   Anastrozole     CHIEF COMPLIANT: Follow-up of right  breast cancer on anastrozole   INTERVAL HISTORY: [name] is a 80 y.o. with above-mentioned history of right breast cancer who underwent a right lumpectomy and  is currently on antiestrogen therapy with anastrozole. Mammogram on 07/02/20 showed no evidence of malignancy bilaterally. She presents to the clinic today for follow-up.   ALLERGIES:  is allergic to nitrofurantoin macrocrystal, ampicillin, avocado, biaxin [clarithromycin], doxycycline monohydrate, moxifloxacin, and tetracyclines & related.  MEDICATIONS:  Current Outpatient Medications  Medication Sig Dispense Refill  . ALPRAZolam (XANAX) 0.5 MG tablet Take 1 mg by mouth 2 (two) times daily.    Marland Kitchen anastrozole (ARIMIDEX) 1 MG tablet TAKE (1) TABLET BY MOUTH ONCE DAILY. 90 tablet 0  . Artificial Tear Solution (SOOTHE XP OP) Place 1 drop into both eyes 4 (four) times daily.    Marland Kitchen atenolol (TENORMIN) 25 MG tablet TAKE (1) TABLET BY MOUTH ONCE DAILY. MAY TAKE AN ADDITIONAL TABLET IF SYSTOLIC OVER 621 OR SEVERE PALPITATIONS. 120 tablet 2  . atorvastatin (LIPITOR) 80 MG tablet Take 80 mg by mouth daily.    . B Complex-C (B-COMPLEX WITH VITAMIN C) tablet Take 1 tablet by mouth daily with supper.     . cholecalciferol (VITAMIN D) 1000 units tablet Take 1,000 Units by mouth 2 (two) times a day.     . Coenzyme Q10 (COQ10) 100 MG CAPS Take 100 mg by mouth daily.     . diclofenac sodium (VOLTAREN) 1 % GEL Apply 4 g topically 4 (four) times daily as needed (for pain).     Marland Kitchen ELIQUIS 5 MG TABS tablet TAKE (1) TABLET BY MOUTH TWICE DAILY. 60 tablet 5  . gabapentin (NEURONTIN) 300 MG capsule TAKE (1) CAPSULE BY MOUTH TWICE DAILY. 60 capsule 0  . lansoprazole (PREVACID) 30 MG capsule Take 1 capsule (30 mg total) by mouth 2 (two) times daily before a meal. TAKE (1) CAPSULE BY MOUTH TWICE DAILY. 180 capsule 3  . levothyroxine (SYNTHROID) 112 MCG tablet Take 112 mcg by mouth daily before breakfast.    . lidocaine (LIDODERM) 5 % Place 1-3 patches onto the skin daily as needed (for pain). Remove & Discard patch within 12 hours or as directed by MD for pain    . mycophenolate (CELLCEPT) 500 MG tablet Take 1 tablet (500 mg  total) by mouth 2 (two) times daily. 180 tablet 3  . oxyCODONE-acetaminophen (PERCOCET) 10-325 MG tablet Take 1 tablet by mouth every 6 (six) hours as needed (pain).     . predniSONE (DELTASONE) 5 MG tablet Take 3 tablets (15 mg total) by mouth daily with breakfast. 270 tablet 1  . pyridostigmine (MESTINON) 60 MG tablet TAKE (1/2) TABLET BY MOUTH FOUR TIMES DAILY. 180 tablet 2  . SUMAtriptan (IMITREX) 50 MG tablet Take 50 mg by mouth every 2 (two) hours as needed. For migraines    . Terbinafine 1 % GEL Apply 1 application topically 2 (two) times daily as needed (toe nail fungus).     . traZODone (DESYREL) 50 MG tablet TAKE (1) TABLET BY MOUTH AT BEDTIME. 90 tablet 2  . TURMERIC PO Take 1,000 mg by mouth daily with supper.      No current facility-administered medications for this visit.    PHYSICAL EXAMINATION: ECOG PERFORMANCE STATUS: 1 - Symptomatic but completely ambulatory  Vitals:   08/09/20 1407  BP: (!) 133/59  Pulse: 73  Resp: 18  Temp: (!) 97.5 F (36.4 C)  SpO2: 96%   Filed Weights   08/09/20 1407  Weight: 176  lb 9.6 oz (80.1 kg)    BREAST: No palpable masses or nodules in either right or left breasts. No palpable axillary supraclavicular or infraclavicular adenopathy no breast tenderness or nipple discharge. (exam performed in the presence of a chaperone)  LABORATORY DATA:  I have reviewed the data as listed CMP Latest Ref Rng & Units 03/16/2020 12/01/2019 05/22/2019  Glucose 65 - 99 mg/dL 109(H) 100(H) 95  BUN 8 - 27 mg/dL 16 15 18   Creatinine 0.57 - 1.00 mg/dL 1.08(H) 1.06(H) 1.15(H)  Sodium 134 - 144 mmol/L 138 142 142  Potassium 3.5 - 5.2 mmol/L 4.3 4.3 4.3  Chloride 96 - 106 mmol/L 101 105 106  CO2 20 - 29 mmol/L 25 23 21   Calcium 8.7 - 10.3 mg/dL 9.7 9.6 10.2  Total Protein 6.0 - 8.5 g/dL 6.2 6.2 7.0  Total Bilirubin 0.0 - 1.2 mg/dL 0.4 0.4 0.5  Alkaline Phos 44 - 121 IU/L 57 81 53  AST 0 - 40 IU/L 18 61(H) 17  ALT 0 - 32 IU/L 18 86(H) 9    Lab Results   Component Value Date   WBC 8.3 03/16/2020   HGB 12.9 03/16/2020   HCT 40.5 03/16/2020   MCV 99 (H) 03/16/2020   PLT 175 03/16/2020   NEUTROABS 6.5 03/16/2020    ASSESSMENT & PLAN:  Malignant neoplasm of upper-outer quadrant of right breast in female, estrogen receptor positive ([place]) Right lumpectomy: 10/28/2019: Grade 2 IDC 1.4 cm, intermediate grade DCIS, margins negative, suspicious for LVI, ER 95%, PR 0%, HER-2 negative, Ki-67 2% T1c Nx stage Ia  Recommendation: Adjuvant antiestrogen therapy with anastrozole 1 mg daily x5 years Did not want to do radiation.  Anastrozole Toxicities: Denies any adverse effects to anastrozole therapy. Chronic arthritis and rotator cuff tendinitis in the right shoulder  Breast cancer Surveillance: 1. Breast Exam:08/09/20: Benign 2. Mammograms: scheduled for 09/07/20  Possible skin cancer: I sent a referral to [facility].  Return to clinic in 1 year for follow-up  No orders of the defined types were placed in this encounter.  The patient has a good understanding of the overall plan. she agrees with it. she will call with any problems that may develop before the next visit here.  Total time spent: 20 mins including face to face time and time spent for planning, charting and coordination of care  Rulon Eisenmenger, MD, MPH 08/09/2020  I, Molly Dorshimer, am acting as scribe for Dr. Nicholas Lose.  I have reviewed the above documentation for accuracy and completeness, and I agree with the above.

## 2020-08-26 NOTE — Progress Notes (Signed)
Remote pacemaker transmission.   

## 2020-08-31 DIAGNOSIS — D2372 Other benign neoplasm of skin of left lower limb, including hip: Secondary | ICD-10-CM | POA: Diagnosis not present

## 2020-08-31 DIAGNOSIS — L821 Other seborrheic keratosis: Secondary | ICD-10-CM | POA: Diagnosis not present

## 2020-09-07 DIAGNOSIS — C755 Malignant neoplasm of aortic body and other paraganglia: Secondary | ICD-10-CM | POA: Diagnosis not present

## 2020-09-08 ENCOUNTER — Other Ambulatory Visit: Payer: Self-pay | Admitting: Specialist

## 2020-09-21 ENCOUNTER — Ambulatory Visit: Payer: Medicare Other | Admitting: Internal Medicine

## 2020-09-21 ENCOUNTER — Other Ambulatory Visit: Payer: Self-pay

## 2020-09-21 ENCOUNTER — Encounter: Payer: Self-pay | Admitting: Internal Medicine

## 2020-09-21 VITALS — BP 140/84 | HR 62 | Ht 63.0 in | Wt 175.2 lb

## 2020-09-21 DIAGNOSIS — I480 Paroxysmal atrial fibrillation: Secondary | ICD-10-CM

## 2020-09-21 DIAGNOSIS — Z950 Presence of cardiac pacemaker: Secondary | ICD-10-CM

## 2020-09-21 DIAGNOSIS — I495 Sick sinus syndrome: Secondary | ICD-10-CM

## 2020-09-21 NOTE — Progress Notes (Signed)
HPI [name] returns today for followup. She is s/p PPM insertion due to sinus node dysfunction. She has HTN. She has a h/o migraine headaches. Since her PPM insertion she has done well. She has undergone breast CA surgery.  Allergies  Allergen Reactions   Nitrofurantoin Macrocrystal Nausea And Vomiting   Ampicillin Rash    Did it involve swelling of the face/tongue/throat, SOB, or low BP? No Did it involve sudden or severe rash/hives, skin peeling, or any reaction on the inside of your mouth or nose? No Did you need to seek medical attention at a hospital or doctor's office? No When did it last happen? 20 years ago     If all above answers are "NO", may proceed with cephalosporin use.    Avocado Diarrhea and Nausea And Vomiting   Biaxin [Clarithromycin] Rash   Doxycycline Monohydrate Nausea And Vomiting   Moxifloxacin     Contraindication myasthenia gravis   Tetracyclines & Related Nausea And Vomiting     Current Outpatient Medications  Medication Sig Dispense Refill   ALPRAZolam (XANAX) 0.5 MG tablet Take 1 mg by mouth 2 (two) times daily.     anastrozole (ARIMIDEX) 1 MG tablet Take 1 tablet (1 mg total) by mouth daily. 90 tablet 3   Artificial Tear Solution (SOOTHE XP OP) Place 1 drop into both eyes 4 (four) times daily.     atenolol (TENORMIN) 25 MG tablet TAKE (1) TABLET BY MOUTH ONCE DAILY. MAY TAKE AN ADDITIONAL TABLET IF SYSTOLIC OVER 637 OR SEVERE PALPITATIONS. 120 tablet 2   atorvastatin (LIPITOR) 80 MG tablet Take 80 mg by mouth daily.     B Complex-C (B-COMPLEX WITH VITAMIN C) tablet Take 1 tablet by mouth daily with supper.      cholecalciferol (VITAMIN D) 1000 units tablet Take 1,000 Units by mouth 2 (two) times a day.      Coenzyme Q10 (COQ10) 100 MG CAPS Take 100 mg by mouth daily.      diclofenac sodium (VOLTAREN) 1 % GEL Apply 4 g topically 4 (four) times daily as needed (for pain).      docusate sodium (COLACE) 100 MG capsule Take 100 mg by mouth 2 (two)  times daily.     ELIQUIS 5 MG TABS tablet TAKE (1) TABLET BY MOUTH TWICE DAILY. 60 tablet 5   gabapentin (NEURONTIN) 300 MG capsule TAKE (1) CAPSULE BY MOUTH TWICE DAILY. 60 capsule 0   lansoprazole (PREVACID) 30 MG capsule Take 1 capsule (30 mg total) by mouth 2 (two) times daily before a meal. TAKE (1) CAPSULE BY MOUTH TWICE DAILY. 180 capsule 3   levothyroxine (SYNTHROID) 88 MCG tablet Take 1 tablet (88 mcg total) by mouth daily before breakfast.     lidocaine (LIDODERM) 5 % Place 1-3 patches onto the skin daily as needed (for pain). Remove & Discard patch within 12 hours or as directed by MD for pain     mycophenolate (CELLCEPT) 500 MG tablet Take 1 tablet (500 mg total) by mouth 2 (two) times daily. (Patient taking differently: Take 500 mg by mouth 4 (four) times daily. 0.5 tablet) 180 tablet 3   oxyCODONE-acetaminophen (PERCOCET) 10-325 MG tablet Take 1 tablet by mouth every 6 (six) hours as needed (pain).      predniSONE (DELTASONE) 5 MG tablet Take 3 tablets (15 mg total) by mouth daily with breakfast. (Patient taking differently: Take 10 mg by mouth daily with breakfast. 2 tablets) 270 tablet 1   pyridostigmine (  MESTINON) 60 MG tablet TAKE (1/2) TABLET BY MOUTH FOUR TIMES DAILY. 180 tablet 2   SUMAtriptan (IMITREX) 50 MG tablet Take 50 mg by mouth every 2 (two) hours as needed. For migraines     Terbinafine 1 % GEL Apply 1 application topically 2 (two) times daily as needed (toe nail fungus).      traZODone (DESYREL) 50 MG tablet TAKE (1) TABLET BY MOUTH AT BEDTIME. 90 tablet 2   TURMERIC PO Take 1,000 mg by mouth daily with supper.      No current facility-administered medications for this visit.     Past Medical History:  Diagnosis Date   Anemia    Atrial fibrillation ([place])    Breast cancer ([place]) 09/2019   right breast    Chronic back pain    Chronic nausea    Complication of anesthesia    hypotension, elevated heart rate and oxygenation desaturation   Depression with anxiety     Diverticulosis    Dyspnea    due to myasthenia gravis   Essential hypertension    GERD (gastroesophageal reflux disease)    History of pneumonia    Hypothyroidism    IBS (irritable colon syndrome)    LVH (left ventricular hypertrophy)    Migraines    Mixed hyperlipidemia    Obesity    Ocular myasthenia gravis (HCC)    Osteoarthritis of knee    Pacemaker    PAF (paroxysmal atrial fibrillation) (H[facility])    chads2vasc score is at least 4   Paraganglioma [facility])    Resection 02/2012 (retroperitoneal)   Paraganglioma, malignant ([place]) 11/06/2015   Presence of permanent cardiac pacemaker 07/18/2017   Renal insufficiency    Skin cancer     ROS:   All systems reviewed and negative except as noted in the HPI.   Past Surgical History:  Procedure Laterality Date   ABDOMINAL HYSTERECTOMY     BREAST BIOPSY Right 09/2019   BREAST LUMPECTOMY Right 10/28/2019   CARDIAC CATHETERIZATION     CHOLECYSTECTOMY     COLONOSCOPY  08/24/2011   Procedure: COLONOSCOPY;  Surgeon: Rogene Houston, MD;  Location[facility];  Service: Endoscopy;  Laterality: N/A;  1200   CT guided biopsy  01/16/12   GIVENS CAPSULE STUDY  01/29/2012   Procedure: GIVENS CAPSULE STUDY;  Surgeon: Rogene Houston, MD;  Location: [facility] ENDO [facility]ITE;  Service: Endoscopy;  Laterality: N/A;  [place]     LAPAROTOMY  03/01/2012   Procedure: EXPLORATORY LAPAROTOMY;  Surgeon: Earnstine Regal, MD;  Location: [facility] ORS;  Service: General;  Laterality: N/A;  Exploratory Laparotomy ,Resection Retroperitoneal Mass   MASTECTOMY, PARTIAL Right 10/28/2019   Procedure: RIGHT BREAST PARTIAL MASTECTOMY;  Surgeon: Armandina Gemma, MD;  Location: [place];  Service: General;  Laterality: Right;   NASAL SINUS SURGERY     30 yrs ago   PACEMAKER IMPLANT N/A 07/18/2017   Procedure: PACEMAKER IMPLANT;  Surgeon: Evans Lance, MD;  Location: [place] CV LAB;  Service: Cardiovascular;  Laterality: N/A;   RIGHT/LEFT HEART CATH AND  CORONARY ANGIOGRAPHY N/A 11/18/2018   Procedure: RIGHT/LEFT HEART CATH AND CORONARY ANGIOGRAPHY;  Surgeon: Belva Crome, MD;  Location: [place] CV LAB;  Service: Cardiovascular;  Laterality: N/A;   TOTAL KNEE ARTHROPLASTY Bilateral    2005 and 2011     Family History  Problem Relation Age of Onset   Inflammatory bowel disease Maternal Grandmother      Social  History   Socioeconomic History   Marital status: Widowed    Spouse name: Not on file   Number of children: Not on file   Years of education: Not on file   Highest education level: Not on file  Occupational History   Occupation: Retired CCU nurse  Tobacco Use   Smoking status: Never   Smokeless tobacco: Never  Vaping Use   Vaping Use: Never used  Substance and Sexual Activity   Alcohol use: No    Alcohol/week: 0.0 standard drinks   Drug use: No   Sexual activity: Not on file  Other Topics Concern   Not on file  Social History Narrative   Lives in [place] alone.  Retired Quarry manager.   Caffeine use:    Right handed   Social Determinants of Health   Financial Resource Strain: Not on file  Food Insecurity: Not on file  Transportation Needs: Not on file  Physical Activity: Not on file  Stress: Not on file  Social Connections: Not on file  Intimate Partner Violence: Not on file     BP 140/84   Pulse 62   Ht 5\' 3"  (1.6 m)   Wt 175 lb 3.2 oz (79.5 kg)   SpO2 98%   BMI 31.04 kg/m   Physical Exam:  Well appearing NAD HEENT: Unremarkable Neck:  No JVD, no thyromegally Lymphatics:  No adenopathy Back:  No CVA tenderness Lungs:  Clear HEART:  Regular rate rhythm, no murmurs, no rubs, no clicks Abd:  soft, positive bowel sounds, no organomegally, no rebound, no guarding Ext:  2 plus pulses, no edema, no cyanosis, no clubbing Skin:  No rashes no nodules Neuro:  CN II through XII intact, motor grossly intact  EKG - NSR with atrial pacing  DEVICE  Normal device function.  See PaceArt for  details.   Assess/Plan:  1. Sinus node dysfunction - she is asymptomatic, s/p PPM insertion. 2. PPM -her [employer] DDD PM is working normally. 3. HTN - her bp is fairly well controlled at home though tends to run a little high in doctors offices. 4. Coags - she has had no bleeding on eliquis.   Mikle [email or web address].D.

## 2020-09-21 NOTE — Addendum Note (Signed)
Addended by: Levonne Hubert on: 09/21/2020 03:02 PM   Modules accepted: Orders

## 2020-09-21 NOTE — Patient Instructions (Signed)
Medication Instructions:  Your physician recommends that you continue on your current medications as directed. Please refer to the Current Medication list given to you today.  *If you need a refill on your cardiac medications before your next appointment, please call your pharmacy*   Lab Work: NONE   If you have labs (blood work) drawn today and your tests are completely normal, you will receive your results only by: . MyChart Message (if you have MyChart) OR . A paper copy in the mail If you have any lab test that is abnormal or we need to change your treatment, we will call you to review the results.   Testing/Procedures: NONE    Follow-Up: At [facility], you and your health needs are our priority.  As part of our continuing mission to provide you with exceptional heart care, we have created designated Provider Care Teams.  These Care Teams include your primary Cardiologist (physician) and Advanced Practice Providers (APPs -  Physician Assistants and Nurse Practitioners) who all work together to provide you with the care you need, when you need it.  We recommend signing up for the patient portal called "MyChart".  Sign up information is provided on this After Visit Summary.  MyChart is used to connect with patients for Virtual Visits (Telemedicine).  Patients are able to view lab/test results, encounter notes, upcoming appointments, etc.  Non-urgent messages can be sent to your provider as well.   To learn more about what you can do with MyChart, go to [email or web address].    Your next appointment:   1 year(s)  The format for your next appointment:   In Person  Provider:   Gregg Taylor, MD   Other Instructions Thank you for choosing [facility]!    

## 2020-09-27 ENCOUNTER — Other Ambulatory Visit (HOSPITAL_COMMUNITY): Payer: Self-pay | Admitting: Oncology

## 2020-09-27 DIAGNOSIS — C755 Malignant neoplasm of aortic body and other paraganglia: Secondary | ICD-10-CM

## 2020-09-30 ENCOUNTER — Other Ambulatory Visit: Payer: Self-pay | Admitting: Neurology

## 2020-09-30 ENCOUNTER — Other Ambulatory Visit: Payer: Self-pay | Admitting: Specialist

## 2020-10-12 DIAGNOSIS — Z23 Encounter for immunization: Secondary | ICD-10-CM | POA: Diagnosis not present

## 2020-10-12 DIAGNOSIS — E039 Hypothyroidism, unspecified: Secondary | ICD-10-CM | POA: Diagnosis not present

## 2020-10-12 DIAGNOSIS — G894 Chronic pain syndrome: Secondary | ICD-10-CM | POA: Insufficient documentation

## 2020-10-12 DIAGNOSIS — N1831 Chronic kidney disease, stage 3a: Secondary | ICD-10-CM | POA: Diagnosis not present

## 2020-10-12 DIAGNOSIS — D84821 Immunodeficiency due to drugs: Secondary | ICD-10-CM | POA: Diagnosis not present

## 2020-10-12 DIAGNOSIS — I8393 Asymptomatic varicose veins of bilateral lower extremities: Secondary | ICD-10-CM | POA: Insufficient documentation

## 2020-10-12 DIAGNOSIS — E559 Vitamin D deficiency, unspecified: Secondary | ICD-10-CM | POA: Diagnosis not present

## 2020-10-12 DIAGNOSIS — E782 Mixed hyperlipidemia: Secondary | ICD-10-CM | POA: Diagnosis not present

## 2020-10-13 ENCOUNTER — Other Ambulatory Visit: Payer: Self-pay | Admitting: Cardiology

## 2020-10-26 ENCOUNTER — Ambulatory Visit (HOSPITAL_COMMUNITY)
Admission: RE | Admit: 2020-10-26 | Discharge: 2020-10-26 | Disposition: A | Discharge status: Home / Self Care | Payer: Medicare Other | Source: Ambulatory Visit | Attending: Oncology | Admitting: Oncology

## 2020-10-26 ENCOUNTER — Other Ambulatory Visit: Payer: Self-pay

## 2020-10-26 DIAGNOSIS — E049 Nontoxic goiter, unspecified: Secondary | ICD-10-CM | POA: Diagnosis not present

## 2020-10-26 DIAGNOSIS — R930 Abnormal findings on diagnostic imaging of skull and head, not elsewhere classified: Secondary | ICD-10-CM | POA: Diagnosis not present

## 2020-10-26 DIAGNOSIS — C755 Malignant neoplasm of aortic body and other paraganglia: Secondary | ICD-10-CM | POA: Diagnosis not present

## 2020-10-26 MED ORDER — GADOBUTROL 1 MMOL/ML IV SOLN
7.0000 mL | Freq: Once | INTRAVENOUS | Status: AC | PRN
  Administered 2020-10-26: 7 mL via INTRAVENOUS

## 2020-10-26 NOTE — Progress Notes (Signed)
Per order, Changed device settings for MRI to  DOO at 75 bpm  Will program device back to pre-MRI settings after completion of exam, and send transmission

## 2020-11-02 ENCOUNTER — Ambulatory Visit (INDEPENDENT_AMBULATORY_CARE_PROVIDER_SITE_OTHER): Payer: Medicare Other

## 2020-11-02 DIAGNOSIS — I495 Sick sinus syndrome: Secondary | ICD-10-CM | POA: Diagnosis not present

## 2020-11-02 LAB — CUP PACEART REMOTE DEVICE CHECK
Battery Remaining Longevity: 113 mo
Battery Voltage: 3 V
Brady Statistic AP VP Percent: 0.04 %
Brady Statistic AP VS Percent: 99.01 %
Brady Statistic AS VP Percent: 0 %
Brady Statistic AS VS Percent: 0.95 %
Brady Statistic RA Percent Paced: 99.19 %
Brady Statistic RV Percent Paced: 0.04 %
Date Time Interrogation Session: 20220816013828
Implantable Lead Implant Date: 20190501
Implantable Lead Implant Date: 20190501
Implantable Lead Location: 753859
Implantable Lead Location: 753860
Implantable Lead Model: 3830
Implantable Lead Model: 5076
Implantable Pulse Generator Implant Date: 20190501
Lead Channel Impedance Value: 304 Ohm
Lead Channel Impedance Value: 323 Ohm
Lead Channel Impedance Value: 342 Ohm
Lead Channel Impedance Value: 456 Ohm
Lead Channel Pacing Threshold Amplitude: 0.625 V
Lead Channel Pacing Threshold Amplitude: 0.75 V
Lead Channel Pacing Threshold Pulse Width: 0.4 ms
Lead Channel Pacing Threshold Pulse Width: 0.4 ms
Lead Channel Sensing Intrinsic Amplitude: 0.875 mV
Lead Channel Sensing Intrinsic Amplitude: 0.875 mV
Lead Channel Sensing Intrinsic Amplitude: 8 mV
Lead Channel Sensing Intrinsic Amplitude: 8 mV
Lead Channel Setting Pacing Amplitude: 2 V
Lead Channel Setting Pacing Amplitude: 2.5 V
Lead Channel Setting Pacing Pulse Width: 0.4 ms
Lead Channel Setting Sensing Sensitivity: 0.9 mV

## 2020-11-23 NOTE — Progress Notes (Signed)
Remote pacemaker transmission.   

## 2020-11-26 ENCOUNTER — Other Ambulatory Visit: Payer: Self-pay | Admitting: Physician Assistant

## 2020-11-26 ENCOUNTER — Other Ambulatory Visit: Payer: Self-pay | Admitting: Internal Medicine

## 2020-11-26 NOTE — Telephone Encounter (Signed)
Prescription refill request for Eliquis received. Indication:  Atrial fib Last office visit: 09/21/20  Beckie Salts MD Scr: 1.03 on 10/12/20 Age: 80 Weight: 79.5kg  Based on above findings Eliquis '5mg'$  twice daily is the appropriate dose.  Refill approved.

## 2020-11-29 ENCOUNTER — Telehealth: Payer: Self-pay | Admitting: Internal Medicine

## 2020-11-29 MED ORDER — ATENOLOL 25 MG PO TABS: ORAL_TABLET | ORAL | 1 refills | Status: DC

## 2020-11-29 NOTE — Telephone Encounter (Signed)
New message     *STAT* If patient is at the pharmacy, call can be transferred to refill team.   1. Which medications need to be refilled? (please list name of each medication and dose if known) atenolol (TENORMIN) 25 MG tablet  2. Which pharmacy/location (including street and city if local pharmacy) is medication to be sent to? [place]  3. Do they need a 30 day or 90 day supply? [place]

## 2020-11-29 NOTE — Telephone Encounter (Signed)
Medication refill request approved for Atenolol.

## 2020-12-28 ENCOUNTER — Encounter: Payer: Self-pay | Admitting: Neurology

## 2020-12-28 ENCOUNTER — Ambulatory Visit: Payer: Medicare Other | Admitting: Neurology

## 2020-12-28 VITALS — BP 143/83 | HR 64 | Ht 64.0 in | Wt 175.0 lb

## 2020-12-28 DIAGNOSIS — G7000 Myasthenia gravis without (acute) exacerbation: Secondary | ICD-10-CM

## 2020-12-28 DIAGNOSIS — Z5181 Encounter for therapeutic drug level monitoring: Secondary | ICD-10-CM | POA: Diagnosis not present

## 2020-12-28 MED ORDER — BACLOFEN 10 MG PO TABS: 5.0000 mg | ORAL_TABLET | Freq: Every evening | ORAL | 3 refills | Status: AC | PRN

## 2020-12-28 MED ORDER — PREDNISONE 1 MG PO TABS: 4.0000 mg | ORAL_TABLET | Freq: Every day | ORAL | 1 refills | Status: DC

## 2020-12-28 MED ORDER — GABAPENTIN 300 MG PO CAPS: 300.0000 mg | ORAL_CAPSULE | Freq: Three times a day (TID) | ORAL | Status: DC

## 2020-12-28 MED ORDER — PREDNISONE 5 MG PO TABS: 5.0000 mg | ORAL_TABLET | Freq: Every day | ORAL | 3 refills | Status: DC

## 2020-12-28 MED ORDER — PREDNISONE 5 MG PO TABS: 10.0000 mg | ORAL_TABLET | Freq: Every day | ORAL | Status: DC

## 2020-12-28 NOTE — Progress Notes (Signed)
Reason for visit: Myasthenia gravis  [name] is an 80 y.o. female  History of present illness:  [name] is an 80 year old right-handed white female with a history of myasthenia gravis primarily with ocular features.  The patient is on CellCept and prednisone, she currently is taking 10 mg of prednisone.  The patient has a history of malignant paraganglioma and prior history of breast cancer.  Since last seen, the patient believes that her general fatigue has improved, her shortness of breath has improved.  She is trying to stay active.  She does report some occasional cramps in her feet at night occurring on average twice a week.  The patient has a lot of orthopedic issues with the right hip and right shoulder.  The patient takes oxycodone for pain.  She is sleeping better with this pain medication.  Gabapentin has been increased to 300 mg 3 times daily.  She comes to the office today for an evaluation.  She remains on CellCept 500 mg twice daily.  She takes Mestinon, 1/2 tablet 4 times daily.  She has a significant cataract in the right eye with impaired vision, for this reason she has very little double vision.  Past Medical History:  Diagnosis Date   Anemia    Atrial fibrillation ([place])    Breast cancer ([place]) 09/2019   right breast    Chronic back pain    Chronic nausea    Complication of anesthesia    hypotension, elevated heart rate and oxygenation desaturation   Depression with anxiety    Diverticulosis    Dyspnea    due to myasthenia gravis   Essential hypertension    GERD (gastroesophageal reflux disease)    History of pneumonia    Hypothyroidism    IBS (irritable colon syndrome)    LVH (left ventricular hypertrophy)    Migraines    Mixed hyperlipidemia    Obesity    Ocular myasthenia gravis (HCC)    Osteoarthritis of knee    Pacemaker    PAF (paroxysmal atrial fibrillation) (HCC)    chads2vasc score is at least 4   Paraganglioma ([place])    Resection 02/2012  (retroperitoneal)   Paraganglioma, malignant ([place]) 11/06/2015   Presence of permanent cardiac pacemaker 07/18/2017   Renal insufficiency    Skin cancer     Past Surgical History:  Procedure Laterality Date   ABDOMINAL HYSTERECTOMY     BREAST BIOPSY Right 09/2019   BREAST LUMPECTOMY Right 10/28/2019   CARDIAC CATHETERIZATION     CHOLECYSTECTOMY     COLONOSCOPY  08/24/2011   Procedure: COLONOSCOPY;  Surgeon: Rogene Houston, MD;  [facility];  Service: Endoscopy;  Laterality: N/A;  1200   CT guided biopsy  01/16/12   GIVENS CAPSULE STUDY  01/29/2012   Procedure: GIVENS CAPSULE STUDY;  Surgeon: Rogene Houston, MD;  Location: [facility];  Service: Endoscopy;  Laterality: N/A;  [place]     LAPAROTOMY  03/01/2012   Procedure: EXPLORATORY LAPAROTOMY;  Surgeon: Earnstine Regal, MD;  Location: [facility];  Service: General;  Laterality: N/A;  Exploratory Laparotomy ,Resection Retroperitoneal Mass   MASTECTOMY, PARTIAL Right 10/28/2019   Procedure: RIGHT BREAST PARTIAL MASTECTOMY;  Surgeon: Armandina Gemma, MD;  Location: [facility];  Service: General;  Laterality: Right;   NASAL SINUS SURGERY     30 yrs ago   PACEMAKER IMPLANT N/A 07/18/2017   Procedure: PACEMAKER IMPLANT;  Surgeon: Cristopher Peru  W, MD;  Location: [facility];  Service: Cardiovascular;  Laterality: N/A;   RIGHT/LEFT HEART CATH AND CORONARY ANGIOGRAPHY N/A 11/18/2018   Procedure: RIGHT/LEFT HEART CATH AND CORONARY ANGIOGRAPHY;  Surgeon: Belva Crome, MD;  Location: [facility];  Service: Cardiovascular;  Laterality: N/A;   TOTAL KNEE ARTHROPLASTY Bilateral    2005 and 2011    Family History  Problem Relation Age of Onset   Inflammatory bowel disease Maternal Grandmother     Social history:  reports that she has never smoked. She has never used smokeless tobacco. She reports that she does not drink alcohol and does not use drugs.    Allergies  Allergen Reactions    Nitrofurantoin Macrocrystal Nausea And Vomiting   Ampicillin Rash    Did it involve swelling of the face/tongue/throat, SOB, or low BP? No Did it involve sudden or severe rash/hives, skin peeling, or any reaction on the inside of your mouth or nose? No Did you need to seek medical attention at a hospital or doctor's office? No When did it last happen? 20 years ago     If all above answers are "NO", may proceed with cephalosporin use.    Avocado Diarrhea and Nausea And Vomiting   Biaxin [Clarithromycin] Rash   Doxycycline Monohydrate Nausea And Vomiting   Moxifloxacin     Contraindication myasthenia gravis   Tetracyclines & Related Nausea And Vomiting    Medications:  Prior to Admission medications   Medication Sig Start Date End Date Taking? Authorizing Provider  ALPRAZolam Duanne Moron) 0.5 MG tablet Take 1 mg by mouth 2 (two) times daily. 08/04/19   [provider]  anastrozole (ARIMIDEX) 1 MG tablet Take 1 tablet (1 mg total) by mouth daily. 08/09/20   Nicholas Lose, MD  apixaban (ELIQUIS) 5 MG TABS tablet TAKE (1) TABLET BY MOUTH TWICE DAILY. 11/26/20   Evans Lance, MD  Artificial Tear Solution (SOOTHE XP OP) Place 1 drop into both eyes 4 (four) times daily.    [provider]  atenolol (TENORMIN) 25 MG tablet TAKE (1) TABLET BY MOUTH ONCE DAILY. MAY TAKE AN ADDITIONAL TABLET IF SYSTOLIC OVER 102 OR SEVERE PALPITATIONS. 11/29/20   Imogene Burn, PA-C  atorvastatin (LIPITOR) 80 MG tablet Take 80 mg by mouth daily.    [provider]  B Complex-C (B-COMPLEX WITH VITAMIN C) tablet Take 1 tablet by mouth daily with supper.     [provider]  cholecalciferol (VITAMIN D) 1000 units tablet Take 1,000 Units by mouth 2 (two) times a day.     [provider]  Coenzyme Q10 (COQ10) 100 MG CAPS Take 100 mg by mouth daily.     [provider]  diclofenac sodium (VOLTAREN) 1 % GEL Apply 4 g topically 4 (four) times daily as needed (for pain).   01/13/15   [provider]  docusate sodium (COLACE) 100 MG capsule Take 100 mg by mouth 2 (two) times daily.    [provider]  gabapentin (NEURONTIN) 300 MG capsule TAKE (1) CAPSULE BY MOUTH TWICE DAILY. 09/30/20   Jessy Oto, MD  lansoprazole (PREVACID) 30 MG capsule Take 1 capsule (30 mg total) by mouth 2 (two) times daily before a meal. TAKE (1) CAPSULE BY MOUTH TWICE DAILY. 04/28/20   Rogene Houston, MD  levothyroxine (SYNTHROID) 88 MCG tablet Take 1 tablet (88 mcg total) by mouth daily before breakfast. 08/09/20   Nicholas Lose, MD  lidocaine (LIDODERM) 5 % Place  1-3 patches onto the skin daily as needed (for pain). Remove & Discard patch within 12 hours or as directed by MD for pain    [provider]  mycophenolate (CELLCEPT) 500 MG tablet TAKE (1) TABLET BY MOUTH TWICE DAILY. 09/30/20   Kathrynn Ducking, MD  oxyCODONE-acetaminophen (PERCOCET) 10-325 MG tablet Take 1 tablet by mouth every 6 (six) hours as needed (pain).  05/14/18   [provider]  predniSONE (DELTASONE) 5 MG tablet Take 3 tablets (15 mg total) by mouth daily with breakfast. Patient taking differently: Take 10 mg by mouth daily with breakfast. 2 tablets 06/15/20   Kathrynn Ducking, MD  pyridostigmine (MESTINON) 60 MG tablet TAKE (1/2) TABLET BY MOUTH FOUR TIMES DAILY. 07/19/20   Kathrynn Ducking, MD  SUMAtriptan (IMITREX) 50 MG tablet Take 50 mg by mouth every 2 (two) hours as needed. For migraines    [provider]  Terbinafine 1 % GEL Apply 1 application topically 2 (two) times daily as needed (toe nail fungus).     [provider]  traZODone (DESYREL) 50 MG tablet TAKE (1) TABLET BY MOUTH AT BEDTIME. 07/19/20   Kathrynn Ducking, MD  TURMERIC PO Take 1,000 mg by mouth daily with supper.     [provider]    ROS:  Out of a complete 14 system review of symptoms, the patient complains only of the following symptoms, and all other reviewed systems are  negative.  Double vision Fatigue Joint pains  Blood pressure (!) 143/83, pulse 64, height 5\' 4"  (1.626 m), weight 175 lb (79.4 kg).  Physical Exam  General: The patient is alert and cooperative at the time of the examination.  Skin: No significant peripheral edema is noted.   Neurologic Exam  Mental status: The patient is alert and oriented x 3 at the time of the examination. The patient has apparent normal recent and remote memory, with an apparently normal attention span and concentration ability.   Cranial nerves: Facial symmetry is present. Speech is normal, no aphasia or dysarthria is noted. Extraocular movements are full. Visual fields are full.  Motor: The patient has good strength in all 4 extremities, with exception of some giveaway type weakness with hip flexion on the right.  Sensory examination: Soft touch sensation is symmetric on the face, arms, and legs.  Coordination: The patient has good finger-nose-finger and heel-to-shin bilaterally.  Gait and station: The patient has a slightly wide-based gait.  The patient has unsteadiness with tandem gait. Romberg is negative. No drift is seen.  Reflexes: Deep tendon reflexes are symmetric.   Assessment/Plan:  1.  Ocular myasthenia gravis  The patient is reporting some nocturnal cramps in the feet.  I will give her a small prescription for low-dose baclofen to take 5 mg in the evening.  We will continue a prednisone taper, she will go down by 1 mg increments every 2 months as long she tolerates the taper.  She will follow-up here in 6 months.  She will have blood work done today.  I have indicated that the CellCept may put her at high risk for skin cancer, she will be seeing a dermatologist soon.  In the future, she can be seen through Dr. Krista Blue.  Jill Alexanders MD 12/28/2020 11:40 AM  [facility] [address] [place], [place] [address]  Phone [phone] Fax [phone]

## 2020-12-29 LAB — CBC WITH DIFFERENTIAL/PLATELET
Basophils Absolute: 0 10*3/uL (ref 0.0–0.2)
Basos: 1 %
EOS (ABSOLUTE): 0.1 10*3/uL (ref 0.0–0.4)
Eos: 1 %
Hematocrit: 40.9 % (ref 34.0–46.6)
Hemoglobin: 13.5 g/dL (ref 11.1–15.9)
Immature Grans (Abs): 0 10*3/uL (ref 0.0–0.1)
Immature Granulocytes: 0 %
Lymphocytes Absolute: 2.6 10*3/uL (ref 0.7–3.1)
Lymphs: 31 %
MCH: 32.5 pg (ref 26.6–33.0)
MCHC: 33 g/dL (ref 31.5–35.7)
MCV: 99 fL — ABNORMAL HIGH (ref 79–97)
Monocytes Absolute: 0.6 10*3/uL (ref 0.1–0.9)
Monocytes: 8 %
Neutrophils Absolute: 5 10*3/uL (ref 1.4–7.0)
Neutrophils: 59 %
Platelets: 164 10*3/uL (ref 150–450)
RBC: 4.15 x10E6/uL (ref 3.77–5.28)
RDW: 12.3 % (ref 11.7–15.4)
WBC: 8.4 10*3/uL (ref 3.4–10.8)

## 2020-12-29 LAB — COMPREHENSIVE METABOLIC PANEL
ALT: 16 IU/L (ref 0–32)
AST: 22 IU/L (ref 0–40)
Albumin/Globulin Ratio: 2 (ref 1.2–2.2)
Albumin: 4.2 g/dL (ref 3.6–4.6)
Alkaline Phosphatase: 50 IU/L (ref 44–121)
BUN/Creatinine Ratio: 15 (ref 12–28)
BUN: 18 mg/dL (ref 8–27)
Bilirubin Total: 0.4 mg/dL (ref 0.0–1.2)
CO2: 26 mmol/L (ref 20–29)
Calcium: 10.3 mg/dL (ref 8.7–10.3)
Chloride: 103 mmol/L (ref 96–106)
Creatinine, Ser: 1.2 mg/dL — ABNORMAL HIGH (ref 0.57–1.00)
Globulin, Total: 2.1 g/dL (ref 1.5–4.5)
Glucose: 90 mg/dL (ref 70–99)
Potassium: 4.5 mmol/L (ref 3.5–5.2)
Sodium: 142 mmol/L (ref 134–144)
Total Protein: 6.3 g/dL (ref 6.0–8.5)
eGFR: 45 mL/min/{1.73_m2} — ABNORMAL LOW (ref >59)

## 2021-01-06 DIAGNOSIS — D225 Melanocytic nevi of trunk: Secondary | ICD-10-CM | POA: Diagnosis not present

## 2021-01-06 DIAGNOSIS — L853 Xerosis cutis: Secondary | ICD-10-CM | POA: Diagnosis not present

## 2021-01-06 DIAGNOSIS — L821 Other seborrheic keratosis: Secondary | ICD-10-CM | POA: Diagnosis not present

## 2021-01-06 DIAGNOSIS — D224 Melanocytic nevi of scalp and neck: Secondary | ICD-10-CM | POA: Diagnosis not present

## 2021-01-25 ENCOUNTER — Ambulatory Visit (INDEPENDENT_AMBULATORY_CARE_PROVIDER_SITE_OTHER): Payer: Medicare Other | Admitting: Internal Medicine

## 2021-01-25 ENCOUNTER — Encounter (INDEPENDENT_AMBULATORY_CARE_PROVIDER_SITE_OTHER): Payer: Self-pay

## 2021-01-25 ENCOUNTER — Encounter (INDEPENDENT_AMBULATORY_CARE_PROVIDER_SITE_OTHER): Payer: Self-pay | Admitting: Internal Medicine

## 2021-01-25 ENCOUNTER — Other Ambulatory Visit: Payer: Self-pay

## 2021-01-25 VITALS — BP 128/72 | HR 66 | Temp 99.4°F | Ht 64.0 in | Wt 168.0 lb

## 2021-01-25 DIAGNOSIS — R198 Other specified symptoms and signs involving the digestive system and abdomen: Secondary | ICD-10-CM | POA: Insufficient documentation

## 2021-01-25 DIAGNOSIS — R634 Abnormal weight loss: Secondary | ICD-10-CM | POA: Diagnosis not present

## 2021-01-25 DIAGNOSIS — K219 Gastro-esophageal reflux disease without esophagitis: Secondary | ICD-10-CM | POA: Diagnosis not present

## 2021-01-25 MED ORDER — POLYETHYLENE GLYCOL 3350 17 GM/SCOOP PO POWD: 8.5000 g | Freq: Every day | ORAL | Status: AC

## 2021-01-25 MED ORDER — LANSOPRAZOLE 30 MG PO CPDR: 30.0000 mg | DELAYED_RELEASE_CAPSULE | Freq: Every day | ORAL | 3 refills | Status: DC

## 2021-01-25 NOTE — Progress Notes (Signed)
Presenting complaint;  Follow for chronic GERD. Patient complains of having difficulty with defecation.  Database and subjective:  Patient is 80 year old Caucasian female with multiple medical problems including myasthenia gravis as well as history of retroperitoneal paraganglioma which were resected back in December 2013 and she has remained in remission.  She has chronic GERD and history of IBS.  She was diagnosed with right breast carcinoma last year and she remains in remission.  She says heartburn is well controlled with lansoprazole 30 mg daily.  She did not have a relapse when she dropped the dose to once a day.  She denies dysphagia nausea or vomiting.  She does not have good appetite and she also states she also has lost sense of taste and smell.  She has lost 7 pounds since her last visit.  However she has lost 25 pounds in the last 2 years.  She eats healthy.  She stays busy.  She does housework herself and she also does do yard work. She is complaining of having difficulty with bowel movement.  She says stool comes on the rectum and she is not able to expel it.  She has 3-4 bowel movements per day.  Stool is formed and not necessarily hard.  She feels she is getting plenty of fiber in her diet.  She states she had anterior and posterior repair several years ago.  She has not had pelvic exam recently.  She denies melena or rectal bleeding. She has chronic pain which radiates into her right hip and right leg.  She has to take pain medication every day. She has been tapering prednisone dose slowly.  Current Medications: Outpatient Encounter Medications as of 01/25/2021  Medication Sig   ALPRAZolam (XANAX) 0.5 MG tablet Take 1 mg by mouth 2 (two) times daily.   anastrozole (ARIMIDEX) 1 MG tablet Take 1 tablet (1 mg total) by mouth daily.   apixaban (ELIQUIS) 5 MG TABS tablet TAKE (1) TABLET BY MOUTH TWICE DAILY.   Artificial Tear Solution (SOOTHE XP OP) Place 1 drop into both eyes 4 (four)  times daily.   atenolol (TENORMIN) 25 MG tablet TAKE (1) TABLET BY MOUTH ONCE DAILY. MAY TAKE AN ADDITIONAL TABLET IF SYSTOLIC OVER 494 OR SEVERE PALPITATIONS.   atorvastatin (LIPITOR) 80 MG tablet Take 80 mg by mouth daily.   B Complex-C (B-COMPLEX WITH VITAMIN C) tablet Take 1 tablet by mouth daily with supper.    baclofen (LIORESAL) 10 MG tablet Take 0.5 tablets (5 mg total) by mouth at bedtime as needed for muscle spasms.   Coenzyme Q10 (COQ10) 100 MG CAPS Take 100 mg by mouth daily.    diclofenac sodium (VOLTAREN) 1 % GEL Apply 4 g topically 4 (four) times daily as needed (for pain).    docusate sodium (COLACE) 100 MG capsule Take 100 mg by mouth 2 (two) times daily.   gabapentin (NEURONTIN) 300 MG capsule Take 1 capsule (300 mg total) by mouth 3 (three) times daily.   lansoprazole (PREVACID) 30 MG capsule Take 1 capsule (30 mg total) by mouth 2 (two) times daily before a meal. TAKE (1) CAPSULE BY MOUTH TWICE DAILY.   levothyroxine (SYNTHROID) 88 MCG tablet Take 1 tablet (88 mcg total) by mouth daily before breakfast.   lidocaine (LIDODERM) 5 % Place 1-3 patches onto the skin daily as needed (for pain). Remove & Discard patch within 12 hours or as directed by MD for pain   mycophenolate (CELLCEPT) 500 MG tablet TAKE (1) TABLET BY MOUTH  TWICE DAILY.   OVER THE COUNTER MEDICATION Calcium and vit D one bid   oxyCODONE-acetaminophen (PERCOCET) 10-325 MG tablet Take 1 tablet by mouth every 4 (four) hours as needed (pain).   predniSONE (DELTASONE) 1 MG tablet Take 4 tablets (4 mg total) by mouth daily with breakfast.   predniSONE (DELTASONE) 5 MG tablet Take 1 tablet (5 mg total) by mouth daily with breakfast. 2 tablets   pyridostigmine (MESTINON) 60 MG tablet TAKE (1/2) TABLET BY MOUTH FOUR TIMES DAILY.   SUMAtriptan (IMITREX) 50 MG tablet Take 50 mg by mouth every 2 (two) hours as needed. For migraines   Terbinafine 1 % GEL Apply 1 application topically 2 (two) times daily as needed (toe nail  fungus).    traZODone (DESYREL) 50 MG tablet TAKE (1) TABLET BY MOUTH AT BEDTIME.   [DISCONTINUED] TURMERIC PO Take 1,000 mg by mouth daily with supper.    [DISCONTINUED] cholecalciferol (VITAMIN D) 1000 units tablet Take 1,000 Units by mouth 2 (two) times a day.    No facility-administered encounter medications on file as of 01/25/2021.     Objective: Blood pressure 128/72, pulse 66, temperature 99.4 F (37.4 C), temperature source Oral, height _0  (1.626 m), weight 168 lb (76.2 kg). Patient is alert and in no acute distress. Conjunctiva is pink. Sclera is nonicteric Oropharyngeal mucosa is normal. No neck masses or thyromegaly noted. Cardiac exam with regular rhythm normal S1 and S2. No murmur or gallop noted. Lungs are clear to auscultation. Abdomen abdomen is symmetrical.  Low midline scar noted on palpation is soft and nontender with organomegaly or masses. No LE edema or clubbing noted.  Labs/studies Results:   CBC Latest Ref Rng & Units 12/28/2020 03/16/2020 12/01/2019  WBC 3.4 - 10.8 x10E3/uL 8.4 8.3 12.4(H)  Hemoglobin 11.1 - 15.9 g/dL 13.5 12.9 13.9  Hematocrit 34.0 - 46.6 % 40.9 40.5 41.7  Platelets 150 - 450 x10E3/uL 164 175 157    CMP Latest Ref Rng & Units 12/28/2020 03/16/2020 12/01/2019  Glucose 70 - 99 mg/dL 90 109(H) 100(H)  BUN 8 - 27 mg/dL _1 Creatinine 0.57 - 1.00 mg/dL 1.20(H) 1.08(H) 1.06(H)  Sodium 134 - 144 mmol/L 142 138 142  Potassium 3.5 - 5.2 mmol/L 4.5 4.3 4.3  Chloride 96 - 106 mmol/L 103 101 105  CO2 20 - 29 mmol/L _2 Calcium 8.7 - 10.3 mg/dL 10.3 9.7 9.6  Total Protein 6.0 - 8.5 g/dL 6.3 6.2 6.2  Total Bilirubin 0.0 - 1.2 mg/dL 0.4 0.4 0.4  Alkaline Phos 44 - 121 IU/L 50 57 81  AST 0 - 40 IU/L 22 18 61(H)  ALT 0 - 32 IU/L 16 18 86(H)    Hepatic Function Latest Ref Rng & Units 12/28/2020 03/16/2020 12/01/2019  Total Protein 6.0 - 8.5 g/dL 6.3 6.2 6.2  Albumin 3.6 - 4.6 g/dL 4.2 4.1 4.0  AST 0 - 40 IU/L 22 18 61(H)  ALT 0 -  32 IU/L 16 18 86(H)  Alk Phosphatase 44 - 121 IU/L 50 57 81  Total Bilirubin 0.0 - 1.2 mg/dL 0.4 0.4 0.4  Bilirubin, Direct 0.0 - 0.2 mg/dL - - -      Assessment:  #1.  Chronic GERD.  She is doing well with therapy.  She tolerated PPI dose reduction to once a day.  #2.  Defecation disorder.  Suspect recurrent rectocele or she could have pelvic floor dyssynergia.  If pelvic exam is normal would consider anorectal manometry unless  she responds to low-dose polyethylene glycol.  #3.  Weight loss.  She has lost 25 pounds in the last 2 years.  She has lost 7 pounds in the last 6 months.  All of the weight loss does not appear to be voluntary.  I wonder if pain medication has decreased her appetite and and she is eating less.  If weight loss continues she would also need to be checked for adrenocortical insufficiency.    Plan:  Referral to Dr. Blain Pais. Derrek Monaco, NP for gynecologic examination. Continue lansoprazole 30 mg by mouth daily before breakfast.  Prescription sent to patient's pharmacy for 90 doses with 3 refills. Polyethylene glycol 8.5 g daily or every other day and she can titrate the dose. Patient advised to eat at least 2 snacks between meals. If she continues to lose weight would consider further work-up starting with checking cortisol levels. Office visit in 6 months.

## 2021-01-25 NOTE — Patient Instructions (Addendum)
Consultation with office visit with Dr. Elonda Husky Ms Derrek Monaco, NP for pelvic exam. Take polyethylene glycol daily but can titrate dose as discussed.  Start with one fourth of a capful and go from there. Remember to eat 2 snacks between meals and also consider drinking supplement such as Ensure boost at least 2 cups/day.

## 2021-02-01 ENCOUNTER — Ambulatory Visit (INDEPENDENT_AMBULATORY_CARE_PROVIDER_SITE_OTHER): Payer: Medicare Other

## 2021-02-01 DIAGNOSIS — I495 Sick sinus syndrome: Secondary | ICD-10-CM | POA: Diagnosis not present

## 2021-02-01 LAB — CUP PACEART REMOTE DEVICE CHECK
Battery Remaining Longevity: 110 mo
Battery Voltage: 3 V
Brady Statistic AP VP Percent: 0.03 %
Brady Statistic AP VS Percent: 98.95 %
Brady Statistic AS VP Percent: 0 %
Brady Statistic AS VS Percent: 1.02 %
Brady Statistic RA Percent Paced: 99.16 %
Brady Statistic RV Percent Paced: 0.03 %
Date Time Interrogation Session: 20221115003558
Implantable Lead Implant Date: 20190501
Implantable Lead Implant Date: 20190501
Implantable Lead Location: 753859
Implantable Lead Location: 753860
Implantable Lead Model: 3830
Implantable Lead Model: 5076
Implantable Pulse Generator Implant Date: 20190501
Lead Channel Impedance Value: 285 Ohm
Lead Channel Impedance Value: 323 Ohm
Lead Channel Impedance Value: 342 Ohm
Lead Channel Impedance Value: 437 Ohm
Lead Channel Pacing Threshold Amplitude: 0.5 V
Lead Channel Pacing Threshold Amplitude: 0.625 V
Lead Channel Pacing Threshold Pulse Width: 0.4 ms
Lead Channel Pacing Threshold Pulse Width: 0.4 ms
Lead Channel Sensing Intrinsic Amplitude: 0.875 mV
Lead Channel Sensing Intrinsic Amplitude: 0.875 mV
Lead Channel Sensing Intrinsic Amplitude: 9.125 mV
Lead Channel Sensing Intrinsic Amplitude: 9.125 mV
Lead Channel Setting Pacing Amplitude: 2 V
Lead Channel Setting Pacing Amplitude: 2.5 V
Lead Channel Setting Pacing Pulse Width: 0.4 ms
Lead Channel Setting Sensing Sensitivity: 0.9 mV

## 2021-02-07 ENCOUNTER — Telehealth (INDEPENDENT_AMBULATORY_CARE_PROVIDER_SITE_OTHER): Payer: Self-pay

## 2021-02-07 NOTE — Telephone Encounter (Signed)
Patient called today stating she was advised by the office to take Miralax. She says she has read the instructions and it advised to not take if person has Kidney issues. Patient states she has Stage 3 CKD. Please advise.

## 2021-02-08 NOTE — Telephone Encounter (Signed)
Patient aware of all.   Per Dr. Laural Golden, patient may take the Miralax, with no issues as long as she has no diarrhea.

## 2021-02-09 NOTE — Progress Notes (Signed)
Remote pacemaker transmission.   

## 2021-02-23 ENCOUNTER — Other Ambulatory Visit: Payer: Self-pay | Admitting: Physician Assistant

## 2021-03-03 DIAGNOSIS — H25041 Posterior subcapsular polar age-related cataract, right eye: Secondary | ICD-10-CM | POA: Diagnosis not present

## 2021-03-11 ENCOUNTER — Encounter: Payer: Medicare Other | Admitting: Obstetrics & Gynecology

## 2021-03-30 ENCOUNTER — Encounter: Payer: Self-pay | Admitting: *Deleted

## 2021-03-31 ENCOUNTER — Encounter: Payer: Self-pay | Admitting: Neurology

## 2021-03-31 ENCOUNTER — Telehealth (INDEPENDENT_AMBULATORY_CARE_PROVIDER_SITE_OTHER): Payer: Self-pay | Admitting: *Deleted

## 2021-03-31 ENCOUNTER — Ambulatory Visit: Payer: Medicare Other | Admitting: Neurology

## 2021-03-31 VITALS — BP 112/70 | HR 63 | Ht 63.0 in | Wt 167.0 lb

## 2021-03-31 DIAGNOSIS — G7000 Myasthenia gravis without (acute) exacerbation: Secondary | ICD-10-CM

## 2021-03-31 DIAGNOSIS — R351 Nocturia: Secondary | ICD-10-CM

## 2021-03-31 DIAGNOSIS — G4733 Obstructive sleep apnea (adult) (pediatric): Secondary | ICD-10-CM | POA: Diagnosis not present

## 2021-03-31 DIAGNOSIS — R5383 Other fatigue: Secondary | ICD-10-CM

## 2021-03-31 DIAGNOSIS — Z820 Family history of epilepsy and other diseases of the nervous system: Secondary | ICD-10-CM

## 2021-03-31 NOTE — Telephone Encounter (Signed)
Patient was due for weight check around 1/8. She weight at dr's appt today in [place] and it was 167 lbs. Last weight here in office was on 01/25/21 and it was 168 lbs. States she eats a good breaksfast every morning - oatmeal with whole apple and a handful of pecans and maple syrup and a good dinner with a protein and 3 veggies sometimes adds a bread. And will either have one meal replacement shake or a snack that is a fruit or vegetable.   [phone]  Weight  01/20/20 - 184 lbs  01/25/21 - 168 03/31/21 - 167

## 2021-03-31 NOTE — Progress Notes (Signed)
Subjective:    Patient ID: [name] is a 81 y.o. female.  HPI    Star Age, MD, PhD [facility] [address], [address] [place], [place] [address]  Dear Loma Sousa,  I saw your patient, [name], upon your kind request in my sleep clinic today for initial consultation of her sleep disorder, in particular, evaluation for sleep apnea.  The patient is accompanied by her daughter today.  As you know, [name] is an 81 year old right-handed woman with an underlying complex medical history of breast cancer (status post partial mastectomy and currently on Arimidex), anemia, atrial fibrillation, bradycardia (status post pacemaker placement in 2019), depression, anxiety, diverticulosis, reflux disease, hypothyroidism, migraine headaches, irritable bowel syndrome, ocular myasthenia gravis (for which she had seen Dr. Jannifer Franklin in this office, transitioning to Dr. Krista Blue), and overweight state, who reports snoring and daytime tiredness and a strong family history of sleep apnea.  She also has a prior diagnosis of mild obstructive sleep.  I reviewed your office note from 10/12/2020.  I was able to review a prior sleep study.  She had a sleep study through the [facility] health system in [place], [place] on 07/12/2014, the study was interpreted by Dr. Phillips Odor.  Her total AHI was 5.4/h.  Oxygen desaturation nadir was 79% during REM sleep.  She had very little REM sleep.  I reviewed her test results with her and her daughter.   She is on multiple medications and also takes medications to help her sleep.  She has been on trazodone, and takes Xanax.  Her daughter is concerned about her snoring and apneic pauses in her breathing.  She is retired, she was a Quarry manager.  She lives alone, she had 4 children, unfortunately 2 of her kids passed away.  Altogether, 3 children have or had sleep apnea.  She is a non-smoker and does not drink alcohol and drinks very little caffeine in  the form of tea 1 cup/day and half a soda bottle.  She goes to bed late typically.  She will be in bed between 11 and midnight and may repeat till 2 AM.  Rise time may be around 10 AM or later depending on when she actually fell asleep.  She does have nocturia about 2-3 times per average night.  She used to wake up with headaches and had migraines in the past but currently does not suffer from recurrent headaches.   Her Epworth sleepiness score is 4 out of 24, fatigue severity score is 36 out of 63.     Her Past Medical History Is Significant For: Past Medical History:  Diagnosis Date   Anemia    Atrial fibrillation ([place])    Breast cancer ([place]) 09/2019   right breast    Chronic back pain    Chronic nausea    Complication of anesthesia    hypotension, elevated heart rate and oxygenation desaturation   Depression with anxiety    Diverticulosis    Dyspnea    due to myasthenia gravis   Essential hypertension    GERD (gastroesophageal reflux disease)    History of pneumonia    Hypothyroidism    IBS (irritable colon syndrome)    LVH (left ventricular hypertrophy)    Migraines    Mixed hyperlipidemia    Obesity    Ocular myasthenia gravis (HCC)    Osteoarthritis of knee    Pacemaker    PAF (paroxysmal atrial fibrillation) ([place])    chads2vasc  score is at least 4   Paraganglioma [facility])    Resection 02/2012 (retroperitoneal)   Paraganglioma, malignant ([place]) 11/06/2015   Presence of permanent cardiac pacemaker 07/18/2017   Renal insufficiency    Skin cancer     Her Past Surgical History Is Significant For: Past Surgical History:  Procedure Laterality Date   ABDOMINAL HYSTERECTOMY     BREAST BIOPSY Right 09/2019   BREAST LUMPECTOMY Right 10/28/2019   CARDIAC CATHETERIZATION     CHOLECYSTECTOMY     COLONOSCOPY  08/24/2011   Procedure: COLONOSCOPY;  Surgeon: Rogene Houston, MD;  [facility];  Service: Endoscopy;  Laterality: N/A;  1200   CT guided biopsy  01/16/12    GIVENS CAPSULE STUDY  01/29/2012   Procedure: GIVENS CAPSULE STUDY;  Surgeon: Rogene Houston, MD;  Location: [facility];  Service: Endoscopy;  Laterality: N/A;  [place]     LAPAROTOMY  03/01/2012   Procedure: EXPLORATORY LAPAROTOMY;  Surgeon: Earnstine Regal, MD;  Location: [facility] OR[facility];  Service: General;  Laterality: N/A;  Exploratory Laparotomy ,Resection Retroperitoneal Mass   MASTECTOMY, PARTIAL Right 10/28/2019   Procedure: RIGHT BREAST PARTIAL MASTECTOMY;  Surgeon: Armandina Gemma, MD;  Location: [place];  Service: General;  Laterality: Right;   NASAL SINUS SURGERY     30 yrs ago   PACEMAKER IMPLANT N/A 07/18/2017   Procedure: PACEMAKER IMPLANT;  Surgeon: Evans Lance, MD;  Location: [place] CV LAB;  Service: Cardiovascular;  Laterality: N/A;   RIGHT/LEFT HEART CATH AND CORONARY ANGIOGRAPHY N/A 11/18/2018   Procedure: RIGHT/LEFT HEART CATH AND CORONARY ANGIOGRAPHY;  Surgeon: Belva Crome, MD;  Location: [place] CV LAB;  Service: Cardiovascular;  Laterality: N/A;   TOTAL KNEE ARTHROPLASTY Bilateral    2005 and 2011    Her Family History Is Significant For: Family History  Problem Relation Age of Onset   Inflammatory bowel disease Maternal Grandmother    Sleep apnea Daughter    Sleep apnea Daughter    Sleep apnea Son     Her Social History Is Significant For: Social History   Socioeconomic History   Marital status: Widowed    Spouse name: Not on file   Number of children: Not on file   Years of education: Not on file   Highest education level: Not on file  Occupational History   Occupation: Retired CCU nurse  Tobacco Use   Smoking status: Never   Smokeless tobacco: Never  Vaping Use   Vaping Use: Never used  Substance and Sexual Activity   Alcohol use: No    Alcohol/week: 0.0 standard drinks   Drug use: No   Sexual activity: Not on file  Other Topics Concern   Not on file  Social History Narrative   Lives in [place] alone.   Retired Quarry manager.   Caffeine use: few ounces of coke about a every day, hot tea    Right handed   Social Determinants of Health   Financial Resource Strain: Not on file  Food Insecurity: Not on file  Transportation Needs: Not on file  Physical Activity: Not on file  Stress: Not on file  Social Connections: Not on file    Her Allergies Are:  Allergies  Allergen Reactions   Nitrofurantoin Macrocrystal Nausea And Vomiting   Ampicillin Rash    Did it involve swelling of the face/tongue/throat, SOB, or low BP? No Did it involve sudden or severe rash/hives, skin peeling, or  any reaction on the inside of your mouth or nose? No Did you need to seek medical attention at a hospital or doctor's office? No When did it last happen? 20 years ago     If all above answers are NO, may proceed with cephalosporin use.    Avocado Diarrhea and Nausea And Vomiting   Biaxin [Clarithromycin] Rash   Doxycycline Monohydrate Nausea And Vomiting   Moxifloxacin     Contraindication myasthenia gravis   Sensi-Care Protective Barrier [Petrolatum-Zinc Oxide] Rash   Tetracyclines & Related Nausea And Vomiting  :   Her Current Medications Are:  Outpatient Encounter Medications as of 03/31/2021  Medication Sig   ALPRAZolam (XANAX) 0.5 MG tablet Take 1 mg by mouth 2 (two) times daily. Takes as needed.   anastrozole (ARIMIDEX) 1 MG tablet Take 1 tablet (1 mg total) by mouth daily.   apixaban (ELIQUIS) 5 MG TABS tablet TAKE (1) TABLET BY MOUTH TWICE DAILY.   Artificial Tear Solution (SOOTHE XP OP) Place 1 drop into both eyes 4 (four) times daily.   atenolol (TENORMIN) 25 MG tablet TAKE (1) TABLET BYMOUTH ONCE DAILY. MAY TAKE AN ADDITIONAL TABLET IF SYSTOLIC OVER 007 OR SEVERE PALPITATIONS.   atorvastatin (LIPITOR) 80 MG tablet Take 80 mg by mouth daily.   B Complex-C (B-COMPLEX WITH VITAMIN C) tablet Take 1 tablet by mouth daily with supper.    baclofen (LIORESAL) 10 MG tablet Take 0.5 tablets (5 mg total) by  mouth at bedtime as needed for muscle spasms.   Coenzyme Q10 (COQ10) 100 MG CAPS Take 100 mg by mouth daily.    diclofenac sodium (VOLTAREN) 1 % GEL Apply 4 g topically 4 (four) times daily as needed (for pain).    docusate sodium (COLACE) 100 MG capsule Take 100 mg by mouth 2 (two) times daily.   gabapentin (NEURONTIN) 300 MG capsule Take 1 capsule (300 mg total) by mouth 3 (three) times daily.   lansoprazole (PREVACID) 30 MG capsule Take 1 capsule (30 mg total) by mouth daily before breakfast. TAKE (1) CAPSULE BY MOUTH TWICE DAILY.   levothyroxine (SYNTHROID) 100 MCG tablet Take 100 mcg by mouth daily.   lidocaine (LIDODERM) 5 % Place 1-3 patches onto the skin daily as needed (for pain). Remove & Discard patch within 12 hours or as directed by MD for pain   mycophenolate (CELLCEPT) 500 MG tablet TAKE (1) TABLET BY MOUTH TWICE DAILY.   OVER THE COUNTER MEDICATION Calcium and vit D one bid   oxyCODONE-acetaminophen (PERCOCET) 10-325 MG tablet Take 1 tablet by mouth every 4 (four) hours as needed (pain).   polyethylene glycol powder (GLYCOLAX/MIRALAX) 17 GM/SCOOP powder Take 8.5 g by mouth daily. (Patient taking differently: Take 8.5 g by mouth 2 (two) times daily.)   predniSONE (DELTASONE) 1 MG tablet Take 4 tablets (4 mg total) by mouth daily with breakfast.   predniSONE (DELTASONE) 5 MG tablet Take 1 tablet (5 mg total) by mouth daily with breakfast. 2 tablets (Patient taking differently: Take 5 mg by mouth daily with breakfast.)   pyridostigmine (MESTINON) 60 MG tablet TAKE (1/2) TABLET BY MOUTH FOUR TIMES DAILY.   SUMAtriptan (IMITREX) 50 MG tablet Take 50 mg by mouth every 2 (two) hours as needed. For migraines   Terbinafine 1 % GEL Apply 1 application topically 2 (two) times daily as needed (toe nail fungus).    traZODone (DESYREL) 50 MG tablet TAKE (1) TABLET BY MOUTH AT BEDTIME.   [DISCONTINUED] levothyroxine (SYNTHROID) 88 MCG tablet Take  1 tablet (88 mcg total) by mouth daily before  breakfast.   No facility-administered encounter medications on file as of 03/31/2021.  :   Review of Systems:  Out of a complete 14 point review of systems, all are reviewed and negative with the exception of these symptoms as listed below:  Review of Systems  Neurological:        Patient is here with her daughter Charisse Klinefelter (daughter, HCPOA) for a sleep consult to see if she has sleep apnea. She states she had a sleep study maybe 20 years ago and was told she had a little apnea that wasn't worth treating.    Objective:  Neurological Exam  Physical Exam Physical Examination:   Vitals:   03/31/21 1121  BP: 112/70  Pulse: 63    General Examination: The patient is a very pleasant 81 y.o. female in no acute distress. She appears well-developed and well-nourished and well groomed.   HEENT: Normocephalic, atraumatic, pupils are equal, round and reactive to light, extraocular tracking is good without limitation to gaze excursion or nystagmus noted. Hearing is grossly intact. Face is symmetric with mild ptosis.  She has a dense cataract on the right, status post cataract repair on the left.  Speech is clear with no dysarthria noted. There is no hypophonia. There is no lip, neck/head, jaw or voice tremor. Neck is supple with full range of passive and active motion. There are no carotid bruits on auscultation.  Somewhat prominent thyroid especially on the right. Oropharynx exam reveals: mild mouth dryness, adequate dental hygiene and mild airway crowding, due to redundant soft palate, tonsils on the small side, Mallampati class II. Tongue protrudes centrally and palate elevates symmetrically.   Chest: Clear to auscultation without wheezing, rhonchi or crackles noted.  Heart: S1+S2+0, regular and normal without murmurs, rubs or gallops noted.   Abdomen: Soft, non-tender and non-distended.  Extremities: There is no pitting edema in the distal lower extremities bilaterally.   Skin: Warm  and dry without trophic changes noted.   Musculoskeletal: exam reveals excess curvature in the upper back, arthritic changes in both hands.  Very limited mobility in both shoulders.  Neurologically:  Mental status: The patient is awake, alert and oriented in all 4 spheres. Her immediate and remote memory, attention, language skills and fund of knowledge are appropriate. There is no evidence of aphasia, agnosia, apraxia or anomia. Speech is clear with normal prosody and enunciation. Thought process is linear. Mood is normal and affect is normal.  Cranial nerves II - XII are as described above under HEENT exam.  Motor exam: Normal bulk, moves all 4 extremities but has limited range of motion in both shoulders, no obvious tremor.   Cerebellar testing: No dysmetria or intention tremor. There is no truncal or gait ataxia.  Sensory exam: intact to light touch in the upper and lower extremities.  Gait, station and balance: She stands with mild difficulty and walks slowly and cautiously.  Assessment and Plan:  In summary, CLARIBEL SACHS is a very pleasant 81 y.o.-year old female with an underlying complex medical history of breast cancer (status post partial mastectomy and currently on Arimidex), anemia, atrial fibrillation, bradycardia (status post pacemaker placement in 2019), depression, anxiety, diverticulosis, reflux disease, hypothyroidism, migraine headaches, irritable bowel syndrome, ocular myasthenia gravis (for which she had seen Dr. Jannifer Franklin in this office, transitioning to Dr. Krista Blue), and overweight state, whose history and physical exam are concerning for obstructive sleep apnea (OSA). I had a long chat  with the patient and the daughter about my findings and the diagnosis of OSA, its prognosis and treatment options. We talked about medical treatments, surgical interventions and non-pharmacological approaches. I explained in particular the risks and ramifications of untreated moderate to severe OSA,  especially with respect to developing cardiovascular disease down the Road, including congestive heart failure, difficult to treat hypertension, cardiac arrhythmias, or stroke. Even type 2 diabetes has, in part, been linked to untreated OSA. Symptoms of untreated OSA include daytime sleepiness, memory problems, mood irritability and mood disorder such as depression and anxiety, lack of energy, as well as recurrent headaches, especially morning headaches. We talked about trying to maintain a healthy lifestyle in general, as well as the importance of weight control. We also talked about the importance of good sleep hygiene. I recommended the following at this time: sleep study.  She is sleeps with a neck pillow for support.  She has not had a bed elevated at home due to reflux.  She does not require assistance.  Her daughter would bring her and pick her up after her sleep study. I explained the sleep test procedure to the patient and also outlined possible surgical and non-surgical treatment options of OSA, including the use of a custom-made dental device (which would require a referral to a specialist dentist or oral surgeon), upper airway surgical options, such as traditional UPPP or a novel less invasive surgical option in the form of Inspire hypoglossal nerve stimulation (which would involve a referral to an ENT surgeon). I also explained the CPAP treatment option to the patient, who indicated that she would be willing to try CPAP if the need arises. I explained the importance of being compliant with PAP treatment, not only for insurance purposes but primarily to improve Her symptoms, and for the patient's long term health benefit, including to reduce Her cardiovascular risks. I answered all her questions today and the patient and they were in agreement. I plan to see her back after the sleep study is completed and encouraged her to call with any interim questions, concerns, problems or updates.   Thank you  very much for allowing me to participate in the care of this nice patient. If I can be of any further assistance to you please do not hesitate to call me at [phone].  Sincerely,   Star Age, MD, PhD

## 2021-04-06 NOTE — Telephone Encounter (Signed)
Per dr Laural Golden - would like for patient to see dietician  for weight loss. Called and discussed with pt. 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 She has upcoming appt in may and states she will call back if weight drops.

## 2021-04-08 DIAGNOSIS — E039 Hypothyroidism, unspecified: Secondary | ICD-10-CM | POA: Diagnosis not present

## 2021-04-08 DIAGNOSIS — E782 Mixed hyperlipidemia: Secondary | ICD-10-CM | POA: Diagnosis not present

## 2021-04-08 DIAGNOSIS — E559 Vitamin D deficiency, unspecified: Secondary | ICD-10-CM | POA: Diagnosis not present

## 2021-04-08 DIAGNOSIS — I4891 Unspecified atrial fibrillation: Secondary | ICD-10-CM | POA: Diagnosis not present

## 2021-04-08 DIAGNOSIS — F411 Generalized anxiety disorder: Secondary | ICD-10-CM | POA: Diagnosis not present

## 2021-04-08 DIAGNOSIS — N1831 Chronic kidney disease, stage 3a: Secondary | ICD-10-CM | POA: Diagnosis not present

## 2021-04-14 NOTE — Telephone Encounter (Signed)
error 

## 2021-04-19 ENCOUNTER — Encounter: Payer: Medicare Other | Admitting: Obstetrics & Gynecology

## 2021-04-21 ENCOUNTER — Telehealth: Payer: Self-pay

## 2021-04-21 NOTE — Telephone Encounter (Signed)
LVM for pt to call me back to schedule sleep study  

## 2021-05-03 ENCOUNTER — Ambulatory Visit (INDEPENDENT_AMBULATORY_CARE_PROVIDER_SITE_OTHER): Payer: Medicare Other

## 2021-05-03 ENCOUNTER — Telehealth: Payer: Self-pay

## 2021-05-03 DIAGNOSIS — I495 Sick sinus syndrome: Secondary | ICD-10-CM | POA: Diagnosis not present

## 2021-05-03 LAB — CUP PACEART REMOTE DEVICE CHECK
Battery Remaining Longevity: 108 mo
Battery Voltage: 3 V
Brady Statistic AP VP Percent: 0.03 %
Brady Statistic AP VS Percent: 98.46 %
Brady Statistic AS VP Percent: 0 %
Brady Statistic AS VS Percent: 1.51 %
Brady Statistic RA Percent Paced: 98.69 %
Brady Statistic RV Percent Paced: 0.03 %
Date Time Interrogation Session: 20230214003923
Implantable Lead Implant Date: 20190501
Implantable Lead Implant Date: 20190501
Implantable Lead Location: 753859
Implantable Lead Location: 753860
Implantable Lead Model: 3830
Implantable Lead Model: 5076
Implantable Pulse Generator Implant Date: 20190501
Lead Channel Impedance Value: 323 Ohm
Lead Channel Impedance Value: 342 Ohm
Lead Channel Impedance Value: 361 Ohm
Lead Channel Impedance Value: 475 Ohm
Lead Channel Pacing Threshold Amplitude: 0.625 V
Lead Channel Pacing Threshold Amplitude: 0.625 V
Lead Channel Pacing Threshold Pulse Width: 0.4 ms
Lead Channel Pacing Threshold Pulse Width: 0.4 ms
Lead Channel Sensing Intrinsic Amplitude: 0.625 mV
Lead Channel Sensing Intrinsic Amplitude: 0.625 mV
Lead Channel Sensing Intrinsic Amplitude: 10.625 mV
Lead Channel Sensing Intrinsic Amplitude: 10.625 mV
Lead Channel Setting Pacing Amplitude: 2 V
Lead Channel Setting Pacing Amplitude: 2.5 V
Lead Channel Setting Pacing Pulse Width: 0.4 ms
Lead Channel Setting Sensing Sensitivity: 0.9 mV

## 2021-05-03 NOTE — Telephone Encounter (Signed)
LVM for pt to call me back to schedule sleep study  

## 2021-05-04 ENCOUNTER — Telehealth: Payer: Self-pay

## 2021-05-04 NOTE — Telephone Encounter (Signed)
Returned pt's call and LVM for pt to call me back to schedule sleep study ° °

## 2021-05-09 NOTE — Progress Notes (Signed)
Remote pacemaker transmission.   

## 2021-05-15 ENCOUNTER — Ambulatory Visit (INDEPENDENT_AMBULATORY_CARE_PROVIDER_SITE_OTHER): Payer: Medicare Other | Admitting: Neurology

## 2021-05-15 DIAGNOSIS — Z820 Family history of epilepsy and other diseases of the nervous system: Secondary | ICD-10-CM

## 2021-05-15 DIAGNOSIS — G7000 Myasthenia gravis without (acute) exacerbation: Secondary | ICD-10-CM

## 2021-05-15 DIAGNOSIS — G4733 Obstructive sleep apnea (adult) (pediatric): Secondary | ICD-10-CM

## 2021-05-15 DIAGNOSIS — R5383 Other fatigue: Secondary | ICD-10-CM

## 2021-05-15 DIAGNOSIS — R351 Nocturia: Secondary | ICD-10-CM

## 2021-05-15 DIAGNOSIS — G4720 Circadian rhythm sleep disorder, unspecified type: Secondary | ICD-10-CM

## 2021-05-19 NOTE — Procedures (Signed)
PATIENT'S NAME:  [name], [name] DOB:      05-31-1940      MR#:    [id number]     DATE OF RECORDING: 05/15/2021 REFERRING M.D.:  Pablo Lawrence, NP Study Performed:   Baseline Polysomnogram HISTORY: 81 year old woman with a history of breast cancer, anemia, atrial fibrillation, bradycardia, pacemaker placement, depression, anxiety, diverticulosis, reflux disease, hypothyroidism, migraine headaches, irritable bowel syndrome, ocular myasthenia gravis, and overweight state, who reports snoring and daytime tiredness and a strong family history of sleep apnea.  She also has a prior diagnosis of mild obstructive sleep. The patient endorsed the Epworth Sleepiness Scale at 4 points. The patient's weight 167 pounds with a height of 63 (inches), resulting in a BMI of 29.7 kg/m2.   CURRENT MEDICATIONS: xanax, Arimidex, Eliquis, Soothe XP, Tenormin, Lipitor, B-Complex, Lioresal, COQ10, Voltaren, Colace, Neurontin, Prevacid, Synthroid, Lidoderm, Cellcept, Percocet, Glycolax/Miralax, Deltasone, Mestinon, Imitrex, Terbinafine, Desyrel   PROCEDURE:  This is a multichannel digital polysomnogram utilizing the Somnostar 11.2 system.  Electrodes and sensors were applied and monitored per AASM Specifications.   EEG, EOG, Chin and Limb EMG, were sampled at 200 Hz.  ECG, Snore and Nasal Pressure, Thermal Airflow, Respiratory Effort, CPAP Flow and Pressure, Oximetry was sampled at 50 Hz. Digital video and audio were recorded.      BASELINE STUDY  The patient took several prescription medications, including Xanax and also a narcotic pain medication in nthe middle of the night. Lights Out was at 22:37 and Lights On at 05:00.  Total recording time (TRT) was 384 minutes, with a total sleep time (TST) of 317.5 minutes.   The patient's sleep latency was 24 minutes.  REM latency was 135 minutes, which is mildly delayed. The sleep efficiency was 82.7 %.     SLEEP ARCHITECTURE: WASO (Wake after sleep onset) was 48.5 minutes with 2 longer  periods of wakefulness and otherwise no significant sleep fragmentation noted. There were 26 minutes in Stage N1, 270.5 minutes Stage N2, 0 minutes Stage N3 and 21 minutes in Stage REM.  The percentage of Stage N1 was 8.2%, Stage N2 was 85.2%, which is markedly increased, Stage N3 was absent and Stage R (REM sleep) was 6.6%, which is markedly reduced. The arousals were noted as: 9 were spontaneous, 0 were associated with PLMs, 5 were associated with respiratory events.  RESPIRATORY ANALYSIS:  There were a total of 151 respiratory events:  25 obstructive apneas, 0 central apneas and 0 mixed apneas with a total of 25 apneas and an apnea index (AI) of 4.7 /hour. There were 126 hypopneas with a hypopnea index of 23.8 /hour. The patient also had 0 respiratory event related arousals (RERAs).      The total APNEA/HYPOPNEA INDEX (AHI) was 28.5/hour and the total RESPIRATORY DISTURBANCE INDEX was  28.5 /hour.  14 events occurred in REM sleep and 234 events in NREM. The REM AHI was  40 /hour, versus a non-REM AHI of 27.7. The patient spent 317.5 minutes of total sleep time in the supine position and 0 minutes in non-supine.. The supine AHI was 28.5 versus a non-supine AHI of n/a.  OXYGEN SATURATION & C02:  The Wake baseline 02 saturation was 94%, with the lowest being 74%. Time spent below 89% saturation equaled 143 minutes.  PERIODIC LIMB MOVEMENTS: The patient had a total of 0 Periodic Limb Movements.  The Periodic Limb Movement (PLM) index was 0 and the PLM Arousal index was 0/hour.  Audio and video analysis did not show any abnormal or  unusual movements, behaviors, phonations or vocalizations. The patient took bathroom breaks. Intermittent mild to moderate snoring was noted. The EKG was in keeping with normal sinus rhythm (NSR).  Post-study, the patient indicated that sleep was the same as usual.   IMPRESSION:  Obstructive Sleep Apnea (OSA) Dysfunctions associated with sleep stages or arousal from  sleep  RECOMMENDATIONS:  This study demonstrates moderate to severe obstructive sleep apnea, with a total AHI of 28.5/hour, REM AHI of 40/hour, supine AHI of 28.5/hour and O2 nadir of 74%. There was a significant time below 89% saturation of over 2 hours. Treatment with positive airway pressure in the form of CPAP is recommended. This will require a full night titration study to optimize therapy. Other treatment options may include surgical options in selected patients or the use of an oral appliance in certain patients. Please note that untreated obstructive sleep apnea may carry additional perioperative morbidity. Patients with significant obstructive sleep apnea should receive perioperative PAP therapy and the surgeons and particularly the anesthesiologist should be informed of the diagnosis and the severity of the sleep disordered breathing. This study shows abnormal sleep stage percentages; these are nonspecific findings and per se do not signify an intrinsic sleep disorder or a cause for the patient's sleep-related symptoms. Causes include (but are not limited to) the first night effect of the sleep study, circadian rhythm disturbances, medication effect or an underlying mood disorder or medical problem.  The patient should be cautioned not to drive, work at heights, or operate dangerous or heavy equipment when tired or sleepy. Review and reiteration of good sleep hygiene measures should be pursued with any patient. The patient will be seen in follow-up in the sleep clinic at [facility] for discussion of the test results, symptom and treatment compliance review, further management strategies, etc. The referring provider will be notified of the test results.  I certify that I have reviewed the entire raw data recording prior to the issuance of this report in accordance with the Standards of Accreditation of the American Academy of Sleep Medicine (AASM)  Star Age, MD, PhD Diplomat, American Board of  Neurology and Sleep Medicine (Neurology and Sleep Medicine)

## 2021-05-19 NOTE — Addendum Note (Signed)
Addended by: Star Age on: 05/19/2021 06:14 PM   Modules accepted: Orders

## 2021-05-24 ENCOUNTER — Telehealth: Payer: Self-pay | Admitting: *Deleted

## 2021-05-24 NOTE — Telephone Encounter (Signed)
Spoke with [name]'s daughter and the [name]. They are aware the sleep study showed moderate to severe sleep apnea.  Treatment is recommended in the form of CPAP but this will require a repeat titration and mask fitting and correct monitoring of oxygen saturations.  Patient was already scheduled for CPAP titration on March 16 at 8 PM.  ?

## 2021-05-24 NOTE — Telephone Encounter (Signed)
-----   Message from Star Age, MD sent at 05/19/2021  6:14 PM EST ----- ?Patient referred by PCP NP, seen by me on 03/31/21, diagnostic PSG on 05/15/21.   ?Please call and notify the patient that the recent sleep study showed moderate to severe obstructive sleep apnea. I recommend treatment for this in the form of CPAP. This will require a repeat sleep study for proper titration and mask fitting and correct monitoring of the oxygen saturations. Please explain to patient. I have placed an order in the chart. Thanks. ? ?Star Age, MD, PhD ?[facility]) ? ? ? ?

## 2021-05-30 DIAGNOSIS — Z853 Personal history of malignant neoplasm of breast: Secondary | ICD-10-CM | POA: Insufficient documentation

## 2021-05-30 DIAGNOSIS — Z9011 Acquired absence of right breast and nipple: Secondary | ICD-10-CM | POA: Insufficient documentation

## 2021-06-02 ENCOUNTER — Ambulatory Visit (INDEPENDENT_AMBULATORY_CARE_PROVIDER_SITE_OTHER): Payer: Medicare Other | Admitting: Neurology

## 2021-06-02 ENCOUNTER — Other Ambulatory Visit: Payer: Self-pay

## 2021-06-02 DIAGNOSIS — R9431 Abnormal electrocardiogram [ECG] [EKG]: Secondary | ICD-10-CM

## 2021-06-02 DIAGNOSIS — G4720 Circadian rhythm sleep disorder, unspecified type: Secondary | ICD-10-CM

## 2021-06-02 DIAGNOSIS — Z820 Family history of epilepsy and other diseases of the nervous system: Secondary | ICD-10-CM

## 2021-06-02 DIAGNOSIS — G7000 Myasthenia gravis without (acute) exacerbation: Secondary | ICD-10-CM

## 2021-06-02 DIAGNOSIS — R351 Nocturia: Secondary | ICD-10-CM

## 2021-06-02 DIAGNOSIS — R5383 Other fatigue: Secondary | ICD-10-CM

## 2021-06-02 DIAGNOSIS — G4733 Obstructive sleep apnea (adult) (pediatric): Secondary | ICD-10-CM

## 2021-06-09 NOTE — Addendum Note (Signed)
Addended by: Star Age on: 06/09/2021 05:54 PM ? ? Modules accepted: Orders ? ?

## 2021-06-09 NOTE — Procedures (Signed)
PATIENT'S NAME:  [name], [name] ?DOB:      1940/07/17      ?MR#:    [id number]     ?DATE OF RECORDING: 06/02/2021 ?REFERRING M.D.:  Pablo Lawrence, NP ?Study Performed:   CPAP  Titration ?HISTORY: 81 year old woman with a history of breast cancer, anemia, atrial fibrillation, bradycardia, pacemaker placement, depression, anxiety, diverticulosis, reflux disease, hypothyroidism, migraine headaches, irritable bowel syndrome, ocular myasthenia gravis, and overweight state, who presents for a full night titration study. Her baseline sleep study from 05/15/21 showed moderate to severe obstructive sleep apnea, with a total AHI of 28.5/hour, REM AHI of 40/hour, supine AHI of 28.5/hour and O2 nadir of 74%. The patient's weight 167 pounds with a height of 63 (inches), resulting in a BMI of 29.7 kg/m2.  ? ?CURRENT MEDICATIONS: Xanax, Arimidex, Eliquis, Soothe XP, Tenormin, Lipitor, B-Complex, Lioresal, COQ10, Voltaren, Colace, Neurontin, Prevacid, Synthroid, Lidoderm, Cellcept, Percocet, Glycolax/Miralax, Deltasone, Mestinon, Imitrex, Terbinafine, Desyrel ? ?PROCEDURE:  This is a multichannel digital polysomnogram utilizing the SomnoStar 11.2 system.  Electrodes and sensors were applied and monitored per AASM Specifications.   EEG, EOG, Chin and Limb EMG, were sampled at 200 Hz.  ECG, Snore and Nasal Pressure, Thermal Airflow, Respiratory Effort, CPAP Flow and Pressure, Oximetry was sampled at 50 Hz. Digital video and audio were recorded.     ? ?The patient was shown and tried on different mask and the mask used for most of the study was a small [place] FFM. CPAP was initiated at 5 cmH20 with heated humidity per AASM standards and pressure was advanced to 11 cmH20 because of hypopneas, apneas and desaturations. The patient had difficulty maintaining sleep. At a pressure of 11 cm her AHI was 46.8/hour with O2 nadir of 83% and supine NREM sleep achieved.  ? ?Lights Out was at 22:07 and Lights On at 04:59. Total recording time (TRT) was  413 minutes, with a total sleep time (TST) of 213.5 minutes. The patient's sleep latency was 59 minutes, which is delayed. REM latency was 66.5 minutes.  The sleep efficiency was 51.7%, which is reduced.   ? ?SLEEP ARCHITECTURE: WASO (Wake after sleep onset) was 181 minutes with 2 longer periods of wakefulness.  There were 15 minutes in Stage N1, 152 minutes Stage N2, 29.5 minutes Stage N3 and 17 minutes in Stage REM.  The percentage of Stage N1 was 7.%, Stage N2 was 71.2%, which is markedly increased, Stage N3 was 13.8% and Stage R (REM sleep) was 8%, which is reduced. The arousals were noted as: 16 were spontaneous, 0 were associated with PLMs, 11 were associated with respiratory events. ? ?RESPIRATORY ANALYSIS:  There was a total of 39 respiratory events: 35 obstructive apneas, 0 central apneas and 0 mixed apneas with a total of 35 apneas and an apnea index (AHI) of 9.8 /hour. There were 4 hypopneas with a hypopnea index of 1.1/hour. The patient also had 1 respiratory event related arousals (RERAs).     ? ?The total APNEA/HYPOPNEA INDEX  (AHI) was 11. /hour and the total RESPIRATORY DISTURBANCE INDEX was 11.2 /hour  1 events occurred in REM sleep and 38 events in NREM. The REM AHI was 3.5 /hour versus a non-REM AHI of 11.6 /hour.  The patient spent 213.5 minutes of total sleep time in the supine position and 0 minutes in non-supine. The supine AHI was 10.9, versus a non-supine AHI of 0.0. ? ?OXYGEN SATURATION & C02:  The baseline 02 saturation was 96%, with the lowest being 83%. Time spent  below 89% saturation equaled 12 minutes.  ? ?PERIODIC LIMB MOVEMENTS:  The patient had a total of 0 Periodic Limb Movements. The Periodic Limb Movement (PLM) index was 0 and the PLM Arousal index was 0 /hour. ? ?Audio and video analysis did not show any abnormal or unusual movements, behaviors, phonations or vocalizations. The patient took 1 bathroom break. The EKG showed frequent PVCs.  ? ?Post-study, the patient indicated  that sleep was worse than usual.  ? ?IMPRESSION: ?  ?Obstructive Sleep Apnea (OSA) ?Dysfunctions associated with sleep stages or arousal from sleep  ?Non-specific abnormal EKG ?  ?RECOMMENDATIONS: ?  ?This study demonstrates limited results and no complete resolution of the patient's obstructive sleep apnea with CPAP therapy of up to 11 cm. The study was limited due to poor sleep consolidation. I will recommend that the patient start on home autoPAP therapy for now, via small FFM of choice. The patient will be advised to be fully compliant with PAP therapy to improve sleep related symptoms and decrease long term cardiovascular risks. The patient should be reminded, that it may take up to 3 months to get fully used to using PAP with all planned sleep. The earlier full compliance is achieved, the better long term compliance tends to be. Please note that untreated obstructive sleep apnea may carry additional perioperative morbidity. Patients with significant obstructive sleep apnea should receive perioperative PAP therapy and the surgeons and particularly the anesthesiologist should be informed of the diagnosis and the severity of the sleep disordered breathing. ?The study showed frequent PVCs on single lead EKG; clinical correlation is recommended and consultation with cardiology may be feasible.  ?This study shows abnormal sleep stage percentages; these are nonspecific findings and per se do not signify an intrinsic sleep disorder or a cause for the patient's sleep-related symptoms. Causes include (but are not limited to) the first night effect of the sleep study, circadian rhythm disturbances, medication effect or an underlying mood disorder or medical problem.  ?The patient should be cautioned not to drive, work at heights, or operate dangerous or heavy equipment when tired or sleepy. Review and reiteration of good sleep hygiene measures should be pursued with any patient. ?The patient will be seen in follow-up in  the sleep clinic at [facility] for discussion of the test results, symptom and treatment compliance review, further management strategies, etc. The referring provider will be notified of the test results. ?  ?I certify that I have reviewed the entire raw data recording prior to the issuance of this report in accordance with the Standards of Accreditation of the [place] [employer] (AASM) ?  ?Star Age, MD, PhD ?Diplomat, American Board of Neurology and Sleep Medicine (Neurology and Sleep Medicine) ?

## 2021-06-14 ENCOUNTER — Telehealth: Payer: Self-pay | Admitting: *Deleted

## 2021-06-14 ENCOUNTER — Encounter: Payer: Self-pay | Admitting: *Deleted

## 2021-06-14 NOTE — Telephone Encounter (Signed)
Spoke with the patient and her daughter, [place] Livings (on [place]) and discussed results of this most recent sleep study.  They are aware that the patient did not have complete resolution of her sleep apnea during the study as she did not sleep well.  Dr. Rexene Alberts has placed an order for patient to start AutoPap therapy at home.  Insurance requires usage of at least 4 hours at night.  Our goal for her would be to use it all night long.  Patient aware insurance will also require her to be seen between 31 and 90 days for an initial follow-up to check usage and ensure sleep apnea is well controlled on current settings.  I have asked her to bring the machine and power cord with her to the appointments in case we need to use it to collect the data. They would like to use [facility] in [place] for DME if possible.  I advised would reach out to see if her insurance can be accepted.  Patient was scheduled for an initial follow-up on Tuesday June 28th at 2:15 pm arrival 15 to 30 minutes early.  We also discussed that PVCs were noted on EKG during the stud.  Dr. Rexene Alberts has recommended a formal EKG with primary care or cardiology.  Patient asked for the results to be forwarded to Dr. Crissie Sickles with cardiology in addition to primary care, Pablo Lawrence NP. Patient stated she has a pacemaker for atrial fibrillation but she has never heard that she has PVCs.  Their questions were answered during the call and they verbalized appreciation.  ? ?I called [employer] and left a message for Mariann Laster to call me back to say if she can take patient's insurance which is [employer].  ?

## 2021-06-14 NOTE — Telephone Encounter (Signed)
-----   Message from Star Age, MD sent at 06/09/2021  5:54 PM EDT ----- ?Patient referred by PCP NP, seen by me on 03/31/21, diagnostic PSG on 05/15/21. Patient had a CPAP titration study on 06/02/21.  ?Please call and inform patient that I have entered an order for treatment with positive airway pressure (PAP) treatment for obstructive sleep apnea (OSA). She did not have complete resolution of her sleep apnea during the study; she did not sleep well. I would like for her to start autoPAP therapy at home through a DME (durable medical equipment) company of Her choice; and I will see the patient back in follow-up in about 10 weeks. Please also explain to the patient that I will be looking out for compliance data, which can be downloaded from the machine (stored on an SD card, that is inserted in the machine) or via remote access through a modem, that is built into the machine. At the time of the followup appointment we will discuss sleep study results and how it is going with PAP treatment at home. Please advise patient to bring Her machine at the time of the first FU visit, even though this is cumbersome. Bringing the machine for every visit after that will likely not be needed, but often helps for the first visit to troubleshoot if needed. Please re-enforce the importance of compliance with treatment and the need for [place] to monitor compliance data - often an insurance requirement and actually good feedback for the patient as far as how they are doing.  ?Also remind patient, that any interim PAP machine or mask issues should be first addressed with the DME company, as they can often help better with technical and mask fit issues. Please ask if patient has a preference regarding DME company. ? ?Please also make sure, the patient has a follow-up appointment with me in about 10 weeks from the setup date, thanks. May see one of our nurse practitioners if needed for proper timing of the FU appointment.  ?Her EKG showed  frequent PVCs/extra beats. I recommend she get a full EKG through PCP or her cardiologist if she has palpitations. She is followed by [facility]. ? ?Please fax or rout report to the referring provider. Thanks,  ? ?Star Age, MD, PhD ?[facility])  ? ? ?

## 2021-06-14 NOTE — Telephone Encounter (Signed)
Results forwarded to Dr Crissie Sickles & Pablo Lawrence NP.  ?

## 2021-06-14 NOTE — Telephone Encounter (Signed)
Received a call back from [place] at [place] who confirmed they do take pt's insurance. Referral for autoPAP therapy has been faxed to [facility]. Received a receipt of confirmation. ? ?Letter mailed to pt.  ? ?

## 2021-06-27 ENCOUNTER — Ambulatory Visit: Payer: Medicare Other | Admitting: Neurology

## 2021-06-28 ENCOUNTER — Ambulatory Visit: Payer: Medicare Other | Admitting: Neurology

## 2021-07-01 DIAGNOSIS — F5105 Insomnia due to other mental disorder: Secondary | ICD-10-CM | POA: Insufficient documentation

## 2021-07-01 DIAGNOSIS — Z0000 Encounter for general adult medical examination without abnormal findings: Secondary | ICD-10-CM | POA: Diagnosis not present

## 2021-07-01 DIAGNOSIS — M5416 Radiculopathy, lumbar region: Secondary | ICD-10-CM | POA: Insufficient documentation

## 2021-07-01 DIAGNOSIS — G894 Chronic pain syndrome: Secondary | ICD-10-CM | POA: Diagnosis not present

## 2021-07-01 DIAGNOSIS — G4733 Obstructive sleep apnea (adult) (pediatric): Secondary | ICD-10-CM | POA: Diagnosis not present

## 2021-07-01 DIAGNOSIS — E039 Hypothyroidism, unspecified: Secondary | ICD-10-CM | POA: Diagnosis not present

## 2021-07-01 DIAGNOSIS — C50911 Malignant neoplasm of unspecified site of right female breast: Secondary | ICD-10-CM | POA: Diagnosis not present

## 2021-07-01 DIAGNOSIS — N1831 Chronic kidney disease, stage 3a: Secondary | ICD-10-CM | POA: Diagnosis not present

## 2021-07-01 DIAGNOSIS — F99 Mental disorder, not otherwise specified: Secondary | ICD-10-CM | POA: Insufficient documentation

## 2021-07-05 DIAGNOSIS — N1831 Chronic kidney disease, stage 3a: Secondary | ICD-10-CM | POA: Diagnosis not present

## 2021-07-05 DIAGNOSIS — Z950 Presence of cardiac pacemaker: Secondary | ICD-10-CM | POA: Diagnosis not present

## 2021-07-05 DIAGNOSIS — Z1589 Genetic susceptibility to other disease: Secondary | ICD-10-CM | POA: Diagnosis not present

## 2021-07-05 DIAGNOSIS — I4891 Unspecified atrial fibrillation: Secondary | ICD-10-CM | POA: Diagnosis not present

## 2021-07-05 DIAGNOSIS — C755 Malignant neoplasm of aortic body and other paraganglia: Secondary | ICD-10-CM | POA: Diagnosis not present

## 2021-07-05 DIAGNOSIS — C50911 Malignant neoplasm of unspecified site of right female breast: Secondary | ICD-10-CM | POA: Diagnosis not present

## 2021-07-07 ENCOUNTER — Ambulatory Visit: Payer: Medicare Other | Admitting: Neurology

## 2021-07-12 ENCOUNTER — Encounter: Payer: Self-pay | Admitting: Neurology

## 2021-07-12 ENCOUNTER — Ambulatory Visit (INDEPENDENT_AMBULATORY_CARE_PROVIDER_SITE_OTHER): Payer: Medicare Other | Admitting: Neurology

## 2021-07-12 VITALS — BP 135/83 | HR 72 | Ht 63.0 in | Wt 170.5 lb

## 2021-07-12 DIAGNOSIS — R269 Unspecified abnormalities of gait and mobility: Secondary | ICD-10-CM | POA: Diagnosis not present

## 2021-07-12 DIAGNOSIS — G7000 Myasthenia gravis without (acute) exacerbation: Secondary | ICD-10-CM | POA: Diagnosis not present

## 2021-07-12 MED ORDER — PYRIDOSTIGMINE BROMIDE 60 MG PO TABS: 30.0000 mg | ORAL_TABLET | Freq: Four times a day (QID) | ORAL | 3 refills | Status: DC

## 2021-07-12 MED ORDER — PREDNISONE 1 MG PO TABS: 5.0000 mg | ORAL_TABLET | Freq: Every day | ORAL | 3 refills | Status: DC

## 2021-07-12 MED ORDER — MYCOPHENOLATE MOFETIL 500 MG PO TABS: 500.0000 mg | ORAL_TABLET | Freq: Two times a day (BID) | ORAL | 3 refills | Status: DC

## 2021-07-12 NOTE — Progress Notes (Signed)
? ?Chief Complaint  ?Patient presents with  ? Follow-up  ?  Rm 13. Accompanied by daughter. ?Pt states her body will jump while laying still. She c/o right leg pain.  ? ? ? ? ?ASSESSMENT AND PLAN ? ?[name] is a 81 y.o. female   ?Long history of myasthenia gravis ? Ocular predominant, ? 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 Discussed with patient, decided to keep CellCept 500 mg twice a day, slow tapering down prednisone, 1 mg decrement every 1 month, to stay at 5 mg daily ? Return to clinic with nurse practitioner in 6 months ? ?DIAGNOSTIC DATA (LABS, IMAGING, TESTING) ?- I reviewed patient records, labs, notes, testing and imaging myself where available. ? ?Laboratory evaluation from [place] in April 2023, creatinine 1.37, calcium was elevated 10.4, kidney function normal TSH, mild elevated free T41.88, CBC showed normal hemoglobin of 14.0, ? ?MEDICAL HISTORY: ? ?[name], accompanied by her daughter, follow-up for myasthenia gravis, she was a patient of Dr. Jannifer Franklin. ? ?I reviewed and summarized the referring note. PMHX. ?A fib, Eliquis ?Pacemaker ?HTN ?HLD ?Hypothyroidism ?Chronic low back, neck pain, taking Percocet 10/325 x 6 tabs a week by PCP ?Paraganglioma, retroperitoneal s/p resection in 2013, ?Right breast cancer, partial lobectomy. ? ?Patient had severe arthritis, complains of multiple joints pain, significant chronic neck and low back pain, is getting scheduled narcotics through her primary care physician ? ?She was diagnosed of myasthenia gravis more than 20 years ago, in 1990s she presented with double vision, myasthenia gravis symptoms are mainly persistent binocular diplopia, she never had significant swallowing difficulties/breathing difficulty, she has arm and lower extremity pain she contributed to her severe arthritis, musculoskeletal pain ? ?Dr. Jannifer Franklin took  over her management of myasthenia gravis since February 2019 after her previous neurology retired, over the years, she has been treated with stable dose of CellCept 500 mg twice a day, on relatively low-dose of prednisone 10 mg for a long time, last visit in October 2022, she began to tapering down prednisone dose at a very slow pace, she worried about the long-term side effect of prednisone, and was not sure that it has helped her symptoms, she is a retired Equities trader ? ?She has severe bilateral cataract, status post surgery and lens implant of left eye, left eye vision is 20/20, she purposely delayed her right cataract surgery because the visual acuity difference make double vision less obvious, previously she has to wear eye patch to suppress 1 vision loss ? ? ?PHYSICAL EXAM: ?  ?Vitals:  ? 07/12/21 1127  ?BP: 135/83  ?Pulse: 72  ?Weight: 170 lb 8 oz (77.3 kg)  ?Height: '5\' 3"'$  (1.6 m)  ? ?Not recorded ?  ? ? ?Body mass index is 30.2 kg/m?. ? ?PHYSICAL EXAMNIATION: ? ?Gen: NAD, conversant, well nourised, well groomed                     ?Cardiovascular: Regular rate rhythm, no peripheral edema, warm, nontender. ?Eyes: Conjunctivae clear without exudates or hemorrhage ?Neck: Supple, no carotid bruits. ?Pulmonary: Clear to auscultation bilaterally  ? ?NEUROLOGICAL EXAM: ? ?MENTAL STATUS: ?Speech/cognition: Awake, alert, oriented to history taking care of conversation ? ?CRANIAL NERVES: ?CN II: Pupils round reactive to light ?CN III, IV, VI: Right inferior exophoria, left superior/exophoria ?CN V: Facial sensation is intact to light touch ?CN VII: Face is symmetric with  normal eye closure  ?CN VIII: Hearing is normal to causal conversation. ?CN IX, X: Phonation is normal. ?CN XI: Head turning and shoulder shrug are intact ? ?MOTOR: Difficult to perform full exertion motor examination due to multiple joints pain, spine pain, felt there was no significant bilateral upper and lower extremity proximal and distal  muscle weakness  ? ?REFLEXES: ?Reflexes are hypoactive and symmetric at the biceps, triceps, knees, and ankles. Plantar responses are flexor. ? ?SENSORY: ?Intact to light touch vibratory sensation. ? ?COORDINATION: ?There is no trunk or limb dysmetria noted. ? ?GAIT/STANCE: She can get up with multiple attempts arm crossed, wide-based, cautious antalgic gait ? ?REVIEW OF SYSTEMS:  ?Full 14 system review of systems performed and notable only for as above ?All other review of systems were negative. ? ? ?ALLERGIES: ?Allergies  ?Allergen Reactions  ? Nitrofurantoin Macrocrystal Nausea And Vomiting  ? Ampicillin Rash  ?  Did it involve swelling of the face/tongue/throat, SOB, or low BP? No ?Did it involve sudden or severe rash/hives, skin peeling, or any reaction on the inside of your mouth or nose? No ?Did you need to seek medical attention at a hospital or doctor's office? No ?When did it last happen? 20 years ago     ?If all above answers are ?NO?, may proceed with cephalosporin use. ?  ? Avocado Diarrhea and Nausea And Vomiting  ? Biaxin [Clarithromycin] Rash  ? Doxycycline Monohydrate Nausea And Vomiting  ? Moxifloxacin   ?  Contraindication myasthenia gravis  ? Sensi-Care Protective Barrier [Petrolatum-Zinc Oxide] Rash  ? Tetracyclines & Related Nausea And Vomiting  ? ? ?HOME MEDICATIONS: ?Current Outpatient Medications  ?Medication Sig Dispense Refill  ? ALPRAZolam (XANAX) 0.5 MG tablet Take 1 mg by mouth 2 (two) times daily. Takes as needed.    ? anastrozole (ARIMIDEX) 1 MG tablet Take 1 tablet (1 mg total) by mouth daily. 90 tablet 3  ? apixaban (ELIQUIS) 5 MG TABS tablet TAKE (1) TABLET BY MOUTH TWICE DAILY. 60 tablet 5  ? Artificial Tear Solution (SOOTHE XP OP) Place 1 drop into both eyes 4 (four) times daily.    ? atenolol (TENORMIN) 25 MG tablet TAKE (1) TABLET BYMOUTH ONCE DAILY. MAY TAKE AN ADDITIONAL TABLET IF SYSTOLIC OVER 259 OR SEVERE PALPITATIONS. 100 tablet 0  ? atorvastatin (LIPITOR) 80 MG tablet  Take 80 mg by mouth daily.    ? B Complex-C (B-COMPLEX WITH VITAMIN C) tablet Take 1 tablet by mouth daily with supper.     ? baclofen (LIORESAL) 10 MG tablet Take 0.5 tablets (5 mg total) by mouth at bedtime as needed for muscle spasms. 15 each 3  ? Coenzyme Q10 (COQ10) 100 MG CAPS Take 100 mg by mouth daily.     ? diclofenac sodium (VOLTAREN) 1 % GEL Apply 4 g topically 4 (four) times daily as needed (for pain).     ? gabapentin (NEURONTIN) 300 MG capsule Take 1 capsule (300 mg total) by mouth 3 (three) times daily. (Patient taking differently: Take 400 mg by mouth 3 (three) times daily.)    ? lansoprazole (PREVACID) 30 MG capsule Take 1 capsule (30 mg total) by mouth daily before breakfast. TAKE (1) CAPSULE BY MOUTH TWICE DAILY. 90 capsule 3  ? levothyroxine (SYNTHROID) 100 MCG tablet Take 100 mcg by mouth daily.    ? lidocaine (LIDODERM) 5 % Place 1-3 patches onto the skin daily as needed (for pain). Remove & Discard patch within 12 hours or as directed by MD for  pain    ? mycophenolate (CELLCEPT) 500 MG tablet TAKE (1) TABLET BY MOUTH TWICE DAILY. 180 tablet 3  ? OVER THE COUNTER MEDICATION Calcium and vit D one bid    ? oxyCODONE-acetaminophen (PERCOCET) 10-325 MG tablet Take 1 tablet by mouth every 4 (four) hours as needed (pain).    ? polyethylene glycol powder (GLYCOLAX/MIRALAX) 17 GM/SCOOP powder Take 8.5 g by mouth daily. (Patient taking differently: Take 8.5 g by mouth 2 (two) times daily.)    ? predniSONE (DELTASONE) 1 MG tablet Take 4 tablets (4 mg total) by mouth daily with breakfast. 360 tablet 1  ? predniSONE (DELTASONE) 5 MG tablet Take 1 tablet (5 mg total) by mouth daily with breakfast. 2 tablets (Patient taking differently: Take 5 mg by mouth daily with breakfast.) 90 tablet 3  ? pyridostigmine (MESTINON) 60 MG tablet TAKE (1/2) TABLET BY MOUTH FOUR TIMES DAILY. 180 tablet 2  ? SUMAtriptan (IMITREX) 50 MG tablet Take 50 mg by mouth every 2 (two) hours as needed. For migraines    ? Terbinafine 1  % GEL Apply 1 application topically 2 (two) times daily as needed (toe nail fungus).     ? traZODone (DESYREL) 50 MG tablet TAKE (1) TABLET BY MOUTH AT BEDTIME. 90 tablet 2  ? ?No current facility-adminis

## 2021-07-25 ENCOUNTER — Telehealth: Payer: Self-pay | Admitting: Cardiovascular Disease

## 2021-07-25 NOTE — Telephone Encounter (Signed)
?  Pt c/o swelling: STAT is pt has developed SOB within 24 hours ? ?If swelling, where is the swelling located? Both legs pitting edema  ? ?How much weight have you gained and in what time span?  ? ?Have you gained 3 pounds in a day or 5 pounds in a week? Not sure ? ?Do you have a log of your daily weights (if so, list)?  ? ?Are you currently taking a fluid pill? No  ? ?Are you currently SOB? No  ? ?Have you traveled recently? No  ? ?Last night O2 level went down to 87% ?

## 2021-07-25 NOTE — Telephone Encounter (Signed)
Reports swelling in lower extremities for several months. Recently started productive cough with frothy sputum. Pitting edema in lower legs that got worse on yesterday. Reports SOB. Denies chest pain or dizziness. Does not weigh at home. Sleeps with HOB elevated all the time. Reports O2 Sat was 87 % last night. Has not checked BP or HR. Requesting prescription for diuretic. Did sound SOB on phone.  ?Discussed with Dr. Audie Box DOD and recommended ED evaluation. ? ?Patient informed and verbalized understanding of plan. ?Says O2 Sat 96-98% & HR 96 ? ?

## 2021-07-25 NOTE — Telephone Encounter (Signed)
Attempted to contact pt- phone busy.  ?

## 2021-07-26 ENCOUNTER — Ambulatory Visit (INDEPENDENT_AMBULATORY_CARE_PROVIDER_SITE_OTHER): Payer: Medicare Other | Admitting: Internal Medicine

## 2021-07-26 ENCOUNTER — Ambulatory Visit: Payer: Medicare Other | Admitting: Physician Assistant

## 2021-07-26 ENCOUNTER — Encounter (INDEPENDENT_AMBULATORY_CARE_PROVIDER_SITE_OTHER): Payer: Self-pay | Admitting: Internal Medicine

## 2021-07-26 VITALS — BP 123/64 | HR 65 | Temp 99.5°F | Ht 63.0 in | Wt 165.0 lb

## 2021-07-26 DIAGNOSIS — J029 Acute pharyngitis, unspecified: Secondary | ICD-10-CM | POA: Diagnosis not present

## 2021-07-26 DIAGNOSIS — K581 Irritable bowel syndrome with constipation: Secondary | ICD-10-CM | POA: Diagnosis not present

## 2021-07-26 DIAGNOSIS — K219 Gastro-esophageal reflux disease without esophagitis: Secondary | ICD-10-CM | POA: Diagnosis not present

## 2021-07-26 MED ORDER — PENICILLIN V POTASSIUM 500 MG PO TABS: 500.0000 mg | ORAL_TABLET | Freq: Four times a day (QID) | ORAL | 0 refills | Status: DC

## 2021-07-26 NOTE — Progress Notes (Addendum)
Presenting complaint; ? ?Follow-up for GERD and constipation. ? ?Database and subjective: ? ?Patient is 81 year old Caucasian female who is here for scheduled visit accompanied by her daughter [name].  She was last seen on 01/25/2021. ?She does not have any GI complaints but instead she is complaining of shortness of breath and leg swelling.  She is also having frothy sputum.  She states she took furosemide yesterday and noted improvement in her shortness of breath.  She called cardiology office late yesterday and she was advised to go to emergency room but she decided not to.  She says her breathing is much better.  She is not having any chest pain. ?She rarely has heartburn.  She denies dysphagia.  She says she has 3 large bowel movements daily.  Stool consistency ranges between formed and soft.  She denies abdominal pain melena or rectal bleeding. ?She also complains of sore throats and has low-grade fever. ?She takes oxycodone 3 times a day for back pain. ?She is on gabapentin for ? ?For neuropathy.  Using Voltaren gel on her toes.  She states she walks about an hour daily using her walker. ?She is using CPAP at night. ? ?Current Medications: ?Outpatient Encounter Medications as of 07/26/2021  ?Medication Sig  ? ALPRAZolam (XANAX) 0.5 MG tablet Take 1 mg by mouth 2 (two) times daily. Takes as needed.  ? anastrozole (ARIMIDEX) 1 MG tablet Take 1 tablet (1 mg total) by mouth daily.  ? apixaban (ELIQUIS) 5 MG TABS tablet TAKE (1) TABLET BY MOUTH TWICE DAILY.  ? Artificial Tear Solution (SOOTHE XP OP) Place 1 drop into both eyes 4 (four) times daily.  ? atenolol (TENORMIN) 25 MG tablet TAKE (1) TABLET BYMOUTH ONCE DAILY. MAY TAKE AN ADDITIONAL TABLET IF SYSTOLIC OVER 423 OR SEVERE PALPITATIONS.  ? atorvastatin (LIPITOR) 80 MG tablet Take 80 mg by mouth daily.  ? B Complex-C (B-COMPLEX WITH VITAMIN C) tablet Take 1 tablet by mouth daily with supper.   ? baclofen (LIORESAL) 10 MG tablet Take 0.5 tablets (5 mg total) by  mouth at bedtime as needed for muscle spasms.  ? Coenzyme Q10 (COQ10) 100 MG CAPS Take 100 mg by mouth daily.   ? diclofenac sodium (VOLTAREN) 1 % GEL Apply 4 g topically 4 (four) times daily as needed (for pain).   ? gabapentin (NEURONTIN) 300 MG capsule Take 1 capsule (300 mg total) by mouth 3 (three) times daily. (Patient taking differently: Take 400 mg by mouth 3 (three) times daily.)  ? lansoprazole (PREVACID) 30 MG capsule Take 1 capsule (30 mg total) by mouth daily before breakfast. TAKE (1) CAPSULE BY MOUTH TWICE DAILY.  ? levothyroxine (SYNTHROID) 100 MCG tablet Take 100 mcg by mouth daily.  ? lidocaine (LIDODERM) 5 % Place 1-3 patches onto the skin daily as needed (for pain). Remove & Discard patch within 12 hours or as directed by MD for pain  ? mycophenolate (CELLCEPT) 500 MG tablet Take 1 tablet (500 mg total) by mouth 2 (two) times daily.  ? OVER THE COUNTER MEDICATION Calcium and vit D one bid  ? oxyCODONE-acetaminophen (PERCOCET) 10-325 MG tablet Take 1 tablet by mouth every 4 (four) hours as needed (pain).  ? polyethylene glycol powder (GLYCOLAX/MIRALAX) 17 GM/SCOOP powder Take 8.5 g by mouth daily. (Patient taking differently: Take 8.5 g by mouth 2 (two) times daily.)  ? predniSONE (DELTASONE) 1 MG tablet Take 5 tablets (5 mg total) by mouth daily with breakfast.  ? predniSONE (DELTASONE) 5 MG tablet Take  1 tablet (5 mg total) by mouth daily with breakfast. 2 tablets (Patient taking differently: Take 5 mg by mouth daily with breakfast.)  ? pyridostigmine (MESTINON) 60 MG tablet Take 0.5 tablets (30 mg total) by mouth 4 (four) times daily.  ? SUMAtriptan (IMITREX) 50 MG tablet Take 50 mg by mouth every 2 (two) hours as needed. For migraines  ? Terbinafine 1 % GEL Apply 1 application topically 2 (two) times daily as needed (toe nail fungus).   ? traZODone (DESYREL) 50 MG tablet TAKE (1) TABLET BY MOUTH AT BEDTIME.  ? ?No facility-administered encounter medications on file as of 07/26/2021.   ? ? ? ?Objective: ?Blood pressure 123/64, pulse 65, temperature 99.5 ?F (37.5 ?C), temperature source Oral, height _0  (1.6 m), weight 165 lb (74.8 kg). ?Patient is alert and in no acute distress. ?Conjunctiva is pink. Sclera is nonicteric ?Oropharyngeal mucosa is normal. ?No neck masses or thyromegaly noted. ?Cardiac exam with regular rhythm normal S1 and S2. No murmur or gallop noted. ?Lungs are clear to auscultation. ?Abdomen is symmetrical soft and nontender with organomegaly or masses. ?No LE edema or clubbing noted. ? ?Labs/studies Results: ? ? ? ?  Latest Ref Rng & Units 12/28/2020  ? 12:03 PM 03/16/2020  ?  3:50 PM 12/01/2019  ? 12:20 PM  ?CBC  ?WBC 3.4 - 10.8 x10E3/uL 8.4   8.3   12.4    ?Hemoglobin 11.1 - 15.9 g/dL 13.5   12.9   13.9    ?Hematocrit 34.0 - 46.6 % 40.9   40.5   41.7    ?Platelets 150 - 450 x10E3/uL 164   175   157    ?  ? ?  Latest Ref Rng & Units 12/28/2020  ? 12:03 PM 03/16/2020  ?  3:50 PM 12/01/2019  ? 12:20 PM  ?CMP  ?Glucose 70 - 99 mg/dL 90   109   100    ?BUN 8 - 27 mg/dL _1 ?Creatinine 0.57 - 1.00 mg/dL 1.20   1.08   1.06    ?Sodium 134 - 144 mmol/L 142   138   142    ?Potassium 3.5 - 5.2 mmol/L 4.5   4.3   4.3    ?Chloride 96 - 106 mmol/L 103   101   105    ?CO2 20 - 29 mmol/L _2 ?Calcium 8.7 - 10.3 mg/dL 10.3   9.7   9.6    ?Total Protein 6.0 - 8.5 g/dL 6.3   6.2   6.2    ?Total Bilirubin 0.0 - 1.2 mg/dL 0.4   0.4   0.4    ?Alkaline Phos 44 - 121 IU/L 50   57   81    ?AST 0 - 40 IU/L 22   18   61    ?ALT 0 - 32 IU/L 16   18   86    ?  ? ?  Latest Ref Rng & Units 12/28/2020  ? 12:03 PM 03/16/2020  ?  3:50 PM 12/01/2019  ? 12:20 PM  ?Hepatic Function  ?Total Protein 6.0 - 8.5 g/dL 6.3   6.2   6.2    ?Albumin 3.6 - 4.6 g/dL 4.2   4.1   4.0    ?AST 0 - 40 IU/L 22   18   61    ?ALT 0 - 32 IU/L 16   18  86    ?Alk Phosphatase 44 - 121 IU/L 50   57   81    ?Total Bilirubin 0.0 - 1.2 mg/dL 0.4   0.4   0.4    ?  ?Above lab data reviewed. ? ?Lab data from  07/01/2021.  Copy provided by the patient. ?Glucose 109, BUN 17, creatinine 1.37 ?Serum sodium 139, potassium 4.4, chloride 100, CO2 25 ?Serum calcium 10.4 ?Total protein 6.8 and albumin 4.5 ?Bilirubin 0.8, AP 59, AST 19, ALT 12. ? ?Assessment: ? ?#1.  Chronic GERD.  She is doing well with dietary measures and single dose PPI.  Therefore we will trial her on her PPI every other day.  Will not make dose change unless it is established that heartburn is well controlled with therapy. ? ?#2.  Constipation.  She is doing well with polyethylene glycol. ? ?#2.  Sore throat.  She has low-grade fever.  She therefore will be treated with an antibiotic.  She states she has taken penicillin in the past and has never had any side effects. ? ? ?Plan: ? ?Pen-VK 500 mg by mouth 4 times a day for 7 days. ?Decrease lansoprazole to 30 mg by mouth every other day.  If she does well for 2 months she can start using it on as-needed basis. ?Continue polyethylene glycol as before. ?Patient advised to follow-up with Ms. Jannette Fogo NP regarding her shortness of breath. ?Office visit in 6 months ? ? ? ? ? ? ?

## 2021-07-26 NOTE — Progress Notes (Deleted)
Cardiology Office Note    Date:  07/26/2021   ID:  CARLOS QUACKENBUSH, DOB Dec 06, 1940, MRN [id number]   PCP:  Pablo Lawrence, NP   [facility]  Cardiologist:  Kate Sable, MD (Inactive) *** Advanced Practice Provider:  No care team member to display Electrophysiologist:  None   [phone]   No chief complaint on file.   History of Present Illness:  ANICIA LEUTHOLD is a 81 y.o. female  with past medical history of paroxysmal atrial fibrillation (on Eliquis), tachy-brady syndrome (s/p Medtronic PPM placement in 06/2017), HTN, paraganglioma, and myasthenia gravis, chronic dyspnea on exertion and cath 11/18/18 normal LM, RCA, LCx and LAD with first diagonal containing 40% stenosis.  LV function and hemodynamics were normal with normal pulmonary artery pressures as well. Symptoms were thought to not be consistent with a cardiac origin.  . Echo 06/24/19 normal LVEF, grade 1 DD,   Patient saw Dr. Lovena Le 09/2020 and was doing well. Last pacer check 04/2021 normal.   Patient called in yesterday with worsening SOB and O2 sats 87%, edema requesting diuretic.Instructed to go to ED by Dr. Audie Box but she didn't.  Past Medical History:  Diagnosis Date   Anemia    Atrial fibrillation ([place])    Breast cancer ([place]) 09/2019   right breast    Chronic back pain    Chronic nausea    Complication of anesthesia    hypotension, elevated heart rate and oxygenation desaturation   Depression with anxiety    Diverticulosis    Dyspnea    due to myasthenia gravis   Essential hypertension    GERD (gastroesophageal reflux disease)    History of pneumonia    Hypothyroidism    IBS (irritable colon syndrome)    LVH (left ventricular hypertrophy)    Migraines    Mixed hyperlipidemia    Obesity    Ocular myasthenia gravis (HCC)    Osteoarthritis of knee    Pacemaker    PAF (paroxysmal atrial fibrillation) (H[facility])    chads2vasc score is at least 4   Paraganglioma ([place])    Resection  02/2012 (retroperitoneal)   Paraganglioma, 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   Presence of permanent cardiac pacemaker 07/18/2017   Renal insufficiency    Skin cancer     Past Surgical History:  Procedure Laterality Date   ABDOMINAL HYSTERECTOMY     BREAST BIOPSY Right 09/2019   BREAST LUMPECTOMY Right 10/28/2019   CARDIAC CATHETERIZATION     CHOLECYSTECTOMY     COLONOSCOPY  08/24/2011   Procedure: COLONOSCOPY;  Surgeon: Rogene Houston, MD;  Location[facility];  Service: Endoscopy;  Laterality: N/A;  1200   CT guided biopsy  01/16/12   GIVENS CAPSULE STUDY  01/29/2012   Procedure: GIVENS CAPSULE STUDY;  Surgeon: Rogene Houston, MD;  Location: [facility];  Service: Endoscopy;  Laterality: N/A;  [place]     LAPAROTOMY  03/01/2012   Procedure: EXPLORATORY LAPAROTOMY;  Surgeon: Earnstine Regal, MD;  Location: [facility];  Service: General;  Laterality: N/A;  Exploratory Laparotomy ,Resection Retroperitoneal Mass   MASTECTOMY, PARTIAL Right 10/28/2019   Procedure: RIGHT BREAST PARTIAL MASTECTOMY;  Surgeon: Armandina Gemma, MD;  Location: [facility];  Service: General;  Laterality: Right;   NASAL SINUS SURGERY     30 yrs ago   PACEMAKER IMPLANT N/A 07/18/2017   Procedure: PACEMAKER IMPLANT;  Surgeon: Evans Lance, MD;  Location: [facility];  Service: Cardiovascular;  Laterality: N/A;   RIGHT/LEFT HEART CATH AND CORONARY ANGIOGRAPHY N/A 11/18/2018   Procedure: RIGHT/LEFT HEART CATH AND CORONARY ANGIOGRAPHY;  Surgeon: Belva Crome, MD;  Location: [facility];  Service: Cardiovascular;  Laterality: N/A;   TOTAL KNEE ARTHROPLASTY Bilateral    2005 and 2011    Current Medications: No outpatient medications have been marked as taking for the 07/26/21 encounter (Appointment) with Imogene Burn, PA-C.     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  Retired Quarry manager.   Caffeine use: few ounces of coke about a every day, hot tea    Right handed   Social Determinants of Health   Financial Resource Strain: Not on file  Food Insecurity: Not on file  Transportation Needs: Not on file  Physical Activity: Not on file  Stress: Not on file  Social Connections: Not on file     Family History:  The patient's ***family history includes Inflammatory bowel disease in her maternal grandmother; Sleep apnea in her daughter, daughter, and son.   ROS:   Please see the history of present illness.    ROS All other systems reviewed and are negative.   PHYSICAL EXAM:   VS:  There were no vitals taken for this visit.  Physical Exam  GEN: Well nourished, well developed, in no acute distress  HEENT: normal  Neck: no JVD, carotid bruits, or masses Cardiac:RRR; no murmurs, rubs, or gallops  Respiratory:  clear to auscultation bilaterally, normal work of breathing GI: soft, nontender, nondistended, + BS Ext: without cyanosis, clubbing, or edema, Good distal pulses bilaterally MS: no deformity or atrophy  Skin: warm and dry, no rash Neuro:  Alert and Oriented x 3, Strength and sensation are intact Psych: euthymic mood, full affect  Wt Readings from Last 3 Encounters:  07/12/21 170 lb 8 oz (77.3 kg)   03/31/21 167 lb (75.8 kg)  01/25/21 168 lb (76.2 kg)      Studies/Labs Reviewed:   EKG:  EKG is*** ordered today.  The ekg ordered today demonstrates ***  Recent Labs: 12/28/2020: ALT 16; BUN 18; Creatinine, Ser 1.20; Hemoglobin 13.5; Platelets 164; Potassium 4.5; Sodium 142   Lipid Panel No results found for: CHOL, TRIG, HDL, CHOLHDL, VLDL, LDLCALC, LDLDIRECT  Additional studies/ records that were reviewed today include:    Echo 06/24/19 IMPRESSIONS     1. Left ventricular ejection fraction, by estimation, is 60 to 65%. The  left ventricle has normal function. The left ventricle has no regional  wall motion abnormalities. There is mild left ventricular hypertrophy.  Left ventricular diastolic parameters  are consistent with Grade I diastolic dysfunction (impaired relaxation).  2. Right ventricular systolic function is normal. The right ventricular  size is normal. There is normal pulmonary artery systolic pressure.   3. The mitral valve is normal in structure. Trivial mitral valve  regurgitation. No evidence of mitral stenosis.   4. The aortic valve is tricuspid. Aortic valve regurgitation is not  visualized. No aortic stenosis is present.   5. The inferior vena cava is normal in size with greater than 50%  respiratory variability, suggesting right atrial pressure of 3 mmHg.   FINDINGS   Left Ventricle: Left ventricular ejection fraction, by estimation, is 60  to 65%. The left ventricle has normal function. The left ventricle has no  regional wall motion abnormalities. The left ventricular internal cavity  size was normal in size. There is   mild left ventricular hypertrophy. Left ventricular diastolic parameters  are consistent with Grade I diastolic dysfunction (impaired relaxation).   Right Ventricle: The right ventricular size is normal. No increase in  right ventricular wall thickness. Right ventricular systolic function is  normal. There is normal pulmonary artery  systolic pressure. The tricuspid  regurgitant velocity is 1.53 m/s, and   with an assumed right atrial pressure of 10 mmHg, the estimated right  ventricular systolic pressure is 94.1 mmHg.   Left Atrium: Left atrial size was normal in size.   Right Atrium: Right atrial size was normal in size.   Pericardium: There is no evidence of pericardial effusion.   Mitral Valve: The mitral valve is normal in structure. Trivial mitral  valve regurgitation. No evidence of mitral valve stenosis.   Tricuspid Valve: The tricuspid valve is normal in structure. Tricuspid  valve regurgitation is trivial. No evidence of tricuspid stenosis.   Aortic Valve: The aortic valve is tricuspid. . There is mild thickening  and mild calcification of the aortic valve. Aortic valve regurgitation is  not visualized. No aortic stenosis is present. Mild aortic valve annular  calcification. There is mild  thickening of the aortic valve. There is mild calcification of the aortic  valve. Aortic valve mean gradient measures 3.5 mmHg. Aortic valve peak  gradient measures 1.7 mmHg. Aortic valve area, by VTI measures 2.40 cm.   Pulmonic Valve: The pulmonic valve was not well visualized. Pulmonic valve  regurgitation is not visualized. No evidence of pulmonic stenosis.   Aorta: The aortic root is normal in size and structure.   Venous: The inferior vena cava is normal in size with greater than 50%  respiratory variability, suggesting right atrial pressure of 3 mmHg.   IAS/Shunts: No atrial level shunt detected by color flow Doppler.   Additional Comments: A pacer wire is visualized.       Risk Assessment/Calculations:   {Does this patient have ATRIAL FIBRILLATION?[phone]}     ASSESSMENT:    No diagnosis found.   PLAN:  In order of problems listed above:  Dyspnea on exertion cath without stenosis 10/2018, echo 06/24/19 normal LV function, pacer function stable 04/2019-symptoms improved with steroids. F/u  with Dr. [name] in 2-3 months   Sinus node dysfucntion S/P pacemaker followed by Dr. Lovena Le   PAF on eliquis   HTN BP well controlled   Myasthenia gravis   Shared Decision Making/Informed Consent   {Are you ordering a CV Procedure (e.g. stress test, cath, DCCV, TEE, etc)?   Press F2        :[id number]}    Medication Adjustments/Labs and Tests Ordered: Current medicines are reviewed at length with the patient today.  Concerns regarding medicines  are outlined above.  Medication changes, Labs and Tests ordered today are listed in the Patient Instructions below. There are no Patient Instructions on file for this visit.   Sumner Boast, PA-C  07/26/2021 9:52 AM    [facility] [address], [place], [place]  [address] Phone: [phone]; Fax: [phone]

## 2021-07-26 NOTE — Patient Instructions (Signed)
If you experience any side effect with Penicillin stop the medication and call office. ?Try Lansoprazole every other foe two months.If you do well then take use it on as needed basis. ?Follow-up with Ms. Kari Baars regarding shortness of breath and lower extremity edema. ?

## 2021-07-27 NOTE — Telephone Encounter (Signed)
Will have to address with Dr. Laural Golden, as note from yesterday is still incomplete. ?

## 2021-07-29 ENCOUNTER — Other Ambulatory Visit (HOSPITAL_COMMUNITY): Payer: Self-pay | Admitting: Adult Health Nurse Practitioner

## 2021-07-29 DIAGNOSIS — R600 Localized edema: Secondary | ICD-10-CM | POA: Diagnosis not present

## 2021-07-31 DIAGNOSIS — G4733 Obstructive sleep apnea (adult) (pediatric): Secondary | ICD-10-CM | POA: Diagnosis not present

## 2021-08-01 NOTE — Progress Notes (Signed)
Patient Care Team: Pablo Lawrence, NP as PCP - General (Adult Health Nurse Practitioner) Vaughan Basta, Rona Ravens, NP (Inactive) as Nurse Practitioner (Internal Medicine) Nicholas Lose, MD as Consulting Physician (Hematology and Oncology) Armandina Gemma, MD as Consulting Physician (General Surgery) Jarome Matin, MD as Consulting Physician (Dermatology)  DIAGNOSIS:  Encounter Diagnosis  Name Primary?   Malignant neoplasm of upper-outer quadrant of right breast in female, estrogen receptor positive ([place])     SUMMARY OF ONCOLOGIC HISTORY: Oncology History Overview Note  Ohlman Gene Abnormality Annual 24 hr urine collection for VMA's and catecholamines    Paraganglioma, malignant ([place])  01/01/2012 Imaging   CT abdomen/pelvis no acute findings identified within the abdomen/pelvis. Slight increase in size of enlarge and enhancing periaortic LN    01/10/2012 PET scan   Intensely hypermetabolic solitary L periaortic LN is concerning for a lymphoproliferative process vs a solitary metastasis. Diffuse intensely hypermetabolic thyroid activity is most consistent with a benign thyroiditis     01/16/2012 Initial Biopsy   IR CT guided biopsy of L para aortic lymph node    01/16/2012 Pathology Results   Low grade neoplasm with neuroendocrine differentiation. DDX includes paraganglioma, pheochromocytoma, and carcinoid    01/30/2012 Imaging   US soft tissue head/neck diffusely enlarged hypervascular nodular thyroid gland compatible with thyroiditis    03/01/2012 Surgery   Exploratory laparotomy, resection of retroperitoneal neuroendocrine tumor 3 x 2 x 2 cm with Dr. Armandina Gemma    03/01/2012 Pathology Results   Paraganglioma with associated fibrosis 2.5 cm, negative margins    04/17/2014 PET scan   There is no evidence for residual or recurrent hypermetabolic tumor or adenopathy. 2. Diffusely increased radiotracer uptake throughout both lobes of the thyroid gland. Correlation with  patient's TSH is recommended.    09/10/2015 Imaging   MRI neck with/without contrast at [facility] No neck mass or LAD. Likely mildly asymmetric lingual tonsil tissue in the L vallecular. 1.3 cm likely calcified L thyroid nodule.     10/08/2015 Imaging   CT C/A/P No evidence for residual or recurrent tumor or adenopathy.    06/15/2016 Imaging   MRI brain- No acute abnormality.  Negative for metastatic disease   Several small subcortical white matter hyperintensities have developed since 2013. This is most likely related to chronic microvascular ischemia.   06/15/2016 Imaging   CT CAP-   1. No evidence of thoracic metastasis. 1. No evidence of metastatic disease in the abdomen pelvis. 2. No periaortic adenopathy. 3. Sigmoid diverticulosis.  No diverticulitis.   Malignant neoplasm of upper-outer quadrant of right breast in female, estrogen receptor positive ([place])  10/14/2019 Cancer Staging   Staging form: Breast, AJCC 8th Edition - Clinical stage from 10/14/2019: Stage IA (cT1b, cN0, cM0, G2, ER+, PR-, HER2-)   10/22/2019 Initial Diagnosis   Screening mammogram showed a right breast mass. Diagnostic mammogram and US showed a 1.0cm right breast mass at the 10 o'clock position, no right axillary adenopathy. Biopsy showed IDC with DCIS, grade 2, HER-2 equivocal by IHC, negative by FISH, ER+ 95%, PR- 0%, Ki67 2%.   10/28/2019 Surgery   Right lumpectomy ([place]) ([id number]): IDC, 1.4cm, grade 2,  with intermediate grade DCIS. Clear margins. No regional lymph nodes were examined. ER+, PR-, HER-2 -   10/28/2019 Cancer Staging   Staging form: Breast, AJCC 8th Edition - Pathologic stage from 10/28/2019: Stage IA (pT1c, pN0, cM0, G2, ER+, PR-, HER2-)   10/2019 - 10/2024 Anti-estrogen oral therapy   Anastrozole     CHIEF COMPLIANT:  Follow-up of right breast cancer on anastrozole   INTERVAL HISTORY: [name] is a 81 y.o. with above-mentioned history of right breast cancer. She presents to  the clinic today for a follow-up. Denies hot flashes. States that she is retaining fluid. Overall she is tolerating the anastrozole. Complains of some joint stiffness.    ALLERGIES:  is allergic to nitrofurantoin macrocrystal, ampicillin, avocado, biaxin [clarithromycin], doxycycline monohydrate, moxifloxacin, sensi-care protective barrier [petrolatum-zinc oxide], and tetracyclines & related.  MEDICATIONS:  Current Outpatient Medications  Medication Sig Dispense Refill   furosemide (LASIX) 20 MG tablet Take 1 tablet (20 mg total) by mouth as needed. 30 tablet 0   potassium chloride SA (KLOR-CON M) 20 MEQ tablet Take 1 tablet (20 mEq total) by mouth daily. 30 tablet 0   ALPRAZolam (XANAX) 0.5 MG tablet Take 1 mg by mouth 2 (two) times daily. Takes as needed.     anastrozole (ARIMIDEX) 1 MG tablet TAKE (1) TABLET BY MOUTH ONCE DAILY. 90 tablet 3   apixaban (ELIQUIS) 5 MG TABS tablet TAKE (1) TABLET BY MOUTH TWICE DAILY. 60 tablet 5   Artificial Tear Solution (SOOTHE XP OP) Place 1 drop into both eyes 4 (four) times daily.     atenolol (TENORMIN) 25 MG tablet TAKE (1) TABLET BYMOUTH ONCE DAILY. MAY TAKE AN ADDITIONAL TABLET IF SYSTOLIC OVER 176 OR SEVERE PALPITATIONS. 100 tablet 0   atorvastatin (LIPITOR) 80 MG tablet Take 80 mg by mouth daily.     B Complex-C (B-COMPLEX WITH VITAMIN C) tablet Take 1 tablet by mouth daily with supper.      baclofen (LIORESAL) 10 MG tablet Take 0.5 tablets (5 mg total) by mouth at bedtime as needed for muscle spasms. 15 each 3   Coenzyme Q10 (COQ10) 100 MG CAPS Take 100 mg by mouth daily.      diclofenac sodium (VOLTAREN) 1 % GEL Apply 4 g topically 4 (four) times daily as needed (for pain).      gabapentin (NEURONTIN) 300 MG capsule Take 1 capsule (300 mg total) by mouth 3 (three) times daily. (Patient taking differently: Take 400 mg by mouth 3 (three) times daily.)     lansoprazole (PREVACID) 30 MG capsule Take 1 capsule (30 mg total) by mouth daily before  breakfast. TAKE (1) CAPSULE BY MOUTH TWICE DAILY. (Patient taking differently: Take 30 mg by mouth every evening.) 90 capsule 3   levothyroxine (SYNTHROID) 100 MCG tablet Take 100 mcg by mouth daily.     lidocaine (LIDODERM) 5 % Place 1-3 patches onto the skin daily as needed (for pain). Remove & Discard patch within 12 hours or as directed by MD for pain     mycophenolate (CELLCEPT) 500 MG tablet Take 1 tablet (500 mg total) by mouth 2 (two) times daily. 180 tablet 3   OVER THE COUNTER MEDICATION Calcium and vit D one bid     oxyCODONE-acetaminophen (PERCOCET) 10-325 MG tablet Take 1 tablet by mouth every 4 (four) hours as needed (pain).     penicillin v potassium (VEETID) 500 MG tablet Take 1 tablet (500 mg total) by mouth 4 (four) times daily. 28 tablet 0   polyethylene glycol powder (GLYCOLAX/MIRALAX) 17 GM/SCOOP powder Take 8.5 g by mouth daily. (Patient taking differently: Take 8.5 g by mouth 2 (two) times daily.)     predniSONE (DELTASONE) 1 MG tablet Take 5 tablets (5 mg total) by mouth daily with breakfast. 450 tablet 3   predniSONE (DELTASONE) 5 MG tablet Take 1 tablet (5  mg total) by mouth daily with breakfast. 2 tablets (Patient taking differently: Take 5 mg by mouth daily with breakfast.) 90 tablet 3   pyridostigmine (MESTINON) 60 MG tablet Take 0.5 tablets (30 mg total) by mouth 4 (four) times daily. 180 tablet 3   SUMAtriptan (IMITREX) 50 MG tablet Take 50 mg by mouth every 2 (two) hours as needed. For migraines     Terbinafine 1 % GEL Apply 1 application topically 2 (two) times daily as needed (toe nail fungus).      traZODone (DESYREL) 50 MG tablet TAKE (1) TABLET BY MOUTH AT BEDTIME. 90 tablet 2   No current facility-administered medications for this visit.    PHYSICAL EXAMINATION: ECOG PERFORMANCE STATUS: 1 - Symptomatic but completely ambulatory  Vitals:   08/09/21 1422  BP: 136/63  Pulse: 65  Resp: 18  Temp: 97.7 F (36.5 C)  SpO2: 99%   Filed Weights   08/09/21  1422  Weight: 172 lb 9.6 oz (78.3 kg)    BREAST: No palpable masses or nodules in either right or left breasts. No palpable axillary supraclavicular or infraclavicular adenopathy no breast tenderness or nipple discharge. (exam performed in the presence of a chaperone)  LABORATORY DATA:  I have reviewed the data as listed    Latest Ref Rng & Units 12/28/2020   12:03 PM 03/16/2020    3:50 PM 12/01/2019   12:20 PM  CMP  Glucose 70 - 99 mg/dL 90   109   100    BUN 8 - 27 mg/dL _0 Creatinine 0.57 - 1.00 mg/dL 1.20   1.08   1.06    Sodium 134 - 144 mmol/L 142   138   142    Potassium 3.5 - 5.2 mmol/L 4.5   4.3   4.3    Chloride 96 - 106 mmol/L 103   101   105    CO2 20 - 29 mmol/L _1 Calcium 8.7 - 10.3 mg/dL 10.3   9.7   9.6    Total Protein 6.0 - 8.5 g/dL 6.3   6.2   6.2    Total Bilirubin 0.0 - 1.2 mg/dL 0.4   0.4   0.4    Alkaline Phos 44 - 121 IU/L 50   57   81    AST 0 - 40 IU/L 22   18   61    ALT 0 - 32 IU/L 16   18   86      Lab Results  Component Value Date   WBC 8.4 12/28/2020   HGB 13.5 12/28/2020   HCT 40.9 12/28/2020   MCV 99 (H) 12/28/2020   PLT 164 12/28/2020   NEUTROABS 5.0 12/28/2020    ASSESSMENT & PLAN:  Malignant neoplasm of upper-outer quadrant of right breast in female, estrogen receptor positive ([place]) Right lumpectomy: 10/28/2019: Grade 2 IDC 1.4 cm, intermediate grade DCIS, margins negative, suspicious for LVI, ER 95%, PR 0%, HER-2 negative, Ki-67 2% T1c Nx stage Ia   Recommendation: Adjuvant antiestrogen therapy with anastrozole 1 mg daily x5 years Did not want to do radiation.   Anastrozole Toxicities: Denies any adverse effects to anastrozole therapy. Chronic arthritis and rotator cuff tendinitis in the right shoulder   Breast cancer Surveillance: 1. Breast Exam:08/09/21: Benign 2. Mammograms: 07/02/2020: Benign breast density category B   Lower extremity edema: This is after she worked painting her house  and stood on  her feet all day.  She had an echocardiogram which did not show any clear-cut LV or RV dysfunction.  She did have a dilated left atrium.  I gave her a prescription for Lasix to be used on an as-needed basis along with potassium.  She will call Dr. Lovena Le her cardiologist to discuss this further.   Return to clinic in 1 year for follow-up    No orders of the defined types were placed in this encounter.  The patient has a good understanding of the overall plan. she agrees with it. she will call with any problems that may develop before the next visit here. Total time spent: 30 mins including face to face time and time spent for planning, charting and co-ordination of care   Harriette Ohara, MD 08/09/21    I Gardiner Coins am scribing for Dr. Lindi Adie  I have reviewed the above documentation for accuracy and completeness, and I agree with the above.

## 2021-08-02 ENCOUNTER — Ambulatory Visit (INDEPENDENT_AMBULATORY_CARE_PROVIDER_SITE_OTHER): Payer: Medicare Other

## 2021-08-02 DIAGNOSIS — I495 Sick sinus syndrome: Secondary | ICD-10-CM

## 2021-08-02 LAB — CUP PACEART REMOTE DEVICE CHECK
Battery Remaining Longevity: 105 mo
Battery Voltage: 3 V
Brady Statistic AP VP Percent: 0.04 %
Brady Statistic AP VS Percent: 98.2 %
Brady Statistic AS VP Percent: 0 %
Brady Statistic AS VS Percent: 1.76 %
Brady Statistic RA Percent Paced: 99.01 %
Brady Statistic RV Percent Paced: 0.04 %
Date Time Interrogation Session: 20230516013939
Implantable Lead Implant Date: 20190501
Implantable Lead Implant Date: 20190501
Implantable Lead Location: 753859
Implantable Lead Location: 753860
Implantable Lead Model: 3830
Implantable Lead Model: 5076
Implantable Pulse Generator Implant Date: 20190501
Lead Channel Impedance Value: 342 Ohm
Lead Channel Impedance Value: 361 Ohm
Lead Channel Impedance Value: 361 Ohm
Lead Channel Impedance Value: 475 Ohm
Lead Channel Pacing Threshold Amplitude: 0.625 V
Lead Channel Pacing Threshold Amplitude: 0.625 V
Lead Channel Pacing Threshold Pulse Width: 0.4 ms
Lead Channel Pacing Threshold Pulse Width: 0.4 ms
Lead Channel Sensing Intrinsic Amplitude: 0.75 mV
Lead Channel Sensing Intrinsic Amplitude: 0.75 mV
Lead Channel Sensing Intrinsic Amplitude: 10.375 mV
Lead Channel Sensing Intrinsic Amplitude: 10.375 mV
Lead Channel Setting Pacing Amplitude: 2 V
Lead Channel Setting Pacing Amplitude: 2.5 V
Lead Channel Setting Pacing Pulse Width: 0.4 ms
Lead Channel Setting Sensing Sensitivity: 0.9 mV

## 2021-08-05 ENCOUNTER — Other Ambulatory Visit: Payer: Self-pay | Admitting: Hematology and Oncology

## 2021-08-05 DIAGNOSIS — E039 Hypothyroidism, unspecified: Secondary | ICD-10-CM | POA: Diagnosis not present

## 2021-08-05 DIAGNOSIS — N1831 Chronic kidney disease, stage 3a: Secondary | ICD-10-CM | POA: Diagnosis not present

## 2021-08-05 DIAGNOSIS — R600 Localized edema: Secondary | ICD-10-CM | POA: Diagnosis not present

## 2021-08-05 DIAGNOSIS — G7000 Myasthenia gravis without (acute) exacerbation: Secondary | ICD-10-CM | POA: Diagnosis not present

## 2021-08-08 ENCOUNTER — Ambulatory Visit (HOSPITAL_COMMUNITY)
Admission: RE | Admit: 2021-08-08 | Discharge: 2021-08-08 | Disposition: A | Discharge status: Home / Self Care | Payer: Medicare Other | Source: Ambulatory Visit | Attending: Adult Health Nurse Practitioner | Admitting: Adult Health Nurse Practitioner

## 2021-08-08 DIAGNOSIS — R600 Localized edema: Secondary | ICD-10-CM

## 2021-08-08 LAB — ECHOCARDIOGRAM COMPLETE
Area-P 1/2: 3.31 cm2
MV M vel: 5.04 m/s
MV Peak grad: 101.6 mmHg
Radius: 0.5 cm
S' Lateral: 2.6 cm

## 2021-08-08 NOTE — Progress Notes (Signed)
*  PRELIMINARY RESULTS* Echocardiogram 2D Echocardiogram has been performed.  [name] 08/08/2021, 3:03 PM

## 2021-08-09 ENCOUNTER — Inpatient Hospital Stay: Payer: Medicare Other | Attending: Hematology and Oncology | Admitting: Hematology and Oncology

## 2021-08-09 ENCOUNTER — Other Ambulatory Visit: Payer: Self-pay

## 2021-08-09 DIAGNOSIS — R600 Localized edema: Secondary | ICD-10-CM | POA: Diagnosis not present

## 2021-08-09 DIAGNOSIS — Z170 Estrogen receptor positive status [ER+]: Secondary | ICD-10-CM | POA: Diagnosis not present

## 2021-08-09 DIAGNOSIS — C50411 Malignant neoplasm of upper-outer quadrant of right female breast: Secondary | ICD-10-CM | POA: Insufficient documentation

## 2021-08-09 MED ORDER — FUROSEMIDE 20 MG PO TABS: 20.0000 mg | ORAL_TABLET | ORAL | 0 refills | Status: AC | PRN

## 2021-08-09 MED ORDER — POTASSIUM CHLORIDE CRYS ER 20 MEQ PO TBCR: 20.0000 meq | EXTENDED_RELEASE_TABLET | Freq: Every day | ORAL | 0 refills | Status: DC

## 2021-08-09 NOTE — Assessment & Plan Note (Addendum)
Right lumpectomy: 10/28/2019: Grade 2 IDC 1.4 cm, intermediate grade DCIS, margins negative, suspicious for LVI, ER 95%, PR 0%, HER-2 negative, Ki-67 2% T1c Nxstage Ia  Recommendation: Adjuvant antiestrogen therapy with anastrozole 1 mg daily x5 years Did not want to do radiation.  Anastrozole Toxicities: Denies any adverse effects to anastrozole therapy. Chronic arthritis and rotator cuff tendinitis in the right shoulder  Breast cancer Surveillance: 1. Breast Exam:08/09/21: Benign 2. Mammograms: 07/02/2020: Benign breast density category B  Lower extremity edema: This is after she worked painting her house and stood on her feet all day.  She had an echocardiogram which did not show any clear-cut LV or RV dysfunction.  She did have a dilated left atrium.  I gave her a prescription for Lasix to be used on an as-needed basis along with potassium.  She will call Dr. Lovena Le her cardiologist to discuss this further.  Return to clinic in 1 year for follow-up

## 2021-08-11 ENCOUNTER — Other Ambulatory Visit: Payer: Self-pay | Admitting: Physician Assistant

## 2021-08-18 NOTE — Progress Notes (Signed)
Remote pacemaker transmission.   

## 2021-08-26 ENCOUNTER — Other Ambulatory Visit: Payer: Self-pay | Admitting: Internal Medicine

## 2021-08-26 DIAGNOSIS — I480 Paroxysmal atrial fibrillation: Secondary | ICD-10-CM

## 2021-08-26 NOTE — Telephone Encounter (Signed)
Prescription refill request for Eliquis received. Indication: Afib  Last office visit:09/21/20 [name]) Scr: 1.20 (12/28/20) Age: 81 Weight: 78.3kg  Appropriate dose and refill sent to requested pharmacy.

## 2021-08-31 DIAGNOSIS — G4733 Obstructive sleep apnea (adult) (pediatric): Secondary | ICD-10-CM | POA: Diagnosis not present

## 2021-09-06 ENCOUNTER — Encounter: Payer: Self-pay | Admitting: Neurology

## 2021-09-06 ENCOUNTER — Ambulatory Visit: Payer: Medicare Other | Admitting: Neurology

## 2021-09-06 VITALS — BP 145/80 | HR 67 | Ht 64.0 in | Wt 172.2 lb

## 2021-09-06 DIAGNOSIS — Z789 Other specified health status: Secondary | ICD-10-CM | POA: Diagnosis not present

## 2021-09-06 DIAGNOSIS — G4733 Obstructive sleep apnea (adult) (pediatric): Secondary | ICD-10-CM

## 2021-09-06 DIAGNOSIS — Z9989 Dependence on other enabling machines and devices: Secondary | ICD-10-CM

## 2021-09-06 NOTE — Progress Notes (Signed)
Subjective:    Patient ID: [name] is a 81 y.o. female.  HPI    Interim history:   [name] is an 81 year old right-handed woman with an underlying complex medical history of breast cancer (status post partial mastectomy and currently on Arimidex), anemia, atrial fibrillation, bradycardia (status post pacemaker placement in 2019), depression, anxiety, diverticulosis, reflux disease, hypothyroidism, migraine headaches, irritable bowel syndrome, ocular myasthenia gravis (for which she had seen Dr. Jannifer Franklin in this office, now followed by Dr. Krista Blue), and overweight state, who presents for follow-up consultation of her obstructive sleep apnea after interim testing and starting PAP therapy.  The patient is unaccompanied today.  I first met her at the request of her primary care nurse practitioner on 03/31/2021, at which time she reported a prior diagnosis of mild sleep apnea.  She had snoring and daytime tiredness.  She was advised to proceed with sleep testing.  She had a baseline sleep study, followed by a titration study.  Her baseline sleep study from 05/15/2021 showed moderate to severe obstructive sleep apnea with an AHI of 28.5/h, REM AHI 40/h, O2 nadir 74%.  She was asked to return for a second study for proper titration, which she had on 06/02/2021.  Unfortunately, she did not sleep very well that night, she had difficulty with sleep consolidation, she was tried on different masks.  She had a titration from 5 cm to 11 cm.  She had difficulty maintaining sleep and at a pressure of 11 cm her AHI was 46.8/h, O2 nadir 83%.  She was asked to start AutoPap therapy at home however set up date was 07/01/2021.  She has a ResMed air sense 11 AutoSet machine.  Today, 09/06/2021: I reviewed her AutoPap compliance data from 08/06/2021 through 09/04/2021, which is a total of 30 days, during which time she used her machine every night with percent use days greater than 4 hours at 100%, indicating superb compliance with  an average usage of 7 hours and 40 minutes on days on treatment.  Residual AHI slightly elevated at 6.9/h, leak on the high side fairly consistently with the 95th percentile at 67.6 L/min, average pressure for the 95th percentile at 12.1 cm with a range of 7 to 14 cm with EPR.  She uses a fullface mask.  She does notice the leak.  She does not have a significant improvement in her daytime tiredness.  She suffers from severe arthritis and pain.  She has noticed improvement in her nocturia.  She is motivated to continue with treatment.  She would be willing to get a mask refit done.  She lives alone, she has had interim stress secondary to her daughter's strokes.  Her daughter has recently been advised no longer to drive and this will make it difficult for patient to come to appointments.  Patient is distressed over this and does become tearful.  The patient's allergies, current medications, family history, past medical history, past social history, past surgical history and problem list were reviewed and updated as appropriate.   Previously:   03/31/21: (She) reports snoring and daytime tiredness and a strong family history of sleep apnea.  She also has a prior diagnosis of mild obstructive sleep.  I reviewed your office note from 10/12/2020.  I was able to review a prior sleep study.  She had a sleep study through the [facility] health system in [place], [place] on 07/12/2014, the study was interpreted by Dr. Phillips Odor.  Her total AHI was 5.4/h.  Oxygen desaturation nadir was 79% during REM sleep.  She had very little REM sleep.  I reviewed her test results with her and her daughter.   She is on multiple medications and also takes medications to help her sleep.  She has been on trazodone, and takes Xanax.  Her daughter is concerned about her snoring and apneic pauses in her breathing.   She is retired, she was a [employer]ar[employer] manager.  She lives alone, she had 4 children, unfortunately 2 of her kids passed away.   Altogether, 3 children have or had sleep apnea.  She is a non-smoker and does not drink alcohol and drinks very little caffeine in the form of tea 1 cup/day and half a soda bottle.   She goes to bed late typically.  She will be in bed between 11 and midnight and may repeat till 2 AM.  Rise time may be around 10 AM or later depending on when she actually fell asleep.  She does have nocturia about 2-3 times per average night.  She used to wake up with headaches and had migraines in the past but currently does not suffer from recurrent headaches.   Her Epworth sleepiness score is 4 out of 24, fatigue severity score is 36 out of 63.     Her Past Medical History Is Significant For: Past Medical History:  Diagnosis Date   Anemia    Atrial fibrillation ([place])    Breast cancer ([place]) 09/2019   right breast    Chronic back pain    Chronic nausea    Complication of anesthesia    hypotension, elevated heart rate and oxygenation desaturation   Depression with anxiety    Diverticulosis    Dyspnea    due to myasthenia gravis   Essential hypertension    GERD (gastroesophageal reflux disease)    History of pneumonia    Hypothyroidism    IBS (irritable colon syndrome)    LVH (left ventricular hypertrophy)    Migraines    Mixed hyperlipidemia    Obesity    Ocular myasthenia gravis (HCC)    Osteoarthritis of knee    Pacemaker    PAF (paroxysmal atrial fibrillation) (HCC)    chads2vasc score is at least 4   Paraganglioma ([place])    Resection 02/2012 (retroperitoneal)   Paraganglioma, malignant ([place]) 11/06/2015   Presence of permanent cardiac pacemaker 07/18/2017   Renal insufficiency    Skin cancer     Her Past Surgical History Is Significant For: Past Surgical History:  Procedure Laterality Date   ABDOMINAL HYSTERECTOMY     BREAST BIOPSY Right 09/2019   BREAST LUMPECTOMY Right 10/28/2019   CARDIAC CATHETERIZATION     CHOLECYSTECTOMY     COLONOSCOPY  08/24/2011   Procedure: COLONOSCOPY;   Surgeon: Rogene Houston, MD;  [facility];  Service: Endoscopy;  Laterality: N/A;  1200   CT guided biopsy  01/16/12   GIVENS CAPSULE STUDY  01/29/2012   Procedure: GIVENS CAPSULE STUDY;  Surgeon: Rogene Houston, MD;  Location: [facility];  Service: Endoscopy;  Laterality: N/A;  [place]     LAPAROTOMY  03/01/2012   Procedure: EXPLORATORY LAPAROTOMY;  Surgeon: Earnstine Regal, MD;  Location: [facility];  Service: General;  Laterality: N/A;  Exploratory Laparotomy ,Resection Retroperitoneal Mass   MASTECTOMY, PARTIAL Right 10/28/2019   Procedure: RIGHT BREAST PARTIAL MASTECTOMY;  Surgeon: Armandina Gemma, MD;  Location: [facility] [place];  Service: General;  Laterality:  Right;   NASAL SINUS SURGERY     30 yrs ago   PACEMAKER IMPLANT N/A 07/18/2017   Procedure: PACEMAKER IMPLANT;  Surgeon: Evans Lance, MD;  Location: [place] [facility];  Service: Cardiovascular;  Laterality: N/A;   RIGHT/LEFT HEART CATH AND CORONARY ANGIOGRAPHY N/A 11/18/2018   Procedure: RIGHT/LEFT HEART CATH AND CORONARY ANGIOGRAPHY;  Surgeon: Belva Crome, MD;  Location: [place] [facility];  Service: Cardiovascular;  Laterality: N/A;   TOTAL KNEE ARTHROPLASTY Bilateral    2005 and 2011    Her Family History Is Significant For: Family History  Problem Relation Age of Onset   Inflammatory bowel disease Maternal Grandmother    Sleep apnea Daughter    Sleep apnea Daughter    Sleep apnea Son     Her Social History Is Significant For: Social History   Socioeconomic History   Marital status: Widowed    Spouse name: Not on file   Number of children: Not on file   Years of education: Not on file   Highest education level: Not on file  Occupational History   Occupation: Retired CCU nurse  Tobacco Use   Smoking status: Never    Passive exposure: Never   Smokeless tobacco: Never  Vaping Use   Vaping Use: Never used  Substance and Sexual Activity   Alcohol use: No    Alcohol/week: 0.0  standard drinks of alcohol   Drug use: No   Sexual activity: Not on file  Other Topics Concern   Not on file  Social History Narrative   Lives in [place] alone.  Retired Quarry manager.   Caffeine use: few ounces of coke about a every day, hot tea    Right handed   Social Determinants of Health   Financial Resource Strain: Not on file  Food Insecurity: Not on file  Transportation Needs: Not on file  Physical Activity: Not on file  Stress: Not on file  Social Connections: Not on file    Her Allergies Are:  Allergies  Allergen Reactions   Nitrofurantoin Macrocrystal Nausea And Vomiting   Ampicillin Rash    Did it involve swelling of the face/tongue/throat, SOB, or low BP? No Did it involve sudden or severe rash/hives, skin peeling, or any reaction on the inside of your mouth or nose? No Did you need to seek medical attention at a hospital or doctor's office? No When did it last happen? 20 years ago     If all above answers are "NO", may proceed with cephalosporin use.    Avocado Diarrhea and Nausea And Vomiting   Biaxin [Clarithromycin] Rash   Doxycycline Monohydrate Nausea And Vomiting   Moxifloxacin     Contraindication myasthenia gravis   Sensi-Care Protective Barrier [Petrolatum-Zinc Oxide] Rash   Tetracyclines & Related Nausea And Vomiting  :   Her Current Medications Are:  Outpatient Encounter Medications as of 09/06/2021  Medication Sig   ALPRAZolam (XANAX) 0.5 MG tablet Take 1 mg by mouth 2 (two) times daily. Takes as needed.   anastrozole (ARIMIDEX) 1 MG tablet TAKE (1) TABLET BY MOUTH ONCE DAILY.   apixaban (ELIQUIS) 5 MG TABS tablet TAKE (1) TABLET BY MOUTH TWICE DAILY.   Artificial Tear Solution (SOOTHE XP OP) Place 1 drop into both eyes 4 (four) times daily.   atenolol (TENORMIN) 25 MG tablet TAKE (1) TABLET BY MOUTH ONCE DAILY. MAY TAKE AN ADDITIONAL TABLET IF SYSTOLIC OVER 132 OR SEVERE PALPITATIONS.   atorvastatin (LIPITOR) 80 MG tablet  Take 80 mg by  mouth daily.   B Complex-C (B-COMPLEX WITH VITAMIN C) tablet Take 1 tablet by mouth daily with supper.    baclofen (LIORESAL) 10 MG tablet Take 0.5 tablets (5 mg total) by mouth at bedtime as needed for muscle spasms.   Coenzyme Q10 (COQ10) 100 MG CAPS Take 100 mg by mouth daily.    diclofenac sodium (VOLTAREN) 1 % GEL Apply 4 g topically 4 (four) times daily as needed (for pain).    furosemide (LASIX) 20 MG tablet Take 1 tablet (20 mg total) by mouth as needed.   gabapentin (NEURONTIN) 300 MG capsule Take 1 capsule (300 mg total) by mouth 3 (three) times daily. (Patient taking differently: Take 400 mg by mouth 3 (three) times daily.)   lansoprazole (PREVACID) 30 MG capsule Take 1 capsule (30 mg total) by mouth daily before breakfast. TAKE (1) CAPSULE BY MOUTH TWICE DAILY. (Patient taking differently: Take 30 mg by mouth every evening.)   levothyroxine (SYNTHROID) 100 MCG tablet Take 100 mcg by mouth daily.   lidocaine (LIDODERM) 5 % Place 1-3 patches onto the skin daily as needed (for pain). Remove & Discard patch within 12 hours or as directed by MD for pain   mycophenolate (CELLCEPT) 500 MG tablet Take 1 tablet (500 mg total) by mouth 2 (two) times daily.   OVER THE COUNTER MEDICATION Calcium and vit D one bid   oxyCODONE-acetaminophen (PERCOCET) 10-325 MG tablet Take 1 tablet by mouth every 4 (four) hours as needed (pain).   penicillin v potassium (VEETID) 500 MG tablet Take 1 tablet (500 mg total) by mouth 4 (four) times daily.   polyethylene glycol powder (GLYCOLAX/MIRALAX) 17 GM/SCOOP powder Take 8.5 g by mouth daily. (Patient taking differently: Take 8.5 g by mouth 2 (two) times daily.)   potassium chloride SA (KLOR-CON M) 20 MEQ tablet Take 1 tablet (20 mEq total) by mouth daily.   predniSONE (DELTASONE) 1 MG tablet Take 5 tablets (5 mg total) by mouth daily with breakfast.   predniSONE (DELTASONE) 5 MG tablet Take 1 tablet (5 mg total) by mouth daily with breakfast. 2 tablets (Patient  taking differently: Take 5 mg by mouth daily with breakfast.)   pyridostigmine (MESTINON) 60 MG tablet Take 0.5 tablets (30 mg total) by mouth 4 (four) times daily.   SUMAtriptan (IMITREX) 50 MG tablet Take 50 mg by mouth every 2 (two) hours as needed. For migraines   Terbinafine 1 % GEL Apply 1 application topically 2 (two) times daily as needed (toe nail fungus).    traZODone (DESYREL) 50 MG tablet TAKE (1) TABLET BY MOUTH AT BEDTIME.   No facility-administered encounter medications on file as of 09/06/2021.  :  Review of Systems:  Out of a complete 14 point review of systems, all are reviewed and negative with the exception of these symptoms as listed below:  Review of Systems  Neurological:        Initial cpap,  DME [employer].   Set up 07-01-2021. Tolerating well.  Tapering down on prednisone for MG (FYI).    Objective:  Neurological Exam  Physical Exam Physical Examination:   Vitals:   09/06/21 1350  BP: (!) 145/80  Pulse: 67    General Examination: The patient is a very pleasant 81 y.o. female in no acute distress. She appears well-developed and well-nourished and well groomed.  She does become tearful during the appointment as she talked about her daughter's medical issues.  HEENT: Normocephalic, atraumatic, pupils are equal,  round and reactive to light, slightly disconjugate gaze today, very mild ptosis, status post cataract repair on the left.  Speech is clear with no dysarthria noted. There is no hypophonia. There is no lip, neck/head, jaw or voice tremor. Neck is supple with full range of passive and active motion. There are no carotid bruits on auscultation.  Somewhat prominent thyroid especially on the right. Oropharynx exam reveals: mild mouth dryness, adequate dental hygiene and mild airway crowding. Tongue protrudes centrally and palate elevates symmetrically.    Chest: Clear to auscultation without wheezing, rhonchi or crackles noted.   Heart: S1+S2+0,  regular and normal without murmurs, rubs or gallops noted.    Abdomen: Soft, non-tender and non-distended.   Extremities: There is mild edema in the distal lower extremities bilaterally.    Skin: Warm and dry without trophic changes noted.    Musculoskeletal: exam reveals excess curvature in the upper back, arthritic changes in both hands. Very limited mobility in both shoulders.   Neurologically:  Mental status: The patient is awake, alert and oriented in all 4 spheres. Her immediate and remote memory, attention, language skills and fund of knowledge are appropriate. There is no evidence of aphasia, agnosia, apraxia or anomia. Speech is clear with normal prosody and enunciation. Thought process is linear. Mood is normal and affect is normal.  Cranial nerves II - XII are as described above under HEENT exam.  Motor exam: Normal bulk, moves all 4 extremities but has limited range of motion in both shoulders and right hip, no obvious tremor.   Cerebellar testing: No dysmetria or intention tremor. There is no truncal or gait ataxia.  Sensory exam: intact to light touch in the upper and lower extremities.  Gait, station and balance: She stands with mild difficulty and walks slowly and cautiously.   Assessment and Plan:  In summary, [name] is a very pleasant 81 year old female with an underlying complex medical history of breast cancer (status post partial mastectomy and currently on Arimidex), anemia, atrial fibrillation on Eliquis, bradycardia (status post pacemaker placement in 2019), depression, anxiety, diverticulosis, reflux disease, hypothyroidism, migraine headaches, irritable bowel syndrome, ocular myasthenia gravis (followed previously by Dr. Jannifer Franklin, and now by Dr. Krista Blue), and overweight state, who presents for follow-up consultation of her obstructive sleep apnea which was deemed in the moderate to severe range by baseline sleep testing in February 2023.  She had a titration study in  March 2023 which was not fully successful.  She has been on AutoPap therapy since April 2023 and is compliant with treatment.  She has benefited to some degree from treatment, particularly with improvement in her nocturia.  She is motivated to continue with treatment.  She is commended for her treatment adherence.  Apnea scores are much better but slightly suboptimal at this time.  She has a high leak from the mask and is advised to proceed with a mask refit appointment through her DME provider.  We will plan a follow-up in 6 months for her to see one of our nurse practitioners in a virtual visit due to difficulty with transportation.  She is advised to let us know if she has any interim questions or concerns.  We talked about her sleep study results in detail today as well as reviewed her compliance data.  I answered all her questions today and she was in agreement with our plan.   I spent 40 minutes in total face-to-face time and in reviewing records during pre-charting, more than 50%  of which was spent in counseling and coordination of care, reviewing test results, reviewing medications and treatment regimen and/or in discussing or reviewing the diagnosis of OSA, the prognosis and treatment options. Pertinent laboratory and imaging test results that were available during this visit with the patient were reviewed by me and considered in my medical decision making (see chart for details).

## 2021-09-06 NOTE — Patient Instructions (Signed)
It was nice to see you again today.  You are compliant with your AutoPap therapy, keep up the good work!.  As discussed, you may benefit from an actual appointment for a mask refit with [facility] as your mask is leaking.  Please follow-up to see one of our nurse practitioners in 6 months in a virtual/video visit.   Please continue using your autoPAP regularly. While your insurance requires that you use PAP at least 4 hours each night on 70% of the nights, I recommend, that you not skip any nights and use it throughout the night if you can. Getting used to PAP and staying with the treatment long term does take time and patience and discipline. Untreated obstructive sleep apnea when it is moderate to severe can have an adverse impact on cardiovascular health and raise her risk for heart disease, arrhythmias, hypertension, congestive heart failure, stroke and diabetes. Untreated obstructive sleep apnea causes sleep disruption, nonrestorative sleep, and sleep deprivation. This can have an impact on your day to day functioning and cause daytime sleepiness and impairment of cognitive function, memory loss, mood disturbance, and problems focussing. Using PAP regularly can improve these symptoms.

## 2021-09-07 NOTE — Progress Notes (Signed)
Fax new orders to [facility] [phone], fax confirmation received.

## 2021-09-19 ENCOUNTER — Ambulatory Visit: Payer: Medicare Other | Admitting: Cardiovascular Disease

## 2021-10-03 DIAGNOSIS — E039 Hypothyroidism, unspecified: Secondary | ICD-10-CM | POA: Diagnosis not present

## 2021-10-03 DIAGNOSIS — N1831 Chronic kidney disease, stage 3a: Secondary | ICD-10-CM | POA: Diagnosis not present

## 2021-10-03 DIAGNOSIS — E559 Vitamin D deficiency, unspecified: Secondary | ICD-10-CM | POA: Diagnosis not present

## 2021-10-03 DIAGNOSIS — Z0000 Encounter for general adult medical examination without abnormal findings: Secondary | ICD-10-CM | POA: Diagnosis not present

## 2021-10-03 DIAGNOSIS — E782 Mixed hyperlipidemia: Secondary | ICD-10-CM | POA: Diagnosis not present

## 2021-10-03 DIAGNOSIS — M791 Myalgia, unspecified site: Secondary | ICD-10-CM | POA: Diagnosis not present

## 2021-10-06 ENCOUNTER — Ambulatory Visit: Payer: Medicare Other | Admitting: Cardiovascular Disease

## 2021-10-18 ENCOUNTER — Encounter: Payer: Self-pay | Admitting: Cardiology

## 2021-10-18 ENCOUNTER — Ambulatory Visit: Payer: Medicare Other | Admitting: Cardiology

## 2021-10-18 VITALS — BP 130/80 | HR 63 | Ht 64.0 in | Wt 171.0 lb

## 2021-10-18 DIAGNOSIS — I495 Sick sinus syndrome: Secondary | ICD-10-CM | POA: Diagnosis not present

## 2021-10-18 DIAGNOSIS — Z950 Presence of cardiac pacemaker: Secondary | ICD-10-CM

## 2021-10-18 DIAGNOSIS — I480 Paroxysmal atrial fibrillation: Secondary | ICD-10-CM | POA: Diagnosis not present

## 2021-10-18 NOTE — Patient Instructions (Signed)
Medication Instructions:  The current medical regimen is effective;  continue present plan and medications.  *If you need a refill on your cardiac medications before your next appointment, please call your pharmacy*  Follow-Up: At [facility], you and your health needs are our priority.  As part of our continuing mission to provide you with exceptional heart care, we have created designated Provider Care Teams.  These Care Teams include your primary Cardiologist (physician) and Advanced Practice Providers (APPs -  Physician Assistants and Nurse Practitioners) who all work together to provide you with the care you need, when you need it.  We recommend signing up for the patient portal called "MyChart".  Sign up information is provided on this After Visit Summary.  MyChart is used to connect with patients for Virtual Visits (Telemedicine).  Patients are able to view lab/test results, encounter notes, upcoming appointments, etc.  Non-urgent messages can be sent to your provider as well.   To learn more about what you can do with MyChart, go to [email or web address].    Your next appointment:   1 year(s)  The format for your next appointment:   In Person  Provider:   Dr Candee Furbish    Important Information About Sugar

## 2021-10-18 NOTE — Progress Notes (Signed)
Cardiology Office Note:    Date:  10/18/2021   ID:  [name], DOB July 11, 1940, MRN [id number]  PCP:  Pablo Lawrence, NP   [facility] Providers Cardiologist:  Candee Furbish, MD     Referring MD: Pablo Lawrence, NP   History of Present Illness:    [name] is a 81 y.o. female here for the follow-up of paroxysmal atrial fibrillation, pacemaker and hypertension.  She has myasthenia gravis on prednisone CellCept pyridostigmine.  She is followed by Dr. Lovena Le, last seen by him on 09/21/2020 where she was doing well.   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  Has 4 children.  Had 3 of them at age 30, 43, 75.  The fourth 1 was at age 65.  Yesterday she had pain in her left arm; however this is not typical and she notes that her daughter was in surgery for her back.  She denies any breakthrough episodes of atrial fibrillation. Has not been aware of any episodes since she received her pacemaker.  For activity she is able to walk for up to an hour. Due to the hot weather she has been exercising with a Cubii pedaling machine. She does not feel physically limited. 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 She enjoys vegetables, fried squash, cornbread, pinto beans. Typically she does not eat meat. Sometimes she will try to eat salmon but she does not enjoy it frequently.  She denies any palpitations, chest pain, shortness of breath, or peripheral edema. No lightheadedness, headaches, syncope, orthopnea, or PND.   Past Medical History:  Diagnosis Date   Anemia    Atrial fibrillation ([place])    Breast cancer ([place]) 09/2019   right breast    Chronic back pain    Chronic nausea    Complication of anesthesia    hypotension, elevated heart rate and oxygenation desaturation   Depression with anxiety     Diverticulosis    Dyspnea    due to myasthenia gravis   Essential hypertension    GERD (gastroesophageal reflux disease)    History of pneumonia    Hypothyroidism    IBS (irritable colon syndrome)    LVH (left ventricular hypertrophy)    Migraines    Mixed hyperlipidemia    Obesity    Ocular myasthenia gravis (HCC)    Osteoarthritis of knee    Pacemaker    PAF (paroxysmal atrial fibrillation) (HCC)    chads2vasc score is at least 4   Paraganglioma ([place])    Resection 02/2012 (retroperitoneal)   Paraganglioma, malignant ([place]) 11/06/2015   Presence of permanent cardiac pacemaker 07/18/2017   Renal insufficiency    Skin cancer     Past Surgical History:  Procedure Laterality Date   ABDOMINAL HYSTERECTOMY     BREAST BIOPSY Right 09/2019   BREAST LUMPECTOMY Right 10/28/2019   CARDIAC CATHETERIZATION     CHOLECYSTECTOMY     COLONOSCOPY  08/24/2011   Procedure: COLONOSCOPY;  Surgeon: Rogene Houston, MD;  [facility];  Service: Endoscopy;  Laterality: N/A;  1200   CT guided biopsy  01/16/12   GIVENS CAPSULE STUDY  01/29/2012   Procedure: GIVENS CAPSULE STUDY;  Surgeon: Rogene Houston, MD;  [facility];  Service: Endoscopy;  Laterality: N/A;  [place]     LAPAROTOMY  03/01/2012  Procedure: EXPLORATORY LAPAROTOMY;  Surgeon: Earnstine Regal, MD;  [facility]: [facility];  Service: General;  Laterality: N/A;  Exploratory Laparotomy ,Resection Retroperitoneal Mass   MASTECTOMY, PARTIAL Right 10/28/2019   Procedure: RIGHT BREAST PARTIAL MASTECTOMY;  Surgeon: Armandina Gemma, MD;  Location: [place];  Service: General;  Laterality: Right;   NASAL SINUS SURGERY     30 yrs ago   PACEMAKER IMPLANT N/A 07/18/2017   Procedure: PACEMAKER IMPLANT;  Surgeon: Evans Lance, MD;  Location: [place] [facility];  Service: Cardiovascular;  Laterality: N/A;   RIGHT/LEFT HEART CATH AND CORONARY ANGIOGRAPHY N/A 11/18/2018   Procedure: RIGHT/LEFT HEART CATH AND  CORONARY ANGIOGRAPHY;  Surgeon: Belva Crome, MD;  Location: [place] [facility];  Service: Cardiovascular;  Laterality: N/A;   TOTAL KNEE ARTHROPLASTY Bilateral    2005 and 2011    Current Medications: Current Meds  Medication Sig   ALPRAZolam (XANAX) 0.5 MG tablet Take 1 mg by mouth 2 (two) times daily. Takes as needed.   anastrozole (ARIMIDEX) 1 MG tablet TAKE (1) TABLET BY MOUTH ONCE DAILY.   apixaban (ELIQUIS) 5 MG TABS tablet TAKE (1) TABLET BY MOUTH TWICE DAILY.   Artificial Tear Solution (SOOTHE XP OP) Place 1 drop into both eyes 4 (four) times daily.   atenolol (TENORMIN) 25 MG tablet TAKE (1) TABLET BY MOUTH ONCE DAILY. MAY TAKE AN ADDITIONAL TABLET IF SYSTOLIC OVER 409 OR SEVERE PALPITATIONS.   atorvastatin (LIPITOR) 40 MG tablet Take 40 mg by mouth daily.   B Complex-C (B-COMPLEX WITH VITAMIN C) tablet Take 1 tablet by mouth daily with supper.    baclofen (LIORESAL) 10 MG tablet Take 0.5 tablets (5 mg total) by mouth at bedtime as needed for muscle spasms.   Coenzyme Q10 (COQ10) 100 MG CAPS Take 100 mg by mouth daily.    diclofenac sodium (VOLTAREN) 1 % GEL Apply 4 g topically 4 (four) times daily as needed (for pain).    furosemide (LASIX) 20 MG tablet Take 1 tablet (20 mg total) by mouth as needed.   gabapentin (NEURONTIN) 300 MG capsule Take 1 capsule (300 mg total) by mouth 3 (three) times daily. (Patient taking differently: Take 400 mg by mouth 3 (three) times daily.)   lansoprazole (PREVACID) 30 MG capsule Take 1 capsule (30 mg total) by mouth daily before breakfast. TAKE (1) CAPSULE BY MOUTH TWICE DAILY. (Patient taking differently: Take 30 mg by mouth every evening.)   levothyroxine (SYNTHROID) 100 MCG tablet Take 100 mcg by mouth daily.   lidocaine (LIDODERM) 5 % Place 1-3 patches onto the skin daily as needed (for pain). Remove & Discard patch within 12 hours or as directed by MD for pain   mycophenolate (CELLCEPT) 500 MG tablet Take 1 tablet (500 mg total) by mouth 2  (two) times daily.   OVER THE COUNTER MEDICATION Calcium and vit D one bid   oxyCODONE-acetaminophen (PERCOCET) 10-325 MG tablet Take 1 tablet by mouth every 4 (four) hours as needed (pain).   penicillin v potassium (VEETID) 500 MG tablet Take 1 tablet (500 mg total) by mouth 4 (four) times daily.   polyethylene glycol powder (GLYCOLAX/MIRALAX) 17 GM/SCOOP powder Take 8.5 g by mouth daily. (Patient taking differently: Take 8.5 g by mouth 2 (two) times daily.)   potassium chloride SA (KLOR-CON M) 20 MEQ tablet Take 1 tablet (20 mEq total) by mouth daily.   predniSONE (DELTASONE) 1 MG tablet Take 5 tablets (5 mg total) by mouth daily with breakfast. (  Patient taking differently: Take 4 mg by mouth daily with breakfast.)   pyridostigmine (MESTINON) 60 MG tablet Take 0.5 tablets (30 mg total) by mouth 4 (four) times daily.   SUMAtriptan (IMITREX) 50 MG tablet Take 50 mg by mouth every 2 (two) hours as needed. For migraines   Terbinafine 1 % GEL Apply 1 application topically 2 (two) times daily as needed (toe nail fungus).    traZODone (DESYREL) 50 MG tablet TAKE (1) TABLET BY MOUTH AT BEDTIME.     Allergies:   Nitrofurantoin macrocrystal, Ampicillin, Avocado, Biaxin [clarithromycin], Doxycycline monohydrate, Moxifloxacin, Sensi-care protective barrier [petrolatum-zinc oxide], and Tetracyclines & related   Social History   Socioeconomic History   Marital status: Widowed    Spouse name: Not on file   Number of children: Not on file   Years of education: Not on file   Highest education level: Not on file  Occupational History   Occupation: Retired CCU nurse  Tobacco Use   Smoking status: Never    Passive exposure: Never   Smokeless tobacco: Never  Vaping Use   Vaping Use: Never used  Substance and Sexual Activity   Alcohol use: No    Alcohol/week: 0.0 standard drinks of alcohol   Drug use: No   Sexual activity: Not on file  Other Topics Concern   Not on file  Social History Narrative    Lives in [place] alone.  Retired Quarry manager.   Caffeine use: few ounces of coke about a every day, hot tea    Right handed   Social Determinants of Health   Financial Resource Strain: Not on file  Food Insecurity: Not on file  Transportation Needs: Not on file  Physical Activity: Not on file  Stress: Not on file  Social Connections: Not on file     Family History: The patient's family history includes Inflammatory bowel disease in her maternal grandmother; Sleep apnea in her daughter, daughter, and son.  ROS:   Please see the history of present illness.    (+) Left arm pain All other systems reviewed and are negative.  EKGs/Labs/Other Studies Reviewed:    The following studies were reviewed today:  Echo  08/08/2021:  1. Left ventricular ejection fraction, by estimation, is 60 to 65%. The  left ventricle has normal function. The left ventricle has no regional  wall motion abnormalities. There is mild left ventricular hypertrophy.  Left ventricular diastolic parameters  were normal.   2. Right ventricular systolic function is normal. The right ventricular  size is normal. There is normal pulmonary artery systolic pressure. The  estimated right ventricular systolic pressure is 56.4 mmHg.   3. Left atrial size was severely dilated.   4. Right atrial size was mildly dilated.   5. The mitral valve is normal in structure. Mild mitral valve  regurgitation. No evidence of mitral stenosis.   6. The aortic valve is tricuspid. Aortic valve regurgitation is not  visualized. Aortic valve sclerosis is present, with no evidence of aortic  valve stenosis.   7. The inferior vena cava is normal in size with greater than 50%  respiratory variability, suggesting right atrial pressure of 3 mmHg.   Right/Left Heart Cath  11/18/2018: 1st Diag lesion is 40% stenosed. Hemodynamic findings consistent with pulmonary hypertension. LV end diastolic pressure is normal.   Normal left ventricular  function and hemodynamics. Normal right heart pressures and oximetry. Right dominant coronary anatomy Normal left main, RCA, circumflex, and LAD.  First diagonal contains proximal  40% narrowing.   RECOMMENDATIONS:   Coronary artery, LV function, and pulmonary artery pressures are all normal.   Symptoms do not appear to be of cardiac origin.  Diagnostic: Dominance: Right    PPM Implant  07/18/2017: CONCLUSIONS:   1. Successful implantation of a Medtronic dual-chamber pacemaker for symptomatic bradycardia due to sinus node dysfunction  2. No early apparent complications.   EKG:  EKG is personally reviewed and interpreted. 10/18/2021: Atrial pacing. Rate 63 bpm. LAFB.  Recent Labs: 12/28/2020: ALT 16; BUN 18; Creatinine, Ser 1.20; Hemoglobin 13.5; Platelets 164; Potassium 4.5; Sodium 142   Recent Lipid Panel No results found for: "CHOL", "TRIG", "HDL", "CHOLHDL", "VLDL", "LDLCALC", "LDLDIRECT"   Risk Assessment/Calculations:          Physical Exam:    VS:  BP 130/80 (BP Location: Left Arm, Patient Position: Sitting, Cuff Size: Normal)   Pulse 63   Ht '5\' 4"'$  (1.626 m)   Wt 171 lb (77.6 kg)   BMI 29.35 kg/m     Wt Readings from Last 3 Encounters:  10/18/21 171 lb (77.6 kg)  09/06/21 172 lb 3.2 oz (78.1 kg)  08/09/21 172 lb 9.6 oz (78.3 kg)     GEN: Well nourished, well developed in no acute distress HEENT: Normal NECK: No JVD; No carotid bruits LYMPHATICS: No lymphadenopathy CARDIAC: RRR, no murmurs, rubs, gallops RESPIRATORY:  Clear to auscultation without rales, wheezing or rhonchi  ABDOMEN: Soft, non-tender, non-distended MUSCULOSKELETAL:  No edema; No deformity  SKIN: Warm and dry NEUROLOGIC:  Alert and oriented x 3 PSYCHIATRIC:  Normal affect   ASSESSMENT:    1. Sinus node dysfunction (HCC)   2. PAF (paroxysmal atrial fibrillation) (HCC)   3. Cardiac pacemaker in situ    PLAN:    In order of problems listed above:  Paroxysmal atrial fibrillation -  Doing well.  She has not felt any breakthrough.  Atrial pacing currently.  Chronic anticoagulation - Continue with Eliquis 5 mg twice a day recent lab work reviewed.  No anemia normal renal function.  Mild thrombocytopenia - Platelet count last check 146.  No evidence of bleeding.  Occasional bruising especially while gardening, rosebushes for instance.  Tachycardia-bradycardia syndrome - Pacemaker implantation, Dr. Lovena Le has been monitoring.  Hyperlipidemia - On atorvastatin 40 mg a day.  Doing well.  Prior LDL 58 no myalgias.  Myasthenia gravis - Well-controlled on CellCept and prednisone pyridostigmine      Follow-up: 1 year.  Medication Adjustments/Labs and Tests Ordered: Current medicines are reviewed at length with the patient today.  Concerns regarding medicines are outlined above.   Orders Placed This Encounter  Procedures   EKG 12-Lead   No orders of the defined types were placed in this encounter.  Patient Instructions  Medication Instructions:  The current medical regimen is effective;  continue present plan and medications.  *If you need a refill on your cardiac medications before your next appointment, please call your pharmacy*  Follow-Up: At [facility], you and your health needs are our priority.  As part of our continuing mission to provide you with exceptional heart care, we have created designated Provider Care Teams.  These Care Teams include your primary Cardiologist (physician) and Advanced Practice Providers (APPs -  Physician Assistants and Nurse Practitioners) who all work together to provide you with the care you need, when you need it.  We recommend signing up for the patient portal called "MyChart".  Sign up information is provided on this After Visit Summary.  MyChart is used to connect with patients for Virtual Visits (Telemedicine).  Patients are able to view lab/test results, encounter notes, upcoming appointments, etc.  Non-urgent messages  can be sent to your provider as well.   To learn more about what you can do with MyChart, go to [email or web address].    Your next appointment:   1 year(s)  The format for your next appointment:   In Person  Provider:   Dr Candee Furbish    Important Information About Sugar         I,Mathew Stumpf,acting as a scribe for Candee Furbish, MD.,have documented all relevant documentation on the behalf of Candee Furbish, MD,as directed by  Candee Furbish, MD while in the presence of Candee Furbish, MD.  I, Candee Furbish, MD, have reviewed all documentation for this visit. The documentation on 10/18/21 for the exam, diagnosis, procedures, and orders are all accurate and complete.   Signed, Candee Furbish, MD  10/18/2021 3:52 PM    [facility]

## 2021-10-20 ENCOUNTER — Other Ambulatory Visit: Payer: Self-pay | Admitting: Student

## 2021-10-31 DIAGNOSIS — G4733 Obstructive sleep apnea (adult) (pediatric): Secondary | ICD-10-CM | POA: Diagnosis not present

## 2021-11-01 ENCOUNTER — Ambulatory Visit (INDEPENDENT_AMBULATORY_CARE_PROVIDER_SITE_OTHER): Payer: Medicare Other

## 2021-11-01 DIAGNOSIS — I495 Sick sinus syndrome: Secondary | ICD-10-CM | POA: Diagnosis not present

## 2021-11-02 ENCOUNTER — Telehealth: Payer: Self-pay

## 2021-11-02 ENCOUNTER — Encounter: Payer: Self-pay | Admitting: Internal Medicine

## 2021-11-02 NOTE — Telephone Encounter (Signed)
error 

## 2021-11-02 NOTE — Telephone Encounter (Signed)
--  Medtronic is sending the patient a new handheld in 7-10 business days.

## 2021-11-03 LAB — CUP PACEART REMOTE DEVICE CHECK
Battery Remaining Longevity: 102 mo
Battery Voltage: 3 V
Brady Statistic AP VP Percent: 0.04 %
Brady Statistic AP VS Percent: 99.01 %
Brady Statistic AS VP Percent: 0 %
Brady Statistic AS VS Percent: 0.95 %
Brady Statistic RA Percent Paced: 99.22 %
Brady Statistic RV Percent Paced: 0.04 %
Date Time Interrogation Session: 20230817013026
Implantable Lead Implant Date: 20190501
Implantable Lead Implant Date: 20190501
Implantable Lead Location: 753859
Implantable Lead Location: 753860
Implantable Lead Model: 3830
Implantable Lead Model: 5076
Implantable Pulse Generator Implant Date: 20190501
Lead Channel Impedance Value: 304 Ohm
Lead Channel Impedance Value: 342 Ohm
Lead Channel Impedance Value: 361 Ohm
Lead Channel Impedance Value: 456 Ohm
Lead Channel Pacing Threshold Amplitude: 0.625 V
Lead Channel Pacing Threshold Amplitude: 0.75 V
Lead Channel Pacing Threshold Pulse Width: 0.4 ms
Lead Channel Pacing Threshold Pulse Width: 0.4 ms
Lead Channel Sensing Intrinsic Amplitude: 1 mV
Lead Channel Sensing Intrinsic Amplitude: 1 mV
Lead Channel Sensing Intrinsic Amplitude: 9.125 mV
Lead Channel Sensing Intrinsic Amplitude: 9.125 mV
Lead Channel Setting Pacing Amplitude: 2 V
Lead Channel Setting Pacing Amplitude: 2.5 V
Lead Channel Setting Pacing Pulse Width: 0.4 ms
Lead Channel Setting Sensing Sensitivity: 0.9 mV

## 2021-11-09 ENCOUNTER — Other Ambulatory Visit: Payer: Self-pay | Admitting: Hematology and Oncology

## 2021-11-16 ENCOUNTER — Telehealth: Payer: Self-pay | Admitting: Internal Medicine

## 2021-11-16 NOTE — Telephone Encounter (Signed)
  1. Has your device fired? No   2. Is you device beeping? no  3. Are you experiencing draining or swelling at device site? no  4. Are you calling to see if we received your device transmission? Yes, she states the light was blinking during the night and the only way she could get it to stop was to send [place] a transmission.  5. Have you passed out? no    Please route to [place]

## 2021-11-17 NOTE — Telephone Encounter (Signed)
I have let patient know we got her transmission and her monitor is working fine.

## 2021-11-22 ENCOUNTER — Ambulatory Visit: Payer: Medicare Other | Attending: Internal Medicine | Admitting: Internal Medicine

## 2021-11-22 ENCOUNTER — Encounter: Payer: Self-pay | Admitting: Internal Medicine

## 2021-11-22 VITALS — BP 132/66 | HR 72 | Ht 63.0 in | Wt 161.8 lb

## 2021-11-22 DIAGNOSIS — I495 Sick sinus syndrome: Secondary | ICD-10-CM | POA: Diagnosis not present

## 2021-11-22 NOTE — Progress Notes (Signed)
HPI [name] returns today for followup. She is a very pleasant 35 you woman s/p PPM insertion due to sinus node dysfunction. She has HTN. She has a h/o migraine headaches. Since her PPM insertion she has done well. She has undergone breast CA surgery.  Allergies  Allergen Reactions   Nitrofurantoin Macrocrystal Nausea And Vomiting   Ampicillin Rash    Did it involve swelling of the face/tongue/throat, SOB, or low BP? No Did it involve sudden or severe rash/hives, skin peeling, or any reaction on the inside of your mouth or nose? No Did you need to seek medical attention at a hospital or doctor's office? No When did it last happen? 20 years ago     If all above answers are "NO", may proceed with cephalosporin use.    Avocado Diarrhea and Nausea And Vomiting   Biaxin [Clarithromycin] Rash   Doxycycline Monohydrate Nausea And Vomiting   Moxifloxacin     Contraindication myasthenia gravis   Sensi-Care Protective Barrier [Petrolatum-Zinc Oxide] Rash   Tetracyclines & Related Nausea And Vomiting     Current Outpatient Medications  Medication Sig Dispense Refill   ALPRAZolam (XANAX) 0.5 MG tablet Take 1 mg by mouth 2 (two) times daily. Takes as needed.     anastrozole (ARIMIDEX) 1 MG tablet TAKE (1) TABLET BY MOUTH ONCE DAILY. 90 tablet 3   apixaban (ELIQUIS) 5 MG TABS tablet TAKE (1) TABLET BY MOUTH TWICE DAILY. 60 tablet 5   Artificial Tear Solution (SOOTHE XP OP) Place 1 drop into both eyes 4 (four) times daily.     atenolol (TENORMIN) 25 MG tablet TAKE (1) TABLET BY MOUTH ONCE DAILY. MAY TAKE AN ADDITIONAL TABLET IF SYSTOLIC OVER 937 OR SEVERE PALPITATIONS. 100 tablet 3   atorvastatin (LIPITOR) 40 MG tablet Take 40 mg by mouth daily.     B Complex-C (B-COMPLEX WITH VITAMIN C) tablet Take 1 tablet by mouth daily with supper.      baclofen (LIORESAL) 10 MG tablet Take 0.5 tablets (5 mg total) by mouth at bedtime as needed for muscle spasms. 15 each 3   Coenzyme Q10 (COQ10) 100  MG CAPS Take 100 mg by mouth daily.      diclofenac sodium (VOLTAREN) 1 % GEL Apply 4 g topically 4 (four) times daily as needed (for pain).      furosemide (LASIX) 20 MG tablet Take 1 tablet (20 mg total) by mouth as needed. 30 tablet 0   gabapentin (NEURONTIN) 300 MG capsule Take 1 capsule (300 mg total) by mouth 3 (three) times daily. (Patient taking differently: Take 400 mg by mouth 3 (three) times daily.)     lansoprazole (PREVACID) 30 MG capsule Take 1 capsule (30 mg total) by mouth daily before breakfast. TAKE (1) CAPSULE BY MOUTH TWICE DAILY. (Patient taking differently: Take 30 mg by mouth every evening.) 90 capsule 3   levothyroxine (SYNTHROID) 100 MCG tablet Take 100 mcg by mouth daily.     lidocaine (LIDODERM) 5 % Place 1-3 patches onto the skin daily as needed (for pain). Remove & Discard patch within 12 hours or as directed by MD for pain     mycophenolate (CELLCEPT) 500 MG tablet Take 1 tablet (500 mg total) by mouth 2 (two) times daily. 180 tablet 3   OVER THE COUNTER MEDICATION Calcium and vit D one bid     oxyCODONE-acetaminophen (PERCOCET) 10-325 MG tablet Take 1 tablet by mouth every 4 (four) hours as needed (pain).  penicillin v potassium (VEETID) 500 MG tablet Take 1 tablet (500 mg total) by mouth 4 (four) times daily. 28 tablet 0   polyethylene glycol powder (GLYCOLAX/MIRALAX) 17 GM/SCOOP powder Take 8.5 g by mouth daily. (Patient taking differently: Take 8.5 g by mouth 2 (two) times daily.)     potassium chloride SA (KLOR-CON M) 20 MEQ tablet Take 1 tablet (20 mEq total) by mouth daily. 30 tablet 0   predniSONE (DELTASONE) 1 MG tablet Take 5 tablets (5 mg total) by mouth daily with breakfast. (Patient taking differently: Take 4 mg by mouth daily with breakfast.) 450 tablet 3   pyridostigmine (MESTINON) 60 MG tablet Take 0.5 tablets (30 mg total) by mouth 4 (four) times daily. 180 tablet 3   SUMAtriptan (IMITREX) 50 MG tablet Take 50 mg by mouth every 2 (two) hours as needed.  For migraines     Terbinafine 1 % GEL Apply 1 application topically 2 (two) times daily as needed (toe nail fungus).      traZODone (DESYREL) 50 MG tablet TAKE (1) TABLET BY MOUTH AT BEDTIME. 90 tablet 2   No current facility-administered medications for this visit.     Past Medical History:  Diagnosis Date   Anemia    Atrial fibrillation ([place])    Breast cancer ([place]) 09/2019   right breast    Chronic back pain    Chronic nausea    Complication of anesthesia    hypotension, elevated heart rate and oxygenation desaturation   Depression with anxiety    Diverticulosis    Dyspnea    due to myasthenia gravis   Essential hypertension    GERD (gastroesophageal reflux disease)    History of pneumonia    Hypothyroidism    IBS (irritable colon syndrome)    LVH (left ventricular hypertrophy)    Migraines    Mixed hyperlipidemia    Obesity    Ocular myasthenia gravis (HCC)    Osteoarthritis of knee    Pacemaker    PAF (paroxysmal atrial fibrillation) (HCC)    chads2vasc score is at least 4   Paraganglioma [facility])    Resection 02/2012 (retroperitoneal)   Paraganglioma, malignant ([place]) 11/06/2015   Presence of permanent cardiac pacemaker 07/18/2017   Renal insufficiency    Skin cancer     ROS:   All systems reviewed and negative except as noted in the HPI.   Past Surgical History:  Procedure Laterality Date   ABDOMINAL HYSTERECTOMY     BREAST BIOPSY Right 09/2019   BREAST LUMPECTOMY Right 10/28/2019   CARDIAC CATHETERIZATION     CHOLECYSTECTOMY     COLONOSCOPY  08/24/2011   Procedure: COLONOSCOPY;  Surgeon: Rogene Houston, MD;  [facility];  Service: Endoscopy;  Laterality: N/A;  1200   CT guided biopsy  01/16/12   GIVENS CAPSULE STUDY  01/29/2012   Procedure: GIVENS CAPSULE STUDY;  Surgeon: Rogene Houston, MD;  [facility];  Service: Endoscopy;  Laterality: N/A;  [place]     LAPAROTOMY  03/01/2012   Procedure: EXPLORATORY LAPAROTOMY;   Surgeon: Earnstine Regal, MD;  [facility];  Service: General;  Laterality: N/A;  Exploratory Laparotomy ,Resection Retroperitoneal Mass   MASTECTOMY, PARTIAL Right 10/28/2019   Procedure: RIGHT BREAST PARTIAL MASTECTOMY;  Surgeon: Armandina Gemma, MD;  Location: [place];  Service: General;  Laterality: Right;   NASAL SINUS SURGERY     30 yrs ago   PACEMAKER IMPLANT N/A 07/18/2017  Procedure: PACEMAKER IMPLANT;  Surgeon: Evans Lance, MD;  Location: [place] CV LAB;  Service: Cardiovascular;  Laterality: N/A;   RIGHT/LEFT HEART CATH AND CORONARY ANGIOGRAPHY N/A 11/18/2018   Procedure: RIGHT/LEFT HEART CATH AND CORONARY ANGIOGRAPHY;  Surgeon: Belva Crome, MD;  Location: [place] [facility];  Service: Cardiovascular;  Laterality: N/A;   TOTAL KNEE ARTHROPLASTY Bilateral    2005 and 2011     Family History  Problem Relation Age of Onset   Inflammatory bowel disease Maternal Grandmother    Sleep apnea Daughter    Sleep apnea Daughter    Sleep apnea Son      Social History   Socioeconomic History   Marital status: Widowed    Spouse name: Not on file   Number of children: Not on file   Years of education: Not on file   Highest education level: Not on file  Occupational History   Occupation: Retired CCU nurse  Tobacco Use   Smoking status: Never    Passive exposure: Never   Smokeless tobacco: Never  Vaping Use   Vaping Use: Never used  Substance and Sexual Activity   Alcohol use: No    Alcohol/week: 0.0 standard drinks of alcohol   Drug use: No   Sexual activity: Not on file  Other Topics Concern   Not on file  Social History Narrative   Lives in [place] alone.  Retired Quarry manager.   Caffeine use: few ounces of coke about a every day, hot tea    Right handed   Social Determinants of Health   Financial Resource Strain: Not on file  Food Insecurity: Not on file  Transportation Needs: Not on file  Physical Activity: Not on file  Stress: Not  on file  Social Connections: Not on file  Intimate Partner Violence: Not on file     BP 132/66   Pulse 72   Ht '5\' 3"'$  (1.6 m)   Wt 161 lb 12.8 oz (73.4 kg)   SpO2 95%   BMI 28.66 kg/m   Physical Exam:  Well appearing NAD HEENT: Unremarkable Neck:  No JVD, no thyromegally Lymphatics:  No adenopathy Back:  No CVA tenderness Lungs:  Clear HEART:  Regular rate rhythm, no murmurs, no rubs, no clicks Abd:  soft, positive bowel sounds, no organomegally, no rebound, no guarding Ext:  2 plus pulses, no edema, no cyanosis, no clubbing Skin:  No rashes no nodules Neuro:  CN II through XII intact, motor grossly intact  DEVICE  Normal device function.  See PaceArt for details.   Assess/Plan:  1. Sinus node dysfunction - she is asymptomatic, s/p PPM insertion. 2. PPM -her [employer] DDD PM is working normally. 3. HTN - her bp is fairly well controlled at home though tends to run a little high in doctors offices. 4. Coags - she has had no bleeding on eliquis.   [email or web address].

## 2021-11-22 NOTE — Patient Instructions (Signed)
Medication Instructions:  Your physician recommends that you continue on your current medications as directed. Please refer to the Current Medication list given to you today.  *If you need a refill on your cardiac medications before your next appointment, please call your pharmacy*   Lab Work: NONE   If you have labs (blood work) drawn today and your tests are completely normal, you will receive your results only by: MyChart Message (if you have MyChart) OR A paper copy in the mail If you have any lab test that is abnormal or we need to change your treatment, we will call you to review the results.   Testing/Procedures: NONE    Follow-Up: At [facility], you and your health needs are our priority.  As part of our continuing mission to provide you with exceptional heart care, we have created designated Provider Care Teams.  These Care Teams include your primary Cardiologist (physician) and Advanced Practice Providers (APPs -  Physician Assistants and Nurse Practitioners) who all work together to provide you with the care you need, when you need it.  We recommend signing up for the patient portal called "MyChart".  Sign up information is provided on this After Visit Summary.  MyChart is used to connect with patients for Virtual Visits (Telemedicine).  Patients are able to view lab/test results, encounter notes, upcoming appointments, etc.  Non-urgent messages can be sent to your provider as well.   To learn more about what you can do with MyChart, go to [email or web address].    Your next appointment:   1 year(s)  The format for your next appointment:   In Person  Provider:   Gregg Taylor, MD    Other Instructions Thank you for choosing [facility]!    Important Information About Sugar       

## 2021-11-23 LAB — CUP PACEART INCLINIC DEVICE CHECK
Battery Remaining Longevity: 103 mo
Battery Voltage: 3 V
Brady Statistic AP VP Percent: 0.04 %
Brady Statistic AP VS Percent: 98.68 %
Brady Statistic AS VP Percent: 0 %
Brady Statistic AS VS Percent: 1.29 %
Brady Statistic RA Percent Paced: 99.03 %
Brady Statistic RV Percent Paced: 0.04 %
Date Time Interrogation Session: 20230905104500
Implantable Lead Implant Date: 20190501
Implantable Lead Implant Date: 20190501
Implantable Lead Location: 753859
Implantable Lead Location: 753860
Implantable Lead Model: 3830
Implantable Lead Model: 5076
Implantable Pulse Generator Implant Date: 20190501
Lead Channel Impedance Value: 323 Ohm
Lead Channel Impedance Value: 361 Ohm
Lead Channel Impedance Value: 380 Ohm
Lead Channel Impedance Value: 494 Ohm
Lead Channel Pacing Threshold Amplitude: 0.5 V
Lead Channel Pacing Threshold Amplitude: 0.75 V
Lead Channel Pacing Threshold Amplitude: 0.75 V
Lead Channel Pacing Threshold Amplitude: 1 V
Lead Channel Pacing Threshold Pulse Width: 0.4 ms
Lead Channel Pacing Threshold Pulse Width: 0.4 ms
Lead Channel Pacing Threshold Pulse Width: 0.4 ms
Lead Channel Pacing Threshold Pulse Width: 0.4 ms
Lead Channel Sensing Intrinsic Amplitude: 0.625 mV
Lead Channel Sensing Intrinsic Amplitude: 0.875 mV
Lead Channel Sensing Intrinsic Amplitude: 0.9 mV
Lead Channel Sensing Intrinsic Amplitude: 11.375 mV
Lead Channel Sensing Intrinsic Amplitude: 8.875 mV
Lead Channel Setting Pacing Amplitude: 2 V
Lead Channel Setting Pacing Amplitude: 2.5 V
Lead Channel Setting Pacing Pulse Width: 0.4 ms
Lead Channel Setting Sensing Sensitivity: 0.9 mV

## 2021-12-05 NOTE — Progress Notes (Signed)
Remote pacemaker transmission.   

## 2021-12-21 ENCOUNTER — Telehealth: Payer: Self-pay | Admitting: Internal Medicine

## 2021-12-21 NOTE — Telephone Encounter (Signed)
Patient c/o Palpitations:  High priority if patient c/o lightheadedness, shortness of breath, or chest pain  How long have you had palpitations/irregular HR/ Afib? Are you having the symptoms now?   Comes and goes  Are you currently experiencing lightheadedness, SOB or CP?    Lightheadedness  Do you have a history of afib (atrial fibrillation) or irregular heart rhythm?   Yes  Have you checked your BP or HR? (document readings if available):   Not available  Are you experiencing any other symptoms?   No   Daughter stated she would like the patient to get a heart monitor.  Daughter stated she is not with the patient at this time but the patient does lose her balance sometimes.  Daughter stated the patient can be contacted directly regarding symptoms.

## 2021-12-23 ENCOUNTER — Other Ambulatory Visit: Payer: Self-pay | Admitting: Internal Medicine

## 2021-12-23 ENCOUNTER — Ambulatory Visit: Payer: Medicare Other | Attending: Internal Medicine

## 2021-12-23 DIAGNOSIS — R079 Chest pain, unspecified: Secondary | ICD-10-CM

## 2021-12-23 NOTE — Telephone Encounter (Signed)
Daughter notified that provider okay'd ordering 7 day Zio. Daughter asked that monitor be shipped to pt's home.

## 2021-12-28 DIAGNOSIS — R42 Dizziness and giddiness: Secondary | ICD-10-CM | POA: Diagnosis not present

## 2021-12-28 DIAGNOSIS — R079 Chest pain, unspecified: Secondary | ICD-10-CM | POA: Diagnosis not present

## 2022-01-11 DIAGNOSIS — R079 Chest pain, unspecified: Secondary | ICD-10-CM | POA: Diagnosis not present

## 2022-01-12 ENCOUNTER — Encounter: Payer: Self-pay | Admitting: Neurology

## 2022-01-12 ENCOUNTER — Ambulatory Visit: Payer: Medicare Other | Admitting: Neurology

## 2022-01-12 VITALS — BP 126/77 | HR 80 | Ht 63.0 in | Wt 160.0 lb

## 2022-01-12 DIAGNOSIS — G4733 Obstructive sleep apnea (adult) (pediatric): Secondary | ICD-10-CM | POA: Diagnosis not present

## 2022-01-12 DIAGNOSIS — G7000 Myasthenia gravis without (acute) exacerbation: Secondary | ICD-10-CM | POA: Diagnosis not present

## 2022-01-12 MED ORDER — PREDNISONE 1 MG PO TABS: 5.0000 mg | ORAL_TABLET | Freq: Every day | ORAL | 3 refills | Status: DC

## 2022-01-12 MED ORDER — PYRIDOSTIGMINE BROMIDE 60 MG PO TABS: 30.0000 mg | ORAL_TABLET | Freq: Four times a day (QID) | ORAL | 3 refills | Status: DC

## 2022-01-12 MED ORDER — PREDNISONE 5 MG PO TABS: ORAL_TABLET | ORAL | 1 refills | Status: DC

## 2022-01-12 MED ORDER — MYCOPHENOLATE MOFETIL 500 MG PO TABS: 500.0000 mg | ORAL_TABLET | Freq: Two times a day (BID) | ORAL | 3 refills | Status: DC

## 2022-01-12 NOTE — Patient Instructions (Signed)
Try to taper down prednisone by 1 mg daily until you get to 5 mg and stay on that dose   Please see your DME for a mask refit   Keep other medications   See you back in 6 months

## 2022-01-12 NOTE — Progress Notes (Signed)
Patient: [name] Date of Birth: Nov 20, 1940  Reason for Visit: Follow up History from: Patient, daughter Primary Neurologist: Dr. Peter Garter. [facility]A  ASSESSMENT AND PLAN 81 y.o. year old female   42.  Myasthenia gravis, ocular predominant -Stable, will again try to wean down prednisone, currently taking 9 mg daily, will reduce by 1 mg monthly, but stay at 5 mg daily once reached (I sent in 5 mg tablets along with the 1 mg) -Continue CellCept 500 mg twice a day, Mestinon 60 mg 1/2 tablet 4 times daily -Check routine labs today  2.  OSA on CPAP -Commended on her compliance  -I will order a mask refit, currently has a high leak 56.6, AHI was 23.7, but of the Apneas 14.2 were unknown potentially artifact -I will pull a download in 4 to 6 weeks to see if there is improvement after mask refit -She had titration study x 2; with the 2nd study didn't sleep well had trouble with sleep consolidation   HISTORY [name], accompanied by her daughter, follow-up for myasthenia gravis, she was a patient of Dr. Jannifer Franklin.   I reviewed and summarized the referring note. PMHX. A fib, Eliquis Pacemaker HTN HLD Hypothyroidism Chronic low back, neck pain, taking Percocet 10/325 x 6 tabs a week by PCP Paraganglioma, retroperitoneal s/p resection in 2013, Right breast cancer, partial lobectomy.   Patient had severe arthritis, complains of multiple joints pain, significant chronic neck and low back pain, is getting scheduled narcotics through her primary care physician   She was diagnosed of myasthenia gravis more than 20 years ago, in 1990s she presented with double vision, myasthenia gravis symptoms are mainly persistent binocular diplopia, she never had significant swallowing difficulties/breathing difficulty, she has arm and lower extremity pain she contributed to her severe arthritis, musculoskeletal pain  Dr. Jannifer Franklin took over her management of myasthenia gravis since February 2019 after  her previous neurology retired, over the years, she has been treated with stable dose of CellCept 500 mg twice a day, on relatively low-dose of prednisone 10 mg for a long time, last visit in October 2022, she began to tapering down prednisone dose at a very slow pace, she worried about the long-term side effect of prednisone, and was not sure that it has helped her symptoms, she is a retired Equities trader   She has severe bilateral cataract, status post surgery and lens implant of left eye, left eye vision is 20/20, she purposely delayed her right cataract surgery because the visual acuity difference make double vision less obvious, previously she has to wear eye patch to suppress 1 vision loss  Update January 12, 2022 SS: tried to taper down prednisone to 6 mg, felt trouble walking, balance, went back up to 9 mg. Still trouble with balance, using cane, still works in her flowers, lives alone. Sleeps in chair. Needs to have shoulder replacement to the right, hard to push with right. No falls in 3 years. On Cellcept 500 mg BID, takes Mestinon 60 mg 1/2 4 times daily tolerates well. Has constipation. On gabapentin 300 mg TID comes from PCP, oxycodone.   Remains on CPAP, notes leaking from mask, she is very still with sleep, uses full face. Mentions constantly active, can't tell huge difference.  Review of CPAP data 12/13/2021-01/11/2022 shows 29/30 days at 97% usage, greater than 4 hours 22 days 73%.  Average usage days used 6 hours and 15 minutes.  Minimum pressure 7 maximum pressure 14 cm water.  Pressure in  the 95th percentile 12, maximum 13.1.  Leak in the 95th percentile 56.6, AHI 23.7 (0.1 central, 8.6 obstructive, 14.2 unknown).  REVIEW OF SYSTEMS: Out of a complete 14 system review of symptoms, the patient complains only of the following symptoms, and all other reviewed systems are negative.  See HPI  ALLERGIES: Allergies  Allergen Reactions   Nitrofurantoin Macrocrystal Nausea And Vomiting    Ampicillin Rash    Did it involve swelling of the face/tongue/throat, SOB, or low BP? No Did it involve sudden or severe rash/hives, skin peeling, or any reaction on the inside of your mouth or nose? No Did you need to seek medical attention at a hospital or doctor's office? No When did it last happen? 20 years ago     If all above answers are "NO", may proceed with cephalosporin use.    Avocado Diarrhea and Nausea And Vomiting   Biaxin [Clarithromycin] Rash   Doxycycline Monohydrate Nausea And Vomiting   Moxifloxacin     Contraindication myasthenia gravis   Sensi-Care Protective Barrier [Petrolatum-Zinc Oxide] Rash   Tetracyclines & Related Nausea And Vomiting    HOME MEDICATIONS: Outpatient Medications Prior to Visit  Medication Sig Dispense Refill   ALPRAZolam (XANAX) 0.5 MG tablet Take 1 mg by mouth 2 (two) times daily. Takes as needed.     anastrozole (ARIMIDEX) 1 MG tablet TAKE (1) TABLET BY MOUTH ONCE DAILY. 90 tablet 3   apixaban (ELIQUIS) 5 MG TABS tablet TAKE (1) TABLET BY MOUTH TWICE DAILY. 60 tablet 5   Artificial Tear Solution (SOOTHE XP OP) Place 1 drop into both eyes 4 (four) times daily.     atenolol (TENORMIN) 25 MG tablet TAKE (1) TABLET BY MOUTH ONCE DAILY. MAY TAKE AN ADDITIONAL TABLET IF SYSTOLIC OVER 350 OR SEVERE PALPITATIONS. 100 tablet 3   atorvastatin (LIPITOR) 40 MG tablet Take 40 mg by mouth daily.     B Complex-C (B-COMPLEX WITH VITAMIN C) tablet Take 1 tablet by mouth daily with supper.      baclofen (LIORESAL) 10 MG tablet Take 0.5 tablets (5 mg total) by mouth at bedtime as needed for muscle spasms. 15 each 3   Coenzyme Q10 (COQ10) 100 MG CAPS Take 100 mg by mouth daily.      diclofenac sodium (VOLTAREN) 1 % GEL Apply 4 g topically 4 (four) times daily as needed (for pain).      furosemide (LASIX) 20 MG tablet Take 1 tablet (20 mg total) by mouth as needed. 30 tablet 0   gabapentin (NEURONTIN) 300 MG capsule Take 1 capsule (300 mg total) by mouth 3  (three) times daily. (Patient taking differently: Take 400 mg by mouth 3 (three) times daily.)     lansoprazole (PREVACID) 30 MG capsule Take 1 capsule (30 mg total) by mouth daily before breakfast. TAKE (1) CAPSULE BY MOUTH TWICE DAILY. (Patient taking differently: Take 30 mg by mouth every evening.) 90 capsule 3   levothyroxine (SYNTHROID) 100 MCG tablet Take 100 mcg by mouth daily.     lidocaine (LIDODERM) 5 % Place 1-3 patches onto the skin daily as needed (for pain). Remove & Discard patch within 12 hours or as directed by MD for pain     OVER THE COUNTER MEDICATION Calcium and vit D one bid     oxyCODONE-acetaminophen (PERCOCET) 10-325 MG tablet Take 1 tablet by mouth every 4 (four) hours as needed (pain).     penicillin v potassium (VEETID) 500 MG tablet Take 1 tablet (500 mg total)  by mouth 4 (four) times daily. 28 tablet 0   polyethylene glycol powder (GLYCOLAX/MIRALAX) 17 GM/SCOOP powder Take 8.5 g by mouth daily. (Patient taking differently: Take 8.5 g by mouth 2 (two) times daily.)     SUMAtriptan (IMITREX) 50 MG tablet Take 50 mg by mouth every 2 (two) hours as needed. For migraines     Terbinafine 1 % GEL Apply 1 application topically 2 (two) times daily as needed (toe nail fungus).      traZODone (DESYREL) 50 MG tablet TAKE (1) TABLET BY MOUTH AT BEDTIME. 90 tablet 2   mycophenolate (CELLCEPT) 500 MG tablet Take 1 tablet (500 mg total) by mouth 2 (two) times daily. 180 tablet 3   predniSONE (DELTASONE) 1 MG tablet Take 5 tablets (5 mg total) by mouth daily with breakfast. (Patient taking differently: Take 9 mg by mouth daily with breakfast.) 450 tablet 3   pyridostigmine (MESTINON) 60 MG tablet Take 0.5 tablets (30 mg total) by mouth 4 (four) times daily. 180 tablet 3   potassium chloride SA (KLOR-CON M) 20 MEQ tablet Take 1 tablet (20 mEq total) by mouth daily. (Patient not taking: Reported on 01/12/2022) 30 tablet 0   No facility-administered medications prior to visit.    PAST  MEDICAL HISTORY: Past Medical History:  Diagnosis Date   Anemia    Atrial fibrillation ([place])    Breast cancer ([place]) 09/2019   right breast    Chronic back pain    Chronic nausea    Complication of anesthesia    hypotension, elevated heart rate and oxygenation desaturation   Depression with anxiety    Diverticulosis    Dyspnea    due to myasthenia gravis   Essential hypertension    GERD (gastroesophageal reflux disease)    History of pneumonia    Hypothyroidism    IBS (irritable colon syndrome)    LVH (left ventricular hypertrophy)    Migraines    Mixed hyperlipidemia    Obesity    Ocular myasthenia gravis (HCC)    Osteoarthritis of knee    Pacemaker    PAF (paroxysmal atrial fibrillation) (HCC)    chads2vasc score is at least 4   Paraganglioma ([place])    Resection 02/2012 (retroperitoneal)   Paraganglioma, malignant ([place]) 11/06/2015   Presence of permanent cardiac pacemaker 07/18/2017   Renal insufficiency    Skin cancer     PAST SURGICAL HISTORY: Past Surgical History:  Procedure Laterality Date   ABDOMINAL HYSTERECTOMY     BREAST BIOPSY Right 09/2019   BREAST LUMPECTOMY Right 10/28/2019   CARDIAC CATHETERIZATION     CHOLECYSTECTOMY     COLONOSCOPY  08/24/2011   Procedure: COLONOSCOPY;  Surgeon: Rogene Houston, MD;  Location: [facility];  Service: Endoscopy;  Laterality: N/A;  1200   CT guided biopsy  01/16/12   GIVENS CAPSULE STUDY  01/29/2012   Procedure: GIVENS CAPSULE STUDY;  Surgeon: Rogene Houston, MD;  Location: [facility];  Service: Endoscopy;  Laterality: N/A;  [place]     LAPAROTOMY  03/01/2012   Procedure: EXPLORATORY LAPAROTOMY;  Surgeon: Earnstine Regal, MD;  Location: [facility];  Service: General;  Laterality: N/A;  Exploratory Laparotomy ,Resection Retroperitoneal Mass   MASTECTOMY, PARTIAL Right 10/28/2019   Procedure: RIGHT BREAST PARTIAL MASTECTOMY;  Surgeon: Armandina Gemma, MD;  Location: [place];  Service: General;   Laterality: Right;   NASAL SINUS SURGERY     30 yrs ago   PACEMAKER  IMPLANT N/A 07/18/2017   Procedure: PACEMAKER IMPLANT;  Surgeon: Evans Lance, MD;  Location: [facility];  Service: Cardiovascular;  Laterality: N/A;   RIGHT/LEFT HEART CATH AND CORONARY ANGIOGRAPHY N/A 11/18/2018   Procedure: RIGHT/LEFT HEART CATH AND CORONARY ANGIOGRAPHY;  Surgeon: Belva Crome, MD;  Location: [facility];  Service: Cardiovascular;  Laterality: N/A;   TOTAL KNEE ARTHROPLASTY Bilateral    2005 and 2011    FAMILY HISTORY: Family History  Problem Relation Age of Onset   Inflammatory bowel disease Maternal Grandmother    Sleep apnea Daughter    Sleep apnea Daughter    Sleep apnea Son     SOCIAL HISTORY: Social History   Socioeconomic History   Marital status: Widowed    Spouse name: Not on file   Number of children: Not on file   Years of education: Not on file   Highest education level: Not on file  Occupational History   Occupation: Retired CCU nurse  Tobacco Use   Smoking status: Never    Passive exposure: Never   Smokeless tobacco: Never  Vaping Use   Vaping Use: Never used  Substance and Sexual Activity   Alcohol use: No    Alcohol/week: 0.0 standard drinks of alcohol   Drug use: No   Sexual activity: Not on file  Other Topics Concern   Not on file  Social History Narrative   Lives in [place] alone.  Retired Quarry manager.   Caffeine use: few ounces of coke about a every day, hot tea    Right handed   Social Determinants of Health   Financial Resource Strain: Not on file  Food Insecurity: Not on file  Transportation Needs: Not on file  Physical Activity: Not on file  Stress: Not on file  Social Connections: Not on file  Intimate Partner Violence: Not on file    PHYSICAL EXAM  Vitals:   01/12/22 1100  BP: 126/77  Pulse: 80  Weight: 160 lb (72.6 kg)  Height: '5\' 3"'$  (1.6 m)   Body mass index is 28.34 kg/m.  Generalized: Well developed, in no  acute distress  Neurological examination  Mentation: Alert oriented to time, place, history taking. Follows all commands speech and language fluent Cranial nerve II-XII: Pupils were equal round reactive to light. Extraocular movements were full, visual field were full on confrontational test, poor vision to the right eye. Facial sensation and strength were normal. Head turning and shoulder shrug were normal and symmetric.  No eye open or cheek puff weakness was noted. Motor: Good strength overall, poor mobility of the right upper extremity due to shoulder Sensory: Sensory testing is intact to soft touch on all 4 extremities. No evidence of extinction is noted.  Coordination: Cerebellar testing reveals good finger-nose-finger and heel-to-shin bilaterally.  Gait and station: Gait is wide-based, cautious mildly antalgic Reflexes: Deep tendon reflexes are symmetric but decreased  DIAGNOSTIC DATA (LABS, IMAGING, TESTING) - I reviewed patient records, labs, notes, testing and imaging myself where available.  Lab Results  Component Value Date   WBC 8.4 12/28/2020   HGB 13.5 12/28/2020   HCT 40.9 12/28/2020   MCV 99 (H) 12/28/2020   PLT 164 12/28/2020      Component Value Date/Time   NA 142 12/28/2020 1203   K 4.5 12/28/2020 1203   CL 103 12/28/2020 1203   CO2 26 12/28/2020 1203   GLUCOSE 90 12/28/2020 1203   GLUCOSE 97 11/12/2018 1505   BUN 18  12/28/2020 1203   CREATININE 1.20 (H) 12/28/2020 1203   CREATININE 1.05 (H) 05/04/2015 1559   CALCIUM 10.3 12/28/2020 1203   PROT 6.3 12/28/2020 1203   ALBUMIN 4.2 12/28/2020 1203   AST 22 12/28/2020 1203   ALT 16 12/28/2020 1203   ALKPHOS 50 12/28/2020 1203   BILITOT 0.4 12/28/2020 1203   GFRNONAA 48 (L) 03/16/2020 1550   GFRAA 56 (L) 03/16/2020 1550   No results found for: "CHOL", "HDL", "LDLCALC", "LDLDIRECT", "TRIG", "CHOLHDL" Lab Results  Component Value Date   HGBA1C 5.3 01/27/2017   Lab Results  Component Value Date   VITAMINB12  960 03/10/2019   Lab Results  Component Value Date   TSH 1.760 09/19/2017    Butler Denmark, AGNP-C, DNP 01/12/2022, 2:01 PM [facility] [address] [place], [place] [address] [phone]

## 2022-01-13 ENCOUNTER — Telehealth: Payer: Self-pay

## 2022-01-13 LAB — CBC WITH DIFFERENTIAL/PLATELET
Basophils Absolute: 0 10*3/uL (ref 0.0–0.2)
Basos: 1 %
EOS (ABSOLUTE): 0.1 10*3/uL (ref 0.0–0.4)
Eos: 1 %
Hematocrit: 38.7 % (ref 34.0–46.6)
Hemoglobin: 12.5 g/dL (ref 11.1–15.9)
Immature Grans (Abs): 0 10*3/uL (ref 0.0–0.1)
Immature Granulocytes: 0 %
Lymphocytes Absolute: 2.5 10*3/uL (ref 0.7–3.1)
Lymphs: 42 %
MCH: 32.6 pg (ref 26.6–33.0)
MCHC: 32.3 g/dL (ref 31.5–35.7)
MCV: 101 fL — ABNORMAL HIGH (ref 79–97)
Monocytes Absolute: 0.6 10*3/uL (ref 0.1–0.9)
Monocytes: 9 %
Neutrophils Absolute: 2.9 10*3/uL (ref 1.4–7.0)
Neutrophils: 47 %
Platelets: 144 10*3/uL — ABNORMAL LOW (ref 150–450)
RBC: 3.83 x10E6/uL (ref 3.77–5.28)
RDW: 12.6 % (ref 11.7–15.4)
WBC: 6.1 10*3/uL (ref 3.4–10.8)

## 2022-01-13 LAB — COMPREHENSIVE METABOLIC PANEL
ALT: 13 IU/L (ref 0–32)
AST: 15 IU/L (ref 0–40)
Albumin/Globulin Ratio: 2.1 (ref 1.2–2.2)
Albumin: 4.2 g/dL (ref 3.7–4.7)
Alkaline Phosphatase: 52 IU/L (ref 44–121)
BUN/Creatinine Ratio: 15 (ref 12–28)
BUN: 13 mg/dL (ref 8–27)
Bilirubin Total: 0.5 mg/dL (ref 0.0–1.2)
CO2: 28 mmol/L (ref 20–29)
Calcium: 9.9 mg/dL (ref 8.7–10.3)
Chloride: 102 mmol/L (ref 96–106)
Creatinine, Ser: 0.85 mg/dL (ref 0.57–1.00)
Globulin, Total: 2 g/dL (ref 1.5–4.5)
Glucose: 72 mg/dL (ref 70–99)
Potassium: 3.9 mmol/L (ref 3.5–5.2)
Sodium: 142 mmol/L (ref 134–144)
Total Protein: 6.2 g/dL (ref 6.0–8.5)
eGFR: 68 mL/min/{1.73_m2} (ref >59)

## 2022-01-13 NOTE — Telephone Encounter (Signed)
Pt called and shared monitor results with her per report and what Dr. Lovena Le had to say.   Pt is former Therapist, sports, and understood the clinical results.  No follow up required, pt will continue current plan of care.

## 2022-01-13 NOTE — Telephone Encounter (Signed)
-----   Message from Evans Lance, MD sent at 01/12/2022  9:00 PM EDT ----- Essentially normal heart monitor.

## 2022-01-16 DIAGNOSIS — D2271 Melanocytic nevi of right lower limb, including hip: Secondary | ICD-10-CM | POA: Diagnosis not present

## 2022-01-16 DIAGNOSIS — D2261 Melanocytic nevi of right upper limb, including shoulder: Secondary | ICD-10-CM | POA: Diagnosis not present

## 2022-01-16 DIAGNOSIS — D225 Melanocytic nevi of trunk: Secondary | ICD-10-CM | POA: Diagnosis not present

## 2022-01-16 DIAGNOSIS — D2262 Melanocytic nevi of left upper limb, including shoulder: Secondary | ICD-10-CM | POA: Diagnosis not present

## 2022-01-17 ENCOUNTER — Ambulatory Visit: Payer: Medicare Other | Admitting: Neurology

## 2022-01-18 ENCOUNTER — Telehealth: Payer: Self-pay

## 2022-01-18 NOTE — Telephone Encounter (Signed)
CPAP order from 01/12/2022 has been sent to [facility], confirmation received.

## 2022-01-30 ENCOUNTER — Ambulatory Visit (INDEPENDENT_AMBULATORY_CARE_PROVIDER_SITE_OTHER): Payer: Medicare Other | Admitting: Gastroenterology

## 2022-01-30 ENCOUNTER — Encounter (INDEPENDENT_AMBULATORY_CARE_PROVIDER_SITE_OTHER): Payer: Self-pay | Admitting: Gastroenterology

## 2022-01-30 VITALS — BP 118/75 | HR 73 | Temp 97.5°F | Ht 63.0 in | Wt 155.0 lb

## 2022-01-30 DIAGNOSIS — K581 Irritable bowel syndrome with constipation: Secondary | ICD-10-CM | POA: Diagnosis not present

## 2022-01-30 DIAGNOSIS — K219 Gastro-esophageal reflux disease without esophagitis: Secondary | ICD-10-CM

## 2022-01-30 DIAGNOSIS — R634 Abnormal weight loss: Secondary | ICD-10-CM | POA: Diagnosis not present

## 2022-01-30 MED ORDER — LANSOPRAZOLE 30 MG PO CPDR: 30.0000 mg | DELAYED_RELEASE_CAPSULE | Freq: Every day | ORAL | 3 refills | Status: DC

## 2022-01-30 NOTE — Patient Instructions (Addendum)
Increase Prevacid to 30 mg qday Explained presumed etiology of reflux symptoms. Instruction provided in the use of antireflux medication - patient should take medication in the morning 30-45 minutes before meal. Discussed avoidance of eating within 2 hours of lying down to sleep and benefit of blocks to elevate head of bed. Also, will benefit from avoiding carbonated drinks/sodas or food that has tomatoes, spicy or greasy food. Liberalize diet, can take protein shake at night. Continue Miralax 1-2 half capfuls per day Schedule EGD and colonoscopy

## 2022-01-30 NOTE — Progress Notes (Unsigned)
[name], M.D. Gastroenterology & Hepatology [facility] [address] [place], [place] [address]  Primary Care Physician: Pablo Lawrence, NP [place] [address]  I will communicate my assessment and recommendations to the referring MD via EMR.  Problems: GERD  History of Present Illness: [name] is a 81 y.o. female with PMH GERD, atrial fibrillation, paraganglioma, GERD, IBS, hypothyroidism, breast cancer, obesity, myasthenia gravis, hyperlipidemia, IBS and GERD, who presents for follow up of GERD.  The patient was last seen on 07/26/2021. At that time, the patient was advised to decrease lansoprazole to 30 mg every other day.  She was advised to take MiraLAX daily for constipation.  States that she is taking 1/2 capful of Miralax 1-2 times per day which makes her have a bowel almost every day.  She reports that she has presented some occasional episodes of mid chest pain. She is taking lansoprazole every other day 30 mg daily but states that occasionally she takes the lansoprazole for chest pain, which alleviates her symptoms.  Patient reports that her appetite has decreased recently and has not been eating too much as food does not taste very good. She does not take any protein shakes.    The patient denies having any dysphagia, odynophagia, nausea, vomiting, fever, chills, hematochezia, melena, hematemesis, abdominal distention, abdominal pain, diarrhea, jaundice, pruritus. Has lost some weight due to decreased appetite.  Last EGD: in the past, patient states this was done by Dr. Laural Golden but no report is present.  Last Colonoscopy: 2013 Prep excellent. Moderate number of diverticula at sigmoid colon. Two small polyps ablated via cold biopsy. These are submitted together. One was located at splenic flexure and the second one at transverse colon. Normal rectal mucosa and anorectal junction  Path:  benign colonic mucosa  Recommended repeat colonoscopy in 10 years  Past Medical History: Past Medical History:  Diagnosis Date   Anemia    Atrial fibrillation ([place])    Breast cancer ([facility]) 09/2019   right breast    Chronic back pain    Chronic nausea    Complication of anesthesia    hypotension, elevated heart rate and oxygenation desaturation   Depression with anxiety    Diverticulosis    Dyspnea    due to myasthenia gravis   Essential hypertension    GERD (gastroesophageal reflux disease)    History of pneumonia    Hypothyroidism    IBS (irritable colon syndrome)    LVH (left ventricular hypertrophy)    Migraines    Mixed hyperlipidemia    Obesity    Ocular myasthenia gravis (HCC)    Osteoarthritis of knee    Pacemaker    PAF (paroxysmal atrial fibrillation) (HCC)    chads2vasc score is at least 4   Paraganglioma ([facility])    Resection 02/2012 (retroperitoneal)   Paraganglioma, malignant ([facility]) 11/06/2015   Presence of permanent cardiac pacemaker 07/18/2017   Renal insufficiency    Skin cancer     Past Surgical History: Past Surgical History:  Procedure Laterality Date   ABDOMINAL HYSTERECTOMY     BREAST BIOPSY Right 09/2019   BREAST LUMPECTOMY Right 10/28/2019   CARDIAC CATHETERIZATION     CHOLECYSTECTOMY     COLONOSCOPY  08/24/2011   Procedure: COLONOSCOPY;  Surgeon: Rogene Houston, MD;  [facility];  Service: Endoscopy;  Laterality: N/A;  1200   CT guided biopsy  01/16/12   GIVENS CAPSULE STUDY  01/29/2012   Procedure: GIVENS CAPSULE STUDY;  Surgeon: Rogene Houston, MD;  Location: [facility];  Service: Endoscopy;  Laterality: N/A;  [place]     LAPAROTOMY  03/01/2012   Procedure: EXPLORATORY LAPAROTOMY;  Surgeon: Earnstine Regal, MD;  Location: [facility];  Service: General;  Laterality: N/A;  Exploratory Laparotomy ,Resection Retroperitoneal Mass   MASTECTOMY, PARTIAL Right 10/28/2019   Procedure: RIGHT BREAST PARTIAL MASTECTOMY;  Surgeon:  Armandina Gemma, MD;  Location: [facility];  Service: General;  Laterality: Right;   NASAL SINUS SURGERY     30 yrs ago   PACEMAKER IMPLANT N/A 07/18/2017   Procedure: PACEMAKER IMPLANT;  Surgeon: Evans Lance, MD;  Location: [facility];  Service: Cardiovascular;  Laterality: N/A;   RIGHT/LEFT HEART CATH AND CORONARY ANGIOGRAPHY N/A 11/18/2018   Procedure: RIGHT/LEFT HEART CATH AND CORONARY ANGIOGRAPHY;  Surgeon: Belva Crome, MD;  Location: [facility];  Service: Cardiovascular;  Laterality: N/A;   TOTAL KNEE ARTHROPLASTY Bilateral    2005 and 2011    Family History: Family History  Problem Relation Age of Onset   Inflammatory bowel disease Maternal Grandmother    Sleep apnea Daughter    Sleep apnea Daughter    Sleep apnea Son     Social History: Social History   Tobacco Use  Smoking Status Never   Passive exposure: Never  Smokeless Tobacco Never   Social History   Substance and Sexual Activity  Alcohol Use No   Alcohol/week: 0.0 standard drinks of alcohol   Social History   Substance and Sexual Activity  Drug Use No    Allergies: Allergies  Allergen Reactions   Nitrofurantoin Macrocrystal Nausea And Vomiting   Ampicillin Rash    Did it involve swelling of the face/tongue/throat, SOB, or low BP? No Did it involve sudden or severe rash/hives, skin peeling, or any reaction on the inside of your mouth or nose? No Did you need to seek medical attention at a hospital or doctor's office? No When did it last happen? 20 years ago     If all above answers are "NO", may proceed with cephalosporin use.    Avocado Diarrhea and Nausea And Vomiting   Biaxin [Clarithromycin] Rash   Doxycycline Monohydrate Nausea And Vomiting   Moxifloxacin     Contraindication myasthenia gravis   Sensi-Care Protective Barrier [Petrolatum-Zinc Oxide] Rash   Tetracyclines & Related Nausea And Vomiting    Medications: Current Outpatient Medications  Medication  Sig Dispense Refill   ALPRAZolam (XANAX) 0.5 MG tablet Take 1 mg by mouth 2 (two) times daily. Takes as needed.     anastrozole (ARIMIDEX) 1 MG tablet TAKE (1) TABLET BY MOUTH ONCE DAILY. 90 tablet 3   apixaban (ELIQUIS) 5 MG TABS tablet TAKE (1) TABLET BY MOUTH TWICE DAILY. 60 tablet 5   Artificial Tear Solution (SOOTHE XP OP) Place 1 drop into both eyes 4 (four) times daily.     atenolol (TENORMIN) 25 MG tablet TAKE (1) TABLET BY MOUTH ONCE DAILY. MAY TAKE AN ADDITIONAL TABLET IF SYSTOLIC OVER 623 OR SEVERE PALPITATIONS. 100 tablet 3   atorvastatin (LIPITOR) 40 MG tablet Take 40 mg by mouth daily.     B Complex-C (B-COMPLEX WITH VITAMIN C) tablet Take 1 tablet by mouth daily with supper.      baclofen (LIORESAL) 10 MG tablet Take 0.5 tablets (5 mg total) by mouth at bedtime as needed for muscle spasms. 15 each  3   Coenzyme Q10 (COQ10) 100 MG CAPS Take 100 mg by mouth daily.      diclofenac sodium (VOLTAREN) 1 % GEL Apply 4 g topically 4 (four) times daily as needed (for pain).      furosemide (LASIX) 20 MG tablet Take 1 tablet (20 mg total) by mouth as needed. 30 tablet 0   gabapentin (NEURONTIN) 300 MG capsule Take 1 capsule (300 mg total) by mouth 3 (three) times daily. (Patient taking differently: Take 400 mg by mouth 3 (three) times daily.)     lansoprazole (PREVACID) 30 MG capsule Take 1 capsule (30 mg total) by mouth daily before breakfast. TAKE (1) CAPSULE BY MOUTH TWICE DAILY. (Patient taking differently: Take 30 mg by mouth every other day.) 90 capsule 3   levothyroxine (SYNTHROID) 100 MCG tablet Take 100 mcg by mouth daily.     lidocaine (LIDODERM) 5 % Place 1-3 patches onto the skin daily as needed (for pain). Remove & Discard patch within 12 hours or as directed by MD for pain     mycophenolate (CELLCEPT) 500 MG tablet Take 1 tablet (500 mg total) by mouth 2 (two) times daily. 180 tablet 3   OVER THE COUNTER MEDICATION Calcium and vit D one bid     oxyCODONE-acetaminophen (PERCOCET)  10-325 MG tablet Take 1 tablet by mouth every 4 (four) hours as needed (pain).     polyethylene glycol powder (GLYCOLAX/MIRALAX) 17 GM/SCOOP powder Take 8.5 g by mouth daily. (Patient taking differently: Take 8.5 g by mouth 2 (two) times daily. 1-2 8.5 mg prn.)     potassium chloride SA (KLOR-CON M) 20 MEQ tablet Take 1 tablet (20 mEq total) by mouth daily. 30 tablet 0   predniSONE (DELTASONE) 1 MG tablet Take 5 tablets (5 mg total) by mouth daily with breakfast. (Patient taking differently: Take 3 mg by mouth daily with breakfast. Takes with one 5 mg tablet to make a total of 8 mg daily) 450 tablet 3   predniSONE (DELTASONE) 5 MG tablet Take 1 tablet daily, will use the 1 mg tablets in addition to taper by 1 mg monthly until down to 5 mg and stay on this dose (Patient taking differently: Take 5 mg by mouth daily at 6 (six) AM. In addition to 3 mg total of 8 mg daily) 90 tablet 1   pyridostigmine (MESTINON) 60 MG tablet Take 0.5 tablets (30 mg total) by mouth 4 (four) times daily. 180 tablet 3   SUMAtriptan (IMITREX) 50 MG tablet Take 50 mg by mouth every 2 (two) hours as needed. For migraines     Terbinafine 1 % GEL Apply 1 application topically 2 (two) times daily as needed (toe nail fungus).      traZODone (DESYREL) 50 MG tablet TAKE (1) TABLET BY MOUTH AT BEDTIME. 90 tablet 2   No current facility-administered medications for this visit.    Review of Systems: GENERAL: negative for malaise, night sweats HEENT: No changes in hearing or vision, no nose bleeds or other nasal problems. NECK: Negative for lumps, goiter, pain and significant neck swelling RESPIRATORY: Negative for cough, wheezing CARDIOVASCULAR: Negative for chest pain, leg swelling, palpitations, orthopnea GI: SEE HPI MUSCULOSKELETAL: Negative for joint pain or swelling, back pain, and muscle pain. SKIN: Negative for lesions, rash PSYCH: Negative for sleep disturbance, mood disorder and recent psychosocial stressors. HEMATOLOGY  Negative for prolonged bleeding, bruising easily, and swollen nodes. ENDOCRINE: Negative for cold or heat intolerance, polyuria, polydipsia and goiter. NEURO: negative for tremor,  gait imbalance, syncope and seizures. The remainder of the review of systems is noncontributory.   Physical Exam: BP 118/75 (BP Location: Left Arm, Patient Position: Sitting, Cuff Size: Small)   Pulse 73   Temp (!) 97.5 F (36.4 C) (Temporal)   Ht '5\' 3"'$  (1.6 m)   Wt 155 lb (70.3 kg)   BMI 27.46 kg/m  GENERAL: The patient is AO x3, in no acute distress. HEENT: Head is normocephalic and atraumatic. EOMI are intact. Mouth is well hydrated and without lesions. NECK: Supple. No masses LUNGS: Clear to auscultation. No presence of rhonchi/wheezing/rales. Adequate chest expansion HEART: RRR, normal s1 and s2. ABDOMEN: Soft, nontender, no guarding, no peritoneal signs, and nondistended. BS +. No masses. EXTREMITIES: Without any cyanosis, clubbing, rash, lesions or edema. NEUROLOGIC: AOx3, no focal motor deficit. SKIN: no jaundice, no rashes  Imaging/Labs: as above  I personally reviewed and interpreted the available labs, imaging and endoscopic files.  Impression and Plan: CHARO PHILIPP is a 81 y.o. female with PMH GERD, atrial fibrillation, paraganglioma, GERD, IBS, hypothyroidism, breast cancer, obesity, myasthenia gravis, hyperlipidemia, IBS and GERD, who presents for follow up of GERD.  The patient has presented adequate control of her heartburn episodes although she is presenting some intermittent episodes of chest pain that actually improved with the use of Prevacid.  Given the improvement of her symptoms and previous cardiac work-up, I consider that her chest pain episodes are related to symptomatic GERD.  Due to this, I advised her to take the Prevacid on a daily basis 30 to 45 minutes before her meal as this may lead to burn improvement of her symptoms.  Regarding her constipation, this has improved with  the use of MiraLAX, she should keep this medication to move her bowels regularly.  Even though her weight loss could be related to decreased oral intake, we will explore her symptoms further with an EGD.  I advised her to liberalize her diet since she could gain more weight this way.  Finally, she is due for colorectal cancer screening, we will proceed with a colonoscopy.  -Increase Prevacid to 30 mg qday -Explained presumed etiology of reflux symptoms. Instruction provided in the use of antireflux medication - patient should take medication in the morning 30-45 minutes before meal. -Discussed avoidance of eating within 2 hours of lying down to sleep and benefit of blocks to elevate head of bed. Also, will benefit from avoiding carbonated drinks/sodas or food that has tomatoes, spicy or greasy food. -Liberalize diet, can take protein shake at night. -Continue Miralax 1-2 half capfuls per day -Schedule EGD and colonoscopy  All questions were answered.      Pamela Peppers, MD Gastroenterology and Hepatology [facility]

## 2022-01-31 ENCOUNTER — Telehealth: Payer: Self-pay | Admitting: *Deleted

## 2022-01-31 ENCOUNTER — Ambulatory Visit (INDEPENDENT_AMBULATORY_CARE_PROVIDER_SITE_OTHER): Payer: Medicare Other

## 2022-01-31 DIAGNOSIS — I495 Sick sinus syndrome: Secondary | ICD-10-CM | POA: Diagnosis not present

## 2022-01-31 LAB — CUP PACEART REMOTE DEVICE CHECK
Battery Remaining Longevity: 101 mo
Battery Voltage: 2.99 V
Brady Statistic AP VP Percent: 0.04 %
Brady Statistic AP VS Percent: 98.73 %
Brady Statistic AS VP Percent: 0 %
Brady Statistic AS VS Percent: 1.22 %
Brady Statistic RA Percent Paced: 98.95 %
Brady Statistic RV Percent Paced: 0.05 %
Date Time Interrogation Session: 20231114004029
Implantable Lead Connection Status: 753985
Implantable Lead Connection Status: 753985
Implantable Lead Implant Date: 20190501
Implantable Lead Implant Date: 20190501
Implantable Lead Location: 753859
Implantable Lead Location: 753860
Implantable Lead Model: 3830
Implantable Lead Model: 5076
Implantable Pulse Generator Implant Date: 20190501
Lead Channel Impedance Value: 323 Ohm
Lead Channel Impedance Value: 342 Ohm
Lead Channel Impedance Value: 380 Ohm
Lead Channel Impedance Value: 475 Ohm
Lead Channel Pacing Threshold Amplitude: 0.625 V
Lead Channel Pacing Threshold Amplitude: 0.75 V
Lead Channel Pacing Threshold Pulse Width: 0.4 ms
Lead Channel Pacing Threshold Pulse Width: 0.4 ms
Lead Channel Sensing Intrinsic Amplitude: 1.25 mV
Lead Channel Sensing Intrinsic Amplitude: 1.25 mV
Lead Channel Sensing Intrinsic Amplitude: 12.625 mV
Lead Channel Sensing Intrinsic Amplitude: 12.625 mV
Lead Channel Setting Pacing Amplitude: 2 V
Lead Channel Setting Pacing Amplitude: 2.5 V
Lead Channel Setting Pacing Pulse Width: 0.4 ms
Lead Channel Setting Sensing Sensitivity: 0.9 mV
Zone Setting Status: 755011
Zone Setting Status: 755011

## 2022-01-31 NOTE — Telephone Encounter (Signed)
   01/31/22  [name] 1940/10/12  What type of surgery is being performed? Colonoscopy/EGD  When is surgery scheduled? TBD  Needs clearance to hold eliquis x 2 days  Name of physician performing surgery?  Dr. Rubye Oaks [facility] Phone: [phone] Fax: [phone]  Anethesia type (none, local, MAC, general)? MAC

## 2022-02-01 NOTE — Telephone Encounter (Signed)
Patient with diagnosis of atrial fibrillation on Eliquis 5 mg BID for anticoagulation.    Procedure: Colonoscopy/EGD  Date of procedure: TBD   CHA2DS2-VASc Score = 5  This indicates a 7.2% annual risk of stroke. The patient's score is based upon: CHF History: 1 HTN History: 1 Diabetes History: 0 Stroke History: 0 Vascular Disease History: 0 Age Score: 2 Gender Score: 1   CrCl: 47 ml/min  (Cr 0.85 01/12/2022, Adj BW) Platelet count 144 (01/12/2022)  Per office protocol, patient can hold Eliquis 5 mg BID for 2 days prior to procedure.    **This guidance is not considered finalized until pre-operative APP has relayed final recommendations.**   Sandford Craze, PharmD. [facility] Acute Care PGY-1 02/01/2022 3:18 PM

## 2022-02-02 NOTE — Telephone Encounter (Signed)
   Patient Name: [name]  DOB: 06/26/1940 MRN: [id number]  Primary Cardiologist: Candee Furbish, MD  Clinical pharmacists have reviewed the patient's past medical history, labs, and current medications as part of preoperative protocol coverage. The following recommendations have been made:  Patient with diagnosis of atrial fibrillation on Eliquis 5 mg BID for anticoagulation.     Procedure: Colonoscopy/EGD  Date of procedure: TBD     CHA2DS2-VASc Score = 5  This indicates a 7.2% annual risk of stroke. The patient's score is based upon: CHF History: 1 HTN History: 1 Diabetes History: 0 Stroke History: 0 Vascular Disease History: 0 Age Score: 2 Gender Score: 1   CrCl: 47 ml/min  (Cr 0.85 01/12/2022, Adj BW) Platelet count 144 (01/12/2022)   Per office protocol, patient can hold Eliquis for 2 days prior to procedure. Please resume Eliquis as soon as possible postprocedure, at the discretion of the surgeon.   I will route this recommendation to the requesting party via Epic fax function and remove from pre-op pool.  Please call with questions.  Lenna Sciara, NP 02/02/2022, 11:29 AM

## 2022-02-03 DIAGNOSIS — M5416 Radiculopathy, lumbar region: Secondary | ICD-10-CM | POA: Diagnosis not present

## 2022-02-03 DIAGNOSIS — G894 Chronic pain syndrome: Secondary | ICD-10-CM | POA: Diagnosis not present

## 2022-02-03 DIAGNOSIS — N1831 Chronic kidney disease, stage 3a: Secondary | ICD-10-CM | POA: Diagnosis not present

## 2022-02-03 DIAGNOSIS — Z23 Encounter for immunization: Secondary | ICD-10-CM | POA: Diagnosis not present

## 2022-02-07 ENCOUNTER — Encounter: Payer: Self-pay | Admitting: *Deleted

## 2022-02-07 MED ORDER — CLENPIQ 10-3.5-12 MG-GM -GM/175ML PO SOLN: 1.0000 | ORAL | 0 refills | Status: DC

## 2022-02-07 NOTE — Telephone Encounter (Signed)
Thanks [name], please update the patient about these rec

## 2022-02-07 NOTE — Telephone Encounter (Signed)
Fowarding to Dr. Jenetta Downer regarding the clearance for eliquis

## 2022-02-07 NOTE — Telephone Encounter (Signed)
Spoke with [name] daughter and POA at [phone] and patient has been scheduled for 1/16 at 12:45pm. She also requested pt have low volume prep. Aware will send instructions/pre-op appt. Rx for prep sent into [facility]. Also aware pt to hold eliquis 2 days prior.

## 2022-02-08 ENCOUNTER — Encounter: Payer: Self-pay | Admitting: *Deleted

## 2022-02-08 ENCOUNTER — Telehealth: Payer: Self-pay | Admitting: *Deleted

## 2022-02-08 MED ORDER — SUTAB 1479-225-188 MG PO TABS: ORAL_TABLET | ORAL | 0 refills | Status: DC

## 2022-02-08 NOTE — Addendum Note (Signed)
Addended by: Cheron Every on: 02/08/2022 12:13 PM   Modules accepted: Orders

## 2022-02-08 NOTE — Telephone Encounter (Signed)
It should be good, please remind her she needs to drink all the water amount described in the instructions

## 2022-02-08 NOTE — Telephone Encounter (Signed)
Received fax from pharmacy clenpiq not covered. Daughter requested low volume prep. It does state sutab does not require PA. Is this okay Dr. Jenetta Downer? Thanks!

## 2022-02-08 NOTE — Telephone Encounter (Signed)
Daughter aware. New instructions sent.

## 2022-02-15 ENCOUNTER — Other Ambulatory Visit: Payer: Self-pay | Admitting: Internal Medicine

## 2022-02-15 ENCOUNTER — Encounter: Payer: Self-pay | Admitting: Neurology

## 2022-02-15 DIAGNOSIS — I480 Paroxysmal atrial fibrillation: Secondary | ICD-10-CM

## 2022-02-15 NOTE — Telephone Encounter (Signed)
Prescription refill request for Eliquis received. Indication: PAF Last office visit: 11/22/21  Beckie Salts MD Scr: 0.85 on 01/12/22 Age: 81 Weight: 73.4kg  Based on above findings Eliquis '5mg'$  twice daily is the appropriate dose.  Refill approved.

## 2022-03-01 NOTE — Progress Notes (Signed)
Remote pacemaker transmission.   

## 2022-03-08 ENCOUNTER — Telehealth: Payer: Medicare Other | Admitting: Neurology

## 2022-03-09 DIAGNOSIS — Z961 Presence of intraocular lens: Secondary | ICD-10-CM | POA: Diagnosis not present

## 2022-03-09 DIAGNOSIS — H25041 Posterior subcapsular polar age-related cataract, right eye: Secondary | ICD-10-CM | POA: Diagnosis not present

## 2022-03-28 NOTE — Patient Instructions (Signed)
[name]  03/28/2022     '@PREFPERIOPPHARMACY'$ @   Your procedure is scheduled on  04/04/2022.   Report to [facility] at  [place]  A.M.   Call this number if you have problems the morning of surgery:  [phone]  If you experience any cold or flu symptoms such as cough, fever, chills, shortness of breath, etc. between now and your scheduled surgery, please notify us at the above number.   Remember:  Follow the diet and prep instructions given to you by the office.     Your last dose of eliquis should be on 04/01/2022.     Take these medicines the morning of surgery with A SIP OF WATER             arimidex, atenolol, gabapentin, lansoprazole, levothyroxine, oxycodone(if needed), prednisone, mestinon, imitrex(if needed).     Do not wear jewelry, make-up or nail polish.  Do not wear lotions, powders, or perfumes, or deodorant.  Do not shave 48 hours prior to surgery.  Men may shave face and neck.  Do not bring valuables to the hospital.  [facility] is not responsible for any belongings or valuables.  Contacts, dentures or bridgework may not be worn into surgery.  Leave your suitcase in the car.  After surgery it may be brought to your room.  For patients admitted to the hospital, discharge time will be determined by your treatment team.  Patients discharged the day of surgery will not be allowed to drive home and must have someone with them for 24 hours.    Special instructions:   DO NOT smoke tobacco or vape for 24 hours before your procedure.  Please read over the following fact sheets that you were given. Anesthesia Post-op Instructions and Care and Recovery After Surgery      Upper Endoscopy, Adult, Care After After the procedure, it is common to have a sore throat. It is also common to have: Mild stomach pain or discomfort. Bloating. Nausea. Follow these instructions at home: The instructions below may help you care for yourself at home. Your health  care provider may give you more instructions. If you have questions, ask your health care provider. If you were given a sedative during the procedure, it can affect you for several hours. Do not drive or operate machinery until your health care provider says that it is safe. If you will be going home right after the procedure, plan to have a responsible adult: Take you home from the hospital or clinic. You will not be allowed to drive. Care for you for the time you are told. Follow instructions from your health care provider about what you may eat and drink. Return to your normal activities as told by your health care provider. Ask your health care provider what activities are safe for you. Take over-the-counter and prescription medicines only as told by your health care provider. Contact a health care provider if you: Have a sore throat that lasts longer than one day. Have trouble swallowing. Have a fever. Get help right away if you: Vomit blood or your vomit looks like coffee grounds. Have bloody, black, or tarry stools. Have a very bad sore throat or you cannot swallow. Have difficulty breathing or very bad pain in your chest or abdomen. These symptoms may be an emergency. Get help right away. Call 911. Do not wait to see if the symptoms will go away. Do not drive yourself to  the hospital. Summary After the procedure, it is common to have a sore throat, mild stomach discomfort, bloating, and nausea. If you were given a sedative during the procedure, it can affect you for several hours. Do not drive until your health care provider says that it is safe. Follow instructions from your health care provider about what you may eat and drink. Return to your normal activities as told by your health care provider. This information is not intended to replace advice given to you by your health care provider. Make sure you discuss any questions you have with your health care provider. Document  Revised: 06/15/2021 Document Reviewed: 06/15/2021 Elsevier Patient Education  [place]. Colonoscopy, Adult, Care After The following information offers guidance on how to care for yourself after your procedure. Your health care provider may also give you more specific instructions. If you have problems or questions, contact your health care provider. What can I expect after the procedure? After the procedure, it is common to have: A small amount of blood in your stool for 24 hours after the procedure. Some gas. Mild cramping or bloating of your abdomen. Follow these instructions at home: Eating and drinking  Drink enough fluid to keep your urine pale yellow. Follow instructions from your health care provider about eating or drinking restrictions. Resume your normal diet as told by your health care provider. Avoid heavy or fried foods that are hard to digest. Activity Rest as told by your health care provider. Avoid sitting for a long time without moving. Get up to take short walks every 1-2 hours. This is important to improve blood flow and breathing. Ask for help if you feel weak or unsteady. Return to your normal activities as told by your health care provider. Ask your health care provider what activities are safe for you. Managing cramping and bloating  Try walking around when you have cramps or feel bloated. If directed, apply heat to your abdomen as told by your health care provider. Use the heat source that your health care provider recommends, such as a moist heat pack or a heating pad. Place a towel between your skin and the heat source. Leave the heat on for 20-30 minutes. Remove the heat if your skin turns bright red. This is especially important if you are unable to feel pain, heat, or cold. You have a greater risk of getting burned. General instructions If you were given a sedative during the procedure, it can affect you for several hours. Do not drive or operate  machinery until your health care provider says that it is safe. For the first 24 hours after the procedure: Do not sign important documents. Do not drink alcohol. Do your regular daily activities at a slower pace than normal. Eat soft foods that are easy to digest. Take over-the-counter and prescription medicines only as told by your health care provider. Keep all follow-up visits. This is important. Contact a health care provider if: You have blood in your stool 2-3 days after the procedure. Get help right away if: You have more than a small spotting of blood in your stool. You have large blood clots in your stool. You have swelling of your abdomen. You have nausea or vomiting. You have a fever. You have increasing pain in your abdomen that is not relieved with medicine. These symptoms may be an emergency. Get help right away. Call 911. Do not wait to see if the symptoms will go away. Do not drive yourself to  the hospital. Summary After the procedure, it is common to have a small amount of blood in your stool. You may also have mild cramping and bloating of your abdomen. If you were given a sedative during the procedure, it can affect you for several hours. Do not drive or operate machinery until your health care provider says that it is safe. Get help right away if you have a lot of blood in your stool, nausea or vomiting, a fever, or increased pain in your abdomen. This information is not intended to replace advice given to you by your health care provider. Make sure you discuss any questions you have with your health care provider. Document Revised: 10/27/2020 Document Reviewed: 10/27/2020 Elsevier Patient Education  [place] After The following information offers guidance on how to care for yourself after your procedure. Your health care provider may also give you more specific instructions. If you have problems or questions, contact your  health care provider. What can I expect after the procedure? After the procedure, it is common to have: Tiredness. Little or no memory about what happened during or after the procedure. Impaired judgment when it comes to making decisions. Nausea or vomiting. Some trouble with balance. Follow these instructions at home: For the time period you were told by your health care provider:  Rest. Do not participate in activities where you could fall or become injured. Do not drive or use machinery. Do not drink alcohol. Do not take sleeping pills or medicines that cause drowsiness. Do not make important decisions or sign legal documents. Do not take care of children on your own. Medicines Take over-the-counter and prescription medicines only as told by your health care provider. If you were prescribed antibiotics, take them as told by your health care provider. Do not stop using the antibiotic even if you start to feel better. Eating and drinking Follow instructions from your health care provider about what you may eat and drink. Drink enough fluid to keep your urine pale yellow. If you vomit: Drink clear fluids slowly and in small amounts as you are able. Clear fluids include water, ice chips, low-calorie sports drinks, and fruit juice that has water added to it (diluted fruit juice). Eat light and bland foods in small amounts as you are able. These foods include bananas, applesauce, rice, lean meats, toast, and crackers. General instructions  Have a responsible adult stay with you for the time you are told. It is important to have someone help care for you until you are awake and alert. If you have sleep apnea, surgery and some medicines can increase your risk for breathing problems. Follow instructions from your health care provider about wearing your sleep device: When you are sleeping. This includes during daytime naps. While taking prescription pain medicines, sleeping medicines, or  medicines that make you drowsy. Do not use any products that contain nicotine or tobacco. These products include cigarettes, chewing tobacco, and vaping devices, such as e-cigarettes. If you need help quitting, ask your health care provider. Contact a health care provider if: You feel nauseous or vomit every time you eat or drink. You feel light-headed. You are still sleepy or having trouble with balance after 24 hours. You get a rash. You have a fever. You have redness or swelling around the IV site. Get help right away if: You have trouble breathing. You have new confusion after you get home. These symptoms may be an emergency. Get help right away.  Call 911. Do not wait to see if the symptoms will go away. Do not drive yourself to the hospital. This information is not intended to replace advice given to you by your health care provider. Make sure you discuss any questions you have with your health care provider. Document Revised: 08/01/2021 Document Reviewed: 08/01/2021 Elsevier Patient Education  [place].

## 2022-03-29 ENCOUNTER — Encounter: Payer: Self-pay | Admitting: Internal Medicine

## 2022-03-29 ENCOUNTER — Telehealth: Payer: Self-pay | Admitting: *Deleted

## 2022-03-29 DIAGNOSIS — J189 Pneumonia, unspecified organism: Secondary | ICD-10-CM | POA: Diagnosis not present

## 2022-03-29 DIAGNOSIS — F5105 Insomnia due to other mental disorder: Secondary | ICD-10-CM | POA: Diagnosis not present

## 2022-03-29 DIAGNOSIS — M5416 Radiculopathy, lumbar region: Secondary | ICD-10-CM | POA: Diagnosis not present

## 2022-03-29 DIAGNOSIS — F99 Mental disorder, not otherwise specified: Secondary | ICD-10-CM | POA: Diagnosis not present

## 2022-03-29 NOTE — Progress Notes (Signed)
[facility] DEVICE PROGRAMMING  Patient Information: Name:  [name]  DOB:  1940/07/15  MRN:  [id number]    COLONOSCOPY WITH PROPOFOL  ESOPHAGOGASTRODUODENOSCOPY (EGD) WITH PROPOFOL  Harvel Quale, MD  04/04/2022  Device Information:  Clinic EP Physician:  Cristopher Peru, MD   Device Type:  Pacemaker Manufacturer and Phone #:  Medtronic: [phone] Pacemaker Dependent?:  No. Date of Last Device Check:  03/21/2022 Normal Device Function?:  Yes.    Electrophysiologist's Recommendations:  Have magnet available. Provide continuous ECG monitoring when magnet is used or reprogramming is to be performed.  Procedure should not interfere with device function.  No device programming or magnet placement needed.  Per Device Clinic Standing Orders, Wanda Plump, RN  4:49 PM 03/29/2022

## 2022-03-29 NOTE — Telephone Encounter (Signed)
Pt daughter susan called in. Pt diagnosed with pneumonia and wants to cancel procedure for next week. Once pt is feeling better she will call us to have pt rescheduled. Sent endo a message to make aware. FYI Dr. Jenetta Downer

## 2022-03-29 NOTE — Telephone Encounter (Signed)
Thanks for the update, hope she has a smooth recover.

## 2022-03-31 ENCOUNTER — Encounter (HOSPITAL_COMMUNITY)
Admission: RE | Admit: 2022-03-31 | Discharge: 2022-03-31 | Disposition: A | Discharge status: Home / Self Care | Payer: Medicare Other | Source: Ambulatory Visit | Attending: Gastroenterology | Admitting: Gastroenterology

## 2022-03-31 ENCOUNTER — Encounter (HOSPITAL_COMMUNITY): Payer: Self-pay

## 2022-03-31 DIAGNOSIS — N183 Chronic kidney disease, stage 3 unspecified: Secondary | ICD-10-CM

## 2022-03-31 DIAGNOSIS — D649 Anemia, unspecified: Secondary | ICD-10-CM

## 2022-04-04 ENCOUNTER — Encounter (HOSPITAL_COMMUNITY): Admission: RE | Payer: Self-pay | Source: Ambulatory Visit

## 2022-04-04 ENCOUNTER — Ambulatory Visit (HOSPITAL_COMMUNITY): Admission: RE | Admit: 2022-04-04 | Payer: Medicare Other | Source: Ambulatory Visit | Admitting: Gastroenterology

## 2022-04-04 SURGERY — COLONOSCOPY WITH PROPOFOL
Anesthesia: Monitor Anesthesia Care

## 2022-05-02 ENCOUNTER — Ambulatory Visit: Payer: Medicare Other

## 2022-05-02 DIAGNOSIS — I495 Sick sinus syndrome: Secondary | ICD-10-CM | POA: Diagnosis not present

## 2022-05-02 LAB — CUP PACEART REMOTE DEVICE CHECK
Battery Remaining Longevity: 99 mo
Battery Voltage: 2.99 V
Brady Statistic AP VP Percent: 0.04 %
Brady Statistic AP VS Percent: 97.2 %
Brady Statistic AS VP Percent: 0 %
Brady Statistic AS VS Percent: 2.76 %
Brady Statistic RA Percent Paced: 97.34 %
Brady Statistic RV Percent Paced: 0.04 %
Date Time Interrogation Session: 20240213004001
Implantable Lead Connection Status: 753985
Implantable Lead Connection Status: 753985
Implantable Lead Implant Date: 20190501
Implantable Lead Implant Date: 20190501
Implantable Lead Location: 753859
Implantable Lead Location: 753860
Implantable Lead Model: 3830
Implantable Lead Model: 5076
Implantable Pulse Generator Implant Date: 20190501
Lead Channel Impedance Value: 323 Ohm
Lead Channel Impedance Value: 342 Ohm
Lead Channel Impedance Value: 380 Ohm
Lead Channel Impedance Value: 475 Ohm
Lead Channel Pacing Threshold Amplitude: 0.625 V
Lead Channel Pacing Threshold Amplitude: 0.625 V
Lead Channel Pacing Threshold Pulse Width: 0.4 ms
Lead Channel Pacing Threshold Pulse Width: 0.4 ms
Lead Channel Sensing Intrinsic Amplitude: 1 mV
Lead Channel Sensing Intrinsic Amplitude: 1 mV
Lead Channel Sensing Intrinsic Amplitude: 12.25 mV
Lead Channel Sensing Intrinsic Amplitude: 12.25 mV
Lead Channel Setting Pacing Amplitude: 2 V
Lead Channel Setting Pacing Amplitude: 2.5 V
Lead Channel Setting Pacing Pulse Width: 0.4 ms
Lead Channel Setting Sensing Sensitivity: 0.9 mV
Zone Setting Status: 755011
Zone Setting Status: 755011

## 2022-05-18 ENCOUNTER — Other Ambulatory Visit: Payer: Self-pay | Admitting: Hematology and Oncology

## 2022-05-19 DIAGNOSIS — G894 Chronic pain syndrome: Secondary | ICD-10-CM | POA: Diagnosis not present

## 2022-05-19 DIAGNOSIS — R531 Weakness: Secondary | ICD-10-CM | POA: Diagnosis not present

## 2022-05-19 DIAGNOSIS — Z8701 Personal history of pneumonia (recurrent): Secondary | ICD-10-CM | POA: Diagnosis not present

## 2022-05-19 DIAGNOSIS — R630 Anorexia: Secondary | ICD-10-CM | POA: Diagnosis not present

## 2022-05-19 DIAGNOSIS — M5416 Radiculopathy, lumbar region: Secondary | ICD-10-CM | POA: Diagnosis not present

## 2022-05-19 DIAGNOSIS — Z760 Encounter for issue of repeat prescription: Secondary | ICD-10-CM | POA: Diagnosis not present

## 2022-05-22 ENCOUNTER — Telehealth: Payer: Self-pay | Admitting: Internal Medicine

## 2022-05-22 DIAGNOSIS — Z8701 Personal history of pneumonia (recurrent): Secondary | ICD-10-CM | POA: Diagnosis not present

## 2022-05-22 NOTE — Telephone Encounter (Signed)
Pt daughter called in asking if its safe for pt to use CPAP with magnets. She was concerned because pt has a pacemaker. Please advise.

## 2022-05-24 NOTE — Telephone Encounter (Signed)
Spoke with patients daughter she stated that the magnet is where the CPAP hooks on her face informed her that this would be fine, and that lots of patients with pacemaker wear CPAP machines.

## 2022-05-30 DIAGNOSIS — G4733 Obstructive sleep apnea (adult) (pediatric): Secondary | ICD-10-CM | POA: Diagnosis not present

## 2022-06-05 NOTE — Progress Notes (Signed)
Remote pacemaker transmission.   

## 2022-06-15 DIAGNOSIS — B9689 Other specified bacterial agents as the cause of diseases classified elsewhere: Secondary | ICD-10-CM | POA: Diagnosis not present

## 2022-06-15 DIAGNOSIS — J019 Acute sinusitis, unspecified: Secondary | ICD-10-CM | POA: Diagnosis not present

## 2022-06-21 ENCOUNTER — Other Ambulatory Visit: Payer: Self-pay | Admitting: Student

## 2022-07-11 DIAGNOSIS — I4891 Unspecified atrial fibrillation: Secondary | ICD-10-CM | POA: Diagnosis not present

## 2022-07-11 DIAGNOSIS — C755 Malignant neoplasm of aortic body and other paraganglia: Secondary | ICD-10-CM | POA: Diagnosis not present

## 2022-07-11 DIAGNOSIS — C50911 Malignant neoplasm of unspecified site of right female breast: Secondary | ICD-10-CM | POA: Diagnosis not present

## 2022-07-11 DIAGNOSIS — Z950 Presence of cardiac pacemaker: Secondary | ICD-10-CM | POA: Diagnosis not present

## 2022-07-11 DIAGNOSIS — Z1589 Genetic susceptibility to other disease: Secondary | ICD-10-CM | POA: Diagnosis not present

## 2022-07-25 ENCOUNTER — Ambulatory Visit: Payer: Medicare Other | Admitting: Neurology

## 2022-08-01 ENCOUNTER — Ambulatory Visit (INDEPENDENT_AMBULATORY_CARE_PROVIDER_SITE_OTHER): Payer: Medicare Other

## 2022-08-01 DIAGNOSIS — I495 Sick sinus syndrome: Secondary | ICD-10-CM

## 2022-08-02 LAB — CUP PACEART REMOTE DEVICE CHECK
Battery Remaining Longevity: 96 mo
Battery Voltage: 2.99 V
Brady Statistic AP VP Percent: 0.05 %
Brady Statistic AP VS Percent: 97.73 %
Brady Statistic AS VP Percent: 0 %
Brady Statistic AS VS Percent: 2.22 %
Brady Statistic RA Percent Paced: 97.97 %
Brady Statistic RV Percent Paced: 0.05 %
Date Time Interrogation Session: 20240514014113
Implantable Lead Connection Status: 753985
Implantable Lead Connection Status: 753985
Implantable Lead Implant Date: 20190501
Implantable Lead Implant Date: 20190501
Implantable Lead Location: 753859
Implantable Lead Location: 753860
Implantable Lead Model: 3830
Implantable Lead Model: 5076
Implantable Pulse Generator Implant Date: 20190501
Lead Channel Impedance Value: 342 Ohm
Lead Channel Impedance Value: 361 Ohm
Lead Channel Impedance Value: 380 Ohm
Lead Channel Impedance Value: 494 Ohm
Lead Channel Pacing Threshold Amplitude: 0.5 V
Lead Channel Pacing Threshold Amplitude: 0.625 V
Lead Channel Pacing Threshold Pulse Width: 0.4 ms
Lead Channel Pacing Threshold Pulse Width: 0.4 ms
Lead Channel Sensing Intrinsic Amplitude: 1 mV
Lead Channel Sensing Intrinsic Amplitude: 1 mV
Lead Channel Sensing Intrinsic Amplitude: 11.75 mV
Lead Channel Sensing Intrinsic Amplitude: 11.75 mV
Lead Channel Setting Pacing Amplitude: 2 V
Lead Channel Setting Pacing Amplitude: 2.5 V
Lead Channel Setting Pacing Pulse Width: 0.4 ms
Lead Channel Setting Sensing Sensitivity: 0.9 mV
Zone Setting Status: 755011
Zone Setting Status: 755011

## 2022-08-07 ENCOUNTER — Ambulatory Visit (INDEPENDENT_AMBULATORY_CARE_PROVIDER_SITE_OTHER): Payer: Medicare Other | Admitting: Physician Assistant

## 2022-08-07 ENCOUNTER — Ambulatory Visit (INDEPENDENT_AMBULATORY_CARE_PROVIDER_SITE_OTHER): Payer: Medicare Other | Admitting: Sports Medicine

## 2022-08-07 ENCOUNTER — Other Ambulatory Visit: Payer: Self-pay

## 2022-08-07 ENCOUNTER — Encounter: Payer: Self-pay | Admitting: Physician Assistant

## 2022-08-07 DIAGNOSIS — G8929 Other chronic pain: Secondary | ICD-10-CM | POA: Diagnosis not present

## 2022-08-07 DIAGNOSIS — M25511 Pain in right shoulder: Secondary | ICD-10-CM | POA: Diagnosis not present

## 2022-08-07 DIAGNOSIS — M19011 Primary osteoarthritis, right shoulder: Secondary | ICD-10-CM

## 2022-08-07 MED ORDER — LIDOCAINE HCL 1 % IJ SOLN
2.0000 mL | INTRAMUSCULAR | Status: AC | PRN
Start: 2022-08-07 — End: 2022-08-07
  Administered 2022-08-07: 2 mL

## 2022-08-07 MED ORDER — BUPIVACAINE HCL 0.25 % IJ SOLN
2.0000 mL | INTRAMUSCULAR | Status: AC | PRN
Start: 2022-08-07 — End: 2022-08-07
  Administered 2022-08-07: 2 mL via INTRA_ARTICULAR

## 2022-08-07 MED ORDER — METHYLPREDNISOLONE ACETATE 40 MG/ML IJ SUSP
80.0000 mg | INTRAMUSCULAR | Status: AC | PRN
Start: 2022-08-07 — End: 2022-08-07
  Administered 2022-08-07: 80 mg via INTRA_ARTICULAR

## 2022-08-07 NOTE — Progress Notes (Signed)
   Procedure Note  Patient: [name]             Date of Birth: [birth year]           MRN: [id number]             Visit Date: 08/07/2022  Procedures: Visit Diagnoses:  1. Chronic right shoulder pain    Large Joint Inj: R glenohumeral on 08/07/2022 4:03 PM Indications: pain Details: 22 G 3.5 in needle, ultrasound-guided posterior approach Medications: 2 mL lidocaine 1 %; 2 mL bupivacaine 0.25 %; 80 mg methylPREDNISolone acetate 40 MG/ML Outcome: tolerated well, no immediate complications  US-guided glenohumeral joint injection, right shoulder After discussion on risks/benefits/indications, informed verbal consent was obtained. A timeout was then performed. The patient was positioned lying lateral recumbent on examination table. The patient's shoulder was prepped with betadine and multiple alcohol swabs and utilizing ultrasound guidance, the patient's glenohumeral joint was identified on ultrasound. Using ultrasound guidance a 22-gauge, 3.5 inch needle with a mixture of 2:2:2 cc's lidocaine:bupivicaine:depomedrol was directed from a lateral to medial direction via in-plane technique into the glenohumeral joint with visualization of appropriate spread of injectate into the joint. Patient tolerated the procedure well without immediate complications.      Procedure, treatment alternatives, risks and benefits explained, specific risks discussed. Consent was given by the patient. Immediately prior to procedure a time out was called to verify the correct patient, procedure, equipment, support staff and site/side marked as required. Patient was prepped and draped in the usual sterile fashion.    - I evaluated the patient about 10 minutes post-injection and she had improvement in pain and range of motion - follow-up with Mary-Anne PA-C as indicated; I am happy to see them as needed - gave her wall-walk exercises to work on in the interim, to begin Wednesday  Madelyn Brunner, DO Primary Care Sports  Medicine Physician  [facility] - Orthopedics  This note was dictated using Dragon naturally speaking software and may contain errors in syntax, spelling, or content which have not been identified prior to signing this note.

## 2022-08-07 NOTE — Progress Notes (Signed)
Office Visit Note   Patient: [name]           Date of Birth: [birth year]           MRN: [id number] Visit Date: 08/07/2022              Requested by: Roe Rutherford, NP [address] [place],  [place] [address] PCP: Roe Rutherford, NP  Chief Complaint  Patient presents with   Right Shoulder - Pain      HPI: [name] is a pleasant 82 year old woman who is right-hand dominant.  She is accompanied by her daughter today.  She has had ongoing problems and pain in her right shoulder.  She had seen Dr. Otelia Sergeant for this in the past a couple years ago.  A CT scan did demonstrate advanced degenerative changes of the glenohumeral joint with rotator cuff disruption.  She was seen by [facility] who said that she needed a shoulder replacement.  She does not want to do that.  She comes in today requesting an injection.  Assessment & Plan: Visit Diagnoses: Osteoarthritis right shoulder  Plan: I think she would benefit from a injection into the glenohumeral joint.  She does have findings consistent with rotator cuff disruption.  She cannot bring her arm to full forward elevation with the assist of her other hand.  Per her daughter sometimes she does not need to do this.  She is fairly functional and enjoys gardening and working in the yard however pain right now is limiting.  Will ask if Dr. Shon Baton could give her a glenohumeral injection today.  From a surgical standpoint she would need a reverse shoulder replacement but she is not considering surgery at this time  Follow-Up Instructions: No follow-ups on file.   Ortho Exam  Patient is alert, oriented, no adenopathy, well-dressed, normal affect, normal respiratory effort. Right shoulder no erythema.  She has active assisted forward elevation to 160 degrees decreased internal rotation behind her back her strength is limited.  She is neurovascular intact she has a strong pulse.  No real tenderness or impingement findings  Imaging: No  results found. No images are attached to the encounter.  Labs: Lab Results  Component Value Date   HGBA1C 5.3 01/27/2017   ESRSEDRATE 53 (H) 11/28/2018   ESRSEDRATE 2 01/26/2014   ESRSEDRATE 14 12/06/2011   LABURIC 5.2 05/04/2015   REPTSTATUS 02/10/2015 FINAL 02/09/2015   CULT  02/09/2015    1,000 COLONIES/mL INSIGNIFICANT GROWTH Performed at [facility]    [facility] KLEBSIELLA PNEUMONIAE 04/28/2014     Lab Results  Component Value Date   ALBUMIN 4.2 01/12/2022   ALBUMIN 4.2 12/28/2020   ALBUMIN 4.1 03/16/2020    Lab Results  Component Value Date   MG 2.3 09/19/2017   MG 2.0 05/04/2015   MG 1.8 04/05/2014   No results found for: "VD25OH"  No results found for: "PREALBUMIN"    Latest Ref Rng & Units 01/12/2022   11:37 AM 12/28/2020   12:03 PM 03/16/2020    3:50 PM  CBC EXTENDED  WBC 3.4 - 10.8 x10E3/uL 6.1  8.4  8.3   RBC 3.77 - 5.28 x10E6/uL 3.83  4.15  4.08   Hemoglobin 11.1 - 15.9 g/dL 19.1  47.8  29.5   HCT 34.0 - 46.6 % 38.7  40.9  40.5   Platelets 150 - 450 x10E3/uL 144  164  175   NEUT# 1.4 - 7.0 x10E3/uL 2.9  5.0  6.5   Lymph# 0.7 - 3.1 x10E3/uL 2.5  2.6  1.3      There is no height or weight on file to calculate BMI.  Orders:  No orders of the defined types were placed in this encounter.  No orders of the defined types were placed in this encounter.    Procedures: No procedures performed  Clinical Data: No additional findings.  ROS:  All other systems negative, except as noted in the HPI. Review of Systems  Objective: Vital Signs: There were no vitals taken for this visit.  Specialty Comments:  No specialty comments available.  PMFS History: Patient Active Problem List   Diagnosis Date Noted   Sorethroat 07/26/2021   Myasthenia gravis (HCC) 07/12/2021   Gait abnormality 07/12/2021   Insomnia due to other mental disorder 07/01/2021   Lumbar radiculopathy, chronic 07/01/2021   History of breast cancer 05/30/2021    Status post partial mastectomy of right breast 05/30/2021   Abnormal defecation 01/25/2021   Chronic pain syndrome 10/12/2020   Immunodeficiency due to drugs (HCC) 10/12/2020   Varicose veins of both lower extremities 10/12/2020   Infiltrating ductal carcinoma of right breast (HCC) 06/28/2020   Malignant neoplasm of upper-outer quadrant of right breast in female, estrogen receptor positive (HCC) 10/22/2019   Atypical chest pain 09/23/2018   Dilated bile duct 09/23/2018   Pain in left knee 11/13/2017   Sinus node dysfunction (HCC) 07/18/2017   Dyspnea on exertion 06/25/2017   Stage 3 chronic kidney disease (HCC) 01/16/2017   Small vessel disease, cerebrovascular 07/11/2016   Other fatigue 01/25/2016   Risk for falls 01/25/2016   Paraganglioma, malignant (HCC) 11/06/2015   Nodule of right lung 09/13/2015   Monoallelic mutation of SDHC gene 08/20/2015   Hypertensive cardiovascular disease 05/25/2014   Malnutrition of moderate degree (HCC) 04/28/2014   Thrombocytopenia (HCC) 04/28/2014   UTI (urinary tract infection) 04/28/2014   Physical deconditioning 04/27/2014   Ocular myasthenia (HCC) 04/27/2014   Depression with anxiety 04/27/2014   Chronic diastolic CHF (congestive heart failure) (HCC) 04/27/2014   Bradycardia    Orthostatic hypotension    Elevated MCV 04/20/2014   Family history of vitamin B12 deficiency 04/20/2014   H/O osteoporosis 04/20/2014   Vitamin D deficiency 04/20/2014   AKI (acute kidney injury) (HCC) 04/05/2014   Palpitations 04/04/2014   Paroxysmal atrial fibrillation (HCC) 04/04/2014   Chest pain 04/01/2014   Sinus bradycardia 04/01/2014   Loss of weight 06/10/2012   Abdominal pain, bilateral lower quadrant 09/26/2011   Hypertension 07/26/2011   Hypothyroidism 07/26/2011   Arthritis 07/26/2011   High cholesterol 07/26/2011   GERD (gastroesophageal reflux disease) 07/26/2011   Rectal bleeding 07/26/2011   IBS (irritable bowel syndrome) 07/26/2011   Past  Medical History:  Diagnosis Date   Anemia    Atrial fibrillation (HCC)    Breast cancer (HCC) 09/2019   right breast    Chronic back pain    Chronic nausea    Complication of anesthesia    hypotension, elevated heart rate and oxygenation desaturation   Depression with anxiety    Diverticulosis    Dyspnea    due to myasthenia gravis   Essential hypertension    GERD (gastroesophageal reflux disease)    History of pneumonia    Hypothyroidism    IBS (irritable colon syndrome)    LVH (left ventricular hypertrophy)    Migraines    Mixed hyperlipidemia    Obesity    Ocular myasthenia gravis (HCC)    Osteoarthritis  of knee    Pacemaker    PAF (paroxysmal atrial fibrillation) (HCC)    chads2vasc score is at least 4   Paraganglioma [facility])    Resection 02/2012 (retroperitoneal)   Paraganglioma, malignant (HCC) 11/06/2015   Presence of permanent cardiac pacemaker 07/18/2017   Renal insufficiency    Skin cancer     Family History  Problem Relation Age of Onset   Inflammatory bowel disease Maternal Grandmother    Sleep apnea Daughter    Sleep apnea Daughter    Sleep apnea Son     Past Surgical History:  Procedure Laterality Date   ABDOMINAL HYSTERECTOMY     BREAST BIOPSY Right 09/2019   BREAST LUMPECTOMY Right 10/28/2019   CARDIAC CATHETERIZATION     CHOLECYSTECTOMY     COLONOSCOPY  08/24/2011   Procedure: COLONOSCOPY;  Surgeon: Malissa Hippo, MD;  Location: [facility];  Service: Endoscopy;  Laterality: N/A;  1200   CT guided biopsy  01/16/12   GIVENS CAPSULE STUDY  01/29/2012   Procedure: GIVENS CAPSULE STUDY;  Surgeon: Malissa Hippo, MD;  Location: [facility];  Service: Endoscopy;  Laterality: N/A;  730   KNEE SURGERY     LAPAROTOMY  03/01/2012   Procedure: EXPLORATORY LAPAROTOMY;  Surgeon: Velora Heckler, MD;  Location: [facility] ORS;  Service: General;  Laterality: N/A;  Exploratory Laparotomy ,Resection Retroperitoneal Mass   MASTECTOMY, PARTIAL Right 10/28/2019    Procedure: RIGHT BREAST PARTIAL MASTECTOMY;  Surgeon: Darnell Level, MD;  Location: [facility];  Service: General;  Laterality: Right;   NASAL SINUS SURGERY     30 yrs ago   PACEMAKER IMPLANT N/A 07/18/2017   Procedure: PACEMAKER IMPLANT;  Surgeon: Marinus Maw, MD;  Location: [facility] INVASIVE CV LAB;  Service: Cardiovascular;  Laterality: N/A;   RIGHT/LEFT HEART CATH AND CORONARY ANGIOGRAPHY N/A 11/18/2018   Procedure: RIGHT/LEFT HEART CATH AND CORONARY ANGIOGRAPHY;  Surgeon: Lyn Records, MD;  Location: [facility] INVASIVE CV LAB;  Service: Cardiovascular;  Laterality: N/A;   TOTAL KNEE ARTHROPLASTY Bilateral    2005 and 2011   Social History   Occupational History   Occupation: Retired Administrator, arts  Tobacco Use   Smoking status: Never    Passive exposure: Never   Smokeless tobacco: Never  Vaping Use   Vaping Use: Never used  Substance and Sexual Activity   Alcohol use: No    Alcohol/week: 0.0 standard drinks of alcohol   Drug use: No   Sexual activity: Not on file

## 2022-08-10 NOTE — Progress Notes (Signed)
Patient Care Team: Roe Rutherford, NP as PCP - General (Adult Health Nurse Practitioner) Jake Bathe, MD as PCP - Cardiology (Cardiology) Valetta Fuller, Brand Males, NP (Inactive) as Nurse Practitioner (Internal Medicine) Serena Croissant, MD as Consulting Physician (Hematology and Oncology) Darnell Level, MD as Consulting Physician (General Surgery) Donzetta Starch, MD as Consulting Physician (Dermatology)  DIAGNOSIS: No diagnosis found.  SUMMARY OF ONCOLOGIC HISTORY: Oncology History Overview Note  SDHC Gene Abnormality Annual 24 hr urine collection for VMA's and catecholamines   Paraganglioma, malignant (HCC)  01/01/2012 Imaging   CT abdomen/pelvis no acute findings identified within the abdomen/pelvis. Slight increase in size of enlarge and enhancing periaortic LN   01/10/2012 PET scan   Intensely hypermetabolic solitary L periaortic LN is concerning for a lymphoproliferative process vs a solitary metastasis. Diffuse intensely hypermetabolic thyroid activity is most consistent with a benign thyroiditis    01/16/2012 Initial Biopsy   IR CT guided biopsy of L para aortic lymph node   01/16/2012 Pathology Results   Low grade neoplasm with neuroendocrine differentiation. DDX includes paraganglioma, pheochromocytoma, and carcinoid   01/30/2012 Imaging   US soft tissue head/neck diffusely enlarged hypervascular nodular thyroid gland compatible with thyroiditis   03/01/2012 Surgery   Exploratory laparotomy, resection of retroperitoneal neuroendocrine tumor 3 x 2 x 2 cm with Dr. Darnell Level   03/01/2012 Pathology Results   Paraganglioma with associated fibrosis 2.5 cm, negative margins   04/17/2014 PET scan   There is no evidence for residual or recurrent hypermetabolic tumor or adenopathy. 2. Diffusely increased radiotracer uptake throughout both lobes of the thyroid gland. Correlation with patient's TSH is recommended.    09/10/2015 Imaging   MRI neck with/without contrast at  [facility]. No neck mass or LAD. Likely mildly asymmetric lingual tonsil tissue in the L vallecular. 1.3 cm likely calcified L thyroid nodule.    10/08/2015 Imaging   CT C/A/P No evidence for residual or recurrent tumor or adenopathy.   06/15/2016 Imaging   MRI brain- No acute abnormality.  Negative for metastatic disease   Several small subcortical white matter hyperintensities have developed since 2013. This is most likely related to chronic microvascular ischemia.   06/15/2016 Imaging   CT CAP-   1. No evidence of thoracic metastasis. 1. No evidence of metastatic disease in the abdomen pelvis. 2. No periaortic adenopathy. 3. Sigmoid diverticulosis.  No diverticulitis.   Malignant neoplasm of upper-outer quadrant of right breast in female, estrogen receptor positive (HCC)  10/14/2019 Cancer Staging   Staging form: Breast, AJCC 8th Edition - Clinical stage from 10/14/2019: Stage IA (cT1b, cN0, cM0, G2, ER+, PR-, HER2-)   10/22/2019 Initial Diagnosis   Screening mammogram showed a right breast mass. Diagnostic mammogram and US showed a 1.0cm right breast mass at the 10 o'clock position, no right axillary adenopathy. Biopsy showed IDC with DCIS, grade 2, HER-2 equivocal by IHC, negative by FISH, ER+ 95%, PR- 0%, Ki67 2%.   10/28/2019 Surgery   Right lumpectomy ([facility]) ([id number]): IDC, 1.4cm, grade 2,  with intermediate grade DCIS. Clear margins. No regional lymph nodes were examined. ER+, PR-, HER-2 -   10/28/2019 Cancer Staging   Staging form: Breast, AJCC 8th Edition - Pathologic stage from 10/28/2019: Stage IA (pT1c, pN0, cM0, G2, ER+, PR-, HER2-)   10/2019 - 10/2024 Anti-estrogen oral therapy   Anastrozole     CHIEF COMPLIANT: Follow-up of right breast cancer on anastrozole    INTERVAL HISTORY: [name] is a  82 y.o. with above-mentioned history  of right breast cancer. She presents to the clinic today for a follow-up.    ALLERGIES:  is allergic to nitrofurantoin  macrocrystal, ampicillin, avocado, biaxin [clarithromycin], doxycycline monohydrate, moxifloxacin, sensi-care protective barrier [petrolatum-zinc oxide], and tetracyclines & related.  MEDICATIONS:  Current Outpatient Medications  Medication Sig Dispense Refill   ALPRAZolam (XANAX) 0.5 MG tablet Take 1 mg by mouth 2 (two) times daily. Takes as needed.     anastrozole (ARIMIDEX) 1 MG tablet TAKE (1) TABLET BY MOUTH ONCE DAILY. 90 tablet 0   apixaban (ELIQUIS) 5 MG TABS tablet TAKE (1) TABLET BY MOUTH TWICE DAILY. 60 tablet 5   Artificial Tear Solution (SOOTHE XP OP) Place 1 drop into both eyes 4 (four) times daily.     atenolol (TENORMIN) 25 MG tablet TAKE (1) TABLET BY MOUTH ONCE DAILY. MAY TAKE AN ADDITIONAL TABLET IF SYSTOLIC OVER 150 OR SEVERE PALPITATIONS. 100 tablet 0   atorvastatin (LIPITOR) 40 MG tablet Take 40 mg by mouth daily.     B Complex-C (B-COMPLEX WITH VITAMIN C) tablet Take 1 tablet by mouth daily with supper.      baclofen (LIORESAL) 10 MG tablet Take 0.5 tablets (5 mg total) by mouth at bedtime as needed for muscle spasms. 15 each 3   Coenzyme Q10 (COQ10) 100 MG CAPS Take 100 mg by mouth daily.      diclofenac sodium (VOLTAREN) 1 % GEL Apply 4 g topically 4 (four) times daily as needed (for pain).      furosemide (LASIX) 20 MG tablet Take 1 tablet (20 mg total) by mouth as needed. 30 tablet 0   gabapentin (NEURONTIN) 300 MG capsule Take 1 capsule (300 mg total) by mouth 3 (three) times daily. (Patient taking differently: Take 400 mg by mouth 3 (three) times daily.)     lansoprazole (PREVACID) 30 MG capsule Take 1 capsule (30 mg total) by mouth daily at 12 noon. TAKE (1) CAPSULE BY MOUTH TWICE DAILY. 90 capsule 3   levothyroxine (SYNTHROID) 100 MCG tablet Take 100 mcg by mouth daily.     lidocaine (LIDODERM) 5 % Place 1-3 patches onto the skin daily as needed (for pain). Remove & Discard patch within 12 hours or as directed by MD for pain     mycophenolate (CELLCEPT) 500 MG  tablet Take 1 tablet (500 mg total) by mouth 2 (two) times daily. 180 tablet 3   OVER THE COUNTER MEDICATION Calcium and vit D one bid     oxyCODONE-acetaminophen (PERCOCET) 10-325 MG tablet Take 1 tablet by mouth every 4 (four) hours as needed (pain).     polyethylene glycol powder (GLYCOLAX/MIRALAX) 17 GM/SCOOP powder Take 8.5 g by mouth daily. (Patient taking differently: Take 8.5 g by mouth 2 (two) times daily. 1-2 8.5 mg prn.)     potassium chloride SA (KLOR-CON M) 20 MEQ tablet Take 1 tablet (20 mEq total) by mouth daily. 30 tablet 0   predniSONE (DELTASONE) 1 MG tablet Take 5 tablets (5 mg total) by mouth daily with breakfast. (Patient taking differently: Take 3 mg by mouth daily with breakfast. Takes with one 5 mg tablet to make a total of 8 mg daily) 450 tablet 3   predniSONE (DELTASONE) 5 MG tablet Take 1 tablet daily, will use the 1 mg tablets in addition to taper by 1 mg monthly until down to 5 mg and stay on this dose (Patient taking differently: Take 5 mg by mouth daily at 6 (six) AM. In addition to 3 mg total of  8 mg daily) 90 tablet 1   pyridostigmine (MESTINON) 60 MG tablet Take 0.5 tablets (30 mg total) by mouth 4 (four) times daily. 180 tablet 3   Sod Picosulfate-Mag Ox-Cit Acd (CLENPIQ) 10-3.5-12 MG-GM -GM/175ML SOLN Take 1 kit by mouth as directed. 350 mL 0   Sodium Sulfate-Mag Sulfate-KCl (SUTAB) [phone] MG TABS As directed 24 tablet 0   SUMAtriptan (IMITREX) 50 MG tablet Take 50 mg by mouth every 2 (two) hours as needed. For migraines     Terbinafine 1 % GEL Apply 1 application topically 2 (two) times daily as needed (toe nail fungus).      traZODone (DESYREL) 50 MG tablet TAKE (1) TABLET BY MOUTH AT BEDTIME. 90 tablet 2   No current facility-administered medications for this visit.    PHYSICAL EXAMINATION: ECOG PERFORMANCE STATUS: {CHL ONC ECOG PS:[id number]}  There were no vitals filed for this visit. There were no vitals filed for this visit.  BREAST:*** No  palpable masses or nodules in either right or left breasts. No palpable axillary supraclavicular or infraclavicular adenopathy no breast tenderness or nipple discharge. (exam performed in the presence of a chaperone)  LABORATORY DATA:  I have reviewed the data as listed    Latest Ref Rng & Units 01/12/2022   11:37 AM 12/28/2020   12:03 PM 03/16/2020    3:50 PM  CMP  Glucose 70 - 99 mg/dL 72  90  578   BUN 8 - 27 mg/dL 13  18  16    Creatinine 0.57 - 1.00 mg/dL 4.69  6.29  5.28   Sodium 134 - 144 mmol/L 142  142  138   Potassium 3.5 - 5.2 mmol/L 3.9  4.5  4.3   Chloride 96 - 106 mmol/L 102  103  101   CO2 20 - 29 mmol/L 28  26  25    Calcium 8.7 - 10.3 mg/dL 9.9  41.3  9.7   Total Protein 6.0 - 8.5 g/dL 6.2  6.3  6.2   Total Bilirubin 0.0 - 1.2 mg/dL 0.5  0.4  0.4   Alkaline Phos 44 - 121 IU/L 52  50  57   AST 0 - 40 IU/L 15  22  18    ALT 0 - 32 IU/L 13  16  18      Lab Results  Component Value Date   WBC 6.1 01/12/2022   HGB 12.5 01/12/2022   HCT 38.7 01/12/2022   MCV 101 (H) 01/12/2022   PLT 144 (L) 01/12/2022   NEUTROABS 2.9 01/12/2022    ASSESSMENT & PLAN:  No problem-specific Assessment & Plan notes found for this encounter.    No orders of the defined types were placed in this encounter.  The patient has a good understanding of the overall plan. she agrees with it. she will call with any problems that may develop before the next visit here. Total time spent: 30 mins including face to face time and time spent for planning, charting and co-ordination of care   Sherlyn Lick, CMA 08/10/22    I Janan Ridge am acting as a Neurosurgeon for [employer]  ***

## 2022-08-15 ENCOUNTER — Inpatient Hospital Stay: Payer: Medicare Other | Attending: Hematology and Oncology | Admitting: Hematology and Oncology

## 2022-08-15 VITALS — BP 123/68 | HR 82 | Temp 97.7°F | Resp 18 | Ht 63.0 in | Wt 144.7 lb

## 2022-08-15 DIAGNOSIS — Z79811 Long term (current) use of aromatase inhibitors: Secondary | ICD-10-CM | POA: Insufficient documentation

## 2022-08-15 DIAGNOSIS — Z170 Estrogen receptor positive status [ER+]: Secondary | ICD-10-CM | POA: Insufficient documentation

## 2022-08-15 DIAGNOSIS — Z950 Presence of cardiac pacemaker: Secondary | ICD-10-CM | POA: Insufficient documentation

## 2022-08-15 DIAGNOSIS — C50411 Malignant neoplasm of upper-outer quadrant of right female breast: Secondary | ICD-10-CM | POA: Insufficient documentation

## 2022-08-15 NOTE — Assessment & Plan Note (Signed)
Right lumpectomy: 10/28/2019: Grade 2 IDC 1.4 cm, intermediate grade DCIS, margins negative, suspicious for LVI, ER 95%, PR 0%, HER-2 negative, Ki-67 2% T1c Nx stage Ia   Recommendation: Adjuvant antiestrogen therapy with anastrozole 1 mg daily x5 years Did not want to do radiation.   Anastrozole Toxicities: Denies any adverse effects to anastrozole therapy. Chronic arthritis and rotator cuff tendinitis in the right shoulder   Breast cancer Surveillance: 1. Breast Exam: 08/15/2022: Benign 2. Mammograms: 07/02/2020: Benign breast density category B (she has not had any further mammograms since)   Lower extremity edema: This is after she worked painting her house and stood on her feet all day.  She had an echocardiogram which did not show any clear-cut LV or RV dysfunction.  She did have a dilated left atrium.  I gave her a prescription for Lasix to be used on an as-needed basis along with potassium.   She will call Dr. Ladona Ridgel her cardiologist to discuss this further.   Return to clinic in 1 year for follow-up

## 2022-08-23 ENCOUNTER — Other Ambulatory Visit: Payer: Self-pay | Admitting: Internal Medicine

## 2022-08-23 DIAGNOSIS — I480 Paroxysmal atrial fibrillation: Secondary | ICD-10-CM

## 2022-08-23 NOTE — Telephone Encounter (Signed)
Prescription refill request for Eliquis received. Indication: Afib  Last office visit: 11/22/21 Ladona Ridgel)  Scr: 0.85 (01/12/22)  Age: 82 Weight: 65.5kg  Appropriate dose. Refill sent.

## 2022-08-29 NOTE — Progress Notes (Signed)
Remote pacemaker transmission.   

## 2022-08-30 ENCOUNTER — Telehealth: Payer: Self-pay | Admitting: Sports Medicine

## 2022-08-30 NOTE — Telephone Encounter (Signed)
Patient Daughter [name] (DOB [birth year]) called in stating her mother is having back pains Sciatic nerve and she would like her to have back injection like she gets the pain starts on the right side of the back goes down into the hip and into her leg. Was wondering if she could see Shon Baton for this also or if she need someone else like [place]? Please call her at [phone]

## 2022-08-31 NOTE — Telephone Encounter (Signed)
Spoke with the daughter; she would like for her mom to see Dr. Shon Baton. She is scheduled for Friday 21st at 3:30

## 2022-09-06 DIAGNOSIS — E039 Hypothyroidism, unspecified: Secondary | ICD-10-CM | POA: Diagnosis not present

## 2022-09-06 DIAGNOSIS — I4891 Unspecified atrial fibrillation: Secondary | ICD-10-CM | POA: Diagnosis not present

## 2022-09-06 DIAGNOSIS — D509 Iron deficiency anemia, unspecified: Secondary | ICD-10-CM | POA: Diagnosis not present

## 2022-09-06 DIAGNOSIS — E559 Vitamin D deficiency, unspecified: Secondary | ICD-10-CM | POA: Diagnosis not present

## 2022-09-06 DIAGNOSIS — R5383 Other fatigue: Secondary | ICD-10-CM | POA: Diagnosis not present

## 2022-09-06 DIAGNOSIS — F5105 Insomnia due to other mental disorder: Secondary | ICD-10-CM | POA: Diagnosis not present

## 2022-09-06 DIAGNOSIS — W19XXXA Unspecified fall, initial encounter: Secondary | ICD-10-CM | POA: Diagnosis not present

## 2022-09-06 DIAGNOSIS — E538 Deficiency of other specified B group vitamins: Secondary | ICD-10-CM | POA: Diagnosis not present

## 2022-09-06 DIAGNOSIS — G7000 Myasthenia gravis without (acute) exacerbation: Secondary | ICD-10-CM | POA: Diagnosis not present

## 2022-09-08 ENCOUNTER — Other Ambulatory Visit: Payer: Self-pay

## 2022-09-08 ENCOUNTER — Other Ambulatory Visit (INDEPENDENT_AMBULATORY_CARE_PROVIDER_SITE_OTHER): Payer: Medicare Other

## 2022-09-08 ENCOUNTER — Ambulatory Visit: Payer: Medicare Other | Admitting: Sports Medicine

## 2022-09-08 ENCOUNTER — Encounter: Payer: Self-pay | Admitting: Sports Medicine

## 2022-09-08 DIAGNOSIS — M41126 Adolescent idiopathic scoliosis, lumbar region: Secondary | ICD-10-CM

## 2022-09-08 DIAGNOSIS — M19011 Primary osteoarthritis, right shoulder: Secondary | ICD-10-CM | POA: Diagnosis not present

## 2022-09-08 DIAGNOSIS — G8929 Other chronic pain: Secondary | ICD-10-CM

## 2022-09-08 DIAGNOSIS — M5136 Other intervertebral disc degeneration, lumbar region: Secondary | ICD-10-CM

## 2022-09-08 DIAGNOSIS — M5441 Lumbago with sciatica, right side: Secondary | ICD-10-CM

## 2022-09-08 DIAGNOSIS — M25511 Pain in right shoulder: Secondary | ICD-10-CM

## 2022-09-08 NOTE — Progress Notes (Signed)
TAIJAH Nolan - 82 y.o. female MRN [id number]  Date of birth: December 26, 1940  Office Visit Note: Visit Date: 09/08/2022 PCP: Roe Rutherford, NP Referred by: Roe Rutherford, NP  Subjective: Chief Complaint  Patient presents with   Lower Back - Pain   HPI: [name] is a pleasant 82 y.o. female who presents today for acute on chronic low back pain with right radiculopathy of LE.  She presents with her daughter [name] who does help provide some of the HPI.  [name] has a chronic history of degenerative disc disease as well as scoliosis that she has had for years per her daughter [name].  [name] tells me today she has had pain of the low back for at least a year, however her pain has started to worsen.  She is now experiencing a sharp radiating pain that includes burning and stinging sensation down the right leg down the lateral thigh anterior knee and shin.  She has tried lidocaine patches over the leg without relief of her pain.  She does follow with pain management and is managed on gabapentin 300 mg 3 times daily, Percocet 10-325 mg every 4-6 hours as needed, as well as Cymbalta.   She does have a notable history of myasthenia gravis.  Right shoulder osteoarthritis -did perform an ultrasound-guided glenohumeral joint injection on 08/07/2022 in this essentially resolved her pain.  Unfortunately she had an incident grabbing a Christmas tree and falling over that side that aggravated her right shoulder.  Inquiring about repeat injection.  Pertinent ROS were reviewed with the patient and found to be negative unless otherwise specified above in HPI.   Assessment & Plan: Visit Diagnoses:  1. Chronic right-sided low back pain with right-sided sciatica   2. DDD (degenerative disc disease), lumbar   3. Adolescent idiopathic scoliosis of lumbar region   4. Primary osteoarthritis, right shoulder   5. Chronic right shoulder pain    Plan: Discussed with [name] today the nature of her chronic  right-sided low back and SI joint pain.  She does have pain over the SI joint, but I believe her radicular symptoms are emanating from the lumbar spine, her radicular pattern does fit more than L5 > L4 dermatome on the right.  Both [name] and her daughter thought she may benefit from an SI joint injection, we did perform this today under ultrasound guidance.  I discussed this would likely improve her pain at that location but I have a low suspicion this will help her radicular pain.  She will update me in about 7 to 10 days to see how much this improves her pain and if her right radicular leg pain improves as well.  In the meantime, I discussed getting her to see our spine physician, [name], more for guidance in terms of what location/level he feels a spine injection may benefit her.  Surgical options are not really on the table at this point.  Appreciate his recs and further treatment Rex per his discretion for the low back.  Immediately greatly aggravated her right shoulder pain, will need to wait at least 3 weeks but then she may return if she wishes to proceed with an injection into that shoulder.  She will continue gabapentin 300 mg 3 times daily, her Cymbalta as well as Percocet 10-325 mg every 4-6 hours as needed for pain control.  Follow-up: Return for f/u in 1-2 weeks with [name] for lumbar spine evaluation; US-guided shoulder inj w/[name] in 3 wk.  Meds & Orders: No orders of the defined types were placed in this encounter.   Orders Placed This Encounter  Procedures   XR Lumbar Spine Complete   US Guided Needle Placement - No Linked Charges     Procedures: U/S-guided SI-joint injection, Right   After discussion of risk/benefits/indications, informed verbal consent was obtained. A timeout was then performed. The patient was positioned in a prone position on exam room table with a pillow placed under the pelvis for mild hip flexion. The SI joint area was cleaned and prepped with  betadine and alcohol swabs. Sterile ultrasound gel was applied and the ultrasound transducer was placed in an anatomic axial plane over the PSIS, then moved distally over the SI-joint. Using ultrasound guidance, a 22-gauge, 3.5" needle was inserted from a medial to lateral approach utilizing an in-plane approach and directed into the SI-joint. The SI-joint was then injected with a mixture of 4:1 lidocaine:depomedrol with visualization of the injectate flow into the SI-joint under ultrasound visualization. The patient tolerated the procedure well without immediate complications.       Clinical History: EXAM: MRI LUMBAR SPINE WITHOUT AND WITH CONTRAST   TECHNIQUE: Multiplanar and multiecho pulse sequences of the lumbar spine were obtained without and with intravenous contrast.   CONTRAST:  8mL GADAVIST GADOBUTROL 1 MMOL/ML IV SOLN   COMPARISON:  Radiographs 12/04/2019   FINDINGS: Segmentation: There are five lumbar type vertebral bodies. The last full intervertebral disc space is labeled L5-S1. This correlates with the prior lumbar radiographs.   Alignment: Significant left convex rotatory scoliotic deformity of the lumbar spine with associated advanced multilevel degenerative disc disease and facet disease.   Vertebrae:  No bone lesions or fractures.   Conus medullaris and cauda equina: Conus extends to the L1 level. Conus and cauda equina appear normal.   Paraspinal and other soft tissues: No significant paraspinal or retroperitoneal findings.   Disc levels:   L1-2: Advanced degenerative disc disease and facet disease but no disc protrusion or significant spinal stenosis. There is mild bilateral lateral recess stenosis and mild bilateral foraminal encroachment, left greater than right.   L2-3: Advanced degenerative disc disease and facet disease. There is a bulging uncovered disc and a central disc osteophyte complex. This in combination with significant right-sided facet  disease contributes to significant right lateral recess stenosis likely effecting the right L3 nerve root. Mild right foraminal encroachment also. The left neural foramen is widely patent.   L3-4: Mild bulging annulus and moderate facet disease with ligamentum flavum thickening contributing to moderate bilateral lateral recess stenosis and mild bilateral foraminal encroachment. There is also mild/early spinal stenosis.   L4-5: Diffuse bulging annulus, osteophytic ridging, short pedicles, severe facet disease and ligamentum flavum thickening contributing to moderately severe spinal and bilateral lateral recess stenosis. No significant foraminal stenosis. Mild foraminal encroachment noted on the left.   L5-S1: Advanced facet disease, left greater than right but no disc protrusion, spinal or foraminal stenosis.   IMPRESSION: 1. Advanced degenerative lumbar spondylosis with multilevel disc disease and facet disease. 2. Moderately severe multifactorial spinal and bilateral lateral recess stenosis at L4-5. 3. Mild spinal stenosis and moderate bilateral lateral recess stenosis at L3-4. 4. Significant right lateral recess stenosis at L2-3 likely affecting the right L3 nerve root. 5. Mild bilateral lateral recess stenosis and mild bilateral foraminal encroachment at L1-2, left greater than right.     Electronically Signed   By: Rudie Meyer M.D.   On: 12/22/2019 15:14  She reports that  she has never smoked. She has never been exposed to tobacco smoke. She has never used smokeless tobacco. No results for input(s): "HGBA1C", "LABURIC" in the last 8760 hours.  Objective:   Vital Signs: There were no vitals taken for this visit.  Physical Exam  Gen: Well-appearing, in no acute distress; non-toxic CV: Well-perfused. Warm.  Resp: Breathing unlabored on room air; no wheezing. Psych: Fluid speech in conversation; appropriate affect; normal thought process  Ortho Exam - Lumbar spine/SI:  There is a significant thoracolumbar scoliotic curve with levoscoliosis of the lumbar spine.  There is no midline spinous process TTP.  Positive TTP and Fortin's point test of the right SI joint.  The patient is unable to lay supine, but does have a positive modified slump test on the right.  She does ambulate with the assistance of a walker.  There is notable thoracic kyphosis.  - Right shoulder: No redness or swelling.  Fairly equivocal range of motion, there is some grating through end range.  Imaging: XR Lumbar Spine Complete  Result Date: 09/08/2022 Complete x-rays of the lumbar spine including AP, lateral and flexion/extension views were ordered and reviewed by myself.  X-rays demonstrate a rather significant levoscoliosis of the lumbar spine.  There is advanced degenerative disc disease throughout the entirety of the lumbar spine worse through the L4-S1 level.  There is multilevel facet moderate to severe arthropathy.    Past Medical/Family/Surgical/Social History: Medications & Allergies reviewed per EMR, new medications updated. Patient Active Problem List   Diagnosis Date Noted   Primary osteoarthritis, right shoulder 08/07/2022   Sorethroat 07/26/2021   Myasthenia gravis (HCC) 07/12/2021   Gait abnormality 07/12/2021   Insomnia due to other mental disorder 07/01/2021   Lumbar radiculopathy, chronic 07/01/2021   History of breast cancer 05/30/2021   Status post partial mastectomy of right breast 05/30/2021   Abnormal defecation 01/25/2021   Chronic pain syndrome 10/12/2020   Immunodeficiency due to drugs (HCC) 10/12/2020   Varicose veins of both lower extremities 10/12/2020   Infiltrating ductal carcinoma of right breast (HCC) 06/28/2020   Malignant neoplasm of upper-outer quadrant of right breast in female, estrogen receptor positive (HCC) 10/22/2019   Atypical chest pain 09/23/2018   Dilated bile duct 09/23/2018   Pain in left knee 11/13/2017   Sinus node dysfunction (HCC)  07/18/2017   Dyspnea on exertion 06/25/2017   Stage 3 chronic kidney disease (HCC) 01/16/2017   Small vessel disease, cerebrovascular 07/11/2016   Other fatigue 01/25/2016   Risk for falls 01/25/2016   Paraganglioma, malignant (HCC) 11/06/2015   Nodule of right lung 09/13/2015   Monoallelic mutation of SDHC gene 08/20/2015   Hypertensive cardiovascular disease 05/25/2014   Malnutrition of moderate degree (HCC) 04/28/2014   Thrombocytopenia (HCC) 04/28/2014   UTI (urinary tract infection) 04/28/2014   Physical deconditioning 04/27/2014   Ocular myasthenia (HCC) 04/27/2014   Depression with anxiety 04/27/2014   Chronic diastolic CHF (congestive heart failure) (HCC) 04/27/2014   Bradycardia    Orthostatic hypotension    Elevated MCV 04/20/2014   Family history of vitamin B12 deficiency 04/20/2014   H/O osteoporosis 04/20/2014   Vitamin D deficiency 04/20/2014   AKI (acute kidney injury) (HCC) 04/05/2014   Palpitations 04/04/2014   Paroxysmal atrial fibrillation (HCC) 04/04/2014   Chest pain 04/01/2014   Sinus bradycardia 04/01/2014   Loss of weight 06/10/2012   Abdominal pain, bilateral lower quadrant 09/26/2011   Hypertension 07/26/2011   Hypothyroidism 07/26/2011   Arthritis 07/26/2011   High cholesterol 07/26/2011  GERD (gastroesophageal reflux disease) 07/26/2011   Rectal bleeding 07/26/2011   IBS (irritable bowel syndrome) 07/26/2011   Past Medical History:  Diagnosis Date   Anemia    Atrial fibrillation (HCC)    Breast cancer (HCC) 09/2019   right breast    Chronic back pain    Chronic nausea    Complication of anesthesia    hypotension, elevated heart rate and oxygenation desaturation   Depression with anxiety    Diverticulosis    Dyspnea    due to myasthenia gravis   Essential hypertension    GERD (gastroesophageal reflux disease)    History of pneumonia    Hypothyroidism    IBS (irritable colon syndrome)    LVH (left ventricular hypertrophy)     Migraines    Mixed hyperlipidemia    Obesity    Ocular myasthenia gravis (HCC)    Osteoarthritis of knee    Pacemaker    PAF (paroxysmal atrial fibrillation) (HCC)    chads2vasc score is at least 4   Paraganglioma (HCC)    Resection 02/2012 (retroperitoneal)   Paraganglioma, malignant (HCC) 11/06/2015   Presence of permanent cardiac pacemaker 07/18/2017   Renal insufficiency    Skin cancer    Family History  Problem Relation Age of Onset   Inflammatory bowel disease Maternal Grandmother    Sleep apnea Daughter    Sleep apnea Daughter    Sleep apnea Son    Past Surgical History:  Procedure Laterality Date   ABDOMINAL HYSTERECTOMY     BREAST BIOPSY Right 09/2019   BREAST LUMPECTOMY Right 10/28/2019   CARDIAC CATHETERIZATION     CHOLECYSTECTOMY     COLONOSCOPY  08/24/2011   Procedure: COLONOSCOPY;  Surgeon: Malissa Hippo, MD;  Location: [facility];  Service: Endoscopy;  Laterality: N/A;  1200   CT guided biopsy  01/16/12   GIVENS CAPSULE STUDY  01/29/2012   Procedure: GIVENS CAPSULE STUDY;  Surgeon: Malissa Hippo, MD;  Location: [facility];  Service: Endoscopy;  Laterality: N/A;  730   KNEE SURGERY     LAPAROTOMY  03/01/2012   Procedure: EXPLORATORY LAPAROTOMY;  Surgeon: Velora Heckler, MD;  Location: [facility];  Service: General;  Laterality: N/A;  Exploratory Laparotomy ,Resection Retroperitoneal Mass   MASTECTOMY, PARTIAL Right 10/28/2019   Procedure: RIGHT BREAST PARTIAL MASTECTOMY;  Surgeon: Darnell Level, MD;  Location:  SURGERY CENTER;  Service: General;  Laterality: Right;   NASAL SINUS SURGERY     30 yrs ago   PACEMAKER IMPLANT N/A 07/18/2017   Procedure: PACEMAKER IMPLANT;  Surgeon: Marinus Maw, MD;  Location: [facility];  Service: Cardiovascular;  Laterality: N/A;   RIGHT/LEFT HEART CATH AND CORONARY ANGIOGRAPHY N/A 11/18/2018   Procedure: RIGHT/LEFT HEART CATH AND CORONARY ANGIOGRAPHY;  Surgeon: Lyn Records, MD;  Location: [facility];  Service: Cardiovascular;  Laterality: N/A;   TOTAL KNEE ARTHROPLASTY Bilateral    2005 and 2011   Social History   Occupational History   Occupation: Retired Administrator, arts  Tobacco Use   Smoking status: Never    Passive exposure: Never   Smokeless tobacco: Never  Vaping Use   Vaping Use: Never used  Substance and Sexual Activity   Alcohol use: No    Alcohol/week: 0.0 standard drinks of alcohol   Drug use: No   Sexual activity: Not on file

## 2022-09-11 ENCOUNTER — Telehealth (INDEPENDENT_AMBULATORY_CARE_PROVIDER_SITE_OTHER): Payer: Self-pay | Admitting: Gastroenterology

## 2022-09-11 NOTE — Telephone Encounter (Signed)
Pt daughter [name] called in wanting to reschedule EGD/TCS. Pt originally scheduled for January 2024 but came down with pneumonia and had to cancel. Pt last seen in office 01/30/22. Does pt need to be seen in office or is it OK to schedule pt? Please advise. Thank you!

## 2022-09-11 NOTE — Telephone Encounter (Signed)
She can be scheduled in room 3. Will need a 2-day prep and clearance to stop Eliquis. Thanks

## 2022-09-20 ENCOUNTER — Ambulatory Visit: Payer: Medicare Other | Admitting: Neurology

## 2022-09-20 ENCOUNTER — Encounter: Payer: Self-pay | Admitting: Neurology

## 2022-09-20 VITALS — BP 122/74 | HR 97 | Ht 64.0 in | Wt 140.0 lb

## 2022-09-20 DIAGNOSIS — G7000 Myasthenia gravis without (acute) exacerbation: Secondary | ICD-10-CM

## 2022-09-20 DIAGNOSIS — G4733 Obstructive sleep apnea (adult) (pediatric): Secondary | ICD-10-CM

## 2022-09-20 MED ORDER — PYRIDOSTIGMINE BROMIDE 60 MG PO TABS: 30.0000 mg | ORAL_TABLET | Freq: Four times a day (QID) | ORAL | 3 refills | Status: DC

## 2022-09-20 MED ORDER — MYCOPHENOLATE MOFETIL 500 MG PO TABS: 500.0000 mg | ORAL_TABLET | Freq: Two times a day (BID) | ORAL | 3 refills | Status: DC

## 2022-09-20 MED ORDER — PREDNISONE 5 MG PO TABS: ORAL_TABLET | ORAL | 1 refills | Status: DC

## 2022-09-20 NOTE — Progress Notes (Signed)
Patient: [name] Date of Birth: 17-Dec-1940  Reason for Visit: Follow up History from: Patient, daughter Primary Neurologist: Dr. Wyvonne Lenz. [place]-OSA  ASSESSMENT AND PLAN 82 y.o. year old female   1.  Myasthenia gravis, ocular predominant (Dr. Anne Hahn started prednisone in March 2021 for generalized fatigue, shortness of breath with exertion).  Underlying factors of aging, chronic pain, arthritis, orthopedic conditions. -Has remained overall stable on low-dose prednisone 5 mg daily, CellCept 500 mg twice daily, Mestinon 60 mg 1/2 tablet 4 times daily -Reluctant for any further medication adjustments, but will try to Mestinon 3 times daily versus 4 to see if any change in symptoms, has been on this regimen for quite a while -Reviewed recent labs CBC, CMP from PCP that were unremarkable -Follow-up in 6 months with Dr. Terrace Arabia for reevaluation of MG  2.  OSA on CPAP -Lately suboptimal compliance, but within the last week got a new mask that is more tolerable less leak, I will pull a download in 1 month to reevaluate AHI, mask leak -We discussed considering a new sleep study but wants to hold off for now, has lost 30 lbs since evaluation in March 2023 (showed moderate to severe OSA with total AHI 28.5/hour, REM AHI 40/hour) -She had titration study x 2; with the 2nd study didn't sleep well had trouble with sleep consolidation   HISTORY THEDORA PANCOAST, accompanied by her daughter, follow-up for myasthenia gravis, she was a patient of Dr. Anne Hahn.   I reviewed and summarized the referring note. PMHX. A fib, Eliquis Pacemaker HTN HLD Hypothyroidism Chronic low back, neck pain, taking Percocet 10/325 x 6 tabs a week by PCP Paraganglioma, retroperitoneal s/p resection in 2013, Right breast cancer, partial lobectomy.   Patient had severe arthritis, complains of multiple joints pain, significant chronic neck and low back pain, is getting scheduled narcotics through her primary care  physician   She was diagnosed of myasthenia gravis more than 20 years ago, in 1990s she presented with double vision, myasthenia gravis symptoms are mainly persistent binocular diplopia, she never had significant swallowing difficulties/breathing difficulty, she has arm and lower extremity pain she contributed to her severe arthritis, musculoskeletal pain  Dr. Anne Hahn took over her management of myasthenia gravis since February 2019 after her previous neurology retired, over the years, she has been treated with stable dose of CellCept 500 mg twice a day, on relatively low-dose of prednisone 10 mg for a long time, last visit in October 2022, she began to tapering down prednisone dose at a very slow pace, she worried about the long-term side effect of prednisone, and was not sure that it has helped her symptoms, she is a retired Designer, jewellery   She has severe bilateral cataract, status post surgery and lens implant of left eye, left eye vision is 20/20, she purposely delayed her right cataract surgery because the visual acuity difference make double vision less obvious, previously she has to wear eye patch to suppress 1 vision loss  Update January 12, 2022 SS: tried to taper down prednisone to 6 mg, felt trouble walking, balance, went back up to 9 mg. Still trouble with balance, using cane, still works in her flowers, lives alone. Sleeps in chair. Needs to have shoulder replacement to the right, hard to push with right. No falls in 3 years. On Cellcept 500 mg BID, takes Mestinon 60 mg 1/2 4 times daily tolerates well. Has constipation. On gabapentin 300 mg TID comes from PCP, oxycodone.   Remains on  CPAP, notes leaking from mask, she is very still with sleep, uses full face. Mentions constantly active, can't tell huge difference.  Review of CPAP data 12/13/2021-01/11/2022 shows 29/30 days at 97% usage, greater than 4 hours 22 days 73%.  Average usage days used 6 hours and 15 minutes.  Minimum pressure 7  maximum pressure 14 cm water.  Pressure in the 95th percentile 12, maximum 13.1.  Leak in the 95th percentile 56.6, AHI 23.7 (0.1 central, 8.6 obstructive, 14.2 unknown).  Update September 20, 2022 SS: Has been working to find a mask with good fit, using mask under the nose and over the mouth. Not leaking. Is a good fit. Mentions with CPAP no change vs without it. Always feels weak and tired from other health issues. Stays up late reading, sleeps 12 hours. Daughter thinks complains more about being weak when not using CPAP. Daughter is now living with her since she had PNA. Has lost 30 lbs since sleep evaluation in March 2023.   90 day CPAP data 06/22/22-09/19/22 usage 77/90 days at 86%.  Greater than 4 hours 42 days at 47%. 7-14 cm water.  Leak 84.6, AHI 28.5. 08/19/22-09/17/22 18/30 days at 60%, greater than 4 hours 14 days at 47%. 7-14 cm water.  Leak 53.3, AHI 24.4.  ESS 2.  Has reduced the prednisone down to 5 mg daily. Cannot tell a difference on lower dose, was on 9 mg. Works in yard, works in the garden. 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 Feels MG stable. Very busy yesterday, today feels wiped out. Loses her balance, fall on porch reaching for light string. On oxycodone for chronic pain, arthritis, needs bilateral should replacement, Eliquis has been lowered due to bruising. Takes Mestinon 30 mg 4 times daily without any reported symptoms of MG. It makes her nose run.   REVIEW OF SYSTEMS: Out of a complete 14 system review of symptoms, the patient complains only of the following symptoms, and all other reviewed systems are negative.  See HPI  ALLERGIES: Allergies  Allergen Reactions   Nitrofurantoin Macrocrystal Nausea And Vomiting   Ampicillin Rash    Did it involve swelling of the face/tongue/throat, SOB, or low BP? No Did it involve sudden or severe rash/hives, skin peeling, or any reaction on the inside of your mouth or nose? No Did you need to seek medical  attention at a hospital or doctor's office? No When did it last happen? 20 years ago     If all above answers are "NO", may proceed with cephalosporin use.    Avocado Diarrhea and Nausea And Vomiting   Biaxin [Clarithromycin] Rash   Doxycycline Monohydrate Nausea And Vomiting   Moxifloxacin     Contraindication myasthenia gravis   Sensi-Care Protective Barrier [Petrolatum-Zinc Oxide] Rash   Tetracyclines & Related Nausea And Vomiting    HOME MEDICATIONS: Outpatient Medications Prior to Visit  Medication Sig Dispense Refill   ALPRAZolam (XANAX) 0.5 MG tablet Take 1 mg by mouth 2 (two) times daily. Takes as needed.     anastrozole (ARIMIDEX) 1 MG tablet TAKE (1) TABLET BY MOUTH ONCE DAILY. 90 tablet 0   apixaban (ELIQUIS) 2.5 MG TABS tablet Take 2.5 mg by mouth 2 (two) times daily.     Artificial Tear Solution (SOOTHE XP OP) Place 1 drop into both eyes 4 (four) times daily.     atenolol (TENORMIN) 25 MG tablet TAKE (1) TABLET BY MOUTH ONCE DAILY. MAY TAKE AN ADDITIONAL  TABLET IF SYSTOLIC OVER 150 OR SEVERE PALPITATIONS. 100 tablet 0   atorvastatin (LIPITOR) 20 MG tablet Take 20 mg by mouth daily.     B Complex-C (B-COMPLEX WITH VITAMIN C) tablet Take 1 tablet by mouth daily with supper.      baclofen (LIORESAL) 10 MG tablet Take 0.5 tablets (5 mg total) by mouth at bedtime as needed for muscle spasms. 15 each 3   Coenzyme Q10 (COQ10) 100 MG CAPS Take 100 mg by mouth daily.      diclofenac sodium (VOLTAREN) 1 % GEL Apply 4 g topically 4 (four) times daily as needed (for pain).      furosemide (LASIX) 20 MG tablet Take 1 tablet (20 mg total) by mouth as needed. 30 tablet 0   gabapentin (NEURONTIN) 300 MG capsule Take 1 capsule (300 mg total) by mouth 3 (three) times daily. (Patient taking differently: Take 400 mg by mouth 3 (three) times daily.)     lansoprazole (PREVACID) 30 MG capsule Take 1 capsule (30 mg total) by mouth daily at 12 noon. TAKE (1) CAPSULE BY MOUTH TWICE DAILY. 90 capsule 3    levothyroxine (SYNTHROID) 100 MCG tablet Take 100 mcg by mouth daily.     lidocaine (LIDODERM) 5 % Place 1-3 patches onto the skin daily as needed (for pain). Remove & Discard patch within 12 hours or as directed by MD for pain     OVER THE COUNTER MEDICATION Calcium and vit D one bid     oxyCODONE-acetaminophen (PERCOCET) 10-325 MG tablet Take 1 tablet by mouth every 4 (four) hours as needed (pain).     polyethylene glycol powder (GLYCOLAX/MIRALAX) 17 GM/SCOOP powder Take 8.5 g by mouth daily. (Patient taking differently: Take 8.5 g by mouth 2 (two) times daily. 1-2 8.5 mg prn.)     potassium chloride SA (KLOR-CON M) 20 MEQ tablet Take 1 tablet (20 mEq total) by mouth daily. 30 tablet 0   Sod Picosulfate-Mag Ox-Cit Acd (CLENPIQ) 10-3.5-12 MG-GM -GM/175ML SOLN Take 1 kit by mouth as directed. 350 mL 0   Sodium Sulfate-Mag Sulfate-KCl (SUTAB) [id number] MG TABS As directed 24 tablet 0   SUMAtriptan (IMITREX) 50 MG tablet Take 50 mg by mouth every 2 (two) hours as needed. For migraines     Terbinafine 1 % GEL Apply 1 application topically 2 (two) times daily as needed (toe nail fungus).      traZODone (DESYREL) 50 MG tablet TAKE (1) TABLET BY MOUTH AT BEDTIME. 90 tablet 2   mycophenolate (CELLCEPT) 500 MG tablet Take 1 tablet (500 mg total) by mouth 2 (two) times daily. 180 tablet 3   predniSONE (DELTASONE) 5 MG tablet Take 1 tablet daily, will use the 1 mg tablets in addition to taper by 1 mg monthly until down to 5 mg and stay on this dose (Patient taking differently: Take 5 mg by mouth daily at 6 (six) AM. In addition to 3 mg total of 8 mg daily) 90 tablet 1   pyridostigmine (MESTINON) 60 MG tablet Take 0.5 tablets (30 mg total) by mouth 4 (four) times daily. 180 tablet 3   apixaban (ELIQUIS) 5 MG TABS tablet TAKE (1) TABLET BY MOUTH TWICE DAILY. 60 tablet 5   atorvastatin (LIPITOR) 40 MG tablet Take 40 mg by mouth daily.     predniSONE (DELTASONE) 1 MG tablet Take 5 tablets (5 mg total) by  mouth daily with breakfast. (Patient taking differently: Take 3 mg by mouth daily with breakfast. Takes with one 5  mg tablet to make a total of 8 mg daily) 450 tablet 3   No facility-administered medications prior to visit.    PAST MEDICAL HISTORY: Past Medical History:  Diagnosis Date   Anemia    Atrial fibrillation (HCC)    Breast cancer (HCC) 09/2019   right breast    Chronic back pain    Chronic nausea    Complication of anesthesia    hypotension, elevated heart rate and oxygenation desaturation   Depression with anxiety    Diverticulosis    Dyspnea    due to myasthenia gravis   Essential hypertension    GERD (gastroesophageal reflux disease)    History of pneumonia    Hypothyroidism    IBS (irritable colon syndrome)    LVH (left ventricular hypertrophy)    Migraines    Mixed hyperlipidemia    Obesity    Ocular myasthenia gravis (HCC)    Osteoarthritis of knee    Pacemaker    PAF (paroxysmal atrial fibrillation) (HCC)    chads2vasc score is at least 4   Paraganglioma (HCC)    Resection 02/2012 (retroperitoneal)   Paraganglioma, malignant (HCC) 11/06/2015   Presence of permanent cardiac pacemaker 07/18/2017   Renal insufficiency    Skin cancer     PAST SURGICAL HISTORY: Past Surgical History:  Procedure Laterality Date   ABDOMINAL HYSTERECTOMY     BREAST BIOPSY Right 09/2019   BREAST LUMPECTOMY Right 10/28/2019   CARDIAC CATHETERIZATION     CHOLECYSTECTOMY     COLONOSCOPY  08/24/2011   Procedure: COLONOSCOPY;  Surgeon: Malissa Hippo, MD;  Location: [facility];  Service: Endoscopy;  Laterality: N/A;  1200   CT guided biopsy  01/16/12   GIVENS CAPSULE STUDY  01/29/2012   Procedure: GIVENS CAPSULE STUDY;  Surgeon: Malissa Hippo, MD;  Location: [facility];  Service: Endoscopy;  Laterality: N/A;  730   KNEE SURGERY     LAPAROTOMY  03/01/2012   Procedure: EXPLORATORY LAPAROTOMY;  Surgeon: Velora Heckler, MD;  Location: [facility];  Service: General;   Laterality: N/A;  Exploratory Laparotomy ,Resection Retroperitoneal Mass   MASTECTOMY, PARTIAL Right 10/28/2019   Procedure: RIGHT BREAST PARTIAL MASTECTOMY;  Surgeon: Darnell Level, MD;  Location: [facility];  Service: General;  Laterality: Right;   NASAL SINUS SURGERY     30 yrs ago   PACEMAKER IMPLANT N/A 07/18/2017   Procedure: PACEMAKER IMPLANT;  Surgeon: Marinus Maw, MD;  Location: [facility];  Service: Cardiovascular;  Laterality: N/A;   RIGHT/LEFT HEART CATH AND CORONARY ANGIOGRAPHY N/A 11/18/2018   Procedure: RIGHT/LEFT HEART CATH AND CORONARY ANGIOGRAPHY;  Surgeon: Lyn Records, MD;  Location: [facility];  Service: Cardiovascular;  Laterality: N/A;   TOTAL KNEE ARTHROPLASTY Bilateral    2005 and 2011    FAMILY HISTORY: Family History  Problem Relation Age of Onset   Inflammatory bowel disease Maternal Grandmother    Sleep apnea Daughter    Sleep apnea Daughter    Sleep apnea Son     SOCIAL HISTORY: Social History   Socioeconomic History   Marital status: Widowed    Spouse name: Not on file   Number of children: Not on file   Years of education: Not on file   Highest education level: Not on file  Occupational History   Occupation: Retired CCU nurse  Tobacco Use   Smoking status: Never    Passive exposure: Never   Smokeless tobacco: Never  Vaping Use   Vaping Use: Never used  Substance and Sexual Activity   Alcohol use: No    Alcohol/week: 0.0 standard drinks of alcohol   Drug use: No   Sexual activity: Not on file  Other Topics Concern   Not on file  Social History Narrative   Lives in [place] alone.  Retired Administrator, arts.   Caffeine use: few ounces of coke about a every day, hot tea    Right handed   Social Determinants of Health   Financial Resource Strain: Not on file  Food Insecurity: Not on file  Transportation Needs: Not on file  Physical Activity: Not on file  Stress: Not on file  Social Connections: Not on  file  Intimate Partner Violence: Not on file    PHYSICAL EXAM  Vitals:   09/20/22 1427  BP: 122/74  Pulse: 97  Weight: 140 lb (63.5 kg)  Height: 5\' 4"  (1.626 m)   Body mass index is 24.03 kg/m.  Generalized: Well developed, in no acute distress,  Neurological examination  Mentation: Alert oriented to time, place, history taking. Follows all commands speech and language fluent Cranial nerve II-XII: Pupils were equal round reactive to light. Extraocular movements were full, visual field were full on confrontational test, poor vision to the right eye. Facial sensation and strength were normal. Head turning and shoulder shrug were normal and symmetric.  No eye open or cheek puff weakness was noted.  No ptosis. Motor: Good strength overall with generalized weakness seems appropriate for age, poor mobility of the right upper extremity due to shoulder Sensory: Sensory testing is intact to soft touch on all 4 extremities. No evidence of extinction is noted.  Coordination: Cerebellar testing reveals good finger-nose-finger and heel-to-shin bilaterally.  Gait and station: Gait is wide-based, cautious mildly antalgic, slower than normal, has a cane  Reflexes: Deep tendon reflexes are symmetric but decreased  DIAGNOSTIC DATA (LABS, IMAGING, TESTING) - I reviewed patient records, labs, notes, testing and imaging myself where available.  Lab Results  Component Value Date   WBC 6.1 01/12/2022   HGB 12.5 01/12/2022   HCT 38.7 01/12/2022   MCV 101 (H) 01/12/2022   PLT 144 (L) 01/12/2022      Component Value Date/Time   NA 142 01/12/2022 1137   K 3.9 01/12/2022 1137   CL 102 01/12/2022 1137   CO2 28 01/12/2022 1137   GLUCOSE 72 01/12/2022 1137   GLUCOSE 97 11/12/2018 1505   BUN 13 01/12/2022 1137   CREATININE 0.85 01/12/2022 1137   CREATININE 1.05 (H) 05/04/2015 1559   CALCIUM 9.9 01/12/2022 1137   PROT 6.2 01/12/2022 1137   ALBUMIN 4.2 01/12/2022 1137   AST 15 01/12/2022 1137   ALT  13 01/12/2022 1137   ALKPHOS 52 01/12/2022 1137   BILITOT 0.5 01/12/2022 1137   GFRNONAA 48 (L) 03/16/2020 1550   GFRAA 56 (L) 03/16/2020 1550   No results found for: "CHOL", "HDL", "LDLCALC", "LDLDIRECT", "TRIG", "CHOLHDL" Lab Results  Component Value Date   HGBA1C 5.3 01/27/2017   Lab Results  Component Value Date   VITAMINB12 960 03/10/2019   Lab Results  Component Value Date   TSH 1.760 09/19/2017    Margie Ege, AGNP-C, DNP 09/20/2022, 3:12 PM [facility] [address] [place], [place] [address] [phone]

## 2022-09-20 NOTE — Patient Instructions (Addendum)
Try to reduce the Mestinon 30 mg 3 times daily, if worsening symptoms go back to 4 times daily. Continue CPAP use, I will call in 1 month  Meds ordered this encounter  Medications   pyridostigmine (MESTINON) 60 MG tablet    Sig: Take 0.5 tablets (30 mg total) by mouth 4 (four) times daily.    Dispense:  180 tablet    Refill:  3   predniSONE (DELTASONE) 5 MG tablet    Sig: Take 1 tablet daily, will use the 1 mg tablets in addition to taper by 1 mg monthly until down to 5 mg and stay on this dose    Dispense:  90 tablet    Refill:  1   mycophenolate (CELLCEPT) 500 MG tablet    Sig: Take 1 tablet (500 mg total) by mouth 2 (two) times daily.    Dispense:  180 tablet    Refill:  3

## 2022-09-29 ENCOUNTER — Encounter: Payer: Self-pay | Admitting: Sports Medicine

## 2022-09-29 ENCOUNTER — Ambulatory Visit: Payer: Medicare Other | Admitting: Sports Medicine

## 2022-09-29 ENCOUNTER — Other Ambulatory Visit: Payer: Self-pay

## 2022-09-29 DIAGNOSIS — M5441 Lumbago with sciatica, right side: Secondary | ICD-10-CM | POA: Diagnosis not present

## 2022-09-29 DIAGNOSIS — M25511 Pain in right shoulder: Secondary | ICD-10-CM | POA: Diagnosis not present

## 2022-09-29 DIAGNOSIS — G8929 Other chronic pain: Secondary | ICD-10-CM

## 2022-09-29 DIAGNOSIS — M19011 Primary osteoarthritis, right shoulder: Secondary | ICD-10-CM

## 2022-09-29 MED ORDER — LIDOCAINE HCL 1 % IJ SOLN
2.0000 mL | INTRAMUSCULAR | Status: AC | PRN
Start: 2022-09-29 — End: 2022-09-29
  Administered 2022-09-29: 2 mL

## 2022-09-29 MED ORDER — BUPIVACAINE HCL 0.25 % IJ SOLN
2.0000 mL | INTRAMUSCULAR | Status: AC | PRN
Start: 2022-09-29 — End: 2022-09-29
  Administered 2022-09-29: 2 mL via INTRA_ARTICULAR

## 2022-09-29 MED ORDER — METHYLPREDNISOLONE ACETATE 40 MG/ML IJ SUSP
40.0000 mg | INTRAMUSCULAR | Status: AC | PRN
Start: 2022-09-29 — End: 2022-09-29
  Administered 2022-09-29: 40 mg via INTRA_ARTICULAR

## 2022-09-29 NOTE — Progress Notes (Signed)
Office & Procedure Note  Patient: [name]             Date of Birth: 1940-06-18           MRN: [id number]             Visit Date: 09/29/2022  HPI: Riona is a very pleasant 82 year-old female who presents for acute on chronic right shoulder pain. Did have SI-joint injection by myself on 09/08/22 as well - feels like this helped improved her pain to some extent and improved some of her radiating R-leg pain, but still experiencing some of this. Right shoulder was doing well but had a fall that aggravated the shoulder. Discussed at last visit if not improving with meds, HEP, that we could consider an injection. The shoulder is still giving her issues and she would like to proceed with injection today.  PE: - Right shoulder: There is no redness, swelling or effusion.  Forward flexion to approximately 70 degrees, I am able to take.  Further passively to about 115 degrees, lateral abduction 80 degrees to 120 degrees.  There is some grating through range of motion.  Positive drop arm test and there is some weakness with resisted ER.   Imaging: Narrative & Impression  CLINICAL DATA:  Chronic right shoulder pain and weakness.   EXAM: CT OF THE UPPER RIGHT EXTREMITY WITH CONTRAST (CT ARTHROGRAM)   TECHNIQUE: Multidetector CT imaging of the right shoulder was performed according to the standard protocol following intra-articular contrast administration.   CONTRAST:  See injection documentation.   COMPARISON:  Right shoulder x-rays dated January 12, 2020.   FINDINGS: Rotator cuff: Chronic full-thickness, full width tears of the supraspinatus and infraspinatus tendons with significant retraction. Intact subscapularis and teres minor tendons.   Muscles: Mild supraspinatus and moderate infraspinatus muscle atrophy.   Biceps long head:  Not visualized.   Acromioclavicular Joint: The acromion is type I. There are mild acromioclavicular degenerative changes. Large amount of contrast  in the subacromial/subdeltoid bursa. Contrast extends inferiorly into the substance of the biceps muscle.   Glenohumeral Joint: Distended with intra-articular contrast. Moderate to severe joint space narrowing with diffuse cartilage thinning and areas of full-thickness cartilage loss.   Labrum:  The superior labrum is degenerated and torn.   Bones: No acute fracture or dislocation. High-riding humeral head abutting the acromion.   Other: None.   IMPRESSION: 1. Chronic full-thickness, full width tears of the supraspinatus and infraspinatus tendons with significant retraction. Mild supraspinatus and moderate infraspinatus muscle atrophy. 2. Moderate to severe glenohumeral osteoarthritis.     Electronically Signed   By: Obie Dredge M.D.   On: 05/13/2020 09:43   Visit Diagnoses:  1. Primary osteoarthritis, right shoulder   2. Chronic right shoulder pain   3. Chronic right-sided low back pain with right-sided sciatica    Procedures: Large Joint Inj: R glenohumeral on 09/29/2022 1:36 PM Indications: pain Details: 22 G 3.5 in needle, ultrasound-guided posterior approach Medications: 2 mL lidocaine 1 %; 2 mL bupivacaine 0.25 %; 40 mg methylPREDNISolone acetate 40 MG/ML Outcome: tolerated well, no immediate complications  US-guided glenohumeral joint injection, right shoulder After discussion on risks/benefits/indications, informed verbal consent was obtained. A timeout was then performed. The patient was positioned lying lateral recumbent on examination table. The patient's shoulder was prepped with betadine and multiple alcohol swabs and utilizing ultrasound guidance, the patient's glenohumeral joint was identified on ultrasound. Using ultrasound guidance a 22-gauge, 3.5 inch needle with a mixture of  2:2:1 cc's lidocaine:bupivicaine:depomedrol was directed from a lateral to medial direction via in-plane technique into the glenohumeral joint with visualization of appropriate spread  of injectate into the joint. Patient tolerated the procedure well without immediate complications.       Procedure, treatment alternatives, risks and benefits explained, specific risks discussed. Consent was given by the patient. Immediately prior to procedure a time out was called to verify the correct patient, procedure, equipment, support staff and site/side marked as required. Patient was prepped and draped in the usual sterile fashion.     Plan: -Discussed treatment options for her right shoulder pain which is a combination of severe osteoarthritis as well as rotator cuff arthropathy -could proceed with ultrasound-guided glenohumeral joint injection, patient tolerated well -Would like her to continue her home exercise program for the shoulder range of motion and strengthening exercises after 48 hours of modified rest -The SI joint injection did help relieve some of her pain and some of the radicular symptoms, but she is still having some pain shooting down the right leg, does have an appointment with [name] for further evaluation of her back and help with guidance with flare a lumbar spinal injection may be beneficial for her -She has done aquatic therapy at the local [facility] in the past, did recommend her doing this.  If she is unable to get into her [facility], we can always consider an aquatic-based physical therapy referral to arthropathy show patient, she will keep me updated  Madelyn Brunner, DO Primary Care Sports Medicine Physician  [facility]  This note was dictated using Dragon naturally speaking software and may contain errors in syntax, spelling, or content which have not been identified prior to signing this note.

## 2022-09-29 NOTE — Progress Notes (Deleted)
Patient was instructed in 10 minutes of therapeutic exercises for right shoulder to improve strength, ROM and function according to my instructions and plan of care by a Certified Athletic Trainer during the office visit. A customized handout was provided and demonstration of proper technique shown and discussed. Patient did perform exercises and demonstrate understanding through teachback.  All questions discussed and answered.  

## 2022-10-09 ENCOUNTER — Encounter: Payer: Self-pay | Admitting: Orthopedic Surgery

## 2022-10-09 ENCOUNTER — Ambulatory Visit: Payer: Medicare Other | Admitting: Orthopedic Surgery

## 2022-10-09 VITALS — BP 107/67 | HR 65 | Ht 64.0 in | Wt 140.0 lb

## 2022-10-09 DIAGNOSIS — M5416 Radiculopathy, lumbar region: Secondary | ICD-10-CM | POA: Diagnosis not present

## 2022-10-09 MED ORDER — PEG 3350-KCL-NA BICARB-NACL 420 G PO SOLR: 4000.0000 mL | Freq: Once | ORAL | 0 refills | Status: AC

## 2022-10-09 NOTE — Addendum Note (Signed)
Addended by: Marlowe Shores on: 10/09/2022 04:55 PM   Modules accepted: Orders

## 2022-10-09 NOTE — Addendum Note (Signed)
Addended by: Willia Craze on: 10/09/2022 06:35 PM   Modules accepted: Orders

## 2022-10-09 NOTE — Progress Notes (Addendum)
Orthopedic Spine Surgery Office Note  Assessment: Patient is a 82 y.o. female with chronic, progressive low back pain that radiates into the right lower extremity (possible L4 or L5 distribution). Has a degenerative scoliosis with concavity on the right   Plan: -Patient has tried PT, oral steroids, intramuscular steroid injections, oxycodone, gabapentin, cymbalta  -Since patient has been trying conservative treatments for over a year without any relief of her pain, recommended MRI of the lumbar spine to evaluate further -Can continue with her current pain regimen: oxycodone, gabapentin, cymbalta -Will call patient with the results of the MRI and discuss possible injection   Patient expressed understanding of the plan and all questions were answered to the patient's satisfaction.   ___________________________________________________________________________   History:  Patient is a 82 y.o. female who presents today for lumbar spine. Patient has had several years of progressively worsening back pain. There was no trauma or injury that preceded the onset of pain. Pain has now started to radiate into the right leg in the last year. She says that this has gotten more significant as time has gone on. She does not feel that her pain medication is able to get her to a tolerable amount of pain. No pain radiating into the left leg. The pain in her right leg is felt along the lateral thigh and into the lateral leg. She feels pain is worse with activity.    Weakness: yes, feels weaker in her legs Symptoms of imbalance: yes, feels off balance for awhile now and uses a cane Bowel or bladder incontinence: denies Saddle anesthesia: denies  Treatments tried: PT, oral steroids, intramuscular steroid injections, oxycodone, gabapentin, cymbalta  Review of systems: Denies fevers and chills, night sweats, unexplained weight loss. Has history of breast cancer. Has had pain that wakes her at night  Past  medical history: Myasthenia gravis Fibromyalgia Migraines History of breast cancer Osteoporosis Atrial fibrillation OSA Vertigo Chronic pain Depression/anxiety GERD HTN Renal insufficiency  Allergies: nitrofurantoin, ampicillin, clarithromycin, doxycycline, moxifloxacin, tetracyclines  Past surgical history:  Pacemaker insertion Bilateral TKA Breast cancer excision Cholecystectomy Hysterectomy Paraganglioma excision Nasal sinus surgery  Social history: Denies use of nicotine product (smoking, vaping, patches, smokeless) Alcohol use: denies Denies recreational drug use   Physical Exam:  General: no acute distress, appears stated age Neurologic: alert, answering questions appropriately, following commands Respiratory: unlabored breathing on room air, symmetric chest rise Psychiatric: appropriate affect, normal cadence to speech   MSK (spine):  -Strength exam      Left  Right EHL    5/5  5/5 TA    5/5  5/5 GSC    5/5  5/5 Knee extension  5/5  5/5 Hip flexion   5/5  5/5  -Sensory exam    Sensation intact to light touch in L3-S1 nerve distributions of bilateral lower extremities  -Seated straight leg raise: negative bilaterally -Clonus: 2 beats of clonus on the left, none on the right  Imaging: XR of the lumbar spine from 09/08/2022 was independently reviewed and interpreted, showing lumbar degenerative scoliosis with apex to the left and concavity to the right. Apex is at L3. Lateral listhesis at L4/5. No fracture or dislocation seen. No evidence of instability on flexion/extension views.    Patient name: [name] Patient MRN: [id number] Date of visit: 10/09/22

## 2022-10-10 ENCOUNTER — Telehealth (INDEPENDENT_AMBULATORY_CARE_PROVIDER_SITE_OTHER): Payer: Self-pay | Admitting: Gastroenterology

## 2022-10-10 ENCOUNTER — Encounter (INDEPENDENT_AMBULATORY_CARE_PROVIDER_SITE_OTHER): Payer: Self-pay

## 2022-10-10 NOTE — Telephone Encounter (Signed)
Pt has been scheduled for TCS/EGD for 10/31/22. Instructions will be mailed once clearance received. Clearance sent today to hold Eliquis.

## 2022-10-10 NOTE — Telephone Encounter (Signed)
Patient with diagnosis of afib on Eliquis for anticoagulation.    Procedure: EGD/colonoscopy Date of procedure: 10/31/22  CHA2DS2-VASc Score = 5  This indicates a 7.2% annual risk of stroke. The patient's score is based upon: CHF History: 1 HTN History: 1 Diabetes History: 0 Stroke History: 0 Vascular Disease History: 0 Age Score: 2 Gender Score: 1   CrCl 49mL/min Platelet count 144K  Per office protocol, patient can hold Eliquis for 1-2 days prior to procedure.    **This guidance is not considered finalized until pre-operative APP has relayed final recommendations.**

## 2022-10-10 NOTE — Telephone Encounter (Signed)
   Patient Name: [name]  DOB: Apr 29, 1940 MRN: [id number]  Primary Cardiologist: Donato Schultz, MD  Clinical pharmacists have reviewed the patient's past medical history, labs, and current medications as part of preoperative protocol coverage. The following recommendations have been made:  Patient with diagnosis of afib on Eliquis for anticoagulation.     Procedure: EGD/colonoscopy Date of procedure: 10/31/22   CHA2DS2-VASc Score = 5  This indicates a 7.2% annual risk of stroke. The patient's score is based upon: CHF History: 1 HTN History: 1 Diabetes History: 0 Stroke History: 0 Vascular Disease History: 0 Age Score: 2 Gender Score: 1   CrCl 5mL/min Platelet count 144K   Per office protocol, patient can hold Eliquis for 1-2 days prior to procedure.  Please resume Eliquis as soon as possible postprocedure, at the discretion of the surgeon.   I will route this recommendation to the requesting party via Epic fax function and remove from pre-op pool.  Please call with questions.  Joylene Grapes, NP 10/10/2022, 4:03 PM

## 2022-10-10 NOTE — Telephone Encounter (Signed)
    10/10/22  [name] 23-Oct-1940  What type of surgery is being performed? EGD/Colonoscopy  When is surgery scheduled? 10/31/22  Clearance to hold Eliquis  Name of physician performing surgery?  Dr. Katrinka Blazing [facility] Phone: [phone] Fax: [phone]  Anethesia type (none, local, MAC, general)? MAC

## 2022-10-11 ENCOUNTER — Encounter: Payer: Self-pay | Admitting: Orthopedic Surgery

## 2022-10-11 ENCOUNTER — Encounter (INDEPENDENT_AMBULATORY_CARE_PROVIDER_SITE_OTHER): Payer: Self-pay

## 2022-10-11 NOTE — Telephone Encounter (Signed)
Eliquis may be held 1-2 days prior to procedure per cardiology. Instructions mailed to pt with pre op appt.

## 2022-10-18 ENCOUNTER — Encounter: Payer: Self-pay | Admitting: Neurology

## 2022-10-25 NOTE — Patient Instructions (Signed)
Your procedure is scheduled on: 10/31/2022  Report to [facility] Main Entrance at   8:00  AM.  Call this number if you have problems the morning of surgery: [phone]   Remember:              Follow Directions on the letter you received from Your Physician's office regarding the Bowel Prep              No Smoking the day of Procedure :   Take these medicines the morning of surgery with A SIP OF WATER:Atenolol, Prevacid, levothyroxine, and oxycodone if needed  Hold Eliquis as directed in letter for 2 days last dose 10/28/2022    Do not wear jewelry, make-up or nail polish.    Do not bring valuables to the hospital.  Contacts, dentures or bridgework may not be worn into surgery.  .   Patients discharged the day of surgery will not be allowed to drive home.     Colonoscopy, Adult, Care After This sheet gives you information about how to care for yourself after your procedure. Your health care provider may also give you more specific instructions. If you have problems or questions, contact your health care provider. What can I expect after the procedure? After the procedure, it is common to have: A small amount of blood in your stool for 24 hours after the procedure. Some gas. Mild abdominal cramping or bloating.  Follow these instructions at home: General instructions  For the first 24 hours after the procedure: Do not drive or use machinery. Do not sign important documents. Do not drink alcohol. Do your regular daily activities at a slower pace than normal. Eat soft, easy-to-digest foods. Rest often. Take over-the-counter or prescription medicines only as told by your health care provider. It is up to you to get the results of your procedure. Ask your health care provider, or the department performing the procedure, when your results will be ready. Relieving cramping and bloating Try walking around when you have cramps or feel bloated. Apply heat to your abdomen as told  by your health care provider. Use a heat source that your health care provider recommends, such as a moist heat pack or a heating pad. Place a towel between your skin and the heat source. Leave the heat on for 20-30 minutes. Remove the heat if your skin turns bright red. This is especially important if you are unable to feel pain, heat, or cold. You may have a greater risk of getting burned. Eating and drinking Drink enough fluid to keep your urine clear or pale yellow. Resume your normal diet as instructed by your health care provider. Avoid heavy or fried foods that are hard to digest. Avoid drinking alcohol for as long as instructed by your health care provider. Contact a health care provider if: You have blood in your stool 2-3 days after the procedure. Get help right away if: You have more than a small spotting of blood in your stool. You pass large blood clots in your stool. Your abdomen is swollen. You have nausea or vomiting. You have a fever. You have increasing abdominal pain that is not relieved with medicine. This information is not intended to replace advice given to you by your health care provider. Make sure you discuss any questions you have with your health care provider. Document Released: 10/19/2003 Document Revised: 11/29/2015 Document Reviewed: 05/18/2015 Elsevier Interactive Patient Education  2018 [employer] Upper Endoscopy, Adult, Care After After the  procedure, it is common to have a sore throat. It is also common to have: Mild stomach pain or discomfort. Bloating. Nausea. Follow these instructions at home: The instructions below may help you care for yourself at home. Your health care provider may give you more instructions. If you have questions, ask your health care provider. If you were given a sedative during the procedure, it can affect you for several hours. Do not drive or operate machinery until your health care provider says that it is safe. If you  will be going home right after the procedure, plan to have a responsible adult: Take you home from the hospital or clinic. You will not be allowed to drive. Care for you for the time you are told. Follow instructions from your health care provider about what you may eat and drink. Return to your normal activities as told by your health care provider. Ask your health care provider what activities are safe for you. Take over-the-counter and prescription medicines only as told by your health care provider. Contact a health care provider if you: Have a sore throat that lasts longer than one day. Have trouble swallowing. Have a fever. Get help right away if you: Vomit blood or your vomit looks like coffee grounds. Have bloody, black, or tarry stools. Have a very bad sore throat or you cannot swallow. Have difficulty breathing or very bad pain in your chest or abdomen. These symptoms may be an emergency. Get help right away. Call 911. Do not wait to see if the symptoms will go away. Do not drive yourself to the hospital. Summary After the procedure, it is common to have a sore throat, mild stomach discomfort, bloating, and nausea. If you were given a sedative during the procedure, it can affect you for several hours. Do not drive until your health care provider says that it is safe. Follow instructions from your health care provider about what you may eat and drink. Return to your normal activities as told by your health care provider. This information is not intended to replace advice given to you by your health care provider. Make sure you discuss any questions you have with your health care provider. Document Revised: 06/15/2021 Document Reviewed: 06/15/2021 Elsevier Patient Education  2024 [employer].

## 2022-10-27 ENCOUNTER — Encounter (HOSPITAL_COMMUNITY)
Admission: RE | Admit: 2022-10-27 | Discharge: 2022-10-27 | Disposition: A | Discharge status: Home / Self Care | Payer: Medicare Other | Source: Ambulatory Visit | Attending: Gastroenterology | Admitting: Gastroenterology

## 2022-10-27 VITALS — Ht 64.0 in | Wt 140.0 lb

## 2022-10-27 DIAGNOSIS — I10 Essential (primary) hypertension: Secondary | ICD-10-CM | POA: Insufficient documentation

## 2022-10-27 DIAGNOSIS — Z01810 Encounter for preprocedural cardiovascular examination: Secondary | ICD-10-CM | POA: Insufficient documentation

## 2022-10-27 NOTE — Progress Notes (Signed)
Dr Alva Garnet reviewed patient's EKG.   No orders given

## 2022-10-31 ENCOUNTER — Ambulatory Visit (HOSPITAL_BASED_OUTPATIENT_CLINIC_OR_DEPARTMENT_OTHER): Payer: Medicare Other | Admitting: Anesthesiology

## 2022-10-31 ENCOUNTER — Other Ambulatory Visit: Payer: Self-pay

## 2022-10-31 ENCOUNTER — Ambulatory Visit (HOSPITAL_COMMUNITY): Payer: Medicare Other | Admitting: Anesthesiology

## 2022-10-31 ENCOUNTER — Encounter (HOSPITAL_COMMUNITY)
Admission: RE | Disposition: A | Discharge status: Home / Self Care | Payer: Self-pay | Source: Home / Self Care | Attending: Gastroenterology

## 2022-10-31 ENCOUNTER — Encounter (HOSPITAL_COMMUNITY): Payer: Self-pay | Admitting: Gastroenterology

## 2022-10-31 ENCOUNTER — Ambulatory Visit (HOSPITAL_COMMUNITY)
Admission: RE | Admit: 2022-10-31 | Discharge: 2022-10-31 | Disposition: A | Discharge status: Home / Self Care | Payer: Medicare Other | Source: Home / Self Care | Attending: Gastroenterology | Admitting: Gastroenterology

## 2022-10-31 ENCOUNTER — Ambulatory Visit (INDEPENDENT_AMBULATORY_CARE_PROVIDER_SITE_OTHER): Payer: Medicare Other

## 2022-10-31 DIAGNOSIS — E782 Mixed hyperlipidemia: Secondary | ICD-10-CM | POA: Diagnosis not present

## 2022-10-31 DIAGNOSIS — K648 Other hemorrhoids: Secondary | ICD-10-CM | POA: Diagnosis not present

## 2022-10-31 DIAGNOSIS — D123 Benign neoplasm of transverse colon: Secondary | ICD-10-CM | POA: Insufficient documentation

## 2022-10-31 DIAGNOSIS — D128 Benign neoplasm of rectum: Secondary | ICD-10-CM | POA: Insufficient documentation

## 2022-10-31 DIAGNOSIS — I4891 Unspecified atrial fibrillation: Secondary | ICD-10-CM | POA: Insufficient documentation

## 2022-10-31 DIAGNOSIS — Z7901 Long term (current) use of anticoagulants: Secondary | ICD-10-CM | POA: Insufficient documentation

## 2022-10-31 DIAGNOSIS — K319 Disease of stomach and duodenum, unspecified: Secondary | ICD-10-CM | POA: Insufficient documentation

## 2022-10-31 DIAGNOSIS — K219 Gastro-esophageal reflux disease without esophagitis: Secondary | ICD-10-CM

## 2022-10-31 DIAGNOSIS — R1013 Epigastric pain: Secondary | ICD-10-CM | POA: Diagnosis present

## 2022-10-31 DIAGNOSIS — Z950 Presence of cardiac pacemaker: Secondary | ICD-10-CM | POA: Insufficient documentation

## 2022-10-31 DIAGNOSIS — I110 Hypertensive heart disease with heart failure: Secondary | ICD-10-CM | POA: Diagnosis not present

## 2022-10-31 DIAGNOSIS — E039 Hypothyroidism, unspecified: Secondary | ICD-10-CM | POA: Diagnosis not present

## 2022-10-31 DIAGNOSIS — I509 Heart failure, unspecified: Secondary | ICD-10-CM | POA: Insufficient documentation

## 2022-10-31 DIAGNOSIS — K6389 Other specified diseases of intestine: Secondary | ICD-10-CM | POA: Diagnosis not present

## 2022-10-31 DIAGNOSIS — D447 Neoplasm of uncertain behavior of aortic body and other paraganglia: Secondary | ICD-10-CM | POA: Insufficient documentation

## 2022-10-31 DIAGNOSIS — I130 Hypertensive heart and chronic kidney disease with heart failure and stage 1 through stage 4 chronic kidney disease, or unspecified chronic kidney disease: Secondary | ICD-10-CM

## 2022-10-31 DIAGNOSIS — G7000 Myasthenia gravis without (acute) exacerbation: Secondary | ICD-10-CM | POA: Insufficient documentation

## 2022-10-31 DIAGNOSIS — G4730 Sleep apnea, unspecified: Secondary | ICD-10-CM | POA: Insufficient documentation

## 2022-10-31 DIAGNOSIS — K621 Rectal polyp: Secondary | ICD-10-CM | POA: Diagnosis not present

## 2022-10-31 DIAGNOSIS — Z1211 Encounter for screening for malignant neoplasm of colon: Secondary | ICD-10-CM

## 2022-10-31 DIAGNOSIS — K635 Polyp of colon: Secondary | ICD-10-CM | POA: Diagnosis not present

## 2022-10-31 DIAGNOSIS — K5730 Diverticulosis of large intestine without perforation or abscess without bleeding: Secondary | ICD-10-CM | POA: Insufficient documentation

## 2022-10-31 DIAGNOSIS — K2970 Gastritis, unspecified, without bleeding: Secondary | ICD-10-CM | POA: Diagnosis not present

## 2022-10-31 DIAGNOSIS — F418 Other specified anxiety disorders: Secondary | ICD-10-CM | POA: Diagnosis not present

## 2022-10-31 DIAGNOSIS — Z853 Personal history of malignant neoplasm of breast: Secondary | ICD-10-CM | POA: Diagnosis not present

## 2022-10-31 DIAGNOSIS — K5140 Inflammatory polyps of colon without complications: Secondary | ICD-10-CM

## 2022-10-31 DIAGNOSIS — Z79899 Other long term (current) drug therapy: Secondary | ICD-10-CM | POA: Diagnosis not present

## 2022-10-31 DIAGNOSIS — I495 Sick sinus syndrome: Secondary | ICD-10-CM | POA: Diagnosis not present

## 2022-10-31 HISTORY — PX: ESOPHAGOGASTRODUODENOSCOPY (EGD) WITH PROPOFOL: SHX5813

## 2022-10-31 HISTORY — PX: BIOPSY: SHX5522

## 2022-10-31 HISTORY — PX: COLONOSCOPY WITH PROPOFOL: SHX5780

## 2022-10-31 HISTORY — PX: POLYPECTOMY: SHX5525

## 2022-10-31 LAB — CUP PACEART REMOTE DEVICE CHECK
Battery Remaining Longevity: 92 mo
Battery Voltage: 2.99 V
Brady Statistic AP VP Percent: 0.04 %
Brady Statistic AP VS Percent: 97.96 %
Brady Statistic AS VP Percent: 0 %
Brady Statistic AS VS Percent: 2 %
Brady Statistic RA Percent Paced: 98.28 %
Brady Statistic RV Percent Paced: 0.04 %
Date Time Interrogation Session: 20240813014201
Implantable Lead Connection Status: 753985
Implantable Lead Connection Status: 753985
Implantable Lead Implant Date: 20190501
Implantable Lead Implant Date: 20190501
Implantable Lead Location: 753859
Implantable Lead Location: 753860
Implantable Lead Model: 3830
Implantable Lead Model: 5076
Implantable Pulse Generator Implant Date: 20190501
Lead Channel Impedance Value: 323 Ohm
Lead Channel Impedance Value: 323 Ohm
Lead Channel Impedance Value: 361 Ohm
Lead Channel Impedance Value: 456 Ohm
Lead Channel Pacing Threshold Amplitude: 0.5 V
Lead Channel Pacing Threshold Amplitude: 0.75 V
Lead Channel Pacing Threshold Pulse Width: 0.4 ms
Lead Channel Pacing Threshold Pulse Width: 0.4 ms
Lead Channel Sensing Intrinsic Amplitude: 0.5 mV
Lead Channel Sensing Intrinsic Amplitude: 0.5 mV
Lead Channel Sensing Intrinsic Amplitude: 9.75 mV
Lead Channel Sensing Intrinsic Amplitude: 9.75 mV
Lead Channel Setting Pacing Amplitude: 2 V
Lead Channel Setting Pacing Amplitude: 2.5 V
Lead Channel Setting Pacing Pulse Width: 0.4 ms
Lead Channel Setting Sensing Sensitivity: 0.9 mV
Zone Setting Status: 755011
Zone Setting Status: 755011

## 2022-10-31 LAB — HM COLONOSCOPY

## 2022-10-31 SURGERY — ESOPHAGOGASTRODUODENOSCOPY (EGD) WITH PROPOFOL
Anesthesia: General

## 2022-10-31 MED ORDER — LANSOPRAZOLE 30 MG PO CPDR
30.0000 mg | DELAYED_RELEASE_CAPSULE | Freq: Two times a day (BID) | ORAL | 3 refills | Status: AC
Start: 2022-10-31 — End: ?

## 2022-10-31 MED ORDER — STERILE WATER FOR IRRIGATION IR SOLN
Status: DC | PRN
  Administered 2022-10-31: 100 mL

## 2022-10-31 MED ORDER — LACTATED RINGERS IV SOLN: INTRAVENOUS | Status: DC

## 2022-10-31 MED ORDER — LIDOCAINE HCL 1 % IJ SOLN
INTRAMUSCULAR | Status: DC | PRN
  Administered 2022-10-31: 50 mg via INTRADERMAL

## 2022-10-31 MED ORDER — PROPOFOL 500 MG/50ML IV EMUL
INTRAVENOUS | Status: DC | PRN
  Administered 2022-10-31: 150 ug/kg/min via INTRAVENOUS

## 2022-10-31 MED ORDER — PROPOFOL 10 MG/ML IV BOLUS
INTRAVENOUS | Status: DC | PRN
Start: 2022-10-31 — End: 2022-10-31
  Administered 2022-10-31: 20 mg via INTRAVENOUS
  Administered 2022-10-31: 3 mg via INTRAVENOUS
  Administered 2022-10-31 (×2): 50 mg via INTRAVENOUS

## 2022-10-31 NOTE — Anesthesia Preprocedure Evaluation (Signed)
Anesthesia Evaluation  Patient identified by MRN, date of birth, ID band Patient awake    Reviewed: Allergy & Precautions, H&P , NPO status , Patient's Chart, lab work & pertinent test results  History of Anesthesia Complications (+) history of anesthetic complications (hypotension, elevated heart rate and oxygenation desaturation)  Airway Mallampati: II  TM Distance: >3 FB Neck ROM: Full    Dental no notable dental hx. (+) Poor Dentition, Chipped, Missing, Dental Advisory Given   Pulmonary shortness of breath and with exertion, sleep apnea and Continuous Positive Airway Pressure Ventilation    Pulmonary exam normal breath sounds clear to auscultation       Cardiovascular Exercise Tolerance: Poor hypertension, Pt. on medications +CHF  Normal cardiovascular exam+ dysrhythmias Atrial Fibrillation + pacemaker  Rhythm:Regular Rate:Normal     Neuro/Psych  Headaches PSYCHIATRIC DISORDERS Anxiety Depression     Neuromuscular disease (Myasthenia gravis)    GI/Hepatic Neg liver ROS,GERD  Medicated and Controlled,,  Endo/Other  Hypothyroidism    Renal/GU Renal InsufficiencyRenal disease  negative genitourinary   Musculoskeletal  (+) Arthritis , Osteoarthritis,    Abdominal   Peds negative pediatric ROS (+)  Hematology  (+) Blood dyscrasia, anemia   Anesthesia Other Findings   Reproductive/Obstetrics negative OB ROS                             Anesthesia Physical Anesthesia Plan  ASA: 3  Anesthesia Plan: General   Post-op Pain Management: Minimal or no pain anticipated   Induction: Intravenous  PONV Risk Score and Plan: Propofol infusion  Airway Management Planned: Nasal Cannula and Natural Airway  Additional Equipment:   Intra-op Plan:   Post-operative Plan:   Informed Consent: I have reviewed the patients History and Physical, chart, labs and discussed the procedure including the  risks, benefits and alternatives for the proposed anesthesia with the patient or authorized representative who has indicated his/her understanding and acceptance.     Dental advisory given  Plan Discussed with: CRNA and Surgeon  Anesthesia Plan Comments:         Anesthesia Quick Evaluation

## 2022-10-31 NOTE — Transfer of Care (Signed)
Immediate Anesthesia Transfer of Care Note  Patient: [name]  Procedure(s) Performed: ESOPHAGOGASTRODUODENOSCOPY (EGD) WITH PROPOFOL COLONOSCOPY WITH PROPOFOL BIOPSY POLYPECTOMY  Patient Location: Short Stay  Anesthesia Type:General  Level of Consciousness: awake  Airway & Oxygen Therapy: Patient Spontanous Breathing  Post-op Assessment: Report given to RN  Post vital signs: Reviewed and stable  Last Vitals:  Vitals Value Taken Time  BP    Temp    Pulse    Resp    SpO2      Last Pain:  Vitals:   10/31/22 0947  TempSrc:   PainSc: 0-No pain         Complications: No notable events documented.

## 2022-10-31 NOTE — Op Note (Signed)
[facility] Patient Name: [name] Procedure Date: 10/31/2022 9:41 AM MRN: [id number] Date of Birth: 28-Apr-1940 Attending MD: Katrinka Blazing , , [id number] CSN: [id number] Age: 82 Admit Type: Outpatient Procedure:                Upper GI endoscopy Indications:              Epigastric abdominal pain, Weight loss, Hyporexia Providers:                Katrinka Blazing, Buel Ream. Thomasena Edis RN, RN,                            Zena Amos Referring MD:              Medicines:                Monitored Anesthesia Care Complications:            No immediate complications. Estimated Blood Loss:     Estimated blood loss: none. Procedure:                Pre-Anesthesia Assessment:                           - Prior to the procedure, a History and Physical                            was performed, and patient medications, allergies                            and sensitivities were reviewed. The patient's                            tolerance of previous anesthesia was reviewed.                           - The risks and benefits of the procedure and the                            sedation options and risks were discussed with the                            patient. All questions were answered and informed                            consent was obtained.                           - ASA Grade Assessment: III - A patient with severe                            systemic disease.                           After obtaining informed consent, the endoscope was                            passed under direct vision. Throughout  the                            procedure, the patient's blood pressure, pulse, and                            oxygen saturations were monitored continuously. The                            [id number]-H190 ([id number]) scope was introduced through the                            mouth, and advanced to the second part of duodenum.                            The upper GI endoscopy was  accomplished without                            difficulty. The patient tolerated the procedure                            well. Scope In: 9:52:30 AM Scope Out: 10:00:04 AM Total Procedure Duration: 0 hours 7 minutes 34 seconds  Findings:      The examined esophagus was normal.      Patchy moderate inflammation characterized by adherent blood, congestion       (edema) and erythema was found in the gastric body and in the gastric       antrum. Biopsies were taken with a cold forceps for Helicobacter pylori       testing.      The examined duodenum was normal. Impression:               - Normal esophagus.                           - Gastritis. Biopsied.                           - Normal examined duodenum. Moderate Sedation:      Per Anesthesia Care Recommendation:           - Discharge patient to home (ambulatory).                           - Resume previous diet.                           - Await pathology results.                           - Use Prevacid (lansoprazole) 30 mg PO BID.                           - If negative biopsies, proceed with breath test                            off ppi for 2 weeks. Procedure Code(s):        ---  Professional ---                           [phone], Esophagogastroduodenoscopy, flexible,                            transoral; with biopsy, single or multiple Diagnosis Code(s):        --- Professional ---                           K29.70, Gastritis, unspecified, without bleeding                           R10.13, Epigastric pain                           R63.4, Abnormal weight loss CPT copyright 2022 American Medical Association. All rights reserved. The codes documented in this report are preliminary and upon coder review may  be revised to meet current compliance requirements. Katrinka Blazing, MD Katrinka Blazing,  10/31/2022 10:06:29 AM This report has been signed electronically. Number of Addenda: 0

## 2022-10-31 NOTE — Anesthesia Postprocedure Evaluation (Signed)
Anesthesia Post Note  Patient: AJANA DITSWORTH  Procedure(s) Performed: ESOPHAGOGASTRODUODENOSCOPY (EGD) WITH PROPOFOL COLONOSCOPY WITH PROPOFOL BIOPSY POLYPECTOMY  Patient location during evaluation: Short Stay Anesthesia Type: General Level of consciousness: awake and alert Pain management: pain level controlled Vital Signs Assessment: post-procedure vital signs reviewed and stable Respiratory status: spontaneous breathing Cardiovascular status: blood pressure returned to baseline and stable Postop Assessment: no apparent nausea or vomiting Anesthetic complications: no   No notable events documented.   Last Vitals:  Vitals:   10/31/22 0838  BP: 125/81  Pulse: 69  Resp: (!) 21  Temp: 36.7 C  SpO2: 97%    Last Pain:  Vitals:   10/31/22 0947  TempSrc:   PainSc: 0-No pain                 ,

## 2022-10-31 NOTE — H&P (Signed)
[name] is an 82 y.o. female.   Chief Complaint: weight loss and CRC screening HPI: [name] is a 82 y.o. female with PMH GERD, atrial fibrillation, paraganglioma, GERD, IBS, hypothyroidism, breast cancer, obesity, myasthenia gravis, hyperlipidemia, IBS and GERD, who presents for follow up of weight loss and colorectal cancer screening.  Patient has presented persistent weight loss as she has felt poor appetite.  Denies any melena, hematochezia.  States that she has had some episode of nausea for which she has taken Zofran.  Very occasional episodes of epigastric abdominal pain.  Past Medical History:  Diagnosis Date   Anemia    Atrial fibrillation (HCC)    Breast cancer (HCC) 09/2019   right breast    Chronic back pain    Chronic nausea    Complication of anesthesia    hypotension, elevated heart rate and oxygenation desaturation   Depression with anxiety    Diverticulosis    Dyspnea    due to myasthenia gravis   Essential hypertension    GERD (gastroesophageal reflux disease)    History of pneumonia    Hypothyroidism    IBS (irritable colon syndrome)    LVH (left ventricular hypertrophy)    Migraines    Mixed hyperlipidemia    Obesity    Ocular myasthenia gravis (HCC)    Osteoarthritis of knee    Pacemaker    PAF (paroxysmal atrial fibrillation) (HCC)    chads2vasc score is at least 4   Paraganglioma (HCC)    Resection 02/2012 (retroperitoneal)   Paraganglioma, malignant (HCC) 11/06/2015   Presence of permanent cardiac pacemaker 07/18/2017   Renal insufficiency    Skin cancer     Past Surgical History:  Procedure Laterality Date   ABDOMINAL HYSTERECTOMY     BREAST BIOPSY Right 09/2019   BREAST LUMPECTOMY Right 10/28/2019   CARDIAC CATHETERIZATION     CHOLECYSTECTOMY     COLONOSCOPY  08/24/2011   Procedure: COLONOSCOPY;  Surgeon: Malissa Hippo, MD;  Location: [facility];  Service: Endoscopy;  Laterality: N/A;  1200   CT guided biopsy  01/16/12    GIVENS CAPSULE STUDY  01/29/2012   Procedure: GIVENS CAPSULE STUDY;  Surgeon: Malissa Hippo, MD;  Location: [facility];  Service: Endoscopy;  Laterality: N/A;  730   KNEE SURGERY     LAPAROTOMY  03/01/2012   Procedure: EXPLORATORY LAPAROTOMY;  Surgeon: Velora Heckler, MD;  Location: [facility];  Service: General;  Laterality: N/A;  Exploratory Laparotomy ,Resection Retroperitoneal Mass   MASTECTOMY, PARTIAL Right 10/28/2019   Procedure: RIGHT BREAST PARTIAL MASTECTOMY;  Surgeon: Darnell Level, MD;  Location: [facility];  Service: General;  Laterality: Right;   NASAL SINUS SURGERY     30 yrs ago   PACEMAKER IMPLANT N/A 07/18/2017   Procedure: PACEMAKER IMPLANT;  Surgeon: Marinus Maw, MD;  Location: [facility];  Service: Cardiovascular;  Laterality: N/A;   RIGHT/LEFT HEART CATH AND CORONARY ANGIOGRAPHY N/A 11/18/2018   Procedure: RIGHT/LEFT HEART CATH AND CORONARY ANGIOGRAPHY;  Surgeon: Lyn Records, MD;  Location: [facility];  Service: Cardiovascular;  Laterality: N/A;   TOTAL KNEE ARTHROPLASTY Bilateral    2005 and 2011    Family History  Problem Relation Age of Onset   Inflammatory bowel disease Maternal Grandmother    Sleep apnea Daughter    Sleep apnea Daughter    Sleep apnea Son    Social History:  reports that she has never  smoked. She has never been exposed to tobacco smoke. She has never used smokeless tobacco. She reports that she does not drink alcohol and does not use drugs.  Allergies:  Allergies  Allergen Reactions   Nitrofurantoin Macrocrystal Nausea And Vomiting   Ampicillin Rash    Did it involve swelling of the face/tongue/throat, SOB, or low BP? No Did it involve sudden or severe rash/hives, skin peeling, or any reaction on the inside of your mouth or nose? No Did you need to seek medical attention at a hospital or doctor's office? No When did it last happen? 20 years ago     If all above answers are "NO", may proceed with  cephalosporin use.    Avocado Diarrhea and Nausea And Vomiting   Biaxin [Clarithromycin] Rash   Doxycycline Monohydrate Nausea And Vomiting   Moxifloxacin     Contraindication myasthenia gravis   Sensi-Care Protective Barrier [Petrolatum-Zinc Oxide] Rash   Tetracyclines & Related Nausea And Vomiting    Medications Prior to Admission  Medication Sig Dispense Refill   ALPRAZolam (XANAX) 0.5 MG tablet Take 1 mg by mouth 2 (two) times daily. Takes as needed.     anastrozole (ARIMIDEX) 1 MG tablet TAKE (1) TABLET BY MOUTH ONCE DAILY. 90 tablet 0   Artificial Tear Solution (SOOTHE XP OP) Place 1 drop into both eyes 4 (four) times daily.     atenolol (TENORMIN) 25 MG tablet TAKE (1) TABLET BY MOUTH ONCE DAILY. MAY TAKE AN ADDITIONAL TABLET IF SYSTOLIC OVER 150 OR SEVERE PALPITATIONS. 100 tablet 0   atorvastatin (LIPITOR) 20 MG tablet Take 20 mg by mouth daily.     B Complex-C (B-COMPLEX WITH VITAMIN C) tablet Take 1 tablet by mouth daily with supper.      baclofen (LIORESAL) 10 MG tablet Take 0.5 tablets (5 mg total) by mouth at bedtime as needed for muscle spasms. 15 each 3   Coenzyme Q10 (COQ10) 100 MG CAPS Take 100 mg by mouth daily.      diclofenac sodium (VOLTAREN) 1 % GEL Apply 4 g topically 4 (four) times daily as needed (for pain).      gabapentin (NEURONTIN) 300 MG capsule Take 1 capsule (300 mg total) by mouth 3 (three) times daily. (Patient taking differently: Take 400 mg by mouth 3 (three) times daily.)     lansoprazole (PREVACID) 30 MG capsule Take 1 capsule (30 mg total) by mouth daily at 12 noon. TAKE (1) CAPSULE BY MOUTH TWICE DAILY. 90 capsule 3   levothyroxine (SYNTHROID) 100 MCG tablet Take 100 mcg by mouth daily.     lidocaine (LIDODERM) 5 % Place 1-3 patches onto the skin daily as needed (for pain). Remove & Discard patch within 12 hours or as directed by MD for pain     mycophenolate (CELLCEPT) 500 MG tablet Take 1 tablet (500 mg total) by mouth 2 (two) times daily. 180  tablet 3   OVER THE COUNTER MEDICATION Calcium and vit D one bid     oxyCODONE-acetaminophen (PERCOCET) 10-325 MG tablet Take 1 tablet by mouth every 4 (four) hours as needed (pain).     predniSONE (DELTASONE) 5 MG tablet Take 1 tablet daily, will use the 1 mg tablets in addition to taper by 1 mg monthly until down to 5 mg and stay on this dose 90 tablet 1   pyridostigmine (MESTINON) 60 MG tablet Take 0.5 tablets (30 mg total) by mouth 4 (four) times daily. (Patient taking differently: Take 30 mg by mouth 2 (  two) times daily.) 180 tablet 3   Sodium Sulfate-Mag Sulfate-KCl (SUTAB) [phone] MG TABS As directed 24 tablet 0   SUMAtriptan (IMITREX) 50 MG tablet Take 50 mg by mouth every 2 (two) hours as needed. For migraines     traZODone (DESYREL) 50 MG tablet TAKE (1) TABLET BY MOUTH AT BEDTIME. 90 tablet 2   apixaban (ELIQUIS) 2.5 MG TABS tablet Take 2.5 mg by mouth 2 (two) times daily.     furosemide (LASIX) 20 MG tablet Take 1 tablet (20 mg total) by mouth as needed. 30 tablet 0   polyethylene glycol powder (GLYCOLAX/MIRALAX) 17 GM/SCOOP powder Take 8.5 g by mouth daily. (Patient taking differently: Take 8.5 g by mouth 2 (two) times daily. 1-2 8.5 mg prn.)     potassium chloride SA (KLOR-CON M) 20 MEQ tablet Take 1 tablet (20 mEq total) by mouth daily. 30 tablet 0   Sod Picosulfate-Mag Ox-Cit Acd (CLENPIQ) 10-3.5-12 MG-GM -GM/175ML SOLN Take 1 kit by mouth as directed. 350 mL 0   Terbinafine 1 % GEL Apply 1 application topically 2 (two) times daily as needed (toe nail fungus).       No results found for this or any previous visit (from the past 48 hour(s)). No results found.  Review of Systems  Constitutional:  Positive for unexpected weight change.  All other systems reviewed and are negative.   Blood pressure 125/81, pulse 69, temperature 98 F (36.7 C), temperature source Oral, resp. rate (!) 21, height 5\' 4"  (1.626 m), weight 63.5 kg, SpO2 97%. Physical Exam  GENERAL: The patient  is AO x3, in no acute distress. HEENT: Head is normocephalic and atraumatic. EOMI are intact. Mouth is well hydrated and without lesions. NECK: Supple. No masses LUNGS: Clear to auscultation. No presence of rhonchi/wheezing/rales. Adequate chest expansion HEART: RRR, normal s1 and s2. ABDOMEN: Soft, nontender, no guarding, no peritoneal signs, and nondistended. BS +. No masses. EXTREMITIES: Without any cyanosis, clubbing, rash, lesions or edema. NEUROLOGIC: AOx3, no focal motor deficit. SKIN: no jaundice, no rashes  Assessment/Plan [name] is a 82 y.o. female with PMH GERD, atrial fibrillation, paraganglioma, GERD, IBS, hypothyroidism, breast cancer, obesity, myasthenia gravis, hyperlipidemia, IBS and GERD, who presents for follow up of weight loss and colorectal cancer screening.  Will proceed with EGD and colonoscopy.  Dolores Frame, MD 10/31/2022, 9:45 AM

## 2022-10-31 NOTE — Op Note (Signed)
[facility] Patient Name: [name] Procedure Date: 10/31/2022 9:42 AM MRN: [id number] Date of Birth: [birth year] Attending MD: Katrinka Blazing , , [id number] CSN: [id number] Age: 82 Admit Type: Outpatient Procedure:                Colonoscopy Indications:              Screening for colorectal malignant neoplasm Providers:                Katrinka Blazing, Buel Ream. Thomasena Edis RN, RN,                            Zena Amos Referring MD:              Medicines:                Monitored Anesthesia Care Complications:            No immediate complications. Estimated Blood Loss:     Estimated blood loss: none. Procedure:                Pre-Anesthesia Assessment:                           - Prior to the procedure, a History and Physical                            was performed, and patient medications, allergies                            and sensitivities were reviewed. The patient's                            tolerance of previous anesthesia was reviewed.                           - The risks and benefits of the procedure and the                            sedation options and risks were discussed with the                            patient. All questions were answered and informed                            consent was obtained.                           - ASA Grade Assessment: III - A patient with severe                            systemic disease.                           After obtaining informed consent, the colonoscope                            was passed under direct vision. Throughout the  procedure, the patient's blood pressure, pulse, and                            oxygen saturations were monitored continuously. The                            [id number]-[id number] ([id number]) was introduced through the                            anus and advanced to the the cecum, identified by                            appendiceal orifice and ileocecal valve. The                             colonoscopy was performed without difficulty. The                            patient tolerated the procedure well. The quality                            of the bowel preparation was good. Scope In: 10:07:53 AM Scope Out: 10:30:53 AM Scope Withdrawal Time: 0 hours 18 minutes 49 seconds  Total Procedure Duration: 0 hours 23 minutes 0 seconds  Findings:      The perianal and digital rectal examinations were normal.      Two sessile polyps were found in the transverse colon. The polyps were 3       to 4 mm in size. These polyps were removed with a cold snare. Resection       and retrieval were complete.      A 2 mm polyp was found in the rectum. The polyp was sessile. The polyp       was removed with a cold biopsy forceps. Resection and retrieval were       complete.      Scattered medium-mouthed and small-mouthed diverticula were found in the       sigmoid colon, descending colon and transverse colon.      Non-bleeding internal hemorrhoids were found during retroflexion. The       hemorrhoids were small. Impression:               - Two 3 to 4 mm polyps in the transverse colon,                            removed with a cold snare. Resected and retrieved.                           - One 2 mm polyp in the rectum, removed with a cold                            biopsy forceps. Resected and retrieved.                           - Diverticulosis in the sigmoid colon, in the  descending colon and in the transverse colon.                           - Non-bleeding internal hemorrhoids. Moderate Sedation:      Per Anesthesia Care Recommendation:           - Discharge patient to home (ambulatory).                           - Resume previous diet.                           - Await pathology results.                           - Repeat colonoscopy is not recommended due to                            current age (4 years or older) for screening                             purposes. Procedure Code(s):        --- Professional ---                           [phone], LT, Colonoscopy, flexible; with removal of                            tumor(s), polyp(s), or other lesion(s) by snare                            technique                           45380, 59, Colonoscopy, flexible; with biopsy,                            single or multiple Diagnosis Code(s):        --- Professional ---                           Z12.11, Encounter for screening for malignant                            neoplasm of colon                           D12.3, Benign neoplasm of transverse colon (hepatic                            flexure or splenic flexure)                           D12.8, Benign neoplasm of rectum                           K64.8, Other hemorrhoids  K57.30, Diverticulosis of large intestine without                            perforation or abscess without bleeding CPT copyright 2022 American [employer] Association. All rights reserved. The codes documented in this report are preliminary and upon coder review may  be revised to meet current compliance requirements. Katrinka Blazing, MD Katrinka Blazing,  10/31/2022 10:39:52 AM This report has been signed electronically. Number of Addenda: 0

## 2022-10-31 NOTE — Discharge Instructions (Addendum)
You are being discharged to home.  Resume your previous diet.  We are waiting for your pathology results.  Take Prevacid (lansoprazole) 30 mg by mouth twice a day.  Your physician has indicated that a repeat colonoscopy is not recommended due to your current age (77 years or older) for screening purposes.

## 2022-11-01 ENCOUNTER — Other Ambulatory Visit (INDEPENDENT_AMBULATORY_CARE_PROVIDER_SITE_OTHER): Payer: Self-pay

## 2022-11-01 ENCOUNTER — Encounter (INDEPENDENT_AMBULATORY_CARE_PROVIDER_SITE_OTHER): Payer: Self-pay | Admitting: *Deleted

## 2022-11-02 ENCOUNTER — Other Ambulatory Visit (INDEPENDENT_AMBULATORY_CARE_PROVIDER_SITE_OTHER): Payer: Self-pay

## 2022-11-02 ENCOUNTER — Encounter (INDEPENDENT_AMBULATORY_CARE_PROVIDER_SITE_OTHER): Payer: Self-pay

## 2022-11-02 ENCOUNTER — Encounter (HOSPITAL_COMMUNITY): Payer: Self-pay | Admitting: Gastroenterology

## 2022-11-02 DIAGNOSIS — K2970 Gastritis, unspecified, without bleeding: Secondary | ICD-10-CM

## 2022-11-02 DIAGNOSIS — R634 Abnormal weight loss: Secondary | ICD-10-CM

## 2022-11-06 ENCOUNTER — Telehealth (INDEPENDENT_AMBULATORY_CARE_PROVIDER_SITE_OTHER): Payer: Self-pay | Admitting: *Deleted

## 2022-11-06 DIAGNOSIS — C50911 Malignant neoplasm of unspecified site of right female breast: Secondary | ICD-10-CM | POA: Diagnosis not present

## 2022-11-06 DIAGNOSIS — M1990 Unspecified osteoarthritis, unspecified site: Secondary | ICD-10-CM | POA: Diagnosis not present

## 2022-11-06 DIAGNOSIS — E785 Hyperlipidemia, unspecified: Secondary | ICD-10-CM | POA: Diagnosis not present

## 2022-11-13 ENCOUNTER — Ambulatory Visit (HOSPITAL_COMMUNITY)
Admission: RE | Admit: 2022-11-13 | Discharge: 2022-11-13 | Disposition: A | Discharge status: Home / Self Care | Payer: Medicare Other | Source: Ambulatory Visit | Attending: Orthopedic Surgery | Admitting: Orthopedic Surgery

## 2022-11-13 DIAGNOSIS — M5416 Radiculopathy, lumbar region: Secondary | ICD-10-CM | POA: Insufficient documentation

## 2022-11-13 DIAGNOSIS — M48061 Spinal stenosis, lumbar region without neurogenic claudication: Secondary | ICD-10-CM | POA: Diagnosis not present

## 2022-11-13 DIAGNOSIS — M5116 Intervertebral disc disorders with radiculopathy, lumbar region: Secondary | ICD-10-CM | POA: Diagnosis not present

## 2022-11-13 DIAGNOSIS — M4726 Other spondylosis with radiculopathy, lumbar region: Secondary | ICD-10-CM | POA: Diagnosis not present

## 2022-11-13 DIAGNOSIS — K5730 Diverticulosis of large intestine without perforation or abscess without bleeding: Secondary | ICD-10-CM | POA: Diagnosis not present

## 2022-11-15 NOTE — Progress Notes (Signed)
Remote pacemaker transmission.   

## 2022-11-21 ENCOUNTER — Telehealth: Payer: Self-pay | Admitting: Orthopedic Surgery

## 2022-11-21 DIAGNOSIS — K2970 Gastritis, unspecified, without bleeding: Secondary | ICD-10-CM | POA: Diagnosis not present

## 2022-11-21 DIAGNOSIS — R634 Abnormal weight loss: Secondary | ICD-10-CM | POA: Diagnosis not present

## 2022-11-21 DIAGNOSIS — M5416 Radiculopathy, lumbar region: Secondary | ICD-10-CM

## 2022-11-21 DIAGNOSIS — K2990 Gastroduodenitis, unspecified, without bleeding: Secondary | ICD-10-CM | POA: Diagnosis not present

## 2022-11-21 NOTE — Telephone Encounter (Signed)
Orthopedic Telephone Note  Spoke with patient today on the phone. She is still having radiating right leg pain. Her symptoms seemed to be in an L4 or L5 distribution when I saw her in the office. I told her that her MRI did show stenosis at L4/5. She was previously interested in trying an injection. She was still interested in that treatment option today on the phone. A referral was provided to her today for that injection. I will want to see her 4 weeks after that injection to see how she is doing.   London Sheer, MD Orthopedic Surgeon

## 2022-11-23 LAB — H. PYLORI BREATH TEST: H pylori Breath Test: NEGATIVE

## 2022-11-30 NOTE — Telephone Encounter (Signed)
error 

## 2022-12-08 DIAGNOSIS — D84821 Immunodeficiency due to drugs: Secondary | ICD-10-CM | POA: Diagnosis not present

## 2022-12-08 DIAGNOSIS — Z0000 Encounter for general adult medical examination without abnormal findings: Secondary | ICD-10-CM | POA: Diagnosis not present

## 2022-12-08 DIAGNOSIS — E039 Hypothyroidism, unspecified: Secondary | ICD-10-CM | POA: Diagnosis not present

## 2022-12-08 DIAGNOSIS — R634 Abnormal weight loss: Secondary | ICD-10-CM | POA: Diagnosis not present

## 2022-12-08 DIAGNOSIS — C755 Malignant neoplasm of aortic body and other paraganglia: Secondary | ICD-10-CM | POA: Diagnosis not present

## 2022-12-08 DIAGNOSIS — R531 Weakness: Secondary | ICD-10-CM | POA: Diagnosis not present

## 2022-12-08 DIAGNOSIS — F331 Major depressive disorder, recurrent, moderate: Secondary | ICD-10-CM | POA: Diagnosis not present

## 2022-12-08 DIAGNOSIS — E782 Mixed hyperlipidemia: Secondary | ICD-10-CM | POA: Diagnosis not present

## 2022-12-08 DIAGNOSIS — E559 Vitamin D deficiency, unspecified: Secondary | ICD-10-CM | POA: Diagnosis not present

## 2022-12-11 ENCOUNTER — Other Ambulatory Visit: Payer: Self-pay

## 2022-12-11 ENCOUNTER — Ambulatory Visit: Payer: Medicare Other | Admitting: Physical Medicine and Rehabilitation

## 2022-12-11 VITALS — BP 156/76 | HR 70

## 2022-12-11 DIAGNOSIS — M5416 Radiculopathy, lumbar region: Secondary | ICD-10-CM

## 2022-12-11 MED ORDER — METHYLPREDNISOLONE ACETATE 80 MG/ML IJ SUSP
80.0000 mg | Freq: Once | INTRAMUSCULAR | Status: AC
  Administered 2022-12-11: 80 mg

## 2022-12-11 NOTE — Progress Notes (Unsigned)
[name] - 82 y.o. female MRN [id number]  Date of birth: January 23, 1941  Office Visit Note: Visit Date: 12/11/2022 PCP: Roe Rutherford, NP Referred by: London Sheer, MD  Subjective: Chief Complaint  Patient presents with  . Lower Back - Pain   HPI:  [name] is a 82 y.o. female who comes in today at the request of Dr. Willia Craze for planned Right L4-5 Lumbar Transforaminal epidural steroid injection with fluoroscopic guidance.  The patient has failed conservative care including home exercise, medications, time and activity modification.  This injection will be diagnostic and hopefully therapeutic.  Please see requesting physician notes for further details and justification.  ROS Otherwise per HPI.  Assessment & Plan: Visit Diagnoses:    ICD-10-CM   1. Lumbar radiculopathy  M54.16 XR C-ARM NO REPORT    Epidural Steroid injection    methylPREDNISolone acetate (DEPO-MEDROL) injection 80 mg      Plan: No additional findings.   Meds & Orders:  Meds ordered this encounter  Medications  . methylPREDNISolone acetate (DEPO-MEDROL) injection 80 mg    Orders Placed This Encounter  Procedures  . XR C-ARM NO REPORT  . Epidural Steroid injection    Follow-up: Return for visit to requesting provider as needed.   Procedures: No procedures performed  Lumbosacral Transforaminal Epidural Steroid Injection - Sub-Pedicular Approach with Fluoroscopic Guidance  Patient: [name]      Date of Birth: 10-18-1940 MRN: [id number] PCP: Roe Rutherford, NP      Visit Date: 12/11/2022   Universal Protocol:    Date/Time: 12/11/2022  Consent Given By: the patient  Position: PRONE  Additional Comments: Vital signs were monitored before and after the procedure. Patient was prepped and draped in the usual sterile fashion. The correct patient, procedure, and site was verified.   Injection Procedure Details:   Procedure diagnoses: Lumbar radiculopathy [M54.16]    Meds  Administered:  Meds ordered this encounter  Medications  . methylPREDNISolone acetate (DEPO-MEDROL) injection 80 mg    Laterality: Right  Location/Site: L4  Needle:5.0 in., 22 ga.  Short bevel or Quincke spinal needle  Needle Placement: Transforaminal  Findings:    -Comments: Excellent flow of contrast along the nerve, nerve root and into the epidural space.  Procedure Details: After squaring off the end-plates to get a true AP view, the C-arm was positioned so that an oblique view of the foramen as noted above was visualized. The target area is just inferior to the "nose of the scotty dog" or sub pedicular. The soft tissues overlying this structure were infiltrated with 2-3 ml. of 1% Lidocaine without Epinephrine.  The spinal needle was inserted toward the target using a "trajectory" view along the fluoroscope beam.  Under AP and lateral visualization, the needle was advanced so it did not puncture dura and was located close the 6 O'Clock position of the pedical in AP tracterory. Biplanar projections were used to confirm position. Aspiration was confirmed to be negative for CSF and/or blood. A 1-2 ml. volume of Isovue-250 was injected and flow of contrast was noted at each level. Radiographs were obtained for documentation purposes.   After attaining the desired flow of contrast documented above, a 0.5 to 1.0 ml test dose of 0.25% Marcaine was injected into each respective transforaminal space.  The patient was observed for 90 seconds post injection.  After no sensory deficits were reported, and normal lower extremity motor function was noted,   the above injectate was administered so  that equal amounts of the injectate were placed at each foramen (level) into the transforaminal epidural space.   Additional Comments:  The patient tolerated the procedure well Dressing: 2 x 2 sterile gauze and Band-Aid    Post-procedure details: Patient was observed during the  procedure. Post-procedure instructions were reviewed.  Patient left the clinic in stable condition.    Clinical History: EXAM: MRI LUMBAR SPINE WITHOUT AND WITH CONTRAST   TECHNIQUE: Multiplanar and multiecho pulse sequences of the lumbar spine were obtained without and with intravenous contrast.   CONTRAST:  8mL GADAVIST GADOBUTROL 1 MMOL/ML IV SOLN   COMPARISON:  Radiographs 12/04/2019   FINDINGS: Segmentation: There are five lumbar type vertebral bodies. The last full intervertebral disc space is labeled L5-S1. This correlates with the prior lumbar radiographs.   Alignment: Significant left convex rotatory scoliotic deformity of the lumbar spine with associated advanced multilevel degenerative disc disease and facet disease.   Vertebrae:  No bone lesions or fractures.   Conus medullaris and cauda equina: Conus extends to the L1 level. Conus and cauda equina appear normal.   Paraspinal and other soft tissues: No significant paraspinal or retroperitoneal findings.   Disc levels:   L1-2: Advanced degenerative disc disease and facet disease but no disc protrusion or significant spinal stenosis. There is mild bilateral lateral recess stenosis and mild bilateral foraminal encroachment, left greater than right.   L2-3: Advanced degenerative disc disease and facet disease. There is a bulging uncovered disc and a central disc osteophyte complex. This in combination with significant right-sided facet disease contributes to significant right lateral recess stenosis likely effecting the right L3 nerve root. Mild right foraminal encroachment also. The left neural foramen is widely patent.   L3-4: Mild bulging annulus and moderate facet disease with ligamentum flavum thickening contributing to moderate bilateral lateral recess stenosis and mild bilateral foraminal encroachment. There is also mild/early spinal stenosis.   L4-5: Diffuse bulging annulus, osteophytic ridging,  short pedicles, severe facet disease and ligamentum flavum thickening contributing to moderately severe spinal and bilateral lateral recess stenosis. No significant foraminal stenosis. Mild foraminal encroachment noted on the left.   L5-S1: Advanced facet disease, left greater than right but no disc protrusion, spinal or foraminal stenosis.   IMPRESSION: 1. Advanced degenerative lumbar spondylosis with multilevel disc disease and facet disease. 2. Moderately severe multifactorial spinal and bilateral lateral recess stenosis at L4-5. 3. Mild spinal stenosis and moderate bilateral lateral recess stenosis at L3-4. 4. Significant right lateral recess stenosis at L2-3 likely affecting the right L3 nerve root. 5. Mild bilateral lateral recess stenosis and mild bilateral foraminal encroachment at L1-2, left greater than right.     Electronically Signed   By: Rudie Meyer M.D.   On: 12/22/2019 15:14     Objective:  VS:  HT:    WT:   BMI:     BP:(!) 156/76  HR:70bpm  TEMP: ( )  RESP:  Physical Exam Vitals and nursing note reviewed.  Constitutional:      General: She is not in acute distress.    Appearance: Normal appearance. She is not ill-appearing.  HENT:     Head: Normocephalic and atraumatic.     Right Ear: External ear normal.     Left Ear: External ear normal.  Eyes:     Extraocular Movements: Extraocular movements intact.  Cardiovascular:     Rate and Rhythm: Normal rate.     Pulses: Normal pulses.  Pulmonary:     Effort: Pulmonary effort  is normal. No respiratory distress.  Abdominal:     General: There is no distension.     Palpations: Abdomen is soft.  Musculoskeletal:        General: Tenderness present.     Cervical back: Neck supple.     Right lower leg: No edema.     Left lower leg: No edema.     Comments: Patient has good distal strength with no pain over the greater trochanters.  No clonus or focal weakness.  Skin:    Findings: No erythema, lesion  or rash.  Neurological:     General: No focal deficit present.     Mental Status: She is alert and oriented to person, place, and time.     Sensory: No sensory deficit.     Motor: No weakness or abnormal muscle tone.     Coordination: Coordination normal.  Psychiatric:        Mood and Affect: Mood normal.        Behavior: Behavior normal.     Imaging: No results found.

## 2022-12-11 NOTE — Procedures (Unsigned)
Lumbosacral Transforaminal Epidural Steroid Injection - Sub-Pedicular Approach with Fluoroscopic Guidance  Patient: [name]      Date of Birth: [birth year] MRN: [id number] PCP: Roe Rutherford, NP      Visit Date: 12/11/2022   Universal Protocol:    Date/Time: 12/11/2022  Consent Given By: the patient  Position: PRONE  Additional Comments: Vital signs were monitored before and after the procedure. Patient was prepped and draped in the usual sterile fashion. The correct patient, procedure, and site was verified.   Injection Procedure Details:   Procedure diagnoses: Lumbar radiculopathy [M54.16]    Meds Administered:  Meds ordered this encounter  Medications   methylPREDNISolone acetate (DEPO-MEDROL) injection 80 mg    Laterality: Right  Location/Site: L4  Needle:5.0 in., 22 ga.  Short bevel or Quincke spinal needle  Needle Placement: Transforaminal  Findings:    -Comments: Excellent flow of contrast along the nerve, nerve root and into the epidural space.  Procedure Details: After squaring off the end-plates to get a true AP view, the C-arm was positioned so that an oblique view of the foramen as noted above was visualized. The target area is just inferior to the "nose of the scotty dog" or sub pedicular. The soft tissues overlying this structure were infiltrated with 2-3 ml. of 1% Lidocaine without Epinephrine.  The spinal needle was inserted toward the target using a "trajectory" view along the fluoroscope beam.  Under AP and lateral visualization, the needle was advanced so it did not puncture dura and was located close the 6 O'Clock position of the pedical in AP tracterory. Biplanar projections were used to confirm position. Aspiration was confirmed to be negative for CSF and/or blood. A 1-2 ml. volume of Isovue-250 was injected and flow of contrast was noted at each level. Radiographs were obtained for documentation purposes.   After attaining the desired flow  of contrast documented above, a 0.5 to 1.0 ml test dose of 0.25% Marcaine was injected into each respective transforaminal space.  The patient was observed for 90 seconds post injection.  After no sensory deficits were reported, and normal lower extremity motor function was noted,   the above injectate was administered so that equal amounts of the injectate were placed at each foramen (level) into the transforaminal epidural space.   Additional Comments:  The patient tolerated the procedure well Dressing: 2 x 2 sterile gauze and Band-Aid    Post-procedure details: Patient was observed during the procedure. Post-procedure instructions were reviewed.  Patient left the clinic in stable condition.

## 2022-12-11 NOTE — Patient Instructions (Signed)

## 2022-12-11 NOTE — Progress Notes (Unsigned)
Functional Pain Scale - descriptive words and definitions  Unmanageable (7)  Pain interferes with normal ADL's/nothing seems to help/sleep is very difficult/active distractions are very difficult to concentrate on. Severe range order  Average Pain 7   +Driver, -BT, -Dye Allergies.  Lower back pain on right side that radiates into the right leg to the shin

## 2022-12-14 ENCOUNTER — Ambulatory Visit (HOSPITAL_COMMUNITY): Payer: Medicare Other

## 2022-12-20 ENCOUNTER — Encounter: Payer: Self-pay | Admitting: Neurology

## 2022-12-20 DIAGNOSIS — K838 Other specified diseases of biliary tract: Secondary | ICD-10-CM | POA: Diagnosis not present

## 2022-12-20 DIAGNOSIS — D1771 Benign lipomatous neoplasm of kidney: Secondary | ICD-10-CM | POA: Diagnosis not present

## 2022-12-20 DIAGNOSIS — R918 Other nonspecific abnormal finding of lung field: Secondary | ICD-10-CM | POA: Diagnosis not present

## 2022-12-20 DIAGNOSIS — K5730 Diverticulosis of large intestine without perforation or abscess without bleeding: Secondary | ICD-10-CM | POA: Diagnosis not present

## 2022-12-20 DIAGNOSIS — E041 Nontoxic single thyroid nodule: Secondary | ICD-10-CM | POA: Diagnosis not present

## 2022-12-20 DIAGNOSIS — I2510 Atherosclerotic heart disease of native coronary artery without angina pectoris: Secondary | ICD-10-CM | POA: Diagnosis not present

## 2022-12-20 DIAGNOSIS — R140 Abdominal distension (gaseous): Secondary | ICD-10-CM | POA: Diagnosis not present

## 2022-12-28 ENCOUNTER — Encounter: Payer: Self-pay | Admitting: Neurology

## 2023-01-02 ENCOUNTER — Telehealth: Payer: Self-pay | Admitting: Neurology

## 2023-01-02 NOTE — Telephone Encounter (Signed)
At 4:16 pm on yesterday [facility] [place] left a vm re: pt's mycophenolate (CELLCEPT) 500 MG tablet.  She stated the pharmacy is rejecting .  This needs to be reviewed to be covered under either part d pharmacy or part b medical benefit. If provider wants to start a review that can be done by the online portal or by calling [phone] option 5

## 2023-01-02 NOTE — Telephone Encounter (Signed)
Returned to call to [employer], spoke with Darral Dash B. She is stating medications needs a PA.Request sent to PA team.

## 2023-01-03 ENCOUNTER — Telehealth: Payer: Self-pay

## 2023-01-03 ENCOUNTER — Other Ambulatory Visit (HOSPITAL_COMMUNITY): Payer: Self-pay

## 2023-01-03 DIAGNOSIS — E042 Nontoxic multinodular goiter: Secondary | ICD-10-CM | POA: Diagnosis not present

## 2023-01-03 NOTE — Telephone Encounter (Signed)
Pharmacy Patient Advocate Encounter   Received notification from Physician's Office that prior authorization for Mycophenolate Mofetil 500MG  tablets is required/requested.   Insurance verification completed.   The patient is insured through [facility] .   Per test claim: PA required; PA started via CoverMyMeds. KEY [id number] . Waiting for clinical questions to populate.

## 2023-01-03 NOTE — Telephone Encounter (Signed)
PA request has been Submitted. New Encounter created for follow up. For additional info see Pharmacy Prior Auth telephone encounter from 01/03/2023.

## 2023-01-08 ENCOUNTER — Telehealth: Payer: Self-pay | Admitting: Cardiology

## 2023-01-08 DIAGNOSIS — I2510 Atherosclerotic heart disease of native coronary artery without angina pectoris: Secondary | ICD-10-CM | POA: Insufficient documentation

## 2023-01-08 NOTE — Telephone Encounter (Signed)
Patient's daughter called stating her mother would like to stay in the [place] office with Dr. Anne Fu, because of trouble she has had in the past at the [place] office.

## 2023-01-08 NOTE — Progress Notes (Unsigned)
Cardiology Office Note:    Date:  01/09/2023  ID:  [name], DOB [birth year], MRN [id number] PCP: Roe Rutherford, NP  [facility] Providers Cardiologist:  Donato Schultz, MD       Patient Profile:      (HFpEF) heart failure with preserved ejection fraction  TTE 08/08/21: EF 60-65, no RWMA, mild LVH, NL RVSF, NL PASP, RVSP 31.5, severe LAE, mild RAE, mild MR, AV sclerosis, RAP 3  Paroxysmal atrial fibrillation  Coronary artery disease  Myoview 06/23/16: EF 59, low risk  LHC 11/18/18: D1 40, mean PA 13, mean PCWP 2 Symptomatic bradycardia S/p pacemaker  Hypertension  Hyperlipidemia  Orthostatic hypotension R Breast CA Hypothyroidism Myasthenia gravis          History of Present Illness:  Discussed the use of AI scribe software for clinical note transcription with the patient, who gave verbal consent to proceed.  [name] is a 82 y.o. female who returns for follow up of AFib, CHF, CAD. She was last seen by Dr. Anne Fu 10/18/21.   She is here with her daughter. The patient presented with a complaint of chest pain, which she has been experiencing for over a year. The pain is located to the left of the sternum and is typically noticed during periods of rest. The patient has been attributing this discomfort to arthritis rather than a cardiac origin. The patient has been very active, engaging in housework, gardening, and walking, with the ability to walk over 12,000 steps. The chest discomfort does not seem to be exacerbated by these activities. The patient has also experienced some lower extremity edema recently, which she managed with Lasix as needed. The patient reported a weight gain from 133 to 142 pounds over the past few months, which she attributes to an improved appetite and dietary changes rather than fluid accumulation.      Review of Systems  Constitutional: Positive for decreased appetite.  Gastrointestinal:  Negative for hematochezia and melena.  Genitourinary:   Negative for hematuria.  See HPI     Studies Reviewed:        Results   LABS - Chart Review Creatinine: 0.98 mg/dL (25/36/6440) Potassium: 4.1 mmol/L (12/08/2022) ALT: 15 U/L (12/08/2022) Total Cholesterol: 160 mg/dL (34/74/2595) Triglycerides: 83 mg/dL (63/87/5643) HDL: 62 mg/dL (32/95/1884) LDL: 82 mg/dL (16/60/6301) Hemoglobin: 12.9 g/dL (60/12/9321)      Risk Assessment/Calculations:    CHA2DS2-VASc Score = 5   This indicates a 7.2% annual risk of stroke. The patient's score is based upon: CHF History: 1 HTN History: 1 Diabetes History: 0 Stroke History: 0 Vascular Disease History: 0 Age Score: 2 Gender Score: 1            Physical Exam:   VS:  BP (!) 110/52   Pulse 66   Ht 5\' 3"  (1.6 m)   Wt 142 lb 9.6 oz (64.7 kg)   SpO2 95%   BMI 25.26 kg/m    Wt Readings from Last 3 Encounters:  01/09/23 142 lb 9.6 oz (64.7 kg)  10/31/22 139 lb 15.9 oz (63.5 kg)  10/27/22 139 lb 15.9 oz (63.5 kg)    Constitutional:      Appearance: Healthy appearance. Not in distress.  Neck:     Vascular: No JVR. JVD normal.  Pulmonary:     Breath sounds: Normal breath sounds. No wheezing. No rales.  Cardiovascular:     Normal rate. Regular rhythm.     Murmurs: There is no murmur.  Edema:  Peripheral edema present.    Ankle: bilateral trace edema of the ankle. Abdominal:     Palpations: Abdomen is soft.      Assessment and Plan:  Heart Failure with Preserved Ejection Fraction Recent lower extremity edema. No shortness of breath. Echocardiogram from May 2023 showed EF 60-65%. Volume status appears stable. NYHA II. -Continue Lasix 20mg  daily as needed for lower extremity edema. -Advised daily weight monitoring and to take Lasix if weight increases by more than 3 pounds in a day.  Paroxysmal Atrial Fibrillation Maintaining sinus rhythm on exam. EKG from August showed atrial paced rhythm with no ischemic changes. Eliquis was adjusted due to weight loss. She has increased  weight recently -Continue Eliquis 2.5mg  twice daily.  -If weight continues to increase and remains above 60kg, consider increasing Eliquis back to 5mg  twice daily.  Symptomatic Bradycardia Status Post Pacemaker Follow up with EP as planned.  Coronary Artery Disease Cardiac catheterization in 2020 showed mild stenosis in D1. Recent left-sided chest discomfort, non-cardiac in nature. -Continue watchful waiting. -If symptoms worsen, consider stress testing.  Hyperlipidemia Recent LDL optimal. -Continue Lipitor 20mg  daily.  Hypertension Blood pressure controlled. -Continue Atenolol 25mg  daily.           Dispo:  Return in about 3 months (around 04/11/2023) for Routine Follow Up, w/ Dr. Anne Fu, or Tereso Newcomer, PA-C.  Signed, Tereso Newcomer, PA-C

## 2023-01-08 NOTE — Telephone Encounter (Signed)
Pt daughter called in requesting a switch to Dr. Eden Emms because they prefer to stay in [place]. Is this switch okay?

## 2023-01-08 NOTE — Telephone Encounter (Signed)
Dr. Wyline Mood will you be willing to see this pt? They prefer a female provider.

## 2023-01-09 ENCOUNTER — Ambulatory Visit: Payer: Medicare Other | Attending: Physician Assistant | Admitting: Physician Assistant

## 2023-01-09 ENCOUNTER — Encounter: Payer: Self-pay | Admitting: Physician Assistant

## 2023-01-09 VITALS — BP 110/52 | HR 66 | Ht 63.0 in | Wt 142.6 lb

## 2023-01-09 DIAGNOSIS — I480 Paroxysmal atrial fibrillation: Secondary | ICD-10-CM | POA: Diagnosis not present

## 2023-01-09 DIAGNOSIS — I495 Sick sinus syndrome: Secondary | ICD-10-CM

## 2023-01-09 DIAGNOSIS — I2510 Atherosclerotic heart disease of native coronary artery without angina pectoris: Secondary | ICD-10-CM | POA: Diagnosis not present

## 2023-01-09 DIAGNOSIS — I5032 Chronic diastolic (congestive) heart failure: Secondary | ICD-10-CM | POA: Diagnosis not present

## 2023-01-09 DIAGNOSIS — I10 Essential (primary) hypertension: Secondary | ICD-10-CM

## 2023-01-09 DIAGNOSIS — E7800 Pure hypercholesterolemia, unspecified: Secondary | ICD-10-CM

## 2023-01-09 NOTE — Patient Instructions (Signed)
Medication Instructions:  Your physician recommends that you continue on your current medications as directed. Please refer to the Current Medication list given to you today.  *If you need a refill on your cardiac medications before your next appointment, please call your pharmacy*   Lab Work: None ordered  If you have labs (blood work) drawn today and your tests are completely normal, you will receive your results only by: MyChart Message (if you have MyChart) OR A paper copy in the mail If you have any lab test that is abnormal or we need to change your treatment, we will call you to review the results.   Testing/Procedures: None ordered   Follow-Up: At [facility], you and your health needs are our priority.  As part of our continuing mission to provide you with exceptional heart care, we have created designated Provider Care Teams.  These Care Teams include your primary Cardiologist (physician) and Advanced Practice Providers (APPs -  Physician Assistants and Nurse Practitioners) who all work together to provide you with the care you need, when you need it.  We recommend signing up for the patient portal called "MyChart".  Sign up information is provided on this After Visit Summary.  MyChart is used to connect with patients for Virtual Visits (Telemedicine).  Patients are able to view lab/test results, encounter notes, upcoming appointments, etc.  Non-urgent messages can be sent to your provider as well.   To learn more about what you can do with MyChart, go to [email or web address].    Your next appointment:   3 month(s)  Provider:   Donato Schultz, MD     Other Instructions

## 2023-01-11 ENCOUNTER — Encounter (INDEPENDENT_AMBULATORY_CARE_PROVIDER_SITE_OTHER): Payer: Self-pay | Admitting: Gastroenterology

## 2023-01-11 ENCOUNTER — Ambulatory Visit (INDEPENDENT_AMBULATORY_CARE_PROVIDER_SITE_OTHER): Payer: Medicare Other | Admitting: Gastroenterology

## 2023-01-11 VITALS — BP 119/75 | HR 65 | Temp 98.2°F | Ht 63.0 in | Wt 141.1 lb

## 2023-01-11 DIAGNOSIS — R1031 Right lower quadrant pain: Secondary | ICD-10-CM | POA: Diagnosis not present

## 2023-01-11 DIAGNOSIS — K219 Gastro-esophageal reflux disease without esophagitis: Secondary | ICD-10-CM

## 2023-01-11 DIAGNOSIS — K5900 Constipation, unspecified: Secondary | ICD-10-CM | POA: Diagnosis not present

## 2023-01-11 NOTE — Progress Notes (Addendum)
Referring Provider: Roe Rutherford, NP Primary Care Physician:  Roe Rutherford, NP Primary GI Physician:   Chief Complaint  Patient presents with   Follow-up    Follow up after TCS and EGD. Having some chest pressure. States its been going for a long time. States it does not seem to be related to eating. States she saw cardiologist and offered stress test but states he told he really did not think she needed it. Having pain in lower abdomen. States for a long time. States appetite is not good. Eats twice a day. Swelling in lower legs and feet.    HPI:   [name] is a 82 y.o. female with past medical history of GERD, atrial fibrillation, paraganglioma, GERD, IBS, hypothyroidism, breast cancer, obesity, myasthenia gravis, hyperlipidemia, IBS and GERD   Patient presenting today for constipation, abdominal pain and GERD   Last seen November 2023, at that time taking half a capful of MiraLAX 1-2 times per day which makes her move her bowels daily.  Having some occasional mid chest pain.  Taking lansoprazole every other day 30 mg but occasionally taking lansoprazole as needed for chest pain which helps.  Reports decreased appetite.  She was recommended to increase Prevacid to 30 mg every day, schedule EGD and colonoscopy, liberalize diet, continue MiraLAX 1-2 half capfuls per day.  H Pylori breath test in September was negative.   Present: She is taking 1/2 capful miralax BID and has easy to pass BM daily with this.   She states appetite is not great. She notes initially when she began losing weight she was trying to but then she continued to lose weight without trying. Weight has been stable for the past 6 months. TSH and T4 was normal in September. She notes that she does not feel that foods smell or taste good and she often states she forgets to eat. Daughter reports when she cooks for her she does eat good.    She has gained some weight recently but is also having some swelling in  her legs and feet, but notes she has been doing a lot of work around her house so has been up on her feet more than normal. She start 20mg  lasix daily as ordered by her PCP.   She notes some pain in left side of her pressure, this occurs occasionally. she had workup by cardiology but was told everything looked okay by them. She notes a few times recently she has bent over fairly soon after eating and had some food regurgitation into her throat. Chest pressure does not seem to be related to eating. She denies any belching but is passing a lot of flatulence. She does eat a lot of cooked vegetables. She is taking prevacid 30mg  BID.  She notes some lower abdominal pain that occurs most days. She notes a lot of rumbling in her stomach as well. Sometimes passing gas or having a BM helps with her pain. Eating does not bring on her pain. Denies blood in stools or black stools.   Last Colonoscopy:10/2022 - Two 3 to 4 mm polyps in the transverse colon,                            removed with a cold snare. Resected and retrieved.                           - One 2  mm polyp in the rectum, removed with a cold                            biopsy forceps. Resected and retrieved.                           - Diverticulosis in the sigmoid colon, in the                            descending colon and in the transverse colon.                           - Non-bleeding internal hemorrhoids.  Last Endoscopy:10/2022 - Normal esophagus.                           - Gastritis. Biopsied.                           - Normal examined duodenum. Pathology showed presence of normal gastric biopsies with negative H. pylori staining or dysplasia, 1 polyp was inflammatory and 2 polyps were lymphoid aggregates, repeat colonoscopy is not warranted given age.   Recommendations:    Past Medical History:  Diagnosis Date   Anemia    Atrial fibrillation (HCC)    Breast cancer (HCC) 09/2019   right breast    Chronic back pain    Chronic  nausea    Complication of anesthesia    hypotension, elevated heart rate and oxygenation desaturation   Depression with anxiety    Diverticulosis    Dyspnea    due to myasthenia gravis   Essential hypertension    GERD (gastroesophageal reflux disease)    History of pneumonia    Hypothyroidism    IBS (irritable colon syndrome)    LVH (left ventricular hypertrophy)    Migraines    Mixed hyperlipidemia    Myasthenia gravis (HCC)    Obesity    Ocular myasthenia gravis (HCC)    Osteoarthritis of knee    Pacemaker    PAF (paroxysmal atrial fibrillation) (HCC)    chads2vasc score is at least 4   Paraganglioma (HCC)    Resection 02/2012 (retroperitoneal)   Paraganglioma, malignant (HCC) 11/06/2015   Presence of permanent cardiac pacemaker 07/18/2017   Renal insufficiency    Skin cancer     Past Surgical History:  Procedure Laterality Date   ABDOMINAL HYSTERECTOMY     BIOPSY  10/31/2022   Procedure: BIOPSY;  Surgeon: Dolores Frame, MD;  [facility]: [facility];  Service: Gastroenterology;;   BREAST BIOPSY Right 09/2019   BREAST LUMPECTOMY Right 10/28/2019   CARDIAC CATHETERIZATION     CHOLECYSTECTOMY     COLONOSCOPY  08/24/2011   Procedure: COLONOSCOPY;  Surgeon: Malissa Hippo, MD;  [facility]: [facility];  Service: Endoscopy;  Laterality: N/A;  1200   COLONOSCOPY WITH PROPOFOL N/A 10/31/2022   Procedure: COLONOSCOPY WITH PROPOFOL;  Surgeon: Dolores Frame, MD;  [facility]: [facility];  Service: Gastroenterology;  Laterality: N/A;  9:45AM;ASA 3   CT guided biopsy  01/16/12   ESOPHAGOGASTRODUODENOSCOPY (EGD) WITH PROPOFOL N/A 10/31/2022   Procedure: ESOPHAGOGASTRODUODENOSCOPY (EGD) WITH PROPOFOL;  Surgeon: Dolores Frame, MD;  [facility];  Service: Gastroenterology;  Laterality: N/A;  9:45AM;ASA 3   GIVENS CAPSULE STUDY  01/29/2012   Procedure: GIVENS CAPSULE STUDY;  Surgeon: Malissa Hippo, MD;  [facility]: [facility];  Service:  Endoscopy;  Laterality: N/A;  730   KNEE SURGERY     LAPAROTOMY  03/01/2012   Procedure: EXPLORATORY LAPAROTOMY;  Surgeon: Velora Heckler, MD;  Location: [facility];  Service: General;  Laterality: N/A;  Exploratory Laparotomy ,Resection Retroperitoneal Mass   MASTECTOMY, PARTIAL Right 10/28/2019   Procedure: RIGHT BREAST PARTIAL MASTECTOMY;  Surgeon: Darnell Level, MD;  Location: [facility];  Service: General;  Laterality: Right;   NASAL SINUS SURGERY     30 yrs ago   PACEMAKER IMPLANT N/A 07/18/2017   Procedure: PACEMAKER IMPLANT;  Surgeon: Marinus Maw, MD;  [facility]: [facility];  Service: Cardiovascular;  Laterality: N/A;   POLYPECTOMY  10/31/2022   Procedure: POLYPECTOMY;  Surgeon: Dolores Frame, MD;  [facility];  Service: Gastroenterology;;   RIGHT/LEFT HEART CATH AND CORONARY ANGIOGRAPHY N/A 11/18/2018   Procedure: RIGHT/LEFT HEART CATH AND CORONARY ANGIOGRAPHY;  Surgeon: Lyn Records, MD;  Location: [facility];  Service: Cardiovascular;  Laterality: N/A;   TOTAL KNEE ARTHROPLASTY Bilateral    2005 and 2011    Current Outpatient Medications  Medication Sig Dispense Refill   ALPRAZolam (XANAX) 0.5 MG tablet Take 1/4 or .25 mg by mouth as needed at bedtime     anastrozole (ARIMIDEX) 1 MG tablet TAKE (1) TABLET BY MOUTH ONCE DAILY. 90 tablet 0   apixaban (ELIQUIS) 2.5 MG TABS tablet Take 2.5 mg by mouth 2 (two) times daily.     Artificial Tear Solution (SOOTHE XP OP) Place 1 drop into both eyes 4 (four) times daily.     atenolol (TENORMIN) 25 MG tablet TAKE (1) TABLET BY MOUTH ONCE DAILY. MAY TAKE AN ADDITIONAL TABLET IF SYSTOLIC OVER 150 OR SEVERE PALPITATIONS. 100 tablet 0   atorvastatin (LIPITOR) 20 MG tablet Take 20 mg by mouth daily.     B Complex-C (B-COMPLEX WITH VITAMIN C) tablet Take 1 tablet by mouth daily with supper.      baclofen (LIORESAL) 10 MG tablet Take 0.5 tablets (5 mg total) by mouth at bedtime as needed for muscle  spasms. 15 each 3   Coenzyme Q10 (COQ10) 100 MG CAPS Take 100 mg by mouth daily.      diclofenac sodium (VOLTAREN) 1 % GEL Apply 4 g topically 4 (four) times daily as needed (for pain).      DULoxetine (CYMBALTA) 30 MG capsule Take 30 mg by mouth daily.     furosemide (LASIX) 20 MG tablet Take 1 tablet (20 mg total) by mouth as needed. 30 tablet 0   gabapentin (NEURONTIN) 300 MG capsule Take 1 capsule (300 mg total) by mouth 3 (three) times daily.     lansoprazole (PREVACID) 30 MG capsule Take 1 capsule (30 mg total) by mouth 2 (two) times daily before a meal. TAKE (1) CAPSULE BY MOUTH TWICE DAILY. 180 capsule 3   levothyroxine (SYNTHROID) 100 MCG tablet Take 100 mcg by mouth daily.     lidocaine (LIDODERM) 5 % Place 1-3 patches onto the skin daily as needed (for pain). Remove & Discard patch within 12 hours or as directed by MD for pain     mycophenolate (CELLCEPT) 500 MG tablet Take 1 tablet (500 mg total) by mouth 2 (two) times daily. 180 tablet 3   OVER THE COUNTER MEDICATION Calcium and vit  D one bid     oxyCODONE-acetaminophen (PERCOCET) 10-325 MG tablet Take 1 tablet by mouth every 4 (four) hours as needed (pain).     polyethylene glycol powder (GLYCOLAX/MIRALAX) 17 GM/SCOOP powder Take 8.5 g by mouth daily.     potassium chloride SA (KLOR-CON M) 20 MEQ tablet Take 20 mEq by mouth as needed (with the lasix).     predniSONE (DELTASONE) 5 MG tablet Take 1 tablet daily, will use the 1 mg tablets in addition to taper by 1 mg monthly until down to 5 mg and stay on this dose 90 tablet 1   pyridostigmine (MESTINON) 60 MG tablet Take 0.5 tablets (30 mg total) by mouth 4 (four) times daily. 180 tablet 3   SUMAtriptan (IMITREX) 50 MG tablet Take 50 mg by mouth every 2 (two) hours as needed. For migraines     Terbinafine 1 % GEL Apply 1 application topically 2 (two) times daily as needed (toe nail fungus).      traZODone (DESYREL) 50 MG tablet TAKE (1) TABLET BY MOUTH AT BEDTIME. 90 tablet 2   No  current facility-administered medications for this visit.    Allergies as of 01/11/2023 - Review Complete 01/11/2023  Allergen Reaction Noted   Nitrofurantoin macrocrystal Nausea And Vomiting 12/06/2011   Ampicillin Rash 11/16/2010   Avocado Diarrhea and Nausea And Vomiting 04/05/2014   Biaxin [clarithromycin] Rash 10/19/2010   Doxycycline monohydrate Nausea And Vomiting 02/09/2015   Moxifloxacin  07/12/2017   Sensi-care protective barrier [petrolatum-zinc oxide] Rash 04/06/2014   Tetracyclines & related Nausea And Vomiting 10/19/2010    Family History  Problem Relation Age of Onset   Inflammatory bowel disease Maternal Grandmother    Sleep apnea Daughter    Sleep apnea Daughter    Sleep apnea Son     Social History   Socioeconomic History   Marital status: Widowed    Spouse name: Not on file   Number of children: Not on file   Years of education: Not on file   Highest education level: Not on file  Occupational History   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  Retired Administrator, arts.   Caffeine use: few ounces of coke about a every day, hot tea    Right handed   Social Determinants of Health   Financial Resource Strain: Low Risk  (12/08/2022)   Received from [facility]   Overall Financial Resource Strain (CARDIA)    Difficulty of Paying Living Expenses: Not very hard  Food Insecurity: No Food Insecurity (12/08/2022)   Received from [facility]   Hunger Vital Sign    Worried About Running Out of Food in the Last Year: Never true    Ran Out of Food in the Last Year: Never true  Transportation Needs: No  Transportation Needs (12/08/2022)   Received from [facility] - Transportation    Lack of Transportation (Medical): No    Lack of Transportation (Non-Medical): No  Physical Activity: Sufficiently Active (12/08/2022)   Received from [facility]   Exercise Vital  Sign    Days of Exercise per Week: 5 days    Minutes of Exercise per Session: 30 min  Stress: No Stress Concern Present (12/08/2022)   Received from [facility] - Occupational Stress Questionnaire    Feeling of Stress : Not at all  Social Connections: Moderately Integrated (12/08/2022)   Received from [facility]   Social Network    How would you rate your social network (family, work, friends)?: Adequate participation with social networks    Review of systems General: negative for malaise, night sweats, fever, chills, weight los Neck: Negative for lumps, goiter, pain and significant neck swelling Resp: Negative for cough, wheezing, dyspnea at rest CV: Negative for chest pain, leg swelling, palpitations, orthopnea GI: denies melena, hematochezia, nausea, vomiting, diarrhea, constipation, dysphagia, odyonophagia, early satiety or unintentional weight loss.  MSK: Negative for joint pain or swelling, back pain, and muscle pain. Derm: Negative for itching or rash Psych: Denies depression, anxiety, memory loss, confusion. No homicidal or suicidal ideation.  Heme: Negative for prolonged bleeding, bruising easily, and swollen nodes. Endocrine: Negative for cold or heat intolerance, polyuria, polydipsia and goiter. Neuro: negative for tremor, gait imbalance, syncope and seizures. The remainder of the review of systems is noncontributory.  Physical Exam: BP 119/75 (BP Location: Left Arm, Patient Position: Sitting, Cuff Size: Large)   Pulse 65   Temp 98.2 F (36.8 C) (Oral)   Ht 5\' 3"  (1.6 m)   Wt 141 lb 1.6 oz (64 kg)   BMI 24.99 kg/m  General:   Alert and oriented. No  distress noted. Pleasant and cooperative.  Head:  Normocephalic and atraumatic. Eyes:  Conjuctiva clear without scleral icterus. Mouth:  Oral mucosa pink and moist. Good dentition. No lesions. Heart: Normal rate and rhythm, s1 and s2 heart sounds present.  Lungs: Clear lung sounds in all lobes. Respirations equal and unlabored. Abdomen:  +BS, soft, and non-distended. TTP of RLQ. No rebound or guarding. No HSM or masses noted. Derm: No palmar erythema or jaundice Msk:  Symmetrical without gross deformities. Normal posture. Extremities:  Without edema. Neurologic:  Alert and  oriented x4 Psych:  Alert and cooperative. Normal mood and affect.  Invalid input(s): "6 MONTHS"   ASSESSMENT: ATALEE CALIP is a 82 y.o. female presenting today for constipation, abdominal pain and GERD  GERD/chest pressure: occasional left sided chest pressure. Negative workup by cards. Some regurgitation of food if bending over too soon after eating. Notes a lot of gas. She is taking prevacid 30mg  BID. Query if chest pressure is secondary to gas build up. Encouraged to try phazyme or gas x to see if this helps. Will continue with PPI BID and good reflux precautions.  Constipation:well managed taking 1/2 capful miralax BID, having 1 BM per day without need to strain.  Recommend continuing with current regimen.   Abdominal pain: lower abdominal pain occurring most everyday, sometimes having a BM or passing gas helps pain. No post prandial pain. Weight is stable. Denies diarrhea, rectal bleeding or melena. Suspect symptoms are likely related to her IBS, however, abdomen is TTP of RLQ on abdominal exam. Will proceed with CT A/P with contrast for further evaluation as I cannot rule out appendicitis, diverticulitis.    PLAN:  Continue with lansoprazole 30 mg BID 2. Can try phazyme or gas x  3. Start Daily probiotic 4. Continue miralax 1/2 capful BID  5. CT A/P with contrast  6. Good reflux precautions  All  questions  were answered, patient verbalized understanding and is in agreement with plan as outlined above.   Follow Up: 4 months   Pamela Helbling L. Jeanmarie Hubert, MSN, APRN, AGNP-C Adult-Gerontology Nurse Practitioner [facility]  I have reviewed the note and agree with the APP's assessment as described in this progress note  Katrinka Blazing, MD Gastroenterology and Hepatology [facility]

## 2023-01-11 NOTE — Patient Instructions (Signed)
Please continue with prevacid 30mg  twice daily You can try taking otc gas x or phazyme for flatulence/gas pain Continue with miralax as you are doing We will get at CT of your abdomen given your abdominal pain You may also benefit from starting a daily probiotic, these can be bought over the counter  Follow up 4 months  It was a pleasure to see you today. I want to create trusting relationships with patients and provide genuine, compassionate, and quality care. I truly value your feedback! please be on the lookout for a survey regarding your visit with me today. I appreciate your input about our visit and your time in completing this!    Lesleyanne Politte L. Jeanmarie Hubert, MSN, APRN, AGNP-C Adult-Gerontology Nurse Practitioner [facility] at [facility]

## 2023-01-12 NOTE — Telephone Encounter (Signed)
Clinical questions have been submitted-awaiting determination. 

## 2023-01-15 ENCOUNTER — Encounter (INDEPENDENT_AMBULATORY_CARE_PROVIDER_SITE_OTHER): Payer: Self-pay

## 2023-01-15 ENCOUNTER — Telehealth (INDEPENDENT_AMBULATORY_CARE_PROVIDER_SITE_OTHER): Payer: Self-pay | Admitting: Gastroenterology

## 2023-01-15 NOTE — Telephone Encounter (Signed)
CT approved via [facility]. Order ID: [id number] Valid dates:01/15/23-02/13/23 Pt scheduled for 01/30/23 at 12:30pm; pt to arrive at 12:15 pm at [facility]. Left message with [name] (daughter) to return call. Also sent my chart message.

## 2023-01-15 NOTE — Telephone Encounter (Signed)
Pt would like CT to be done at [facility]. Contacted Central Scheduling and cancelled 01/30/23 appt. Will fax auth and order to [facility]

## 2023-01-19 NOTE — Telephone Encounter (Signed)
Pharmacy Patient Advocate Encounter  Received notification from [facility] that Prior Authorization for Mycophenolate Mofetil 500MG  tablets has been APPROVED from 01/12/2023 to 01/12/2024   PA #/Case ID/Reference #: PA Case ID #: [id number]

## 2023-01-22 NOTE — Telephone Encounter (Signed)
Mychart message sent about mycophenolate approval

## 2023-01-24 DIAGNOSIS — R1031 Right lower quadrant pain: Secondary | ICD-10-CM | POA: Diagnosis not present

## 2023-01-29 ENCOUNTER — Encounter (INDEPENDENT_AMBULATORY_CARE_PROVIDER_SITE_OTHER): Payer: Self-pay | Admitting: *Deleted

## 2023-01-30 ENCOUNTER — Ambulatory Visit (INDEPENDENT_AMBULATORY_CARE_PROVIDER_SITE_OTHER): Payer: Medicare Other

## 2023-01-30 ENCOUNTER — Telehealth (INDEPENDENT_AMBULATORY_CARE_PROVIDER_SITE_OTHER): Payer: Self-pay | Admitting: *Deleted

## 2023-01-30 ENCOUNTER — Telehealth: Payer: Self-pay | Admitting: Cardiology

## 2023-01-30 ENCOUNTER — Ambulatory Visit (HOSPITAL_COMMUNITY): Payer: Medicare Other

## 2023-01-30 DIAGNOSIS — I495 Sick sinus syndrome: Secondary | ICD-10-CM | POA: Diagnosis not present

## 2023-01-30 NOTE — Telephone Encounter (Signed)
Transmission received.  Device function WNL.  Returned call to Pt to advise.

## 2023-01-30 NOTE — Telephone Encounter (Signed)
Copied message from cc chart so I could document:  Carlan, Jeral Pinch, NP  Metro Kung, LPN; Meredith Leeds, CMA Please let patient know, no GI findings on CT to explain her symptoms. She had a small amount of fluid in the pelvic area which is non specific and in some cases can be normal, but sometimes related to GYN causes, if she is still having lower abdominal pain, she should follow up with PCP/GYN for further evaluation of GYN causes of her pain.  Discussed with patient's daughter [name]  on dpr and she verbalized understanding. She asked for ct report to be sent to pcp.

## 2023-01-30 NOTE — Telephone Encounter (Signed)
Pt is calling wanting to know if you received her Pacemaker Transmission, Pt would like a c/b

## 2023-01-30 NOTE — Telephone Encounter (Signed)
report faxed to PCP  

## 2023-01-31 LAB — CUP PACEART REMOTE DEVICE CHECK
Battery Remaining Longevity: 89 mo
Battery Voltage: 2.98 V
Brady Statistic AP VP Percent: 0.04 %
Brady Statistic AP VS Percent: 95.82 %
Brady Statistic AS VP Percent: 0 %
Brady Statistic AS VS Percent: 4.14 %
Brady Statistic RA Percent Paced: 96.04 %
Brady Statistic RV Percent Paced: 0.04 %
Date Time Interrogation Session: 20241112004117
Implantable Lead Connection Status: 753985
Implantable Lead Connection Status: 753985
Implantable Lead Implant Date: 20190501
Implantable Lead Implant Date: 20190501
Implantable Lead Location: 753859
Implantable Lead Location: 753860
Implantable Lead Model: 3830
Implantable Lead Model: 5076
Implantable Pulse Generator Implant Date: 20190501
Lead Channel Impedance Value: 304 Ohm
Lead Channel Impedance Value: 342 Ohm
Lead Channel Impedance Value: 342 Ohm
Lead Channel Impedance Value: 456 Ohm
Lead Channel Pacing Threshold Amplitude: 0.625 V
Lead Channel Pacing Threshold Amplitude: 0.75 V
Lead Channel Pacing Threshold Pulse Width: 0.4 ms
Lead Channel Pacing Threshold Pulse Width: 0.4 ms
Lead Channel Sensing Intrinsic Amplitude: 0.875 mV
Lead Channel Sensing Intrinsic Amplitude: 0.875 mV
Lead Channel Sensing Intrinsic Amplitude: 11.125 mV
Lead Channel Sensing Intrinsic Amplitude: 11.125 mV
Lead Channel Setting Pacing Amplitude: 2 V
Lead Channel Setting Pacing Amplitude: 2.5 V
Lead Channel Setting Pacing Pulse Width: 0.4 ms
Lead Channel Setting Sensing Sensitivity: 0.9 mV
Zone Setting Status: 755011
Zone Setting Status: 755011

## 2023-02-19 DIAGNOSIS — D1801 Hemangioma of skin and subcutaneous tissue: Secondary | ICD-10-CM | POA: Diagnosis not present

## 2023-02-19 DIAGNOSIS — C44319 Basal cell carcinoma of skin of other parts of face: Secondary | ICD-10-CM | POA: Diagnosis not present

## 2023-02-19 DIAGNOSIS — L821 Other seborrheic keratosis: Secondary | ICD-10-CM | POA: Diagnosis not present

## 2023-02-19 DIAGNOSIS — L720 Epidermal cyst: Secondary | ICD-10-CM | POA: Diagnosis not present

## 2023-02-19 DIAGNOSIS — L812 Freckles: Secondary | ICD-10-CM | POA: Diagnosis not present

## 2023-02-26 ENCOUNTER — Other Ambulatory Visit: Payer: Self-pay | Admitting: Student

## 2023-02-27 NOTE — Progress Notes (Signed)
Remote pacemaker transmission.   

## 2023-04-10 ENCOUNTER — Encounter: Payer: Self-pay | Admitting: Neurology

## 2023-04-10 ENCOUNTER — Ambulatory Visit: Payer: PPO | Admitting: Neurology

## 2023-04-10 VITALS — BP 114/77 | HR 79

## 2023-04-10 DIAGNOSIS — G7000 Myasthenia gravis without (acute) exacerbation: Secondary | ICD-10-CM | POA: Diagnosis not present

## 2023-04-10 DIAGNOSIS — R269 Unspecified abnormalities of gait and mobility: Secondary | ICD-10-CM | POA: Diagnosis not present

## 2023-04-10 DIAGNOSIS — G4733 Obstructive sleep apnea (adult) (pediatric): Secondary | ICD-10-CM | POA: Diagnosis not present

## 2023-04-10 MED ORDER — PREDNISONE 1 MG PO TABS: 4.0000 mg | ORAL_TABLET | Freq: Every day | ORAL | 6 refills | Status: DC

## 2023-04-10 MED ORDER — MYCOPHENOLATE MOFETIL 500 MG PO TABS: 500.0000 mg | ORAL_TABLET | Freq: Two times a day (BID) | ORAL | 3 refills | Status: AC

## 2023-04-10 NOTE — Patient Instructions (Addendum)
[email or web address]  Drugs to avoid or use with caution in MG*  Many different drugs have been associated with worsening myasthenia gravis (MG). However, these drug associations do not necessarily mean that a patient with MG should not be prescribed these medications. In many instances, reports of worsening MG are very rare.  In some instances, there may only be a "chance" association (i.e. not causal). In addition, some of these drugs may be necessary for a patient's treatment and should not be deemed "off limits". It is advisable that patients and physicians recognize and discuss the possibility that a particular drug might worsen the patient's MG. They should also consider, when appropriate, the pros and cons of an alternate treatment, if available. It is important that the patient notify his or her physicians if the symptoms of MG worsen after starting any new medication. Only the more common prescription drugs with the strongest evidence suggesting an association with worsening MG are provided in the list below.  Telithromycin: antibiotic for community acquired pneumonia. The [place] FDA has designated a "black box" warning for this drug in MG. Should not be used in MG. Fluoroquinolones (e.g., ciprofloxacin, moxifloxacin and levofloxacin): commonly prescribed broadspectrum antibiotics that are associated with worsening MG. The [place] FDA has designated a "black box" warning for these agents in MG. Use cautiously, if at all. Botulinum toxin: avoid. D-penicillamine: used for Wilson disease and rarely for rheumatoid arthritis. Strongly associated with causing MG. Avoid Chloroquine (Aralen): used for malaria and amoeba infections. May worsen or precipitate MG. Use with caution. Hydroxychloroquine (Plaquenil): used for malaria, rheumatoid arthritis, and lupus. May worsen or precipitate MG. Use with caution. Quinine: occasionally used for leg cramps. Use prohibited except in malaria in [place]. Magnesium:  potentially dangerous if given intravenously, i.e. for eclampsia during late pregnancy or for hypomagnesemia. Use only if absolutely necessary and observe for worsening. Macrolide antibiotics (e.g., erythromycin, azithromycin, clarithromycin): commonly prescribed antibiotics for gram-positive bacterial infections. May worsen MG. Use cautiously, if at all. Aminoglycoside antibiotics (e.g., gentamycin, neomycin, tobramycin):used for gram-negative bacterial infections.  May worsen MG. Use cautiously if no alternative treatment available. Corticosteroids: A standard treatment for MG, but may cause transient worsening within the first two weeks. Monitor carefully for this possibility (see Table 1). Procainamide:  used for irregular heart rhythm. May worsen MG. Use with caution. Desferrioxamine: Chelating agent used for hemochromatosis. May worsen MG. Beta-blockers: commonly prescribed for hypertension, heart disease and migraine but potentially dangerous in MG. May worsen MG. Use cautiously. Statins (e.g., atorvastatin, pravastatin, rosuvastatin, simvastatin): used to reduce serum cholesterol. May worsen or precipitate MG.  Use cautiously if indicated and at lowest dose needed. Iodinated radiologic contrast agents: older reports document increased MG weakness, but modern contrast agents appear safer.  Use cautiously and observe for worsening.

## 2023-04-10 NOTE — Progress Notes (Signed)
Chief Complaint  Patient presents with   Myasthenia Gravis    RM15, daughter present, DG:LOVFIEPP gait, uses walker for mobility, has been having uti (memory problems), OSA ON CPAP: COMPLIANT TO WEAR MACHINE GREATER THAN 4 HOURS NIGHTLY, FEELS WELL RESTED. EPWORTH SCALE: 3      ASSESSMENT AND PLAN  [name] is a 83 y.o. female   Long history of myasthenia gravis  Ocular predominant,  Her examination is also complicated by severe arthritis, no significant bulbar or limb muscle weakness noted,  Will continue prednisone tapering, 1 mg decrement every months, then taper off, stop Mestinon,  keep CellCept 500 mg twice a day,  Return To Clinic With NP In 6 Months   DIAGNOSTIC DATA (LABS, IMAGING, TESTING) - I reviewed patient records, labs, notes, testing and imaging myself where available.  Laboratory evaluation from [facility] in April 2023, creatinine 1.37, calcium was elevated 10.4, kidney function normal TSH, mild elevated free T41.88, CBC showed normal hemoglobin of 14.0,  MEDICAL HISTORY:  [name], accompanied by her daughter, follow-up for myasthenia gravis, she was a patient of Dr. Anne Hahn.  I reviewed and summarized the referring note. PMHX. A fib, Eliquis Pacemaker HTN HLD Hypothyroidism Chronic low back, neck pain, taking Percocet 10/325 x 6 tabs a week by PCP Paraganglioma, retroperitoneal s/p resection in 2013, Right breast cancer, partial lobectomy.  Patient had severe arthritis, complains of multiple joints pain, significant chronic neck and low back pain, is getting scheduled narcotics through her primary care physician  She was diagnosed of myasthenia gravis more than 20 years ago, in 1990s she presented with double vision, myasthenia gravis symptoms are mainly persistent binocular diplopia, she never had significant swallowing difficulties/breathing difficulty, she has arm and lower extremity pain she contributed to her severe arthritis, musculoskeletal  pain  Dr. Anne Hahn took over her management of myasthenia gravis since February 2019 after her previous neurology retired, over the years, she has been treated with stable dose of CellCept 500 mg twice a day, on relatively low-dose of prednisone 10 mg for a long time, last visit in October 2022, she began to tapering down prednisone dose at a very slow pace, she worried about the long-term side effect of prednisone, and was not sure that it has helped her symptoms, she is a retired Designer, jewellery  She has severe bilateral cataract, status post surgery and lens implant of left eye, left eye vision is 20/20, she purposely delayed her right cataract surgery because the visual acuity difference make double vision less obvious, previously she has to wear eye patch to suppress 1 vision loss  UPDATE Apr 10 2023: She is accompanied by her daughter at today's clinical visit, doing well from myasthenia gravis standpoint, had left cataract surgery with complete recovery 20/20, delayed right cataract surgery to avoid the double vision, no bulbar weakness, no swallowing chewing difficulty, mild gait abnormality due to multiple joint pain, rely on her walker  She tolerating prednisone tapering, now on 5 mg daily, a much lower dose of Mestinon 60 mg half tablet every morning, did not notice any difference, continued on CellCept 500 twice a day  PHYSICAL EXAM:   Vitals:   04/10/23 1452 04/10/23 1454  BP: 115/82 114/77  Pulse: 79    Not recorded     There is no height or weight on file to calculate BMI.  PHYSICAL EXAMNIATION:  Gen: NAD, conversant, well nourised, well groomed  Cardiovascular: Regular rate rhythm, no peripheral edema, warm, nontender. Eyes: Conjunctivae clear without exudates or hemorrhage Neck: Supple, no carotid bruits. Pulmonary: Clear to auscultation bilaterally   NEUROLOGICAL EXAM:  MENTAL STATUS: Speech/cognition: Awake, alert, oriented to history taking  care of conversation  CRANIAL NERVES: CN II: Pupils round reactive to light, right afferent pupillary defect CN III, IV, VI: Right inferior exophoria, left superior/exophoria CN V: Facial sensation is intact to light touch CN VII: Face is symmetric with normal eye closure  CN VIII: Hearing is normal to causal conversation. CN IX, X: Phonation is normal. CN XI: Head turning and shoulder shrug are intact  MOTOR: Difficult to perform full exertion motor examination due to multiple joints pain, spine pain, felt there was no significant bilateral upper and lower extremity proximal and distal muscle weakness   REFLEXES: Reflexes are hypoactive and symmetric at the biceps, triceps, knees, and ankles. Plantar responses are flexor.  SENSORY: Intact to light touch vibratory sensation.  COORDINATION: There is no trunk or limb dysmetria noted.  GAIT/STANCE: She can get up with multiple attempts arm crossed, wide-based, cautious antalgic gait  REVIEW OF SYSTEMS:  Full 14 system review of systems performed and notable only for as above All other review of systems were negative.   ALLERGIES: Allergies  Allergen Reactions   Nitrofurantoin Macrocrystal Nausea And Vomiting   Ampicillin Rash    Did it involve swelling of the face/tongue/throat, SOB, or low BP? No Did it involve sudden or severe rash/hives, skin peeling, or any reaction on the inside of your mouth or nose? No Did you need to seek medical attention at a hospital or doctor's office? No When did it last happen? 20 years ago     If all above answers are "NO", may proceed with cephalosporin use.    Avocado Diarrhea and Nausea And Vomiting   Biaxin [Clarithromycin] Rash   Doxycycline Monohydrate Nausea And Vomiting   Moxifloxacin     Contraindication myasthenia gravis   Sensi-Care Protective Barrier [Petrolatum-Zinc Oxide] Rash   Tetracyclines & Related Nausea And Vomiting    HOME MEDICATIONS: Current Outpatient Medications   Medication Sig Dispense Refill   ALPRAZolam (XANAX) 0.5 MG tablet Take 1/4 or .25 mg by mouth as needed at bedtime     anastrozole (ARIMIDEX) 1 MG tablet TAKE (1) TABLET BY MOUTH ONCE DAILY. 90 tablet 0   apixaban (ELIQUIS) 2.5 MG TABS tablet Take 2.5 mg by mouth 2 (two) times daily.     Artificial Tear Solution (SOOTHE XP OP) Place 1 drop into both eyes 4 (four) times daily.     atenolol (TENORMIN) 25 MG tablet TAKE (1) TABLET BY MOUTH ONCE DAILY. MAY TAKE AN ADDITIONAL TABLET IF SYSTOLIC OVER 150 OR SEVERE PALPITATIONS. 90 tablet 3   atorvastatin (LIPITOR) 20 MG tablet Take 20 mg by mouth daily.     B Complex-C (B-COMPLEX WITH VITAMIN C) tablet Take 1 tablet by mouth daily with supper.      baclofen (LIORESAL) 10 MG tablet Take 0.5 tablets (5 mg total) by mouth at bedtime as needed for muscle spasms. 15 each 3   Coenzyme Q10 (COQ10) 100 MG CAPS Take 100 mg by mouth daily.      diclofenac sodium (VOLTAREN) 1 % GEL Apply 4 g topically 4 (four) times daily as needed (for pain).      DULoxetine (CYMBALTA) 20 MG capsule Take 20 mg by mouth 2 (two) times daily.     furosemide (LASIX) 20 MG tablet Take  1 tablet (20 mg total) by mouth as needed. 30 tablet 0   gabapentin (NEURONTIN) 300 MG capsule Take 1 capsule (300 mg total) by mouth 3 (three) times daily. (Patient taking differently: Take 300 mg by mouth 2 (two) times daily.)     lansoprazole (PREVACID) 30 MG capsule Take 1 capsule (30 mg total) by mouth 2 (two) times daily before a meal. TAKE (1) CAPSULE BY MOUTH TWICE DAILY. 180 capsule 3   levothyroxine (SYNTHROID) 100 MCG tablet Take 100 mcg by mouth daily.     lidocaine (LIDODERM) 5 % Place 1-3 patches onto the skin daily as needed (for pain). Remove & Discard patch within 12 hours or as directed by MD for pain     mycophenolate (CELLCEPT) 500 MG tablet Take 1 tablet (500 mg total) by mouth 2 (two) times daily. 180 tablet 3   nitrofurantoin, macrocrystal-monohydrate, (MACROBID) 100 MG capsule  Take 100 mg by mouth 2 (two) times daily.     OVER THE COUNTER MEDICATION Calcium and vit D one bid     oxyCODONE-acetaminophen (PERCOCET) 10-325 MG tablet Take 1 tablet by mouth every 4 (four) hours as needed (pain).     polyethylene glycol powder (GLYCOLAX/MIRALAX) 17 GM/SCOOP powder Take 8.5 g by mouth daily.     potassium chloride SA (KLOR-CON M) 20 MEQ tablet Take 20 mEq by mouth as needed (with the lasix).     predniSONE (DELTASONE) 5 MG tablet Take 1 tablet daily, will use the 1 mg tablets in addition to taper by 1 mg monthly until down to 5 mg and stay on this dose 90 tablet 1   pyridostigmine (MESTINON) 60 MG tablet Take 0.5 tablets (30 mg total) by mouth 4 (four) times daily. 180 tablet 3   SUMAtriptan (IMITREX) 50 MG tablet Take 50 mg by mouth every 2 (two) hours as needed. For migraines     Terbinafine 1 % GEL Apply 1 application topically 2 (two) times daily as needed (toe nail fungus).      traZODone (DESYREL) 50 MG tablet TAKE (1) TABLET BY MOUTH AT BEDTIME. 90 tablet 2   No current facility-administered medications for this visit.    PAST MEDICAL HISTORY: Past Medical History:  Diagnosis Date   Anemia    Atrial fibrillation (HCC)    Breast cancer (HCC) 09/2019   right breast    Chronic back pain    Chronic nausea    Complication of anesthesia    hypotension, elevated heart rate and oxygenation desaturation   Depression with anxiety    Diverticulosis    Dyspnea    due to myasthenia gravis   Essential hypertension    GERD (gastroesophageal reflux disease)    History of pneumonia    Hypothyroidism    IBS (irritable colon syndrome)    LVH (left ventricular hypertrophy)    Migraines    Mixed hyperlipidemia    Myasthenia gravis (HCC)    Obesity    Ocular myasthenia gravis (HCC)    Osteoarthritis of knee    Pacemaker    PAF (paroxysmal atrial fibrillation) (HCC)    chads2vasc score is at least 4   Paraganglioma (HCC)    Resection 02/2012 (retroperitoneal)    Paraganglioma, malignant (HCC) 11/06/2015   Presence of permanent cardiac pacemaker 07/18/2017   Renal insufficiency    Skin cancer     PAST SURGICAL HISTORY: Past Surgical History:  Procedure Laterality Date   ABDOMINAL HYSTERECTOMY     BIOPSY  10/31/2022   Procedure:  BIOPSY;  Surgeon: Marguerita Merles, Reuel Boom, MD;  [facility];  Service: Gastroenterology;;   BREAST BIOPSY Right 09/2019   BREAST LUMPECTOMY Right 10/28/2019   CARDIAC CATHETERIZATION     CHOLECYSTECTOMY     COLONOSCOPY  08/24/2011   Procedure: COLONOSCOPY;  Surgeon: Malissa Hippo, MD;  [facility]: [facility];  Service: Endoscopy;  Laterality: N/A;  1200   COLONOSCOPY WITH PROPOFOL N/A 10/31/2022   Procedure: COLONOSCOPY WITH PROPOFOL;  Surgeon: Dolores Frame, MD;  [facility]: [facility];  Service: Gastroenterology;  Laterality: N/A;  9:45AM;ASA 3   CT guided biopsy  01/16/12   ESOPHAGOGASTRODUODENOSCOPY (EGD) WITH PROPOFOL N/A 10/31/2022   Procedure: ESOPHAGOGASTRODUODENOSCOPY (EGD) WITH PROPOFOL;  Surgeon: Dolores Frame, MD;  [facility];  Service: Gastroenterology;  Laterality: N/A;  9:45AM;ASA 3   GIVENS CAPSULE STUDY  01/29/2012   Procedure: GIVENS CAPSULE STUDY;  Surgeon: Malissa Hippo, MD;  [facility]: [facility];  Service: Endoscopy;  Laterality: N/A;  730   KNEE SURGERY     LAPAROTOMY  03/01/2012   Procedure: EXPLORATORY LAPAROTOMY;  Surgeon: Velora Heckler, MD;  Location: [facility];  Service: General;  Laterality: N/A;  Exploratory Laparotomy ,Resection Retroperitoneal Mass   MASTECTOMY, PARTIAL Right 10/28/2019   Procedure: RIGHT BREAST PARTIAL MASTECTOMY;  Surgeon: Darnell Level, MD;  Location:  SURGERY CENTER;  Service: General;  Laterality: Right;   NASAL SINUS SURGERY     30 yrs ago   PACEMAKER IMPLANT N/A 07/18/2017   Procedure: PACEMAKER IMPLANT;  Surgeon: Marinus Maw, MD;  [facility];  Service: Cardiovascular;  Laterality: N/A;    POLYPECTOMY  10/31/2022   Procedure: POLYPECTOMY;  Surgeon: Dolores Frame, MD;  [facility];  Service: Gastroenterology;;   RIGHT/LEFT HEART CATH AND CORONARY ANGIOGRAPHY N/A 11/18/2018   Procedure: RIGHT/LEFT HEART CATH AND CORONARY ANGIOGRAPHY;  Surgeon: Lyn Records, MD;  Location: [facility];  Service: Cardiovascular;  Laterality: N/A;   TOTAL KNEE ARTHROPLASTY Bilateral    2005 and 2011    FAMILY HISTORY: Family History  Problem Relation Age of Onset   Inflammatory bowel disease Maternal Grandmother    Sleep apnea Daughter    Sleep apnea Daughter    Sleep apnea Son     SOCIAL HISTORY: Social History   Socioeconomic History   Marital status: Widowed    Spouse name: Not on file   Number of children: Not on file   Years of education: Not on file   Highest education level: Not on file  Occupational History   Occupation: Retired CCU nurse  Tobacco Use   Smoking status: Never    Passive exposure: Never   Smokeless tobacco: Never  Vaping Use   Vaping status: Never Used  Substance and Sexual Activity   Alcohol use: No    Alcohol/week: 0.0 standard drinks of alcohol   Drug use: No   Sexual activity: Not on file  Other Topics Concern   Not on file  Social History Narrative   Lives in [place] alone.  Retired Administrator, arts.   Caffeine use: few ounces of coke about a every day, hot tea    Right handed   Social Drivers of Health   Financial Resource Strain: Low Risk  (04/10/2023)   [facility]   Overall Financial Resource Strain (CARDIA)    Difficulty of Paying Living Expenses: Not hard at all  Food Insecurity: No Food Insecurity (04/10/2023)  Received from [facility] Vital Sign    Worried About Running Out of Food in the Last Year: Never true    Ran Out of Food in the Last Year: Never true  Transportation Needs: No Transportation Needs (04/10/2023)   Received from [facility] - Transportation     Lack of Transportation (Medical): No    Lack of Transportation (Non-Medical): No  Physical Activity: Sufficiently Active (12/08/2022)   Received from [facility]   Exercise Vital Sign    Days of Exercise per Week: 5 days    Minutes of Exercise per Session: 30 min  Stress: No Stress Concern Present (12/08/2022)   Received from [facility] - Occupational Stress Questionnaire    Feeling of Stress : Not at all  Social Connections: Moderately Integrated (12/08/2022)   Received from [facility]   Social Network    How would you rate your social network (family, work, friends)?: Adequate participation with social networks  Intimate Partner Violence: Not At Risk (12/08/2022)   Received from [facility]   HITS    Over the last 12 months how often did your partner physically hurt you?: Never    Over the last 12 months how often did your partner insult you or talk down to you?: Never    Over the last 12 months how often did your partner threaten you with physical harm?: Never    Over the last 12 months how often did your partner scream or curse at you?: Never      Levert Feinstein, M.D. Ph.D.  [facility] [address], [address] [place], [place] [address] Ph: [phone] Fax: [phone]  CC:  Roe Rutherford, NP [address] [place],  [place] [address]  Roe Rutherford, NP

## 2023-04-10 NOTE — Progress Notes (Unsigned)
Cardiology Office Note:    Date:  04/11/2023  ID:  [name], DOB April 21, 1940, MRN [id number] PCP: Roe Rutherford, NP  [facility] Providers Cardiologist:  Donato Schultz, MD       Patient Profile:      (HFpEF) heart failure with preserved ejection fraction  TTE 08/08/21: EF 60-65, no RWMA, mild LVH, NL RVSF, NL PASP, RVSP 31.5, severe LAE, mild RAE, mild MR, AV sclerosis, RAP 3  Paroxysmal atrial fibrillation  Coronary artery disease  Myoview 06/23/16: EF 59, low risk  LHC 11/18/18: D1 40, mean PA 13, mean PCWP 2 Symptomatic bradycardia S/p pacemaker  Hypertension  Hyperlipidemia  Orthostatic hypotension R Breast CA Hypothyroidism Myasthenia gravis         History of Present Illness:  Discussed the use of AI scribe software for clinical note transcription with the patient, who gave verbal consent to proceed.  [name] is a 83 y.o. female who returns for follow-up of CHF, A-fib, CAD. She was last seen in 12/2022. She is here with her daughter. The patient reports a persistent UTI that has not responded to initial treatment with Bactrim DS, leading to a change in medication to Macrobid. The patient also reports recent episodes of confusion and balance issues, leading to multiple falls. The patient has been experiencing weakness, loss of appetite, and weight loss, which she attributes to the UTI. The patient's myasthenia gravis is being managed by a neurologist, who recently provided a list of contraindicated medications. The patient is concerned about two of her current medications, atorvastatin and atenolol, which are listed. The patient has been on these medications for an extended period, with atenolol being used for multiple indications including migraine prevention, heart rate control, and blood pressure management. She has not had chest pain, significant shortness of breath, significant leg edema or syncope.     Review of Systems  Constitutional: Positive for  decreased appetite.  Gastrointestinal:  Negative for hematochezia and melena.  Genitourinary:  Positive for dysuria. Negative for hematuria.  Neurological:  Positive for loss of balance.  Psychiatric/Behavioral:  Positive for memory loss.   -See HPI    Studies Reviewed:       Results   LABS - Reviewed vis CareEverywhere  Creatinine: 0.98 (12/08/2022) Potassium: 4.1 (12/08/2022) ALT: 15 (12/08/2022) Hemoglobin: 12.9 (12/08/2022) Total cholesterol: 160 (12/08/2022) Triglycerides: 80 (12/08/2022)      Risk Assessment/Calculations:    CHA2DS2-VASc Score = 6   This indicates a 9.7% annual risk of stroke. The patient's score is based upon: CHF History: 1 HTN History: 1 Diabetes History: 0 Stroke History: 0 Vascular Disease History: 1 Age Score: 2 Gender Score: 1            Physical Exam:   VS:  BP 124/70   Pulse 82   Ht 5\' 3"  (1.6 m)   Wt 139 lb 12.8 oz (63.4 kg)   SpO2 94%   BMI 24.76 kg/m    Wt Readings from Last 3 Encounters:  04/11/23 139 lb 12.8 oz (63.4 kg)  01/11/23 141 lb 1.6 oz (64 kg)  01/09/23 142 lb 9.6 oz (64.7 kg)    Constitutional:      Appearance: Healthy appearance. Not in distress.  Neck:     Vascular: JVD normal.  Pulmonary:     Breath sounds: Normal breath sounds. No wheezing. No rales.  Cardiovascular:     Normal rate. Regular rhythm.     Murmurs: There is no murmur.  Edema:  Peripheral edema absent.  Abdominal:     Palpations: Abdomen is soft.      Assessment and Plan:   Assessment & Plan Chronic heart failure with preserved ejection fraction (HCC) NYHA II. Volume status stable. Continue Furosemide 20 mg once daily as needed. Coronary artery disease involving native coronary artery of native heart without angina pectoris Mild nonobstructive CAD on cath in 2020. She is not having angina. Continue Atorvastatin 20 mg once daily. Paroxysmal atrial fibrillation (HCC) Maintaining NSR by exam. Current wt is 63.4 kg and her SCr has been  < 1.5. Eliquis was previously reduced due to loss of appetite and weight loss. She has had issues with falls recently and loses her balance. Therefore, I think we should continue her on the lower dose of Eliquis, 2.5 mg twice daily, for now. Continue Atenolol 25 mg once daily.  Myasthenia gravis (HCC) She was seen by Dr. Terrace Arabia yesterday with Neurology. She brings in a list of meds today that are contraindicated in MG. Statins and beta-blocker are on the list. She has been on both of these for a long time. I will reach to Dr. Terrace Arabia to see if she has any concerns with her remaining on these before we decide to make changes.         Dispo:  Return in about 6 months (around 10/09/2023) for Routine Follow Up, w/ Dr. Anne Fu.  Signed, Tereso Newcomer, PA-C

## 2023-04-11 ENCOUNTER — Ambulatory Visit: Payer: PPO | Attending: Physician Assistant | Admitting: Physician Assistant

## 2023-04-11 ENCOUNTER — Encounter: Payer: Self-pay | Admitting: Physician Assistant

## 2023-04-11 VITALS — BP 124/70 | HR 82 | Ht 63.0 in | Wt 139.8 lb

## 2023-04-11 DIAGNOSIS — I5032 Chronic diastolic (congestive) heart failure: Secondary | ICD-10-CM | POA: Diagnosis not present

## 2023-04-11 DIAGNOSIS — G7000 Myasthenia gravis without (acute) exacerbation: Secondary | ICD-10-CM | POA: Diagnosis not present

## 2023-04-11 DIAGNOSIS — I480 Paroxysmal atrial fibrillation: Secondary | ICD-10-CM | POA: Diagnosis not present

## 2023-04-11 DIAGNOSIS — I2510 Atherosclerotic heart disease of native coronary artery without angina pectoris: Secondary | ICD-10-CM | POA: Diagnosis not present

## 2023-04-11 NOTE — Assessment & Plan Note (Signed)
She was seen by Dr. Terrace Arabia yesterday with Neurology. She brings in a list of meds today that are contraindicated in MG. Statins and beta-blocker are on the list. She has been on both of these for a long time. I will reach to Dr. Terrace Arabia to see if she has any concerns with her remaining on these before we decide to make changes.

## 2023-04-11 NOTE — Assessment & Plan Note (Signed)
Maintaining NSR by exam. Current wt is 63.4 kg and her SCr has been < 1.5. Eliquis was previously reduced due to loss of appetite and weight loss. She has had issues with falls recently and loses her balance. Therefore, I think we should continue her on the lower dose of Eliquis, 2.5 mg twice daily, for now. Continue Atenolol 25 mg once daily.

## 2023-04-11 NOTE — Assessment & Plan Note (Signed)
NYHA II. Volume status stable. Continue Furosemide 20 mg once daily as needed.

## 2023-04-11 NOTE — Patient Instructions (Signed)
Medication Instructions:  Your physician recommends that you continue on your current medications as directed. Please refer to the Current Medication list given to you today.    *If you need a refill on your cardiac medications before your next appointment, please call your pharmacy*   Lab Work: None    If you have labs (blood work) drawn today and your tests are completely normal, you will receive your results only by: MyChart Message (if you have MyChart) OR A paper copy in the mail If you have any lab test that is abnormal or we need to change your treatment, we will call you to review the results.   Testing/Procedures: None    Follow-Up: At [facility], you and your health needs are our priority.  As part of our continuing mission to provide you with exceptional heart care, we have created designated Provider Care Teams.  These Care Teams include your primary Cardiologist (physician) and Advanced Practice Providers (APPs -  Physician Assistants and Nurse Practitioners) who all work together to provide you with the care you need, when you need it.  We recommend signing up for the patient portal called "MyChart".  Sign up information is provided on this After Visit Summary.  MyChart is used to connect with patients for Virtual Visits (Telemedicine).  Patients are able to view lab/test results, encounter notes, upcoming appointments, etc.  Non-urgent messages can be sent to your provider as well.   To learn more about what you can do with MyChart, go to [email or web address].    Your next appointment:   6 month(s)  The format for your next appointment:   In Person  Provider:   Donato Schultz, MD    Other Instructions

## 2023-04-11 NOTE — Assessment & Plan Note (Signed)
Mild nonobstructive CAD on cath in 2020. She is not having angina. Continue Atorvastatin 20 mg once daily.

## 2023-05-01 ENCOUNTER — Ambulatory Visit (INDEPENDENT_AMBULATORY_CARE_PROVIDER_SITE_OTHER): Payer: Medicare Other

## 2023-05-01 DIAGNOSIS — I495 Sick sinus syndrome: Secondary | ICD-10-CM | POA: Diagnosis not present

## 2023-05-02 ENCOUNTER — Telehealth: Payer: Self-pay | Admitting: Pharmacist

## 2023-05-02 LAB — CUP PACEART REMOTE DEVICE CHECK
Battery Remaining Longevity: 88 mo
Battery Voltage: 2.98 V
Brady Statistic AP VP Percent: 0.03 %
Brady Statistic AP VS Percent: 94.49 %
Brady Statistic AS VP Percent: 0 %
Brady Statistic AS VS Percent: 5.48 %
Brady Statistic RA Percent Paced: 94.7 %
Brady Statistic RV Percent Paced: 0.04 %
Date Time Interrogation Session: 20250211004311
Implantable Lead Connection Status: 753985
Implantable Lead Connection Status: 753985
Implantable Lead Implant Date: 20190501
Implantable Lead Implant Date: 20190501
Implantable Lead Location: 753859
Implantable Lead Location: 753860
Implantable Lead Model: 3830
Implantable Lead Model: 5076
Implantable Pulse Generator Implant Date: 20190501
Lead Channel Impedance Value: 323 Ohm
Lead Channel Impedance Value: 342 Ohm
Lead Channel Impedance Value: 361 Ohm
Lead Channel Impedance Value: 475 Ohm
Lead Channel Pacing Threshold Amplitude: 0.5 V
Lead Channel Pacing Threshold Amplitude: 0.75 V
Lead Channel Pacing Threshold Pulse Width: 0.4 ms
Lead Channel Pacing Threshold Pulse Width: 0.4 ms
Lead Channel Sensing Intrinsic Amplitude: 0.625 mV
Lead Channel Sensing Intrinsic Amplitude: 0.625 mV
Lead Channel Sensing Intrinsic Amplitude: 11.25 mV
Lead Channel Sensing Intrinsic Amplitude: 11.25 mV
Lead Channel Setting Pacing Amplitude: 2 V
Lead Channel Setting Pacing Amplitude: 2.5 V
Lead Channel Setting Pacing Pulse Width: 0.4 ms
Lead Channel Setting Sensing Sensitivity: 0.9 mV
Zone Setting Status: 755011
Zone Setting Status: 755011

## 2023-05-02 NOTE — Telephone Encounter (Signed)
Pharmacy Patient Advocate Encounter   Received notification from CoverMyMeds that prior authorization for Mycophenolate Mofetil 500MG  tablets is required/requested.   Insurance verification completed.   The patient is insured through [facility] ADVANTAGE/RX ADVANCE .   Per test claim: PA required; PA submitted to above mentioned insurance via CoverMyMeds Key/confirmation #/EOC [facility] Status is pending

## 2023-05-06 ENCOUNTER — Encounter: Payer: Self-pay | Admitting: Internal Medicine

## 2023-05-07 NOTE — Telephone Encounter (Signed)
Pharmacy Patient Advocate Encounter  Received notification from [facility] ADVANTAGE/RX ADVANCE that Prior Authorization for Mycophenolate has been DENIED.  Full denial letter will be uploaded to the media tab. See denial reason below.   PA #/Case ID/Reference #:

## 2023-05-08 NOTE — Telephone Encounter (Signed)
She has been on mycophenolate as far back as 2020, why it is denied now?

## 2023-05-09 NOTE — Telephone Encounter (Signed)
Dr. Terrace Arabia,  Reason for denial below:

## 2023-05-09 NOTE — Telephone Encounter (Signed)
Put her on virtual visit tomorrow Feb 20th

## 2023-05-10 NOTE — Telephone Encounter (Signed)
Call to patient, left message to call office back. Offer VV with Dr. Terrace Arabia 05/10/23

## 2023-05-15 ENCOUNTER — Ambulatory Visit (INDEPENDENT_AMBULATORY_CARE_PROVIDER_SITE_OTHER): Payer: PPO | Admitting: Gastroenterology

## 2023-05-15 ENCOUNTER — Encounter (INDEPENDENT_AMBULATORY_CARE_PROVIDER_SITE_OTHER): Payer: Self-pay | Admitting: Gastroenterology

## 2023-05-15 VITALS — BP 125/80 | HR 64 | Temp 97.7°F | Ht 63.0 in | Wt 138.0 lb

## 2023-05-15 DIAGNOSIS — K219 Gastro-esophageal reflux disease without esophagitis: Secondary | ICD-10-CM

## 2023-05-15 DIAGNOSIS — K5900 Constipation, unspecified: Secondary | ICD-10-CM | POA: Diagnosis not present

## 2023-05-15 DIAGNOSIS — K581 Irritable bowel syndrome with constipation: Secondary | ICD-10-CM

## 2023-05-15 DIAGNOSIS — B370 Candidal stomatitis: Secondary | ICD-10-CM | POA: Diagnosis not present

## 2023-05-15 MED ORDER — NYSTATIN 100000 UNIT/ML MT SUSP: 5.0000 mL | Freq: Three times a day (TID) | OROMUCOSAL | 0 refills | Status: AC

## 2023-05-15 NOTE — Progress Notes (Unsigned)
 Referring Provider: Roe Rutherford, NP Primary Care Physician:  Roe Rutherford, NP Primary GI Physician: Dr. Levon Hedger   Chief Complaint  Patient presents with   Gastroesophageal Reflux    Follow up on GERD. Takes prevacid. States abdominal pain is better since last visit. Does have some issues with gas. Not currently taking anything for gas.    HPI:   [name] is a 83 y.o. female with past medical history of  GERD, atrial fibrillation, paraganglioma, GERD, IBS, hypothyroidism, breast cancer, obesity, myasthenia gravis, hyperlipidemia, IBS and GERD   Patient presenting today for follow up of constipation, abdominal pain and GERD  Last seen October 2024, at that time patient reports she is taking half a capful of MiraLAX twice daily and had easy to pass BMs.  Appetite not great.  Weight events over the past 6 months.  Having some pain in her left side when she had workup by cardiology for muscle and was not her heart.  Having some food regurgitation at times.  Taking Prevacid 30 mg twice daily.  Also notes some lower abdominal pain occurs most days.  Since passing gas or had a bowel movement helps the symptoms.  Patient commended to continue Prevacid 30 mg twice daily, try Phazyme or Gas-X, start daily probiotic, continue MiraLAX half capful twice daily, CTAP with contrast, good reflux precautions  CT A/P with contrast on 01/24/23 was unremarkable other than some trace ascites  Present: Doing well on miralax 1/2 once to twice daily. Having a BM daily. She reports that she has some occasional pain that feels like gas pain, she is not currently taking anything for this. She gets relief when she passes gas. No rectal bleeding or melena.   GERD is well controlled on prevacid 30mg  BID. Denies any breakthrough. She has occasional pill dysphagia due to her myasthenia gravis.   She notes dry mouth at times. She wonders if this may be related to some of her meds. Her daughter who is a  Engineer, civil (consulting) states a few days ago it looked like she may have some thrush in her mouth. She tried some nystatin swish that they had at home.   Last Colonoscopy:10/2022 - Two 3 to 4 mm polyps in the transverse colon,                            removed with a cold snare. Resected and retrieved.                           - One 2 mm polyp in the rectum, removed with a cold                            biopsy forceps. Resected and retrieved.                           - Diverticulosis in the sigmoid colon, in the                            descending colon and in the transverse colon.                           - Non-bleeding internal hemorrhoids.   Last Endoscopy:10/2022 - Normal esophagus.                           -  Gastritis. Biopsied.                           - Normal examined duodenum. Pathology showed presence of normal gastric biopsies with negative H. pylori staining or dysplasia, 1 polyp was inflammatory and 2 polyps were lymphoid aggregates, repeat colonoscopy is not warranted given age.    Past Medical History:  Diagnosis Date   Anemia    Atrial fibrillation (HCC)    Breast cancer (HCC) 09/2019   right breast    Chronic back pain    Chronic nausea    Complication of anesthesia    hypotension, elevated heart rate and oxygenation desaturation   Depression with anxiety    Diverticulosis    Dyspnea    due to myasthenia gravis   Essential hypertension    GERD (gastroesophageal reflux disease)    History of pneumonia    Hypothyroidism    IBS (irritable colon syndrome)    LVH (left ventricular hypertrophy)    Migraines    Mixed hyperlipidemia    Myasthenia gravis (HCC)    Obesity    Ocular myasthenia gravis (HCC)    Osteoarthritis of knee    Pacemaker    PAF (paroxysmal atrial fibrillation) (HCC)    chads2vasc score is at least 4   Paraganglioma (HCC)    Resection 02/2012 (retroperitoneal)   Paraganglioma, malignant (HCC) 11/06/2015   Presence of permanent cardiac pacemaker  07/18/2017   Renal insufficiency    Skin cancer     Past Surgical History:  Procedure Laterality Date   ABDOMINAL HYSTERECTOMY     BIOPSY  10/31/2022   Procedure: BIOPSY;  Surgeon: Dolores Frame, MD;  Location: [facility];  Service: Gastroenterology;;   BREAST BIOPSY Right 09/2019   BREAST LUMPECTOMY Right 10/28/2019   CARDIAC CATHETERIZATION     CHOLECYSTECTOMY     COLONOSCOPY  08/24/2011   Procedure: COLONOSCOPY;  Surgeon: Malissa Hippo, MD;  Location: [facility];  Service: Endoscopy;  Laterality: N/A;  1200   COLONOSCOPY WITH PROPOFOL N/A 10/31/2022   Procedure: COLONOSCOPY WITH PROPOFOL;  Surgeon: Dolores Frame, MD;  Location: [facility];  Service: Gastroenterology;  Laterality: N/A;  9:45AM;ASA 3   CT guided biopsy  01/16/12   ESOPHAGOGASTRODUODENOSCOPY (EGD) WITH PROPOFOL N/A 10/31/2022   Procedure: ESOPHAGOGASTRODUODENOSCOPY (EGD) WITH PROPOFOL;  Surgeon: Dolores Frame, MD;  Location: [facility];  Service: Gastroenterology;  Laterality: N/A;  9:45AM;ASA 3   GIVENS CAPSULE STUDY  01/29/2012   Procedure: GIVENS CAPSULE STUDY;  Surgeon: Malissa Hippo, MD;  Location: [facility];  Service: Endoscopy;  Laterality: N/A;  730   KNEE SURGERY     LAPAROTOMY  03/01/2012   Procedure: EXPLORATORY LAPAROTOMY;  Surgeon: Velora Heckler, MD;  Location: [facility];  Service: General;  Laterality: N/A;  Exploratory Laparotomy ,Resection Retroperitoneal Mass   MASTECTOMY, PARTIAL Right 10/28/2019   Procedure: RIGHT BREAST PARTIAL MASTECTOMY;  Surgeon: Darnell Level, MD;  Location: [facility];  Service: General;  Laterality: Right;   NASAL SINUS SURGERY     30 yrs ago   PACEMAKER IMPLANT N/A 07/18/2017   Procedure: PACEMAKER IMPLANT;  Surgeon: Marinus Maw, MD;  Location: [facility];  Service: Cardiovascular;  Laterality: N/A;   POLYPECTOMY  10/31/2022   Procedure: POLYPECTOMY;  Surgeon: Dolores Frame, MD;  Location: [facility];  Service: Gastroenterology;;   RIGHT/LEFT HEART CATH AND CORONARY ANGIOGRAPHY  N/A 11/18/2018   Procedure: RIGHT/LEFT HEART CATH AND CORONARY ANGIOGRAPHY;  Surgeon: Lyn Records, MD;  Location: [facility] CV [facility];  Service: Cardiovascular;  Laterality: N/A;   TOTAL KNEE ARTHROPLASTY Bilateral    2005 and 2011    Current Outpatient Medications  Medication Sig Dispense Refill   ALPRAZolam (XANAX) 0.5 MG tablet Take 1/4 or .25 mg by mouth as needed at bedtime     anastrozole (ARIMIDEX) 1 MG tablet TAKE (1) TABLET BY MOUTH ONCE DAILY. 90 tablet 0   apixaban (ELIQUIS) 2.5 MG TABS tablet Take 2.5 mg by mouth 2 (two) times daily.     Artificial Tear Solution (SOOTHE XP OP) Place 1 drop into both eyes 4 (four) times daily.     atenolol (TENORMIN) 25 MG tablet TAKE (1) TABLET BY MOUTH ONCE DAILY. MAY TAKE AN ADDITIONAL TABLET IF SYSTOLIC OVER 150 OR SEVERE PALPITATIONS. 90 tablet 3   atorvastatin (LIPITOR) 20 MG tablet Take 20 mg by mouth daily.     B Complex-C (B-COMPLEX WITH VITAMIN C) tablet Take 1 tablet by mouth daily with supper.      baclofen (LIORESAL) 10 MG tablet Take 0.5 tablets (5 mg total) by mouth at bedtime as needed for muscle spasms. 15 each 3   Coenzyme Q10 (COQ10) 100 MG CAPS Take 100 mg by mouth daily.      diclofenac sodium (VOLTAREN) 1 % GEL Apply 4 g topically 4 (four) times daily as needed (for pain).      DULoxetine (CYMBALTA) 20 MG capsule Take 20 mg by mouth 2 (two) times daily.     furosemide (LASIX) 20 MG tablet Take 1 tablet (20 mg total) by mouth as needed. 30 tablet 0   gabapentin (NEURONTIN) 300 MG capsule Take 300 mg by mouth 2 (two) times daily.     guaifenesin (HUMIBID E) 400 MG TABS tablet Take 400 mg by mouth every 4 (four) hours.     lansoprazole (PREVACID) 30 MG capsule Take 1 capsule (30 mg total) by mouth 2 (two) times daily before a meal. TAKE (1) CAPSULE BY MOUTH TWICE DAILY. 180 capsule 3   levothyroxine (SYNTHROID) 112 MCG tablet Take 112 mcg  by mouth daily before breakfast.     lidocaine (LIDODERM) 5 % Place 1-3 patches onto the skin daily as needed (for pain). Remove & Discard patch within 12 hours or as directed by MD for pain     mycophenolate (CELLCEPT) 500 MG tablet Take 1 tablet (500 mg total) by mouth 2 (two) times daily. 180 tablet 3   OVER THE COUNTER MEDICATION Calcium and vit D one bid     Oxycodone HCl 10 MG TABS Take 10 mg by mouth every 4 (four) hours as needed (pain).     polyethylene glycol powder (GLYCOLAX/MIRALAX) 17 GM/SCOOP powder Take 8.5 g by mouth daily.     potassium chloride SA (KLOR-CON M) 20 MEQ tablet Take 20 mEq by mouth as needed (with the lasix).     predniSONE (DELTASONE) 1 MG tablet Take 4 tablets (4 mg total) by mouth daily with breakfast. 120 tablet 6   SUMAtriptan (IMITREX) 50 MG tablet Take 50 mg by mouth every 2 (two) hours as needed. For migraines     Terbinafine 1 % GEL Apply 1 application topically 2 (two) times daily as needed (toe nail fungus).      traZODone (DESYREL) 50 MG tablet TAKE (1) TABLET BY MOUTH AT BEDTIME. 90 tablet 2   No current facility-administered medications  for this visit.    Allergies as of 05/15/2023 - Review Complete 05/15/2023  Allergen Reaction Noted   Nitrofurantoin macrocrystal Nausea And Vomiting 12/06/2011   Ampicillin Rash 11/16/2010   Avocado Diarrhea and Nausea And Vomiting 04/05/2014   Biaxin [clarithromycin] Rash 10/19/2010   Doxycycline monohydrate Nausea And Vomiting 02/09/2015   Moxifloxacin  07/12/2017   Sensi-care protective barrier [petrolatum-zinc oxide] Rash 04/06/2014   Tetracyclines & related Nausea And Vomiting 10/19/2010    Family History  Problem Relation Age of Onset   Inflammatory bowel disease Maternal Grandmother    Sleep apnea Daughter    Sleep apnea Daughter    Sleep apnea Son     Social History   Socioeconomic History   Marital status: Widowed    Spouse name: Not on file   Number of children: Not on file   Years of  education: Not on file   Highest education level: Not on file  Occupational History   Occupation: Retired CCU nurse  Tobacco Use   Smoking status: Never    Passive exposure: Never   Smokeless tobacco: Never  Vaping Use   Vaping status: Never Used  Substance and Sexual Activity   Alcohol use: No    Alcohol/week: 0.0 standard drinks of alcohol   Drug use: No   Sexual activity: Not on file  Other Topics Concern   Not on file  Social History Narrative   Lives in [place] alone.  Retired Administrator, arts.   Caffeine use: few ounces of coke about a every day, hot tea    Right handed   Social Drivers of Health   Financial Resource Strain: Low Risk  (04/10/2023)   Received from [facility]   Overall Financial Resource Strain (CARDIA)    Difficulty of Paying Living Expenses: Not hard at all  Food Insecurity: No Food Insecurity (04/10/2023)   [facility]   Hunger Vital Sign    Worried About Running Out of Food in the Last Year: Never true    Ran Out of Food in the Last Year: Never true  Transportation Needs: No Transportation Needs (04/10/2023)   Received from [facility] - Transportation    Lack of Transportation (Medical): No    Lack of Transportation (Non-Medical): No  Physical Activity: Sufficiently Active (12/08/2022)   Received from [facility]   Exercise Vital Sign    Days of Exercise per Week: 5 days    Minutes of Exercise per Session: 30 min  Stress: No Stress Concern Present (12/08/2022)   Received from [facility] - Occupational Stress Questionnaire    Feeling of Stress : Not at all  Social Connections: Moderately Integrated (12/08/2022)   Received from [facility]   Social Network    How would you rate your social network (family, work, friends)?: Adequate participation with social networks    Review of systems General: negative for malaise, night sweats, fever, chills, weight los Neck:  Negative for lumps, goiter, pain and significant neck swelling Resp: Negative for cough, wheezing, dyspnea at rest CV: Negative for chest pain, leg swelling, palpitations, orthopnea GI: denies melena, hematochezia, nausea, vomiting, diarrhea, constipation, dysphagia, odyonophagia, early satiety or unintentional weight loss.  MSK: Negative for joint pain or swelling, back pain, and muscle pain. Derm: Negative for itching or rash Psych: Denies depression, anxiety, memory loss, confusion. No homicidal or suicidal ideation.  Heme: Negative for prolonged bleeding, bruising easily, and swollen nodes. Endocrine:  Negative for cold or heat intolerance, polyuria, polydipsia and goiter. Neuro: negative for tremor, gait imbalance, syncope and seizures. The remainder of the review of systems is noncontributory.  Physical Exam: There were no vitals taken for this visit. General:   Alert and oriented. No distress noted. Pleasant and cooperative.  Head:  Normocephalic and atraumatic. Eyes:  Conjuctiva clear without scleral icterus. Mouth:  white patches to tongue  Heart: Normal rate and rhythm, s1 and s2 heart sounds present.  Lungs: Clear lung sounds in all lobes. Respirations equal and unlabored. Abdomen:  +BS, soft, non-tender and non-distended. No rebound or guarding. No HSM or masses noted. Neurologic:  Alert and  oriented x4 Psych:  Alert and cooperative. Normal mood and affect.  Invalid input(s): "6 MONTHS"   ASSESSMENT: [name] is a 83 y.o. female presenting today    PLAN:  -continue miralax 1/2 capful daily to BID -continue prevacid 30mg  BID  -can try phazyme or gas x otc for gas -magic mouthwash swish and spit 5ml TID x10 days  All questions were answered, patient verbalized understanding and is in agreement with plan as outlined above.    Follow Up: 1 year   Calandra Madura L. Jeanmarie Hubert, MSN, APRN, AGNP-C Adult-Gerontology Nurse Practitioner [facility]

## 2023-05-15 NOTE — Patient Instructions (Signed)
-  continue miralax 1/2 capful daily to BID -continue prevacid 30mg  BID  -can try phazyme or gas x otc for gas -nystatin swish and spit 5ml TID x10 days Continue with good water intake and healthy diet as you are doing  Follow up 1 year   It was a pleasure to see you today. I want to create trusting relationships with patients and provide genuine, compassionate, and quality care. I truly value your feedback! please be on the lookout for a survey regarding your visit with me today. I appreciate your input about our visit and your time in completing this!    Dorinda Stehr L. Jeanmarie Hubert, MSN, APRN, AGNP-C Adult-Gerontology Nurse Practitioner [facility]

## 2023-05-17 DIAGNOSIS — B370 Candidal stomatitis: Secondary | ICD-10-CM | POA: Insufficient documentation

## 2023-05-28 ENCOUNTER — Ambulatory Visit: Payer: Medicare Other | Attending: Internal Medicine | Admitting: Internal Medicine

## 2023-05-28 ENCOUNTER — Encounter: Payer: Self-pay | Admitting: Internal Medicine

## 2023-05-28 VITALS — BP 126/62 | HR 68 | Ht 63.0 in | Wt 142.0 lb

## 2023-05-28 DIAGNOSIS — I495 Sick sinus syndrome: Secondary | ICD-10-CM

## 2023-05-28 LAB — CUP PACEART INCLINIC DEVICE CHECK
Date Time Interrogation Session: 20250310134958
Implantable Lead Connection Status: 753985
Implantable Lead Connection Status: 753985
Implantable Lead Implant Date: 20190501
Implantable Lead Implant Date: 20190501
Implantable Lead Location: 753859
Implantable Lead Location: 753860
Implantable Lead Model: 3830
Implantable Lead Model: 5076
Implantable Pulse Generator Implant Date: 20190501

## 2023-05-28 NOTE — Progress Notes (Signed)
 HPI [name] returns today for followup. She is a very pleasant 34 you woman s/p PPM insertion due to sinus node dysfunction. She has HTN. She has a h/o migraine headaches. Since her PPM insertion she has done well.  She got Covid a couple of weeks ago but has recovered. She admits to being sedentary.  Allergies  Allergen Reactions   Nitrofurantoin Macrocrystal Nausea And Vomiting   Ampicillin Rash    Did it involve swelling of the face/tongue/throat, SOB, or low BP? No Did it involve sudden or severe rash/hives, skin peeling, or any reaction on the inside of your mouth or nose? No Did you need to seek medical attention at a hospital or doctor's office? No When did it last happen? 20 years ago     If all above answers are "NO", may proceed with cephalosporin use.    Avocado Diarrhea and Nausea And Vomiting   Biaxin [Clarithromycin] Rash   Doxycycline Monohydrate Nausea And Vomiting   Moxifloxacin     Contraindication myasthenia gravis   Sensi-Care Protective Barrier [Petrolatum-Zinc Oxide] Rash   Tetracyclines & Related Nausea And Vomiting     Current Outpatient Medications  Medication Sig Dispense Refill   ALPRAZolam (XANAX) 0.5 MG tablet Take 1/4 or .25 mg by mouth as needed at bedtime     anastrozole (ARIMIDEX) 1 MG tablet TAKE (1) TABLET BY MOUTH ONCE DAILY. 90 tablet 0   apixaban (ELIQUIS) 2.5 MG TABS tablet Take 2.5 mg by mouth 2 (two) times daily.     Artificial Tear Solution (SOOTHE XP OP) Place 1 drop into both eyes 4 (four) times daily.     atenolol (TENORMIN) 25 MG tablet TAKE (1) TABLET BY MOUTH ONCE DAILY. MAY TAKE AN ADDITIONAL TABLET IF SYSTOLIC OVER 150 OR SEVERE PALPITATIONS. 90 tablet 3   atorvastatin (LIPITOR) 20 MG tablet Take 20 mg by mouth daily.     B Complex-C (B-COMPLEX WITH VITAMIN C) tablet Take 1 tablet by mouth daily with supper.      baclofen (LIORESAL) 10 MG tablet Take 0.5 tablets (5 mg total) by mouth at bedtime as needed for muscle spasms.  15 each 3   Coenzyme Q10 (COQ10) 100 MG CAPS Take 100 mg by mouth daily.      diclofenac sodium (VOLTAREN) 1 % GEL Apply 4 g topically 4 (four) times daily as needed (for pain).      DULoxetine (CYMBALTA) 20 MG capsule Take 20 mg by mouth 2 (two) times daily.     furosemide (LASIX) 20 MG tablet Take 1 tablet (20 mg total) by mouth as needed. 30 tablet 0   gabapentin (NEURONTIN) 300 MG capsule Take 300 mg by mouth 2 (two) times daily.     guaifenesin (HUMIBID E) 400 MG TABS tablet Take 400 mg by mouth every 4 (four) hours.     lansoprazole (PREVACID) 30 MG capsule Take 1 capsule (30 mg total) by mouth 2 (two) times daily before a meal. TAKE (1) CAPSULE BY MOUTH TWICE DAILY. 180 capsule 3   levothyroxine (SYNTHROID) 112 MCG tablet Take 112 mcg by mouth daily before breakfast.     lidocaine (LIDODERM) 5 % Place 1-3 patches onto the skin daily as needed (for pain). Remove & Discard patch within 12 hours or as directed by MD for pain     mycophenolate (CELLCEPT) 500 MG tablet Take 1 tablet (500 mg total) by mouth 2 (two) times daily. 180 tablet 3   OVER THE COUNTER  MEDICATION Calcium and vit D one bid     Oxycodone HCl 10 MG TABS Take 10 mg by mouth every 4 (four) hours as needed (pain).     polyethylene glycol powder (GLYCOLAX/MIRALAX) 17 GM/SCOOP powder Take 8.5 g by mouth daily.     potassium chloride SA (KLOR-CON M) 20 MEQ tablet Take 20 mEq by mouth as needed (with the lasix).     predniSONE (DELTASONE) 1 MG tablet Take 4 tablets (4 mg total) by mouth daily with breakfast. 120 tablet 6   SUMAtriptan (IMITREX) 50 MG tablet Take 50 mg by mouth every 2 (two) hours as needed. For migraines     Terbinafine 1 % GEL Apply 1 application topically 2 (two) times daily as needed (toe nail fungus).      traZODone (DESYREL) 50 MG tablet TAKE (1) TABLET BY MOUTH AT BEDTIME. 90 tablet 2   No current facility-administered medications for this visit.     Past Medical History:  Diagnosis Date   Anemia     Atrial fibrillation (HCC)    Breast cancer (HCC) 09/2019   right breast    Chronic back pain    Chronic nausea    Complication of anesthesia    hypotension, elevated heart rate and oxygenation desaturation   Diverticulosis    Dyspnea    due to myasthenia gravis   Essential hypertension    GERD (gastroesophageal reflux disease)    History of pneumonia    Hypothyroidism    IBS (irritable colon syndrome)    LVH (left ventricular hypertrophy)    Migraines    Mixed hyperlipidemia    Myasthenia gravis (HCC)    Obesity    Ocular myasthenia gravis (HCC)    Osteoarthritis of knee    Pacemaker    PAF (paroxysmal atrial fibrillation) (HCC)    chads2vasc score is at least 4   Paraganglioma [facility])    Resection 02/2012 (retroperitoneal)   Paraganglioma, malignant (HCC) 11/06/2015   Presence of permanent cardiac pacemaker 07/18/2017   Renal insufficiency    situational depression    Skin cancer     ROS:   All systems reviewed and negative except as noted in the HPI.   Past Surgical History:  Procedure Laterality Date   ABDOMINAL HYSTERECTOMY     BIOPSY  10/31/2022   Procedure: BIOPSY;  Surgeon: Dolores Frame, MD;  [facility]: [facility];  Service: Gastroenterology;;   BREAST BIOPSY Right 09/2019   BREAST LUMPECTOMY Right 10/28/2019   CARDIAC CATHETERIZATION     CHOLECYSTECTOMY     COLONOSCOPY  08/24/2011   Procedure: COLONOSCOPY;  Surgeon: Malissa Hippo, MD;  Location: [facility];  Service: Endoscopy;  Laterality: N/A;  1200   COLONOSCOPY WITH PROPOFOL N/A 10/31/2022   Procedure: COLONOSCOPY WITH PROPOFOL;  Surgeon: Dolores Frame, MD;  Location: [facility];  Service: Gastroenterology;  Laterality: N/A;  9:45AM;ASA 3   CT guided biopsy  01/16/12   ESOPHAGOGASTRODUODENOSCOPY (EGD) WITH PROPOFOL N/A 10/31/2022   Procedure: ESOPHAGOGASTRODUODENOSCOPY (EGD) WITH PROPOFOL;  Surgeon: Dolores Frame, MD;  [facility];  Service:  Gastroenterology;  Laterality: N/A;  9:45AM;ASA 3   GIVENS CAPSULE STUDY  01/29/2012   Procedure: GIVENS CAPSULE STUDY;  Surgeon: Malissa Hippo, MD;  Location: [facility];  Service: Endoscopy;  Laterality: N/A;  730   KNEE SURGERY     LAPAROTOMY  03/01/2012   Procedure: EXPLORATORY LAPAROTOMY;  Surgeon: Velora Heckler, MD;  Location: [facility];  Service: General;  Laterality: N/A;  Exploratory Laparotomy ,Resection Retroperitoneal Mass   MASTECTOMY, PARTIAL Right 10/28/2019   Procedure: RIGHT BREAST PARTIAL MASTECTOMY;  Surgeon: Darnell Level, MD;  Location: [facility];  Service: General;  Laterality: Right;   NASAL SINUS SURGERY     30 yrs ago   PACEMAKER IMPLANT N/A 07/18/2017   Procedure: PACEMAKER IMPLANT;  Surgeon: Marinus Maw, MD;  [facility]: [facility];  Service: Cardiovascular;  Laterality: N/A;   POLYPECTOMY  10/31/2022   Procedure: POLYPECTOMY;  Surgeon: Dolores Frame, MD;  [facility];  Service: Gastroenterology;;   RIGHT/LEFT HEART CATH AND CORONARY ANGIOGRAPHY N/A 11/18/2018   Procedure: RIGHT/LEFT HEART CATH AND CORONARY ANGIOGRAPHY;  Surgeon: Lyn Records, MD;  Location: [facility];  Service: Cardiovascular;  Laterality: N/A;   TOTAL KNEE ARTHROPLASTY Bilateral    2005 and 2011     Family History  Problem Relation Age of Onset   Inflammatory bowel disease Maternal Grandmother    Sleep apnea Daughter    Sleep apnea Daughter    Sleep apnea Son      Social History   Socioeconomic History   Marital status: Widowed    Spouse name: Not on file   Number of children: Not on file   Years of education: Not on file   Highest education level: Not on file  Occupational History   Occupation: Retired CCU nurse  Tobacco Use   Smoking status: Never    Passive exposure: Never   Smokeless tobacco: Never  Vaping Use   Vaping status: Never Used  Substance and Sexual Activity   Alcohol use: No    Alcohol/week: 0.0  standard drinks of alcohol   Drug use: No   Sexual activity: Not on file  Other Topics Concern   Not on file  Social History Narrative   Lives in [place] alone.  Retired Administrator, arts.   Caffeine use: few ounces of coke about a every day, hot tea    Right handed   Social Drivers of Health   Financial Resource Strain: Low Risk  (04/10/2023)   Received from [facility]   Overall Financial Resource Strain (CARDIA)    Difficulty of Paying Living Expenses: Not hard at all  Food Insecurity: No Food Insecurity (04/10/2023)   Received from [facility]   Hunger Vital Sign    Worried About Running Out of Food in the Last Year: Never true    Ran Out of Food in the Last Year: Never true  Transportation Needs: No Transportation Needs (04/10/2023)   Received from [facility] - Transportation    Lack of Transportation (Medical): No    Lack of Transportation (Non-Medical): No  Physical Activity: Sufficiently Active (12/08/2022)   Received from [facility]   Exercise Vital Sign    Days of Exercise per Week: 5 days    Minutes of Exercise per Session: 30 min  Stress: No Stress Concern Present (12/08/2022)   Received from [facility] - Occupational Stress Questionnaire    Feeling of Stress : Not at all  Social Connections: Moderately Integrated (12/08/2022)   Received from [facility]   Social Network    How would you rate your social network (family, work, friends)?: Adequate participation with social networks  Intimate Partner Violence: Not At Risk (12/08/2022)   Received from [facility]   HITS    Over the last 12  months how often did your partner physically hurt you?: Never    Over the last 12 months how often did your partner insult you or talk down to you?: Never    Over the last 12 months how often did your partner threaten you with physical harm?: Never    Over the last 12 months how often did your partner scream or curse  at you?: Never     BP 126/62 (BP Location: Left Arm, Patient Position: Sitting, Cuff Size: Normal)   Pulse 68   Ht 5\' 3"  (1.6 m)   Wt 142 lb (64.4 kg)   SpO2 98%   BMI 25.15 kg/m   Physical Exam:  Well appearing NAD HEENT: Unremarkable Neck:  No JVD, no thyromegally Lymphatics:  No adenopathy Back:  No CVA tenderness Lungs:  Clear with no wheezes HEART:  Regular rate rhythm, no murmurs, no rubs, no clicks Abd:  soft, positive bowel sounds, no organomegally, no rebound, no guarding Ext:  2 plus pulses, no edema, no cyanosis, no clubbing Skin:  No rashes no nodules Neuro:  CN II through XII intact, motor grossly intact  DEVICE  Normal device function.  See PaceArt for details.   Assess/Plan:  Sinus node dysfunction - she is asymptomatic, s/p PPM insertion. 2. PPM -her St. Jude DDD PM is working normally. 3. HTN - her bp is fairly well controlled at home though tends to run a little high in doctors offices.good today. 4. Coags - she has had no bleeding on eliquis.   Leonia Reeves.D.

## 2023-05-28 NOTE — Patient Instructions (Signed)

## 2023-06-11 NOTE — Addendum Note (Signed)
 Addended by: Geralyn Flash D on: 06/11/2023 04:50 PM   Modules accepted: Orders

## 2023-06-11 NOTE — Progress Notes (Signed)
 Remote pacemaker transmission.

## 2023-06-22 ENCOUNTER — Other Ambulatory Visit: Payer: Self-pay | Admitting: Neurology

## 2023-07-31 ENCOUNTER — Ambulatory Visit (INDEPENDENT_AMBULATORY_CARE_PROVIDER_SITE_OTHER): Payer: Medicare Other

## 2023-07-31 DIAGNOSIS — I495 Sick sinus syndrome: Secondary | ICD-10-CM

## 2023-08-01 LAB — CUP PACEART REMOTE DEVICE CHECK
Battery Remaining Longevity: 81 mo
Battery Voltage: 2.98 V
Brady Statistic AP VP Percent: 0.04 %
Brady Statistic AP VS Percent: 96.47 %
Brady Statistic AS VP Percent: 0 %
Brady Statistic AS VS Percent: 3.49 %
Brady Statistic RA Percent Paced: 96.75 %
Brady Statistic RV Percent Paced: 0.04 %
Date Time Interrogation Session: 20250513014240
Implantable Lead Connection Status: 753985
Implantable Lead Connection Status: 753985
Implantable Lead Implant Date: 20190501
Implantable Lead Implant Date: 20190501
Implantable Lead Location: 753859
Implantable Lead Location: 753860
Implantable Lead Model: 3830
Implantable Lead Model: 5076
Implantable Pulse Generator Implant Date: 20190501
Lead Channel Impedance Value: 323 Ohm
Lead Channel Impedance Value: 342 Ohm
Lead Channel Impedance Value: 361 Ohm
Lead Channel Impedance Value: 456 Ohm
Lead Channel Pacing Threshold Amplitude: 0.5 V
Lead Channel Pacing Threshold Amplitude: 0.75 V
Lead Channel Pacing Threshold Pulse Width: 0.4 ms
Lead Channel Pacing Threshold Pulse Width: 0.4 ms
Lead Channel Sensing Intrinsic Amplitude: 0.75 mV
Lead Channel Sensing Intrinsic Amplitude: 0.75 mV
Lead Channel Sensing Intrinsic Amplitude: 12.5 mV
Lead Channel Sensing Intrinsic Amplitude: 12.5 mV
Lead Channel Setting Pacing Amplitude: 2 V
Lead Channel Setting Pacing Amplitude: 2.5 V
Lead Channel Setting Pacing Pulse Width: 0.4 ms
Lead Channel Setting Sensing Sensitivity: 0.9 mV
Zone Setting Status: 755011
Zone Setting Status: 755011

## 2023-08-06 ENCOUNTER — Ambulatory Visit: Payer: Self-pay | Admitting: Internal Medicine

## 2023-08-16 ENCOUNTER — Inpatient Hospital Stay: Payer: Medicare Other | Attending: Hematology and Oncology | Admitting: Hematology and Oncology

## 2023-08-16 VITALS — BP 140/68 | HR 68 | Temp 98.1°F | Resp 15 | Wt 133.2 lb

## 2023-08-16 DIAGNOSIS — Z79811 Long term (current) use of aromatase inhibitors: Secondary | ICD-10-CM | POA: Diagnosis not present

## 2023-08-16 DIAGNOSIS — M1990 Unspecified osteoarthritis, unspecified site: Secondary | ICD-10-CM | POA: Insufficient documentation

## 2023-08-16 DIAGNOSIS — Z1722 Progesterone receptor negative status: Secondary | ICD-10-CM | POA: Diagnosis not present

## 2023-08-16 DIAGNOSIS — Z170 Estrogen receptor positive status [ER+]: Secondary | ICD-10-CM | POA: Diagnosis not present

## 2023-08-16 DIAGNOSIS — Z1732 Human epidermal growth factor receptor 2 negative status: Secondary | ICD-10-CM | POA: Diagnosis not present

## 2023-08-16 DIAGNOSIS — C50411 Malignant neoplasm of upper-outer quadrant of right female breast: Secondary | ICD-10-CM | POA: Diagnosis present

## 2023-08-16 MED ORDER — ANASTROZOLE 1 MG PO TABS: 1.0000 mg | ORAL_TABLET | Freq: Every day | ORAL | 3 refills | Status: AC

## 2023-08-16 NOTE — Progress Notes (Signed)
 Patient Care Team: Lorre Rosin, NP as PCP - General (Adult Health Nurse Practitioner) Hugh Madura, MD as PCP - Cardiology (Cardiology) Tammie Fall, MD as PCP - Electrophysiology (Cardiology) Zorita Hiss, Saunders Curio, NP (Inactive) as Nurse Practitioner (Internal Medicine) Cameron Cea, MD as Consulting Physician (Hematology and Oncology) Oralee Billow, MD as Consulting Physician (General Surgery) Harlen Lick, MD as Consulting Physician (Dermatology)  DIAGNOSIS:  Encounter Diagnosis  Name Primary?   Malignant neoplasm of upper-outer quadrant of right breast in female, estrogen receptor positive (HCC) Yes    SUMMARY OF ONCOLOGIC HISTORY: Oncology History Overview Note  SDHC Gene Abnormality Annual 24 hr urine collection for VMA's and catecholamines   Paraganglioma, malignant (HCC)  01/01/2012 Imaging   CT abdomen/pelvis no acute findings identified within the abdomen/pelvis. Slight increase in size of enlarge and enhancing periaortic LN   01/10/2012 PET scan   Intensely hypermetabolic solitary L periaortic LN is concerning for a lymphoproliferative process vs a solitary metastasis. Diffuse intensely hypermetabolic thyroid  activity is most consistent with a benign thyroiditis    01/16/2012 Initial Biopsy   IR CT guided biopsy of L para aortic lymph node   01/16/2012 Pathology Results   Low grade neoplasm with neuroendocrine differentiation. DDX includes paraganglioma, pheochromocytoma, and carcinoid   01/30/2012 Imaging   US  soft tissue head/neck diffusely enlarged hypervascular nodular thyroid  gland compatible with thyroiditis   03/01/2012 Surgery   Exploratory laparotomy, resection of retroperitoneal neuroendocrine tumor 3 x 2 x 2 cm with Dr. Oralee Billow   03/01/2012 Pathology Results   Paraganglioma with associated fibrosis 2.5 cm, negative margins   04/17/2014 PET scan   There is no evidence for residual or recurrent hypermetabolic tumor or adenopathy. 2.  Diffusely increased radiotracer uptake throughout both lobes of the thyroid  gland. Correlation with patient's TSH is recommended.    09/10/2015 Imaging   MRI neck with/without contrast at [facility]. No neck mass or LAD. Likely mildly asymmetric lingual tonsil tissue in the L vallecular. 1.3 cm likely calcified L thyroid  nodule.    10/08/2015 Imaging   CT C/A/P No evidence for residual or recurrent tumor or adenopathy.   06/15/2016 Imaging   MRI brain- No acute abnormality.  Negative for metastatic disease   Several small subcortical white matter hyperintensities have developed since 2013. This is most likely related to chronic microvascular ischemia.   06/15/2016 Imaging   CT CAP-   1. No evidence of thoracic metastasis. 1. No evidence of metastatic disease in the abdomen pelvis. 2. No periaortic adenopathy. 3. Sigmoid diverticulosis.  No diverticulitis.   Malignant neoplasm of upper-outer quadrant of right breast in female, estrogen receptor positive (HCC)  10/14/2019 Cancer Staging   Staging form: Breast, AJCC 8th Edition - Clinical stage from 10/14/2019: Stage IA (cT1b, cN0, cM0, G2, ER+, PR-, HER2-)   10/22/2019 Initial Diagnosis   Screening mammogram showed a right breast mass. Diagnostic mammogram and US  showed a 1.0cm right breast mass at the 10 o'clock position, no right axillary adenopathy. Biopsy showed IDC with DCIS, grade 2, HER-2 equivocal by IHC, negative by FISH, ER+ 95%, PR- 0%, Ki67 2%.   10/28/2019 Surgery   Right lumpectomy ([facility]) ([id number]): IDC, 1.4cm, grade 2,  with intermediate grade DCIS. Clear margins. No regional lymph nodes were examined. ER+, PR-, HER-2 -   10/28/2019 Cancer Staging   Staging form: Breast, AJCC 8th Edition - Pathologic stage from 10/28/2019: Stage IA (pT1c, pN0, cM0, G2, ER+, PR-, HER2-)   10/2019 - 10/2024 Anti-estrogen oral therapy  Anastrozole      CHIEF COMPLIANT: Follow-up on anastrozole  therapy  HISTORY OF PRESENT ILLNESS:    History of Present Illness [name] is an 83 year old female with estrogen receptor-positive breast cancer who presents for follow-up of her treatment.  She has been on anastrozole  for four years for estrogen receptor-positive breast cancer and is nearing the completion of the recommended five-year course. She tolerates the medication well, despite potential side effects such as hot flashes and joint stiffness.  She experiences widespread joint pain, primarily affecting her shoulders, arms, and back, with significant pain in the right shoulder and emerging symptoms in the left shoulder. This condition began approximately four years ago, affecting all joints, with notable changes in her hands.  No lumps are felt in her breasts, and she has stopped undergoing mammograms. No other symptoms related to her breasts are present.     ALLERGIES:  is allergic to nitrofurantoin  macrocrystal, ampicillin, avocado, biaxin [clarithromycin], doxycycline monohydrate, moxifloxacin, sensi-care protective barrier [diaper rash products], tetracyclines & related, aminoglycosides, chloroquine, deferoxamine, magnesium, procainamide, and quinine.  MEDICATIONS:  Current Outpatient Medications  Medication Sig Dispense Refill   ALPRAZolam  (XANAX ) 0.5 MG tablet Take 1/4 or .25 mg by mouth as needed at bedtime     anastrozole  (ARIMIDEX ) 1 MG tablet TAKE (1) TABLET BY MOUTH ONCE DAILY. 90 tablet 0   apixaban  (ELIQUIS ) 2.5 MG TABS tablet Take 2.5 mg by mouth 2 (two) times daily.     Artificial Tear Solution (SOOTHE XP OP) Place 1 drop into both eyes 4 (four) times daily.     atenolol  (TENORMIN ) 25 MG tablet TAKE (1) TABLET BY MOUTH ONCE DAILY. MAY TAKE AN ADDITIONAL TABLET IF SYSTOLIC OVER 150 OR SEVERE PALPITATIONS. 90 tablet 3   atorvastatin  (LIPITOR) 20 MG tablet Take 20 mg by mouth daily.     baclofen  (LIORESAL ) 10 MG tablet Take 0.5 tablets (5 mg total) by mouth at bedtime as needed for muscle spasms. 15 each 3    Coenzyme Q10 (COQ10) 100 MG CAPS Take 100 mg by mouth daily.      diclofenac  sodium (VOLTAREN ) 1 % GEL Apply 4 g topically 4 (four) times daily as needed (for pain).      DULoxetine (CYMBALTA) 20 MG capsule Take 20 mg by mouth 2 (two) times daily.     gabapentin  (NEURONTIN ) 300 MG capsule Take 300 mg by mouth 2 (two) times daily.     lansoprazole  (PREVACID ) 30 MG capsule Take 1 capsule (30 mg total) by mouth 2 (two) times daily before a meal. TAKE (1) CAPSULE BY MOUTH TWICE DAILY. 180 capsule 3   levothyroxine  (SYNTHROID ) 112 MCG tablet Take 112 mcg by mouth daily before breakfast.     lidocaine  (LIDODERM ) 5 % Place 1-3 patches onto the skin daily as needed (for pain). Remove & Discard patch within 12 hours or as directed by MD for pain     mycophenolate  (CELLCEPT ) 500 MG tablet Take 1 tablet (500 mg total) by mouth 2 (two) times daily. 180 tablet 3   OVER THE COUNTER MEDICATION Calcium and vit D one bid     Oxycodone  HCl 10 MG TABS Take 10 mg by mouth every 4 (four) hours as needed (pain).     polyethylene glycol powder (GLYCOLAX /MIRALAX ) 17 GM/SCOOP powder Take 8.5 g by mouth daily.     Terbinafine 1 % GEL Apply 1 application topically 2 (two) times daily as needed (toe nail fungus).      traZODone  (DESYREL ) 50  MG tablet TAKE (1) TABLET BY MOUTH AT BEDTIME. 90 tablet 2   furosemide  (LASIX ) 20 MG tablet Take 1 tablet (20 mg total) by mouth as needed. (Patient not taking: Reported on 08/16/2023) 30 tablet 0   guaifenesin (HUMIBID E) 400 MG TABS tablet Take 400 mg by mouth every 4 (four) hours. (Patient not taking: Reported on 08/16/2023)     potassium chloride  SA (KLOR-CON  M) 20 MEQ tablet Take 20 mEq by mouth as needed (with the lasix ). (Patient not taking: Reported on 08/16/2023)     SUMAtriptan  (IMITREX ) 50 MG tablet Take 50 mg by mouth every 2 (two) hours as needed. For migraines (Patient not taking: Reported on 08/16/2023)     No current facility-administered medications for this visit.     PHYSICAL EXAMINATION: ECOG PERFORMANCE STATUS: 1 - Symptomatic but completely ambulatory  Vitals:   08/16/23 1350  BP: (!) 140/68  Pulse: 68  Resp: 15  Temp: 98.1 F (36.7 C)  SpO2: 96%   Filed Weights   08/16/23 1350  Weight: 133 lb 3.2 oz (60.4 kg)    Physical Exam BREAST: Breasts normal on palpation.  (exam performed in the presence of a chaperone)  LABORATORY DATA:  I have reviewed the data as listed    Latest Ref Rng & Units 01/12/2022   11:37 AM 12/28/2020   12:03 PM 03/16/2020    3:50 PM  CMP  Glucose 70 - 99 mg/dL 72  90  161   BUN 8 - 27 mg/dL 13  18  16    Creatinine 0.57 - 1.00 mg/dL 0.96  0.45  4.09   Sodium 134 - 144 mmol/L 142  142  138   Potassium 3.5 - 5.2 mmol/L 3.9  4.5  4.3   Chloride 96 - 106 mmol/L 102  103  101   CO2 20 - 29 mmol/L 28  26  25    Calcium 8.7 - 10.3 mg/dL 9.9  81.1  9.7   Total Protein 6.0 - 8.5 g/dL 6.2  6.3  6.2   Total Bilirubin 0.0 - 1.2 mg/dL 0.5  0.4  0.4   Alkaline Phos 44 - 121 IU/L 52  50  57   AST 0 - 40 IU/L 15  22  18    ALT 0 - 32 IU/L 13  16  18      Lab Results  Component Value Date   WBC 6.1 01/12/2022   HGB 12.5 01/12/2022   HCT 38.7 01/12/2022   MCV 101 (H) 01/12/2022   PLT 144 (L) 01/12/2022   NEUTROABS 2.9 01/12/2022    ASSESSMENT & PLAN:  Malignant neoplasm of upper-outer quadrant of right breast in female, estrogen receptor positive (HCC) Right lumpectomy: 10/28/2019: Grade 2 IDC 1.4 cm, intermediate grade DCIS, margins negative, suspicious for LVI, ER 95%, PR 0%, HER-2 negative, Ki-67 2% T1c Nx stage Ia   Recommendation: Adjuvant antiestrogen therapy with anastrozole  1 mg daily x5 years Did not want to do radiation.   Anastrozole  Toxicities: Denies any adverse effects to anastrozole  therapy. Chronic arthritis     Breast cancer Surveillance: 1. Breast Exam: 08/16/2023: Benign 2. Mammograms: 07/02/2020: Benign breast density category B (she has not had any further mammograms since) Patient was  informed not undergoing any further mammograms because of her childhood exposure to every dose of radiation from diagnostic imaging.  Therefore we have not been doing any mammograms.     Return to clinic in 1 year for follow-up ------------------------------------- Assessment and Plan Assessment & Plan  Malignant neoplasm of upper-outer quadrant of right breast Breast cancer managed with anastrozole  for recurrence prevention. No new breast changes. Anastrozole  may cause hot flashes and joint stiffness. - Continue anastrozole  for one more year to complete five-year course. - Schedule follow-up in one year to reassess and consider treatment graduation.  Arthritis Chronic arthritis with significant pain and stiffness in multiple joints, possibly exacerbated by anastrozole . - Continue current arthritis medications. - Monitor symptoms and adjust treatment as needed.      No orders of the defined types were placed in this encounter.  The patient has a good understanding of the overall plan. she agrees with it. she will call with any problems that may develop before the next visit here. Total time spent: 30 mins including face to face time and time spent for planning, charting and co-ordination of care   Margert Sheerer, MD 08/16/23

## 2023-08-16 NOTE — Assessment & Plan Note (Signed)
 Right lumpectomy: 10/28/2019: Grade 2 IDC 1.4 cm, intermediate grade DCIS, margins negative, suspicious for LVI, ER 95%, PR 0%, HER-2 negative, Ki-67 2% T1c Nx stage Ia   Recommendation: Adjuvant antiestrogen therapy with anastrozole  1 mg daily x5 years Did not want to do radiation.   Anastrozole  Toxicities: Denies any adverse effects to anastrozole  therapy. Chronic arthritis     Breast cancer Surveillance: 1. Breast Exam: 08/16/2023: Benign 2. Mammograms: 07/02/2020: Benign breast density category B (she has not had any further mammograms since) Patient was informed not undergoing any further mammograms because of her childhood exposure to every dose of radiation from diagnostic imaging.  Therefore we have not been doing any mammograms.     Return to clinic in 1 year for follow-up

## 2023-09-17 NOTE — Progress Notes (Signed)
 Remote pacemaker transmission.

## 2023-09-17 NOTE — Addendum Note (Signed)
 Addended by: VICCI SELLER A on: 09/17/2023 10:28 AM   Modules accepted: Orders

## 2023-09-24 ENCOUNTER — Encounter: Payer: Self-pay | Admitting: *Deleted

## 2023-09-24 NOTE — Progress Notes (Signed)
 "     OFFICE NOTE:    Date:  09/25/2023  ID:  [name], DOB 31-Dec-1940, MRN [id number] PCP: Suanne Pfeiffer, NP  [facility] Providers Cardiologist:  Oneil Parchment, MD Electrophysiologist:  Danelle Birmingham, MD        (HFpEF) heart failure with preserved ejection fraction  TTE 08/08/21: EF 60-65, no RWMA, mild LVH, NL RVSF, NL PASP, RVSP 31.5, severe LAE, mild RAE, mild MR, AV sclerosis, RAP 3  Paroxysmal atrial fibrillation  Coronary artery disease  Myoview  06/23/16: EF 59, low risk  LHC 11/18/18: D1 40, mean PA 13, mean PCWP 2 Symptomatic bradycardia S/p pacemaker  Hypertension  Hyperlipidemia  Orthostatic hypotension R Breast CA Hypothyroidism Myasthenia gravis        Discussed the use of AI scribe software for clinical note transcription with the patient, who gave verbal consent to proceed. History of Present Illness [name] is a 83 y.o. female who returns for follow-up of CHF, A-fib, CAD. She was last seen in 03/2023. At that time, there were some concerns about Atenolol  and Atorvastatin causing issues with her Myasthenia Gravis. I reached out to her neurologist who felt it was ok to continue as she had been on them and tolerated them for a long time.   She presents with weakness and chest pain. She is accompanied by her daughter. She has been experiencing increasing weakness and fatigue, with symptoms worsening since this morning. The weakness has been present since the weekend. She describes chest pain located on the left side, not radiating, and not exacerbated by movement or pressure. The pain has been occurring intermittently for the past few weeks, is not severe, and does not feel like a heart attack. She has been experiencing chills and shaking, feeling cold, and has been weak since this morning. There is a concern for a possible urinary tract infection as she has had similar symptoms in the past, including confusion. She reports occasional difficulty getting to the  bathroom and some incontinence, but no burning sensation during urination. No fever, vomiting, or diarrhea. Some tenderness in her abdomen but no significant pain unless pressure is applied. She also reports having decubitus ulcers and is being managed by wound care. Her daughter notes that she seems more confused than usual. No syncope, cough, or wheezing. She sleeps in a chair due to arthritis and has not noticed any swelling in her legs.    ROS-See HPI    Studies Reviewed:  EKG Interpretation Date/Time:  Tuesday September 25 2023 13:39:41 EDT Ventricular Rate:  67 PR Interval:  190 QRS Duration:  104 QT Interval:  398 QTC Calculation: 420 R Axis:   -49  Text Interpretation: Atrial-paced rhythm Left anterior fascicular block Minimal voltage criteria for LVH, may be normal variant No significant change since last tracing 10/18/21 Confirmed by Lelon Hamilton [phone]) on 09/25/2023 1:42:51 PM    Risk Assessment/Calculations:  CHA2DS2-VASc Score = 6   This indicates a 9.7% annual risk of stroke. The patient's score is based upon: CHF History: 1 HTN History: 1 Diabetes History: 0 Stroke History: 0 Vascular Disease History: 1 Age Score: 2 Gender Score: 1           Physical Exam:  VS:  BP (!) 128/30   Pulse 60   Ht 5' 3 (1.6 m)   Wt 136 lb 6.4 oz (61.9 kg)   SpO2 100%   BMI 24.16 kg/m        Wt Readings from Last  3 Encounters:  09/25/23 136 lb 6.4 oz (61.9 kg)  08/16/23 133 lb 3.2 oz (60.4 kg)  05/28/23 142 lb (64.4 kg)    Constitutional:      Appearance: Frail. Acutely ill-appearing.  Neck:     Vascular: JVD normal.     Lymphadenopathy: No cervical adenopathy.  Pulmonary:     Breath sounds: Normal breath sounds. No wheezing. No rales.  Cardiovascular:     Normal rate. Regular rhythm.     Murmurs: There is no murmur.  Edema:    Peripheral edema absent.  Abdominal:     Palpations: Abdomen is soft.     Tenderness: There is no abdominal tenderness.  Skin:    General:  Skin is warm and dry.  Neurological:     General: No focal deficit present.       Assessment and Plan:    Assessment & Plan Altered mental status, unspecified altered mental status type She has not been feeling well for the last several days. She is having chills in the office. Her daughter notes that she is more confused. She has chest pain that seems noncardiac. EKG does not show any acute ST changes. She is more weak than usual. She has had UTIs in the past and her daughter notes she is acting like she did in the past. Her temp is not increased but she is 31. She has decubitus ulcers, prior UTIs, chest pain (?pneumonia). She has several possible causes of infection. I have recommended she go to the ED for expedited workup. We discussed the dangers of delayed management of infectious process such as sepsis. She feels too weak to go by private vehicle. -Will transport to [facility] via EMS Chronic heart failure with preserved ejection fraction (HCC) Echo 07/2021 with EF 60-65 mild LVH and mild MR. She is more short of breath but does not appear to volume overloaded on exam. I suspect her shortness of breath is related to her current illness. Continue Furosemide  20 mg as needed.  Paroxysmal atrial fibrillation (HCC) She has been maintained on low-dose Eliquis  due to history of frequent falls.  EKG today demonstrates sinus rhythm. -Continue Eliquis  2.5 mg twice daily, Atenolol  25 mg once daily  Coronary artery disease involving native coronary artery of native heart without angina pectoris Mild nonobstructive disease by cardiac catheterization in 2020. She is having some left sided chest pain but this sounds noncardiac. She has a lot of arthritis and notes her shoulders are chronically painful. She has had MSK chest pain in the past. Her EKG is a paced but no ST elevation or new TW changes are noted. As noted, she is being transported to the ED where serial Troponins will be checked.  Sinus node  dysfunction (HCC) Status post pacemaker.  She is followed by Dr. Waddell.         Dispo:  Return in about 6 months (around 03/27/2024) for Routine Follow Up.  Signed, Glendia Ferrier, PA-C   "

## 2023-09-25 ENCOUNTER — Ambulatory Visit: Attending: Physician Assistant | Admitting: Physician Assistant

## 2023-09-25 ENCOUNTER — Emergency Department (HOSPITAL_COMMUNITY)
Admission: EM | Admit: 2023-09-25 | Discharge: 2023-09-25 | Disposition: A | Discharge status: Home / Self Care | Attending: Emergency Medicine | Admitting: Emergency Medicine

## 2023-09-25 ENCOUNTER — Other Ambulatory Visit: Payer: Self-pay

## 2023-09-25 ENCOUNTER — Emergency Department (HOSPITAL_COMMUNITY)

## 2023-09-25 ENCOUNTER — Encounter: Payer: Self-pay | Admitting: Physician Assistant

## 2023-09-25 VITALS — BP 128/30 | HR 60 | Ht 63.0 in | Wt 136.4 lb

## 2023-09-25 DIAGNOSIS — I480 Paroxysmal atrial fibrillation: Secondary | ICD-10-CM | POA: Diagnosis not present

## 2023-09-25 DIAGNOSIS — I2510 Atherosclerotic heart disease of native coronary artery without angina pectoris: Secondary | ICD-10-CM | POA: Diagnosis not present

## 2023-09-25 DIAGNOSIS — R4182 Altered mental status, unspecified: Secondary | ICD-10-CM

## 2023-09-25 DIAGNOSIS — I5032 Chronic diastolic (congestive) heart failure: Secondary | ICD-10-CM

## 2023-09-25 DIAGNOSIS — K5792 Diverticulitis of intestine, part unspecified, without perforation or abscess without bleeding: Secondary | ICD-10-CM | POA: Diagnosis not present

## 2023-09-25 DIAGNOSIS — Z7901 Long term (current) use of anticoagulants: Secondary | ICD-10-CM | POA: Diagnosis not present

## 2023-09-25 DIAGNOSIS — I495 Sick sinus syndrome: Secondary | ICD-10-CM

## 2023-09-25 DIAGNOSIS — R102 Pelvic and perineal pain: Secondary | ICD-10-CM | POA: Diagnosis present

## 2023-09-25 LAB — COMPREHENSIVE METABOLIC PANEL WITH GFR
ALT: 10 U/L (ref 0–44)
AST: 16 U/L (ref 15–41)
Albumin: 3.5 g/dL (ref 3.5–5.0)
Alkaline Phosphatase: 52 U/L (ref 38–126)
Anion gap: 9 (ref 5–15)
BUN: 27 mg/dL — ABNORMAL HIGH (ref 8–23)
CO2: 26 mmol/L (ref 22–32)
Calcium: 9.8 mg/dL (ref 8.9–10.3)
Chloride: 106 mmol/L (ref 98–111)
Creatinine, Ser: 0.8 mg/dL (ref 0.44–1.00)
GFR, Estimated: >60 mL/min (ref >60)
Glucose, Bld: 114 mg/dL — ABNORMAL HIGH (ref 70–99)
Potassium: 4.1 mmol/L (ref 3.5–5.1)
Sodium: 141 mmol/L (ref 135–145)
Total Bilirubin: 0.6 mg/dL (ref 0.0–1.2)
Total Protein: 6.2 g/dL — ABNORMAL LOW (ref 6.5–8.1)

## 2023-09-25 LAB — URINALYSIS, ROUTINE W REFLEX MICROSCOPIC
Bilirubin Urine: NEGATIVE
Glucose, UA: NEGATIVE mg/dL
Hgb urine dipstick: NEGATIVE
Ketones, ur: NEGATIVE mg/dL
Leukocytes,Ua: NEGATIVE
Nitrite: NEGATIVE
Protein, ur: NEGATIVE mg/dL
Specific Gravity, Urine: 1.016 (ref 1.005–1.030)
pH: 7 (ref 5.0–8.0)

## 2023-09-25 LAB — CBC WITH DIFFERENTIAL/PLATELET
Abs Immature Granulocytes: 0.02 K/uL (ref 0.00–0.07)
Basophils Absolute: 0 K/uL (ref 0.0–0.1)
Basophils Relative: 1 %
Eosinophils Absolute: 0.1 K/uL (ref 0.0–0.5)
Eosinophils Relative: 1 %
HCT: 36.5 % (ref 36.0–46.0)
Hemoglobin: 11.7 g/dL — ABNORMAL LOW (ref 12.0–15.0)
Immature Granulocytes: 0 %
Lymphocytes Relative: 25 %
Lymphs Abs: 1.9 K/uL (ref 0.7–4.0)
MCH: 31.9 pg (ref 26.0–34.0)
MCHC: 32.1 g/dL (ref 30.0–36.0)
MCV: 99.5 fL (ref 80.0–100.0)
Monocytes Absolute: 0.6 K/uL (ref 0.1–1.0)
Monocytes Relative: 9 %
Neutro Abs: 5 K/uL (ref 1.7–7.7)
Neutrophils Relative %: 64 %
Platelets: 127 K/uL — ABNORMAL LOW (ref 150–400)
RBC: 3.67 MIL/uL — ABNORMAL LOW (ref 3.87–5.11)
RDW: 13.4 % (ref 11.5–15.5)
WBC: 7.6 K/uL (ref 4.0–10.5)
nRBC: 0 % (ref 0.0–0.2)

## 2023-09-25 MED ORDER — EPINEPHRINE 0.3 MG/0.3ML IJ SOAJ
INTRAMUSCULAR | Status: AC
  Filled 2023-09-25: qty 0.3

## 2023-09-25 MED ORDER — AMOXICILLIN-POT CLAVULANATE 875-125 MG PO TABS
1.0000 | ORAL_TABLET | Freq: Once | ORAL | Status: AC
  Administered 2023-09-25: 1 via ORAL
  Filled 2023-09-25: qty 1

## 2023-09-25 MED ORDER — AMOXICILLIN-POT CLAVULANATE 875-125 MG PO TABS: 1.0000 | ORAL_TABLET | Freq: Two times a day (BID) | ORAL | 0 refills | Status: AC

## 2023-09-25 MED ORDER — IOHEXOL 300 MG/ML  SOLN
100.0000 mL | Freq: Once | INTRAMUSCULAR | Status: AC | PRN
  Administered 2023-09-25: 100 mL via INTRAVENOUS

## 2023-09-25 NOTE — Assessment & Plan Note (Addendum)
 Mild nonobstructive disease by cardiac catheterization in 2020. She is having some left sided chest pain but this sounds noncardiac. She has a lot of arthritis and notes her shoulders are chronically painful. She has had MSK chest pain in the past. Her EKG is a paced but no ST elevation or new TW changes are noted. As noted, she is being transported to the ED where serial Troponins will be checked.

## 2023-09-25 NOTE — Discharge Instructions (Addendum)
 You have been seen and discharged from the emergency department.  You were found to have signs of early diverticulitis.  You are being placed on an antibiotic.  Due to your history of myasthenia gravis and multiple allergies Augmentin  has been chosen.  You are given your first dose here.  Fill the prescription and start taking your twice daily dose tomorrow.  Follow-up with your primary provider for further evaluation and further care. Take home medications as prescribed. If you have any worsening symptoms or further concerns for your health please return to an emergency department for further evaluation.

## 2023-09-25 NOTE — Patient Instructions (Signed)
 You are being sent to the Emergency Room.  We will put in a recall for 6 months for you to come back.  If hospital thinks you need to follow-up with us  after their visit, they will call and schedule.  We hope you feel better SOON :)

## 2023-09-25 NOTE — Assessment & Plan Note (Signed)
 Status post pacemaker.  She is followed by Dr. Waddell.

## 2023-09-25 NOTE — Assessment & Plan Note (Addendum)
 She has been maintained on low-dose Eliquis  due to history of frequent falls.  EKG today demonstrates sinus rhythm. -Continue Eliquis  2.5 mg twice daily, Atenolol  25 mg once daily

## 2023-09-25 NOTE — ED Triage Notes (Addendum)
 Pt BIBA from doctor's office for abdominal pain and possible uti.  Pain started today around 8am with weakness and lower abd pain.  Endorses chills.  Per pt daughter, pt has increasing lethargy and slow verbal responses.    V/S within range

## 2023-09-25 NOTE — ED Provider Notes (Signed)
 [place] EMERGENCY DEPARTMENT AT [facility] Provider Note   CSN: [id number] Arrival date & time: 09/25/23  1446     Patient presents with: Abdominal Pain   [name] is a 83 y.o. female.   HPI   83 year old female presents emergency department with concern for suprapubic discomfort with urination, generalized fatigue, exhaustion.  States that she started feeling this way over the weekend for the past 3 to 4 days.  She endorses chills and decreased appetite denies any fever, vomiting, diarrhea, cough, chest pain, abdominal pain.  Prior to Admission medications   Medication Sig Start Date End Date Taking? Authorizing Provider  ALPRAZolam  (XANAX ) 0.5 MG tablet Take 1/4 or .25 mg by mouth as needed at bedtime    [provider]  anastrozole  (ARIMIDEX ) 1 MG tablet Take 1 tablet (1 mg total) by mouth daily. 08/16/23   Gudena, Vinay, MD  apixaban  (ELIQUIS ) 2.5 MG TABS tablet Take 2.5 mg by mouth 2 (two) times daily.    [provider]  Artificial Tear Solution (SOOTHE XP OP) Place 1 drop into both eyes 4 (four) times daily.    [provider]  atenolol  (TENORMIN ) 25 MG tablet TAKE (1) TABLET BY MOUTH ONCE DAILY. MAY TAKE AN ADDITIONAL TABLET IF SYSTOLIC OVER 150 OR SEVERE PALPITATIONS. 02/28/23   Jeffrie Oneil BROCKS, MD  atorvastatin  (LIPITOR) 20 MG tablet Take 20 mg by mouth daily.    [provider]  baclofen  (LIORESAL ) 10 MG tablet Take 0.5 tablets (5 mg total) by mouth at bedtime as needed for muscle spasms. 12/28/20   Jenel Carlin POUR, MD  Coenzyme Q10 (COQ10) 100 MG CAPS Take 100 mg by mouth daily.     [provider]  diclofenac  sodium (VOLTAREN ) 1 % GEL Apply 4 g topically 4 (four) times daily as needed (for pain).  01/13/15   [provider]  DULoxetine (CYMBALTA) 20 MG capsule Take 20 mg by mouth 2 (two) times daily. 04/10/23   [provider]  furosemide  (LASIX ) 20 MG tablet Take 1 tablet (20 mg total) by  mouth as needed. 08/09/21   Gudena, Vinay, MD  gabapentin  (NEURONTIN ) 300 MG capsule Take 300 mg by mouth 2 (two) times daily.    [provider]  guaifenesin (HUMIBID E) 400 MG TABS tablet Take 400 mg by mouth every 4 (four) hours.    [provider]  lansoprazole  (PREVACID ) 30 MG capsule Take 1 capsule (30 mg total) by mouth 2 (two) times daily before a meal. TAKE (1) CAPSULE BY MOUTH TWICE DAILY. 10/31/22   Castaneda Mayorga, Daniel, MD  levothyroxine  (SYNTHROID ) 112 MCG tablet Take 112 mcg by mouth daily before breakfast.    [provider]  lidocaine  (LIDODERM ) 5 % Place 1-3 patches onto the skin daily as needed (for pain). Remove & Discard patch within 12 hours or as directed by MD for pain    [provider]  mycophenolate  (CELLCEPT ) 500 MG tablet Take 1 tablet (500 mg total) by mouth 2 (two) times daily. 04/10/23   Onita Duos, MD  OVER THE COUNTER MEDICATION Calcium and vit D one bid Patient not taking: Reported on 09/25/2023    [provider]  Oxycodone  HCl 10 MG TABS Take 10 mg by mouth every 4 (four) hours as needed (pain). 04/10/23   [provider]  polyethylene glycol powder (GLYCOLAX /MIRALAX ) 17 GM/SCOOP powder Take 8.5 g by mouth daily. 01/25/21   Golda Claudis PENNER, MD  potassium chloride  SA (KLOR-CON   M) 20 MEQ tablet Take 20 mEq by mouth as needed (with the lasix ).    [provider]  SUMAtriptan  (IMITREX ) 50 MG tablet Take 50 mg by mouth every 2 (two) hours as needed. For migraines    [provider]  Terbinafine 1 % GEL Apply 1 application topically 2 (two) times daily as needed (toe nail fungus).     [provider]  traZODone  (DESYREL ) 50 MG tablet TAKE (1) TABLET BY MOUTH AT BEDTIME. 07/19/20   Jenel Carlin POUR, MD    Allergies: Nitrofurantoin  macrocrystal, Ampicillin, Avocado, Biaxin [clarithromycin], Doxycycline monohydrate, Moxifloxacin, Sensi-care protective barrier [diaper rash products],  Tetracyclines & related, Aminoglycosides, Chloroquine, Deferoxamine, Magnesium, Procainamide, and Quinine    Review of Systems  Constitutional:  Positive for appetite change, chills and fatigue. Negative for fever.  Respiratory:  Negative for shortness of breath.   Cardiovascular:  Negative for chest pain.  Gastrointestinal:  Negative for abdominal pain, diarrhea and vomiting.  Genitourinary:  Positive for dysuria and pelvic pain. Negative for flank pain, hematuria, vaginal bleeding and vaginal discharge.  Skin:  Negative for rash.  Neurological:  Negative for headaches.    Updated Vital Signs BP (!) 110/54   Pulse 61   Temp (!) 97.2 F (36.2 C) (Oral)   Resp 18   SpO2 98%   Physical Exam Vitals and nursing note reviewed.  Constitutional:      General: She is not in acute distress.    Appearance: Normal appearance.  HENT:     Head: Normocephalic.     Mouth/Throat:     Mouth: Mucous membranes are moist.  Cardiovascular:     Rate and Rhythm: Normal rate.  Pulmonary:     Effort: Pulmonary effort is normal. No respiratory distress.  Abdominal:     General: Bowel sounds are normal. There is no distension.     Palpations: Abdomen is soft.     Tenderness: There is abdominal tenderness in the suprapubic area. There is no guarding or rebound.     Hernia: No hernia is present.  Skin:    General: Skin is warm.  Neurological:     Mental Status: She is alert and oriented to person, place, and time. Mental status is at baseline.  Psychiatric:        Mood and Affect: Mood normal.     (all labs ordered are listed, but only abnormal results are displayed) Labs Reviewed  URINALYSIS, ROUTINE W REFLEX MICROSCOPIC  CBC WITH DIFFERENTIAL/PLATELET  COMPREHENSIVE METABOLIC PANEL WITH GFR    EKG: None  Radiology: No results found.   Procedures   Medications Ordered in the ED  EPINEPHrine  (EPI-PEN) 0.3 mg/0.3 mL injection (has no administration in time range)                                     Medical Decision Making Amount and/or Complexity of Data Reviewed Labs: ordered. Radiology: ordered.  Risk Prescription drug management.   83 year old female presents emergency department lower abdominal pain, fatigue, chills.  Symptoms started this morning.  Concern for possible UTI.  Vitals are stable on arrival.  Patient has lower abdominal pain to palpation but otherwise benign abdomen.  Blood work is reassuring without acute abnormalities.  Urinalysis shows no infection.  For this reason a CT of the abdomen pelvis was pursued.  This shows signs of early acute diverticulitis.  Secondary to patient's myasthenia gravis disease as  well as multiple allergies Augmentin  was decided to be the best medication.  First dose given here in the department out difficulty.  Will plan for course of Augmentin  as an outpatient and follow-up.  Patient at this time appears safe and stable for discharge and close outpatient follow up. Discharge plan and strict return to ED precautions discussed, patient verbalizes understanding and agreement.     Final diagnoses:  None    ED Discharge Orders     None          Bari Roxie HERO, DO 09/25/23 2209

## 2023-09-25 NOTE — Assessment & Plan Note (Addendum)
 Echo 07/2021 with EF 60-65 mild LVH and mild MR. She is more short of breath but does not appear to volume overloaded on exam. I suspect her shortness of breath is related to her current illness. Continue Furosemide  20 mg as needed.

## 2023-10-17 ENCOUNTER — Encounter: Payer: Self-pay | Admitting: Neurology

## 2023-10-17 ENCOUNTER — Ambulatory Visit: Payer: Medicare Other | Admitting: Neurology

## 2023-10-17 VITALS — BP 110/66 | HR 62 | Ht 63.0 in | Wt 130.6 lb

## 2023-10-17 DIAGNOSIS — G7000 Myasthenia gravis without (acute) exacerbation: Secondary | ICD-10-CM

## 2023-10-17 DIAGNOSIS — G4733 Obstructive sleep apnea (adult) (pediatric): Secondary | ICD-10-CM | POA: Diagnosis not present

## 2023-10-17 NOTE — Patient Instructions (Signed)
 Use CPAP nightly for minimum 4 hours, I will order mask refit, chin strap, your mask is leaking. Check labs today. Continue Cellcept . Monitor for worsening weakness. Follow up in 6 months

## 2023-10-17 NOTE — Progress Notes (Signed)
 Patient: [name] Date of Birth: [birth year]  Reason for Visit: Follow up History from: Patient, daughter Primary Neurologist: Dr. Joli. Athar-OSA  ASSESSMENT AND PLAN 83 y.o. year old female   1.  Myasthenia gravis, ocular predominant (Dr. Jenel started prednisone  in March 2021 for generalized fatigue, shortness of breath with exertion).  Underlying factors of aging, chronic pain, arthritis, orthopedic conditions. - Weaned off prednisone  from 5 mg, cut by 1 mg monthly, off since end of June, stopped Mestinon , feels more fatigued, but multifactorial with severe arthritis, age, mask leak on CPAP/not wearing during all times of sleep, weight loss - Continue CellCept  500 mg twice a day - Check CBC, CMP, few weeks ago new anemia, low platelet during ER visit for diverticulitis  2.  OSA on CPAP -High mask leak, mask refit with option of chinstrap -Recommend nightly usage minimum 4 hours, recommend wear CPAP even with naps - In the past we have discussed getting a new sleep study, has lost 30 lbs since evaluation in March 2023 (showed moderate to severe OSA with total AHI 28.5/hour, REM AHI 40/hour) -She had titration study x 2; with the 2nd study didn't sleep well had trouble with sleep consolidation   Follow up with Dr. Onita in 6 months for [facility]  HISTORY [name], accompanied by her daughter, follow-up for myasthenia gravis, she was a patient of Dr. Jenel.   I reviewed and summarized the referring note. PMHX. A fib, Eliquis  Pacemaker HTN HLD Hypothyroidism Chronic low back, neck pain, taking Percocet 10/325 x 6 tabs a week by PCP Paraganglioma, retroperitoneal s/p resection in 2013, Right breast cancer, partial lobectomy.   Patient had severe arthritis, complains of multiple joints pain, significant chronic neck and low back pain, is getting scheduled narcotics through her primary care physician   She was diagnosed of myasthenia gravis more than 20 years ago, in  1990s she presented with double vision, myasthenia gravis symptoms are mainly persistent binocular diplopia, she never had significant swallowing difficulties/breathing difficulty, she has arm and lower extremity pain she contributed to her severe arthritis, musculoskeletal pain  Dr. Jenel took over her management of myasthenia gravis since February 2019 after her previous neurology retired, over the years, she has been treated with stable dose of CellCept  500 mg twice a day, on relatively low-dose of prednisone  10 mg for a long time, last visit in October 2022, she began to tapering down prednisone  dose at a very slow pace, she worried about the long-term side effect of prednisone , and was not sure that it has helped her symptoms, she is a retired Designer, jewellery   She has severe bilateral cataract, status post surgery and lens implant of left eye, left eye vision is 20/20, she purposely delayed her right cataract surgery because the visual acuity difference make double vision less obvious, previously she has to wear eye patch to suppress 1 vision loss  Update January 12, 2022 SS: tried to taper down prednisone  to 6 mg, felt trouble walking, balance, went back up to 9 mg. Still trouble with balance, using cane, still works in her flowers, lives alone. Sleeps in chair. Needs to have shoulder replacement to the right, hard to push with right. No falls in 3 years. On Cellcept  500 mg BID, takes Mestinon  60 mg 1/2 4 times daily tolerates well. Has constipation. On gabapentin  300 mg TID comes from PCP, oxycodone .   Remains on CPAP, notes leaking from mask, she is very still with sleep, uses full  face. Mentions constantly active, can't tell huge difference.  Review of CPAP data 12/13/2021-01/11/2022 shows 29/30 days at 97% usage, greater than 4 hours 22 days 73%.  Average usage days used 6 hours and 15 minutes.  Minimum pressure 7 maximum pressure 14 cm water .  Pressure in the 95th percentile 12, maximum 13.1.   Leak in the 95th percentile 56.6, AHI 23.7 (0.1 central, 8.6 obstructive, 14.2 unknown).  Update September 20, 2022 SS: Has been working to find a mask with good fit, using mask under the nose and over the mouth. Not leaking. Is a good fit. Mentions with CPAP no change vs without it. Always feels weak and tired from other health issues. Stays up late reading, sleeps 12 hours. Daughter thinks complains more about being weak when not using CPAP. Daughter is now living with her since she had PNA. Has lost 30 lbs since sleep evaluation in March 2023.   90 day CPAP data 06/22/22-09/19/22 usage 77/90 days at 86%.  Greater than 4 hours 42 days at 47%. 7-14 cm water .  Leak 84.6, AHI 28.5. 08/19/22-09/17/22 18/30 days at 60%, greater than 4 hours 14 days at 47%. 7-14 cm water .  Leak 53.3, AHI 24.4.  ESS 2.  Has reduced the prednisone  down to 5 mg daily. Cannot tell a difference on lower dose, was on 9 mg. Works in yard, works in the garden. 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 Feels MG stable. Very busy yesterday, today feels wiped out. Loses her balance, fall on porch reaching for light string. On oxycodone  for chronic pain, arthritis, needs bilateral should replacement, Eliquis  has been lowered due to bruising. Takes Mestinon  30 mg 4 times daily without any reported symptoms of MG. It makes her nose run.   UPDATE Apr 10 2023: She is accompanied by her daughter at today's clinical visit, doing well from myasthenia gravis standpoint, had left cataract surgery with complete recovery 20/20, delayed right cataract surgery to avoid the double vision, no bulbar weakness, no swallowing chewing difficulty, mild gait abnormality due to multiple joint pain, rely on her walker   She tolerating prednisone  tapering, now on 5 mg daily, a much lower dose of Mestinon  60 mg half tablet every morning, did not notice any difference, continued on CellCept  500 twice a day  Update October 17, 2023 SS: Last visit  with Dr. Onita agreed to taper off prednisone  by 1 mg monthly, finished end of June. She feels more tired, but doesn't think anything to do with prednisone . Not much appetite, 10 lbs lost in last year. Remains on cellcept  500 mg BID, off the mestinon . Terrible arthritis all over, everything causes her pain.  CPAP report 83% compliance greater than 4 hours, AHI 6.3, leak 63.5. using FFM, sleeps with mouth open. Cooks, does laundry, but gets fatigued afterwards.   REVIEW OF SYSTEMS: Out of a complete 14 system review of symptoms, the patient complains only of the following symptoms, and all other reviewed systems are negative.  See HPI  ALLERGIES: Allergies  Allergen Reactions   Nitrofurantoin  Macrocrystal Nausea And Vomiting   Ampicillin Rash    Did it involve swelling of the face/tongue/throat, SOB, or low BP? No Did it involve sudden or severe rash/hives, skin peeling, or any reaction on the inside of your mouth or nose? No Did you need to seek medical attention at a hospital or doctor's office? No When did it last happen? 20 years ago  If all above answers are "NO", may proceed with cephalosporin use.    Avocado Diarrhea and Nausea And Vomiting   Biaxin [Clarithromycin] Rash   Doxycycline Monohydrate Nausea And Vomiting   Moxifloxacin     Contraindication myasthenia gravis   Sensi-Care Protective Barrier [Diaper Rash Products] Rash   Tetracyclines & Related Nausea And Vomiting   Aminoglycosides    Chloroquine    Deferoxamine    Magnesium    Procainamide    Quinine     HOME MEDICATIONS: Outpatient Medications Prior to Visit  Medication Sig Dispense Refill   ALPRAZolam  (XANAX ) 0.5 MG tablet Take 1/4 or .25 mg by mouth as needed at bedtime     amoxicillin -clavulanate (AUGMENTIN ) 875-125 MG tablet Take 1 tablet by mouth every 12 (twelve) hours. 14 tablet 0   anastrozole  (ARIMIDEX ) 1 MG tablet Take 1 tablet (1 mg total) by mouth daily. 90 tablet 3   apixaban  (ELIQUIS ) 2.5 MG TABS  tablet Take 2.5 mg by mouth 2 (two) times daily.     Artificial Tear Solution (SOOTHE XP OP) Place 1 drop into both eyes 4 (four) times daily.     atenolol  (TENORMIN ) 25 MG tablet TAKE (1) TABLET BY MOUTH ONCE DAILY. MAY TAKE AN ADDITIONAL TABLET IF SYSTOLIC OVER 150 OR SEVERE PALPITATIONS. 90 tablet 3   atorvastatin  (LIPITOR) 20 MG tablet Take 20 mg by mouth daily.     baclofen  (LIORESAL ) 10 MG tablet Take 0.5 tablets (5 mg total) by mouth at bedtime as needed for muscle spasms. 15 each 3   Coenzyme Q10 (COQ10) 100 MG CAPS Take 100 mg by mouth daily.      diclofenac  sodium (VOLTAREN ) 1 % GEL Apply 4 g topically 4 (four) times daily as needed (for pain).      DULoxetine (CYMBALTA) 20 MG capsule Take 20 mg by mouth 2 (two) times daily.     furosemide  (LASIX ) 20 MG tablet Take 1 tablet (20 mg total) by mouth as needed. 30 tablet 0   gabapentin  (NEURONTIN ) 300 MG capsule Take 300 mg by mouth 2 (two) times daily.     guaifenesin (HUMIBID E) 400 MG TABS tablet Take 400 mg by mouth every 4 (four) hours.     lansoprazole  (PREVACID ) 30 MG capsule Take 1 capsule (30 mg total) by mouth 2 (two) times daily before a meal. TAKE (1) CAPSULE BY MOUTH TWICE DAILY. 180 capsule 3   levothyroxine  (SYNTHROID ) 112 MCG tablet Take 112 mcg by mouth daily before breakfast.     lidocaine  (LIDODERM ) 5 % Place 1-3 patches onto the skin daily as needed (for pain). Remove & Discard patch within 12 hours or as directed by MD for pain     mycophenolate  (CELLCEPT ) 500 MG tablet Take 1 tablet (500 mg total) by mouth 2 (two) times daily. 180 tablet 3   OVER THE COUNTER MEDICATION Calcium and vit D one bid     Oxycodone  HCl 10 MG TABS Take 10 mg by mouth every 4 (four) hours as needed (pain).     polyethylene glycol powder (GLYCOLAX /MIRALAX ) 17 GM/SCOOP powder Take 8.5 g by mouth daily.     potassium chloride  SA (KLOR-CON  M) 20 MEQ tablet Take 20 mEq by mouth as needed (with the lasix ).     SUMAtriptan  (IMITREX ) 50 MG tablet Take  50 mg by mouth every 2 (two) hours as needed. For migraines     Terbinafine 1 % GEL Apply 1 application topically 2 (two) times daily as needed (toe nail  fungus).      traZODone  (DESYREL ) 50 MG tablet TAKE (1) TABLET BY MOUTH AT BEDTIME. 90 tablet 2   No facility-administered medications prior to visit.    PAST MEDICAL HISTORY: Past Medical History:  Diagnosis Date   Anemia    Atrial fibrillation (HCC)    Breast cancer (HCC) 09/2019   right breast    Chronic back pain    Chronic nausea    Complication of anesthesia    hypotension, elevated heart rate and oxygenation desaturation   Diverticulosis    Dyspnea    due to myasthenia gravis   Essential hypertension    GERD (gastroesophageal reflux disease)    History of pneumonia    Hypothyroidism    IBS (irritable colon syndrome)    LVH (left ventricular hypertrophy)    Migraines    Mixed hyperlipidemia    Myasthenia gravis (HCC)    Obesity    Ocular myasthenia gravis (HCC)    Osteoarthritis of knee    Pacemaker    PAF (paroxysmal atrial fibrillation) (HCC)    chads2vasc score is at least 4   Paraganglioma (HCC)    Resection 02/2012 (retroperitoneal)   Paraganglioma, malignant (HCC) 11/06/2015   Presence of permanent cardiac pacemaker 07/18/2017   Renal insufficiency    situational depression    Skin cancer     PAST SURGICAL HISTORY: Past Surgical History:  Procedure Laterality Date   ABDOMINAL HYSTERECTOMY     BIOPSY  10/31/2022   Procedure: BIOPSY;  Surgeon: Eartha Angelia Sieving, MD;  [facility]: [facility];  Service: Gastroenterology;;   BREAST BIOPSY Right 09/2019   BREAST LUMPECTOMY Right 10/28/2019   CARDIAC CATHETERIZATION     CHOLECYSTECTOMY     COLONOSCOPY  08/24/2011   Procedure: COLONOSCOPY;  Surgeon: Claudis RAYMOND Rivet, MD;  Location: [facility];  Service: Endoscopy;  Laterality: N/A;  1200   COLONOSCOPY WITH PROPOFOL  N/A 10/31/2022   Procedure: COLONOSCOPY WITH PROPOFOL ;  Surgeon: Eartha Angelia Sieving, MD;  Location: [facility];  Service: Gastroenterology;  Laterality: N/A;  9:45AM;ASA 3   CT guided biopsy  01/16/12   ESOPHAGOGASTRODUODENOSCOPY (EGD) WITH PROPOFOL  N/A 10/31/2022   Procedure: ESOPHAGOGASTRODUODENOSCOPY (EGD) WITH PROPOFOL ;  Surgeon: Eartha Angelia Sieving, MD;  [facility]: [facility];  Service: Gastroenterology;  Laterality: N/A;  9:45AM;ASA 3   GIVENS CAPSULE STUDY  01/29/2012   Procedure: GIVENS CAPSULE STUDY;  Surgeon: Claudis RAYMOND Rivet, MD;  Location: [facility];  Service: Endoscopy;  Laterality: N/A;  730   KNEE SURGERY     LAPAROTOMY  03/01/2012   Procedure: EXPLORATORY LAPAROTOMY;  Surgeon: Krystal CHRISTELLA Spinner, MD;  Location: [facility];  Service: General;  Laterality: N/A;  Exploratory Laparotomy ,Resection Retroperitoneal Mass   MASTECTOMY, PARTIAL Right 10/28/2019   Procedure: RIGHT BREAST PARTIAL MASTECTOMY;  Surgeon: Spinner Krystal, MD;  Location: [facility];  Service: General;  Laterality: Right;   NASAL SINUS SURGERY     30 yrs ago   PACEMAKER IMPLANT N/A 07/18/2017   Procedure: PACEMAKER IMPLANT;  Surgeon: Waddell Danelle ORN, MD;  Location: [facility];  Service: Cardiovascular;  Laterality: N/A;   POLYPECTOMY  10/31/2022   Procedure: POLYPECTOMY;  Surgeon: Eartha Angelia Sieving, MD;  [facility];  Service: Gastroenterology;;   RIGHT/LEFT HEART CATH AND CORONARY ANGIOGRAPHY N/A 11/18/2018   Procedure: RIGHT/LEFT HEART CATH AND CORONARY ANGIOGRAPHY;  Surgeon: Claudene Victory ORN, MD;  Location: [facility];  Service: Cardiovascular;  Laterality: N/A;  TOTAL KNEE ARTHROPLASTY Bilateral    2005 and 2011    FAMILY HISTORY: Family History  Problem Relation Age of Onset   Inflammatory bowel disease Maternal Grandmother    Sleep apnea Daughter    Sleep apnea Daughter    Sleep apnea Son     SOCIAL HISTORY: Social History   Socioeconomic History   Marital status: Widowed    Spouse name: Not on file   Number of children:  Not on file   Years of education: Not on file   Highest education level: Not on file  Occupational History   Occupation: Retired CCU nurse  Tobacco Use   Smoking status: Never    Passive exposure: Never   Smokeless tobacco: Never  Vaping Use   Vaping status: Never Used  Substance and Sexual Activity   Alcohol use: No    Alcohol/week: 0.0 standard drinks of alcohol   Drug use: No   Sexual activity: Not on file  Other Topics Concern   Not on file  Social History Narrative   Lives in [place] alone.  Retired Administrator, arts.   Caffeine use: few ounces of coke about a every day, hot tea    Right handed   Social Drivers of Health   Financial Resource Strain: Low Risk  (04/10/2023)   Received from [facility]   Overall Financial Resource Strain (CARDIA)    Difficulty of Paying Living Expenses: Not hard at all  Food Insecurity: No Food Insecurity (04/10/2023)   [facility]   Hunger Vital Sign    Within the past 12 months, you worried that your food would run out before you got the money to buy more.: Never true    Within the past 12 months, the food you bought just didn't last and you didn't have money to get more.: Never true  Transportation Needs: No Transportation Needs (04/10/2023)   Received [facility] - Transportation    Lack of Transportation (Medical): No    Lack of Transportation (Non-Medical): No  Physical Activity: Sufficiently Active (12/08/2022)   Received from [facility]   Exercise Vital Sign    On average, how many days per week do you engage in moderate to strenuous exercise (like a brisk walk)?: 5 days    On average, how many minutes do you engage in exercise at this level?: 30 min  Stress: No Stress Concern Present (12/08/2022)   Received from [facility] - Occupational Stress Questionnaire    Feeling of Stress : Not at all  Social Connections: Moderately Integrated (12/08/2022)    Received from [facility]   Social Network    How would you rate your social network (family, work, friends)?: Adequate participation with social networks  Intimate Partner Violence: Not At Risk (12/08/2022)   Received from [facility]   HITS    Over the last 12 months how often did your partner physically hurt you?: Never    Over the last 12 months how often did your partner insult you or talk down to you?: Never    Over the last 12 months how often did your partner threaten you with physical harm?: Never    Over the last 12 months how often did your partner scream or curse at you?: Never    PHYSICAL EXAM  Vitals:   10/17/23 1441  BP: 110/66  Pulse: 62  SpO2: 97%  Weight: 130 lb 9.6 oz (59.2  kg)  Height: 5' 3 (1.6 m)    Body mass index is 23.13 kg/m.  Generalized: Well developed, in no acute distress,  Neurological examination  Mentation: Alert oriented to time, place, history taking. Follows all commands speech and language fluent Cranial nerve II-XII: Pupils were equal round reactive to light. poor vision to the right eye. Poor vision to right eye with exophoria. Facial sensation and strength were normal. Head turning and shoulder shrug were normal and symmetric.  No eye open or cheek puff weakness was noted. No ptosis. Motor: Good strength overall with generalized weakness seems appropriate for age, poor mobility of the right upper extremity due to shoulder, general body pain  Sensory: Sensory testing is intact to soft touch on all 4 extremities. No evidence of extinction is noted.  Coordination: Cerebellar testing reveals good finger-nose-finger and heel-to-shin bilaterally.  Gait and station: Push off to stand, Gait is wide-based, cautious, rolling walker Reflexes: Deep tendon reflexes are symmetric but decreased  DIAGNOSTIC DATA (LABS, IMAGING, TESTING) - I reviewed patient records, labs, notes, testing and imaging myself where available.  Lab Results  Component  Value Date   WBC 7.6 09/25/2023   HGB 11.7 (L) 09/25/2023   HCT 36.5 09/25/2023   MCV 99.5 09/25/2023   PLT 127 (L) 09/25/2023      Component Value Date/Time   NA 141 09/25/2023 1713   NA 142 01/12/2022 1137   K 4.1 09/25/2023 1713   CL 106 09/25/2023 1713   CO2 26 09/25/2023 1713   GLUCOSE 114 (H) 09/25/2023 1713   BUN 27 (H) 09/25/2023 1713   BUN 13 01/12/2022 1137   CREATININE 0.80 09/25/2023 1713   CREATININE 1.05 (H) 05/04/2015 1559   CALCIUM 9.8 09/25/2023 1713   PROT 6.2 (L) 09/25/2023 1713   PROT 6.2 01/12/2022 1137   ALBUMIN 3.5 09/25/2023 1713   ALBUMIN 4.2 01/12/2022 1137   AST 16 09/25/2023 1713   ALT 10 09/25/2023 1713   ALKPHOS 52 09/25/2023 1713   BILITOT 0.6 09/25/2023 1713   BILITOT 0.5 01/12/2022 1137   GFRNONAA >60 09/25/2023 1713   GFRAA 56 (L) 03/16/2020 1550   No results found for: CHOL, HDL, LDLCALC, LDLDIRECT, TRIG, CHOLHDL Lab Results  Component Value Date   HGBA1C 5.3 01/27/2017   Lab Results  Component Value Date   VITAMINB12 960 03/10/2019   Lab Results  Component Value Date   TSH 1.760 09/19/2017    Lauraine Born, SCHARLENE, DNP 10/17/2023, 4:05 PM [facility] [address] [place], [place] [address] [phone]

## 2023-10-18 ENCOUNTER — Ambulatory Visit: Payer: Self-pay | Admitting: Neurology

## 2023-10-18 ENCOUNTER — Telehealth: Payer: Self-pay

## 2023-10-18 LAB — COMPREHENSIVE METABOLIC PANEL WITH GFR
ALT: 10 IU/L (ref 0–32)
AST: 14 IU/L (ref 0–40)
Albumin: 3.9 g/dL (ref 3.7–4.7)
Alkaline Phosphatase: 69 IU/L (ref 44–121)
BUN/Creatinine Ratio: 27 (ref 12–28)
BUN: 24 mg/dL (ref 8–27)
Bilirubin Total: 0.4 mg/dL (ref 0.0–1.2)
CO2: 23 mmol/L (ref 20–29)
Calcium: 9.8 mg/dL (ref 8.7–10.3)
Chloride: 103 mmol/L (ref 96–106)
Creatinine, Ser: 0.9 mg/dL (ref 0.57–1.00)
Globulin, Total: 2.5 g/dL (ref 1.5–4.5)
Glucose: 88 mg/dL (ref 70–99)
Potassium: 4.1 mmol/L (ref 3.5–5.2)
Sodium: 141 mmol/L (ref 134–144)
Total Protein: 6.4 g/dL (ref 6.0–8.5)
eGFR: 63 mL/min/1.73 (ref >59)

## 2023-10-18 LAB — CBC WITH DIFFERENTIAL/PLATELET
Basophils Absolute: 0.1 x10E3/uL (ref 0.0–0.2)
Basos: 1 %
EOS (ABSOLUTE): 0.1 x10E3/uL (ref 0.0–0.4)
Eos: 1 %
Hematocrit: 41.5 % (ref 34.0–46.6)
Hemoglobin: 13.1 g/dL (ref 11.1–15.9)
Immature Grans (Abs): 0 x10E3/uL (ref 0.0–0.1)
Immature Granulocytes: 0 %
Lymphocytes Absolute: 1.8 x10E3/uL (ref 0.7–3.1)
Lymphs: 25 %
MCH: 32.3 pg (ref 26.6–33.0)
MCHC: 31.6 g/dL (ref 31.5–35.7)
MCV: 102 fL — ABNORMAL HIGH (ref 79–97)
Monocytes Absolute: 0.6 x10E3/uL (ref 0.1–0.9)
Monocytes: 8 %
Neutrophils Absolute: 4.5 x10E3/uL (ref 1.4–7.0)
Neutrophils: 65 %
Platelets: 145 x10E3/uL — ABNORMAL LOW (ref 150–450)
RBC: 4.06 x10E6/uL (ref 3.77–5.28)
RDW: 13 % (ref 11.7–15.4)
WBC: 6.9 x10E3/uL (ref 3.4–10.8)

## 2023-10-18 NOTE — Telephone Encounter (Signed)
 Faxed CPAP orders to [facility]

## 2023-10-26 NOTE — Progress Notes (Signed)
 Chart reviewed, agree above plan ?

## 2023-10-30 ENCOUNTER — Ambulatory Visit (INDEPENDENT_AMBULATORY_CARE_PROVIDER_SITE_OTHER): Payer: Medicare Other

## 2023-10-30 DIAGNOSIS — I495 Sick sinus syndrome: Secondary | ICD-10-CM | POA: Diagnosis not present

## 2023-10-31 LAB — CUP PACEART REMOTE DEVICE CHECK
Battery Remaining Longevity: 78 mo
Battery Voltage: 2.97 V
Brady Statistic AP VP Percent: 0.04 %
Brady Statistic AP VS Percent: 98.14 %
Brady Statistic AS VP Percent: 0 %
Brady Statistic AS VS Percent: 1.82 %
Brady Statistic RA Percent Paced: 98.35 %
Brady Statistic RV Percent Paced: 0.04 %
Date Time Interrogation Session: 20250812014356
Implantable Lead Connection Status: 753985
Implantable Lead Connection Status: 753985
Implantable Lead Implant Date: 20190501
Implantable Lead Implant Date: 20190501
Implantable Lead Location: 753859
Implantable Lead Location: 753860
Implantable Lead Model: 3830
Implantable Lead Model: 5076
Implantable Pulse Generator Implant Date: 20190501
Lead Channel Impedance Value: 323 Ohm
Lead Channel Impedance Value: 342 Ohm
Lead Channel Impedance Value: 380 Ohm
Lead Channel Impedance Value: 475 Ohm
Lead Channel Pacing Threshold Amplitude: 0.625 V
Lead Channel Pacing Threshold Amplitude: 0.75 V
Lead Channel Pacing Threshold Pulse Width: 0.4 ms
Lead Channel Pacing Threshold Pulse Width: 0.4 ms
Lead Channel Sensing Intrinsic Amplitude: 0.625 mV
Lead Channel Sensing Intrinsic Amplitude: 0.625 mV
Lead Channel Sensing Intrinsic Amplitude: 13 mV
Lead Channel Sensing Intrinsic Amplitude: 13 mV
Lead Channel Setting Pacing Amplitude: 2 V
Lead Channel Setting Pacing Amplitude: 2.5 V
Lead Channel Setting Pacing Pulse Width: 0.4 ms
Lead Channel Setting Sensing Sensitivity: 0.9 mV
Zone Setting Status: 755011
Zone Setting Status: 755011

## 2023-11-01 ENCOUNTER — Ambulatory Visit: Payer: Self-pay | Admitting: Internal Medicine

## 2023-12-13 NOTE — Progress Notes (Signed)
 Remote PPM Transmission

## 2024-01-02 ENCOUNTER — Encounter (INDEPENDENT_AMBULATORY_CARE_PROVIDER_SITE_OTHER): Payer: Self-pay | Admitting: Gastroenterology

## 2024-01-29 ENCOUNTER — Ambulatory Visit: Payer: Medicare Other

## 2024-01-29 DIAGNOSIS — I495 Sick sinus syndrome: Secondary | ICD-10-CM | POA: Diagnosis not present

## 2024-01-29 LAB — CUP PACEART REMOTE DEVICE CHECK
Battery Remaining Longevity: 77 mo
Battery Voltage: 2.98 V
Brady Statistic AP VP Percent: 0.04 %
Brady Statistic AP VS Percent: 69.24 %
Brady Statistic AS VP Percent: 0.01 %
Brady Statistic AS VS Percent: 30.71 %
Brady Statistic RA Percent Paced: 69.53 %
Brady Statistic RV Percent Paced: 0.05 %
Date Time Interrogation Session: 20251111004738
Implantable Lead Connection Status: 753985
Implantable Lead Connection Status: 753985
Implantable Lead Implant Date: 20190501
Implantable Lead Implant Date: 20190501
Implantable Lead Location: 753859
Implantable Lead Location: 753860
Implantable Lead Model: 3830
Implantable Lead Model: 5076
Implantable Pulse Generator Implant Date: 20190501
Lead Channel Impedance Value: 304 Ohm
Lead Channel Impedance Value: 323 Ohm
Lead Channel Impedance Value: 342 Ohm
Lead Channel Impedance Value: 456 Ohm
Lead Channel Pacing Threshold Amplitude: 0.5 V
Lead Channel Pacing Threshold Amplitude: 0.625 V
Lead Channel Pacing Threshold Pulse Width: 0.4 ms
Lead Channel Pacing Threshold Pulse Width: 0.4 ms
Lead Channel Sensing Intrinsic Amplitude: 0.75 mV
Lead Channel Sensing Intrinsic Amplitude: 0.75 mV
Lead Channel Sensing Intrinsic Amplitude: 11.875 mV
Lead Channel Sensing Intrinsic Amplitude: 11.875 mV
Lead Channel Setting Pacing Amplitude: 2 V
Lead Channel Setting Pacing Amplitude: 2.5 V
Lead Channel Setting Pacing Pulse Width: 0.4 ms
Lead Channel Setting Sensing Sensitivity: 0.9 mV
Zone Setting Status: 755011
Zone Setting Status: 755011

## 2024-02-01 NOTE — Progress Notes (Signed)
 Remote PPM Transmission

## 2024-02-03 ENCOUNTER — Ambulatory Visit: Payer: Self-pay | Admitting: Internal Medicine

## 2024-02-12 ENCOUNTER — Encounter: Payer: Self-pay | Admitting: Physician Assistant

## 2024-02-16 ENCOUNTER — Other Ambulatory Visit: Payer: Self-pay | Admitting: Cardiology

## 2024-04-24 ENCOUNTER — Ambulatory Visit (INDEPENDENT_AMBULATORY_CARE_PROVIDER_SITE_OTHER): Admitting: Gastroenterology

## 2024-04-29 ENCOUNTER — Ambulatory Visit: Payer: Medicare Other

## 2024-04-29 ENCOUNTER — Ambulatory Visit: Admitting: Neurology

## 2024-06-12 ENCOUNTER — Ambulatory Visit (INDEPENDENT_AMBULATORY_CARE_PROVIDER_SITE_OTHER): Admitting: Gastroenterology

## 2024-06-17 ENCOUNTER — Ambulatory Visit: Admitting: Rheumatology

## 2024-07-29 ENCOUNTER — Ambulatory Visit

## 2024-08-19 ENCOUNTER — Ambulatory Visit: Admitting: Hematology and Oncology

## 2024-10-28 ENCOUNTER — Ambulatory Visit

## 2025-01-27 ENCOUNTER — Ambulatory Visit

## 2025-04-28 ENCOUNTER — Ambulatory Visit

## 2025-07-28 ENCOUNTER — Ambulatory Visit

## 2025-10-27 ENCOUNTER — Ambulatory Visit

## 2026-01-26 ENCOUNTER — Ambulatory Visit

## 2026-04-27 ENCOUNTER — Ambulatory Visit
